# Patient Record
Sex: Female | Born: 1960
Health system: Southern US, Community
[De-identification: ages and names within clinical notes are randomized; demographics above are authoritative.]

## PROBLEM LIST (undated history)

## (undated) DIAGNOSIS — K219 Gastro-esophageal reflux disease without esophagitis: Secondary | ICD-10-CM

## (undated) DIAGNOSIS — M1990 Unspecified osteoarthritis, unspecified site: Secondary | ICD-10-CM

## (undated) DIAGNOSIS — G8929 Other chronic pain: Secondary | ICD-10-CM

## (undated) DIAGNOSIS — D689 Coagulation defect, unspecified: Secondary | ICD-10-CM

## (undated) DIAGNOSIS — M549 Dorsalgia, unspecified: Secondary | ICD-10-CM

## (undated) DIAGNOSIS — R531 Weakness: Secondary | ICD-10-CM

## (undated) DIAGNOSIS — F419 Anxiety disorder, unspecified: Secondary | ICD-10-CM

## (undated) DIAGNOSIS — I10 Essential (primary) hypertension: Secondary | ICD-10-CM

## (undated) DIAGNOSIS — G4700 Insomnia, unspecified: Secondary | ICD-10-CM

## (undated) DIAGNOSIS — D649 Anemia, unspecified: Secondary | ICD-10-CM

## (undated) DIAGNOSIS — E785 Hyperlipidemia, unspecified: Secondary | ICD-10-CM

## (undated) DIAGNOSIS — F32A Depression, unspecified: Secondary | ICD-10-CM

## (undated) DIAGNOSIS — K449 Diaphragmatic hernia without obstruction or gangrene: Secondary | ICD-10-CM

## (undated) DIAGNOSIS — Z8709 Personal history of other diseases of the respiratory system: Secondary | ICD-10-CM

## (undated) DIAGNOSIS — M2550 Pain in unspecified joint: Secondary | ICD-10-CM

## (undated) DIAGNOSIS — M797 Fibromyalgia: Secondary | ICD-10-CM

## (undated) DIAGNOSIS — R0609 Other forms of dyspnea: Secondary | ICD-10-CM

## (undated) DIAGNOSIS — Z923 Personal history of irradiation: Secondary | ICD-10-CM

## (undated) DIAGNOSIS — C50919 Malignant neoplasm of unspecified site of unspecified female breast: Secondary | ICD-10-CM

## (undated) DIAGNOSIS — Z86718 Personal history of other venous thrombosis and embolism: Secondary | ICD-10-CM

## (undated) DIAGNOSIS — M2540 Effusion, unspecified joint: Secondary | ICD-10-CM

## (undated) HISTORY — DX: Coagulation defect, unspecified: D68.9

## (undated) HISTORY — PX: SHOULDER ARTHROSCOPY: SHX128

## (undated) HISTORY — PX: BREAST BIOPSY: SHX20

## (undated) HISTORY — PX: HERNIA REPAIR: SHX51

## (undated) HISTORY — DX: Fibromyalgia: M79.7

## (undated) HISTORY — PX: BREAST LUMPECTOMY: SHX2

---

## 1997-09-18 ENCOUNTER — Ambulatory Visit (HOSPITAL_COMMUNITY): Admission: RE | Admit: 1997-09-18 | Discharge: 1997-09-18 | Payer: Self-pay | Admitting: Internal Medicine

## 1997-12-28 ENCOUNTER — Other Ambulatory Visit: Admission: RE | Admit: 1997-12-28 | Discharge: 1997-12-28 | Payer: Self-pay | Admitting: Obstetrics and Gynecology

## 1999-01-16 ENCOUNTER — Other Ambulatory Visit: Admission: RE | Admit: 1999-01-16 | Discharge: 1999-01-16 | Payer: Self-pay | Admitting: Obstetrics and Gynecology

## 1999-03-18 ENCOUNTER — Encounter: Payer: Self-pay | Admitting: Emergency Medicine

## 1999-03-18 ENCOUNTER — Emergency Department (HOSPITAL_COMMUNITY): Admission: EM | Admit: 1999-03-18 | Discharge: 1999-03-18 | Payer: Self-pay | Admitting: Emergency Medicine

## 2000-06-05 ENCOUNTER — Emergency Department (HOSPITAL_COMMUNITY): Admission: EM | Admit: 2000-06-05 | Discharge: 2000-06-05 | Payer: Self-pay | Admitting: *Deleted

## 2000-09-07 ENCOUNTER — Inpatient Hospital Stay (HOSPITAL_COMMUNITY): Admission: AD | Admit: 2000-09-07 | Discharge: 2000-09-07 | Payer: Self-pay | Admitting: Obstetrics & Gynecology

## 2002-01-13 ENCOUNTER — Ambulatory Visit (HOSPITAL_BASED_OUTPATIENT_CLINIC_OR_DEPARTMENT_OTHER): Admission: RE | Admit: 2002-01-13 | Discharge: 2002-01-13 | Payer: Self-pay | Admitting: Orthopedic Surgery

## 2002-01-13 HISTORY — PX: GANGLION CYST EXCISION: SHX1691

## 2002-02-14 ENCOUNTER — Other Ambulatory Visit: Admission: RE | Admit: 2002-02-14 | Discharge: 2002-02-14 | Payer: Self-pay | Admitting: Obstetrics and Gynecology

## 2003-03-15 ENCOUNTER — Encounter: Admission: RE | Admit: 2003-03-15 | Discharge: 2003-03-15 | Payer: Self-pay | Admitting: Orthopedic Surgery

## 2003-05-10 ENCOUNTER — Other Ambulatory Visit: Admission: RE | Admit: 2003-05-10 | Discharge: 2003-05-10 | Payer: Self-pay | Admitting: Obstetrics and Gynecology

## 2003-05-30 ENCOUNTER — Ambulatory Visit (HOSPITAL_COMMUNITY): Admission: RE | Admit: 2003-05-30 | Discharge: 2003-05-30 | Payer: Self-pay | Admitting: Orthopedic Surgery

## 2003-06-01 ENCOUNTER — Encounter: Admission: RE | Admit: 2003-06-01 | Discharge: 2003-08-08 | Payer: Self-pay | Admitting: Orthopedic Surgery

## 2004-04-08 ENCOUNTER — Inpatient Hospital Stay (HOSPITAL_COMMUNITY): Admission: RE | Admit: 2004-04-08 | Discharge: 2004-04-12 | Payer: Self-pay | Admitting: Orthopedic Surgery

## 2004-04-08 ENCOUNTER — Ambulatory Visit: Payer: Self-pay | Admitting: Physical Medicine & Rehabilitation

## 2004-04-08 HISTORY — PX: TOTAL KNEE ARTHROPLASTY: SHX125

## 2004-08-27 ENCOUNTER — Inpatient Hospital Stay (HOSPITAL_COMMUNITY): Admission: AD | Admit: 2004-08-27 | Discharge: 2004-08-27 | Payer: Self-pay | Admitting: *Deleted

## 2004-08-28 ENCOUNTER — Encounter: Admission: RE | Admit: 2004-08-28 | Discharge: 2004-08-28 | Payer: Self-pay | Admitting: *Deleted

## 2005-10-08 ENCOUNTER — Other Ambulatory Visit: Admission: RE | Admit: 2005-10-08 | Discharge: 2005-10-08 | Payer: Self-pay | Admitting: Obstetrics and Gynecology

## 2006-01-16 ENCOUNTER — Encounter: Admission: RE | Admit: 2006-01-16 | Discharge: 2006-01-16 | Payer: Self-pay | Admitting: Obstetrics and Gynecology

## 2006-04-23 ENCOUNTER — Encounter: Admission: RE | Admit: 2006-04-23 | Discharge: 2006-07-22 | Payer: Self-pay | Admitting: Sports Medicine

## 2006-06-17 ENCOUNTER — Encounter: Admission: RE | Admit: 2006-06-17 | Discharge: 2006-06-17 | Payer: Self-pay | Admitting: Sports Medicine

## 2006-07-01 ENCOUNTER — Encounter: Admission: RE | Admit: 2006-07-01 | Discharge: 2006-07-01 | Payer: Self-pay | Admitting: Sports Medicine

## 2006-07-31 ENCOUNTER — Encounter: Admission: RE | Admit: 2006-07-31 | Discharge: 2006-07-31 | Payer: Self-pay | Admitting: Obstetrics and Gynecology

## 2007-02-11 ENCOUNTER — Emergency Department (HOSPITAL_COMMUNITY): Admission: EM | Admit: 2007-02-11 | Discharge: 2007-02-11 | Payer: Self-pay | Admitting: Emergency Medicine

## 2007-02-17 ENCOUNTER — Emergency Department (HOSPITAL_COMMUNITY): Admission: EM | Admit: 2007-02-17 | Discharge: 2007-02-17 | Payer: Self-pay | Admitting: Emergency Medicine

## 2007-02-23 ENCOUNTER — Emergency Department (HOSPITAL_COMMUNITY): Admission: EM | Admit: 2007-02-23 | Discharge: 2007-02-23 | Payer: Self-pay | Admitting: Emergency Medicine

## 2007-12-29 ENCOUNTER — Ambulatory Visit (HOSPITAL_COMMUNITY): Admission: RE | Admit: 2007-12-29 | Discharge: 2007-12-29 | Payer: Self-pay | Admitting: Orthopaedic Surgery

## 2007-12-29 ENCOUNTER — Encounter (INDEPENDENT_AMBULATORY_CARE_PROVIDER_SITE_OTHER): Payer: Self-pay | Admitting: Orthopaedic Surgery

## 2007-12-29 ENCOUNTER — Ambulatory Visit: Payer: Self-pay | Admitting: Vascular Surgery

## 2007-12-30 ENCOUNTER — Encounter: Admission: RE | Admit: 2007-12-30 | Discharge: 2007-12-30 | Payer: Self-pay | Admitting: Family Medicine

## 2008-01-06 ENCOUNTER — Ambulatory Visit (HOSPITAL_BASED_OUTPATIENT_CLINIC_OR_DEPARTMENT_OTHER): Admission: RE | Admit: 2008-01-06 | Discharge: 2008-01-06 | Payer: Self-pay | Admitting: Orthopedic Surgery

## 2008-09-05 ENCOUNTER — Encounter: Admission: RE | Admit: 2008-09-05 | Discharge: 2008-09-05 | Payer: Self-pay | Admitting: Obstetrics and Gynecology

## 2009-01-28 ENCOUNTER — Emergency Department (HOSPITAL_COMMUNITY): Admission: EM | Admit: 2009-01-28 | Discharge: 2009-01-28 | Payer: Self-pay | Admitting: Family Medicine

## 2009-02-12 ENCOUNTER — Encounter: Admission: RE | Admit: 2009-02-12 | Discharge: 2009-02-12 | Payer: Self-pay | Admitting: Obstetrics and Gynecology

## 2010-02-20 ENCOUNTER — Encounter: Admission: RE | Admit: 2010-02-20 | Discharge: 2010-02-20 | Payer: Self-pay | Admitting: Obstetrics and Gynecology

## 2010-06-02 ENCOUNTER — Encounter: Payer: Self-pay | Admitting: Obstetrics and Gynecology

## 2010-06-02 ENCOUNTER — Encounter: Payer: Self-pay | Admitting: Sports Medicine

## 2010-06-03 ENCOUNTER — Encounter: Payer: Self-pay | Admitting: Family Medicine

## 2010-07-29 ENCOUNTER — Inpatient Hospital Stay (INDEPENDENT_AMBULATORY_CARE_PROVIDER_SITE_OTHER)
Admission: RE | Admit: 2010-07-29 | Discharge: 2010-07-29 | Disposition: A | Discharge status: Home / Self Care | Payer: Self-pay | Source: Ambulatory Visit | Attending: Family Medicine | Admitting: Family Medicine

## 2010-07-29 DIAGNOSIS — J069 Acute upper respiratory infection, unspecified: Secondary | ICD-10-CM

## 2010-08-16 LAB — DIFFERENTIAL
Eosinophils Absolute: 0.1 10*3/uL (ref 0.0–0.7)
Lymphs Abs: 3.1 10*3/uL (ref 0.7–4.0)
Monocytes Absolute: 0.9 10*3/uL (ref 0.1–1.0)
Monocytes Relative: 8 % (ref 3–12)
Neutrophils Relative %: 61 % (ref 43–77)

## 2010-08-16 LAB — POCT I-STAT, CHEM 8
BUN: 20 mg/dL (ref 6–23)
Chloride: 105 mEq/L (ref 96–112)
Creatinine, Ser: 1 mg/dL (ref 0.4–1.2)
Glucose, Bld: 77 mg/dL (ref 70–99)
Hemoglobin: 12.6 g/dL (ref 12.0–15.0)
Potassium: 4 mEq/L (ref 3.5–5.1)
Sodium: 137 mEq/L (ref 135–145)

## 2010-08-16 LAB — CBC
Hemoglobin: 11.8 g/dL — ABNORMAL LOW (ref 12.0–15.0)
MCHC: 33.1 g/dL (ref 30.0–36.0)
MCV: 93.2 fL (ref 78.0–100.0)
RBC: 3.82 MIL/uL — ABNORMAL LOW (ref 3.87–5.11)
WBC: 11.1 10*3/uL — ABNORMAL HIGH (ref 4.0–10.5)

## 2010-09-24 NOTE — Op Note (Signed)
NAMEMAICIE, VANDERLOOP NO.:  [id number]   MEDICAL RECORD NO.:  [id number]          PATIENT TYPE:  AMB   LOCATION:  DSC                          FACILITY:  [facility]   PHYSICIAN:  Loreta Ave, M.D. DATE OF BIRTH:  28-Dec-1960   DATE OF PROCEDURE:  01/06/2008  DATE OF DISCHARGE:                               OPERATIVE REPORT   PREOPERATIVE DIAGNOSIS:  Left carpal tunnel syndrome.   POSTOPERATIVE DIAGNOSIS:  Left carpal tunnel syndrome.   PROCEDURE:  Left carpal tunnel release.   SURGEON:  Loreta Ave, MD   ASSISTANT:  Genene Churn. Barry Dienes, [place]   ANESTHESIA:  General.   BLOOD LOSS:  Minimal.   SPECIMENS:  None.   CULTURES:  None.   COMPLICATIONS:  None.   DRESSING:  Soft compressive with splint.   PROCEDURE:  The patient was brought to the operating room and placed on  operating table in supine position.  After adequate anesthesia had been  obtained, tourniquet applied to the upper aspect of the arm.  Prepped  and draped in usual sterile fashion.  Exsanguinated with elevation and  Esmarch.  Tourniquet inflated to 250 mmHg.  A longitudinal incision over  the carpal tunnel heading slightly ulnarward at the wrist crease.  Skin  and subcutaneous tissue divided.  Retinaculum over the carpal tunnel  identified and incised under direct visualization from the forearm  fascia proximally to the palmar arch distally.  Nerve identified,  completely decompressed.  Digital branch and motor branches identified  and all decompressed.  No other abnormalities in the canal.  Relatively  tight, but the nerve itself had fairly good appearance.  Wound  irrigated.  Skin closed with nylon.  Sterile compressive dressing  applied with a splint.  Tourniquet deflated, removed.  Anesthesia  reversed.  Brought to the recovery room.  Tolerated surgery well.  No  complications.     Loreta Ave, M.D.  Electronically Signed    DFM/MEDQ  D:  01/06/2008  T:  01/07/2008  Job:   [id number]

## 2010-09-27 NOTE — Discharge Summary (Signed)
[name], AGOSTINELLI NO.:  [id number]   MEDICAL RECORD NO.:  [id number]          PATIENT TYPE:  INP   LOCATION:  5035                         FACILITY:  [facility]   PHYSICIAN:  Genene Churn. Barry Dienes, P.A.   DATE OF BIRTH:  1960-10-08   DATE OF ADMISSION:  04/08/2004  DATE OF DISCHARGE:  04/12/2004                           DISCHARGE SUMMARY - REFERRING   FINAL DIAGNOSES:  1.  Status post left total knee replacement for osteoarthritis and chronic      pain.  2.  Long-term use of anticoagulants.   SURGEON:  Loreta Ave, M.D.   HISTORY OF PRESENT ILLNESS:  A 50 year old white female with history of left  knee osteoarthritis and chronic pain presented to our office for  preoperative evaluation for left total knee replacement.  She had  progressively worsening pain with failed response to conservative treatment.  Significant decrease in her daily activities due to the ongoing complaint.  Preoperative x-ray showed a 50% decrease in joint space, height medial  compartment and patellofemoral joint.   PREADMISSION LABS:  A wbc was 10.1, rbc 4.14, hemoglobin 12.6, hematocrit  37.0, platelets 336.  PT 12.5, INR 0.9, PTT 27.  Sodium 136, potassium 4.5,  chloride 106, CO2 25, glucose 82, BUN 10, creatinine 0.9, calcium 9.2, total  protein 6.6, albumin 3.7, AST 16, ALT 15, alkaline phosphatase 72, total  bilirubin 0.5.   HOSPITAL COURSE:  April 09, 2004, the patient was taken to the [facility] Operating Room and a left total knee replacement  procedure was performed.  Surgeon, Loreta Ave, M.D., and assistant was  Genene Churn. Barry Dienes, P.A.C.  Anesthesia was general. Estimated blood loss was  minimal.  Tourniquet time was 80 minutes.  There were no surgical or  anesthesia complications and the patient was transferred to recovery in  stable condition.   On April 09, 2004, the patient started on pharmacy protocol Coumadin.  Complained of nausea and  knee pain.  Temperature 98.6, pulse 99, respiratory  rate 20, blood pressure 106/70.  T-max 101.  Hemoglobin 9.3.  INR 1.1, PT  13.9.  Lytes stable, glucose 125.  Dressing bloody.  She is neurovascularly  intact.  Encouraged incentive spirometer.  In the chair b.i.d. with  assistance.  Morphine and O2 discontinued.   April 10, 2004, patient complained of a nonproductive cough.  Question of  some post nasal drainage.  No chest pain or shortness of breath.  Temperature 99.2, pulse 112, respiratory rate 20, blood pressure 115/70.  Hemoglobin 9.3.  INR 1.5.  Lytes stable.  Wound looked good.  Staples  intact.  No signs of infection.  Calf nontender.  Neurovascularly intact.  Hemovac drain pulled.  Foley discontinued and IV heplocked.  Chest x-ray  ordered due to patient's cough.  Discontinued Percocet and Dilaudid.  Started on Mepergan forte one tablet p.o. q.4-6h. p.r.n.   April 11, 2004, the patient continued to complain of some knee pain.  Cough improved.  Temperature 100.2, pulse 102, respiratory rate 20, blood  pressure 113/69.  Potassium 3.1.  Hemoglobin 9.5.  INR 1.7.  Chest x-ray  showed no acute disease.  Wound looked good.  Staples intact.  No signs of  infection.  Calf nontender.  Neurovascularly intact.  KCl 20 mEq p.o. x1  day.  DCIV.  Patient slow moving with physical therapy and anticipated short  rehab stay.  Check UA, urine C&S.   April 12, 2004, the patient doing better.  Ambulated down the hall.  Temperature 99.2, pulse 96, respiratory rate 20, blood pressure 129/74.  UA  negative showing trace ketones.  INR 1.5.  Hemoglobin 9.1.  Calcium 3.3.  Wound looked good.  No signs of infection.  Patient stable and ready for  discharge home.   DISCHARGE MEDICATIONS:  1.  Mepergan forte one tablet p.o. q.4-6h. p.r.n. for pain.  2.  Skelaxin 800 mg one tablet p.o. q.12h. p.r.n. spasms.  3.  Coumadin pharmacy protocol.   CONDITION:  Good and stable.   DISPOSITION:   Discharge home.   DISCHARGE INSTRUCTIONS:  The patient have work with Home Health PT to  improve knee range of motion and strengthening.  Coumadin for DVT  prophylaxis postoperatively x3 or 4 weeks. Staples to be removed two weeks  postoperatively.  Dressing changes p.r.n.  She will follow up in my office  in two weeks from the date of her surgery for recheck.  Will return sooner  p.r.n.       JMO/MEDQ  D:  05/31/2004  T:  05/31/2004  Job:  [id number]

## 2010-09-27 NOTE — Op Note (Signed)
   [name], [name]                             ACCOUNT NO.:  [id number]   MEDICAL RECORD NO.:  [id number]                   PATIENT TYPE:  AMB   LOCATION:  DSC                                  FACILITY:  [facility]   PHYSICIAN:  Loreta Ave, M.D.              DATE OF BIRTH:  1961-04-01   DATE OF PROCEDURE:  01/13/2002  DATE OF DISCHARGE:                                 OPERATIVE REPORT   PREOPERATIVE DIAGNOSES:  Volar ganglion, symptomatic, left wrist.   POSTOPERATIVE DIAGNOSES:  Volar ganglion, symptomatic, left wrist.   OPERATIVE PROCEDURE:  Excision of volar ganglion, left wrist.   SURGEON:  Loreta Ave, M.D.   ASSISTANT:  Arlys John D. Petrarca, P.A.-C.   ANESTHESIA:  General.   ESTIMATED BLOOD LOSS:  Minimal.   TOURNIQUET TIME:  30 minutes.   SPECIMENS:  None.   CULTURES:  None.   COMPLICATIONS:  None.   DRESSINGS:  Soft compressive with bulky hand dressing and splint.   PROCEDURE:  The patient was brought to the operating room and placed on the  operating table in the supine position.  After adequate anesthesia had been  obtained, the left arm was prepped and draped in the usual sterile fashion  with a tourniquet in place.  Exsanguinated with elevation of an Esmarch.  The tourniquet was inflated to 250 mmHg.  The ganglion which was at the  volar radial aspect of the wrist was approached with a longitudinal  incision, curving around the ganglion itself.  The skin and subcutaneous  tissue were divided. Ganglion was evident and dissected down to the volar  wrist capsule, excised in its entirity, including a smalentirety in the  capsule.  The radial artery was protected throughout and maintained intact.  No other abnormalities were seen except for another small satellite ganglion  just adjacent to the original one.  This was excised as well.  The wound was  irrigated.  The skin was closed with nylon.  The margins were injected with  Marcaine.  A sterile compressive  dressing was applied with a bulky hand  dressing and splint.  The tourniquet was inflated and removed.  He tolerated  surgery well without complications.                                               Loreta Ave, M.D.   DFM/MEDQ  D:  01/13/2002  T:  01/14/2002  Job:  [id number]

## 2010-09-27 NOTE — Op Note (Signed)
NAMEJALYSA, SWOPES NO.:  [id number]   MEDICAL RECORD NO.:  [id number]          PATIENT TYPE:  INP   LOCATION:  5035                         FACILITY:  [facility]   PHYSICIAN:  Loreta Ave, M.D. DATE OF BIRTH:  1960/12/25   DATE OF PROCEDURE:  04/08/2004  DATE OF DISCHARGE:                                 OPERATIVE REPORT   PREOPERATIVE DIAGNOSIS:  End-stage degenerative arthritis, left knee.   POSTOPERATIVE DIAGNOSIS:  End-stage degenerative arthritis, left knee.   OPERATION PERFORMED:  Left total knee replacement, [employer] prosthesis,  cemented #7 posterior stabilizer femoral component, cemented #5 tibial  component with a 10-millimeter posterior stabilizer Flex insert, cemented  recessed 26-millimeter patellar component.   SURGEON:  Loreta Ave, M.D.   ASSISTANT:  Genene Churn. Owens, P. A.   ANESTHESIA:  General.   ESTIMATED BLOOD LOSS:  Blood loss is minimal.   TOURNIQUET TIME:  One hour 15 minutes.   SPECIMENS:  Excised bone and soft tissue.   CULTURES:  None.   COMPLICATIONS:  None.   DRESSINGS:  Soft compressive.   DRAINS:  Hemovac times two.   DESCRIPTION OF OPERATION:  The patient was brought to the operating room and  after adequate anesthesia was obtained the left knee was examined.  Slight  hyperextension, flexion to 125 degrees.  Stable ligaments.   The tourniquet was applied.  The patient was prepped and draped in the usual  sterile fashion.  The limb was exsanguinated with elevation and an Esmarch.  The tourniquet was inflated to 350 mmHg.  A straight incision was made above  the patella and carried down to the tibial tubercle.  A median parapatellar  arthrotomy performed.  Hemostasis ws with cautery.  The knee was exposed.  Grade 4 changes were noted medially.  There were some focal grade 3 changes  in the other two compartments.  Periarticular spurs, loose bodies, remnants  of menisci and cruciate ligament were removed.  The  distal femur was  exposed.  Intramedullary guide was placed.  A distal cut was made removing  10 mm.  Knee was set five degrees of valgus; sized for a #7 component.  Jig  was put in place.  Significant cuts were made.   Attention was turned to the tibia.  The tibial spur was removed with a saw.  The intramedullary guide was put into place.  A proximal cut was made, 5-  degree  posterior slope removing 6 mm off the deficit medial side.  It was  sized for a #5 component.  Patellar was sized, reamed and drilled for a 26  mm patellar component.  Trial was put in place; #7 on the femur, #5 on the  tibia and a 26 mm on the patella with a 10-mm polyethylene insert.  Full  extension, full flexion and after a medial capsular release I had a nicely  balanced knee with no lift off in flexion and good stability in flexion  extension.  The tibia was marked for rotation and then hand reamed.   All trials were removed.  Copious irrigation with a pulse irrigating device  was done.  Cement was prepared and placed on all components, which were  firmly seated.  Excess cement was removed.  Polyethylene was attached to the  tibia.  The knee was reduced.  Once the knee had been allowed to stay until  the cement hardened.  It was reexamined.  Full extension, flexion and no  lift off in flexion was noted.  Good patellofemoral tracking was noted.  Hemovacs were placed and brought out through a  separate stab wound.   Arthrotomy was closed with #1 Vicryl, skin and subcutaneous tissue with  Vicryl and staples.  The knee was injected with Marcaine.  Hemovacs were  clamped.  A sterile compressive dressing applied.   The tourniquet and inflator removed.  Knee immobilizer was applied.   Anesthesia was reversed and the patient was taken to the recovery room.  The  patient tolerated the surgery well with no complications.      Valentino Saxon   DFM/MEDQ  D:  04/08/2004  T:  04/09/2004  Job:  [id number]

## 2011-03-04 ENCOUNTER — Other Ambulatory Visit: Payer: Self-pay | Admitting: Obstetrics and Gynecology

## 2011-03-04 DIAGNOSIS — Z1231 Encounter for screening mammogram for malignant neoplasm of breast: Secondary | ICD-10-CM

## 2011-03-10 ENCOUNTER — Ambulatory Visit
Admission: RE | Admit: 2011-03-10 | Discharge: 2011-03-10 | Disposition: A | Discharge status: Home / Self Care | Payer: Self-pay | Source: Ambulatory Visit | Attending: Obstetrics and Gynecology | Admitting: Obstetrics and Gynecology

## 2011-03-10 DIAGNOSIS — Z1231 Encounter for screening mammogram for malignant neoplasm of breast: Secondary | ICD-10-CM

## 2012-10-27 ENCOUNTER — Emergency Department (INDEPENDENT_AMBULATORY_CARE_PROVIDER_SITE_OTHER): Payer: Self-pay

## 2012-10-27 ENCOUNTER — Encounter (HOSPITAL_COMMUNITY): Payer: Self-pay

## 2012-10-27 ENCOUNTER — Emergency Department (HOSPITAL_COMMUNITY)
Admission: EM | Admit: 2012-10-27 | Discharge: 2012-10-27 | Disposition: A | Discharge status: Home / Self Care | Payer: Self-pay | Source: Home / Self Care | Attending: Family Medicine | Admitting: Family Medicine

## 2012-10-27 DIAGNOSIS — J019 Acute sinusitis, unspecified: Secondary | ICD-10-CM

## 2012-10-27 MED ORDER — HYDROCOD POLST-CHLORPHEN POLST 10-8 MG/5ML PO LQCR: 5.0000 mL | Freq: Two times a day (BID) | ORAL | Status: DC | PRN

## 2012-10-27 MED ORDER — FLUTICASONE PROPIONATE 50 MCG/ACT NA SUSP: 1.0000 | Freq: Two times a day (BID) | NASAL | Status: DC

## 2012-10-27 MED ORDER — AMOXICILLIN-POT CLAVULANATE 875-125 MG PO TABS: 1.0000 | ORAL_TABLET | Freq: Two times a day (BID) | ORAL | Status: DC

## 2012-10-27 NOTE — ED Provider Notes (Signed)
History     CSN: [id number]  Arrival date & time 10/27/12  1611   First MD Initiated Contact with Patient 10/27/12 1656      Chief Complaint  Patient presents with  . Cough    (Consider location/radiation/quality/duration/timing/severity/associated sxs/prior treatment) Patient is a 52 y.o. female presenting with cough. The history is provided by the patient.  Cough Cough characteristics:  Non-productive Severity:  Moderate Duration:  4 days Timing:  Constant Progression:  Unchanged Chronicity:  New Smoker: no   Relieved by:  None tried Worsened by:  Nothing tried Ineffective treatments:  None tried Associated symptoms: chills and rhinorrhea   Associated symptoms: no fever, no shortness of breath and no wheezing     History reviewed. No pertinent past medical history.  History reviewed. No pertinent past surgical history.  History reviewed. No pertinent family history.  History  Substance Use Topics  . Smoking status: Not on file  . Smokeless tobacco: Not on file  . Alcohol Use: Not on file    OB History   Grav Para Term Preterm Abortions TAB SAB Ect Mult Living                  Review of Systems  Constitutional: Positive for chills. Negative for fever.  HENT: Positive for rhinorrhea.   Respiratory: Positive for cough. Negative for shortness of breath and wheezing.   Cardiovascular: Negative.   Gastrointestinal: Negative.   Genitourinary: Negative.     Allergies  Review of patient's allergies indicates not on file.  Home Medications   Current Outpatient Rx  Name  Route  Sig  Dispense  Refill  . amoxicillin-clavulanate (AUGMENTIN) 875-125 MG per tablet   Oral   Take 1 tablet by mouth 2 (two) times daily.   20 tablet   0   . chlorpheniramine-HYDROcodone (TUSSIONEX PENNKINETIC ER) 10-8 MG/5ML LQCR   Oral   Take 5 mLs by mouth every 12 (twelve) hours as needed. For cough   115 mL   0   . fluticasone (FLONASE) 50 MCG/ACT nasal spray   Nasal  Place 1 spray into the nose 2 (two) times daily.   1 g   2     BP 147/71  Pulse 93  Temp(Src) 98.4 F (36.9 C) (Oral)  SpO2 95%  Physical Exam  Nursing note and vitals reviewed. Constitutional: She is oriented to person, place, and time. She appears well-developed and well-nourished.  HENT:  Head: Normocephalic.  Right Ear: External ear normal.  Left Ear: External ear normal.  Nose: Mucosal edema and rhinorrhea present.  Mouth/Throat: Oropharynx is clear and moist.  Eyes: Conjunctivae are normal. Pupils are equal, round, and reactive to light.  Neck: Normal range of motion. Neck supple.  Cardiovascular: Normal rate, normal heart sounds and intact distal pulses.   Pulmonary/Chest: Effort normal and breath sounds normal.  Abdominal: Soft. Bowel sounds are normal. There is no tenderness.  Lymphadenopathy:    She has no cervical adenopathy.  Neurological: She is alert and oriented to person, place, and time.  Skin: Skin is warm and dry.    ED Course  Procedures (including critical care time)  Labs Reviewed - No data to display Dg Chest 2 View  10/27/2012   *RADIOLOGY REPORT*  Clinical Data: 52 year old female with cough.  Difficulty talking.  CHEST - 2 VIEW  Comparison: 04/10/2004.And earlier.  Findings: Lung volumes are stable within normal limits.  Cardiac size and mediastinal contours are within normal limits.  Visualized tracheal air  column is within normal limits.  No pneumothorax, pulmonary edema, pleural effusion or confluent pulmonary opacity. No acute osseous abnormality identified.  IMPRESSION: No acute cardiopulmonary abnormality.   Original Report Authenticated By: Erskine Speed, M.D.     1. Sinusitis, acute       MDM  X-rays reviewed and report per radiologist.         Linna Hoff, MD 10/27/12 [phone]

## 2012-10-27 NOTE — ED Notes (Signed)
States she has been having a cough since woke up early Saturday AM. C/o has no appetite, chest sore from coughing

## 2012-10-29 NOTE — ED Notes (Signed)
Accessed chart for pharmacy-[facility]- tussionex is too expensive, pharmacy requesting to substitute hydromet, dr Alfonse Ras agreed to pharmacy substitution

## 2013-02-09 ENCOUNTER — Other Ambulatory Visit: Payer: Self-pay | Admitting: Obstetrics and Gynecology

## 2013-02-09 DIAGNOSIS — Z1231 Encounter for screening mammogram for malignant neoplasm of breast: Secondary | ICD-10-CM

## 2013-03-01 ENCOUNTER — Encounter (HOSPITAL_COMMUNITY): Payer: Self-pay

## 2013-03-01 ENCOUNTER — Ambulatory Visit (HOSPITAL_COMMUNITY)
Admission: RE | Admit: 2013-03-01 | Discharge: 2013-03-01 | Disposition: A | Discharge status: Home / Self Care | Payer: No Typology Code available for payment source | Source: Ambulatory Visit | Attending: Obstetrics and Gynecology | Admitting: Obstetrics and Gynecology

## 2013-03-01 VITALS — BP 132/94 | Temp 98.2°F | Ht 64.0 in | Wt 209.4 lb

## 2013-03-01 DIAGNOSIS — Z1231 Encounter for screening mammogram for malignant neoplasm of breast: Secondary | ICD-10-CM

## 2013-03-01 DIAGNOSIS — Z01419 Encounter for gynecological examination (general) (routine) without abnormal findings: Secondary | ICD-10-CM

## 2013-03-01 NOTE — Patient Instructions (Signed)
Taught [name] how to perform BSE and gave educational materials to take home. Let her know [facility] will cover Pap smears every 3 years unless has a history of abnormal Pap smears. Let patient know will follow up with her within the next couple weeks with results by letter or phone. Terrence Dupont Hemrick verbalized understanding. Patient escorted to mammography for a screening mammogram.  Najma Bozarth, Kathaleen Maser, RN 3:52 PM

## 2013-03-01 NOTE — Progress Notes (Signed)
No complaints today.  Pap Smear:    Pap smear completed today. Patients last Pap smear was 6 years ago and normal per patient. Per patient has no history of an abnormal Pap smear. No Pap smear results in EPIC.  Physical exam: Breasts Breasts symmetrical. No skin abnormalities bilateral breasts. No nipple retraction bilateral breasts. No nipple discharge bilateral breasts. No lymphadenopathy. No lumps palpated bilateral breasts. No complaints of pain or tenderness on exam. Patient escorted to mammography for a screening mammogram.         Pelvic/Bimanual   Ext Genitalia No lesions, no swelling and no discharge observed on external genitalia.         Vagina Vagina pink and normal texture. No lesions or discharge observed in vagina.          Cervix Cervix is present. Cervix pink and of normal texture. No discharge observed.     Uterus Uterus is present and palpable. Uterus in normal position and normal size.        Adnexae Bilateral ovaries present and palpable. No tenderness on palpation.          Rectovaginal No rectal exam completed today since patient had no rectal complaints. No skin abnormalities observed on exam.

## 2013-03-07 ENCOUNTER — Other Ambulatory Visit: Payer: Self-pay | Admitting: Obstetrics and Gynecology

## 2013-03-07 DIAGNOSIS — R928 Other abnormal and inconclusive findings on diagnostic imaging of breast: Secondary | ICD-10-CM

## 2013-03-11 ENCOUNTER — Telehealth (HOSPITAL_COMMUNITY): Payer: Self-pay | Admitting: *Deleted

## 2013-03-11 NOTE — Telephone Encounter (Signed)
Telephoned patient at home # and discussed negative pap smear results. Next pap smear due in 3 years. Patient voiced understanding.  

## 2013-03-25 ENCOUNTER — Ambulatory Visit
Admission: RE | Admit: 2013-03-25 | Discharge: 2013-03-25 | Disposition: A | Discharge status: Home / Self Care | Payer: No Typology Code available for payment source | Source: Ambulatory Visit | Attending: Obstetrics and Gynecology | Admitting: Obstetrics and Gynecology

## 2013-03-25 ENCOUNTER — Other Ambulatory Visit: Payer: Self-pay | Admitting: Obstetrics and Gynecology

## 2013-03-25 DIAGNOSIS — R928 Other abnormal and inconclusive findings on diagnostic imaging of breast: Secondary | ICD-10-CM

## 2013-03-28 ENCOUNTER — Other Ambulatory Visit: Payer: Self-pay

## 2013-03-28 ENCOUNTER — Other Ambulatory Visit: Payer: Self-pay | Admitting: Obstetrics and Gynecology

## 2013-03-28 DIAGNOSIS — R928 Other abnormal and inconclusive findings on diagnostic imaging of breast: Secondary | ICD-10-CM

## 2013-03-29 ENCOUNTER — Ambulatory Visit
Admission: RE | Admit: 2013-03-29 | Discharge: 2013-03-29 | Disposition: A | Discharge status: Home / Self Care | Payer: No Typology Code available for payment source | Source: Ambulatory Visit | Attending: Obstetrics and Gynecology | Admitting: Obstetrics and Gynecology

## 2013-03-29 DIAGNOSIS — R928 Other abnormal and inconclusive findings on diagnostic imaging of breast: Secondary | ICD-10-CM

## 2013-04-13 ENCOUNTER — Telehealth: Payer: Self-pay | Admitting: General Practice

## 2013-04-13 ENCOUNTER — Encounter: Payer: Self-pay | Admitting: Internal Medicine

## 2013-04-13 ENCOUNTER — Ambulatory Visit: Payer: Self-pay | Attending: Internal Medicine | Admitting: Internal Medicine

## 2013-04-13 VITALS — BP 160/93 | HR 78 | Temp 98.5°F | Resp 16 | Ht 64.0 in | Wt 205.0 lb

## 2013-04-13 DIAGNOSIS — R0989 Other specified symptoms and signs involving the circulatory and respiratory systems: Secondary | ICD-10-CM | POA: Insufficient documentation

## 2013-04-13 DIAGNOSIS — R0600 Dyspnea, unspecified: Secondary | ICD-10-CM | POA: Insufficient documentation

## 2013-04-13 DIAGNOSIS — I10 Essential (primary) hypertension: Secondary | ICD-10-CM | POA: Insufficient documentation

## 2013-04-13 DIAGNOSIS — Z0000 Encounter for general adult medical examination without abnormal findings: Secondary | ICD-10-CM | POA: Insufficient documentation

## 2013-04-13 DIAGNOSIS — K219 Gastro-esophageal reflux disease without esophagitis: Secondary | ICD-10-CM | POA: Insufficient documentation

## 2013-04-13 DIAGNOSIS — R0609 Other forms of dyspnea: Secondary | ICD-10-CM | POA: Insufficient documentation

## 2013-04-13 LAB — CBC WITH DIFFERENTIAL/PLATELET
Basophils Absolute: 0 10*3/uL (ref 0.0–0.1)
Eosinophils Relative: 1 % (ref 0–5)
Lymphocytes Relative: 36 % (ref 12–46)
Lymphs Abs: 2.6 10*3/uL (ref 0.7–4.0)
MCV: 89.8 fL (ref 78.0–100.0)
Monocytes Absolute: 0.8 10*3/uL (ref 0.1–1.0)
Neutro Abs: 3.6 10*3/uL (ref 1.7–7.7)
Neutrophils Relative %: 51 % (ref 43–77)
Platelets: 323 10*3/uL (ref 150–400)
RBC: 4.33 MIL/uL (ref 3.87–5.11)
RDW: 14.6 % (ref 11.5–15.5)
WBC: 7 10*3/uL (ref 4.0–10.5)

## 2013-04-13 MED ORDER — ATENOLOL 25 MG PO TABS: 25.0000 mg | ORAL_TABLET | Freq: Every day | ORAL | Status: DC

## 2013-04-13 MED ORDER — OMEPRAZOLE 40 MG PO CPDR: 40.0000 mg | DELAYED_RELEASE_CAPSULE | Freq: Two times a day (BID) | ORAL | Status: DC

## 2013-04-13 NOTE — Progress Notes (Signed)
Pt is here today to establish care. Pt reports that she is having a hard time eating causing her pain in her chest and arms Pt states that she gets tiered easily.

## 2013-04-13 NOTE — Progress Notes (Signed)
Patient ID: [name], female   DOB: December 01, 1960, 52 y.o.   MRN: [id number] Patient Demographics  [name], is a 52 y.o. female  WUJ:[id number]  [id number]:[id number]  DOB - 1960-11-22  Chief Complaint  Patient presents with  . Establish Care        Subjective:   [name] today is here to establish primary care.  Patient is a 52 year old female with no prior medical history, has not been following any primary physician. Patient had a screening mammogram done, followed by a left breast ultrasound which showed fibroadenoma in the was told to seek medical care as her BP was high. Patient also reports that she's been taking at least 100 TUMS every month for GERD, sometimes she wakes up at night and has shortness of breath. She also feels fatigued easily. Patient has No headache, + intermittent chest pain radiating to left side, No abdominal pain - No Nausea, No new weakness tingling or numbness, No Cough, + intermittent SOB.   Objective:    Filed Vitals:   04/13/13 1200  BP: 160/93  Pulse: 78  Temp: 98.5 F (36.9 C)  TempSrc: Oral  Resp: 16  Height: 5\' 4"  (1.626 m)  Weight: 205 lb (92.987 kg)  SpO2: 99%     ALLERGIES:  No Known Allergies  PAST MEDICAL HISTORY: History reviewed. No pertinent past medical history.  PAST SURGICAL HISTORY: Past Surgical History  Procedure Laterality Date  . Total knee arthroplasty      FAMILY HISTORY: Family History  Problem Relation Age of Onset  . Cancer Father     lung  . Breast cancer Sister   . Cancer Maternal Grandfather     lung  . Cancer Paternal Grandfather     lung    MEDICATIONS AT HOME: Prior to Admission medications   Medication Sig Start Date End Date Taking? Authorizing Provider  amoxicillin-clavulanate (AUGMENTIN) 875-125 MG per tablet Take 1 tablet by mouth 2 (two) times daily. 10/27/12   Linna Hoff, MD  atenolol (TENORMIN) 25 MG tablet Take 1 tablet (25 mg total) by mouth daily. 04/13/13   Anneliese Leblond Jenna Luo, MD   chlorpheniramine-HYDROcodone (TUSSIONEX PENNKINETIC ER) 10-8 MG/5ML LQCR Take 5 mLs by mouth every 12 (twelve) hours as needed. For cough 10/27/12   Linna Hoff, MD  fluticasone (FLONASE) 50 MCG/ACT nasal spray Place 1 spray into the nose 2 (two) times daily. 10/27/12   Linna Hoff, MD  omeprazole (PRILOSEC) 40 MG capsule Take 1 capsule (40 mg total) by mouth 2 (two) times daily. 04/13/13   Treshaun Carrico Jenna Luo, MD    REVIEW OF SYSTEMS:  Constitutional:   No   Fevers, chills, fatigue.  HEENT:    No headaches, Sore throat,   Cardio-vascular: No chest pain,  Orthopnea, swelling in lower extremities, anasarca, palpitations  GI:  No abdominal pain, nausea, vomiting, diarrhea  Resp: No shortness of breath,  No coughing up of blood.No cough.No wheezing.  Skin:  no rash or lesions.  GU:  no dysuria, change in color of urine, no urgency or frequency.  No flank pain.  Musculoskeletal: No joint pain or swelling.  No decreased range of motion.  No back pain.  Psych: No change in mood or affect. No depression or anxiety.  No memory loss.   Exam  General appearance :Awake, alert, NAD, Speech Clear. HEENT: Atraumatic and Normocephalic, PERLA Neck: supple, no JVD. No cervical lymphadenopathy.  Chest: clear to auscultation bilaterally, no wheezing, rales or rhonchi CVS:  S1 S2 regular, no murmurs.  Abdomen: Obese, soft, NBS, NT, ND, no gaurding, rigidity or rebound. Extremities: No cyanosis, clubbing, B/L Lower Ext shows no edema,  Neurology: Awake alert, and oriented X 3, CN II-XII intact, Non focal Skin:No Rash or lesions Wounds: N/A    Data Review   Basic Metabolic Panel: No results found for this basename: NA, K, CL, CO2, GLUCOSE, BUN, CREATININE, CALCIUM, MG, PHOS,  in the last 168 hours Liver Function Tests: No results found for this basename: AST, ALT, ALKPHOS, BILITOT, PROT, ALBUMIN,  in the last 168 hours  CBC: No results found for this basename: WBC, NEUTROABS, HGB,  HCT, MCV, PLT,  in the last 168 hours ------------------------------------------------------------------------------------------------------------------ No results found for this basename: HGBA1C,  in the last 72 hours ------------------------------------------------------------------------------------------------------------------ No results found for this basename: CHOL, HDL, LDLCALC, TRIG, CHOLHDL, LDLDIRECT,  in the last 72 hours ------------------------------------------------------------------------------------------------------------------ No results found for this basename: TSH, T4TOTAL, FREET3, T3FREE, THYROIDAB,  in the last 72 hours ------------------------------------------------------------------------------------------------------------------ No results found for this basename: VITAMINB12, FOLATE, FERRITIN, TIBC, IRON, RETICCTPCT,  in the last 72 hours  Coagulation profile  No results found for this basename: INR, PROTIME,  in the last 168 hours    Assessment & Plan   Active Problems: Patient Active Problem List   Diagnosis Date Noted  . Periodic health assessment, general screening, adult - Patient declined flu shot  - Had a Pap smear in 02/2013 - Mammogram and left breast ultrasound showed fibroadenoma, will follow next year Patient's sister has stage III breast cancer.   04/13/2013  . GERD (gastroesophageal reflux disease) - Started patient on omeprazole 40 mg BID, if no significant improvement, will need a GI referral for endoscopy   04/13/2013  . HTN (hypertension) - Started patient on atenolol 25 mg daily. Will follow him BMET for renal function, may need to change antihypertensives at the next visit.   04/13/2013  . 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 Currently denies any recent long-distance car travels or flights. - Obtain ekg and 2-D echo for further workup - Lipid panel - For now I'm holding  aspirin due to severe GERD  04/13/2013   Recommendations: Follow labs  Follow-up in 2 weeks   Tafari Humiston M.D. 04/13/2013, 12:39 PM

## 2013-04-13 NOTE — Telephone Encounter (Signed)
Returning pt's call, No answer, unable to leave message.

## 2013-04-14 LAB — LIPID PANEL
Cholesterol: 252 mg/dL — ABNORMAL HIGH (ref 0–200)
LDL Cholesterol: 155 mg/dL — ABNORMAL HIGH (ref 0–99)
Total CHOL/HDL Ratio: 4.2 Ratio

## 2013-04-14 LAB — COMPREHENSIVE METABOLIC PANEL
ALT: 23 U/L (ref 0–35)
AST: 19 U/L (ref 0–37)
Albumin: 4.4 g/dL (ref 3.5–5.2)
Alkaline Phosphatase: 75 U/L (ref 39–117)
BUN: 15 mg/dL (ref 6–23)
CO2: 25 mEq/L (ref 19–32)
Calcium: 9.9 mg/dL (ref 8.4–10.5)
Chloride: 104 mEq/L (ref 96–112)
Potassium: 4.5 mEq/L (ref 3.5–5.3)
Sodium: 140 mEq/L (ref 135–145)
Total Protein: 7.1 g/dL (ref 6.0–8.3)

## 2013-04-14 LAB — D-DIMER, QUANTITATIVE: D-Dimer, Quant: 0.28 ug/mL-FEU (ref 0.00–0.48)

## 2013-04-14 LAB — TSH: TSH: 1.747 u[IU]/mL (ref 0.350–4.500)

## 2013-04-22 ENCOUNTER — Ambulatory Visit: Payer: Self-pay

## 2013-04-27 ENCOUNTER — Ambulatory Visit (HOSPITAL_COMMUNITY)
Admission: RE | Admit: 2013-04-27 | Discharge: 2013-04-27 | Disposition: A | Discharge status: Home / Self Care | Payer: No Typology Code available for payment source | Source: Ambulatory Visit | Attending: Internal Medicine | Admitting: Internal Medicine

## 2013-04-27 DIAGNOSIS — R0609 Other forms of dyspnea: Secondary | ICD-10-CM

## 2013-04-27 DIAGNOSIS — R0989 Other specified symptoms and signs involving the circulatory and respiratory systems: Secondary | ICD-10-CM | POA: Insufficient documentation

## 2013-04-27 DIAGNOSIS — K219 Gastro-esophageal reflux disease without esophagitis: Secondary | ICD-10-CM

## 2013-04-27 DIAGNOSIS — R0600 Dyspnea, unspecified: Secondary | ICD-10-CM

## 2013-04-27 DIAGNOSIS — Z0000 Encounter for general adult medical examination without abnormal findings: Secondary | ICD-10-CM

## 2013-04-27 DIAGNOSIS — I10 Essential (primary) hypertension: Secondary | ICD-10-CM

## 2013-04-27 NOTE — Progress Notes (Signed)
  Echocardiogram 2D Echocardiogram has been performed.  Arvil Chaco 04/27/2013, 2:58 PM

## 2013-04-29 ENCOUNTER — Encounter: Payer: Self-pay | Admitting: Internal Medicine

## 2013-04-29 ENCOUNTER — Ambulatory Visit: Payer: Self-pay | Attending: Internal Medicine | Admitting: Internal Medicine

## 2013-04-29 VITALS — BP 174/93 | HR 73 | Temp 97.7°F | Resp 18 | Ht 64.0 in | Wt 204.0 lb

## 2013-04-29 DIAGNOSIS — J3489 Other specified disorders of nose and nasal sinuses: Secondary | ICD-10-CM | POA: Insufficient documentation

## 2013-04-29 DIAGNOSIS — Z23 Encounter for immunization: Secondary | ICD-10-CM

## 2013-04-29 DIAGNOSIS — R05 Cough: Secondary | ICD-10-CM | POA: Insufficient documentation

## 2013-04-29 DIAGNOSIS — Z0000 Encounter for general adult medical examination without abnormal findings: Secondary | ICD-10-CM

## 2013-04-29 DIAGNOSIS — R059 Cough, unspecified: Secondary | ICD-10-CM | POA: Insufficient documentation

## 2013-04-29 MED ORDER — AZITHROMYCIN 250 MG PO TABS: ORAL_TABLET | ORAL | Status: DC

## 2013-04-29 MED ORDER — ATENOLOL 50 MG PO TABS: 50.0000 mg | ORAL_TABLET | Freq: Every day | ORAL | Status: DC

## 2013-04-29 MED ORDER — PRAVASTATIN SODIUM 10 MG PO TABS: 10.0000 mg | ORAL_TABLET | Freq: Every day | ORAL | Status: DC

## 2013-04-29 NOTE — Patient Instructions (Signed)

## 2013-04-29 NOTE — Progress Notes (Signed)
Pt is requesting to review her lab results and her echo cardiogram.

## 2013-04-29 NOTE — Progress Notes (Signed)
Patient ID: [name], female   DOB: 09/05/1960, 52 y.o.   MRN: [id number]   CC: Followup  HPI: Patient is 52 year old female who presents to clinic for followup and to discuss blood test results. She also reports persistent congestion, productive cough of yellow sputum area she has taken over-the-counter medicines with no relief. She also reports subjective fevers and chills, she is not aware of recent sick contacts or exposures. She denies chest pain or shortness of breath, no specific abdominal or urinary concerns  No Known Allergies History reviewed. No pertinent past medical history. Current Outpatient Prescriptions on File Prior to Visit  Medication Sig Dispense Refill  . amoxicillin-clavulanate (AUGMENTIN) 875-125 MG per tablet Take 1 tablet by mouth 2 (two) times daily.  20 tablet  0  . chlorpheniramine-HYDROcodone (TUSSIONEX PENNKINETIC ER) 10-8 MG/5ML LQCR Take 5 mLs by mouth every 12 (twelve) hours as needed. For cough  115 mL  0  . fluticasone (FLONASE) 50 MCG/ACT nasal spray Place 1 spray into the nose 2 (two) times daily.  1 g  2  . omeprazole (PRILOSEC) 40 MG capsule Take 1 capsule (40 mg total) by mouth 2 (two) times daily.  60 capsule  5   No current facility-administered medications on file prior to visit.   Family History  Problem Relation Age of Onset  . Cancer Father     lung  . Breast cancer Sister   . Cancer Maternal Grandfather     lung  . Cancer Paternal Grandfather     lung   History   Social History  . Marital Status: Divorced    Spouse Name: N/A    Number of Children: N/A  . Years of Education: N/A   Occupational History  . Not on file.   Social History Main Topics  . Smoking status: Never Smoker   . Smokeless tobacco: Never Used  . Alcohol Use: No  . Drug Use: No  . Sexual Activity: Not Currently   Other Topics Concern  . Not on file   Social History Narrative  . No narrative on file    Review of Systems  Constitutional: Per history  of present illness HENT: Negative for ear pain, nosebleeds, congestion, facial swelling, rhinorrhea, neck pain, neck stiffness and ear discharge.   Eyes: Negative for pain, discharge, redness, itching and visual disturbance.  Respiratory: Per history of present illness Cardiovascular: Negative for chest pain, palpitations and leg swelling.  Gastrointestinal: Negative for abdominal distention.  Genitourinary: Negative for dysuria, urgency, frequency, hematuria, flank pain, decreased urine volume, difficulty urinating and dyspareunia.  Musculoskeletal: Negative for back pain, joint swelling, arthralgias and gait problem.  Neurological: Negative for dizziness, tremors, seizures, syncope, facial asymmetry, speech difficulty, weakness, light-headedness, numbness and headaches.  Hematological: Negative for adenopathy. Does not bruise/bleed easily.  Psychiatric/Behavioral: Negative for hallucinations, behavioral problems, confusion, dysphoric mood, decreased concentration and agitation.    Objective:   Filed Vitals:   04/29/13 1227  BP: 174/93  Pulse: 73  Temp: 97.7 F (36.5 C)  Resp: 18    Physical Exam  Constitutional: Appears well-developed and well-nourished. No distress.  HENT: Normocephalic. External right and left ear normal. Oropharynx is clear and moist.  Eyes: Conjunctivae and EOM are normal. PERRLA, no scleral icterus.  Neck: Normal ROM. Neck supple. No JVD. No tracheal deviation. No thyromegaly.  CVS: RRR, S1/S2 +, no murmurs, no gallops, no carotid bruit.  Pulmonary: Effort and breath sounds normal, no stridor, rhonchi, wheezes, rales.  Abdominal: Soft. BS +,  no distension, tenderness, rebound or guarding.   Lab Results  Component Value Date   WBC 7.0 04/13/2013   HGB 13.0 04/13/2013   HCT 38.9 04/13/2013   MCV 89.8 04/13/2013   PLT 323 04/13/2013   Lab Results  Component Value Date   CREATININE 0.90 04/13/2013   BUN 15 04/13/2013   NA 140 04/13/2013   K 4.5 04/13/2013    CL 104 04/13/2013   CO2 25 04/13/2013    No results found for this basename: HGBA1C   Lipid Panel     Component Value Date/Time   CHOL 252* 04/13/2013 1242   TRIG 185* 04/13/2013 1242   HDL 60 04/13/2013 1242   CHOLHDL 4.2 04/13/2013 1242   VLDL 37 04/13/2013 1242   LDLCALC 155* 04/13/2013 1242       Assessment and plan:   Patient Active Problem List   Diagnosis Date Noted  . GERD (gastroesophageal reflux disease) - stable, continue PPI  04/13/2013  . HTN (hypertension) - blood pressure still elevated, increase dose of atenolol from 25-50 mg tablet daily. Patient advised to check blood pressure regularly and to call his back if the numbers are persistently higher than 140/90 so we can readjust the regimen if indicated  04/13/2013  .  hyperlipidemia  - discuss results of cholesterol panel. Will start low-dose pravastatin 10 mg by mouth daily. Dietary recommendations provided  04/13/2013       Upper respiratory symptoms with congestion and cough - will prescribe a Z-Pak

## 2013-05-16 ENCOUNTER — Ambulatory Visit: Payer: No Typology Code available for payment source | Attending: Internal Medicine

## 2013-05-17 ENCOUNTER — Telehealth: Payer: Self-pay

## 2013-05-19 ENCOUNTER — Ambulatory Visit: Payer: Self-pay | Attending: Internal Medicine | Admitting: Family Medicine

## 2013-05-19 ENCOUNTER — Encounter: Payer: Self-pay | Admitting: Family Medicine

## 2013-05-19 VITALS — BP 153/86 | HR 71 | Temp 98.0°F | Resp 16

## 2013-05-19 DIAGNOSIS — K219 Gastro-esophageal reflux disease without esophagitis: Secondary | ICD-10-CM | POA: Insufficient documentation

## 2013-05-19 DIAGNOSIS — I10 Essential (primary) hypertension: Secondary | ICD-10-CM | POA: Insufficient documentation

## 2013-05-19 DIAGNOSIS — E7800 Pure hypercholesterolemia, unspecified: Secondary | ICD-10-CM | POA: Insufficient documentation

## 2013-05-19 MED ORDER — LISINOPRIL-HYDROCHLOROTHIAZIDE 10-12.5 MG PO TABS: 1.0000 | ORAL_TABLET | Freq: Every day | ORAL | Status: DC

## 2013-05-19 NOTE — Progress Notes (Signed)
   Subjective:    Patient ID: [name], female    DOB: [birth year], 53 y.o.   MRN: [id number]  HPI [name] is here today with complaints of bilateral lower edema and headaches. These symptoms both started abruptly when she started pravastatin and increased her atenolol to weeks ago. She doesn't have blurry vision but says the headaches can sometimes be so bad that it hurts to try to focus on things. No audible neurological deficits. He denies shortness of breath, chest pain.   Review of Systems A 12 point review of systems is negative except as per hpi.       Objective:   Physical Exam Nursing note and vitals reviewed. Constitutional: She is oriented to person, place, and time. She appears well-developed and well-nourished.  HENT:  Mouth/Throat: Oropharynx is clear and moist. No oropharyngeal exudate.  Eyes: Conjunctivae are normal. Pupils are equal, round, and reactive to light.  Neck: Normal range of motion. Neck supple. No thyromegaly present.  Cardiovascular: Normal rate, regular rhythm and normal heart sounds.   Pulmonary/Chest: Effort normal and breath sounds normal.  Abdominal: Soft. Bowel sounds are normal. She exhibits no distension. There is no tenderness. There is no rebound.  Neurological: She is alert and oriented to person, place, and time. She has normal reflexes.  Skin: Skin is warm and dry.  Psychiatric: She has a normal mood and affect. Her behavior is normal.         Assessment & Plan:   [name] was seen today for medication reaction.  Diagnoses and associated orders for this visit:  Essential hypertension, benign - lisinopril-hydrochlorothiazide (PRINZIDE,ZESTORETIC) 10-12.5 MG per tablet; Take 1 tablet by mouth daily. Will discontinue the atenolol. We'll start Zestoretic. GERD (gastroesophageal reflux disease) Continue her medication.  Hypercholesteremia - Discontinue her pravastatin for now. I told her she will probably benefit from a cholesterol  medication but given her symptoms and we can't make too many changes at once.  We'll plan to see this patient back in 2 weeks, earlier if needed. I've asked her to let me know immediately she develops any shortness of breath or if the swelling in her legs hasn't improved.

## 2013-05-19 NOTE — Addendum Note (Signed)
Addended by: Dorothe Pea on: 05/19/2013 02:50 PM   Modules accepted: Orders

## 2013-05-19 NOTE — Progress Notes (Signed)
Patient states was recently put on new medications Has been having headaches Can not focus her eyes Has bilateral leg swelling

## 2013-05-19 NOTE — Patient Instructions (Signed)

## 2013-05-27 ENCOUNTER — Encounter: Payer: Self-pay | Admitting: Internal Medicine

## 2013-05-27 ENCOUNTER — Ambulatory Visit: Payer: Self-pay | Attending: Internal Medicine | Admitting: Internal Medicine

## 2013-05-27 ENCOUNTER — Telehealth: Payer: Self-pay | Admitting: Internal Medicine

## 2013-05-27 VITALS — BP 139/89 | HR 97 | Temp 98.7°F | Resp 14 | Ht 64.0 in | Wt 206.4 lb

## 2013-05-27 DIAGNOSIS — I10 Essential (primary) hypertension: Secondary | ICD-10-CM | POA: Insufficient documentation

## 2013-05-27 LAB — COMPLETE METABOLIC PANEL WITH GFR
ALT: 18 U/L (ref 0–35)
AST: 18 U/L (ref 0–37)
Albumin: 4.1 g/dL (ref 3.5–5.2)
Alkaline Phosphatase: 73 U/L (ref 39–117)
BUN: 16 mg/dL (ref 6–23)
CALCIUM: 9.5 mg/dL (ref 8.4–10.5)
CHLORIDE: 103 meq/L (ref 96–112)
CO2: 29 meq/L (ref 19–32)
Creat: 0.81 mg/dL (ref 0.50–1.10)
GFR, Est Non African American: 84 mL/min
Glucose, Bld: 144 mg/dL — ABNORMAL HIGH (ref 70–99)
Potassium: 4.4 mEq/L (ref 3.5–5.3)
Sodium: 138 mEq/L (ref 135–145)
Total Bilirubin: 0.3 mg/dL (ref 0.3–1.2)
Total Protein: 6.6 g/dL (ref 6.0–8.3)

## 2013-05-27 MED ORDER — FUROSEMIDE 40 MG PO TABS: 40.0000 mg | ORAL_TABLET | Freq: Every day | ORAL | Status: DC

## 2013-05-27 NOTE — Addendum Note (Signed)
Addended by: Allyson Sabal MD, Ascencion Dike on: 05/27/2013 02:44 PM   Modules accepted: Orders

## 2013-05-27 NOTE — Progress Notes (Signed)
Patient ID: [name], female   DOB: [birth year], 53 y.o.   MRN: [id number]   CC:  HPI:  53 year old female with a history of hypertension, who presents for a followup. The patient states that when she wakes up in the morning she wakes up with a headache, blood pressure is always in the 150s. She denies any chest pain shortness of breath. She has noticed left lower extremity swelling for the last 4 days. She denies any shortness of breath but always feels tired.  No Known Allergies No past medical history on file. Current Outpatient Prescriptions on File Prior to Visit  Medication Sig Dispense Refill  . amoxicillin-clavulanate (AUGMENTIN) 875-125 MG per tablet Take 1 tablet by mouth 2 (two) times daily.  20 tablet  0  . azithromycin (ZITHROMAX Z-PAK) 250 MG tablet Per pharmacy directions  6 each  0  . chlorpheniramine-HYDROcodone (TUSSIONEX PENNKINETIC ER) 10-8 MG/5ML LQCR Take 5 mLs by mouth every 12 (twelve) hours as needed. For cough  115 mL  0  . fluticasone (FLONASE) 50 MCG/ACT nasal spray Place 1 spray into the nose 2 (two) times daily.  1 g  2  . lisinopril-hydrochlorothiazide (PRINZIDE,ZESTORETIC) 10-12.5 MG per tablet Take 1 tablet by mouth daily.  30 tablet  1  . omeprazole (PRILOSEC) 40 MG capsule Take 1 capsule (40 mg total) by mouth 2 (two) times daily.  60 capsule  5   No current facility-administered medications on file prior to visit.   Family History  Problem Relation Age of Onset  . Cancer Father     lung  . Breast cancer Sister   . Cancer Maternal Grandfather     lung  . Cancer Paternal Grandfather     lung   History   Social History  . Marital Status: Divorced    Spouse Name: N/A    Number of Children: N/A  . Years of Education: N/A   Occupational History  . Not on file.   Social History Main Topics  . Smoking status: Never Smoker   . Smokeless tobacco: Never Used  . Alcohol Use: No  . Drug Use: No  . Sexual Activity: Not Currently   Other Topics  Concern  . Not on file   Social History Narrative  . No narrative on file    Review of Systems  Constitutional: As in history of present illness HENT: Negative for ear pain, nosebleeds, congestion, facial swelling, rhinorrhea, neck pain, neck stiffness and ear discharge.   Eyes: Negative for pain, discharge, redness, itching and visual disturbance.  Respiratory: Negative for cough, choking, chest tightness, shortness of breath, wheezing and stridor.   Cardiovascular: Negative for chest pain, palpitations and leg swelling.  Gastrointestinal: Negative for abdominal distention.  Genitourinary: Negative for dysuria, urgency, frequency, hematuria, flank pain, decreased urine volume, difficulty urinating and dyspareunia.  Musculoskeletal: Negative for back pain, joint swelling, arthralgias and gait problem.  Neurological: Negative for dizziness, tremors, seizures, syncope, facial asymmetry, speech difficulty, weakness, light-headedness, numbness and headaches.  Hematological: Negative for adenopathy. Does not bruise/bleed easily.  Psychiatric/Behavioral: Negative for hallucinations, behavioral problems, confusion, dysphoric mood, decreased concentration and agitation.    Objective:   Filed Vitals:   05/27/13 1417  BP: 139/89  Pulse: 97  Temp: 98.7 F (37.1 C)  Resp: 14    Physical Exam  Constitutional: Appears well-developed and well-nourished. No distress.  HENT: Normocephalic. External right and left ear normal. Oropharynx is clear and moist.  Eyes: Conjunctivae and EOM are normal. PERRLA,  no scleral icterus.  Neck: Normal ROM. Neck supple. No JVD. No tracheal deviation. No thyromegaly.  CVS: RRR, S1/S2 +, no murmurs, no gallops, no carotid bruit.  Pulmonary: Effort and breath sounds normal, no stridor, rhonchi, wheezes, rales.  Abdominal: Soft. BS +,  no distension, tenderness, rebound or guarding.  Musculoskeletal: Normal range of motion. No edema and no tenderness.   Lymphadenopathy: No lymphadenopathy noted, cervical, inguinal. Neuro: Alert. Normal reflexes, muscle tone coordination. No cranial nerve deficit. Skin: Skin is warm and dry. No rash noted. Not diaphoretic. No erythema. No pallor.  Psychiatric: Normal mood and affect. Behavior, judgment, thought content normal.   Lab Results  Component Value Date   WBC 7.0 04/13/2013   HGB 13.0 04/13/2013   HCT 38.9 04/13/2013   MCV 89.8 04/13/2013   PLT 323 04/13/2013   Lab Results  Component Value Date   CREATININE 0.90 04/13/2013   BUN 15 04/13/2013   NA 140 04/13/2013   K 4.5 04/13/2013   CL 104 04/13/2013   CO2 25 04/13/2013    No results found for this basename: HGBA1C   Lipid Panel     Component Value Date/Time   CHOL 252* 04/13/2013 1242   TRIG 185* 04/13/2013 1242   HDL 60 04/13/2013 1242   CHOLHDL 4.2 04/13/2013 1242   VLDL 37 04/13/2013 1242   LDLCALC 155* 04/13/2013 1242       Assessment and plan:   Patient Active Problem List   Diagnosis Date Noted  . Hypercholesteremia 05/19/2013  . Periodic health assessment, general screening, adult 04/13/2013  . GERD (gastroesophageal reflux disease) 04/13/2013  . HTN (hypertension) 04/13/2013  . Dyspnea 04/13/2013       Hypertension Continue Zestoretic Given swelling in her legs, we'll start Lasix Patient had a 2-D echo on 04/27/13 that showed an EF of 50-55%, PA pressure of 35   Left leg swelling D-dimer, if elevated will order a venous Doppler Follow up in one month  The patient was given clear instructions to go to ER or return to medical center if symptoms don't improve, worsen or new problems develop. The patient verbalized understanding. The patient was told to call to get any lab results if not heard anything in the next week.

## 2013-05-27 NOTE — Telephone Encounter (Signed)
Pt says swelling has not decreased and was told to call if that was the case. Pt asking to speak to [facility] if possible. Please f/u with pt.

## 2013-05-27 NOTE — Progress Notes (Signed)
Pt is here for a f/u. May have a medication reaction. Has been hypertensive over the past few days. Pt checks BP at home. Also complains of swelling in Lt leg x5 days. Pain while trying to sleep and rest. No pain today. Dizziness and headaches while trying to get up.

## 2013-05-28 LAB — D-DIMER, QUANTITATIVE: D-Dimer, Quant: 0.54 ug/mL-FEU — ABNORMAL HIGH (ref 0.00–0.48)

## 2013-05-28 LAB — TROPONIN I

## 2013-05-30 NOTE — Addendum Note (Signed)
Addended by: Allyson Sabal MD, Ascencion Dike on: 05/30/2013 10:48 AM   Modules accepted: Orders

## 2013-06-01 ENCOUNTER — Telehealth: Payer: Self-pay | Admitting: Emergency Medicine

## 2013-06-01 NOTE — Telephone Encounter (Signed)
Attempted  To reach pt for Dopplers scheduled appt. Left message for pt to call clinic

## 2013-06-02 ENCOUNTER — Ambulatory Visit (HOSPITAL_COMMUNITY)
Admission: RE | Admit: 2013-06-02 | Discharge: 2013-06-02 | Disposition: A | Discharge status: Home / Self Care | Payer: No Typology Code available for payment source | Source: Ambulatory Visit | Attending: Internal Medicine | Admitting: Internal Medicine

## 2013-06-02 DIAGNOSIS — M7989 Other specified soft tissue disorders: Secondary | ICD-10-CM

## 2013-06-02 DIAGNOSIS — I10 Essential (primary) hypertension: Secondary | ICD-10-CM

## 2013-06-02 DIAGNOSIS — K219 Gastro-esophageal reflux disease without esophagitis: Secondary | ICD-10-CM

## 2013-06-02 DIAGNOSIS — E7800 Pure hypercholesterolemia, unspecified: Secondary | ICD-10-CM

## 2013-06-02 NOTE — Progress Notes (Signed)
VASCULAR LAB PRELIMINARY  PRELIMINARY  PRELIMINARY  PRELIMINARY  Bilateral lower extremity venous duplex completed.    Preliminary report:   Bilateral:  No evidence of DVT, superficial thrombosis, or Baker's Cyst.   Vernadine Coombs, RVS 06/02/2013, 2:01 PM

## 2013-06-03 ENCOUNTER — Telehealth: Payer: Self-pay | Admitting: Internal Medicine

## 2013-06-03 NOTE — Telephone Encounter (Signed)
Pt. Is waiting on results from "Doppler" from her procedure on 06/02/13. Please call patient with results at [phone], she will be able at this number until 2pm today.

## 2013-06-03 NOTE — Telephone Encounter (Signed)
Patient is aware of doppler results States still having swelling to her legs Waking up at night with blood pressure still high

## 2013-06-07 ENCOUNTER — Ambulatory Visit: Payer: No Typology Code available for payment source | Attending: Internal Medicine

## 2013-06-07 VITALS — BP 168/97 | HR 90 | Temp 97.8°F | Resp 16

## 2013-06-07 DIAGNOSIS — Z299 Encounter for prophylactic measures, unspecified: Secondary | ICD-10-CM

## 2013-06-07 MED ORDER — FUROSEMIDE 20 MG PO TABS: 60.0000 mg | ORAL_TABLET | Freq: Every day | ORAL | Status: DC

## 2013-06-07 MED ORDER — LISINOPRIL 40 MG PO TABS: 40.0000 mg | ORAL_TABLET | Freq: Every day | ORAL | Status: DC

## 2013-06-07 NOTE — Progress Notes (Unsigned)
   Subjective:    Patient ID: [name], female    DOB: January 07, 1961, 53 y.o.   MRN: [id number]  HPI    Review of Systems     Objective:   Physical Exam        Assessment & Plan:  Pt came in today for blood pressure check due to increased h/a's,left ankle edema and elevated Diastolic  At home readings BP- 162/86 90 Pt states she was told by doctor to take Furosemide for BP and not Lisinopril. Informed pt she was to take both medications

## 2013-06-07 NOTE — Addendum Note (Signed)
Addended by: Allyson Sabal MD, Ascencion Dike on: 06/07/2013 02:34 PM   Modules accepted: Orders, Medications

## 2013-06-07 NOTE — Patient Instructions (Signed)
Lisinopril-HCTZ discontinued per provider Lisinopril 40 mg tab ordered with increase Lasix 20 mg TID Pt verbalized understanding and told to return next week for repeat bp

## 2013-06-14 ENCOUNTER — Ambulatory Visit: Payer: No Typology Code available for payment source | Attending: Internal Medicine

## 2013-06-14 DIAGNOSIS — Z299 Encounter for prophylactic measures, unspecified: Secondary | ICD-10-CM

## 2013-06-14 LAB — COMPLETE METABOLIC PANEL WITH GFR
ALK PHOS: 91 U/L (ref 39–117)
ALT: 20 U/L (ref 0–35)
AST: 20 U/L (ref 0–37)
Albumin: 4.5 g/dL (ref 3.5–5.2)
BILIRUBIN TOTAL: 0.5 mg/dL (ref 0.2–1.2)
BUN: 25 mg/dL — AB (ref 6–23)
CO2: 33 mEq/L — ABNORMAL HIGH (ref 19–32)
CREATININE: 1.1 mg/dL (ref 0.50–1.10)
Calcium: 9.8 mg/dL (ref 8.4–10.5)
Chloride: 92 mEq/L — ABNORMAL LOW (ref 96–112)
GFR, Est African American: 67 mL/min
GFR, Est Non African American: 58 mL/min — ABNORMAL LOW
Glucose, Bld: 92 mg/dL (ref 70–99)
Potassium: 4.5 mEq/L (ref 3.5–5.3)
Sodium: 137 mEq/L (ref 135–145)
Total Protein: 7.1 g/dL (ref 6.0–8.3)

## 2013-06-15 ENCOUNTER — Ambulatory Visit: Payer: Self-pay

## 2013-06-23 ENCOUNTER — Telehealth: Payer: Self-pay | Admitting: Internal Medicine

## 2013-06-23 ENCOUNTER — Telehealth: Payer: Self-pay | Admitting: Emergency Medicine

## 2013-06-23 NOTE — Telephone Encounter (Signed)
Pt. Returned [place] call. Please call pt. back

## 2013-06-23 NOTE — Telephone Encounter (Signed)
Spoke with pt regarding lab results. Pt given lab results. BUN 25 increased from 3 weeks ago. Pt is taking Lisinopril 40 mg tab. Will forward message to Dr. Annitta Needs for further instructions

## 2013-06-24 ENCOUNTER — Telehealth: Payer: Self-pay | Admitting: Emergency Medicine

## 2013-06-24 NOTE — Telephone Encounter (Signed)
Spoke with pt this am regarding elevated BUN. Pt informed to decrease Lasix to 4 0mg  instead of 60 mg. Pt also scheduled repeat BMP 06/30/13

## 2013-06-24 NOTE — Telephone Encounter (Signed)
Patient was seen by Dr. Allyson Sabal , as per the note she was already on lisinopril and was started on Lasix because of lower extremity swelling, recent BMP shows elevated BUN level most likely secondary to high dose of Lasix, advise patient to decrease Lasix to 40 mg daily, repeat BMP on the next visit.

## 2013-06-24 NOTE — Telephone Encounter (Signed)
Ok. Thanks  Pt aware

## 2013-06-30 ENCOUNTER — Ambulatory Visit: Payer: No Typology Code available for payment source | Attending: Internal Medicine

## 2013-06-30 DIAGNOSIS — R7989 Other specified abnormal findings of blood chemistry: Secondary | ICD-10-CM

## 2013-06-30 LAB — BASIC METABOLIC PANEL
BUN: 11 mg/dL (ref 6–23)
CHLORIDE: 103 meq/L (ref 96–112)
CO2: 30 meq/L (ref 19–32)
Calcium: 9.5 mg/dL (ref 8.4–10.5)
Creat: 0.81 mg/dL (ref 0.50–1.10)
GLUCOSE: 108 mg/dL — AB (ref 70–99)
POTASSIUM: 4.9 meq/L (ref 3.5–5.3)
SODIUM: 140 meq/L (ref 135–145)

## 2013-07-01 ENCOUNTER — Ambulatory Visit: Payer: Self-pay | Admitting: Internal Medicine

## 2013-07-05 ENCOUNTER — Telehealth: Payer: Self-pay | Admitting: Emergency Medicine

## 2013-07-05 NOTE — Telephone Encounter (Signed)
Pt called in requesting lab results. Normal results given

## 2013-07-12 ENCOUNTER — Ambulatory Visit: Payer: Self-pay | Attending: Internal Medicine | Admitting: Internal Medicine

## 2013-07-12 ENCOUNTER — Other Ambulatory Visit: Payer: Self-pay | Admitting: Internal Medicine

## 2013-07-12 ENCOUNTER — Encounter: Payer: Self-pay | Admitting: Internal Medicine

## 2013-07-12 VITALS — BP 145/92 | HR 81 | Temp 97.8°F | Resp 14 | Ht 64.0 in | Wt 209.2 lb

## 2013-07-12 DIAGNOSIS — E7800 Pure hypercholesterolemia, unspecified: Secondary | ICD-10-CM | POA: Insufficient documentation

## 2013-07-12 DIAGNOSIS — K219 Gastro-esophageal reflux disease without esophagitis: Secondary | ICD-10-CM | POA: Insufficient documentation

## 2013-07-12 DIAGNOSIS — R609 Edema, unspecified: Secondary | ICD-10-CM | POA: Insufficient documentation

## 2013-07-12 DIAGNOSIS — I10 Essential (primary) hypertension: Secondary | ICD-10-CM | POA: Insufficient documentation

## 2013-07-12 DIAGNOSIS — Z96659 Presence of unspecified artificial knee joint: Secondary | ICD-10-CM | POA: Insufficient documentation

## 2013-07-12 DIAGNOSIS — R51 Headache: Secondary | ICD-10-CM | POA: Insufficient documentation

## 2013-07-12 DIAGNOSIS — Z0000 Encounter for general adult medical examination without abnormal findings: Secondary | ICD-10-CM

## 2013-07-12 MED ORDER — FUROSEMIDE 20 MG PO TABS: 60.0000 mg | ORAL_TABLET | Freq: Every day | ORAL | Status: DC

## 2013-07-12 MED ORDER — TRAZODONE HCL 50 MG PO TABS
25.0000 mg | ORAL_TABLET | Freq: Every evening | ORAL | Status: DC | PRN
Start: 2013-07-12 — End: 2013-12-01

## 2013-07-12 MED ORDER — LISINOPRIL 40 MG PO TABS: 40.0000 mg | ORAL_TABLET | Freq: Every day | ORAL | Status: DC

## 2013-07-12 MED ORDER — OMEPRAZOLE 40 MG PO CPDR: 40.0000 mg | DELAYED_RELEASE_CAPSULE | Freq: Two times a day (BID) | ORAL | Status: DC

## 2013-07-12 MED ORDER — ATORVASTATIN CALCIUM 10 MG PO TABS: 10.0000 mg | ORAL_TABLET | Freq: Every day | ORAL | Status: DC

## 2013-07-12 NOTE — Progress Notes (Signed)
Patient ID: [name], female   DOB: 1960-06-29, 53 y.o.   MRN: [id number]   CC:  HPI:  Patient comes in with a history of hypertension, most recent 2-D echo showed no regional wall motion abnormalities no diastolic dysfunction. The patient had an EF of 50-55%. Lower extremity Doppler was negative. She has more swelling in her left leg and her right leg. she is status post left knee replacement in 2007. She also complains of waking up at 2 AM, with a throbbing headache, and blood pressure is usually elevated between 140-160. The patient's Lasix was recently decreased from 60-40 mg because of increase in her BUN. She denies any chest pain any shortness of breath    No Known Allergies No past medical history on file. Current Outpatient Prescriptions on File Prior to Visit  Medication Sig Dispense Refill  . amoxicillin-clavulanate (AUGMENTIN) 875-125 MG per tablet Take 1 tablet by mouth 2 (two) times daily.  20 tablet  0  . azithromycin (ZITHROMAX Z-PAK) 250 MG tablet Per pharmacy directions  6 each  0  . chlorpheniramine-HYDROcodone (TUSSIONEX PENNKINETIC ER) 10-8 MG/5ML LQCR Take 5 mLs by mouth every 12 (twelve) hours as needed. For cough  115 mL  0  . fluticasone (FLONASE) 50 MCG/ACT nasal spray Place 1 spray into the nose 2 (two) times daily.  1 g  2   No current facility-administered medications on file prior to visit.   Family History  Problem Relation Age of Onset  . Cancer Father     lung  . Breast cancer Sister   . Cancer Maternal Grandfather     lung  . Cancer Paternal Grandfather     lung   History   Social History  . Marital Status: Divorced    Spouse Name: N/A    Number of Children: N/A  . Years of Education: N/A   Occupational History  . Not on file.   Social History Main Topics  . Smoking status: Never Smoker   . Smokeless tobacco: Never Used  . Alcohol Use: No  . Drug Use: No  . Sexual Activity: Not Currently   Other Topics Concern  . Not on file    Social History Narrative  . No narrative on file    Review of Systems  Constitutional: Negative for fever, chills, diaphoresis, activity change, appetite change and fatigue.  HENT: Negative for ear pain, nosebleeds, congestion, facial swelling, rhinorrhea, neck pain, neck stiffness and ear discharge.   Eyes: Negative for pain, discharge, redness, itching and visual disturbance.  Respiratory: Negative for cough, choking, chest tightness, shortness of breath, wheezing and stridor.   Cardiovascular: Negative for chest pain, palpitations and increased leg swelling.  Gastrointestinal: Negative for abdominal distention.  Genitourinary: Negative for dysuria, urgency, frequency, hematuria, flank pain, decreased urine volume, difficulty urinating and dyspareunia.  Musculoskeletal: Negative for back pain, joint swelling, arthralgias and gait problem.  Neurological: Negative for dizziness, tremors, seizures, syncope, facial asymmetry, speech difficulty, weakness, light-headedness, numbness and headaches.  Hematological: Negative for adenopathy. Does not bruise/bleed easily.  Psychiatric/Behavioral: Negative for hallucinations, behavioral problems, confusion, dysphoric mood, decreased concentration and agitation.    Objective:   Filed Vitals:   07/12/13 1401  BP: 145/92  Pulse: 81  Temp: 97.8 F (36.6 C)  Resp: 14    Physical Exam  Constitutional: Appears well-developed and well-nourished. No distress.  HENT: Normocephalic. External right and left ear normal. Oropharynx is clear and moist.  Eyes: Conjunctivae and EOM are normal. PERRLA, no  scleral icterus.  Neck: Normal ROM. Neck supple. No JVD. No tracheal deviation. No thyromegaly.  CVS: RRR, S1/S2 +, no murmurs, no gallops, no carotid bruit.  Pulmonary: Effort and breath sounds normal, no stridor, rhonchi, wheezes, rales.  Abdominal: Soft. BS +,  no distension, tenderness, rebound or guarding.  Musculoskeletal: Normal range of motion.  2+ pitting edema and no tenderness.  Lymphadenopathy: No lymphadenopathy noted, cervical, inguinal. Neuro: Alert. Normal reflexes, muscle tone coordination. No cranial nerve deficit. Skin: Skin is warm and dry. No rash noted. Not diaphoretic. No erythema. No pallor.  Psychiatric: Normal mood and affect. Behavior, judgment, thought content normal.   Lab Results  Component Value Date   WBC 7.0 04/13/2013   HGB 13.0 04/13/2013   HCT 38.9 04/13/2013   MCV 89.8 04/13/2013   PLT 323 04/13/2013   Lab Results  Component Value Date   CREATININE 0.81 06/30/2013   BUN 11 06/30/2013   NA 140 06/30/2013   K 4.9 06/30/2013   CL 103 06/30/2013   CO2 30 06/30/2013    No results found for this basename: HGBA1C   Lipid Panel     Component Value Date/Time   CHOL 252* 04/13/2013 1242   TRIG 185* 04/13/2013 1242   HDL 60 04/13/2013 1242   CHOLHDL 4.2 04/13/2013 1242   VLDL 37 04/13/2013 1242   LDLCALC 155* 04/13/2013 1242       Assessment and plan:   Patient Active Problem List   Diagnosis Date Noted  . Hypercholesteremia 05/19/2013  . Periodic health assessment, general screening, adult 04/13/2013  . GERD (gastroesophageal reflux disease) 04/13/2013  . HTN (hypertension) 04/13/2013  . Dyspnea 04/13/2013       Bilateral dependent edema Patient could have a component of lymphedema We'll prescribe compression stockings Will increase Lasix to 60 mg a day Patient explained that this might increase her BUN and creatinine and she is okay with    Hypertension Patient to continue with lisinopril Increase Lasix to 60 mg a day We'll prescribe her trazodone to help her with her sleep hygeine    The patient was given clear instructions to go to ER or return to medical center if symptoms don't improve, worsen or new problems develop. The patient verbalized understanding. The patient was told to call to get any lab results if not heard anything in the next week.

## 2013-07-12 NOTE — Progress Notes (Signed)
Patient is here for a follow up. Complains of consistent accelerated BP. BP ranges from 140/90 to 160/99. Having some headaches due to hypertension. Continues to have some lower Lt leg swelling x2 months. Taking fluid medication to help swelling; progresses and worsens a few days later. Pain scale of 7. Also complains of back pain.  Patient states that bilateral legs will give out on her spontaneously.  Has had a few falls in the last couple of months.

## 2013-07-12 NOTE — Addendum Note (Signed)
Addended by: Allyson Sabal MD, Ascencion Dike on: 07/12/2013 03:19 PM   Modules accepted: Orders

## 2013-07-13 ENCOUNTER — Other Ambulatory Visit: Payer: Self-pay

## 2013-07-28 ENCOUNTER — Encounter: Payer: Self-pay | Admitting: Internal Medicine

## 2013-07-28 ENCOUNTER — Ambulatory Visit: Payer: Self-pay | Attending: Internal Medicine | Admitting: Internal Medicine

## 2013-07-28 VITALS — BP 136/82 | HR 79 | Temp 97.9°F | Resp 16 | Ht 64.0 in | Wt 206.0 lb

## 2013-07-28 DIAGNOSIS — R739 Hyperglycemia, unspecified: Secondary | ICD-10-CM

## 2013-07-28 DIAGNOSIS — Z79899 Other long term (current) drug therapy: Secondary | ICD-10-CM | POA: Insufficient documentation

## 2013-07-28 DIAGNOSIS — M7989 Other specified soft tissue disorders: Secondary | ICD-10-CM

## 2013-07-28 DIAGNOSIS — I10 Essential (primary) hypertension: Secondary | ICD-10-CM

## 2013-07-28 DIAGNOSIS — R7309 Other abnormal glucose: Secondary | ICD-10-CM

## 2013-07-28 DIAGNOSIS — Z96659 Presence of unspecified artificial knee joint: Secondary | ICD-10-CM | POA: Insufficient documentation

## 2013-07-28 LAB — COMPLETE METABOLIC PANEL WITH GFR
ALT: 17 U/L (ref 0–35)
AST: 17 U/L (ref 0–37)
Albumin: 4.1 g/dL (ref 3.5–5.2)
Alkaline Phosphatase: 90 U/L (ref 39–117)
BILIRUBIN TOTAL: 0.3 mg/dL (ref 0.2–1.2)
BUN: 16 mg/dL (ref 6–23)
CO2: 30 mEq/L (ref 19–32)
CREATININE: 0.86 mg/dL (ref 0.50–1.10)
Calcium: 9.5 mg/dL (ref 8.4–10.5)
Chloride: 101 mEq/L (ref 96–112)
GFR, Est African American: >89 mL/min
GFR, Est Non African American: 78 mL/min
Glucose, Bld: 94 mg/dL (ref 70–99)
Potassium: 4.6 mEq/L (ref 3.5–5.3)
Sodium: 137 mEq/L (ref 135–145)
Total Protein: 6.9 g/dL (ref 6.0–8.3)

## 2013-07-28 MED ORDER — NORMAL SALINE FLUSH 0.9 % IV SOLN
1000.0000 mL | Freq: Once | INTRAVENOUS | Status: AC
  Administered 2013-07-28: 1000 mL via INTRAVENOUS

## 2013-07-28 NOTE — Progress Notes (Signed)
MRN: [id number] Name: [name]  Sex: female Age: 53 y.o. DOB: [birth year]  Allergies: Review of patient's allergies indicates no known allergies.  Chief Complaint  Patient presents with  . Follow-up  . Leg Swelling    HPI: Patient is 53 y.o. female who has to of hypertension lower leg swelling, she was seen by Dr. Allyson Sabal 2 weeks ago and her Lasix was increased to 60 mg, patient reports improvement in the swelling, she denies any chest pain or shortness of breath had echocardiogram done as well as left lower leg ultrasound which was negative for DVT. In the past with her increased dose of Lasix her BUN/creatinine  went up, need to repeat blood chemistry.  History reviewed. No pertinent past medical history.  Past Surgical History  Procedure Laterality Date  . Total knee arthroplasty        Medication List       This list is accurate as of: 07/28/13 10:18 AM.  Always use your most recent med list.               amoxicillin-clavulanate 875-125 MG per tablet  Commonly known as:  AUGMENTIN  Take 1 tablet by mouth 2 (two) times daily.     atorvastatin 10 MG tablet  Commonly known as:  LIPITOR  Take 1 tablet (10 mg total) by mouth daily at 6 PM.     azithromycin 250 MG tablet  Commonly known as:  ZITHROMAX Z-PAK  Per pharmacy directions     chlorpheniramine-HYDROcodone 10-8 MG/5ML Lqcr  Commonly known as:  TUSSIONEX PENNKINETIC ER  Take 5 mLs by mouth every 12 (twelve) hours as needed. For cough     fluticasone 50 MCG/ACT nasal spray  Commonly known as:  FLONASE  Place 1 spray into the nose 2 (two) times daily.     furosemide 20 MG tablet  Commonly known as:  LASIX  Take 3 tablets (60 mg total) by mouth daily.     lisinopril 40 MG tablet  Commonly known as:  PRINIVIL,ZESTRIL  Take 1 tablet (40 mg total) by mouth daily.     omeprazole 40 MG capsule  Commonly known as:  PRILOSEC  Take 1 capsule (40 mg total) by mouth 2 (two) times daily.     traZODone 50 MG  tablet  Commonly known as:  DESYREL  Take 0.5-1 tablets (25-50 mg total) by mouth at bedtime as needed for sleep.        No orders of the defined types were placed in this encounter.    Immunization History  Administered Date(s) Administered  . Tdap 04/29/2013    Family History  Problem Relation Age of Onset  . Cancer Father     lung  . Breast cancer Sister   . Cancer Maternal Grandfather     lung  . Cancer Paternal Grandfather     lung    History  Substance Use Topics  . Smoking status: Never Smoker   . Smokeless tobacco: Never Used  . Alcohol Use: No    Review of Systems   As noted in HPI  Filed Vitals:   07/28/13 1008  BP: 136/82  Pulse: 79  Temp: 97.9 F (36.6 C)  Resp: 16    Physical Exam  Physical Exam  Constitutional: No distress.  Eyes: EOM are normal. Pupils are equal, round, and reactive to light.  Cardiovascular: Normal rate and regular rhythm.   Pulmonary/Chest: Breath sounds normal. No respiratory distress. She has no wheezes.  She has no rales.  Musculoskeletal:  1+ left leg edema, no erythema or tenderness     CBC    Component Value Date/Time   WBC 7.0 04/13/2013 1242   RBC 4.33 04/13/2013 1242   HGB 13.0 04/13/2013 1242   HCT 38.9 04/13/2013 1242   PLT 323 04/13/2013 1242   MCV 89.8 04/13/2013 1242   LYMPHSABS 2.6 04/13/2013 1242   MONOABS 0.8 04/13/2013 1242   EOSABS 0.1 04/13/2013 1242   BASOSABS 0.0 04/13/2013 1242    CMP     Component Value Date/Time   NA 140 06/30/2013 1146   K 4.9 06/30/2013 1146   CL 103 06/30/2013 1146   CO2 30 06/30/2013 1146   GLUCOSE 108* 06/30/2013 1146   BUN 11 06/30/2013 1146   CREATININE 0.81 06/30/2013 1146   CREATININE 1.0 01/28/2009 1632   CALCIUM 9.5 06/30/2013 1146   PROT 7.1 06/14/2013 1118   ALBUMIN 4.5 06/14/2013 1118   AST 20 06/14/2013 1118   ALT 20 06/14/2013 1118   ALKPHOS 91 06/14/2013 1118   BILITOT 0.5 06/14/2013 1118    Lab Results  Component Value Date/Time   CHOL 252* 04/13/2013 12:42 PM     No components found with this basename: hga1c    Lab Results  Component Value Date/Time   AST 20 06/14/2013 11:18 AM    Assessment and Plan  Essential hypertension, benign - Plan: Will repeat blood chemistry COMPLETE METABOLIC PANEL WITH GFR  Leg swelling - Plan: COMPLETE METABOLIC PANEL WITH GFR, also advise patient for compression stockings and leg elevation..   Return in about 3 months (around 10/28/2013) for hypertension.  Lorayne Marek, MD

## 2013-07-28 NOTE — Progress Notes (Signed)
Pt here to f/u bilat leg swelling Lasix increased to 60 mg daily  states swelling has improved Denies pain at this time VSS Weight today 206 from last visit 3/3 209 Pt needs education with daily weights

## 2013-07-29 ENCOUNTER — Telehealth: Payer: Self-pay

## 2013-07-29 NOTE — Telephone Encounter (Signed)
Message copied by Dorothe Pea on Fri Jul 29, 2013  2:29 PM ------      Message from: Lorayne Marek      Created: Fri Jul 29, 2013  1:42 PM       Call and let the patient know that her blood chemistry is normal ------

## 2013-07-29 NOTE — Telephone Encounter (Signed)
Patient is aware of her lab results 

## 2013-10-17 ENCOUNTER — Telehealth: Payer: Self-pay

## 2013-10-17 NOTE — Telephone Encounter (Signed)
Patient called stating her back has been bothering her for the past two weeks Denies any injury The pain is not relieved with tylenol Instructed patient she will need an appointment to be seen to be evaluated

## 2013-10-18 ENCOUNTER — Emergency Department (INDEPENDENT_AMBULATORY_CARE_PROVIDER_SITE_OTHER)
Admission: EM | Admit: 2013-10-18 | Discharge: 2013-10-18 | Disposition: A | Discharge status: Home / Self Care | Payer: Self-pay | Source: Home / Self Care | Attending: Emergency Medicine | Admitting: Emergency Medicine

## 2013-10-18 ENCOUNTER — Encounter (HOSPITAL_COMMUNITY): Payer: Self-pay | Admitting: Emergency Medicine

## 2013-10-18 DIAGNOSIS — M545 Low back pain, unspecified: Secondary | ICD-10-CM

## 2013-10-18 DIAGNOSIS — S161XXA Strain of muscle, fascia and tendon at neck level, initial encounter: Secondary | ICD-10-CM

## 2013-10-18 DIAGNOSIS — M7740 Metatarsalgia, unspecified foot: Secondary | ICD-10-CM

## 2013-10-18 LAB — POCT URINALYSIS DIP (DEVICE)
Bilirubin Urine: NEGATIVE
Glucose, UA: NEGATIVE mg/dL
Ketones, ur: NEGATIVE mg/dL
Leukocytes, UA: NEGATIVE
NITRITE: NEGATIVE
PH: 7 (ref 5.0–8.0)
PROTEIN: NEGATIVE mg/dL
Specific Gravity, Urine: 1.01 (ref 1.005–1.030)
Urobilinogen, UA: 0.2 mg/dL (ref 0.0–1.0)

## 2013-10-18 MED ORDER — METHYLPREDNISOLONE ACETATE 80 MG/ML IJ SUSP
80.0000 mg | Freq: Once | INTRAMUSCULAR | Status: AC
  Administered 2013-10-18: 80 mg via INTRAMUSCULAR

## 2013-10-18 MED ORDER — KETOROLAC TROMETHAMINE 60 MG/2ML IM SOLN
INTRAMUSCULAR | Status: AC
  Filled 2013-10-18: qty 2

## 2013-10-18 MED ORDER — MELOXICAM 15 MG PO TABS: 15.0000 mg | ORAL_TABLET | Freq: Every day | ORAL | Status: DC

## 2013-10-18 MED ORDER — METHYLPREDNISOLONE ACETATE 80 MG/ML IJ SUSP
INTRAMUSCULAR | Status: AC
  Filled 2013-10-18: qty 1

## 2013-10-18 MED ORDER — HYDROCODONE-ACETAMINOPHEN 5-325 MG PO TABS: ORAL_TABLET | ORAL | Status: DC

## 2013-10-18 MED ORDER — KETOROLAC TROMETHAMINE 60 MG/2ML IM SOLN
60.0000 mg | Freq: Once | INTRAMUSCULAR | Status: AC
  Administered 2013-10-18: 60 mg via INTRAMUSCULAR

## 2013-10-18 MED ORDER — CYCLOBENZAPRINE HCL 5 MG PO TABS: 5.0000 mg | ORAL_TABLET | Freq: Three times a day (TID) | ORAL | Status: DC | PRN

## 2013-10-18 NOTE — ED Provider Notes (Signed)
Chief Complaint   Chief Complaint  Patient presents with  . Back Pain    History of Present Illness   [name] is a 53 year old female who's had a one-month history of lower back pain. This does not radiate into her legs, and there's been no numbness, tingling, or weakness in the lower extremities. She also sometimes has pain in her neck and aching in her arms and the bottoms of her feet. This may or may not coincide with the lower back pain. The back pain is worse with bending, lifting, twisting, also getting up from a seated or lying position, and getting into and out of the car. She denies any lateral bowel dysfunction there is no saddle anesthesia. She's not had any fever, chills, or unintended weight loss. There's been no injury to the back or trauma.  Review of Systems   Other than as noted above, the patient denies any of the following symptoms: Systemic:  No fever, chills, or unexplained weight loss. GI:  No abdominal painor incontinence of bowel. GU:  No dysuria, frequency, urgency, or hematuria. No incontinence of urine or urinary retention.  M-S:  No neck pain or arthritis. Neuro:  No paresthesias, headache, saddle anesthesia, muscular weakness, or progressive neurological deficit.  [place]   Past medical history, family history, social history, meds, and allergies were reviewed. Specifically, there is no history of cancer, major trauma, osteoporosis, immunosuppression, or HIV infection. She has high blood pressure, elevated cholesterol, and gastroesophageal reflux. Current meds include Lipitor, furosemide, lisinopril, and omeprazole.  Physical Examination    Vital signs:  BP 145/98  Pulse 87  Temp(Src) 98.7 F (37.1 C) (Oral)  Resp 17  SpO2 99%  LMP 02/07/2013 General:  Alert, oriented, in no distress. Neck: There is pain to palpation over both trapezius ridges. The neck has a full range of motion with slight pain. Shoulders have a full range of motion with minimal  pain. Abdomen:  Soft, non-tender.  No organomegaly or mass.  No pulsatile midline abdominal mass or bruit. Back:  There is tenderness to palpation in the lower back just above the iliac crests. The back has 45 of flexion, 50 of extension, 15 of lateral bending towards the right, 20 of lateral bending towards the left, and 45 of rotation to either side with pain. Straight leg raising was negative. Neuro:  Normal muscle strength, sensations and DTRs. Extremities: Pedal pulses were full, there was no edema. She has pain to palpation over the metatarsal heads bilaterally. Skin:  Clear, warm and dry.  No rash.  Labs   Results for orders placed during the hospital encounter of 10/18/13  POCT URINALYSIS DIP (DEVICE)      Result Value Ref Range   Glucose, UA NEGATIVE  NEGATIVE mg/dL   Bilirubin Urine NEGATIVE  NEGATIVE   Ketones, ur NEGATIVE  NEGATIVE mg/dL   Specific Gravity, Urine 1.010  1.005 - 1.030   Hgb urine dipstick TRACE (*) NEGATIVE   pH 7.0  5.0 - 8.0   Protein, ur NEGATIVE  NEGATIVE mg/dL   Urobilinogen, UA 0.2  0.0 - 1.0 mg/dL   Nitrite NEGATIVE  NEGATIVE   Leukocytes, UA NEGATIVE  NEGATIVE    Course in Urgent [place]   She was given Depo-Medrol 80 mg IM and Toradol 60 mg IM.    Assessment   The primary encounter diagnosis was Lumbago. Diagnoses of Cervical strain and Metatarsalgia were also pertinent to this visit.  No evidence of cauda equina syndrome,  epidural abscess, or aneurism.    Plan     1.  Meds:  The following meds were prescribed:   Discharge Medication List as of 10/18/2013  3:43 PM    START taking these medications   Details  cyclobenzaprine (FLEXERIL) 5 MG tablet Take 1 tablet (5 mg total) by mouth 3 (three) times daily as needed for muscle spasms., Starting 10/18/2013, Until Discontinued, Normal    HYDROcodone-acetaminophen (NORCO/VICODIN) 5-325 MG per tablet 1 to 2 tabs every 4 to 6 hours as needed for pain., Print    meloxicam (MOBIC) 15 MG  tablet Take 1 tablet (15 mg total) by mouth daily., Starting 10/18/2013, Until Discontinued, Normal        2.  Patient Education/Counseling:  The patient was given appropriate handouts, self care instructions, and instructed in symptomatic relief. The patient was encouraged to try to be as active as possible and given some exercises to do followed by moist heat.  3.  Follow up:  The patient was told to follow up here if no better in 3 to 4 days, or sooner if becoming worse in any way, and given some red flag symptoms such as worsening pain or new neurological symptoms which would prompt immediate return.  Follow up with her primary care physician as soon as possible.     Harden Mo, MD 10/18/13 2159

## 2013-10-18 NOTE — ED Notes (Signed)
Pt c/o constant back pain onset 1 month Denies inj/trauma, urinary sx, gyn sx Reports she had lower abd pain on Friday but that has relieved Pain increases w/activity Taking Advil w/no relief Alert w/no signs of acute distress.

## 2013-10-18 NOTE — Discharge Instructions (Signed)
For foot pain, get metatarsal pads.  Do exercises twice daily followed by moist heat for 15 minutes.      Try to be as active as possible.  If no better in 2 weeks, follow up with orthopedist.   TREATMENT  Treatment initially involves the use of ice and medication to help reduce pain and inflammation. It is also important to perform strengthening and stretching exercises and modify activities that worsen symptoms so the injury does not get worse. These exercises may be performed at home or with a therapist. For patients who experience severe symptoms, a soft padded collar may be recommended to be worn around the neck.  Improving your posture may help reduce symptoms. Posture improvement includes pulling your chin and abdomen in while sitting or standing. If you are sitting, sit in a firm chair with your buttocks against the back of the chair. While sleeping, try replacing your pillow with a small towel rolled to 2 inches in diameter, or use a cervical pillow. Poor sleeping positions delay healing.   MEDICATION   If pain medication is necessary, nonsteroidal anti-inflammatory medications, such as aspirin and ibuprofen, or other minor pain relievers, such as acetaminophen, are often recommended.  Do not take pain medication for 7 days before surgery.  Prescription pain relievers may be given if deemed necessary by your caregiver. Use only as directed and only as much as you need.  HEAT AND COLD:   Cold treatment (icing) relieves pain and reduces inflammation. Cold treatment should be applied for 10 to 15 minutes every 2 to 3 hours for inflammation and pain and immediately after any activity that aggravates your symptoms. Use ice packs or an ice massage.  Heat treatment may be used prior to performing the stretching and strengthening activities prescribed by your caregiver, physical therapist, or athletic trainer. Use a heat pack or a warm soak.  SEEK MEDICAL CARE IF:   Symptoms get  worse or do not improve in 2 weeks despite treatment.  New, unexplained symptoms develop (drugs used in treatment may produce side effects).  EXERCISES RANGE OF MOTION (ROM) AND STRETCHING EXERCISES - Cervical Strain and Sprain These exercises may help you when beginning to rehabilitate your injury. In order to successfully resolve your symptoms, you must improve your posture. These exercises are designed to help reduce the forward-head and rounded-shoulder posture which contributes to this condition. Your symptoms may resolve with or without further involvement from your physician, physical therapist or athletic trainer. While completing these exercises, remember:   Restoring tissue flexibility helps normal motion to return to the joints. This allows healthier, less painful movement and activity.  An effective stretch should be held for at least 20 seconds, although you may need to begin with shorter hold times for comfort.  A stretch should never be painful. You should only feel a gentle lengthening or release in the stretched tissue.  STRETCH- Axial Extensors  Lie on your back on the floor. You may bend your knees for comfort. Place a rolled up hand towel or dish towel, about 2 inches in diameter, under the part of your head that makes contact with the floor.  Gently tuck your chin, as if trying to make a "double chin," until you feel a gentle stretch at the base of your head.  Hold _____10_____ seconds. Repeat _____10_____ times. Complete this exercise _____2_____ times per day.   STRETECH - Axial Extension   Stand or sit on a firm surface. Assume a good posture: chest  up, shoulders drawn back, abdominal muscles slightly tense, knees unlocked (if standing) and feet hip width apart.  Slowly retract your chin so your head slides back and your chin slightly lowers.Continue to look straight ahead.  You should feel a gentle stretch in the back of your head. Be certain not to feel an  aggressive stretch since this can cause headaches later.  Hold for ____10______ seconds. Repeat _____10_____ times. Complete this exercise ____2______ times per day.  STRETCH  Cervical Side Bend   Stand or sit on a firm surface. Assume a good posture: chest up, shoulders drawn back, abdominal muscles slightly tense, knees unlocked (if standing) and feet hip width apart.  Without letting your nose or shoulders move, slowly tip your right / left ear to your shoulder until your feel a gentle stretch in the muscles on the opposite side of your neck.  Hold _____10_____ seconds. Repeat _____10_____ times. Complete this exercise _____2_____ times per day.  STRETCH  Cervical Rotators   Stand or sit on a firm surface. Assume a good posture: chest up, shoulders drawn back, abdominal muscles slightly tense, knees unlocked (if standing) and feet hip width apart.  Keeping your eyes level with the ground, slowly turn your head until you feel a gentle stretch along the back and opposite side of your neck.  Hold _____10_____ seconds. Repeat ____10______ times. Complete this exercise ____2______ times per day.  RANGE OF MOTION - Neck Circles   Stand or sit on a firm surface. Assume a good posture: chest up, shoulders drawn back, abdominal muscles slightly tense, knees unlocked (if standing) and feet hip width apart.  Gently roll your head down and around from the back of one shoulder to the back of the other. The motion should never be forced or painful.  Repeat the motion 10-20 times, or until you feel the neck muscles relax and loosen. Repeat ____10______ times. Complete the exercise _____2_____ times per day.  STRENGTHENING EXERCISES - Cervical Strain and Sprain These exercises may help you when beginning to rehabilitate your injury. They may resolve your symptoms with or without further involvement from your physician, physical therapist or athletic trainer. While completing these exercises,  remember:   Muscles can gain both the endurance and the strength needed for everyday activities through controlled exercises.  Complete these exercises as instructed by your physician, physical therapist or athletic trainer. Progress the resistance and repetitions only as guided.  You may experience muscle soreness or fatigue, but the pain or discomfort you are trying to eliminate should never worsen during these exercises. If this pain does worsen, stop and make certain you are following the directions exactly. If the pain is still present after adjustments, discontinue the exercise until you can discuss the trouble with your clinician.  STRENGTH Cervical Flexors, Isometric  Face a wall, standing about 6 inches away. Place a small pillow, a ball about 6-8 inches in diameter, or a folded towel between your forehead and the wall.  Slightly tuck your chin and gently push your forehead into the soft object. Push only with mild to moderate intensity, building up tension gradually. Keep your jaw and forehead relaxed.  Hold 10 to 20 seconds. Keep your breathing relaxed.  Release the tension slowly. Relax your neck muscles completely before you start the next repetition. Repeat _____10_____ times. Complete this exercise _____2_____ times per day.  STRENGTH- Cervical Lateral Flexors, Isometric   Stand about 6 inches away from a wall. Place a small pillow, a ball about  6-8 inches in diameter, or a folded towel between the side of your head and the wall.  Slightly tuck your chin and gently tilt your head into the soft object. Push only with mild to moderate intensity, building up tension gradually. Keep your jaw and forehead relaxed.  Hold 10 to 20 seconds. Keep your breathing relaxed.  Release the tension slowly. Relax your neck muscles completely before you start the next repetition. Repeat _____10_____ times. Complete this exercise ____2______ times per day.  STRENGTH  Cervical Extensors,  Isometric   Stand about 6 inches away from a wall. Place a small pillow, a ball about 6-8 inches in diameter, or a folded towel between the back of your head and the wall.  Slightly tuck your chin and gently tilt your head back into the soft object. Push only with mild to moderate intensity, building up tension gradually. Keep your jaw and forehead relaxed.  Hold 10 to 20 seconds. Keep your breathing relaxed.  Release the tension slowly. Relax your neck muscles completely before you start the next repetition. Repeat _____10_____ times. Complete this exercise _____2_____ times per day.  POSTURE AND BODY MECHANICS CONSIDERATIONS - Cervical Strain and Sprain Keeping correct posture when sitting, standing or completing your activities will reduce the stress put on different body tissues, allowing injured tissues a chance to heal and limiting painful experiences. The following are general guidelines for improved posture. Your physician or physical therapist will provide you with any instructions specific to your needs. While reading these guidelines, remember:  The exercises prescribed by your provider will help you have the flexibility and strength to maintain correct postures.  The correct posture provides the optimal environment for your joints to work. All of your joints have less wear and tear when properly supported by a spine with good posture. This means you will experience a healthier, less painful body.  Correct posture must be practiced with all of your activities, especially prolonged sitting and standing. Correct posture is as important when doing repetitive low-stress activities (typing) as it is when doing a single heavy-load activity (lifting). PROLONGED STANDING WHILE SLIGHTLY LEANING FORWARD When completing a task that requires you to lean forward while standing in one place for a long time, place either foot up on a stationary 2-4 inch high object to help maintain the best posture.  When both feet are on the ground, the low back tends to lose its slight inward curve. If this curve flattens (or becomes too large), then the back and your other joints will experience too much stress, fatigue more quickly and can cause pain.  RESTING POSITIONS Consider which positions are most painful for you when choosing a resting position. If you have pain with flexion-based activities (sitting, bending, stooping, squatting), choose a position that allows you to rest in a less flexed posture. You would want to avoid curling into a fetal position on your side. If your pain worsens with extension-based activities (prolonged standing, working overhead), avoid resting in an extended position such as sleeping on your stomach. Most people will find more comfort when they rest with their spine in a more neutral position, neither too rounded nor too arched. Lying on a non-sagging bed on your side with a pillow between your knees, or on your back with a pillow under your knees will often provide some relief. Keep in mind, being in any one position for a prolonged period of time, no matter how correct your posture, can still lead to stiffness. WALKING  Walk with an upright posture. Your ears, shoulders and hips should all line-up. OFFICE WORK When working at a desk, create an environment that supports good, upright posture. Without extra support, muscles fatigue and lead to excessive strain on joints and other tissues. CHAIR:  A chair should be able to slide under your desk when your back makes contact with the back of the chair. This allows you to work closely.  The chair's height should allow your eyes to be level with the upper part of your monitor and your hands to be slightly lower than your elbows.  Body position:  Your feet should make contact with the floor. If this is not possible, use a foot rest.  Keep your ears over your shoulders. This will reduce stress on your neck and low back. Document  Released: 04/28/2005 Document Revised: 07/21/2011 Document Reviewed: 08/10/2008 [facility] Patient Information 2013 [place].   Plantar fasciitis is a tendonitis (inflammed tendon) of the the plantar fascia, the tendon on the bottom of the foot that supports the arch.  Often the pain is localized to the heel and can be worse first thing in the morning after getting up or after sitting for a long period of time and tends to improve as the day goes by, only to worsen later on in the afternoon or evening after you have been on your feet for a long time.  Following the program outlined below cures most cases.  If conservative measures like these don't work, corticosteroid injection, or referral to a podiatrist are other options.   Wear well fitting, lace up shoes with good arch support.  Tennis or running shoes are the best.  Do not wear heels, flip-flops, scuffs, or any kind of shoe without adequate support.    Do the exercises outlined below twice daily:

## 2013-10-26 ENCOUNTER — Ambulatory Visit: Payer: Self-pay | Attending: Internal Medicine | Admitting: Internal Medicine

## 2013-10-26 ENCOUNTER — Encounter: Payer: Self-pay | Admitting: Internal Medicine

## 2013-10-26 VITALS — BP 135/86 | HR 98 | Temp 98.4°F | Resp 16 | Ht 64.0 in | Wt 203.0 lb

## 2013-10-26 DIAGNOSIS — Z79899 Other long term (current) drug therapy: Secondary | ICD-10-CM | POA: Insufficient documentation

## 2013-10-26 DIAGNOSIS — M7989 Other specified soft tissue disorders: Secondary | ICD-10-CM | POA: Insufficient documentation

## 2013-10-26 DIAGNOSIS — M549 Dorsalgia, unspecified: Secondary | ICD-10-CM | POA: Insufficient documentation

## 2013-10-26 MED ORDER — IBUPROFEN 800 MG PO TABS: 800.0000 mg | ORAL_TABLET | Freq: Three times a day (TID) | ORAL | Status: DC | PRN

## 2013-10-26 NOTE — Progress Notes (Signed)
Patient ID: [name], female   DOB: 18-Sep-1960, 53 y.o.   MRN: [id number]  CC: follow up  HPI: Pt is 53 yo female who presents for follow up on leg swelling. She was started on Lasix and currently takes 20 mg TID and reports improvement in LE swelling. She also reports sudden onset of back pain, started several days prior to this visit. She is not sure what she did but has now constant pain in the lower back area, 10/10 in severity, radiating to upper back and neck area and bilateral feet, worse with walking and no specific alleviating factors. She was told she needs MRI but has no insurance and has to wait to get approved.   No Known Allergies History reviewed. No pertinent past medical history. Current Outpatient Prescriptions on File Prior to Visit  Medication Sig Dispense Refill  . atorvastatin (LIPITOR) 10 MG tablet Take 1 tablet (10 mg total) by mouth daily at 6 PM.  30 tablet  5  . cyclobenzaprine (FLEXERIL) 5 MG tablet Take 1 tablet (5 mg total) by mouth 3 (three) times daily as needed for muscle spasms.  30 tablet  0  . furosemide (LASIX) 20 MG tablet Take 3 tablets (60 mg total) by mouth daily.  240 tablet  3  . HYDROcodone-acetaminophen (NORCO/VICODIN) 5-325 MG per tablet 1 to 2 tabs every 4 to 6 hours as needed for pain.  20 tablet  0  . lisinopril (PRINIVIL,ZESTRIL) 40 MG tablet Take 1 tablet (40 mg total) by mouth daily.  90 tablet  3  . meloxicam (MOBIC) 15 MG tablet Take 1 tablet (15 mg total) by mouth daily.  30 tablet  0  . omeprazole (PRILOSEC) 40 MG capsule Take 1 capsule (40 mg total) by mouth 2 (two) times daily.  60 capsule  5  . traZODone (DESYREL) 50 MG tablet Take 0.5-1 tablets (25-50 mg total) by mouth at bedtime as needed for sleep.  30 tablet  3  . amoxicillin-clavulanate (AUGMENTIN) 875-125 MG per tablet Take 1 tablet by mouth 2 (two) times daily.  20 tablet  0  . azithromycin (ZITHROMAX Z-PAK) 250 MG tablet Per pharmacy directions  6 each  0  .  chlorpheniramine-HYDROcodone (TUSSIONEX PENNKINETIC ER) 10-8 MG/5ML LQCR Take 5 mLs by mouth every 12 (twelve) hours as needed. For cough  115 mL  0  . fluticasone (FLONASE) 50 MCG/ACT nasal spray Place 1 spray into the nose 2 (two) times daily.  1 g  2   No current facility-administered medications on file prior to visit.   Family History  Problem Relation Age of Onset  . Cancer Father     lung  . Breast cancer Sister   . Cancer Maternal Grandfather     lung  . Cancer Paternal Grandfather     lung   History   Social History  . Marital Status: Divorced    Spouse Name: N/A    Number of Children: N/A  . Years of Education: N/A   Occupational History  . Not on file.   Social History Main Topics  . Smoking status: Never Smoker   . Smokeless tobacco: Never Used  . Alcohol Use: No  . Drug Use: No  . Sexual Activity: Not Currently   Other Topics Concern  . Not on file   Social History Narrative  . No narrative on file    Review of Systems  Constitutional: Negative for fever, chills, diaphoresis, activity change, appetite change and fatigue.  HENT: Negative for ear pain, nosebleeds, congestion, facial swelling, rhinorrhea, neck pain, neck stiffness and ear discharge.   Eyes: Negative for pain, discharge, redness, itching and visual disturbance.  Respiratory: Negative for cough, choking, chest tightness, shortness of breath, wheezing and stridor.   Cardiovascular: Negative for chest pain, palpitations.  Gastrointestinal: Negative for abdominal distention.  Genitourinary: Negative for dysuria, urgency, frequency, hematuria, flank pain, decreased urine volume, difficulty urinating and dyspareunia.  Musculoskeletal: Negative for arthralgias and gait problem.  Neurological: Negative for dizziness, tremors, seizures, syncope, facial asymmetry, speech difficulty, weakness, light-headedness, numbness and headaches.  Hematological: Negative for adenopathy. Does not bruise/bleed  easily.  Psychiatric/Behavioral: Negative for hallucinations, behavioral problems, confusion, dysphoric mood, decreased concentration and agitation.    Objective:   Filed Vitals:   10/26/13 1419  BP: 135/86  Pulse: 98  Temp: 98.4 F (36.9 C)  Resp: 16    Physical Exam  Constitutional: Appears well-developed and well-nourished. No distress.  HENT: Normocephalic. External right and left ear normal. Oropharynx is clear and moist.  Eyes: Conjunctivae and EOM are normal. PERRLA, no scleral icterus.  Neck: Normal ROM. Neck supple. No JVD. No tracheal deviation. No thyromegaly.  CVS: RRR, S1/S2 +, no murmurs, no gallops, no carotid bruit.  Pulmonary: Effort and breath sounds normal, no stridor, rhonchi, wheezes, rales.  Abdominal: Soft. BS +,  no distension, tenderness, rebound or guarding.  Musculoskeletal: lower lumbar spine TTP extending to thoracic paraspinal area and neck area  Lymphadenopathy: No lymphadenopathy noted, cervical, inguinal. Neuro: Alert. Normal reflexes, muscle tone coordination. No cranial nerve deficit. Skin: Skin is warm and dry. No rash noted. Not diaphoretic. No erythema. No pallor.  Psychiatric: Normal mood and affect. Behavior, judgment, thought content normal.   Lab Results  Component Value Date   WBC 7.0 04/13/2013   HGB 13.0 04/13/2013   HCT 38.9 04/13/2013   MCV 89.8 04/13/2013   PLT 323 04/13/2013   Lab Results  Component Value Date   CREATININE 0.86 07/28/2013   BUN 16 07/28/2013   NA 137 07/28/2013   K 4.6 07/28/2013   CL 101 07/28/2013   CO2 30 07/28/2013    No results found for this basename: HGBA1C   Lipid Panel     Component Value Date/Time   CHOL 252* 04/13/2013 1242   TRIG 185* 04/13/2013 1242   HDL 60 04/13/2013 1242   CHOLHDL 4.2 04/13/2013 1242   VLDL 37 04/13/2013 1242   LDLCALC 155* 04/13/2013 1242       Assessment and plan:   Patient Active Problem List   Diagnosis Date Noted   LE swelling - better - continue same dose  Lasix Back pain - await for orange card and place order for MRI back at that time - neurosurgery referral

## 2013-10-26 NOTE — Progress Notes (Signed)
Pt is here following up on her HTN and her swelling of her left leg. Pt states that she went to the urgent care with extreme pain in her back, across her shoulders and neck.

## 2013-11-09 ENCOUNTER — Telehealth: Payer: Self-pay | Admitting: *Deleted

## 2013-11-09 NOTE — Telephone Encounter (Signed)
Patient is needing a referral to see someone for her back pain. Patient has the [place] discount plan. Please advise who and where patient will be referred to. Patient was referred to Neurosurgery on 10/26/2013 by Dr. Doyle Askew. Patient was informed that Neuro does not accept [employer] discount plan.

## 2013-11-18 ENCOUNTER — Telehealth: Payer: Self-pay | Admitting: *Deleted

## 2013-11-18 NOTE — Telephone Encounter (Signed)
Patient calling again regarding referral to see someone for her back pain. Patient has the [place] discount plan. Please advise who and where patient will be referred to. Patient was referred to Neurosurgery on 10/26/2013 by Dr. Doyle Askew. Patient was informed that Neuro does not accept [employer] discount plan. Please f/u with patient

## 2013-11-23 ENCOUNTER — Telehealth: Payer: Self-pay | Admitting: Emergency Medicine

## 2013-11-23 NOTE — Telephone Encounter (Signed)
Left message for pt to call when message received 

## 2013-11-23 NOTE — Telephone Encounter (Signed)
Pt calling for unresolved issue, says she has not heard back and has been waiting for answer for 2 weeks. Please f/u with pt.

## 2013-11-24 ENCOUNTER — Telehealth: Payer: Self-pay | Admitting: Emergency Medicine

## 2013-11-24 NOTE — Telephone Encounter (Signed)
Spoke with pt in regards to lower back pain. Pt wanted to know if there is a different neurosurgeon that will take orange card. Pt was seen by Dr. Doyle Askew- I will talk with her to seen if we can seek another specialist-

## 2013-11-24 NOTE — Telephone Encounter (Signed)
Pt returning missed call, says she prefers to be contacted at work number, [phone] until 1pm today. Please f/u with pt.

## 2013-11-25 NOTE — Telephone Encounter (Signed)
Pt returning nurse's call. Please f/u with pt.  °

## 2013-11-28 ENCOUNTER — Telehealth: Payer: Self-pay | Admitting: Emergency Medicine

## 2013-11-28 ENCOUNTER — Other Ambulatory Visit: Payer: Self-pay | Admitting: Emergency Medicine

## 2013-11-28 DIAGNOSIS — M546 Pain in thoracic spine: Secondary | ICD-10-CM

## 2013-11-28 NOTE — Telephone Encounter (Signed)
Left message to give pt scheduled MRI appt scheduled 12/15/13 @ 5 pm [facility]

## 2013-11-28 NOTE — Telephone Encounter (Signed)
Pt returning phone call. appt given for MRI

## 2013-11-28 NOTE — Telephone Encounter (Signed)
Returned pt phone call in regards to chronic back pain. Pt ordered MRI lumbar spine scheduled for 12/15/13 [facility] Thursday per Dr. Doreene Burke

## 2013-11-29 NOTE — Telephone Encounter (Signed)
Pt returning nurses call

## 2013-12-01 ENCOUNTER — Other Ambulatory Visit: Payer: Self-pay | Admitting: Internal Medicine

## 2013-12-14 ENCOUNTER — Telehealth: Payer: Self-pay | Admitting: Internal Medicine

## 2013-12-14 NOTE — Telephone Encounter (Signed)
Pt has a question for nurse, please f/u with pt.

## 2013-12-15 ENCOUNTER — Ambulatory Visit (HOSPITAL_COMMUNITY)
Admission: RE | Admit: 2013-12-15 | Discharge: 2013-12-15 | Disposition: A | Discharge status: Home / Self Care | Payer: No Typology Code available for payment source | Source: Ambulatory Visit | Attending: Internal Medicine | Admitting: Internal Medicine

## 2013-12-15 ENCOUNTER — Telehealth: Payer: Self-pay | Admitting: Emergency Medicine

## 2013-12-15 DIAGNOSIS — G8929 Other chronic pain: Secondary | ICD-10-CM | POA: Insufficient documentation

## 2013-12-15 DIAGNOSIS — M546 Pain in thoracic spine: Secondary | ICD-10-CM | POA: Insufficient documentation

## 2013-12-19 ENCOUNTER — Telehealth: Payer: Self-pay | Admitting: Emergency Medicine

## 2013-12-19 DIAGNOSIS — M47812 Spondylosis without myelopathy or radiculopathy, cervical region: Secondary | ICD-10-CM

## 2013-12-19 NOTE — Telephone Encounter (Signed)
Message copied by Ricci Barker on Mon Dec 19, 2013  4:54 PM ------      Message from: Tresa Garter      Created: Sun Dec 18, 2013  8:24 PM       Please inform patient that her MRI imaging of the lumbar spine showed advanced osteoarthritis but no nerve compression. She also has gallstone.      We will refer patient to orthopedic surgery for advanced osteoarthritis            Please place referral to orthopedic surgery ------

## 2013-12-19 NOTE — Telephone Encounter (Signed)
Pt given MRI results with instructions to f/u with orthopedic surgery referral. Order placed. Pt also informed MRI showed gallstones; no treatment needed at this time

## 2013-12-26 ENCOUNTER — Ambulatory Visit: Payer: Self-pay | Attending: Internal Medicine | Admitting: Internal Medicine

## 2013-12-26 ENCOUNTER — Encounter: Payer: Self-pay | Admitting: Internal Medicine

## 2013-12-26 VITALS — BP 141/86 | HR 71 | Temp 98.7°F | Resp 16 | Wt 201.0 lb

## 2013-12-26 DIAGNOSIS — M47895 Other spondylosis, thoracolumbar region: Secondary | ICD-10-CM | POA: Insufficient documentation

## 2013-12-26 DIAGNOSIS — E7800 Pure hypercholesterolemia, unspecified: Secondary | ICD-10-CM

## 2013-12-26 DIAGNOSIS — R296 Repeated falls: Secondary | ICD-10-CM

## 2013-12-26 DIAGNOSIS — Z79899 Other long term (current) drug therapy: Secondary | ICD-10-CM | POA: Insufficient documentation

## 2013-12-26 DIAGNOSIS — M810 Age-related osteoporosis without current pathological fracture: Secondary | ICD-10-CM | POA: Insufficient documentation

## 2013-12-26 DIAGNOSIS — I10 Essential (primary) hypertension: Secondary | ICD-10-CM

## 2013-12-26 DIAGNOSIS — Z1211 Encounter for screening for malignant neoplasm of colon: Secondary | ICD-10-CM

## 2013-12-26 DIAGNOSIS — Z9181 History of falling: Secondary | ICD-10-CM

## 2013-12-26 DIAGNOSIS — M47814 Spondylosis without myelopathy or radiculopathy, thoracic region: Secondary | ICD-10-CM

## 2013-12-26 NOTE — Progress Notes (Signed)
Patient here for follow up Had an MRI of her back and would like those results Complains she just falls for no reason

## 2013-12-26 NOTE — Progress Notes (Signed)
MRN: [id number] Name: AMARILYS LYLES  Sex: female Age: 53 y.o. DOB: Jul 06, 1960  Allergies: Review of patient's allergies indicates no known allergies.  Chief Complaint  Patient presents with  . Follow-up    HPI: Patient is 53 y.o. female who has history of hypertension hyperlipidemia, recently had MRI of lower back done reported to have osteoarthritis but no nerve compression, patient has already been referred to orthopedics and is already scheduled appointment, she reported to have frequent falls denies any history of seizures, denies any loss of consciousness, denies any urine incontinence or numbness.  History reviewed. No pertinent past medical history.  Past Surgical History  Procedure Laterality Date  . Total knee arthroplasty        Medication List       This list is accurate as of: 12/26/13  2:57 PM.  Always use your most recent med list.               amoxicillin-clavulanate 875-125 MG per tablet  Commonly known as:  AUGMENTIN  Take 1 tablet by mouth 2 (two) times daily.     atorvastatin 10 MG tablet  Commonly known as:  LIPITOR  Take 1 tablet (10 mg total) by mouth daily at 6 PM.     azithromycin 250 MG tablet  Commonly known as:  ZITHROMAX Z-PAK  Per pharmacy directions     chlorpheniramine-HYDROcodone 10-8 MG/5ML Lqcr  Commonly known as:  TUSSIONEX PENNKINETIC ER  Take 5 mLs by mouth every 12 (twelve) hours as needed. For cough     cyclobenzaprine 5 MG tablet  Commonly known as:  FLEXERIL  Take 1 tablet (5 mg total) by mouth 3 (three) times daily as needed for muscle spasms.     fluticasone 50 MCG/ACT nasal spray  Commonly known as:  FLONASE  Place 1 spray into the nose 2 (two) times daily.     furosemide 20 MG tablet  Commonly known as:  LASIX  Take 3 tablets (60 mg total) by mouth daily.     HYDROcodone-acetaminophen 5-325 MG per tablet  Commonly known as:  NORCO/VICODIN  1 to 2 tabs every 4 to 6 hours as needed for pain.     ibuprofen 800  MG tablet  Commonly known as:  ADVIL,MOTRIN  Take 1 tablet (800 mg total) by mouth every 8 (eight) hours as needed.     lisinopril 40 MG tablet  Commonly known as:  PRINIVIL,ZESTRIL  Take 1 tablet (40 mg total) by mouth daily.     meloxicam 15 MG tablet  Commonly known as:  MOBIC  Take 1 tablet (15 mg total) by mouth daily.     omeprazole 40 MG capsule  Commonly known as:  PRILOSEC  Take 1 capsule (40 mg total) by mouth 2 (two) times daily.     traZODone 50 MG tablet  Commonly known as:  DESYREL  TAKE 1/2 TO 1 TABLETS BY MOUTH AT BEDTIME AS NEEDED FOR SLEEP.        No orders of the defined types were placed in this encounter.    Immunization History  Administered Date(s) Administered  . Tdap 04/29/2013    Family History  Problem Relation Age of Onset  . Cancer Father     lung  . Breast cancer Sister   . Cancer Maternal Grandfather     lung  . Cancer Paternal Grandfather     lung    History  Substance Use Topics  . Smoking status: Never Smoker   .  Smokeless tobacco: Never Used  . Alcohol Use: No    Review of Systems   As noted in HPI  Filed Vitals:   12/26/13 1422  BP: 141/86  Pulse: 71  Temp: 98.7 F (37.1 C)  Resp: 16    Physical Exam  Physical Exam  Constitutional: No distress.  Eyes: EOM are normal. Pupils are equal, round, and reactive to light.  Cardiovascular: Normal rate and regular rhythm.   Pulmonary/Chest: Breath sounds normal. No respiratory distress. She has no wheezes. She has no rales.  Musculoskeletal:  Minimal, lower lumbar paraspinal tenderness, SLR test negative, equal strength both lower extremities     CBC    Component Value Date/Time   WBC 7.0 04/13/2013 1242   RBC 4.33 04/13/2013 1242   HGB 13.0 04/13/2013 1242   HCT 38.9 04/13/2013 1242   PLT 323 04/13/2013 1242   MCV 89.8 04/13/2013 1242   LYMPHSABS 2.6 04/13/2013 1242   MONOABS 0.8 04/13/2013 1242   EOSABS 0.1 04/13/2013 1242   BASOSABS 0.0 04/13/2013 1242     CMP     Component Value Date/Time   NA 137 07/28/2013 1028   K 4.6 07/28/2013 1028   CL 101 07/28/2013 1028   CO2 30 07/28/2013 1028   GLUCOSE 94 07/28/2013 1028   BUN 16 07/28/2013 1028   CREATININE 0.86 07/28/2013 1028   CREATININE 1.0 01/28/2009 1632   CALCIUM 9.5 07/28/2013 1028   PROT 6.9 07/28/2013 1028   ALBUMIN 4.1 07/28/2013 1028   AST 17 07/28/2013 1028   ALT 17 07/28/2013 1028   ALKPHOS 90 07/28/2013 1028   BILITOT 0.3 07/28/2013 1028   GFRNONAA 78 07/28/2013 1028   GFRAA >89 07/28/2013 1028    Lab Results  Component Value Date/Time   CHOL 252* 04/13/2013 12:42 PM    No components found with this basename: hga1c    Lab Results  Component Value Date/Time   AST 17 07/28/2013 10:28 AM    Assessment and Plan  Essential hypertension - Plan: Blood pressure is borderline elevated, advise for DASH diet continue with her lisinopril, Lasix, will repeat COMPLETE METABOLIC PANEL WITH GFR  Other osteoarthritis of spine, thoracolumbar region  patient is going to follow with orthopedics.  Frequent falls - Plan: Vit D  25 hydroxy (rtn osteoporosis monitoring), Vitamin B12  Hypercholesteremia Patient is on Lipitor 10 mg, will do fasting lipid panel on next visit  Special screening for malignant neoplasms, colon - Plan: Ambulatory referral to Gastroenterology   Health Maintenance -Colonoscopy: referred to GI  -Mammogram: uptodate    Return in about 3 months (around 03/28/2014) for hypertension, hyperipidemia.  Lorayne Marek, MD

## 2013-12-26 NOTE — Patient Instructions (Signed)
DASH Eating Plan °DASH stands for "Dietary Approaches to Stop Hypertension." The DASH eating plan is a healthy eating plan that has been shown to reduce high blood pressure (hypertension). Additional health benefits may include reducing the risk of type 2 diabetes mellitus, heart disease, and stroke. The DASH eating plan may also help with weight loss. °WHAT DO I NEED TO KNOW ABOUT THE DASH EATING PLAN? °For the DASH eating plan, you will follow these general guidelines: °· Choose foods with a percent daily value for sodium of less than 5% (as listed on the food label). °· Use salt-free seasonings or herbs instead of table salt or sea salt. °· Check with your health care provider or pharmacist before using salt substitutes. °· Eat lower-sodium products, often labeled as "lower sodium" or "no salt added." °· Eat fresh foods. °· Eat more vegetables, fruits, and low-fat dairy products. °· Choose whole grains. Look for the word "whole" as the first word in the ingredient list. °· Choose fish and skinless chicken or turkey more often than red meat. Limit fish, poultry, and meat to 6 oz (170 g) each day. °· Limit sweets, desserts, sugars, and sugary drinks. °· Choose heart-healthy fats. °· Limit cheese to 1 oz (28 g) per day. °· Eat more home-cooked food and less restaurant, buffet, and fast food. °· Limit fried foods. °· Cook foods using methods other than frying. °· Limit canned vegetables. If you do use them, rinse them well to decrease the sodium. °· When eating at a restaurant, ask that your food be prepared with less salt, or no salt if possible. °WHAT FOODS CAN I EAT? °Seek help from a dietitian for individual calorie needs. °Grains °Whole grain or whole wheat bread. Brown rice. Whole grain or whole wheat pasta. Quinoa, bulgur, and whole grain cereals. Low-sodium cereals. Corn or whole wheat flour tortillas. Whole grain cornbread. Whole grain crackers. Low-sodium crackers. °Vegetables °Fresh or frozen vegetables  (raw, steamed, roasted, or grilled). Low-sodium or reduced-sodium tomato and vegetable juices. Low-sodium or reduced-sodium tomato sauce and paste. Low-sodium or reduced-sodium canned vegetables.  °Fruits °All fresh, canned (in natural juice), or frozen fruits. °Meat and Other Protein Products °Ground beef (85% or leaner), grass-fed beef, or beef trimmed of fat. Skinless chicken or turkey. Ground chicken or turkey. Pork trimmed of fat. All fish and seafood. Eggs. Dried beans, peas, or lentils. Unsalted nuts and seeds. Unsalted canned beans. °Dairy °Low-fat dairy products, such as skim or 1% milk, 2% or reduced-fat cheeses, low-fat ricotta or cottage cheese, or plain low-fat yogurt. Low-sodium or reduced-sodium cheeses. °Fats and Oils °Tub margarines without trans fats. Light or reduced-fat mayonnaise and salad dressings (reduced sodium). Avocado. Safflower, olive, or canola oils. Natural peanut or almond butter. °Other °Unsalted popcorn and pretzels. °The items listed above may not be a complete list of recommended foods or beverages. Contact your dietitian for more options. °WHAT FOODS ARE NOT RECOMMENDED? °Grains °White bread. White pasta. White rice. Refined cornbread. Bagels and croissants. Crackers that contain trans fat. °Vegetables °Creamed or fried vegetables. Vegetables in a cheese sauce. Regular canned vegetables. Regular canned tomato sauce and paste. Regular tomato and vegetable juices. °Fruits °Dried fruits. Canned fruit in light or heavy syrup. Fruit juice. °Meat and Other Protein Products °Fatty cuts of meat. Ribs, chicken wings, bacon, sausage, bologna, salami, chitterlings, fatback, hot dogs, bratwurst, and packaged luncheon meats. Salted nuts and seeds. Canned beans with salt. °Dairy °Whole or 2% milk, cream, half-and-half, and cream cheese. Whole-fat or sweetened yogurt. Full-fat   cheeses or blue cheese. Nondairy creamers and whipped toppings. Processed cheese, cheese spreads, or cheese  curds. °Condiments °Onion and garlic salt, seasoned salt, table salt, and sea salt. Canned and packaged gravies. Worcestershire sauce. Tartar sauce. Barbecue sauce. Teriyaki sauce. Soy sauce, including reduced sodium. Steak sauce. Fish sauce. Oyster sauce. Cocktail sauce. Horseradish. Ketchup and mustard. Meat flavorings and tenderizers. Bouillon cubes. Hot sauce. Tabasco sauce. Marinades. Taco seasonings. Relishes. °Fats and Oils °Butter, stick margarine, lard, shortening, ghee, and bacon fat. Coconut, palm kernel, or palm oils. Regular salad dressings. °Other °Pickles and olives. Salted popcorn and pretzels. °The items listed above may not be a complete list of foods and beverages to avoid. Contact your dietitian for more information. °WHERE CAN I FIND MORE INFORMATION? °[employer], [employer], [employer]: www.nhlbi.nih.gov/health/health-topics/topics/dash/ °Document Released: 04/17/2011 Document Revised: 09/12/2013 Document Reviewed: 03/02/2013 °[employer]® Patient Information ©2015 [employer]. This information is not intended to replace advice given to you by your health care provider. Make sure you discuss any questions you have with your health care provider. ° °

## 2013-12-27 LAB — COMPLETE METABOLIC PANEL WITH GFR
ALT: 19 U/L (ref 0–35)
AST: 18 U/L (ref 0–37)
Albumin: 4.3 g/dL (ref 3.5–5.2)
Alkaline Phosphatase: 82 U/L (ref 39–117)
BILIRUBIN TOTAL: 0.3 mg/dL (ref 0.2–1.2)
BUN: 16 mg/dL (ref 6–23)
CO2: 29 mEq/L (ref 19–32)
CREATININE: 0.91 mg/dL (ref 0.50–1.10)
Calcium: 9.3 mg/dL (ref 8.4–10.5)
Chloride: 104 mEq/L (ref 96–112)
GFR, EST AFRICAN AMERICAN: 84 mL/min
GFR, Est Non African American: 73 mL/min
Glucose, Bld: 113 mg/dL — ABNORMAL HIGH (ref 70–99)
Potassium: 5 mEq/L (ref 3.5–5.3)
Sodium: 137 mEq/L (ref 135–145)
Total Protein: 6.7 g/dL (ref 6.0–8.3)

## 2013-12-27 LAB — VITAMIN D 25 HYDROXY (VIT D DEFICIENCY, FRACTURES): VIT D 25 HYDROXY: 62 ng/mL (ref 30–89)

## 2013-12-27 LAB — VITAMIN B12: Vitamin B-12: 286 pg/mL (ref 211–911)

## 2013-12-29 ENCOUNTER — Telehealth: Payer: Self-pay | Admitting: Internal Medicine

## 2013-12-29 NOTE — Telephone Encounter (Signed)
Pt. Called to about her lab results. Please f/u with pt.

## 2013-12-29 NOTE — Telephone Encounter (Signed)
Informed patient that lab results have not been reviewed/interpeted by PCP. Informed patient that as soon as they are interpeted/reviewed by PCP that [facility] will call her. Patient verbalized understanding. Vivia Birmingham, RN

## 2014-01-02 ENCOUNTER — Telehealth: Payer: Self-pay | Admitting: Emergency Medicine

## 2014-01-02 ENCOUNTER — Ambulatory Visit: Payer: Self-pay | Admitting: Sports Medicine

## 2014-01-02 NOTE — Telephone Encounter (Signed)
Pt given lab results 

## 2014-01-02 NOTE — Telephone Encounter (Signed)
Pt. Calling about her lab results. Please f/u with nurse.

## 2014-01-09 ENCOUNTER — Ambulatory Visit: Payer: Self-pay | Admitting: Sports Medicine

## 2014-01-11 ENCOUNTER — Encounter: Payer: Self-pay | Admitting: Sports Medicine

## 2014-01-11 ENCOUNTER — Other Ambulatory Visit: Payer: Self-pay | Admitting: Internal Medicine

## 2014-01-11 ENCOUNTER — Ambulatory Visit (INDEPENDENT_AMBULATORY_CARE_PROVIDER_SITE_OTHER): Payer: Self-pay | Admitting: Sports Medicine

## 2014-01-11 VITALS — BP 138/85 | HR 71 | Ht 64.0 in | Wt 201.0 lb

## 2014-01-11 DIAGNOSIS — M7741 Metatarsalgia, right foot: Secondary | ICD-10-CM

## 2014-01-11 DIAGNOSIS — R600 Localized edema: Secondary | ICD-10-CM

## 2014-01-11 DIAGNOSIS — M79609 Pain in unspecified limb: Secondary | ICD-10-CM

## 2014-01-11 DIAGNOSIS — M7750 Other enthesopathy of unspecified foot: Secondary | ICD-10-CM

## 2014-01-11 DIAGNOSIS — M7742 Metatarsalgia, left foot: Secondary | ICD-10-CM

## 2014-01-11 DIAGNOSIS — M79604 Pain in right leg: Secondary | ICD-10-CM | POA: Insufficient documentation

## 2014-01-11 DIAGNOSIS — R609 Edema, unspecified: Secondary | ICD-10-CM

## 2014-01-11 DIAGNOSIS — M79605 Pain in left leg: Secondary | ICD-10-CM

## 2014-01-11 DIAGNOSIS — M47814 Spondylosis without myelopathy or radiculopathy, thoracic region: Secondary | ICD-10-CM

## 2014-01-11 DIAGNOSIS — M47895 Other spondylosis, thoracolumbar region: Secondary | ICD-10-CM

## 2014-01-11 MED ORDER — GABAPENTIN 100 MG PO CAPS: ORAL_CAPSULE | ORAL | Status: DC

## 2014-01-11 NOTE — Patient Instructions (Signed)
We are referring you to physical therapy to start a therapeutic exercise program. I like for you to get an arterial Doppler to evaluate the blood flowing her legs. Am starting on a medication to take it night to help with nerve pain. Try using the cushioned support we are putting in your shoes today to see if that helps with her pain.

## 2014-01-11 NOTE — Assessment & Plan Note (Deleted)
Multiple degenerative changes on MRI of multilevel facets.

## 2014-01-11 NOTE — Progress Notes (Signed)
[name] - 53 y.o. female MRN [id number]  Date of birth: 04/12/1961  SUBJECTIVE:  Including CC & ROS.  The patient is referred for evaluation of: Back Pain and bilateral leg pain: Patient presents with long-standing back pain that is worsened over the past 2-3 months. She reports the constellation of associated symptoms including occasional falls due to 2 weakness with no prodromal symptoms, bilateral calf cramping at night, difficulty with ambulation due to weakness and posterior leg pain. She is to able to walk approximately 200-300 feet before her symptoms occur. Leaning forward over a shopping cart does not significantly alleviate the symptoms. She has been taking meloxicam on a regular basis and reporting only minimal improvement. She is having significant nighttime disturbance. Denies any changes in bowel or bladder. No fevers, chills, night sweats or recent weight loss.  She hasn't had any specific therapeutic exercises.  She does have chronic lower extremity swelling left, greater than right. Taking Lasix for this without significant improvement. Worse when standing on her feet for prolonged period especially at the end of the day  Additionally she reports long-standing history of bilateral midfoot pain. Worse with standing for prolonged period she has not tried any specific orthotics or therapeutic exercises. No trauma reported to  HISTORY: Past Medical, Surgical, Social, and Family History Reviewed & Updated per EMR. 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 Marrow inflammation and edema associated L5-S1. Mild L4-L5 slip could not fully assess for tomorrow and supine position. Mild L4-L5 lateral recess stenosis without compression. Incidental cholelithiasis. No x-rays of her lumbar spine  available.  OBJECTIVE FINDINGS:  VS:  HT:5\' 4"  (162.6 cm)   WT:201 lb (91.173 kg)  BMI:34.6          BP:138/85 mmHg  HR:71bpm  TEMP: ( )  RESP:   PHYSICAL EXAM:            GENERAL:  Adult Caucasian female. In no discomfort; no respiratory distress                PSYCH:  alert and appropriate, good insight  Back & LE Exam:   APPEAR/PALP:  Overall normal-appearing, she is tender over bilateral SI joints.                     ROM:  Limited flexion extension, hamstring flexibility to 70 bilaterally        STRENGTH:  Lower extremity myotomes 5+5, able to heel and toe walk without difficulty                   NV:  Sensation grossly intact in bilateral lower extremity  Dermatomes, pulses not appreciated on exam today. Capillary refill 3-4 seconds. Lower extremity DTRs 2+ out of 4 diffusely, no suprapatellar reflex              Tests:  Negative straight leg raise bilaterally. Bilateral Foot Exam:   APPEAR/PALP:  Bilateral Morton's foot with early claw toe deformity of second and third toes. Well-maintained longitudinal arch. Minimal tenderness palpation                    ROM:  Full intrinsic foot and ankle motion        STRENGTH:  Good toe off with appropriate bilateral posterior tibialis  recruitment.               Tests:  Negative forefoot squeeze test  ASSESSMENT: 1. Other osteoarthritis of spine, thoracolumbar region   2. Bilateral leg pain   3. Bilateral edema of lower extremity   4. Metatarsalgia of both feet    Unclear etiology concern for potential vascular component versus neurogenic claudication. MRI less convincing of neurogenic cause and lack of pulses today on exam towards further evaluation.  PLAN: See problem based charting & AVS for additional documentation. - Start low dose gabapentin.   - ABI for evaluating potential vascular claudication given difficult to palpate DP and PT pulses with slightly delayed cap refill of 3 seconds.  No findings on MRI to fully explain LE sx.   Also with LE edema likely some venous insufficiency and may benefit from compression socks but need to eval arterial flow prior to adding compression.   - Refer to PT for therapeutic exercises and HEP. - Metatarsal pads for transverse arch collapse; likely cause of some of foot pain but does not explain calf cramping or falls > If not improved at return visit consider standing lumbar films to eval for potential dynamic anterolisthesis not seen on supine MRI and consider referral for Facet vs intralaminar injections. > If worsening falls needs to see Neurology Meds ordered this encounter  Medications  . gabapentin (NEURONTIN) 100 MG capsule    Sig: 1 cap po qhsX3 days then up to 1 cap po tid prn.    Dispense:  30 capsule    Refill:  1    Orders Placed This Encounter  Procedures  . Ambulatory referral to Physical Therapy  . Lower Extremity Arterial Duplex Bilateral

## 2014-01-12 ENCOUNTER — Ambulatory Visit (HOSPITAL_COMMUNITY)
Admission: RE | Admit: 2014-01-12 | Discharge: 2014-01-12 | Disposition: A | Discharge status: Home / Self Care | Payer: No Typology Code available for payment source | Source: Ambulatory Visit | Attending: Sports Medicine | Admitting: Sports Medicine

## 2014-01-12 DIAGNOSIS — I10 Essential (primary) hypertension: Secondary | ICD-10-CM | POA: Insufficient documentation

## 2014-01-12 DIAGNOSIS — M79605 Pain in left leg: Secondary | ICD-10-CM

## 2014-01-12 DIAGNOSIS — M79609 Pain in unspecified limb: Secondary | ICD-10-CM | POA: Insufficient documentation

## 2014-01-12 DIAGNOSIS — M79604 Pain in right leg: Secondary | ICD-10-CM

## 2014-01-12 DIAGNOSIS — M7741 Metatarsalgia, right foot: Secondary | ICD-10-CM | POA: Insufficient documentation

## 2014-01-12 DIAGNOSIS — R600 Localized edema: Secondary | ICD-10-CM

## 2014-01-12 DIAGNOSIS — M7742 Metatarsalgia, left foot: Secondary | ICD-10-CM

## 2014-01-12 NOTE — Progress Notes (Signed)
VASCULAR LAB PRELIMINARY  ARTERIAL  ABI completed:    RIGHT    LEFT    PRESSURE WAVEFORM  PRESSURE WAVEFORM  BRACHIAL 155 triphasic BRACHIAL 152 triphasic  DP   DP    AT 181 triphasic AT 184 triphasic  PT 171 biphasic PT 177 triphasic  PER   PER    GREAT TOE  NA GREAT TOE  NA    RIGHT LEFT  ABI >1.0 >1.0     Giovanna Kemmerer, RVT 01/12/2014, 3:40 PM

## 2014-01-17 ENCOUNTER — Ambulatory Visit: Payer: Self-pay | Admitting: Sports Medicine

## 2014-01-25 ENCOUNTER — Ambulatory Visit
Admission: RE | Admit: 2014-01-25 | Discharge: 2014-01-25 | Disposition: A | Discharge status: Home / Self Care | Payer: No Typology Code available for payment source | Source: Ambulatory Visit | Attending: Sports Medicine | Admitting: Sports Medicine

## 2014-01-25 ENCOUNTER — Encounter: Payer: Self-pay | Admitting: Sports Medicine

## 2014-01-25 ENCOUNTER — Ambulatory Visit (INDEPENDENT_AMBULATORY_CARE_PROVIDER_SITE_OTHER): Payer: Self-pay | Admitting: Sports Medicine

## 2014-01-25 VITALS — BP 150/81 | Ht 64.0 in | Wt 190.0 lb

## 2014-01-25 DIAGNOSIS — R609 Edema, unspecified: Secondary | ICD-10-CM

## 2014-01-25 DIAGNOSIS — M47895 Other spondylosis, thoracolumbar region: Secondary | ICD-10-CM

## 2014-01-25 DIAGNOSIS — M79605 Pain in left leg: Secondary | ICD-10-CM

## 2014-01-25 DIAGNOSIS — R600 Localized edema: Secondary | ICD-10-CM

## 2014-01-25 DIAGNOSIS — M79609 Pain in unspecified limb: Secondary | ICD-10-CM

## 2014-01-25 DIAGNOSIS — M47814 Spondylosis without myelopathy or radiculopathy, thoracic region: Secondary | ICD-10-CM

## 2014-01-25 DIAGNOSIS — M79604 Pain in right leg: Secondary | ICD-10-CM

## 2014-01-25 MED ORDER — PREDNISONE 50 MG PO TABS: 50.0000 mg | ORAL_TABLET | Freq: Every day | ORAL | Status: DC

## 2014-01-25 NOTE — Progress Notes (Signed)
[name] - 53 y.o. female MRN [id number]  Date of birth: 09-29-1960  SUBJECTIVE:  Including CC & ROS.  The patient is following up for evaluation of: Back Pain and bilateral leg pain: long-standing back pain. Similar symptoms to last visit without significant worsening or improvement.  Scheduled for physical therapy next week. He was able to go to the full compressible this weekend and able to ambulate for over an hour but severe significant pain at night. Radicular symptoms with pain over the bilateral dorsum of the feet.   Persistent onstellation of associated symptoms including occasional falls due to  weakness with no prodromal symptoms - that have not occurred since last office visit. Continues to have bilateral calf cramping at night, difficulty with ambulation due to weakness and posterior leg pain. She is to able to walk approximately 200-300 feet before her symptoms occur. She does report that leaning forward with a shopping cart does help alleviate her symptoms denies significant nighttime disturbance since last visit. Denies any changes in bowel or bladder. No fevers, chills, night sweats or recent weight loss.    Chronic lower sugar swelling is essentially unchanged. Left greater than right. Worse when standing on her feet for prolonged period especially at the end of the day. Better by the morning. Has TED hose as she cannot afford compression socks  HISTORY: Past Medical, Surgical, Social, and Family History Reviewed & Updated per EMR. 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 Marrow inflammation and edema associated L5-S1. Mild L4-L5 slip could not fully assess for tomorrow and supine position. Mild L4-L5 lateral recess stenosis without compression. Incidental cholelithiasis. ABIs on  01/12/2014 essentially normal with values between 1.1 and 1.19 and triphasic the exception of the right posterior tibial which is biphasic. No x-rays of her lumbar spine available.  OBJECTIVE FINDINGS:  VS:  HT:5\' 4"  (162.6 cm)   WT:190 lb (86.183 kg)  BMI:32.7          BP:150/81 mmHg  HR: bpm  TEMP: ( )  RESP:   PHYSICAL EXAM:            GENERAL:  Adult Caucasian female. In no discomfort; no respiratory distress                PSYCH:  alert and appropriate, good insight  Back & LE Exam:   APPEAR/PALP:  Overall normal-appearing, she is tender over bilateral SI joints.        STRENGTH:  Lower extremity myotomes 5+5, able to heel and toe walk without difficulty                   NV:  Sensation grossly intact in bilateral lower extremity  Dermatomes, pulses not appreciated on exam today. Capillary refill 3-4 seconds. Lower extremity DTRs 2+ out of 4 diffusely, no suprapatellar reflex              Tests:  Negative straight leg raise bilaterally. Bilateral Foot Exam:   APPEAR/PALP:  Bilateral Morton's foot with early claw toe deformity of second and third toes. Well-maintained longitudinal arch. Minimal tenderness palpation                    ROM:  Full intrinsic foot and ankle motion        STRENGTH:  Good toe off with appropriate bilateral posterior tibialis recruitment.               Tests:  Negative forefoot squeeze test  ASSESSMENT: 1. Other osteoarthritis of spine, thoracolumbar region   2. Bilateral edema of lower extremity   3. Bilateral leg pain    Continues to have persistent symptoms not improved with gabapentin. No red flags today on exam or history  PLAN: See problem based charting & AVS for additional documentation. - Prednisone dose pack. - Restart compression socks discussed importance of wearing throughout the day. - Physical therapy scheduled for next week. - Plain film x-rays of the lumbar spine to evaluate for anterolisthesis > If worsening falls needs to see  Neurology > Return in about 6 weeks (around 03/08/2014).

## 2014-01-25 NOTE — Patient Instructions (Signed)
Wear the compression socks you have Trial of prednisone Go get an x-ray of your back Stop gabapentin Follow with PT

## 2014-01-30 ENCOUNTER — Ambulatory Visit: Payer: No Typology Code available for payment source | Attending: Sports Medicine

## 2014-01-30 DIAGNOSIS — I10 Essential (primary) hypertension: Secondary | ICD-10-CM | POA: Insufficient documentation

## 2014-01-30 DIAGNOSIS — M546 Pain in thoracic spine: Secondary | ICD-10-CM | POA: Insufficient documentation

## 2014-01-30 DIAGNOSIS — Z96659 Presence of unspecified artificial knee joint: Secondary | ICD-10-CM | POA: Insufficient documentation

## 2014-01-30 DIAGNOSIS — M2550 Pain in unspecified joint: Secondary | ICD-10-CM | POA: Insufficient documentation

## 2014-01-30 DIAGNOSIS — M545 Low back pain, unspecified: Secondary | ICD-10-CM | POA: Insufficient documentation

## 2014-01-30 DIAGNOSIS — IMO0001 Reserved for inherently not codable concepts without codable children: Secondary | ICD-10-CM | POA: Insufficient documentation

## 2014-02-01 ENCOUNTER — Telehealth: Payer: Self-pay | Admitting: Sports Medicine

## 2014-02-01 MED ORDER — MEDICAL COMPRESSION SOCKS MISC: Status: DC

## 2014-02-01 NOTE — Telephone Encounter (Signed)
Called and discussed results with patient regarding the x-rays and discussed potential for nerve conduction study. At this point we are limited with her resources and no surgical intervention options. We will plan to defer nerve conduction studies at this timewithout first trying physical therapy unless she has any clinical worsening.  Also discussed compression socks for lower extremity swelling and have provided a prescription for this. Prescription printed and left in front office for patient to pick up.  She'll followup as scheduled.

## 2014-02-01 NOTE — Telephone Encounter (Signed)
Message copied by Gerda Diss on Wed Feb 01, 2014 11:55 AM ------      Message from: Carolyne Littles      Created: Tue Jan 31, 2014  8:27 AM      Regarding: xray results      Contact: [phone]       Pt called for Back xray results.  She also wants to maybe discuss doing a nerve conduction study per her Physical therapist. ------

## 2014-02-06 ENCOUNTER — Ambulatory Visit: Payer: No Typology Code available for payment source | Admitting: Physical Therapy

## 2014-02-08 ENCOUNTER — Ambulatory Visit: Payer: No Typology Code available for payment source | Admitting: Physical Therapy

## 2014-02-14 ENCOUNTER — Ambulatory Visit: Payer: No Typology Code available for payment source | Attending: Sports Medicine

## 2014-02-14 DIAGNOSIS — I10 Essential (primary) hypertension: Secondary | ICD-10-CM | POA: Insufficient documentation

## 2014-02-14 DIAGNOSIS — M546 Pain in thoracic spine: Secondary | ICD-10-CM | POA: Insufficient documentation

## 2014-02-14 DIAGNOSIS — Z5189 Encounter for other specified aftercare: Secondary | ICD-10-CM | POA: Insufficient documentation

## 2014-02-14 DIAGNOSIS — M545 Low back pain: Secondary | ICD-10-CM | POA: Insufficient documentation

## 2014-02-14 DIAGNOSIS — M2550 Pain in unspecified joint: Secondary | ICD-10-CM | POA: Insufficient documentation

## 2014-02-14 DIAGNOSIS — Z96652 Presence of left artificial knee joint: Secondary | ICD-10-CM | POA: Insufficient documentation

## 2014-02-21 ENCOUNTER — Encounter: Payer: No Typology Code available for payment source | Admitting: Physical Therapy

## 2014-02-23 ENCOUNTER — Encounter: Payer: No Typology Code available for payment source | Admitting: Physical Therapy

## 2014-02-27 ENCOUNTER — Encounter (HOSPITAL_COMMUNITY): Payer: Self-pay | Admitting: Emergency Medicine

## 2014-02-27 ENCOUNTER — Emergency Department (INDEPENDENT_AMBULATORY_CARE_PROVIDER_SITE_OTHER)
Admission: EM | Admit: 2014-02-27 | Discharge: 2014-02-27 | Disposition: A | Discharge status: Home / Self Care | Payer: No Typology Code available for payment source | Source: Home / Self Care

## 2014-02-27 DIAGNOSIS — M940 Chondrocostal junction syndrome [Tietze]: Secondary | ICD-10-CM

## 2014-02-27 DIAGNOSIS — K2100 Gastro-esophageal reflux disease with esophagitis, without bleeding: Secondary | ICD-10-CM

## 2014-02-27 DIAGNOSIS — R1906 Epigastric swelling, mass or lump: Secondary | ICD-10-CM

## 2014-02-27 HISTORY — DX: Essential (primary) hypertension: I10

## 2014-02-27 MED ORDER — METOCLOPRAMIDE HCL 10 MG PO TABS: 10.0000 mg | ORAL_TABLET | Freq: Three times a day (TID) | ORAL | Status: DC

## 2014-02-27 NOTE — ED Provider Notes (Signed)
CSN: [id number]     Arrival date & time 02/27/14  1454 History   First MD Initiated Contact with Patient 02/27/14 1508     No chief complaint on file.  (Consider location/radiation/quality/duration/timing/severity/associated sxs/prior Treatment) HPI Comments: 53 year old female has a history of GERD. She is currently taking omeprazole 40 mg twice a day. Last week he had episodes of esophageal reflux with burning substernally and into the throat. It was worse after eating and while supine. Her appetite has been decreased due to expectations of symptoms upon eating. She is compliant with her medications but they do not seem to be as effective as they were in the past. She is also concerned about a "knot" in the epigastric area. Denies abdominal pain.  Second complaint is that of a discomfort around the lower costal margins. It is most painful when taking a deep breath and when she patent and describes it as a heaviness or breaks across her lower chest. Denies precordial or upper chest pain. Denies shortness of breath but does state taking a deep breath reproduces the tightness across the lower costal margins. She does not have a known cardiac history. She denies vomiting, diaphoresis or shortness of breath.   Past Medical History  Diagnosis Date  . Hypertension    Past Surgical History  Procedure Laterality Date  . Total knee arthroplasty  2007   Family History  Problem Relation Age of Onset  . Cancer Father     lung  . Breast cancer Sister   . Cancer Maternal Grandfather     lung  . Cancer Paternal Grandfather     lung   History  Substance Use Topics  . Smoking status: Never Smoker   . Smokeless tobacco: Never Used  . Alcohol Use: No   OB History   Grav Para Term Preterm Abortions TAB SAB Ect Mult Living   6 5 5  1  1   5      Review of Systems  Constitutional: Positive for appetite change. Negative for fever, activity change and fatigue.  HENT: Negative.   Respiratory:  Negative for cough, shortness of breath and wheezing.   Cardiovascular: Positive for chest pain. Negative for palpitations and leg swelling.  Gastrointestinal: Negative for vomiting, abdominal pain and abdominal distention.       As per history of present illness  Genitourinary: Negative.   Musculoskeletal: Negative.   Skin: Negative.   Neurological: Negative.     Allergies  Review of patient's allergies indicates no known allergies.  Home Medications   Prior to Admission medications   Medication Sig Start Date End Date Taking? Authorizing Provider  atorvastatin (LIPITOR) 10 MG tablet Take 1 tablet (10 mg total) by mouth daily at 6 PM. 07/12/13  Yes Reyne Dumas, MD  cyclobenzaprine (FLEXERIL) 5 MG tablet Take 1 tablet (5 mg total) by mouth 3 (three) times daily as needed for muscle spasms. 10/18/13  Yes Harden Mo, MD  ibuprofen (ADVIL,MOTRIN) 800 MG tablet Take 1 tablet (800 mg total) by mouth every 8 (eight) hours as needed. 10/26/13  Yes Theodis Blaze, MD  lisinopril (PRINIVIL,ZESTRIL) 40 MG tablet Take 1 tablet (40 mg total) by mouth daily. 07/12/13  Yes Reyne Dumas, MD  omeprazole (PRILOSEC) 40 MG capsule TAKE 1 CAPSULE BY MOUTH TWICE DAILY 01/26/14  Yes Reyne Dumas, MD  traZODone (DESYREL) 50 MG tablet TAKE 1/2 TO 1 TABLETS BY MOUTH AT BEDTIME AS NEEDED FOR SLEEP.   Yes Lorayne Marek, MD  metoCLOPramide (REGLAN)  10 MG tablet Take 1 tablet (10 mg total) by mouth 4 (four) times daily -  before meals and at bedtime. 02/27/14   Janne Napoleon, NP   BP 157/102  Pulse 90  Temp(Src) 98 F (36.7 C) (Oral)  Resp 16  SpO2 99%  LMP 02/07/2013 Physical Exam  Nursing note and vitals reviewed. Constitutional: She is oriented to person, place, and time. She appears well-developed and well-nourished. No distress.  Eyes: Conjunctivae and EOM are normal.  Neck: Normal range of motion. Neck supple.  Cardiovascular: Normal rate, normal heart sounds and intact distal pulses.   No murmur  heard. Pulmonary/Chest: Effort normal and breath sounds normal. No respiratory distress. She has no wheezes. She has no rales. She exhibits tenderness.  There is marked, reproducible chest wall tenderness along the left sternal border continuing inferiorly along the bilateral costal margins. Palpation produces pain that the patient states is identical to the pain and discomfort for which she presents.  Abdominal: Soft. Bowel sounds are normal. She exhibits mass. She exhibits no distension. There is tenderness. There is no rebound and no guarding.  Minor tenderness in the epigastrium. Palpation reveals a 1 cm nodule deep in the epigastrium. It is mildly tender. There is no bulging or asymmetry to the epigastrium or other aspects of the abdomen. Remainder of the abdomen is nontender, soft and asymptomatic.  Musculoskeletal: She exhibits no edema and no tenderness.  Lymphadenopathy:    She has no cervical adenopathy.  Neurological: She is alert and oriented to person, place, and time. She exhibits normal muscle tone.  Skin: Skin is warm and dry.  Psychiatric: She has a normal mood and affect.    ED Course  Procedures (including critical care time) Labs Review Labs Reviewed - No data to display  Imaging Review No results found.   MDM   1. Reflux esophagitis   2. Epigastric swelling, mass or lump   3. Costochondritis, acute    Suspect the epigastric nodule is due to LES hypertrophy or inflammation. As below she will need to see GI upset as possible likely needing to have upper endoscopy. Ice to ribs and tylenol prn Add Zantac to Omeprazole 40 bid Bland diet, small amts, no acidic, spicey foods Reglan 10 mg tid hs prn reflux F/U with PCP soon  will need refer to GI For any worsening symptoms or problems such as anterior chest pain, heaviness, tightness, fullness, pressure, vomiting, diaphoresis, shortness of breath or other worrisome symptoms go directly to the emergency department or  call EMS. He said other red flags were discussed with the patient in detail.   Janne Napoleon, NP 02/27/14 [place], NP 02/27/14 [phone]

## 2014-02-27 NOTE — ED Notes (Signed)
Painful lump in epigastric area onset Fri.  She had heartburn 1 1/2 weeks and took her Omeprazole.  C/o heaviness under her breast and radiates around to her back. Occasional SOB.  No nauesa or sweating.

## 2014-02-27 NOTE — Discharge Instructions (Signed)
Chest Wall Pain Chest wall pain is pain in or around the bones and muscles of your chest. It may take up to 6 weeks to get better. It may take longer if you must stay physically active in your work and activities.  CAUSES  Chest wall pain may happen on its own. However, it may be caused by:  A viral illness like the flu.  Injury.  Coughing.  Exercise.  Arthritis.  Fibromyalgia.  Shingles. HOME CARE INSTRUCTIONS   Avoid overtiring physical activity. Try not to strain or perform activities that cause pain. This includes any activities using your chest or your abdominal and side muscles, especially if heavy weights are used.  Put ice on the sore area.  Put ice in a plastic bag.  Place a towel between your skin and the bag.  Leave the ice on for 15-20 minutes per hour while awake for the first 2 days.  Only take over-the-counter or prescription medicines for pain, discomfort, or fever as directed by your caregiver. SEEK IMMEDIATE MEDICAL CARE IF:   Your pain increases, or you are very uncomfortable.  You have a fever.  Your chest pain becomes worse.  You have new, unexplained symptoms.  You have nausea or vomiting.  You feel sweaty or lightheaded.  You have a cough with phlegm (sputum), or you cough up blood. MAKE SURE YOU:   Understand these instructions.  Will watch your condition.  Will get help right away if you are not doing well or get worse. Document Released: 04/28/2005 Document Revised: 07/21/2011 Document Reviewed: 12/23/2010 [facility] Patient Information 2015 [place], [place]. This information is not intended to replace advice given to you by your health care provider. Make sure you discuss any questions you have with your health care provider.  Costochondritis Costochondritis is a condition in which the tissue (cartilage) that connects your ribs with your breastbone (sternum) becomes irritated. It causes pain in the chest and rib area. It usually goes  away on its own over time. HOME CARE  Avoid activities that wear you out.  Do not strain your ribs. Avoid activities that use your:  Chest.  Belly.  Side muscles.  Put ice on the area for the first 2 days after the pain starts.  Put ice in a plastic bag.  Place a towel between your skin and the bag.  Leave the ice on for 20 minutes, 2-3 times a day.  Only take medicine as told by your doctor. GET HELP IF:  You have redness or puffiness (swelling) in the rib area.  Your pain does not go away with rest or medicine. GET HELP RIGHT AWAY IF:   Your pain gets worse.  You are very uncomfortable.  You have trouble breathing.  You cough up blood.  You start sweating or throwing up (vomiting).  You have a fever or lasting symptoms for more than 2-3 days.  You have a fever and your symptoms suddenly get worse. MAKE SURE YOU:   Understand these instructions.  Will watch your condition.  Will get help right away if you are not doing well or get worse. Document Released: 10/15/2007 Document Revised: 12/29/2012 Document Reviewed: 11/30/2012 [facility] Patient Information 2015 [place], [place]. This information is not intended to replace advice given to you by your health care provider. Make sure you discuss any questions you have with your health care provider.  Esophageal Function Studies This is a test to determine how the esophagus is working. The esophagus is the tube which  carries food from your mouth to your stomach. In these studies, there is a measurement of the LES (lower esophageal sphincter) pressure. This is the pressure of the muscles at the bottom of the esophagus that keep food in your stomach. This same muscle group prevents food from returning up the esophagus. This also measures the contraction to determine whether the esophagus is working normally. In this test, other procedures including acid reflux with pH probe is done. The acid reflux with pH probe is a test  which studies acid reflux, the main cause of gastroesophageal reflux (stomach acids refluxing into the lower part of the esophagus). Persons with a dysfunctional LES will reflux acid into the esophagus. This will cause a drop in pH which can be tested by a pH probe. The pH is the level of acidity or alkalinity measured in the stomach contents. Another test often done with these studies is an acid clearing test which is done to determine how many swallows it takes a patient to completely clear hydrochloric acid from the esophagus. If it takes a patient more than 10 swallows, it typically indicates the possibility of esophagitis (inflammation of the esophagus). Another test that is often performed in this series is the [facility] test (acid perfusion). This is a test that will attempt to reproduce (cause) the symptoms of gastroesophageal reflux you have been having. If you develop pain when hydrochloric acid enters the esophagus, the test is positive and proves that the patient's symptoms are most likely caused by acid reflux. If there is no pain or discomfort, more testing may be done to determine the cause for the your symptoms.  PREPARATION FOR TEST Nothing to eat or drink for at least 8 hours prior to the test. NORMAL FINDINGS  Lower esophageal sphincter pressure: 10-20 mm Hg  Swallowing pattern: normal peristaltic waves  Acid reflux: negative  Acid clearing: less than 10 swallows  Bernstein test: negative Ranges for normal findings may vary among different laboratories and hospitals. You should always check with your doctor after having lab work or other tests done to discuss the meaning of your test results and whether your values are considered within normal limits. MEANING OF TEST  Your caregiver will go over the test results with you and discuss the importance and meaning of your results, as well as treatment options and the need for additional tests if necessary. OBTAINING THE TEST RESULTS    It is your responsibility to obtain your test results. Ask the lab or department performing the test when and how you will get your results. Document Released: 08/29/2004 Document Revised: 07/21/2011 Document Reviewed: 04/07/2008 [facility] Patient Information 2015 [place], [place]. This information is not intended to replace advice given to you by your health care provider. Make sure you discuss any questions you have with your health care provider.  Esophagitis Add  Zantac 150 mg twice a day Esophagitis is inflammation of the esophagus. It can involve swelling, soreness, and pain in the esophagus. This condition can make it difficult and painful to swallow. CAUSES  Most causes of esophagitis are not serious. Many different factors can cause esophagitis, including:  Gastroesophageal reflux disease (GERD). This is when acid from your stomach flows up into the esophagus.  Recurrent vomiting.  An allergic-type reaction.  Certain medicines, especially those that come in large pills.  Ingestion of harmful chemicals, such as household cleaning products.  Heavy alcohol use.  An infection of the esophagus.  Radiation treatment for cancer.  Certain diseases such  as sarcoidosis, Crohn's disease, and scleroderma. These diseases may cause recurrent esophagitis. SYMPTOMS   Trouble swallowing.  Painful swallowing.  Chest pain.  Difficulty breathing.  Nausea.  Vomiting.  Abdominal pain. DIAGNOSIS  Your caregiver will take your history and do a physical exam. Depending upon what your caregiver finds, certain tests may also be done, including:  Barium X-ray. You will drink a solution that coats the esophagus, and X-rays will be taken.  Endoscopy. A lighted tube is put down the esophagus so your caregiver can examine the area.  Allergy tests. These can sometimes be arranged through follow-up visits. TREATMENT  Treatment will depend on the cause of your esophagitis. In some cases,  steroids or other medicines may be given to help relieve your symptoms or to treat the underlying cause of your condition. Medicines that may be recommended include:  Viscous lidocaine, to soothe the esophagus.  Antacids.  Acid reducers.  Proton pump inhibitors.  Antiviral medicines for certain viral infections of the esophagus.  Antifungal medicines for certain fungal infections of the esophagus.  Antibiotic medicines, depending on the cause of the esophagitis. HOME CARE INSTRUCTIONS   Avoid foods and drinks that seem to make your symptoms worse.  Eat small, frequent meals instead of large meals.  Avoid eating for the 3 hours prior to your bedtime.  If you have trouble taking pills, use a pill splitter to decrease the size and likelihood of the pill getting stuck or injuring the esophagus on the way down. Drinking water after taking a pill also helps.  Stop smoking if you smoke.  Maintain a healthy weight.  Wear loose-fitting clothing. Do not wear anything tight around your waist that causes pressure on your stomach.  Raise the head of your bed 6 to 8 inches with wood blocks to help you sleep. Extra pillows will not help.  Only take over-the-counter or prescription medicines as directed by your caregiver. SEEK IMMEDIATE MEDICAL CARE IF:  You have severe chest pain that radiates into your arm, neck, or jaw.  You feel sweaty, dizzy, or lightheaded.  You have shortness of breath.  You vomit blood.  You have difficulty or pain with swallowing.  You have bloody or black, tarry stools.  You have a fever.  You have a burning sensation in the chest more than 3 times a week for more than 2 weeks.  You cannot swallow, drink, or eat.  You drool because you cannot swallow your saliva. MAKE SURE YOU:  Understand these instructions.  Will watch your condition.  Will get help right away if you are not doing well or get worse. Document Released: 06/05/2004 Document  Revised: 07/21/2011 Document Reviewed: 12/27/2010 [facility] Patient Information 2015 [place], [place]. This information is not intended to replace advice given to you by your health care provider. Make sure you discuss any questions you have with your health care provider.

## 2014-02-28 ENCOUNTER — Encounter: Payer: No Typology Code available for payment source | Admitting: Physical Therapy

## 2014-03-01 ENCOUNTER — Ambulatory Visit (INDEPENDENT_AMBULATORY_CARE_PROVIDER_SITE_OTHER): Payer: No Typology Code available for payment source | Admitting: Sports Medicine

## 2014-03-01 ENCOUNTER — Other Ambulatory Visit: Payer: Self-pay | Admitting: Sports Medicine

## 2014-03-01 ENCOUNTER — Ambulatory Visit
Admission: RE | Admit: 2014-03-01 | Discharge: 2014-03-01 | Disposition: A | Discharge status: Home / Self Care | Payer: No Typology Code available for payment source | Source: Ambulatory Visit | Attending: Sports Medicine | Admitting: Sports Medicine

## 2014-03-01 VITALS — BP 152/96 | Ht 64.0 in | Wt 200.0 lb

## 2014-03-01 DIAGNOSIS — M7742 Metatarsalgia, left foot: Principal | ICD-10-CM

## 2014-03-01 DIAGNOSIS — M7741 Metatarsalgia, right foot: Secondary | ICD-10-CM

## 2014-03-01 DIAGNOSIS — M47895 Other spondylosis, thoracolumbar region: Secondary | ICD-10-CM

## 2014-03-01 NOTE — Progress Notes (Signed)
  [name] - 53 y.o. female MRN [id number]  Date of birth: Jun 08, 1960  CC & HPI:  The patient is here to follow up 2 separate issues: Low back pain: Her bilateral low back pain is significantly better since her last visit but she still associates this with worsening leg and foot pain. Her back pain does not limit her daily function and has been somewhat better since starting physical therapy but she discontinued this do to no improvement in her foot and lower leg pain. Pt continues to deny any change in bowel or bladder habits, numbness or falls associated with back pain.  No fevers, chills, night sweats or weight loss.  Bilateral forefoot pain: Patient reports severe forefoot pain with weightbearing, walking or any type of regular daily activity. This is significantly limiting her daily function. She reportedly tried metatarsal pads but was placing them over the ball of the foot causing more pain. She associates of foot pain with her back pain but no true radicular symptoms.  ROS:  Per HPI.   OBJECTIVE FINDINGS:  VS:   HT:5\' 4"  (162.6 cm)   WT:200 lb (90.719 kg)  BMI:34.4          BP:152/96 mmHg  HR: bpm  TEMP: ( )  RESP:   PHYSICAL EXAM: GENERAL:  adult Caucasian female. In no discomfort; no respiratory distress   PSYCH: alert and appropriate, good insight   NEURO: Sensation is intact to light touch in bilateral lower extremities, no dysesthesias   VASCULAR:  bilateral DP and PT pulses 1/4.  No significant edema.    Back EXAM: Appearance:  overall normal-alignment   Palpation: TTP over: Bilateral paraspinal musculature  No TTP over: Midline   Strength & ROM:  able to heel and toe walk without difficulty but does have pain in the ball of the foot with this   Special Tests:  bilateral negative straight leg raise    BILATERAL FOOT EXAM: Appearance:  claw toe deformities bilaterally left second toe greater than right Longitudinal Arch: High Transverse Arch: Collapse bilaterally with  Morton's callus right greater than left Calcaneous position with weight bearing: Neutral   Skin: No overlying erythema/ecchymosis.  Palpation: TTP over: Metatarsal heads bilaterally  No TTP over: Intermetatarsal spaces Metatarsal Squeeze Test: Negative bilaterally   Special Tests: Repeat Heel Raise: Normal PT recruitment     ASSESSMENT: 1. Metatarsalgia of both feet   2. Other osteoarthritis of spine, thoracolumbar region    Overall her back pain is better and less inclined to think this is a radicular etiology and more so 2 separate issues with osteoarthritis of her lumbar spine and metatarsalgia. She was improperly using metatarsal pads previously. Overall her lower sugar swelling seems to be better and she's had reassuring vascular studies.  PLAN: See problem based charting & AVS for additional documentation. - Small metatarsal pads appropriately placed and her everyday shoes today. - Continue HEP provided by physical therapy for her lumbar spine. > Return in about 3 weeks (around 03/22/2014) for custom orthotics.

## 2014-03-01 NOTE — ED Provider Notes (Signed)
Medical screening examination/treatment/procedure(s) were performed by resident physician or non-physician practitioner and as supervising physician I was immediately available for consultation/collaboration.   Pauline Good MD.   Billy Fischer, MD 03/01/14 2031

## 2014-03-02 ENCOUNTER — Encounter: Payer: No Typology Code available for payment source | Admitting: Physical Therapy

## 2014-03-07 ENCOUNTER — Encounter: Payer: Self-pay | Admitting: Internal Medicine

## 2014-03-07 ENCOUNTER — Ambulatory Visit: Payer: No Typology Code available for payment source | Attending: Internal Medicine | Admitting: Internal Medicine

## 2014-03-07 VITALS — BP 140/90 | HR 80 | Temp 98.0°F | Resp 16 | Wt 205.0 lb

## 2014-03-07 DIAGNOSIS — Z1211 Encounter for screening for malignant neoplasm of colon: Secondary | ICD-10-CM

## 2014-03-07 DIAGNOSIS — K219 Gastro-esophageal reflux disease without esophagitis: Secondary | ICD-10-CM | POA: Insufficient documentation

## 2014-03-07 DIAGNOSIS — Z2821 Immunization not carried out because of patient refusal: Secondary | ICD-10-CM | POA: Insufficient documentation

## 2014-03-07 DIAGNOSIS — I10 Essential (primary) hypertension: Secondary | ICD-10-CM | POA: Insufficient documentation

## 2014-03-07 DIAGNOSIS — R1013 Epigastric pain: Secondary | ICD-10-CM | POA: Insufficient documentation

## 2014-03-07 MED ORDER — RANITIDINE HCL 150 MG PO TABS: 150.0000 mg | ORAL_TABLET | Freq: Every day | ORAL | Status: DC

## 2014-03-07 NOTE — Progress Notes (Signed)
Patient states she was seen over at the urgent care for a "knot" in  Her upper abdominal area Patient states it feels like a "brick" laying in there Urgent care referred her back to her primary doctor for evaluation

## 2014-03-07 NOTE — Progress Notes (Signed)
MRN: [id number] Name: [name]  Sex: female Age: 53 y.o. DOB: May 03, 1961  Allergies: Review of patient's allergies indicates no known allergies.  Chief Complaint  Patient presents with  . Abdominal Pain    HPI: Patient is 53 y.o. female who has history of hypertension, GERD, recently  Went to urgent care with worsening symptoms of reflux as well as epigastric pain and was feeling and knot in her abdomen, EMR reviewed patient was prescribed Reglan as per patient he didn't help her much denies any nausea vomiting denies any change in bowel habits, today her blood pressure is elevated her, repeat manual blood pressure is 140/90, she is on lisinopril 40 mg again advised patient for DASH diet. Patient never had a colonoscopy done.  Past Medical History  Diagnosis Date  . Hypertension     Past Surgical History  Procedure Laterality Date  . Total knee arthroplasty  2007      Medication List       This list is accurate as of: 03/07/14  3:18 PM.  Always use your most recent med list.               atorvastatin 10 MG tablet  Commonly known as:  LIPITOR  Take 1 tablet (10 mg total) by mouth daily at 6 PM.     cyclobenzaprine 5 MG tablet  Commonly known as:  FLEXERIL  Take 1 tablet (5 mg total) by mouth 3 (three) times daily as needed for muscle spasms.     ibuprofen 800 MG tablet  Commonly known as:  ADVIL,MOTRIN  Take 1 tablet (800 mg total) by mouth every 8 (eight) hours as needed.     lisinopril 40 MG tablet  Commonly known as:  PRINIVIL,ZESTRIL  Take 1 tablet (40 mg total) by mouth daily.     metoCLOPramide 10 MG tablet  Commonly known as:  REGLAN  Take 1 tablet (10 mg total) by mouth 4 (four) times daily -  before meals and at bedtime.     omeprazole 40 MG capsule  Commonly known as:  PRILOSEC  TAKE 1 CAPSULE BY MOUTH TWICE DAILY     ranitidine 150 MG tablet  Commonly known as:  ZANTAC  Take 1 tablet (150 mg total) by mouth at bedtime.     traZODone 50  MG tablet  Commonly known as:  DESYREL  TAKE 1/2 TO 1 TABLETS BY MOUTH AT BEDTIME AS NEEDED FOR SLEEP.        Meds ordered this encounter  Medications  . ranitidine (ZANTAC) 150 MG tablet    Sig: Take 1 tablet (150 mg total) by mouth at bedtime.    Dispense:  30 tablet    Refill:  3    Immunization History  Administered Date(s) Administered  . Tdap 04/29/2013    Family History  Problem Relation Age of Onset  . Cancer Father     lung  . Breast cancer Sister   . Cancer Maternal Grandfather     lung  . Cancer Paternal Grandfather     lung    History  Substance Use Topics  . Smoking status: Never Smoker   . Smokeless tobacco: Never Used  . Alcohol Use: No    Review of Systems   As noted in HPI  Filed Vitals:   03/07/14 1511  BP: 140/90  Pulse:   Temp:   Resp:     Physical Exam  Physical Exam  Constitutional: No distress.  Eyes: EOM  are normal. Pupils are equal, round, and reactive to light.  Cardiovascular: Normal rate and regular rhythm.   Pulmonary/Chest: Breath sounds normal. No respiratory distress. She has no wheezes. She has no rales.  Abdominal: There is no rebound and no guarding.  Epigastric tenderness with deep palpation   Musculoskeletal: She exhibits no edema.    CBC    Component Value Date/Time   WBC 7.0 04/13/2013 1242   RBC 4.33 04/13/2013 1242   HGB 13.0 04/13/2013 1242   HCT 38.9 04/13/2013 1242   PLT 323 04/13/2013 1242   MCV 89.8 04/13/2013 1242   LYMPHSABS 2.6 04/13/2013 1242   MONOABS 0.8 04/13/2013 1242   EOSABS 0.1 04/13/2013 1242   BASOSABS 0.0 04/13/2013 1242    CMP     Component Value Date/Time   NA 137 12/26/2013 1459   K 5.0 12/26/2013 1459   CL 104 12/26/2013 1459   CO2 29 12/26/2013 1459   GLUCOSE 113* 12/26/2013 1459   BUN 16 12/26/2013 1459   CREATININE 0.91 12/26/2013 1459   CREATININE 1.0 01/28/2009 1632   CALCIUM 9.3 12/26/2013 1459   PROT 6.7 12/26/2013 1459   ALBUMIN 4.3 12/26/2013 1459   AST 18 12/26/2013 1459    ALT 19 12/26/2013 1459   ALKPHOS 82 12/26/2013 1459   BILITOT 0.3 12/26/2013 1459   GFRNONAA 73 12/26/2013 1459   GFRAA 84 12/26/2013 1459    Lab Results  Component Value Date/Time   CHOL 252* 04/13/2013 12:42 PM    No components found with this basename: hga1c    Lab Results  Component Value Date/Time   AST 18 12/26/2013  2:59 PM    Assessment and Plan  Gastroesophageal reflux disease, esophagitis presence not specified - Plan: Patient will continue with Prilosec, I have added her ranitidine (ZANTAC) 150 MG tablet to take at night also advise for last modification, Ambulatory referral to Gastroenterology  Abdominal pain, epigastric - Plan: Ordered US Abdomen Complete  Special screening for malignant neoplasms, colon - Plan: Ambulatory referral to Gastroenterology  Essential hypertension Patient advised for DASH diet continue with lisinopril, followup her on the next visit if blood pressure is persistently elevated consider adding new medication.  Health Maintenance -Colonoscopy: referred to GI -Pap Smear: uptodate  -Mammogram: uptodate  -Vaccinations: Patient declines flu shot   Return in about 3 months (around 06/07/2014) for hypertension, gerd.  Lorayne Marek, MD

## 2014-03-07 NOTE — Patient Instructions (Signed)
DASH Eating Plan °DASH stands for "Dietary Approaches to Stop Hypertension." The DASH eating plan is a healthy eating plan that has been shown to reduce high blood pressure (hypertension). Additional health benefits may include reducing the risk of type 2 diabetes mellitus, heart disease, and stroke. The DASH eating plan may also help with weight loss. °WHAT DO I NEED TO KNOW ABOUT THE DASH EATING PLAN? °For the DASH eating plan, you will follow these general guidelines: °· Choose foods with a percent daily value for sodium of less than 5% (as listed on the food label). °· Use salt-free seasonings or herbs instead of table salt or sea salt. °· Check with your health care provider or pharmacist before using salt substitutes. °· Eat lower-sodium products, often labeled as "lower sodium" or "no salt added." °· Eat fresh foods. °· Eat more vegetables, fruits, and low-fat dairy products. °· Choose whole grains. Look for the word "whole" as the first word in the ingredient list. °· Choose fish and skinless chicken or turkey more often than red meat. Limit fish, poultry, and meat to 6 oz (170 g) each day. °· Limit sweets, desserts, sugars, and sugary drinks. °· Choose heart-healthy fats. °· Limit cheese to 1 oz (28 g) per day. °· Eat more home-cooked food and less restaurant, buffet, and fast food. °· Limit fried foods. °· Cook foods using methods other than frying. °· Limit canned vegetables. If you do use them, rinse them well to decrease the sodium. °· When eating at a restaurant, ask that your food be prepared with less salt, or no salt if possible. °WHAT FOODS CAN I EAT? °Seek help from a dietitian for individual calorie needs. °Grains °Whole grain or whole wheat bread. Brown rice. Whole grain or whole wheat pasta. Quinoa, bulgur, and whole grain cereals. Low-sodium cereals. Corn or whole wheat flour tortillas. Whole grain cornbread. Whole grain crackers. Low-sodium crackers. °Vegetables °Fresh or frozen vegetables  (raw, steamed, roasted, or grilled). Low-sodium or reduced-sodium tomato and vegetable juices. Low-sodium or reduced-sodium tomato sauce and paste. Low-sodium or reduced-sodium canned vegetables.  °Fruits °All fresh, canned (in natural juice), or frozen fruits. °Meat and Other Protein Products °Ground beef (85% or leaner), grass-fed beef, or beef trimmed of fat. Skinless chicken or turkey. Ground chicken or turkey. Pork trimmed of fat. All fish and seafood. Eggs. Dried beans, peas, or lentils. Unsalted nuts and seeds. Unsalted canned beans. °Dairy °Low-fat dairy products, such as skim or 1% milk, 2% or reduced-fat cheeses, low-fat ricotta or cottage cheese, or plain low-fat yogurt. Low-sodium or reduced-sodium cheeses. °Fats and Oils °Tub margarines without trans fats. Light or reduced-fat mayonnaise and salad dressings (reduced sodium). Avocado. Safflower, olive, or canola oils. Natural peanut or almond butter. °Other °Unsalted popcorn and pretzels. °The items listed above may not be a complete list of recommended foods or beverages. Contact your dietitian for more options. °WHAT FOODS ARE NOT RECOMMENDED? °Grains °White bread. White pasta. White rice. Refined cornbread. Bagels and croissants. Crackers that contain trans fat. °Vegetables °Creamed or fried vegetables. Vegetables in a cheese sauce. Regular canned vegetables. Regular canned tomato sauce and paste. Regular tomato and vegetable juices. °Fruits °Dried fruits. Canned fruit in light or heavy syrup. Fruit juice. °Meat and Other Protein Products °Fatty cuts of meat. Ribs, chicken wings, bacon, sausage, bologna, salami, chitterlings, fatback, hot dogs, bratwurst, and packaged luncheon meats. Salted nuts and seeds. Canned beans with salt. °Dairy °Whole or 2% milk, cream, half-and-half, and cream cheese. Whole-fat or sweetened yogurt. Full-fat   cheeses or blue cheese. Nondairy creamers and whipped toppings. Processed cheese, cheese spreads, or cheese  curds. °Condiments °Onion and garlic salt, seasoned salt, table salt, and sea salt. Canned and packaged gravies. Worcestershire sauce. Tartar sauce. Barbecue sauce. Teriyaki sauce. Soy sauce, including reduced sodium. Steak sauce. Fish sauce. Oyster sauce. Cocktail sauce. Horseradish. Ketchup and mustard. Meat flavorings and tenderizers. Bouillon cubes. Hot sauce. Tabasco sauce. Marinades. Taco seasonings. Relishes. °Fats and Oils °Butter, stick margarine, lard, shortening, ghee, and bacon fat. Coconut, palm kernel, or palm oils. Regular salad dressings. °Other °Pickles and olives. Salted popcorn and pretzels. °The items listed above may not be a complete list of foods and beverages to avoid. Contact your dietitian for more information. °WHERE CAN I FIND MORE INFORMATION? °[employer], [employer], [employer]: www.nhlbi.nih.gov/health/health-topics/topics/dash/ °Document Released: 04/17/2011 Document Revised: 09/12/2013 Document Reviewed: 03/02/2013 °[employer]® Patient Information ©2015 [employer]. This information is not intended to replace advice given to you by your health care provider. Make sure you discuss any questions you have with your health care provider. ° °

## 2014-03-08 ENCOUNTER — Ambulatory Visit: Payer: Self-pay | Admitting: Sports Medicine

## 2014-03-08 ENCOUNTER — Encounter: Payer: Self-pay | Admitting: Internal Medicine

## 2014-03-10 ENCOUNTER — Other Ambulatory Visit: Payer: Self-pay | Admitting: Obstetrics and Gynecology

## 2014-03-10 ENCOUNTER — Ambulatory Visit (HOSPITAL_COMMUNITY)
Admission: RE | Admit: 2014-03-10 | Discharge: 2014-03-10 | Disposition: A | Discharge status: Home / Self Care | Payer: No Typology Code available for payment source | Source: Ambulatory Visit | Attending: Diagnostic Radiology | Admitting: Diagnostic Radiology

## 2014-03-10 ENCOUNTER — Ambulatory Visit (HOSPITAL_COMMUNITY): Payer: No Typology Code available for payment source

## 2014-03-10 DIAGNOSIS — K8020 Calculus of gallbladder without cholecystitis without obstruction: Secondary | ICD-10-CM | POA: Insufficient documentation

## 2014-03-10 DIAGNOSIS — K760 Fatty (change of) liver, not elsewhere classified: Secondary | ICD-10-CM | POA: Insufficient documentation

## 2014-03-10 DIAGNOSIS — Z1231 Encounter for screening mammogram for malignant neoplasm of breast: Secondary | ICD-10-CM

## 2014-03-10 DIAGNOSIS — R1013 Epigastric pain: Secondary | ICD-10-CM

## 2014-03-13 ENCOUNTER — Encounter: Payer: Self-pay | Admitting: Internal Medicine

## 2014-03-17 ENCOUNTER — Telehealth: Payer: Self-pay | Admitting: Internal Medicine

## 2014-03-17 NOTE — Telephone Encounter (Signed)
Patient is calling in to ger the results for the latest lab appointment; please f/U with patient

## 2014-03-20 ENCOUNTER — Telehealth: Payer: Self-pay

## 2014-03-20 NOTE — Telephone Encounter (Signed)
Patient is aware of her ultra sound results And that a referral for general surgery was placed in epic Call transferred to Alinda Sierras to assist with questions she had about referral

## 2014-03-22 ENCOUNTER — Ambulatory Visit: Payer: Self-pay | Admitting: Sports Medicine

## 2014-03-24 ENCOUNTER — Telehealth: Payer: Self-pay | Admitting: Emergency Medicine

## 2014-03-24 DIAGNOSIS — K8020 Calculus of gallbladder without cholecystitis without obstruction: Secondary | ICD-10-CM

## 2014-03-24 NOTE — Telephone Encounter (Signed)
Pt informed Amb General surgery referral placed for multiple gallstones and possible Chole

## 2014-03-29 ENCOUNTER — Ambulatory Visit (INDEPENDENT_AMBULATORY_CARE_PROVIDER_SITE_OTHER): Payer: Self-pay | Admitting: Sports Medicine

## 2014-03-29 ENCOUNTER — Encounter: Payer: Self-pay | Admitting: Sports Medicine

## 2014-03-29 VITALS — BP 142/81 | HR 71 | Ht 64.0 in | Wt 205.0 lb

## 2014-03-29 DIAGNOSIS — M7741 Metatarsalgia, right foot: Secondary | ICD-10-CM

## 2014-03-29 DIAGNOSIS — M7742 Metatarsalgia, left foot: Secondary | ICD-10-CM

## 2014-03-29 NOTE — Progress Notes (Signed)
  [name] - 53 y.o. female MRN [id number]  Date of birth: [birth year]  CC & HPI:  The patient is here to follow up: Bilateral forefoot pain: persistent bilateral forefoot pain, left worse than right.  worsewith weightbearing, walking or any type of regular daily activity. This is significantly limiting her daily function. She has had some improvement with the metatarsals pads she was provided at last visit reports persistent debilitating dysfunction. The prior back pain and she was having seems to come and go but does not seem to be associated with the foot pain. She does report sharp stabbing pain between the second and third toes on the left.  ROS:  Per HPI.   OBJECTIVE FINDINGS:  VS:   HT:5\' 4"  (162.6 cm)   WT:205 lb (92.987 kg)  BMI:35.3          BP:(!) 142/81 mmHg  HR:71bpm  TEMP: ( )  RESP:   PHYSICAL EXAM: GENERAL:  adult Caucasian female. In no discomfort; no respiratory distress   PSYCH: alert and appropriate, good insight   NEURO: Sensation is intact to light touch in bilateral lower extremities, no dysesthesias   VASCULAR:  bilateral DP and PT pulses 1/4.  No significant edema.    BILATERAL FOOT EXAM: Appearance:  claw toe deformities bilaterally left second toe greater than right Longitudinal Arch: High Transverse Arch: Collapse bilaterally with Morton's callus right greater than left. Splay toe deformity between second and third toes on the left. Calcaneous position with weight bearing: Neutral   Skin: No overlying erythema/ecchymosis.  Palpation: TTP over: Metatarsal heads bilaterally  No TTP over: marked intermetatarsal tenderness between second and third toes on the right and the left. Bilateral pain with metatarsal squeeze test    PROCEDURE NOTE: CUSTOM ORTHOTICS The patient was fitted for a standard, cushioned, semi-rigid orthotic. The orthotic was heated & placed on the orthotic stand. The patient was positioned in subtalar neutral position and 10 of ankle  dorsiflexion and weight bearing stance some heated orthotic blank. After completion of the molding a stable paste was applied to the orthotic blank. The orthotic was ground to a stable position for weightbearing. The patient ambulated in these and reported they were comfortable without pressure spots.              BLANK:  Size 8 - Standard Cushioned                 BASE:  Blue EVA      POSTINGS:  Bilateral small metatarsal pad >50% of this 50 minute visit was spent in direct face to face evaluation, measurement and manufacture of custom molded orthotic.    ASSESSMENT: 1. Metatarsalgia of both feet    - marked splay toe deformity with severe metatarsal pain. There is likely some component of a Morton's neuroma the patient is not interested in injection today and would like to try custom orthotics first.  PLAN: See problem based charting & AVS for additional documentation. Custom orthotics as above. > Return in about 4 weeks (around 04/26/2014) for consideration of Morton's neuroma injection if not improved.

## 2014-04-04 ENCOUNTER — Telehealth: Payer: Self-pay | Admitting: Internal Medicine

## 2014-04-04 NOTE — Telephone Encounter (Signed)
Pt. Calling to speak to CMA, please f/u at [phone].

## 2014-04-05 NOTE — Telephone Encounter (Signed)
Pt calling to speak to a clinician regarding referral to gall stone specialist. Please f/u

## 2014-04-05 NOTE — Telephone Encounter (Signed)
Pt calling to speak to a clinician regarding referral to gall stone specialist. Please f/u with pt.

## 2014-04-12 ENCOUNTER — Ambulatory Visit: Payer: No Typology Code available for payment source | Admitting: Gastroenterology

## 2014-04-13 ENCOUNTER — Ambulatory Visit (HOSPITAL_COMMUNITY)
Admission: RE | Admit: 2014-04-13 | Discharge: 2014-04-13 | Disposition: A | Discharge status: Home / Self Care | Payer: Self-pay | Source: Ambulatory Visit | Attending: Obstetrics and Gynecology | Admitting: Obstetrics and Gynecology

## 2014-04-13 ENCOUNTER — Encounter (HOSPITAL_COMMUNITY): Payer: Self-pay

## 2014-04-13 VITALS — BP 116/84 | Ht 64.0 in | Wt 203.4 lb

## 2014-04-13 DIAGNOSIS — Z1239 Encounter for other screening for malignant neoplasm of breast: Secondary | ICD-10-CM

## 2014-04-13 DIAGNOSIS — Z1231 Encounter for screening mammogram for malignant neoplasm of breast: Secondary | ICD-10-CM

## 2014-04-13 HISTORY — DX: Hyperlipidemia, unspecified: E78.5

## 2014-04-13 HISTORY — DX: Gastro-esophageal reflux disease without esophagitis: K21.9

## 2014-04-13 NOTE — Patient Instructions (Signed)
Explained to [name] that she did not need a Pap smear today due to last Pap smear was 03/01/2013. Let her know [facility] will cover Pap smears every 3 years unless has a history of abnormal Pap smears. Let [facility] know [facility] will [facility] with her within the next couple weeks with results by letter or phone. Mirian Mo Dow verbalized understanding. Patient escorted to mammography for a screening mammogram.  [place]annock, Arvil Chaco, RN 2:19 PM

## 2014-04-13 NOTE — Progress Notes (Signed)
No complaints today.  Pap Smear:  Pap smear not completed today. Last Pap smear was 03/01/2013 at [facility] and normal. Per patient has no history of an abnormal Pap smear. Last Pap smear result is in EPIC.  Physical exam: Breasts Breasts symmetrical. No skin abnormalities bilateral breasts. No nipple retraction bilateral breasts. No nipple discharge bilateral breasts. No lymphadenopathy. No lumps palpated bilateral breasts. No complaints of pain or tenderness on exam. Patient escorted to mammography for a screening mammogram.   Pelvic/Bimanual No Pap smear completed today since last Pap smear was 03/01/2013. Pap smear not indicated per [facility] guidelines.

## 2014-04-17 ENCOUNTER — Other Ambulatory Visit: Payer: Self-pay | Admitting: Obstetrics and Gynecology

## 2014-04-17 DIAGNOSIS — R928 Other abnormal and inconclusive findings on diagnostic imaging of breast: Secondary | ICD-10-CM

## 2014-04-19 ENCOUNTER — Ambulatory Visit: Payer: No Typology Code available for payment source | Attending: Internal Medicine

## 2014-04-21 ENCOUNTER — Other Ambulatory Visit: Payer: Self-pay | Admitting: Internal Medicine

## 2014-04-26 ENCOUNTER — Encounter: Payer: Self-pay | Admitting: Sports Medicine

## 2014-04-26 ENCOUNTER — Ambulatory Visit (INDEPENDENT_AMBULATORY_CARE_PROVIDER_SITE_OTHER): Payer: Self-pay | Admitting: Sports Medicine

## 2014-04-26 VITALS — BP 148/103 | Ht 64.0 in | Wt 200.0 lb

## 2014-04-26 DIAGNOSIS — M7742 Metatarsalgia, left foot: Secondary | ICD-10-CM

## 2014-04-26 DIAGNOSIS — M47895 Other spondylosis, thoracolumbar region: Secondary | ICD-10-CM

## 2014-04-26 DIAGNOSIS — M7741 Metatarsalgia, right foot: Secondary | ICD-10-CM

## 2014-04-26 MED ORDER — AMITRIPTYLINE HCL 50 MG PO TABS: 50.0000 mg | ORAL_TABLET | Freq: Every day | ORAL | Status: DC

## 2014-04-26 NOTE — Progress Notes (Signed)
[name] - 53 y.o. female MRN [id number]  Date of birth: 1961-02-09  CC & HPI:  [name] is here to follow-up: BILATERAL FOOT PAIN: overall patient reports that her bilateral first webspace and metatarsal pain is significantly worse than her last visit. She reports it intermittently has been flaring up and after walking a prolonged period yesterday she is having significantly worsening pain. She denies any significant symptoms of radiating from her back into her buttock or into her feet but mainly the pain is over the bilateral balls of her feet and first webspace. She reports it as a sharp shooting stabbing pain. She denies any numbness or tingling. She has been wearing the orthotics that were fabricated last visit but feels like these are cramming her shoe that may be contributing.  ROS:  Per HPI.   HISTORY: Past Medical, Surgical, Social, and Family History Reviewed & Updated per EMR.   OBJECTIVE:  VS:   HT:5\' 4"  (162.6 cm)   WT:200 lb (90.719 kg)  BMI:34.4          BP:(!) 148/103 mmHg  HR: bpm  TEMP: ( )  RESP:   PHYSICAL EXAM: GENERAL: Adult caucasian  female. In no discomfort; no respiratory distress   PSYCH: alert and appropriate, good insight   NEURO: sensation is intact to light touch in bilateral LE  VASCULAR: B DP and PT pulses 2+/4.  No significant edema.   bilateral foot exam Morton's callus is improved. She does have claw toe deformity of the second toe bilaterally. She has transverse arch collapse. Minimal tenderness palpation over the plantar fascia, first MTP and third through fifth MTP. She does have significant pain over the second MTP and significant pain with the palpation between the first and second interspace. She has pain with metatarsal squeeze test.  ASSESSMENT: 1. Metatarsalgia of both feet   2. Other osteoarthritis of spine, thoracolumbar region    Given the distribution over the first webspace concern for potential L5 radicular symptoms but given  significant reproduction of pain at the site seems less consistent with a central process.  PLAN: See problem based charting & AVS for additional documentation.  - removed the additional metatarsal pads that were added to her custom orthotics and we have placed first ray post bilaterally to see if this is helpful.   She does have associated sleep disturbance and given the symptoms that are consistent with neuropathic pain will start her on amitriptyline 50 mg daily at bedtime. Instructed to titrate this from 25 mg over the next week.   I am concerned that this may be more presentation of lumbar radiculitis from L5 distribution given the focality of the interspace pain.   We did offer first webspace injection once again today but she would like to defer this.   We'll see if she does with changes orthotics today, improvement and shoe fit and instructed her to obtain a pair of shoes with a wide toe box such as new balance that are properly fit with one full thumb length between the into the shoe and the toe.  > Consider MRI at that time of the foot versus diagnostic and therapeutic injection of the foot. Although I do not favor this given she is having bilateral symptoms.   Additionally today we also briefly discussed her workup at [facility] and I briefly reviewed these records with her.  She is being worked up for an incidental adrenal mass and is due to Screening Colonoscopy and  EGD as well as further advanced imaging.  I don't suspect that these entities are related but encouraged her to keep her follow up appointments with them.  > Return in about 6 weeks (around 06/07/2014).

## 2014-05-01 DIAGNOSIS — D3500 Benign neoplasm of unspecified adrenal gland: Secondary | ICD-10-CM | POA: Insufficient documentation

## 2014-05-01 DIAGNOSIS — K449 Diaphragmatic hernia without obstruction or gangrene: Secondary | ICD-10-CM | POA: Insufficient documentation

## 2014-05-01 DIAGNOSIS — N63 Unspecified lump in unspecified breast: Secondary | ICD-10-CM | POA: Insufficient documentation

## 2014-05-03 ENCOUNTER — Ambulatory Visit
Admission: RE | Admit: 2014-05-03 | Discharge: 2014-05-03 | Disposition: A | Discharge status: Home / Self Care | Payer: No Typology Code available for payment source | Source: Ambulatory Visit | Attending: Obstetrics and Gynecology | Admitting: Obstetrics and Gynecology

## 2014-05-03 DIAGNOSIS — R928 Other abnormal and inconclusive findings on diagnostic imaging of breast: Secondary | ICD-10-CM

## 2014-05-09 ENCOUNTER — Encounter (HOSPITAL_COMMUNITY): Payer: Self-pay | Admitting: *Deleted

## 2014-05-09 ENCOUNTER — Emergency Department (INDEPENDENT_AMBULATORY_CARE_PROVIDER_SITE_OTHER)
Admission: EM | Admit: 2014-05-09 | Discharge: 2014-05-09 | Disposition: A | Discharge status: Home / Self Care | Payer: Self-pay | Source: Home / Self Care | Attending: Emergency Medicine | Admitting: Emergency Medicine

## 2014-05-09 DIAGNOSIS — M7121 Synovial cyst of popliteal space [Baker], right knee: Secondary | ICD-10-CM

## 2014-05-09 LAB — D-DIMER, QUANTITATIVE (NOT AT ARMC): D DIMER QUANT: 0.45 ug{FEU}/mL (ref 0.00–0.48)

## 2014-05-09 MED ORDER — MELOXICAM 15 MG PO TABS: 15.0000 mg | ORAL_TABLET | Freq: Every day | ORAL | Status: DC

## 2014-05-09 MED ORDER — TRAMADOL HCL 50 MG PO TABS: 100.0000 mg | ORAL_TABLET | Freq: Three times a day (TID) | ORAL | Status: DC | PRN

## 2014-05-09 NOTE — ED Provider Notes (Signed)
Chief Complaint   Leg Pain   History of Present Illness   [name] is a 53 year old female who has had a three-day history of pain in the right calf. This began in the popliteal fossa and radiated down to the calf. It became worse yesterday. She has pain with ambulation and weightbearing. She denies any injury or swelling. 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 She denies any chest pain or shortness of breath. There's been no fever or chills. She's had no long car trips, plane trips, or train trips. She denies any use of estrogen or history of cancer. No history of pulmonary embolism.  Review of Systems   Other than as noted above, the patient denies any of the following symptoms: Systemic:  No fever, chills, weight gain or loss. Respiratory:  No coughing, wheezing, or shortness of breath. Cardiac:  No chest pain, tightness, pressure or syncope. GI:  No abdominal pain, swelling, distension, nausea, or vomiting. GU:  No dysuria, frequency, or hematuria. Ext:  No joint pain or muscle pain.  [name]   Past medical history, family history, social history, meds, and allergies were reviewed.  She has a history of high blood pressure and GERD. She takes lisinopril and Prilosec.  Physical Examination     Vital signs:  BP 162/118 mmHg  Pulse 102  Temp(Src) 98.2 F (36.8 C) (Oral)  Resp 16  SpO2 100%  LMP 02/07/2013 Gen:  Alert, oriented, in no distress. Neck:  No tenderness, adenopathy, or JVD. Lungs:  Breath sounds clear and equal bilaterally.  No rales, rhonchi or wheezes. Heart:  Regular rhythm, no gallops or murmers. Abdomen:  Soft, nontender, no organomegaly or mass. Ext:  There is no swelling of the calf. Circumference is 43.5 cm on the right and 44 cm on the left. There are no distended varicose veins or pitting edema. She has mild calf tenderness to palpation. Homans sign is positive. There is no pain to palpation all over the thigh.  Pedal pulses are full. Neuro:  Alert and oriented times 3.  No muscle weakness.  Sensation intact to light touch. Skin:  Warm and dry.  No rash or skin lesions.  Labs   Results for orders placed or performed during the hospital encounter of 05/09/14  D-dimer, quantitative  Result Value Ref Range   D-Dimer, Quant 0.45 0.00 - 0.48 ug/mL-FEU    Assessment   The encounter diagnosis was Baker's cyst, right.  With normal d-dimer, and a Wells score of 1, DVT is highly unlikely. Most likely diagnosis is a Baker's cyst which is leaking. Will need follow-up with her orthopedist, Dr. Micheline Chapman next week. In the meantime she is to stay off her legs elevated her foot and apply ice.  Plan   1.  Meds:  The following meds were prescribed:   Discharge Medication List as of 05/09/2014  6:24 PM    START taking these medications   Details       meloxicam (MOBIC) 15 MG tablet Take 1 tablet (15 mg total) by mouth daily., Starting 05/09/2014, Until Discontinued, Normal    traMADol (ULTRAM) 50 MG tablet Take 2 tablets (100 mg total) by mouth every 8 (eight) hours as needed., Starting 05/09/2014, Until Discontinued, Print        2.  Patient Education/Counseling:  The patient was given appropriate handouts, self care instructions, and instructed in symptomatic relief.  Elevate legs, stay off feet, apply ice.  3.  Follow up:  The patient was told to follow up here if no better in 3 to 4 days, or sooner if becoming worse in any way, and given some red flag symptoms such as worsening swelling, chest pain, shortness of breath or fever which would prompt immediate return.       Harden Mo, MD 05/09/14 [phone]

## 2014-05-09 NOTE — ED Notes (Signed)
Pt  Reports  Pain  r  Calf       X  2  Days     denys  A  specefic  Injury     No  Recent  Air  Travel       No  Oral  Contraceptives         History  Of  Blood  Clots   In leg  Many  Years  Ago   -  denys  Any  Chest pain or  Any  Shortness  Of  Breath      Sitting upright on  Exam table  Speaking in  Complete  sentances  And  Is  In no  Acute  Distress

## 2014-05-09 NOTE — Discharge Instructions (Signed)
Baker Cyst °A Baker cyst is a sac-like structure that forms in the back of the knee. It is filled with the same fluid that is located in your knee. This fluid lubricates the bones and cartilage of the knee and allows them to move over each other more easily. °CAUSES  °When the knee becomes injured or inflamed, increased fluid forms in the knee. When this happens, the joint lining is pushed out behind the knee and forms the Baker cyst. This cyst may also be caused by inflammation from arthritic conditions and infections. °SIGNS AND SYMPTOMS  °A Baker cyst usually has no symptoms. When the cyst is substantially enlarged: °· You may feel pressure behind the knee, stiffness in the knee, or a mass in the area behind the knee. °· You may develop pain, redness, and swelling in the calf.  This can suggest a blood clot and requires evaluation by your health care provider. °DIAGNOSIS  °A Baker cyst is most often found during an ultrasound exam. This exam may have been performed for other reasons, and the cyst was found incidentally. Sometimes an MRI is used. This picks up other problems within a joint that an ultrasound exam may not. If the Baker cyst developed immediately after an injury, X-ray exams may be used to diagnose the cyst. °TREATMENT  °The treatment depends on the cause of the cyst. Anti-inflammatory medicines and rest often will be prescribed. If the cyst is caused by a bacterial infection, antibiotic medicines may be prescribed.  °HOME CARE INSTRUCTIONS  °· If the cyst was caused by an injury, for the first 24 hours, keep the injured leg elevated on 2 pillows while lying down. °· For the first 24 hours while you are awake, apply ice to the injured area: °¨ Put ice in a plastic bag. °¨ Place a towel between your skin and the bag. °¨ Leave the ice on for 20 minutes, 2-3 times a day. °· Only take over-the-counter or prescription medicines for pain, discomfort, or fever as directed by your health care  provider. °· Only take antibiotic medicine as directed. Make sure to finish it even if you start to feel better. °MAKE SURE YOU:  °· Understand these instructions. °· Will watch your condition. °· Will get help right away if you are not doing well or get worse. °Document Released: 04/28/2005 Document Revised: 02/16/2013 Document Reviewed: 12/08/2012 °[employer]® Patient Information ©2015 [employer]. This information is not intended to replace advice given to you by your health care provider. Make sure you discuss any questions you have with your health care provider. ° °

## 2014-05-18 ENCOUNTER — Encounter: Payer: Self-pay | Admitting: Sports Medicine

## 2014-05-18 ENCOUNTER — Ambulatory Visit (INDEPENDENT_AMBULATORY_CARE_PROVIDER_SITE_OTHER): Payer: Self-pay | Admitting: Sports Medicine

## 2014-05-18 VITALS — BP 153/85 | Ht 64.0 in | Wt 200.0 lb

## 2014-05-18 DIAGNOSIS — M25461 Effusion, right knee: Secondary | ICD-10-CM

## 2014-05-18 DIAGNOSIS — M1711 Unilateral primary osteoarthritis, right knee: Secondary | ICD-10-CM

## 2014-05-18 DIAGNOSIS — M25561 Pain in right knee: Secondary | ICD-10-CM

## 2014-05-18 MED ORDER — METHYLPREDNISOLONE ACETATE 40 MG/ML IJ SUSP
40.0000 mg | Freq: Once | INTRAMUSCULAR | Status: AC
  Administered 2014-05-18: 40 mg via INTRA_ARTICULAR

## 2014-05-18 NOTE — Progress Notes (Signed)
[name] - 54 y.o. female MRN [id number]  Date of birth: [birth year] New evaluation for: CC: Right Knee Pain and swelling Patient reports acute onset of pain and swelling approximately 2 weeks ago. She was seen in the urgent care and diagnosed with a Baker's cyst after d-dimer was negative. She denies any significant injury no prodromal symptoms. She awoke with the pain and swelling after a normal day. She has had progressive difficulty with walking and bending and straightening her knee. She does have medial sided pain with occasional mechanical symptoms.  ROS:  Per HPI.   HISTORY: Past Medical, Surgical, Social, and Family History Reviewed & Updated per EMR.  Pertinent Historical Findings include: Bilateral leg pain, prior left total knee arthroplasty, lumbar spondylosis Hypertension, GERD, currently followed at [facility], Medications reviewed pertinent for meloxicam, occasional Ultram  Historical Data Reviewed: No x-rays available   OBJECTIVE:  VS:   HT:5\' 4"  (162.6 cm)   WT:200 lb (90.719 kg)  BMI:34.4          BP:(!) 153/85 mmHg  HR: bpm  TEMP: ( )  RESP:   PHYSICAL EXAM: GENERAL:  adult Caucasian female. In no discomfort; no respiratory distress   PSYCH: alert and appropriate, good insight   NEURO: sensation is intact to light touch in bilateral lower extremities   VASCULAR:  DP pulses 2+/4.  No significant edema.    Right knee Exam: Appearance:  osteoarthritic bossing and generalized swelling in the knee and lower leg.  Moderate effusion on palpation, palpable Baker's cyst   Skin: No overlying erythema/ecchymosis.  Palpation: TTP over: Medial joint line No TTP over: Lateral joint line, patellar tendon   Strength, ROM & OtherTests:  loss of terminal extension by 2-3 Extensor mechanism intact Right knee is stable to varus and valgus strain, she does have mild pain with valgus strain. Stable to anterior posterior drawer Positive McMurray's    Left  Knee with well-healed midline incision, no significant effusion, 2-3 mm of opening with varus strain    Limited MSK Ultrasound of right knee: Findings:  significant effusion. Significant spurring along the medial joint line. Mushroom sign of medial meniscus   Impression: The above findings are consistent with large joint effusion due to medial compartment OA and degenerative medial meniscal tear     ASSESSMENT: 1. Right knee pain    Arthritic and degenerative meniscal findings on ultrasound contribute to effusion with secondary Baker's cyst. She is status post knee arthroscopy on the affected side and status post left total knee arthroplasty.  PROCEDURE NOTE : Ultrasound Guided Right Knee Aspiration and Injection The risks, benefits and expected outcomes of the injection were reviewed and she wishes to undergo the above named procedure.  Written consent was obtained. After an appropriate time out was taken, the ultrasound was used to identify the target structure and adjacent vascular structures. The Right knee was then prepped in a sterile fashion using alcohol and a skin wheal was obtained using 3cc of 1% lidocaine on a 27 needle.  The area was cleaned again with alcohol and the US probe was prepped in sterile fashion with sterile ultrasound jelly.  Under direct visualization with real time Ultrasound guidance the target structure was injected as below:             Needle:  18g 1.5 inc          Aspirate:  20cc straw-colored clear fluid  Meds:  1 mL of 40 mg Depo-Medrol, 3 mL of 1% lidocaine             Images: Obtained and saved A bandaid was applied to the area. This procedure was well tolerated and there were no complications.    PLAN: See problem based charting & AVS for additional documentation.  Injection as above  Recommended compression, icing  If no significant improvement may need evaluation at [facility] through the [facility] program. > Return for As  scheduled for bilateral lower extremity pain.

## 2014-05-24 ENCOUNTER — Other Ambulatory Visit: Payer: Self-pay

## 2014-05-24 ENCOUNTER — Encounter: Payer: Self-pay | Admitting: Gastroenterology

## 2014-05-24 ENCOUNTER — Ambulatory Visit (INDEPENDENT_AMBULATORY_CARE_PROVIDER_SITE_OTHER): Payer: No Typology Code available for payment source | Admitting: Gastroenterology

## 2014-05-24 VITALS — BP 148/93 | HR 93 | Temp 97.4°F | Ht 64.0 in | Wt 210.6 lb

## 2014-05-24 DIAGNOSIS — K219 Gastro-esophageal reflux disease without esophagitis: Secondary | ICD-10-CM

## 2014-05-24 DIAGNOSIS — R1314 Dysphagia, pharyngoesophageal phase: Secondary | ICD-10-CM

## 2014-05-24 DIAGNOSIS — Z1211 Encounter for screening for malignant neoplasm of colon: Secondary | ICD-10-CM

## 2014-05-24 MED ORDER — DEXLANSOPRAZOLE 60 MG PO CPDR: 60.0000 mg | DELAYED_RELEASE_CAPSULE | Freq: Every day | ORAL | Status: DC

## 2014-05-24 MED ORDER — PEG-KCL-NACL-NASULF-NA ASC-C 100 G PO SOLR: 1.0000 | ORAL | Status: DC

## 2014-05-24 NOTE — Progress Notes (Signed)
Referring Provider: Lorayne Marek, MD Primary Care Physician:  Lorayne Marek, MD Primary Gastroenterologist:  Dr. Gala Romney   Chief Complaint  Patient presents with  . Gastrophageal Reflux  . Colonoscopy    HPI:   [name] is a 54 y.o. female presenting today at the request of Advani, Deepak, MD secondary to need for screening colonoscopy and possible EGD.    Last November 2014 placed on Prilosec 40 mg BID with improvement. Zantac in the evening. Did well for a year and then felt a "knot". Reflux worsening, chokes. Will drink something and starts choking. Will sometimes feel like food gets lodged in mid chest, takes her breath away. No odynophagia. Carafate QID. No nausea. Only vomiting with severe choking. No weight loss or lack of appetite. Takes Ibuprofen for severe leg pain. Mobic 15 mg daily.   NO lower GI symptoms. No rectal bleeding or melena.   Past Medical History  Diagnosis Date  . Hypertension   . Hyperlipidemia   . Gallstones   . Acid reflux   . Anxiety and depression     Past Surgical History  Procedure Laterality Date  . Total knee arthroplasty  2007    left  . Ganglion cyst excision      Current Outpatient Prescriptions  Medication Sig Dispense Refill  . amitriptyline (ELAVIL) 50 MG tablet Take 1 tablet (50 mg total) by mouth at bedtime. 30 tablet 2  . atorvastatin (LIPITOR) 10 MG tablet TAKE 1 TABLET BY MOUTH DAILY AT 6 PM. 30 tablet 5  . ibuprofen (ADVIL,MOTRIN) 800 MG tablet Take 800 mg by mouth.    Marland Kitchen lisinopril (PRINIVIL,ZESTRIL) 40 MG tablet Take 40 mg by mouth.    . meloxicam (MOBIC) 15 MG tablet Take 1 tablet (15 mg total) by mouth daily. 15 tablet 0  . omeprazole (PRILOSEC) 40 MG capsule TAKE 1 CAPSULE BY MOUTH TWICE DAILY 60 capsule 5  . ranitidine (ZANTAC) 150 MG tablet Take 1 tablet (150 mg total) by mouth at bedtime. 30 tablet 3  . sucralfate (CARAFATE) 1 G tablet Take 1 g by mouth.    . traZODone (DESYREL) 50 MG tablet TAKE 1/2 TO 1  TABLETS BY MOUTH AT BEDTIME AS NEEDED FOR SLEEP. 30 tablet 3  . dexlansoprazole (DEXILANT) 60 MG capsule Take 1 capsule (60 mg total) by mouth daily. 90 capsule 3  . peg 3350 powder (MOVIPREP) 100 G SOLR Take 1 kit (200 g total) by mouth as directed. 1 kit 0  . [DISCONTINUED] fluticasone (FLONASE) 50 MCG/ACT nasal spray Place 1 spray into the nose 2 (two) times daily. 1 g 2  . [DISCONTINUED] furosemide (LASIX) 20 MG tablet Take 3 tablets (60 mg total) by mouth daily. 240 tablet 3  . [DISCONTINUED] gabapentin (NEURONTIN) 100 MG capsule 1 cap po qhsX3 days then up to 1 cap po tid prn. 30 capsule 1   No current facility-administered medications for this visit.    Allergies as of 05/24/2014  . (No Known Allergies)    Family History  Problem Relation Age of Onset  . Cancer Father     lung  . Breast cancer Sister   . Cancer Maternal Grandfather     lung  . Cancer Paternal Grandfather     lung  . Colon cancer Neg Hx     History   Social History  . Marital Status: Divorced    Spouse Name: N/A    Number of Children: N/A  . Years of  Education: N/A   Occupational History  . Not on file.   Social History Main Topics  . Smoking status: Never Smoker   . Smokeless tobacco: Never Used  . Alcohol Use: No  . Drug Use: No  . Sexual Activity: Not Currently    Birth Control/ Protection: None   Other Topics Concern  . Not on file   Social History Narrative    Review of Systems: As mentioned in HPI.   Physical Exam: BP 148/93 mmHg  Pulse 93  Temp(Src) 97.4 F (36.3 C) (Oral)  Ht _0  (1.626 m)  Wt 210 lb 9.6 oz (95.528 kg)  BMI 36.13 kg/m2  LMP 02/07/2013 General:   Alert and oriented. Well-developed, well-nourished, pleasant and cooperative. Head:  Normocephalic and atraumatic. Eyes:  Conjunctiva pink, sclera clear, no icterus.   Conjunctiva pink. Ears:  Normal auditory acuity. Nose:  No deformity, discharge,  or lesions. Mouth:  No deformity or lesions, mucosa pink and  moist.  Lungs:  Clear to auscultation bilaterally, without wheezing, rales, or rhonchi.  Heart:  S1, S2 present without murmurs noted.  Abdomen:  +BS, soft, non-tender and non-distended. Without mass or HSM. No rebound or guarding. No hernias noted. Rectal:  Deferred  Msk:  Symmetrical without gross deformities. Normal posture. Extremities:  Without clubbing or edema. Neurologic:  Alert and  oriented x4;  grossly normal neurologically. Skin:  Intact, warm and dry without significant lesions or rashes Psych:  Alert and cooperative. Normal mood and affect.

## 2014-05-24 NOTE — Patient Instructions (Signed)
I have given you samples of Dexilant to start taking once each morning. I have provided the prescription in case you would like to have this filled. IF it is too expensive, let us know.   We have scheduled you for a colonoscopy, upper endoscopy, and dilation in the near future with Dr. Gala Romney.

## 2014-05-26 DIAGNOSIS — Z1211 Encounter for screening for malignant neoplasm of colon: Secondary | ICD-10-CM | POA: Insufficient documentation

## 2014-05-26 DIAGNOSIS — R1314 Dysphagia, pharyngoesophageal phase: Secondary | ICD-10-CM | POA: Insufficient documentation

## 2014-05-26 DIAGNOSIS — R1310 Dysphagia, unspecified: Secondary | ICD-10-CM | POA: Insufficient documentation

## 2014-05-26 NOTE — Assessment & Plan Note (Signed)
Worsening GERD symptoms despite Prilosec BID and Zantac each evening, now with dysphagia, choking. Ibuprofen occasionally along with Mobic 15 mg daily. Needs EGD to assess for esophagitis, web, ring, or stricture. Change to Dexilant samples for now, stop Prilosec.   Proceed with upper endoscopy and dilation  in the near future with Dr. Gala Romney. The risks, benefits, and alternatives have been discussed in detail with patient. They have stated understanding and desire to proceed.

## 2014-05-26 NOTE — Assessment & Plan Note (Signed)
54 year old female with need for initial screening colonoscopy; she has no lower GI symptoms of concern or family history of colon cancer.   Proceed with TCS with Dr. Gala Romney in near future: the risks, benefits, and alternatives have been discussed with the patient in detail. The patient states understanding and desires to proceed.

## 2014-05-26 NOTE — Assessment & Plan Note (Signed)
Query esophagitis, web, ring, stricture. Dilation as appropriate.

## 2014-05-29 ENCOUNTER — Encounter (HOSPITAL_COMMUNITY): Payer: Self-pay | Admitting: *Deleted

## 2014-05-29 ENCOUNTER — Encounter (HOSPITAL_COMMUNITY)
Admission: RE | Disposition: A | Discharge status: Home Health | Payer: Self-pay | Source: Ambulatory Visit | Attending: Internal Medicine

## 2014-05-29 ENCOUNTER — Ambulatory Visit (HOSPITAL_COMMUNITY)
Admission: RE | Admit: 2014-05-29 | Discharge: 2014-05-29 | Disposition: A | Discharge status: Home Health | Payer: Self-pay | Source: Ambulatory Visit | Attending: Internal Medicine | Admitting: Internal Medicine

## 2014-05-29 DIAGNOSIS — Z1211 Encounter for screening for malignant neoplasm of colon: Secondary | ICD-10-CM | POA: Insufficient documentation

## 2014-05-29 DIAGNOSIS — K222 Esophageal obstruction: Secondary | ICD-10-CM | POA: Insufficient documentation

## 2014-05-29 DIAGNOSIS — K210 Gastro-esophageal reflux disease with esophagitis: Secondary | ICD-10-CM | POA: Insufficient documentation

## 2014-05-29 DIAGNOSIS — Q394 Esophageal web: Secondary | ICD-10-CM

## 2014-05-29 DIAGNOSIS — E785 Hyperlipidemia, unspecified: Secondary | ICD-10-CM | POA: Insufficient documentation

## 2014-05-29 DIAGNOSIS — K3189 Other diseases of stomach and duodenum: Secondary | ICD-10-CM | POA: Insufficient documentation

## 2014-05-29 DIAGNOSIS — R1314 Dysphagia, pharyngoesophageal phase: Secondary | ICD-10-CM

## 2014-05-29 DIAGNOSIS — K449 Diaphragmatic hernia without obstruction or gangrene: Secondary | ICD-10-CM | POA: Insufficient documentation

## 2014-05-29 DIAGNOSIS — K219 Gastro-esophageal reflux disease without esophagitis: Secondary | ICD-10-CM

## 2014-05-29 DIAGNOSIS — R1310 Dysphagia, unspecified: Secondary | ICD-10-CM | POA: Insufficient documentation

## 2014-05-29 DIAGNOSIS — F418 Other specified anxiety disorders: Secondary | ICD-10-CM | POA: Insufficient documentation

## 2014-05-29 DIAGNOSIS — I10 Essential (primary) hypertension: Secondary | ICD-10-CM | POA: Insufficient documentation

## 2014-05-29 HISTORY — PX: COLONOSCOPY: SHX5424

## 2014-05-29 HISTORY — PX: MALONEY DILATION: SHX5535

## 2014-05-29 HISTORY — PX: ESOPHAGOGASTRODUODENOSCOPY: SHX5428

## 2014-05-29 SURGERY — COLONOSCOPY
Anesthesia: Moderate Sedation

## 2014-05-29 MED ORDER — LIDOCAINE VISCOUS 2 % MT SOLN
OROMUCOSAL | Status: AC
  Filled 2014-05-29: qty 15

## 2014-05-29 MED ORDER — LIDOCAINE VISCOUS 2 % MT SOLN
OROMUCOSAL | Status: DC | PRN
  Administered 2014-05-29: 3 mL via OROMUCOSAL

## 2014-05-29 MED ORDER — ONDANSETRON HCL 4 MG/2ML IJ SOLN
INTRAMUSCULAR | Status: DC | PRN
  Administered 2014-05-29: 4 mg via INTRAVENOUS

## 2014-05-29 MED ORDER — MIDAZOLAM HCL 5 MG/5ML IJ SOLN
INTRAMUSCULAR | Status: DC | PRN
  Administered 2014-05-29: 2 mg via INTRAVENOUS
  Administered 2014-05-29 (×4): 1 mg via INTRAVENOUS
  Administered 2014-05-29 (×2): 2 mg via INTRAVENOUS
  Administered 2014-05-29: 1 mg via INTRAVENOUS

## 2014-05-29 MED ORDER — MEPERIDINE HCL 100 MG/ML IJ SOLN
INTRAMUSCULAR | Status: AC
  Filled 2014-05-29: qty 2

## 2014-05-29 MED ORDER — MEPERIDINE HCL 100 MG/ML IJ SOLN
INTRAMUSCULAR | Status: DC | PRN
  Administered 2014-05-29 (×2): 25 mg via INTRAVENOUS
  Administered 2014-05-29: 50 mg via INTRAVENOUS
  Administered 2014-05-29 (×2): 25 mg via INTRAVENOUS
  Administered 2014-05-29: 50 mg via INTRAVENOUS

## 2014-05-29 MED ORDER — ONDANSETRON HCL 4 MG/2ML IJ SOLN
INTRAMUSCULAR | Status: AC
  Filled 2014-05-29: qty 2

## 2014-05-29 MED ORDER — SODIUM CHLORIDE 0.9 % IV SOLN
INTRAVENOUS | Status: DC
Start: 2014-05-29 — End: 2014-05-29
  Administered 2014-05-29: 13:00:00 via INTRAVENOUS

## 2014-05-29 MED ORDER — MIDAZOLAM HCL 5 MG/5ML IJ SOLN
INTRAMUSCULAR | Status: AC
  Filled 2014-05-29: qty 5

## 2014-05-29 MED ORDER — MIDAZOLAM HCL 5 MG/5ML IJ SOLN
INTRAMUSCULAR | Status: AC
  Filled 2014-05-29: qty 10

## 2014-05-29 MED ORDER — SIMETHICONE 40 MG/0.6ML PO SUSP
ORAL | Status: DC | PRN
  Administered 2014-05-29: 13:00:00

## 2014-05-29 NOTE — Op Note (Signed)
[facility], [place] [address] [place], [address]   ENDOSCOPY PROCEDURE REPORT  PATIENT: [name], [name]  MR#: [id number] BIRTHDATE: Jun 21, 1960 , 61  yrs. old GENDER: female ENDOSCOPIST: R.  Garfield Cornea, MD FACP [facility] REFERRED BY:  Lorayne Marek, MD PROCEDURE DATE:  2014/06/08 PROCEDURE:  EGD, diagnostic and Maloney dilation of esophagus INDICATIONS:  Esophageal dysphagia; GERD. MEDICATIONS: Versed 8 mg IV and Demerol 150 mg IV in divided doses. Xylocaine gel orally.  Zofran 4 mg IV. ASA CLASS:      Class II  CONSENT: The risks, benefits, limitations, alternatives and imponderables have been discussed.  The potential for biopsy, esophogeal dilation, etc. have also been reviewed.  Questions have been answered.  All parties agreeable.  Please see the history and physical in the medical record for more information.  DESCRIPTION OF PROCEDURE: After the risks benefits and alternatives of the procedure were thoroughly explained, informed consent was obtained.  The EG-[id number] ([id number]) endoscope was introduced through the mouth and advanced to the second portion of the duodenum , limited by Without limitations. The instrument was slowly withdrawn as the mucosa was fully examined.    Schatzki's ring with the overlying erosions at the level of the squamocolumnar junction.  No Barrett's esophagus.  Tubular esophagus easily traversed with the diagnostic gastroscope. Stomach empty.  5 cm hiatal hernia present.  Couple of tiny linear antral erosions otherwise no ulcer or infiltrating process.  Patent pylorus.  Normal first and second portion of the duodenum.  Scope was removed.  A 54 French Maloney dilators passed for insertion with mild resistance.  A look back revealed the ring remained intact.  Subsequently, I passed a 33 French Maloney dilator to full insertion with mild resistance.  I again looked back and the ring again remained intact.  I did not feel her hypopharynx  would accommodate a larger bore dilator.  Subsequently, I obtaining the jumbo biopsy forceps and performed four-quadrant "bites of the ring".  This was done effectively and without apparent complication.  The ring was disrupted fairly well with this maneuver.  Retroflexed views revealed a hiatal hernia.     The scope was then withdrawn from the patient and the procedure completed.  COMPLICATIONS: There were no immediate complications.  ENDOSCOPIC IMPRESSION: Schatzki's ring?"status post dilation and disruption as described above. Erosive reflux esophagitis. 5 cm hiatal hernia. Trivial antral erosions.  RECOMMENDATIONS: Start Dexilant 60 mg daily as previously recommended (has not yet gone on this medication). See colonoscopy report.  REPEAT EXAM:  eSigned:  R. Garfield Cornea, MD Rosalita Chessman [facility] 2014-06-08 1:57 PM    CC:  CPT CODES: ICD CODES:  The ICD and CPT codes recommended by this software are interpretations from the data that the clinical staff has captured with the software.  The verification of the translation of this report to the ICD and CPT codes and modifiers is the sole responsibility of the health care institution and practicing physician where this report was generated.  [employer] [facility] will not be held responsible for the validity of the ICD and CPT codes included on this report.  AMA assumes no liability for data contained or not contained herein. CPT is a Designer, television/film set of the [employer].  PATIENT NAME:  [name], [name] MR#: [id number]

## 2014-05-29 NOTE — Interval H&P Note (Signed)
History and Physical Interval Note:  05/29/2014 1:11 PM  [name]  has presented today for surgery, with the diagnosis of GERD, Dysphagia, screening colonoscopy  The various methods of treatment have been discussed with the patient and family. After consideration of risks, benefits and other options for treatment, the patient has consented to  Procedure(s) with comments: COLONOSCOPY (N/A) - 215pm- Pt is working until 12:00 so she can't come any earlier ESOPHAGOGASTRODUODENOSCOPY (EGD) (N/A) SAVORY DILATION (N/A) MALONEY DILATION (N/A) as a surgical intervention .  The patient's history has been reviewed, patient examined, no change in status, stable for surgery.  I have reviewed the patient's chart and labs.  Questions were answered to the patient's satisfaction.     Mahari Strahm  No change. EGD with possible Salter dilation and colonoscopy per plan. The risks, benefits, limitations, imponderables and alternatives regarding both EGD and colonoscopy have been reviewed with the patient. Questions have been answered. All parties agreeable.

## 2014-05-29 NOTE — H&P (View-Only) (Signed)
Referring Provider: Lorayne Marek, MD Primary Care Physician:  Lorayne Marek, MD Primary Gastroenterologist:  Dr. Gala Romney   Chief Complaint  Patient presents with  . Gastrophageal Reflux  . Colonoscopy    HPI:   [name] is a 54 y.o. female presenting today at the request of Advani, Deepak, MD secondary to need for screening colonoscopy and possible EGD.    Last November 2014 placed on Prilosec 40 mg BID with improvement. Zantac in the evening. Did well for a year and then felt a "knot". Reflux worsening, chokes. Will drink something and starts choking. Will sometimes feel like food gets lodged in mid chest, takes her breath away. No odynophagia. Carafate QID. No nausea. Only vomiting with severe choking. No weight loss or lack of appetite. Takes Ibuprofen for severe leg pain. Mobic 15 mg daily.   NO lower GI symptoms. No rectal bleeding or melena.   Past Medical History  Diagnosis Date  . Hypertension   . Hyperlipidemia   . Gallstones   . Acid reflux   . Anxiety and depression     Past Surgical History  Procedure Laterality Date  . Total knee arthroplasty  2007    left  . Ganglion cyst excision      Current Outpatient Prescriptions  Medication Sig Dispense Refill  . amitriptyline (ELAVIL) 50 MG tablet Take 1 tablet (50 mg total) by mouth at bedtime. 30 tablet 2  . atorvastatin (LIPITOR) 10 MG tablet TAKE 1 TABLET BY MOUTH DAILY AT 6 PM. 30 tablet 5  . ibuprofen (ADVIL,MOTRIN) 800 MG tablet Take 800 mg by mouth.    Marland Kitchen lisinopril (PRINIVIL,ZESTRIL) 40 MG tablet Take 40 mg by mouth.    . meloxicam (MOBIC) 15 MG tablet Take 1 tablet (15 mg total) by mouth daily. 15 tablet 0  . omeprazole (PRILOSEC) 40 MG capsule TAKE 1 CAPSULE BY MOUTH TWICE DAILY 60 capsule 5  . ranitidine (ZANTAC) 150 MG tablet Take 1 tablet (150 mg total) by mouth at bedtime. 30 tablet 3  . sucralfate (CARAFATE) 1 G tablet Take 1 g by mouth.    . traZODone (DESYREL) 50 MG tablet TAKE 1/2 TO 1  TABLETS BY MOUTH AT BEDTIME AS NEEDED FOR SLEEP. 30 tablet 3  . dexlansoprazole (DEXILANT) 60 MG capsule Take 1 capsule (60 mg total) by mouth daily. 90 capsule 3  . peg 3350 powder (MOVIPREP) 100 G SOLR Take 1 kit (200 g total) by mouth as directed. 1 kit 0  . [DISCONTINUED] fluticasone (FLONASE) 50 MCG/ACT nasal spray Place 1 spray into the nose 2 (two) times daily. 1 g 2  . [DISCONTINUED] furosemide (LASIX) 20 MG tablet Take 3 tablets (60 mg total) by mouth daily. 240 tablet 3  . [DISCONTINUED] gabapentin (NEURONTIN) 100 MG capsule 1 cap po qhsX3 days then up to 1 cap po tid prn. 30 capsule 1   No current facility-administered medications for this visit.    Allergies as of 05/24/2014  . (No Known Allergies)    Family History  Problem Relation Age of Onset  . Cancer Father     lung  . Breast cancer Sister   . Cancer Maternal Grandfather     lung  . Cancer Paternal Grandfather     lung  . Colon cancer Neg Hx     History   Social History  . Marital Status: Divorced    Spouse Name: N/A    Number of Children: N/A  . Years of  Education: N/A   Occupational History  . Not on file.   Social History Main Topics  . Smoking status: Never Smoker   . Smokeless tobacco: Never Used  . Alcohol Use: No  . Drug Use: No  . Sexual Activity: Not Currently    Birth Control/ Protection: None   Other Topics Concern  . Not on file   Social History Narrative    Review of Systems: As mentioned in HPI.   Physical Exam: BP 148/93 mmHg  Pulse 93  Temp(Src) 97.4 F (36.3 C) (Oral)  Ht _0  (1.626 m)  Wt 210 lb 9.6 oz (95.528 kg)  BMI 36.13 kg/m2  LMP 02/07/2013 General:   Alert and oriented. Well-developed, well-nourished, pleasant and cooperative. Head:  Normocephalic and atraumatic. Eyes:  Conjunctiva pink, sclera clear, no icterus.   Conjunctiva pink. Ears:  Normal auditory acuity. Nose:  No deformity, discharge,  or lesions. Mouth:  No deformity or lesions, mucosa pink and  moist.  Lungs:  Clear to auscultation bilaterally, without wheezing, rales, or rhonchi.  Heart:  S1, S2 present without murmurs noted.  Abdomen:  +BS, soft, non-tender and non-distended. Without mass or HSM. No rebound or guarding. No hernias noted. Rectal:  Deferred  Msk:  Symmetrical without gross deformities. Normal posture. Extremities:  Without clubbing or edema. Neurologic:  Alert and  oriented x4;  grossly normal neurologically. Skin:  Intact, warm and dry without significant lesions or rashes Psych:  Alert and cooperative. Normal mood and affect.

## 2014-05-29 NOTE — Interval H&P Note (Signed)
History and Physical Interval Note:  05/29/2014 1:12 PM  [name]  has presented today for surgery, with the diagnosis of GERD, Dysphagia, screening colonoscopy  The various methods of treatment have been discussed with the patient and family. After consideration of risks, benefits and other options for treatment, the patient has consented to  Procedure(s) with comments: COLONOSCOPY (N/A) - 215pm- Pt is working until 12:00 so she can't come any earlier ESOPHAGOGASTRODUODENOSCOPY (EGD) (N/A) SAVORY DILATION (N/A) MALONEY DILATION (N/A) as a surgical intervention .  The patient's history has been reviewed, patient examined, no change in status, stable for surgery.  I have reviewed the patient's chart and labs.  Questions were answered to the patient's satisfaction.     [name]  No change. EGD with possible esophageal dilation and colonoscopy per plan.The risks, benefits, limitations, imponderables and alternatives regarding both EGD and colonoscopy have been reviewed with the patient. Questions have been answered. All parties agreeable.

## 2014-05-29 NOTE — Discharge Instructions (Addendum)
Colonoscopy Discharge Instructions  Read the instructions outlined below and refer to this sheet in the next few weeks. These discharge instructions provide you with general information on caring for yourself after you leave the hospital. Your doctor may also give you specific instructions. While your treatment has been planned according to the most current medical practices available, unavoidable complications occasionally occur. If you have any problems or questions after discharge, call Dr. Gala Romney at [phone]. ACTIVITY  You may resume your regular activity, but move at a slower pace for the next 24 hours.   Take frequent rest periods for the next 24 hours.   Walking will help get rid of the air and reduce the bloated feeling in your belly (abdomen).   No driving for 24 hours (because of the medicine (anesthesia) used during the test).    Do not sign any important legal documents or operate any machinery for 24 hours (because of the anesthesia used during the test).  NUTRITION  Drink plenty of fluids.   You may resume your normal diet as instructed by your doctor.   Begin with a light meal and progress to your normal diet. Heavy or fried foods are harder to digest and may make you feel sick to your stomach (nauseated).   Avoid alcoholic beverages for 24 hours or as instructed.  MEDICATIONS  You may resume your normal medications unless your doctor tells you otherwise.  WHAT YOU CAN EXPECT TODAY  Some feelings of bloating in the abdomen.   Passage of more gas than usual.   Spotting of blood in your stool or on the toilet paper.  IF YOU HAD POLYPS REMOVED DURING THE COLONOSCOPY:  No aspirin products for 7 days or as instructed.   No alcohol for 7 days or as instructed.   Eat a soft diet for the next 24 hours.  FINDING OUT THE RESULTS OF YOUR TEST Not all test results are available during your visit. If your test results are not back during the visit, make an appointment  with your caregiver to find out the results. Do not assume everything is normal if you have not heard from your caregiver or the medical facility. It is important for you to follow up on all of your test results.  SEEK IMMEDIATE MEDICAL ATTENTION IF:  You have more than a spotting of blood in your stool.   Your belly is swollen (abdominal distention).   You are nauseated or vomiting.   You have a temperature over 101.  You have abdominal pain or discomfort that is severe or gets worse throughout the day. EGD Discharge instructions Please read the instructions outlined below and refer to this sheet in the next few weeks. These discharge instructions provide you with general information on caring for yourself after you leave the hospital. Your doctor may also give you specific instructions. While your treatment has been planned according to the most current medical practices available, unavoidable complications occasionally occur. If you have any problems or questions after discharge, please call your doctor. ACTIVITY You may resume your regular activity but move at a slower pace for the next 24 hours.  Take frequent rest periods for the next 24 hours.  Walking will help expel (get rid of) the air and reduce the bloated feeling in your abdomen.  No driving for 24 hours (because of the anesthesia (medicine) used during the test).  You may shower.  Do not sign any important legal documents or operate any machinery for 24  hours (because of the anesthesia used during the test).  NUTRITION Drink plenty of fluids.  You may resume your normal diet.  Begin with a light meal and progress to your normal diet.  Avoid alcoholic beverages for 24 hours or as instructed by your caregiver.  MEDICATIONS You may resume your normal medications unless your caregiver tells you otherwise.  WHAT YOU CAN EXPECT TODAY You may experience abdominal discomfort such as a feeling of fullness or gas pains.   FOLLOW-UP Your doctor will discuss the results of your test with you.  SEEK IMMEDIATE MEDICAL ATTENTION IF ANY OF THE FOLLOWING OCCUR: Excessive nausea (feeling sick to your stomach) and/or vomiting.  Severe abdominal pain and distention (swelling).  Trouble swallowing.  Temperature over 101 F (37.8 C).  Rectal bleeding or vomiting of blood.     Begin Dexilant 60 mg daily as previously recommended  GERD information provided  Your colonoscopy prep was poor. Consequently, I recommend you return in one year for screening colonoscopy.  You had a Schatzki's ring (a narrowing in your esophagus) as well  As a hiatal hernia. I dilated your esophagus so you will be able to swallow better.  Office visit with [place] in 3 months.  April 18 at 1:30 with Elmer Picker  Gastroesophageal Reflux Disease, Adult Gastroesophageal reflux disease (GERD) happens when acid from your stomach flows up into the esophagus. When acid comes in contact with the esophagus, the acid causes soreness (inflammation) in the esophagus. Over time, GERD may create small holes (ulcers) in the lining of the esophagus. CAUSES   Increased body weight. This puts pressure on the stomach, making acid rise from the stomach into the esophagus.  Smoking. This increases acid production in the stomach.  Drinking alcohol. This causes decreased pressure in the lower esophageal sphincter (valve or ring of muscle between the esophagus and stomach), allowing acid from the stomach into the esophagus.  Late evening meals and a full stomach. This increases pressure and acid production in the stomach.  A malformed lower esophageal sphincter. Sometimes, no cause is found. SYMPTOMS   Burning pain in the lower part of the mid-chest behind the breastbone and in the mid-stomach area. This may occur twice a week or more often.  Trouble swallowing.  Sore throat.  Dry cough.  Asthma-like symptoms including chest tightness, shortness of  breath, or wheezing. DIAGNOSIS  Your caregiver may be able to diagnose GERD based on your symptoms. In some cases, X-rays and other tests may be done to check for complications or to check the condition of your stomach and esophagus. TREATMENT  Your caregiver may recommend over-the-counter or prescription medicines to help decrease acid production. Ask your caregiver before starting or adding any new medicines.  HOME CARE INSTRUCTIONS   Change the factors that you can control. Ask your caregiver for guidance concerning weight loss, quitting smoking, and alcohol consumption.  Avoid foods and drinks that make your symptoms worse, such as:  Caffeine or alcoholic drinks.  Chocolate.  Peppermint or mint flavorings.  Garlic and onions.  Spicy foods.  Citrus fruits, such as oranges, lemons, or limes.  Tomato-based foods such as sauce, chili, salsa, and pizza.  Fried and fatty foods.  Avoid lying down for the 3 hours prior to your bedtime or prior to taking a nap.  Eat small, frequent meals instead of large meals.  Wear loose-fitting clothing. Do not wear anything tight around your waist that causes pressure on your stomach.  Raise the head of  your bed 6 to 8 inches with wood blocks to help you sleep. Extra pillows will not help.  Only take over-the-counter or prescription medicines for pain, discomfort, or fever as directed by your caregiver.  Do not take aspirin, ibuprofen, or other nonsteroidal anti-inflammatory drugs (NSAIDs). SEEK IMMEDIATE MEDICAL CARE IF:   You have pain in your arms, neck, jaw, teeth, or back.  Your pain increases or changes in intensity or duration.  You develop nausea, vomiting, or sweating (diaphoresis).  You develop shortness of breath, or you faint.  Your vomit is green, yellow, black, or looks like coffee grounds or blood.  Your stool is red, bloody, or black. These symptoms could be signs of other problems, such as heart disease, gastric  bleeding, or esophageal bleeding. MAKE SURE YOU:   Understand these instructions.  Will watch your condition.  Will get help right away if you are not doing well or get worse. Document Released: 02/05/2005 Document Revised: 07/21/2011 Document Reviewed: 11/15/2010 [facility] Patient Information 2015 [place], [place]. This information is not intended to replace advice given to you by your health care provider. Make sure you discuss any questions you have with your health care provider.

## 2014-05-29 NOTE — Op Note (Signed)
[facility] [address] [place], [address]   COLONOSCOPY PROCEDURE REPORT  PATIENT: [name], [name]  MR#: [id number] BIRTHDATE: 24-May-1960 , 67  yrs. old GENDER: female ENDOSCOPIST: R.  Garfield Cornea, MD [place] OT:LXBWIO Advani, MD PROCEDURE DATE:  06-12-2014 PROCEDURE:   Colonoscopy, screening INDICATIONS:First-ever average risk colorectal cancer screening examination. MEDICATIONS: Versed 11 mg IV and Demerol 200 mg IV in divided doses. Zofran 4 mg IV. ASA CLASS:       Class II  CONSENT: The risks, benefits, alternatives and imponderables including but not limited to bleeding, perforation as well as the possibility of a missed lesion have been reviewed.  The potential for biopsy, lesion removal, etc. have also been discussed. Questions have been answered.  All parties agreeable.  Please see the history and physical in the medical record for more information.  DESCRIPTION OF PROCEDURE:   After the risks benefits and alternatives of the procedure were thoroughly explained, informed consent was obtained.  The digital rectal exam      The EC-[id number] ([id number])  endoscope was introduced through the anus and advanced to the cecum, which was identified by both the appendix and ileocecal valve. No adverse events experienced.   The quality of the prep was poor, using MoviPrep  The instrument was then slowly withdrawn as the colon was fully examined.      COLON FINDINGS: Prep was inadequate.  Grossly normal rectum. Grossly normal colonic mucosa but all mucosal surfaces were not seen due to viscous colonic effluent containing quite a bit of vegetable matter which could not be done away with during the examination.     .  Withdrawal time=7 minutes 0 seconds.  The scope was withdrawn and the procedure completed. COMPLICATIONS: There were no immediate complications.  ENDOSCOPIC IMPRESSION: Grossly normal colonoscopy however inadequate preparation  precluded complete examination of the colon  RECOMMENDATIONS: I advised the patient to return in one year for screening colonoscopy in the setting of a better preparation. See EGD report.  eSigned:  R. Garfield Cornea, MD Rosalita Chessman [facility] 06-12-2014 2:20 PM   cc:  CPT CODES: ICD CODES:  The ICD and CPT codes recommended by this software are interpretations from the data that the clinical staff has captured with the software.  The verification of the translation of this report to the ICD and CPT codes and modifiers is the sole responsibility of the health care institution and practicing physician where this report was generated.  [employer] will not be held responsible for the validity of the ICD and CPT codes included on this report.  AMA assumes no liability for data contained or not contained herein. CPT is a Designer, television/film set of the [employer].

## 2014-05-30 ENCOUNTER — Encounter (HOSPITAL_COMMUNITY): Payer: Self-pay | Admitting: Internal Medicine

## 2014-05-31 NOTE — Progress Notes (Signed)
cc'ed to pcp °

## 2014-06-07 ENCOUNTER — Ambulatory Visit (INDEPENDENT_AMBULATORY_CARE_PROVIDER_SITE_OTHER): Payer: Self-pay | Admitting: Sports Medicine

## 2014-06-07 ENCOUNTER — Encounter: Payer: Self-pay | Admitting: Sports Medicine

## 2014-06-07 ENCOUNTER — Ambulatory Visit
Admission: RE | Admit: 2014-06-07 | Discharge: 2014-06-07 | Disposition: A | Discharge status: Home / Self Care | Payer: No Typology Code available for payment source | Source: Ambulatory Visit | Attending: Sports Medicine | Admitting: Sports Medicine

## 2014-06-07 ENCOUNTER — Other Ambulatory Visit: Payer: Self-pay | Admitting: Sports Medicine

## 2014-06-07 VITALS — BP 135/79 | HR 92 | Ht 64.0 in | Wt 210.0 lb

## 2014-06-07 DIAGNOSIS — M47895 Other spondylosis, thoracolumbar region: Secondary | ICD-10-CM

## 2014-06-07 DIAGNOSIS — M1711 Unilateral primary osteoarthritis, right knee: Secondary | ICD-10-CM

## 2014-06-07 DIAGNOSIS — M25561 Pain in right knee: Secondary | ICD-10-CM

## 2014-06-07 DIAGNOSIS — M25461 Effusion, right knee: Secondary | ICD-10-CM

## 2014-06-07 DIAGNOSIS — Z96652 Presence of left artificial knee joint: Secondary | ICD-10-CM

## 2014-06-07 MED ORDER — TRAMADOL HCL 50 MG PO TABS: 50.0000 mg | ORAL_TABLET | Freq: Four times a day (QID) | ORAL | Status: DC | PRN

## 2014-06-07 NOTE — Patient Instructions (Addendum)
Will call with results from MRI

## 2014-06-07 NOTE — Progress Notes (Signed)
[name] - 54 y.o. female MRN [id number]  Date of birth: January 16, 1961  SUBJECTIVE: CC:  Right knee pain follow-up  HPI: Status post knee aspiration and injection last visit. She reports overall her knee pain was significantly improved for 2 weeks but it has progressively worsened since that time. She reports persistent swelling and knee stiffness. She's had difficulty with walking over the past 1 week. Denies any recurrent trauma. Denies any new numbness or tingling she does have new onset of posterior leg and calf cramping bilaterally worse on the right than the left.  ROS: otherwise per HPI.   HISTORY:  Past Medical, Surgical, Social, and Family History reviewed & updated per EMR.  Pertinent Historical Findings include:  reports that she has never smoked. She has never used smokeless tobacco. Significant low back etiology with no significant evidence of radiculopathy.  OBJECTIVE:  VS:   HT:5\' 4"  (162.6 cm)   WT:210 lb (95.255 kg)  BMI:36.1          BP:135/79 mmHg  HR:92bpm  TEMP: ( )  RESP:   PHYSICAL EXAM:  Adult Caucasian female in no acute distress. She is alert and appropriately interactive. Bilateral lower extremities have no pretibial edema. Dorsalis pedis pulses trace.  Right Knee Exam: Appearance:  overall normal alignment, loss of contours  Skin: No overlying erythema/ecchymosis.  Palpation: moderate effusion with small baker's cyst Patellar Grind: Normal Medial Joint Line: TTP  Lateral Joint Line: TTP  Strength, ROM & OtherTests: Varus/Valgus Strain: stable Anterior/Posterior Drawer: stable Meniscal Testing: Abnormal- painful but no mechanical sx with McMurray's Strength: 5/5 quad extension ROM: 5-95    Ortho Exam  DATA OBTAINED DURING VISIT: Plain film x-rays obtained revealed moderate to mild osteoarthritis of the right knee. Soft tissue swelling. No acute fracture dislocation. MRI ordered  ASSESSMENT: 1. Right knee pain   2. Primary osteoarthritis of  right knee   3. Knee effusion, right   4. Status post total knee replacement using cement, left   5. Other osteoarthritis of spine, thoracolumbar region    Problem  Status Post Total Knee Replacement Using Cement  Primary Osteoarthritis of Right Knee   Mild arthritis of the right knee with associated Baker's cyst.  s/p aspiration and injection 05/18/2014 with recurrence approximately 10 days later   Adrenal Adenoma  Breast Lump  Other Osteoarthritis of Spine, Thoracolumbar Region   Significant L4-L5 and L5-S1 facet hypertrophy may be contributing to some of her lower extremity symptoms however  12/15/2013: MRI lumbar spine: Advanced L4-5 and L5-S1 facet osteoarthritis with bone edema and mild degenerative L4-L5 anterior listhesis. Mild lateral recess stenosis at L4-L5 without nerve compression  01/24/2014: ABIs of bilateral lower extremities   Right ant tibial 181 mm Hg1.17     Triphasic +-----------------+---------+--------------+---------+ Right post tibial171 mm Hg1.1      Biphasic  +-----------------+---------+--------------+---------+ Left ant tibial 184 mm Hg1.19     Triphasic +-----------------+---------+--------------+---------+ Left post tibial 177 mm Hg1.14     Triphasic      PLAN: See problem based charting & AVS for additional documentation.  MRI of right knee to evaluate for meniscal etiology and for evaluation of extent of osteoarthritis. She is status post left total knee arthroplasty suspect her underlying arthritis may be more than the x-ray reveals.  Rx Today: Tramadol when necessary to help with muscle spasms and sleep  HEP: Continue quad sets and straight leg raises. > Return for Will call with results and if indicated discuss referral to [facility]. I  will call with results to discuss further options but she may ultimately require surgical evaluation at [facility] for potential arthroscopic versus right  total knee depending on symptoms.

## 2014-06-07 NOTE — Progress Notes (Deleted)
error 

## 2014-06-08 ENCOUNTER — Ambulatory Visit
Admission: RE | Admit: 2014-06-08 | Discharge: 2014-06-08 | Disposition: A | Discharge status: Home / Self Care | Payer: No Typology Code available for payment source | Source: Ambulatory Visit | Attending: Sports Medicine | Admitting: Sports Medicine

## 2014-06-08 DIAGNOSIS — M1711 Unilateral primary osteoarthritis, right knee: Secondary | ICD-10-CM | POA: Insufficient documentation

## 2014-06-08 DIAGNOSIS — Z96659 Presence of unspecified artificial knee joint: Secondary | ICD-10-CM | POA: Insufficient documentation

## 2014-06-08 DIAGNOSIS — M25461 Effusion, right knee: Secondary | ICD-10-CM

## 2014-06-08 DIAGNOSIS — M25561 Pain in right knee: Secondary | ICD-10-CM

## 2014-06-13 ENCOUNTER — Other Ambulatory Visit: Payer: Self-pay

## 2014-06-14 ENCOUNTER — Telehealth: Payer: Self-pay | Admitting: Sports Medicine

## 2014-06-14 DIAGNOSIS — M1711 Unilateral primary osteoarthritis, right knee: Secondary | ICD-10-CM

## 2014-06-14 DIAGNOSIS — S838X9A Sprain of other specified parts of unspecified knee, initial encounter: Secondary | ICD-10-CM | POA: Insufficient documentation

## 2014-06-14 DIAGNOSIS — S838X1A Sprain of other specified parts of right knee, initial encounter: Secondary | ICD-10-CM

## 2014-06-14 NOTE — Telephone Encounter (Signed)
Called and spoke to pt regarding MRI results.   Will refer to Dr. Erlinda Hong with [facility] for further evaluation and management. Pt agreeable.

## 2014-06-15 ENCOUNTER — Encounter: Payer: Self-pay | Admitting: *Deleted

## 2014-06-15 NOTE — Patient Instructions (Signed)
Dr. Erlinda Hong [facility] Tuesday 06/20/14 @ [place], [place], [place] [address] [phone]

## 2014-06-19 ENCOUNTER — Encounter: Payer: Self-pay | Admitting: Physician Assistant

## 2014-06-19 ENCOUNTER — Ambulatory Visit: Payer: No Typology Code available for payment source | Attending: Internal Medicine | Admitting: Physician Assistant

## 2014-06-19 VITALS — BP 165/102 | HR 74 | Temp 97.9°F | Resp 18 | Ht 64.0 in | Wt 205.8 lb

## 2014-06-19 DIAGNOSIS — I10 Essential (primary) hypertension: Secondary | ICD-10-CM

## 2014-06-19 DIAGNOSIS — H5711 Ocular pain, right eye: Secondary | ICD-10-CM

## 2014-06-19 DIAGNOSIS — Z79899 Other long term (current) drug therapy: Secondary | ICD-10-CM | POA: Insufficient documentation

## 2014-06-19 DIAGNOSIS — R220 Localized swelling, mass and lump, head: Secondary | ICD-10-CM | POA: Insufficient documentation

## 2014-06-19 DIAGNOSIS — Z791 Long term (current) use of non-steroidal anti-inflammatories (NSAID): Secondary | ICD-10-CM | POA: Insufficient documentation

## 2014-06-19 MED ORDER — LISINOPRIL 40 MG PO TABS: 40.0000 mg | ORAL_TABLET | Freq: Every day | ORAL | Status: DC

## 2014-06-19 MED ORDER — HYDROCHLOROTHIAZIDE 25 MG PO TABS: 25.0000 mg | ORAL_TABLET | Freq: Every day | ORAL | Status: DC

## 2014-06-19 NOTE — Progress Notes (Signed)
Chief Complaint: High blood pressure and right eye pain  Subjective: This is a 54 year old female who takes lisinopril for hypertension. She states that she didn't feel like his work and her uterus wearing off at different parts of the day. She feels headaches frequently she also was daily has sensation of feeling flushed and has some blurred vision occasionally as well. No syncope. No lightheadedness or dizziness. She checks her blood pressure intermittently at home and systolically over the weekend it was 190 mmHg. She is compliant with her medications. She does not smoke.  She also has some right eye pain and she was rubbing her eye over the weekend and noted some growth there. No itching. No redness. No blurred vision on that side.   ROS:  GEN: denies fever or chills, denies change in weight Skin: denies lesions or rashes HEENT: + headache, earache, epistaxis, sore throat, or neck pain; +pain in right eye LUNGS: denies SHOB, dyspnea, PND, orthopnea CV: denies CP or palpitations ABD: denies abd pain, N or V EXT: denies muscle spasms or swelling; no pain in lower ext, no weakness NEURO: denies numbness or tingling, denies sz, stroke or TIA   Objective:  Filed Vitals:   06/19/14 1216  BP: 165/102  Pulse: 74  Temp: 97.9 F (36.6 C)  TempSrc: Oral  Resp: 18  Height: 5\' 4"  (1.626 m)  Weight: 205 lb 12.8 oz (93.35 kg)  SpO2: 99%    Physical Exam:  General: in no acute distress. HEENT: no pallor, no icterus, small nodule inside right eyelid ?sty; moist oral mucosa, no JVD, no lymphadenopathy Heart: Normal  s1 &s2  Regular rate and rhythm, without murmurs, rubs, gallops. Lungs: Clear to auscultation bilaterally. Abdomen: Soft, nontender, nondistended, positive bowel sounds. Extremities: No clubbing cyanosis or edema with positive pedal pulses. Neuro: Alert, awake, oriented x3, nonfocal.  Pertinent Lab Results:none   Medications: Prior to Admission medications   Medication  Sig Start Date End Date Taking? Authorizing Provider  amitriptyline (ELAVIL) 50 MG tablet Take 1 tablet (50 mg total) by mouth at bedtime. 04/26/14   Gerda Diss, DO  atorvastatin (LIPITOR) 10 MG tablet TAKE 1 TABLET BY MOUTH DAILY AT 6 PM. 05/09/14   Lorayne Marek, MD  dexlansoprazole (DEXILANT) 60 MG capsule Take 1 capsule (60 mg total) by mouth daily. 05/24/14   Orvil Feil, NP  hydrochlorothiazide (HYDRODIURIL) 25 MG tablet Take 1 tablet (25 mg total) by mouth daily. 06/19/14   Beverly Suriano Daneil Dan, PA-C  ibuprofen (ADVIL,MOTRIN) 800 MG tablet Take 800 mg by mouth.    Historical Provider, MD  lisinopril (PRINIVIL,ZESTRIL) 40 MG tablet Take 1 tablet (40 mg total) by mouth daily. 06/19/14   Shondale Quinley Daneil Dan, PA-C  ranitidine (ZANTAC) 150 MG tablet Take 1 tablet (150 mg total) by mouth at bedtime. 03/07/14   Lorayne Marek, MD  traMADol (ULTRAM) 50 MG tablet Take 1 tablet (50 mg total) by mouth every 6 (six) hours as needed. 06/07/14   Gerda Diss, DO    Assessment: 1. Hypertension-uncontrolled 2. Right eye pain/nodule  Plan: Continue lisinopril, add HCTZ BMP in 2 weeks Reassured the right eye nodule is likely benign and self limiting  Follow up:2 weeks  The patient was given clear instructions to go to ER or return to medical center if symptoms don't improve, worsen or new problems develop. The patient verbalized understanding. The patient was told to call to get lab results if they haven't heard anything in the next week.  This note has been created with Surveyor, quantity. Any transcriptional errors are unintentional.   Zettie Pho, PA-C 06/19/2014, 12:43 PM

## 2014-06-19 NOTE — Progress Notes (Signed)
Pt presents to clinic with c/o HTN, states bp has been rising with higher 198/119 when she felt dizzy, headache, blurred vision and inability to focus.

## 2014-06-20 ENCOUNTER — Other Ambulatory Visit (HOSPITAL_BASED_OUTPATIENT_CLINIC_OR_DEPARTMENT_OTHER): Payer: Self-pay | Admitting: Orthopaedic Surgery

## 2014-06-21 ENCOUNTER — Encounter (HOSPITAL_BASED_OUTPATIENT_CLINIC_OR_DEPARTMENT_OTHER): Payer: Self-pay | Admitting: *Deleted

## 2014-06-21 NOTE — Pre-Procedure Instructions (Signed)
To come for BMET and EKG 

## 2014-06-23 ENCOUNTER — Encounter (HOSPITAL_BASED_OUTPATIENT_CLINIC_OR_DEPARTMENT_OTHER)
Admission: RE | Admit: 2014-06-23 | Discharge: 2014-06-23 | Disposition: A | Discharge status: Home / Self Care | Payer: No Typology Code available for payment source | Source: Ambulatory Visit | Attending: Orthopaedic Surgery | Admitting: Orthopaedic Surgery

## 2014-06-23 DIAGNOSIS — Z01818 Encounter for other preprocedural examination: Secondary | ICD-10-CM | POA: Insufficient documentation

## 2014-06-23 DIAGNOSIS — M94261 Chondromalacia, right knee: Secondary | ICD-10-CM | POA: Insufficient documentation

## 2014-06-23 LAB — BASIC METABOLIC PANEL
ANION GAP: 5 (ref 5–15)
BUN: 14 mg/dL (ref 6–23)
CALCIUM: 9.3 mg/dL (ref 8.4–10.5)
CO2: 30 mmol/L (ref 19–32)
Chloride: 104 mmol/L (ref 96–112)
Creatinine, Ser: 0.9 mg/dL (ref 0.50–1.10)
GFR calc Af Amer: 84 mL/min — ABNORMAL LOW (ref >90)
GFR, EST NON AFRICAN AMERICAN: 72 mL/min — AB (ref >90)
Glucose, Bld: 106 mg/dL — ABNORMAL HIGH (ref 70–99)
Potassium: 3.5 mmol/L (ref 3.5–5.1)
SODIUM: 139 mmol/L (ref 135–145)

## 2014-06-28 ENCOUNTER — Ambulatory Visit (HOSPITAL_BASED_OUTPATIENT_CLINIC_OR_DEPARTMENT_OTHER)
Admission: RE | Admit: 2014-06-28 | Discharge: 2014-06-28 | Disposition: A | Discharge status: Home / Self Care | Payer: Self-pay | Source: Ambulatory Visit | Attending: Orthopaedic Surgery | Admitting: Orthopaedic Surgery

## 2014-06-28 ENCOUNTER — Encounter (HOSPITAL_BASED_OUTPATIENT_CLINIC_OR_DEPARTMENT_OTHER)
Admission: RE | Disposition: A | Discharge status: Home / Self Care | Payer: Self-pay | Source: Ambulatory Visit | Attending: Orthopaedic Surgery

## 2014-06-28 ENCOUNTER — Ambulatory Visit (HOSPITAL_BASED_OUTPATIENT_CLINIC_OR_DEPARTMENT_OTHER): Payer: No Typology Code available for payment source | Admitting: Certified Registered"

## 2014-06-28 ENCOUNTER — Ambulatory Visit (HOSPITAL_BASED_OUTPATIENT_CLINIC_OR_DEPARTMENT_OTHER): Payer: Self-pay | Admitting: Certified Registered"

## 2014-06-28 ENCOUNTER — Encounter (HOSPITAL_BASED_OUTPATIENT_CLINIC_OR_DEPARTMENT_OTHER): Payer: Self-pay | Admitting: *Deleted

## 2014-06-28 DIAGNOSIS — Y939 Activity, unspecified: Secondary | ICD-10-CM | POA: Insufficient documentation

## 2014-06-28 DIAGNOSIS — M2241 Chondromalacia patellae, right knee: Secondary | ICD-10-CM | POA: Insufficient documentation

## 2014-06-28 DIAGNOSIS — M13861 Other specified arthritis, right knee: Secondary | ICD-10-CM | POA: Insufficient documentation

## 2014-06-28 DIAGNOSIS — E785 Hyperlipidemia, unspecified: Secondary | ICD-10-CM | POA: Insufficient documentation

## 2014-06-28 DIAGNOSIS — Z96652 Presence of left artificial knee joint: Secondary | ICD-10-CM | POA: Insufficient documentation

## 2014-06-28 DIAGNOSIS — Y929 Unspecified place or not applicable: Secondary | ICD-10-CM | POA: Insufficient documentation

## 2014-06-28 DIAGNOSIS — Y999 Unspecified external cause status: Secondary | ICD-10-CM | POA: Insufficient documentation

## 2014-06-28 DIAGNOSIS — S83241A Other tear of medial meniscus, current injury, right knee, initial encounter: Secondary | ICD-10-CM | POA: Insufficient documentation

## 2014-06-28 DIAGNOSIS — Z79899 Other long term (current) drug therapy: Secondary | ICD-10-CM | POA: Insufficient documentation

## 2014-06-28 DIAGNOSIS — X58XXXA Exposure to other specified factors, initial encounter: Secondary | ICD-10-CM | POA: Insufficient documentation

## 2014-06-28 DIAGNOSIS — K219 Gastro-esophageal reflux disease without esophagitis: Secondary | ICD-10-CM | POA: Insufficient documentation

## 2014-06-28 DIAGNOSIS — I10 Essential (primary) hypertension: Secondary | ICD-10-CM | POA: Insufficient documentation

## 2014-06-28 DIAGNOSIS — Z79891 Long term (current) use of opiate analgesic: Secondary | ICD-10-CM | POA: Insufficient documentation

## 2014-06-28 HISTORY — PX: CHONDROPLASTY: SHX5177

## 2014-06-28 HISTORY — DX: Unspecified osteoarthritis, unspecified site: M19.90

## 2014-06-28 HISTORY — DX: Diaphragmatic hernia without obstruction or gangrene: K44.9

## 2014-06-28 HISTORY — PX: KNEE ARTHROSCOPY WITH MEDIAL MENISECTOMY: SHX5651

## 2014-06-28 LAB — POCT HEMOGLOBIN-HEMACUE: HEMOGLOBIN: 12.9 g/dL (ref 12.0–15.0)

## 2014-06-28 SURGERY — ARTHROSCOPY, KNEE, WITH MEDIAL MENISCECTOMY
Anesthesia: General | Site: Knee | Laterality: Right

## 2014-06-28 MED ORDER — PROMETHAZINE HCL 25 MG/ML IJ SOLN
INTRAMUSCULAR | Status: AC
  Filled 2014-06-28: qty 1

## 2014-06-28 MED ORDER — PROMETHAZINE HCL 25 MG/ML IJ SOLN
6.2500 mg | INTRAMUSCULAR | Status: DC | PRN
  Administered 2014-06-28: 6.25 mg via INTRAVENOUS

## 2014-06-28 MED ORDER — OXYCODONE HCL 5 MG PO TABS
ORAL_TABLET | ORAL | Status: AC
  Filled 2014-06-28: qty 1

## 2014-06-28 MED ORDER — LIDOCAINE HCL (CARDIAC) 20 MG/ML IV SOLN
INTRAVENOUS | Status: DC | PRN
  Administered 2014-06-28: 60 mg via INTRAVENOUS

## 2014-06-28 MED ORDER — FENTANYL CITRATE 0.05 MG/ML IJ SOLN
INTRAMUSCULAR | Status: DC | PRN
  Administered 2014-06-28 (×2): 25 ug via INTRAVENOUS
  Administered 2014-06-28: 100 ug via INTRAVENOUS
  Administered 2014-06-28: 25 ug via INTRAVENOUS
  Administered 2014-06-28: 50 ug via INTRAVENOUS

## 2014-06-28 MED ORDER — ONDANSETRON HCL 4 MG/2ML IJ SOLN
INTRAMUSCULAR | Status: DC | PRN
  Administered 2014-06-28: 4 mg via INTRAVENOUS

## 2014-06-28 MED ORDER — PROPOFOL 10 MG/ML IV EMUL
INTRAVENOUS | Status: AC
  Filled 2014-06-28: qty 50

## 2014-06-28 MED ORDER — ASPIRIN EC 325 MG PO TBEC: 325.0000 mg | DELAYED_RELEASE_TABLET | Freq: Two times a day (BID) | ORAL | Status: DC

## 2014-06-28 MED ORDER — BUPIVACAINE HCL (PF) 0.25 % IJ SOLN
INTRAMUSCULAR | Status: DC | PRN
  Administered 2014-06-28: 20 mL via INTRA_ARTICULAR

## 2014-06-28 MED ORDER — OXYCODONE HCL 5 MG PO TABS: 5.0000 mg | ORAL_TABLET | ORAL | Status: DC | PRN

## 2014-06-28 MED ORDER — MIDAZOLAM HCL 2 MG/ML PO SYRP: 12.0000 mg | ORAL_SOLUTION | Freq: Once | ORAL | Status: DC | PRN

## 2014-06-28 MED ORDER — CEFAZOLIN SODIUM-DEXTROSE 2-3 GM-% IV SOLR
2.0000 g | INTRAVENOUS | Status: AC
  Administered 2014-06-28: 2 g via INTRAVENOUS

## 2014-06-28 MED ORDER — PROPOFOL 10 MG/ML IV BOLUS
INTRAVENOUS | Status: DC | PRN
  Administered 2014-06-28: 200 mg via INTRAVENOUS

## 2014-06-28 MED ORDER — FENTANYL CITRATE 0.05 MG/ML IJ SOLN
INTRAMUSCULAR | Status: AC
  Filled 2014-06-28: qty 6

## 2014-06-28 MED ORDER — LACTATED RINGERS IV SOLN
INTRAVENOUS | Status: DC
  Administered 2014-06-28 (×3): via INTRAVENOUS

## 2014-06-28 MED ORDER — SUCCINYLCHOLINE CHLORIDE 20 MG/ML IJ SOLN
INTRAMUSCULAR | Status: DC | PRN
  Administered 2014-06-28: 100 mg via INTRAVENOUS

## 2014-06-28 MED ORDER — MIDAZOLAM HCL 5 MG/5ML IJ SOLN
INTRAMUSCULAR | Status: DC | PRN
  Administered 2014-06-28: 2 mg via INTRAVENOUS

## 2014-06-28 MED ORDER — CEFAZOLIN SODIUM-DEXTROSE 2-3 GM-% IV SOLR
INTRAVENOUS | Status: AC
  Filled 2014-06-28: qty 50

## 2014-06-28 MED ORDER — SODIUM CHLORIDE 0.9 % IR SOLN
Status: DC | PRN
  Administered 2014-06-28: 4500 mL

## 2014-06-28 MED ORDER — HYDROMORPHONE HCL 1 MG/ML IJ SOLN
INTRAMUSCULAR | Status: AC
  Filled 2014-06-28: qty 1

## 2014-06-28 MED ORDER — HYDROMORPHONE HCL 1 MG/ML IJ SOLN
0.2500 mg | INTRAMUSCULAR | Status: DC | PRN
  Administered 2014-06-28: 0.25 mg via INTRAVENOUS
  Administered 2014-06-28: 0.5 mg via INTRAVENOUS
  Administered 2014-06-28: 0.25 mg via INTRAVENOUS

## 2014-06-28 MED ORDER — MIDAZOLAM HCL 2 MG/2ML IJ SOLN
INTRAMUSCULAR | Status: AC
  Filled 2014-06-28: qty 2

## 2014-06-28 MED ORDER — DEXAMETHASONE SODIUM PHOSPHATE 10 MG/ML IJ SOLN
INTRAMUSCULAR | Status: DC | PRN
  Administered 2014-06-28: 10 mg via INTRAVENOUS

## 2014-06-28 MED ORDER — FENTANYL CITRATE 0.05 MG/ML IJ SOLN: 50.0000 ug | INTRAMUSCULAR | Status: DC | PRN

## 2014-06-28 MED ORDER — OXYCODONE HCL 5 MG PO TABS
5.0000 mg | ORAL_TABLET | Freq: Once | ORAL | Status: AC | PRN
  Administered 2014-06-28: 5 mg via ORAL

## 2014-06-28 MED ORDER — SODIUM CHLORIDE 0.9 % IJ SOLN
INTRAMUSCULAR | Status: AC
  Filled 2014-06-28: qty 10

## 2014-06-28 MED ORDER — MIDAZOLAM HCL 2 MG/2ML IJ SOLN: 1.0000 mg | INTRAMUSCULAR | Status: DC | PRN

## 2014-06-28 SURGICAL SUPPLY — 43 items
BANDAGE ELASTIC 6 VELCRO ST LF (GAUZE/BANDAGES/DRESSINGS) ×3 IMPLANT
BANDAGE ESMARK 6X9 LF (GAUZE/BANDAGES/DRESSINGS) ×1 IMPLANT
BLADE 4.2CUDA (BLADE) ×3 IMPLANT
BLADE CUDA GRT WHITE 3.5 (BLADE) IMPLANT
BLADE CUDA SHAVER 3.5 (BLADE) IMPLANT
BLADE CUTTER GATOR 3.5 (BLADE) IMPLANT
BNDG CMPR 9X6 STRL LF SNTH (GAUZE/BANDAGES/DRESSINGS) ×1
BNDG ESMARK 6X9 LF (GAUZE/BANDAGES/DRESSINGS) ×3
CUFF TOURNIQUET SINGLE 34IN LL (TOURNIQUET CUFF) ×2 IMPLANT
DRAPE ARTHROSCOPY W/POUCH 90 (DRAPES) ×3 IMPLANT
DRAPE SURG 17X23 STRL (DRAPES) ×4 IMPLANT
DRAPE U-SHAPE 47X51 STRL (DRAPES) ×1 IMPLANT
DURAPREP 26ML APPLICATOR (WOUND CARE) ×3 IMPLANT
ELECT MENISCUS 165MM 90D (ELECTRODE) IMPLANT
ELECT REM PT RETURN 9FT ADLT (ELECTROSURGICAL)
ELECTRODE REM PT RTRN 9FT ADLT (ELECTROSURGICAL) IMPLANT
GAUZE SPONGE 4X4 12PLY STRL (GAUZE/BANDAGES/DRESSINGS) ×3 IMPLANT
GAUZE XEROFORM 1X8 LF (GAUZE/BANDAGES/DRESSINGS) ×3 IMPLANT
GLOVE BIO SURGEON STRL SZ8.5 (GLOVE) ×2 IMPLANT
GLOVE BIOGEL PI IND STRL 7.0 (GLOVE) IMPLANT
GLOVE BIOGEL PI INDICATOR 7.0 (GLOVE) ×2
GLOVE ECLIPSE 6.5 STRL STRAW (GLOVE) ×2 IMPLANT
GLOVE EXAM NITRILE LRG STRL (GLOVE) ×2 IMPLANT
GLOVE NEODERM STRL 7.5 LF PF (GLOVE) ×1 IMPLANT
GLOVE SURG NEODERM 7.5  LF PF (GLOVE) ×2
GLOVE SURG SYN 7.5  E (GLOVE) ×2
GLOVE SURG SYN 7.5 E (GLOVE) ×1 IMPLANT
GLOVE SURG SYN 7.5 PF PI (GLOVE) ×1 IMPLANT
GOWN STRL REIN XL XLG (GOWN DISPOSABLE) ×3 IMPLANT
GOWN STRL REUS W/ TWL LRG LVL3 (GOWN DISPOSABLE) ×1 IMPLANT
GOWN STRL REUS W/TWL LRG LVL3 (GOWN DISPOSABLE) ×3
IV NS IRRIG 3000ML ARTHROMATIC (IV SOLUTION) ×4 IMPLANT
KNEE WRAP E Z 3 GEL PACK (MISCELLANEOUS) ×3 IMPLANT
MANIFOLD NEPTUNE II (INSTRUMENTS) ×3 IMPLANT
PACK ARTHROSCOPY DSU (CUSTOM PROCEDURE TRAY) ×3 IMPLANT
PACK BASIN DAY SURGERY FS (CUSTOM PROCEDURE TRAY) ×3 IMPLANT
SET ARTHROSCOPY TUBING (MISCELLANEOUS) ×3
SET ARTHROSCOPY TUBING LN (MISCELLANEOUS) ×1 IMPLANT
SLEEVE SCD COMPRESS KNEE MED (MISCELLANEOUS) ×3 IMPLANT
SUT ETHILON 2 0 FS 18 (SUTURE) ×3 IMPLANT
TOWEL OR 17X24 6PK STRL BLUE (TOWEL DISPOSABLE) ×3 IMPLANT
TOWEL OR NON WOVEN STRL DISP B (DISPOSABLE) ×2 IMPLANT
WATER STERILE IRR 1000ML POUR (IV SOLUTION) ×3 IMPLANT

## 2014-06-28 NOTE — Progress Notes (Signed)
Patient complain of severe need to void,  Unable to void on bedpan,  Pt alert and very uncomfortable, moved down to phase 2 and ambulated to bathroom without problems.  Patient able to void and felt much better but experienced nausea with movement, Place in room 12 and report to [employer]

## 2014-06-28 NOTE — Anesthesia Procedure Notes (Signed)
Procedure Name: Intubation Date/Time: 06/28/2014 8:14 AM Performed by: Abdul Beirne Pre-anesthesia Checklist: Patient identified, Emergency Drugs available, Suction available and Patient being monitored Patient Re-evaluated:Patient Re-evaluated prior to inductionOxygen Delivery Method: Circle System Utilized Preoxygenation: Pre-oxygenation with 100% oxygen Intubation Type: IV induction and Cricoid Pressure applied Ventilation: Mask ventilation without difficulty Laryngoscope Size: Mac and 3 Grade View: Grade I Tube type: Oral Number of attempts: 1 Airway Equipment and Method: Stylet and Oral airway Placement Confirmation: ETT inserted through vocal cords under direct vision,  positive ETCO2 and breath sounds checked- equal and bilateral Tube secured with: Tape Dental Injury: Teeth and Oropharynx as per pre-operative assessment

## 2014-06-28 NOTE — Anesthesia Postprocedure Evaluation (Signed)
  Anesthesia Post-op Note  Patient: [name]  Procedure(s) Performed: Procedure(s): RIGHT KNEE ARTHROSCOPY WITH PARTIAL MEDIAL MENISCECTOMY AND CHONDROPLASTY (Right) CHONDROPLASTY (Right)  Patient Location: PACU  Anesthesia Type:General  Level of Consciousness: awake and alert   Airway and Oxygen Therapy: Patient Spontanous Breathing  Post-op Pain: mild  Post-op Assessment: Post-op Vital signs reviewed  Post-op Vital Signs: stable  Last Vitals:  Filed Vitals:   06/28/14 0952  BP:   Pulse: 94  Temp:   Resp: 18    Complications: No apparent anesthesia complications

## 2014-06-28 NOTE — Anesthesia Preprocedure Evaluation (Addendum)
Anesthesia Evaluation  Patient identified by MRN, date of birth, ID band Patient awake    Reviewed: Allergy & Precautions, NPO status   Airway Mallampati: I       Dental  (+) Teeth Intact   Pulmonary shortness of breath,  breath sounds clear to auscultation        Cardiovascular hypertension, Pt. on medications Rhythm:Regular Rate:Normal     Neuro/Psych  Neuromuscular disease    GI/Hepatic hiatal hernia, GERD-  Medicated,  Endo/Other    Renal/GU      Musculoskeletal  (+) Arthritis -,   Abdominal   Peds  Hematology   Anesthesia Other Findings   Reproductive/Obstetrics                            Anesthesia Physical Anesthesia Plan  ASA: II  Anesthesia Plan: General   Post-op Pain Management:    Induction: Intravenous  Airway Management Planned: Oral ETT  Additional Equipment:   Intra-op Plan:   Post-operative Plan: Extubation in OR  Informed Consent: I have reviewed the patients History and Physical, chart, labs and discussed the procedure including the risks, benefits and alternatives for the proposed anesthesia with the patient or authorized representative who has indicated his/her understanding and acceptance.   Dental advisory given  Plan Discussed with: CRNA and Surgeon  Anesthesia Plan Comments:         Anesthesia Quick Evaluation

## 2014-06-28 NOTE — Discharge Instructions (Signed)
1. Remove surgical dressings on Friday.  Place band aids on incisions. 2. May shower on Friday.   3. Increase activity as tolerated 4. Ice knee around the clock 15 minutes at a time.  Discharge Instructions After Orthopedic Procedures:  *You may feel tired and weak following your procedure. It is recommended that you limit physical activity for the next 24 hours and rest at home for the remainder of today and tomorrow. *No strenuous activity should be started without your doctor's permission.  Elevate the extremity that you had surgery on to a level above your heart. This should continue for 48 hours or as instructed by your doctor.  If you had hand, arm or shoulder surgery you should move your fingers frequently unless otherwise instructed by your doctor.  If you had foot, knee or leg surgery you should wiggle your toes frequently unless otherwise instructed by your doctor.  Follow your doctor's exact instructions for activity at home. Use your home equipment as instructed. (Crutches, hard shoes, slings etc.)  Limit your activity as instructed by your doctor.  Report to your doctor should any of the following occur: 1. Extreme swelling of your fingers or toes. 2. Inability to wiggle your fingers or toes. 3. Coldness, pale or bluish color in your fingers or toes. 4. Loss of sensation, numbness or tingling of your fingers or toes. 5. Unusual smell or odor from under your dressing or cast. 6. Excessive bleeding or drainage from the surgical site. 7. Pain not relieved by medication your doctor has prescribed for you. 8. Cast or dressing too tight (do not get your dressing or cast wet or put anything under your dressing or cast.) 9. Fever of 101 or greater  *Do not change your dressing unless instructed by your doctor or discharge nurse. Then follow exact instructions.  *Follow labeled instructions for any medications that your doctor may have prescribed for you. *Should any questions  or complications develop following your procedure, PLEASE CONTACT YOUR DOCTOR.   Post Anesthesia Home Care Instructions  Activity: Get plenty of rest for the remainder of the day. A responsible adult should stay with you for 24 hours following the procedure.  For the next 24 hours, DO NOT: -Drive a car -Paediatric nurse -Drink alcoholic beverages -Take any medication unless instructed by your physician -Make any legal decisions or sign important papers.  Meals: Start with liquid foods such as gelatin or soup. Progress to regular foods as tolerated. Avoid greasy, spicy, heavy foods. If nausea and/or vomiting occur, drink only clear liquids until the nausea and/or vomiting subsides. Call your physician if vomiting continues.  Special Instructions/Symptoms: Your throat may feel dry or sore from the anesthesia or the breathing tube placed in your throat during surgery. If this causes discomfort, gargle with warm salt water. The discomfort should disappear within 24 hours.

## 2014-06-28 NOTE — H&P (Signed)
PREOPERATIVE H&P  Chief Complaint: Right knee medial meniscal tear  HPI: [name] is a 54 y.o. female who presents for surgical treatment of Right knee medial meniscal tear.  She denies any changes in medical history.  Past Medical History  Diagnosis Date  . Hyperlipidemia   . Gallstones   . Acid reflux   . Arthritis     right knee  . Hypertension     has added a second antihypertensive medication 06/2014  . Adrenal adenoma     is being monitored at [facility]  . Medial meniscus tear 06/2014    right knee  . Paraesophageal hernia    Past Surgical History  Procedure Laterality Date  . Total knee arthroplasty Left 04/08/2004  . Ganglion cyst excision Left 01/13/2002  . Colonoscopy N/A 05/29/2014    Procedure: COLONOSCOPY;  Surgeon: Daneil Dolin, MD;  [facility];  Service: Endoscopy;  Laterality: N/A;  215pm- Pt is working until 12:00 so she can't come any earlier  . Esophagogastroduodenoscopy N/A 05/29/2014    Procedure: ESOPHAGOGASTRODUODENOSCOPY (EGD);  Surgeon: Daneil Dolin, MD;  [facility];  Service: Endoscopy;  Laterality: N/A;  Venia Minks dilation N/A 05/29/2014    Procedure: Venia Minks DILATION;  Surgeon: Daneil Dolin, MD;  [facility];  Service: Endoscopy;  Laterality: N/A;  . Carpal tunnel release Left 01/06/2008   History   Social History  . Marital Status: Divorced    Spouse Name: N/A  . Number of Children: N/A  . Years of Education: N/A   Social History Main Topics  . Smoking status: Never Smoker   . Smokeless tobacco: Never Used  . Alcohol Use: No  . Drug Use: No  . Sexual Activity: Not Currently    Birth Control/ Protection: None   Other Topics Concern  . None   Social History Narrative   Family History  Problem Relation Age of Onset  . Cancer Father     lung  . Breast cancer Sister   . Cancer Maternal Grandfather     lung  . Cancer Paternal Grandfather     lung  . Colon cancer Neg Hx    No Known  Allergies Prior to Admission medications   Medication Sig Start Date End Date Taking? Authorizing Provider  atorvastatin (LIPITOR) 10 MG tablet TAKE 1 TABLET BY MOUTH DAILY AT 6 PM. 05/09/14  Yes Deepak Advani, MD  hydrochlorothiazide (HYDRODIURIL) 25 MG tablet Take 1 tablet (25 mg total) by mouth daily. 06/19/14  Yes Tiffany Daneil Dan, PA-C  lisinopril (PRINIVIL,ZESTRIL) 40 MG tablet Take 1 tablet (40 mg total) by mouth daily. 06/19/14  Yes Tiffany Daneil Dan, PA-C  omeprazole (PRILOSEC) 40 MG capsule Take 40 mg by mouth 2 (two) times daily.   Yes Historical Provider, MD  ranitidine (ZANTAC) 150 MG tablet Take 150 mg by mouth 2 (two) times daily.   Yes Historical Provider, MD  traMADol (ULTRAM) 50 MG tablet Take 1 tablet (50 mg total) by mouth every 6 (six) hours as needed. 06/07/14  Yes Gerda Diss, DO  traZODone (DESYREL) 50 MG tablet Take 50 mg by mouth at bedtime.   Yes Historical Provider, MD     Positive ROS: All other systems have been reviewed and were otherwise negative with the exception of those mentioned in the HPI and as above.  Physical Exam: General: Alert, no acute distress Cardiovascular: No pedal edema Respiratory: No cyanosis, no use of accessory musculature GI: abdomen soft  Skin: No lesions in the area of chief complaint Neurologic: Sensation intact distally Psychiatric: Patient is competent for consent with normal mood and affect Lymphatic: no lymphedema  MUSCULOSKELETAL: exam stable  Assessment: Right knee medial meniscal tear  Plan: Plan for Procedure(s): RIGHT KNEE ARTHROSCOPY WITH PARTIAL MEDIAL MENISCECTOMY  The risks benefits and alternatives were discussed with the patient including but not limited to the risks of nonoperative treatment, versus surgical intervention including infection, bleeding, nerve injury,  blood clots, cardiopulmonary complications, morbidity, mortality, among others, and they were willing to proceed.   Marianna Payment,  MD   06/28/2014 7:37 AM

## 2014-06-28 NOTE — Op Note (Signed)
Date of surgery: 06/28/2014  Preoperative diagnosis: 1. Medial compartment chondromalacia 2. Medial meniscal tear posterior horn 3. Chondromalacia patella X  Postoperative diagnosis: Same  Procedure: 1. Right knee arthroscopic partial medial meniscectomy of the posterior horn 2. Right knee arthroscopic chondroplasty of the medial femoral condyle 3. Right knee arthroscopic chondroplasty of the patella  Findings: 1. 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 Grade 2 chondromalacia patella 3. Radial tear of posterior horn medial meniscus  Surgeon: Eduard Roux, M.D.  Anesthesia: General  Estimated blood loss: Minimal  Complications: None  Indications for procedure: [name] is a 54 year old female who presents today for surgical treatment of the above-mentioned conditions after failing extensive conservative treatment. The risks, benefits, and alternatives to surgery were discussed with patient and she elected to proceed. Risks including infection nerve damage failure to achieve the desired results. Next  Description of procedure: The patient was identified in the preoperative holding area. The operative site was marked by the surgeon confirmed with the patient. She was brought back to the operating room. She was placed supine on the table. General anesthesia was induced. The right lower extremity was prepped and draped in standard sterile fashion. Timeout was performed. Preoperative antibiotic for given. The standard anterolateral anteromedial portals to the knee were established. We first performed a diagnostic arthroscopy of the knee. We visualized the medial compartment which showed grade 3-4 chondromalacia of the medial femoral condyle and medial tibial plateau. We then used a probe to assess the integrity of the medial meniscus which did show a radial tear of the posterior horn. We then visualized the femoral notch which showed intact anterior cruciate ligament. We then  visualized the lateral compartment which showed no signs of disease. We then moved into the patellofemoral joint which did show grade 2 chondral malacia of the patella on the lateral facet. The patella did track normally. We then returned back to the medial compartment to perform the partial meniscectomy using a series of biters and oscillating shavers I was able to address the meniscal tear. This was debrided back to a stable surface. This was then probed to assess for stability. We then performed chondroplasty of the medial femoral condyle and the medial tibial plateau. We then moved back into the patellofemoral joint and performed chondroplasty of the lateral facet of the patella with the oscillating shaver. Final arthroscopic pictures were taken. Local anesthesia was infiltrated  Incision.  The patient tolerated the procedure well and was explained transferred to the PACU in stable condition.   Postoperative plan: Patient will be weightbearing as tolerated to the right lower extremity. She will be discharged home. She will be placed on aspirin for DVT prophylaxis given her history of DVT.    Brandi Cecil, MD [place] 9:12 AM

## 2014-06-28 NOTE — Transfer of Care (Signed)
Immediate Anesthesia Transfer of Care Note  Patient: [name]  Procedure(s) Performed: Procedure(s): RIGHT KNEE ARTHROSCOPY WITH PARTIAL MEDIAL MENISCECTOMY AND CHONDROPLASTY (Right) CHONDROPLASTY (Right)  Patient Location: PACU  Anesthesia Type:General  Level of Consciousness: awake, alert , oriented and patient cooperative  Airway & Oxygen Therapy: Patient Spontanous Breathing and Patient connected to face mask oxygen  Post-op Assessment: Report given to RN and Post -op Vital signs reviewed and stable  Post vital signs: Reviewed and stable  Last Vitals:  Filed Vitals:   06/28/14 0707  BP: 158/95  Pulse: 81  Temp: 36.7 C  Resp: 18    Complications: No apparent anesthesia complications

## 2014-06-29 ENCOUNTER — Encounter (HOSPITAL_BASED_OUTPATIENT_CLINIC_OR_DEPARTMENT_OTHER): Payer: Self-pay | Admitting: Orthopaedic Surgery

## 2014-07-05 ENCOUNTER — Ambulatory Visit: Payer: No Typology Code available for payment source | Attending: Internal Medicine | Admitting: Internal Medicine

## 2014-07-05 ENCOUNTER — Encounter: Payer: Self-pay | Admitting: Internal Medicine

## 2014-07-05 VITALS — BP 136/86 | HR 60 | Temp 98.0°F | Resp 16 | Wt 205.6 lb

## 2014-07-05 DIAGNOSIS — N951 Menopausal and female climacteric states: Secondary | ICD-10-CM

## 2014-07-05 DIAGNOSIS — Z96652 Presence of left artificial knee joint: Secondary | ICD-10-CM | POA: Insufficient documentation

## 2014-07-05 DIAGNOSIS — Z7982 Long term (current) use of aspirin: Secondary | ICD-10-CM | POA: Insufficient documentation

## 2014-07-05 DIAGNOSIS — I10 Essential (primary) hypertension: Secondary | ICD-10-CM

## 2014-07-05 DIAGNOSIS — E785 Hyperlipidemia, unspecified: Secondary | ICD-10-CM | POA: Insufficient documentation

## 2014-07-05 DIAGNOSIS — Z79899 Other long term (current) drug therapy: Secondary | ICD-10-CM | POA: Insufficient documentation

## 2014-07-05 DIAGNOSIS — K219 Gastro-esophageal reflux disease without esophagitis: Secondary | ICD-10-CM | POA: Insufficient documentation

## 2014-07-05 DIAGNOSIS — R232 Flushing: Secondary | ICD-10-CM | POA: Insufficient documentation

## 2014-07-05 LAB — COMPLETE METABOLIC PANEL WITH GFR
ALK PHOS: 99 U/L (ref 39–117)
ALT: 18 U/L (ref 0–35)
AST: 16 U/L (ref 0–37)
Albumin: 4.2 g/dL (ref 3.5–5.2)
BILIRUBIN TOTAL: 0.4 mg/dL (ref 0.2–1.2)
BUN: 14 mg/dL (ref 6–23)
CHLORIDE: 100 meq/L (ref 96–112)
CO2: 30 mEq/L (ref 19–32)
CREATININE: 0.96 mg/dL (ref 0.50–1.10)
Calcium: 9.5 mg/dL (ref 8.4–10.5)
GFR, EST AFRICAN AMERICAN: 79 mL/min
GFR, EST NON AFRICAN AMERICAN: 68 mL/min
Glucose, Bld: 86 mg/dL (ref 70–99)
Potassium: 4.7 mEq/L (ref 3.5–5.3)
Sodium: 138 mEq/L (ref 135–145)
Total Protein: 7 g/dL (ref 6.0–8.3)

## 2014-07-05 NOTE — Patient Instructions (Signed)
DASH Eating Plan °DASH stands for "Dietary Approaches to Stop Hypertension." The DASH eating plan is a healthy eating plan that has been shown to reduce high blood pressure (hypertension). Additional health benefits may include reducing the risk of type 2 diabetes mellitus, heart disease, and stroke. The DASH eating plan may also help with weight loss. °WHAT DO I NEED TO KNOW ABOUT THE DASH EATING PLAN? °For the DASH eating plan, you will follow these general guidelines: °· Choose foods with a percent daily value for sodium of less than 5% (as listed on the food label). °· Use salt-free seasonings or herbs instead of table salt or sea salt. °· Check with your health care provider or pharmacist before using salt substitutes. °· Eat lower-sodium products, often labeled as "lower sodium" or "no salt added." °· Eat fresh foods. °· Eat more vegetables, fruits, and low-fat dairy products. °· Choose whole grains. Look for the word "whole" as the first word in the ingredient list. °· Choose fish and skinless chicken or turkey more often than red meat. Limit fish, poultry, and meat to 6 oz (170 g) each day. °· Limit sweets, desserts, sugars, and sugary drinks. °· Choose heart-healthy fats. °· Limit cheese to 1 oz (28 g) per day. °· Eat more home-cooked food and less restaurant, buffet, and fast food. °· Limit fried foods. °· Cook foods using methods other than frying. °· Limit canned vegetables. If you do use them, rinse them well to decrease the sodium. °· When eating at a restaurant, ask that your food be prepared with less salt, or no salt if possible. °WHAT FOODS CAN I EAT? °Seek help from a dietitian for individual calorie needs. °Grains °Whole grain or whole wheat bread. Brown rice. Whole grain or whole wheat pasta. Quinoa, bulgur, and whole grain cereals. Low-sodium cereals. Corn or whole wheat flour tortillas. Whole grain cornbread. Whole grain crackers. Low-sodium crackers. °Vegetables °Fresh or frozen vegetables  (raw, steamed, roasted, or grilled). Low-sodium or reduced-sodium tomato and vegetable juices. Low-sodium or reduced-sodium tomato sauce and paste. Low-sodium or reduced-sodium canned vegetables.  °Fruits °All fresh, canned (in natural juice), or frozen fruits. °Meat and Other Protein Products °Ground beef (85% or leaner), grass-fed beef, or beef trimmed of fat. Skinless chicken or turkey. Ground chicken or turkey. Pork trimmed of fat. All fish and seafood. Eggs. Dried beans, peas, or lentils. Unsalted nuts and seeds. Unsalted canned beans. °Dairy °Low-fat dairy products, such as skim or 1% milk, 2% or reduced-fat cheeses, low-fat ricotta or cottage cheese, or plain low-fat yogurt. Low-sodium or reduced-sodium cheeses. °Fats and Oils °Tub margarines without trans fats. Light or reduced-fat mayonnaise and salad dressings (reduced sodium). Avocado. Safflower, olive, or canola oils. Natural peanut or almond butter. °Other °Unsalted popcorn and pretzels. °The items listed above may not be a complete list of recommended foods or beverages. Contact your dietitian for more options. °WHAT FOODS ARE NOT RECOMMENDED? °Grains °White bread. White pasta. White rice. Refined cornbread. Bagels and croissants. Crackers that contain trans fat. °Vegetables °Creamed or fried vegetables. Vegetables in a cheese sauce. Regular canned vegetables. Regular canned tomato sauce and paste. Regular tomato and vegetable juices. °Fruits °Dried fruits. Canned fruit in light or heavy syrup. Fruit juice. °Meat and Other Protein Products °Fatty cuts of meat. Ribs, chicken wings, bacon, sausage, bologna, salami, chitterlings, fatback, hot dogs, bratwurst, and packaged luncheon meats. Salted nuts and seeds. Canned beans with salt. °Dairy °Whole or 2% milk, cream, half-and-half, and cream cheese. Whole-fat or sweetened yogurt. Full-fat   cheeses or blue cheese. Nondairy creamers and whipped toppings. Processed cheese, cheese spreads, or cheese  curds. °Condiments °Onion and garlic salt, seasoned salt, table salt, and sea salt. Canned and packaged gravies. Worcestershire sauce. Tartar sauce. Barbecue sauce. Teriyaki sauce. Soy sauce, including reduced sodium. Steak sauce. Fish sauce. Oyster sauce. Cocktail sauce. Horseradish. Ketchup and mustard. Meat flavorings and tenderizers. Bouillon cubes. Hot sauce. Tabasco sauce. Marinades. Taco seasonings. Relishes. °Fats and Oils °Butter, stick margarine, lard, shortening, ghee, and bacon fat. Coconut, palm kernel, or palm oils. Regular salad dressings. °Other °Pickles and olives. Salted popcorn and pretzels. °The items listed above may not be a complete list of foods and beverages to avoid. Contact your dietitian for more information. °WHERE CAN I FIND MORE INFORMATION? °[employer], [employer], [employer]: www.nhlbi.nih.gov/health/health-topics/topics/dash/ °Document Released: 04/17/2011 Document Revised: 09/12/2013 Document Reviewed: 03/02/2013 °[employer]® Patient Information ©2015 [employer]. This information is not intended to replace advice given to you by your health care provider. Make sure you discuss any questions you have with your health care provider. ° °

## 2014-07-05 NOTE — Progress Notes (Signed)
Patient here for follow up on her HTN per Dr Ena Dawley

## 2014-07-05 NOTE — Progress Notes (Signed)
MRN: [id number] Name: [name]  Sex: female Age: 54 y.o. DOB: Feb 18, 1961  Allergies: Review of patient's allergies indicates no known allergies.  Chief Complaint  Patient presents with  . Follow-up    HPI: Patient is 54 y.o. female who has history of hypertension, was seen in our office 2-3 weeks ago her blood pressure at that time was elevated,she was continued with lisinopril and hydrochlorothiazide was added, today noted her blood pressure is improved, she needs to blood chemistry, she also complains of menopausal symptoms which are usual state night and affects her sleep .  Past Medical History  Diagnosis Date  . Hyperlipidemia   . Gallstones   . Acid reflux   . Arthritis     right knee  . Hypertension     has added a second antihypertensive medication 06/2014  . Adrenal adenoma     is being monitored at [facility]  . Medial meniscus tear 06/2014    right knee  . Paraesophageal hernia     Past Surgical History  Procedure Laterality Date  . Total knee arthroplasty Left 04/08/2004  . Ganglion cyst excision Left 01/13/2002  . Colonoscopy N/A 05/29/2014    Procedure: COLONOSCOPY;  Surgeon: Daneil Dolin, MD;  [facility];  Service: Endoscopy;  Laterality: N/A;  215pm- Pt is working until 12:00 so she can't come any earlier  . Esophagogastroduodenoscopy N/A 05/29/2014    Procedure: ESOPHAGOGASTRODUODENOSCOPY (EGD);  Surgeon: Daneil Dolin, MD;  [facility];  Service: Endoscopy;  Laterality: N/A;  Venia Minks dilation N/A 05/29/2014    Procedure: Venia Minks DILATION;  Surgeon: Daneil Dolin, MD;  [facility];  Service: Endoscopy;  Laterality: N/A;  . Carpal tunnel release Left 01/06/2008  . Knee arthroscopy with medial menisectomy Right 06/28/2014    Procedure: RIGHT KNEE ARTHROSCOPY WITH PARTIAL MEDIAL MENISCECTOMY AND CHONDROPLASTY;  Surgeon: Marianna Payment, MD;  Location: [place];  Service: Orthopedics;  Laterality: Right;   . Chondroplasty Right 06/28/2014    Procedure: CHONDROPLASTY;  Surgeon: Marianna Payment, MD;  Location: [place];  Service: Orthopedics;  Laterality: Right;      Medication List       This list is accurate as of: 07/05/14 11:41 AM.  Always use your most recent med list.               aspirin EC 325 MG tablet  Take 1 tablet (325 mg total) by mouth 2 (two) times daily.     atorvastatin 10 MG tablet  Commonly known as:  LIPITOR  TAKE 1 TABLET BY MOUTH DAILY AT 6 PM.     hydrochlorothiazide 25 MG tablet  Commonly known as:  HYDRODIURIL  Take 1 tablet (25 mg total) by mouth daily.     lisinopril 40 MG tablet  Commonly known as:  PRINIVIL,ZESTRIL  Take 1 tablet (40 mg total) by mouth daily.     omeprazole 40 MG capsule  Commonly known as:  PRILOSEC  Take 40 mg by mouth 2 (two) times daily.     oxyCODONE 5 MG immediate release tablet  Commonly known as:  Oxy IR/ROXICODONE  Take 1-3 tablets (5-15 mg total) by mouth every 4 (four) hours as needed.     ranitidine 150 MG tablet  Commonly known as:  ZANTAC  Take 150 mg by mouth 2 (two) times daily.     traMADol 50 MG tablet  Commonly known as:  Veatrice Bourbon  Take 1 tablet (50 mg total) by mouth every 6 (six) hours as needed.     traZODone 50 MG tablet  Commonly known as:  DESYREL  Take 50 mg by mouth at bedtime.        No orders of the defined types were placed in this encounter.    Immunization History  Administered Date(s) Administered  . Tdap 04/29/2013    Family History  Problem Relation Age of Onset  . Cancer Father     lung  . Breast cancer Sister   . Cancer Maternal Grandfather     lung  . Cancer Paternal Grandfather     lung  . Colon cancer Neg Hx     History  Substance Use Topics  . Smoking status: Never Smoker   . Smokeless tobacco: Never Used  . Alcohol Use: No    Review of Systems   As noted in HPI  Filed Vitals:   07/05/14 1109  BP: 136/86  Pulse: 60  Temp: 98 F  (36.7 C)  Resp: 16    Physical Exam  Physical Exam  Constitutional: No distress.  Eyes: EOM are normal. Pupils are equal, round, and reactive to light.  Cardiovascular: Normal rate and regular rhythm.   Pulmonary/Chest: Breath sounds normal. No respiratory distress. She has no wheezes. She has no rales.  Musculoskeletal: She exhibits no edema.    CBC    Component Value Date/Time   WBC 7.0 04/13/2013 1242   RBC 4.33 04/13/2013 1242   HGB 12.9 06/28/2014 0744   HCT 38.9 04/13/2013 1242   PLT 323 04/13/2013 1242   MCV 89.8 04/13/2013 1242   LYMPHSABS 2.6 04/13/2013 1242   MONOABS 0.8 04/13/2013 1242   EOSABS 0.1 04/13/2013 1242   BASOSABS 0.0 04/13/2013 1242    CMP     Component Value Date/Time   NA 139 06/23/2014 1600   K 3.5 06/23/2014 1600   CL 104 06/23/2014 1600   CO2 30 06/23/2014 1600   GLUCOSE 106* 06/23/2014 1600   BUN 14 06/23/2014 1600   CREATININE 0.90 06/23/2014 1600   CREATININE 0.91 12/26/2013 1459   CALCIUM 9.3 06/23/2014 1600   PROT 6.7 12/26/2013 1459   ALBUMIN 4.3 12/26/2013 1459   AST 18 12/26/2013 1459   ALT 19 12/26/2013 1459   ALKPHOS 82 12/26/2013 1459   BILITOT 0.3 12/26/2013 1459   GFRNONAA 72* 06/23/2014 1600   GFRNONAA 73 12/26/2013 1459   GFRAA 84* 06/23/2014 1600   GFRAA 84 12/26/2013 1459    Lab Results  Component Value Date/Time   CHOL 252* 04/13/2013 12:42 PM    No components found for: HGA1C  Lab Results  Component Value Date/Time   AST 18 12/26/2013 02:59 PM    Assessment and Plan  Essential hypertension - Plan: blood pressure is improved, continue with lisinopril, hydrochlorothiazide, repeat blood chemistryCOMPLETE METABOLIC PANEL WITH GFR  Hot flashes Patient is going to try over-the-counter natural remedies, will follow up on the next visit, if not symptomatically improved, consider trial of SSRI.  Return in about 3 months (around 10/03/2014) for hypertension.   This note has been created with Biomedical engineer. Any transcriptional errors are unintentional.    Lorayne Marek, MD

## 2014-07-06 ENCOUNTER — Telehealth: Payer: Self-pay

## 2014-07-06 NOTE — Telephone Encounter (Signed)
-----   Message from Lorayne Marek, MD sent at 07/06/2014  9:15 AM EST ----- Call and let the Patient know that blood work is normal.

## 2014-07-06 NOTE — Telephone Encounter (Signed)
Patient is aware of her lab results 

## 2014-07-24 ENCOUNTER — Ambulatory Visit: Payer: No Typology Code available for payment source | Attending: Orthopaedic Surgery

## 2014-07-24 DIAGNOSIS — M25561 Pain in right knee: Secondary | ICD-10-CM | POA: Insufficient documentation

## 2014-07-24 DIAGNOSIS — Z96652 Presence of left artificial knee joint: Secondary | ICD-10-CM | POA: Insufficient documentation

## 2014-07-24 DIAGNOSIS — R29898 Other symptoms and signs involving the musculoskeletal system: Secondary | ICD-10-CM

## 2014-07-24 DIAGNOSIS — Z4789 Encounter for other orthopedic aftercare: Secondary | ICD-10-CM | POA: Insufficient documentation

## 2014-07-24 DIAGNOSIS — R531 Weakness: Secondary | ICD-10-CM | POA: Insufficient documentation

## 2014-07-24 NOTE — Patient Instructions (Signed)
Straight Leg Raise  Pull your toes back toward you and tighten the top of the thigh. Tighten stomach and slowly raise locked right leg _12___ inches from floor. Repeat __10__ times per set. Do _2___ sets per session. Do _2-3___ sessions per day.  http://orth.exer.us/1103   Copyright  VHI. All rights reserved.  KNEE: Extension, Long Arc Quads - Sitting   Raise leg until knee is straight.  Hold 5 seconds. _10-20__ reps per set, 2-3___ sets per day.  Copyright  VHI. All rights reserved.  HIP: Hamstrings - Short Sitting   Rest leg on raised surface. Keep knee straight. Lift chest. Hold _20__ seconds. _3__ reps per set, _2-3_ sets per day.  Copyright  VHI. All rights reserved.  Calf Stretch   Place one leg forward, bent, other leg behind and straight. Lean forward keeping back heel flat. Hold __20__ seconds while counting out loud. Repeat with other leg forward. Repeat __3__ times. Do 2-3____ sessions per day.  http://gt2.exer.us/478   Copyright  VHI. All rights reserved.

## 2014-07-24 NOTE — Therapy (Signed)
[facility] [address], [place], [place], [address] Phone: [phone]   Fax:  [phone]  Physical Therapy Evaluation  Patient Details  Name: [name] MRN: [id number] Date of Birth: [birth year] Referring Provider:  Leandrew Koyanagi, MD  Encounter Date: 07/24/2014      PT End of Session - 07/24/14 1603    Visit Number 1   Date for PT Re-Evaluation 09/18/14   PT Start Time 1324   PT Stop Time 1610   PT Time Calculation (min) 39 min   Activity Tolerance Patient tolerated treatment well   Behavior During Therapy [facility] for tasks assessed/performed      Past Medical History  Diagnosis Date  . Hyperlipidemia   . Gallstones   . Acid reflux   . Arthritis     right knee  . Hypertension     has added a second antihypertensive medication 06/2014  . Adrenal adenoma     is being monitored at [facility]  . Medial meniscus tear 06/2014    right knee  . Paraesophageal hernia     Past Surgical History  Procedure Laterality Date  . Total knee arthroplasty Left 04/08/2004  . Ganglion cyst excision Left 01/13/2002  . Colonoscopy N/A 05/29/2014    Procedure: COLONOSCOPY;  Surgeon: Daneil Dolin, MD;  [facility];  Service: Endoscopy;  Laterality: N/A;  215pm- Pt is working until 12:00 so she can't come any earlier  . Esophagogastroduodenoscopy N/A 05/29/2014    Procedure: ESOPHAGOGASTRODUODENOSCOPY (EGD);  Surgeon: Daneil Dolin, MD;  [facility];  Service: Endoscopy;  Laterality: N/A;  Venia Minks dilation N/A 05/29/2014    Procedure: Venia Minks DILATION;  Surgeon: Daneil Dolin, MD;  [facility];  Service: Endoscopy;  Laterality: N/A;  . Carpal tunnel release Left 01/06/2008  . Knee arthroscopy with medial menisectomy Right 06/28/2014    Procedure: RIGHT KNEE ARTHROSCOPY WITH PARTIAL MEDIAL MENISCECTOMY AND CHONDROPLASTY;  Surgeon: Marianna Payment, MD;  Location: [place];   Service: Orthopedics;  Laterality: Right;  . Chondroplasty Right 06/28/2014    Procedure: CHONDROPLASTY;  Surgeon: Marianna Payment, MD;  Location: [place];  Service: Orthopedics;  Laterality: Right;    There were no vitals filed for this visit.  Visit Diagnosis:  Knee pain, acute, right - Plan: PT plan of care cert/re-cert  Weakness of right lower extremity - Plan: PT plan of care cert/re-cert      Subjective Assessment - 07/24/14 1535    Symptoms Pt presents ~1 month s/p Rt knee arthroscopy due to pain and meniscus tear.  Pt reports that she will need to have a partial knee replacement on the Rt in the future.  Pt had TKA on the Lt in 2008.   Limitations Walking   How long can you walk comfortably? use of grocery cart x20-30 minutes   Diagnostic tests not after surgery   Patient Stated Goals reduce pain, standing and walking with increased comfort   Currently in Pain? Yes   Pain Score 5   4-9/10 over the past week   Pain Location Knee   Pain Orientation Right   Pain Descriptors / Indicators Other (Comment);Aching;Tightness  Hurting   Pain Type Surgical pain   Pain Onset 1 to 4 weeks ago   Pain Frequency Constant   Aggravating Factors  getting up and down at work, as the day progresses   Pain Relieving Factors ice, medication if  needed   Effect of Pain on Daily Activities limited in standing and walking   Multiple Pain Sites No            [facility] PT Assessment - 07/24/14 0001    Assessment   Medical Diagnosis s/p Rt knee arthroscopy   Onset Date 06/28/14   Next MD Visit 08/2014   Precautions   Precautions None   Restrictions   Weight Bearing Restrictions No   Balance Screen   Has the patient fallen in the past 6 months No   Has the patient had a decrease in activity level because of a fear of falling?  No   Is the patient reluctant to leave their home because of a fear of falling?  No   Home Environment   Living Enviornment Private residence    [place] to enter   Entrance Stairs-Number of Steps 6   Entrance Stairs-Rails Can reach both   [place] One level   Prior Function   Level of Independence Independent with basic ADLs   Vocation Full time employment   Vocation Requirements Pt works at [employer] and has to lift (1x/week), and can alternate between sitting and standing   Cognition   Overall Cognitive Status Within Functional Limits for tasks assessed   Observation/Other Assessments   Focus on Therapeutic Outcomes (FOTO)  47% limitation   ROM / Strength   AROM / PROM / Strength AROM;PROM;Strength   AROM   Overall AROM  Within functional limits for tasks performed   AROM Assessment Site Knee   Right/Left Knee Right   Right Knee Extension 0   Right Knee Flexion 126   PROM   Overall PROM  Within functional limits for tasks performed   PROM Assessment Site Hip;Knee   Strength   Overall Strength Deficits   Strength Assessment Site Knee;Hip   Right/Left Hip Right;Left   Right Hip Flexion 4+/5   Left Hip Flexion 4+/5   Right/Left Knee Right;Left   Right Knee Flexion 4+/5   Right Knee Extension 4/5   Left Knee Flexion 5/5   Left Knee Extension 4+/5   Palpation   Palpation No palpable edema or warmth today around the Rt knee.  Good patellar mobility in all directions.  Tender over Rt medial hamstring insertion   Ambulation/Gait   Ambulation/Gait Yes   Ambulation/Gait Assistance 7: Independent   Ambulation Distance (Feet) 50 Feet   Gait Pattern Decreased step length - right;Decreased step length - left;Decreased stance time - right   Ambulation Surface Level   Stairs Yes   Stairs Assistance --  step-to gait with ascending and descending   Stair Management Technique One rail Right   Number of Stairs 4   Height of Stairs 6                   [facility] Adult PT Treatment/Exercise - 07/24/14 0001    Exercises   Exercises Knee/Hip;Ankle   Knee/Hip Exercises: Stretches    Active Hamstring Stretch 3 reps;20 seconds  seated   Knee/Hip Exercises: Seated   Long Arc Quad Both;2 sets;10 reps;Strengthening   Knee/Hip Exercises: Supine   Straight Leg Raises Strengthening;Both;2 sets;10 reps                PT Education - 07/24/14 1557    Education provided Yes   Education Details HEP: hamstring, calf stretch, long arc quads, straight leg raise   Person(s) Educated Patient   Methods  Explanation;Demonstration;Tactile cues;Handout   Comprehension Verbalized understanding;Returned demonstration          PT Short Term Goals - 07/24/14 1605    PT SHORT TERM GOAL #1   Title be independent in initial HEP   Time 4   Period Weeks   Status New   PT SHORT TERM GOAL #2   Title stand at work with 30% less Rt knee pain   Time 4   Period Weeks   Status New           PT Long Term Goals - 07/24/14 1529    PT LONG TERM GOAL #1   Title be independent in a final HEP   Time 8   Status New   PT LONG TERM GOAL #2   Title reduce FOTO to < or = to 44% limitation   Time 8   Period Weeks   Status New   PT LONG TERM GOAL #3   Title stand at work with 60% less Rt knee pain   Time 8   Period Weeks   Status New   PT LONG TERM GOAL #4   Title demonstrate 5/5 Rt knee strength to improve endurance   Time 8   Period Weeks   Status New   PT LONG TERM GOAL #5   Title report a 50% reduction in Rt knee pain at the end of the day   Time [place] - 07/24/14 1603    Clinical Impression Statement Pt presents to PT s/p Rt knee scope.  Pt with pain, weakness and flexibility deficits s/p surgery.  Pt with gait abnormality that has been a chronic problem for her prior to surgery.   Pt will benefit from skilled therapeutic intervention in order to improve on the following deficits Abnormal gait;Difficulty walking;Decreased endurance;Decreased strength;Decreased activity tolerance   Rehab Potential Good   PT Frequency  2x / week   PT Duration 8 weeks   PT Treatment/Interventions ADLs/Self Care Home Management;Moist Heat;Patient/family education;Therapeutic activities;Therapeutic exercise;Manual techniques;Ultrasound;Cryotherapy;Neuromuscular re-education;Electrical Stimulation   PT Next Visit Plan Rt knee strength, gait, endurance, manual and modalities PRN   Consulted and Agree with Plan of Care Patient         Problem List Patient Active Problem List   Diagnosis Date Noted  . Acute medial meniscal injury of knee 06/14/2014  . Status post total knee replacement using cement 06/08/2014  . Primary osteoarthritis of right knee 06/08/2014  . Screening for colon cancer   . Schatzki's ring   . Hiatal hernia   . Dysphagia, pharyngoesophageal phase 05/26/2014  . Encounter for screening colonoscopy 05/26/2014  . Adrenal adenoma 05/01/2014  . Breast lump 05/01/2014  . Metatarsalgia of both feet 01/12/2014  . Bilateral leg pain 01/11/2014  . Bilateral edema of lower extremity 01/11/2014  . Other osteoarthritis of spine, thoracolumbar region 12/26/2013  . Hypercholesteremia 05/19/2013  . Periodic health assessment, general screening, adult 04/13/2013  . GERD (gastroesophageal reflux disease) 04/13/2013  . HTN (hypertension) 04/13/2013  . Dyspnea 04/13/2013    TAKACS,KELLY, PT 07/24/2014, 4:10 PM  [facility] [address], [place], [place], [address] Phone: [phone]   Fax:  [phone]

## 2014-07-26 ENCOUNTER — Encounter: Payer: Self-pay | Admitting: Physical Therapy

## 2014-07-26 ENCOUNTER — Ambulatory Visit: Payer: No Typology Code available for payment source | Admitting: Physical Therapy

## 2014-07-26 DIAGNOSIS — R29898 Other symptoms and signs involving the musculoskeletal system: Secondary | ICD-10-CM

## 2014-07-26 DIAGNOSIS — M25561 Pain in right knee: Secondary | ICD-10-CM

## 2014-07-26 NOTE — Therapy (Signed)
[facility] [address], [address] [place], [place], [address] Phone: [phone]   Fax:  [phone]  Physical Therapy Treatment  Patient Details  Name: [name] MRN: [id number] Date of Birth: [birth year] Referring Provider:  Tarry Kos, MD  Encounter Date: 07/26/2014      PT End of Session - 07/26/14 1611    Visit Number 2   Date for PT Re-Evaluation 09/18/14   PT Start Time 1530   PT Stop Time 1610   PT Time Calculation (min) 40 min   Activity Tolerance Patient tolerated treatment well   Behavior During Therapy [facility] for tasks assessed/performed      Past Medical History  Diagnosis Date  . Hyperlipidemia   . Gallstones   . Acid reflux   . Arthritis     right knee  . Hypertension     has added a second antihypertensive medication 06/2014  . Adrenal adenoma     is being monitored at [facility]  . Medial meniscus tear 06/2014    right knee  . Paraesophageal hernia     Past Surgical History  Procedure Laterality Date  . Total knee arthroplasty Left 04/08/2004  . Ganglion cyst excision Left 01/13/2002  . Colonoscopy N/A 05/29/2014    Procedure: COLONOSCOPY;  Surgeon: Corbin Ade, MD;  [facility];  Service: Endoscopy;  Laterality: N/A;  215pm- Pt is working until 12:00 so she can't come any earlier  . Esophagogastroduodenoscopy N/A 05/29/2014    Procedure: ESOPHAGOGASTRODUODENOSCOPY (EGD);  Surgeon: Corbin Ade, MD;  [facility];  Service: Endoscopy;  Laterality: N/A;  [name] dilation N/A 05/29/2014    Procedure: [name] DILATION;  Surgeon: Corbin Ade, MD;  [facility];  Service: Endoscopy;  Laterality: N/A;  . Carpal tunnel release Left 01/06/2008  . Knee arthroscopy with medial menisectomy Right 06/28/2014    Procedure: RIGHT KNEE ARTHROSCOPY WITH PARTIAL MEDIAL MENISCECTOMY AND CHONDROPLASTY;  Surgeon: Cheral Almas, MD;  Location: [facility];   Service: Orthopedics;  Laterality: Right;  . Chondroplasty Right 06/28/2014    Procedure: CHONDROPLASTY;  Surgeon: Cheral Almas, MD;  Location: [facility];  Service: Orthopedics;  Laterality: Right;    There were no vitals filed for this visit.  Visit Diagnosis:  Knee pain, acute, right  Weakness of right lower extremity      Subjective Assessment - 07/26/14 1540    Symptoms My right knee is feeling sore.  The weather is dropping and my knee hurts.    Limitations Walking   How long can you walk comfortably? use of grocery cart x20-30 minutes   Diagnostic tests not after surgery   Patient Stated Goals reduce pain, standing and walking with increased comfort   Currently in Pain? Yes   Pain Score 8    Pain Location Knee   Pain Orientation Right  behind knee   Pain Descriptors / Indicators Aching;Tightness   Pain Type Surgical pain   Pain Onset 1 to 4 weeks ago   Pain Frequency Constant   Aggravating Factors  getting up and down at work, towards the end of the day   Pain Relieving Factors ice, medication if needed   Effect of Pain on Daily Activities limited in standing and walking   Multiple Pain Sites No                       [facility] Adult  PT Treatment/Exercise - 07/26/14 0001    Knee/Hip Exercises: Standing   Terminal Knee Extension Strengthening;Right;20 reps;Theraband   Theraband Level (Terminal Knee Extension) --  red with tactile cues to contract gluteals   Knee/Hip Exercises: Seated   Other Seated Knee Exercises knee flexion theraband green 2x15   Knee/Hip Exercises: Supine   Heel Slides Right;20 reps  3 pounds   Heel Slides Limitations monitoring for pain   Knee Extension AROM;Right  3x10 1 pound   Manual Therapy   Manual Therapy Massage   Massage soft tissue work to medial right knee and post. med.    Ankle Exercises: Aerobic   Stationary Bike level 2 x 6 min.   Ankle Exercises: Standing   Rocker Board Other (comment)  PF  hold 10 sec to stretch gastroc then DF 10x   Rebounder mini trampoline 1 min. each way, no holding on                PT Education - 07/26/14 1611    Education provided No          PT Short Term Goals - 07/24/14 1605    PT SHORT TERM GOAL #1   Title be independent in initial HEP   Time 4   Period Weeks   Status New   PT SHORT TERM GOAL #2   Title stand at work with 30% less Rt knee pain   Time 4   Period Weeks   Status New           PT Long Term Goals - 07/24/14 1529    PT LONG TERM GOAL #1   Title be independent in a final HEP   Time 8   Status New   PT LONG TERM GOAL #2   Title reduce FOTO to < or = to 44% limitation   Time 8   Period Weeks   Status New   PT LONG TERM GOAL #3   Title stand at work with 60% less Rt knee pain   Time 8   Period Weeks   Status New   PT LONG TERM GOAL #4   Title demonstrate 5/5 Rt knee strength to improve endurance   Time 8   Period Weeks   Status New   PT LONG TERM GOAL #5   Title report a 50% reduction in Rt knee pain at the end of the day   Time 8   Period Weeks   Status New               Plan - 07/26/14 1612    Clinical Impression Statement Patient had increased soreness with standing exercises. Patient had increased thickness on medial right knee.    Pt will benefit from skilled therapeutic intervention in order to improve on the following deficits Abnormal gait;Difficulty walking;Decreased endurance;Decreased strength;Decreased activity tolerance;Increased muscle spasms;Pain   Rehab Potential Good   PT Frequency 2x / week   PT Duration 2 weeks   PT Treatment/Interventions ADLs/Self Care Home Management;Moist Heat;Patient/family education;Therapeutic activities;Therapeutic exercise;Manual techniques;Ultrasound;Cryotherapy;Neuromuscular re-education;Electrical Stimulation   PT Next Visit Plan ultrasound to right knee medially.   PT Home Exercise Plan knee extension ex and flexion with theraband    Consulted and Agree with Plan of Care Patient        Problem List Patient Active Problem List   Diagnosis Date Noted  . Acute medial meniscal injury of knee 06/14/2014  . Status post total knee replacement using cement 06/08/2014  . Primary osteoarthritis of  right knee 06/08/2014  . Screening for colon cancer   . Schatzki's ring   . Hiatal hernia   . Dysphagia, pharyngoesophageal phase 05/26/2014  . Encounter for screening colonoscopy 05/26/2014  . Adrenal adenoma 05/01/2014  . Breast lump 05/01/2014  . Metatarsalgia of both feet 01/12/2014  . Bilateral leg pain 01/11/2014  . Bilateral edema of lower extremity 01/11/2014  . Other osteoarthritis of spine, thoracolumbar region 12/26/2013  . Hypercholesteremia 05/19/2013  . Periodic health assessment, general screening, adult 04/13/2013  . GERD (gastroesophageal reflux disease) 04/13/2013  . HTN (hypertension) 04/13/2013  . Dyspnea 04/13/2013    Azariel Banik,PT 07/26/2014, 4:14 PM  [facility] [address], [address] [place], [place], [address] Phone: [phone]   Fax:  [phone]

## 2014-07-31 ENCOUNTER — Ambulatory Visit: Payer: No Typology Code available for payment source

## 2014-07-31 DIAGNOSIS — R29898 Other symptoms and signs involving the musculoskeletal system: Secondary | ICD-10-CM

## 2014-07-31 DIAGNOSIS — M25561 Pain in right knee: Secondary | ICD-10-CM

## 2014-07-31 NOTE — Therapy (Signed)
[facility] [address], [place], [place], [address] Phone: [phone]   Fax:  [phone]  Physical Therapy Treatment  Patient Details  Name: [name] MRN: [id number] Date of Birth: 1960/07/21 Referring Provider:  Leandrew Koyanagi, MD  Encounter Date: 07/31/2014      PT End of Session - 07/31/14 1646    Visit Number 3   Date for PT Re-Evaluation 09/18/14   PT Start Time 6834   PT Stop Time 1700   PT Time Calculation (min) 45 min   Activity Tolerance Patient tolerated treatment well   Behavior During Therapy [facility] for tasks assessed/performed      Past Medical History  Diagnosis Date  . Hyperlipidemia   . Gallstones   . Acid reflux   . Arthritis     right knee  . Hypertension     has added a second antihypertensive medication 06/2014  . Adrenal adenoma     is being monitored at [facility]  . Medial meniscus tear 06/2014    right knee  . Paraesophageal hernia     Past Surgical History  Procedure Laterality Date  . Total knee arthroplasty Left 04/08/2004  . Ganglion cyst excision Left 01/13/2002  . Colonoscopy N/A 05/29/2014    Procedure: COLONOSCOPY;  Surgeon: Daneil Dolin, MD;  [facility];  Service: Endoscopy;  Laterality: N/A;  215pm- Pt is working until 12:00 so she can't come any earlier  . Esophagogastroduodenoscopy N/A 05/29/2014    Procedure: ESOPHAGOGASTRODUODENOSCOPY (EGD);  Surgeon: Daneil Dolin, MD;  [facility];  Service: Endoscopy;  Laterality: N/A;  Venia Minks dilation N/A 05/29/2014    Procedure: Venia Minks DILATION;  Surgeon: Daneil Dolin, MD;  [facility];  Service: Endoscopy;  Laterality: N/A;  . Carpal tunnel release Left 01/06/2008  . Knee arthroscopy with medial menisectomy Right 06/28/2014    Procedure: RIGHT KNEE ARTHROSCOPY WITH PARTIAL MEDIAL MENISCECTOMY AND CHONDROPLASTY;  Surgeon: Marianna Payment, MD;  Location: [place];   Service: Orthopedics;  Laterality: Right;  . Chondroplasty Right 06/28/2014    Procedure: CHONDROPLASTY;  Surgeon: Marianna Payment, MD;  Location: [place];  Service: Orthopedics;  Laterality: Right;    There were no vitals filed for this visit.  Visit Diagnosis:  Knee pain, acute, right  Weakness of right lower extremity      Subjective Assessment - 07/31/14 1619    Symptoms Rt knee was very painful after last session.     Currently in Pain? Yes   Pain Score 7    Pain Location Knee   Pain Orientation Right   Pain Descriptors / Indicators Tightness;Aching   Pain Type Surgical pain   Pain Onset 1 to 4 weeks ago   Pain Frequency Constant   Aggravating Factors  walking, end of the day   Pain Relieving Factors ice, pain medication, rest   Multiple Pain Sites No                       [facility] Adult PT Treatment/Exercise - 07/31/14 0001    Modalities   Modalities Cryotherapy;Ultrasound;Electrical Stimulation   Cryotherapy   Number Minutes Cryotherapy 15 Minutes   Cryotherapy Location Knee  Right   Type of Cryotherapy Ice pack   Electrical Stimulation   Electrical Stimulation Location Rt knee   Electrical Stimulation Action IFC   Electrical Stimulation Parameters 15 minutes   Electrical Stimulation Goals  [facility] [address], [place], [place], [address] Phone: [phone]   Fax:  [phone]  Physical Therapy Treatment  Patient Details  Name: [name] MRN: [id number] Date of Birth: 1960/07/21 Referring Provider:  Leandrew Koyanagi, MD  Encounter Date: 07/31/2014      PT End of Session - 07/31/14 1646    Visit Number 3   Date for PT Re-Evaluation 09/18/14   PT Start Time 6834   PT Stop Time 1700   PT Time Calculation (min) 45 min   Activity Tolerance Patient tolerated treatment well   Behavior During Therapy [facility] for tasks assessed/performed      Past Medical History  Diagnosis Date  . Hyperlipidemia   . Gallstones   . Acid reflux   . Arthritis     right knee  . Hypertension     has added a second antihypertensive medication 06/2014  . Adrenal adenoma     is being monitored at [facility]  . Medial meniscus tear 06/2014    right knee  . Paraesophageal hernia     Past Surgical History  Procedure Laterality Date  . Total knee arthroplasty Left 04/08/2004  . Ganglion cyst excision Left 01/13/2002  . Colonoscopy N/A 05/29/2014    Procedure: COLONOSCOPY;  Surgeon: Daneil Dolin, MD;  [facility];  Service: Endoscopy;  Laterality: N/A;  215pm- Pt is working until 12:00 so she can't come any earlier  . Esophagogastroduodenoscopy N/A 05/29/2014    Procedure: ESOPHAGOGASTRODUODENOSCOPY (EGD);  Surgeon: Daneil Dolin, MD;  [facility];  Service: Endoscopy;  Laterality: N/A;  Venia Minks dilation N/A 05/29/2014    Procedure: Venia Minks DILATION;  Surgeon: Daneil Dolin, MD;  [facility];  Service: Endoscopy;  Laterality: N/A;  . Carpal tunnel release Left 01/06/2008  . Knee arthroscopy with medial menisectomy Right 06/28/2014    Procedure: RIGHT KNEE ARTHROSCOPY WITH PARTIAL MEDIAL MENISCECTOMY AND CHONDROPLASTY;  Surgeon: Marianna Payment, MD;  Location: [place];   Service: Orthopedics;  Laterality: Right;  . Chondroplasty Right 06/28/2014    Procedure: CHONDROPLASTY;  Surgeon: Marianna Payment, MD;  Location: [place];  Service: Orthopedics;  Laterality: Right;    There were no vitals filed for this visit.  Visit Diagnosis:  Knee pain, acute, right  Weakness of right lower extremity      Subjective Assessment - 07/31/14 1619    Symptoms Rt knee was very painful after last session.     Currently in Pain? Yes   Pain Score 7    Pain Location Knee   Pain Orientation Right   Pain Descriptors / Indicators Tightness;Aching   Pain Type Surgical pain   Pain Onset 1 to 4 weeks ago   Pain Frequency Constant   Aggravating Factors  walking, end of the day   Pain Relieving Factors ice, pain medication, rest   Multiple Pain Sites No                       [facility] Adult PT Treatment/Exercise - 07/31/14 0001    Modalities   Modalities Cryotherapy;Ultrasound;Electrical Stimulation   Cryotherapy   Number Minutes Cryotherapy 15 Minutes   Cryotherapy Location Knee  Right   Type of Cryotherapy Ice pack   Electrical Stimulation   Electrical Stimulation Location Rt knee   Electrical Stimulation Action IFC   Electrical Stimulation Parameters 15 minutes   Electrical Stimulation Goals  [facility] [address], [place], [place], [address] Phone: [phone]   Fax:  [phone]  Physical Therapy Treatment  Patient Details  Name: [name] MRN: [id number] Date of Birth: 1960/07/21 Referring Provider:  Leandrew Koyanagi, MD  Encounter Date: 07/31/2014      PT End of Session - 07/31/14 1646    Visit Number 3   Date for PT Re-Evaluation 09/18/14   PT Start Time 6834   PT Stop Time 1700   PT Time Calculation (min) 45 min   Activity Tolerance Patient tolerated treatment well   Behavior During Therapy [facility] for tasks assessed/performed      Past Medical History  Diagnosis Date  . Hyperlipidemia   . Gallstones   . Acid reflux   . Arthritis     right knee  . Hypertension     has added a second antihypertensive medication 06/2014  . Adrenal adenoma     is being monitored at [facility]  . Medial meniscus tear 06/2014    right knee  . Paraesophageal hernia     Past Surgical History  Procedure Laterality Date  . Total knee arthroplasty Left 04/08/2004  . Ganglion cyst excision Left 01/13/2002  . Colonoscopy N/A 05/29/2014    Procedure: COLONOSCOPY;  Surgeon: Daneil Dolin, MD;  [facility];  Service: Endoscopy;  Laterality: N/A;  215pm- Pt is working until 12:00 so she can't come any earlier  . Esophagogastroduodenoscopy N/A 05/29/2014    Procedure: ESOPHAGOGASTRODUODENOSCOPY (EGD);  Surgeon: Daneil Dolin, MD;  [facility];  Service: Endoscopy;  Laterality: N/A;  Venia Minks dilation N/A 05/29/2014    Procedure: Venia Minks DILATION;  Surgeon: Daneil Dolin, MD;  [facility];  Service: Endoscopy;  Laterality: N/A;  . Carpal tunnel release Left 01/06/2008  . Knee arthroscopy with medial menisectomy Right 06/28/2014    Procedure: RIGHT KNEE ARTHROSCOPY WITH PARTIAL MEDIAL MENISCECTOMY AND CHONDROPLASTY;  Surgeon: Marianna Payment, MD;  Location: [place];   Service: Orthopedics;  Laterality: Right;  . Chondroplasty Right 06/28/2014    Procedure: CHONDROPLASTY;  Surgeon: Marianna Payment, MD;  Location: [place];  Service: Orthopedics;  Laterality: Right;    There were no vitals filed for this visit.  Visit Diagnosis:  Knee pain, acute, right  Weakness of right lower extremity      Subjective Assessment - 07/31/14 1619    Symptoms Rt knee was very painful after last session.     Currently in Pain? Yes   Pain Score 7    Pain Location Knee   Pain Orientation Right   Pain Descriptors / Indicators Tightness;Aching   Pain Type Surgical pain   Pain Onset 1 to 4 weeks ago   Pain Frequency Constant   Aggravating Factors  walking, end of the day   Pain Relieving Factors ice, pain medication, rest   Multiple Pain Sites No                       [facility] Adult PT Treatment/Exercise - 07/31/14 0001    Modalities   Modalities Cryotherapy;Ultrasound;Electrical Stimulation   Cryotherapy   Number Minutes Cryotherapy 15 Minutes   Cryotherapy Location Knee  Right   Type of Cryotherapy Ice pack   Electrical Stimulation   Electrical Stimulation Location Rt knee   Electrical Stimulation Action IFC   Electrical Stimulation Parameters 15 minutes   Electrical Stimulation Goals

## 2014-08-02 ENCOUNTER — Ambulatory Visit: Payer: No Typology Code available for payment source | Admitting: Physical Therapy

## 2014-08-07 ENCOUNTER — Encounter: Payer: Self-pay | Admitting: Physical Therapy

## 2014-08-07 ENCOUNTER — Telehealth: Payer: Self-pay | Admitting: Internal Medicine

## 2014-08-07 ENCOUNTER — Ambulatory Visit: Payer: No Typology Code available for payment source | Admitting: Physical Therapy

## 2014-08-07 DIAGNOSIS — R29898 Other symptoms and signs involving the musculoskeletal system: Secondary | ICD-10-CM

## 2014-08-07 DIAGNOSIS — M25561 Pain in right knee: Secondary | ICD-10-CM

## 2014-08-07 DIAGNOSIS — R609 Edema, unspecified: Secondary | ICD-10-CM

## 2014-08-07 NOTE — Telephone Encounter (Signed)
Is at work until 2 today Pt wants to make another complaint about [facility] not returning her phone calls

## 2014-08-07 NOTE — Therapy (Addendum)
[facility] [address], [place], [place], [address] Phone: [phone]   Fax:  [phone]  Physical Therapy Treatment  Patient Details  Name: [name] MRN: [id number] Date of Birth: Nov 21, 1960 Referring Provider:  Lorayne Marek, MD  Encounter Date: 08/07/2014      PT End of Session - 08/07/14 1606    Visit Number 4   Date for PT Re-Evaluation 09/18/14   PT Start Time 0737   PT Stop Time 1625   PT Time Calculation (min) 55 min   Activity Tolerance Patient tolerated treatment well   Behavior During Therapy [facility] for tasks assessed/performed      Past Medical History  Diagnosis Date  . Hyperlipidemia   . Gallstones   . Acid reflux   . Arthritis     right knee  . Hypertension     has added a second antihypertensive medication 06/2014  . Adrenal adenoma     is being monitored at [facility]  . Medial meniscus tear 06/2014    right knee  . Paraesophageal hernia     Past Surgical History  Procedure Laterality Date  . Total knee arthroplasty Left 04/08/2004  . Ganglion cyst excision Left 01/13/2002  . Colonoscopy N/A 05/29/2014    Procedure: COLONOSCOPY;  Surgeon: Daneil Dolin, MD;  [facility];  Service: Endoscopy;  Laterality: N/A;  215pm- Pt is working until 12:00 so she can't come any earlier  . Esophagogastroduodenoscopy N/A 05/29/2014    Procedure: ESOPHAGOGASTRODUODENOSCOPY (EGD);  Surgeon: Daneil Dolin, MD;  [facility];  Service: Endoscopy;  Laterality: N/A;  Venia Minks dilation N/A 05/29/2014    Procedure: Venia Minks DILATION;  Surgeon: Daneil Dolin, MD;  [facility];  Service: Endoscopy;  Laterality: N/A;  . Carpal tunnel release Left 01/06/2008  . Knee arthroscopy with medial menisectomy Right 06/28/2014    Procedure: RIGHT KNEE ARTHROSCOPY WITH PARTIAL MEDIAL MENISCECTOMY AND CHONDROPLASTY;  Surgeon: Marianna Payment, MD;  Location: [place];   Service: Orthopedics;  Laterality: Right;  . Chondroplasty Right 06/28/2014    Procedure: CHONDROPLASTY;  Surgeon: Marianna Payment, MD;  Location: [place];  Service: Orthopedics;  Laterality: Right;    There were no vitals filed for this visit.  Visit Diagnosis:  Knee pain, acute, right  Weakness of right lower extremity  Edema      Subjective Assessment - 08/07/14 1530    Symptoms Better than last week. End of the day rough.   Currently in Pain? Yes   Pain Score 4    Pain Location Knee   Pain Orientation Right   Pain Descriptors / Indicators Aching;Constant   Pain Type Surgical pain   Aggravating Factors  walking, end of day   Pain Relieving Factors ice, meds, rest   Multiple Pain Sites No            [facility] PT Assessment - 08/07/14 0001    Strength   Right Knee Extension 4+/5   Left Knee Flexion 5/5                   [facility] Adult PT Treatment/Exercise - 08/07/14 0001    Knee/Hip Exercises: Aerobic   Stationary Bike L2 x 10 with status review   Knee/Hip Exercises: Standing   Rebounder weight shifting 3 ways 1 min each   Cryotherapy   Number Minutes Cryotherapy 15 Minutes   Cryotherapy Location Knee  Right  [facility] [address], [place], [place], [address] Phone: [phone]   Fax:  [phone]  Physical Therapy Treatment  Patient Details  Name: [name] MRN: [id number] Date of Birth: Nov 21, 1960 Referring Provider:  Lorayne Marek, MD  Encounter Date: 08/07/2014      PT End of Session - 08/07/14 1606    Visit Number 4   Date for PT Re-Evaluation 09/18/14   PT Start Time 0737   PT Stop Time 1625   PT Time Calculation (min) 55 min   Activity Tolerance Patient tolerated treatment well   Behavior During Therapy [facility] for tasks assessed/performed      Past Medical History  Diagnosis Date  . Hyperlipidemia   . Gallstones   . Acid reflux   . Arthritis     right knee  . Hypertension     has added a second antihypertensive medication 06/2014  . Adrenal adenoma     is being monitored at [facility]  . Medial meniscus tear 06/2014    right knee  . Paraesophageal hernia     Past Surgical History  Procedure Laterality Date  . Total knee arthroplasty Left 04/08/2004  . Ganglion cyst excision Left 01/13/2002  . Colonoscopy N/A 05/29/2014    Procedure: COLONOSCOPY;  Surgeon: Daneil Dolin, MD;  [facility];  Service: Endoscopy;  Laterality: N/A;  215pm- Pt is working until 12:00 so she can't come any earlier  . Esophagogastroduodenoscopy N/A 05/29/2014    Procedure: ESOPHAGOGASTRODUODENOSCOPY (EGD);  Surgeon: Daneil Dolin, MD;  [facility];  Service: Endoscopy;  Laterality: N/A;  Venia Minks dilation N/A 05/29/2014    Procedure: Venia Minks DILATION;  Surgeon: Daneil Dolin, MD;  [facility];  Service: Endoscopy;  Laterality: N/A;  . Carpal tunnel release Left 01/06/2008  . Knee arthroscopy with medial menisectomy Right 06/28/2014    Procedure: RIGHT KNEE ARTHROSCOPY WITH PARTIAL MEDIAL MENISCECTOMY AND CHONDROPLASTY;  Surgeon: Marianna Payment, MD;  Location: [place];   Service: Orthopedics;  Laterality: Right;  . Chondroplasty Right 06/28/2014    Procedure: CHONDROPLASTY;  Surgeon: Marianna Payment, MD;  Location: [place];  Service: Orthopedics;  Laterality: Right;    There were no vitals filed for this visit.  Visit Diagnosis:  Knee pain, acute, right  Weakness of right lower extremity  Edema      Subjective Assessment - 08/07/14 1530    Symptoms Better than last week. End of the day rough.   Currently in Pain? Yes   Pain Score 4    Pain Location Knee   Pain Orientation Right   Pain Descriptors / Indicators Aching;Constant   Pain Type Surgical pain   Aggravating Factors  walking, end of day   Pain Relieving Factors ice, meds, rest   Multiple Pain Sites No            [facility] PT Assessment - 08/07/14 0001    Strength   Right Knee Extension 4+/5   Left Knee Flexion 5/5                   [facility] Adult PT Treatment/Exercise - 08/07/14 0001    Knee/Hip Exercises: Aerobic   Stationary Bike L2 x 10 with status review   Knee/Hip Exercises: Standing   Rebounder weight shifting 3 ways 1 min each   Cryotherapy   Number Minutes Cryotherapy 15 Minutes   Cryotherapy Location Knee  Right  to discharge.  Patient goals were partially met. Patient is being discharged due to the patient's request.  ?????   Lorrene Reid, PT 08/14/2014 11:37 AM  [facility] [address], [address] [place], [place], [address] Phone: [phone]   Fax:  [phone]

## 2014-08-09 ENCOUNTER — Encounter: Payer: No Typology Code available for payment source | Admitting: Physical Therapy

## 2014-08-09 NOTE — Telephone Encounter (Signed)
[name] can you call the patient and find out what is her concern.

## 2014-08-09 NOTE — Telephone Encounter (Signed)
Pt is calling again and about not getting a return call back from [facility]. Pt would like to speak to [employer]. jw

## 2014-08-10 NOTE — Telephone Encounter (Signed)
Returned patient's call.  She was seen at [facility] for a GI referral and it was determined that she needed surgery for a Adrenal Adenoma.  Due to patient's insurance they stated that they could not see her and suggested she contact [facility].  They ([facility]) are willing to perform the surgery but require $225 up front.  Patient is willing to pay this and now needs a referral from Dr. Annitta Needs to [facility] for this procedure.  Patient was angry that she has made 6 phone calls to the clinic over the past few weeks and cannot get anyone to call her back.  I apologized for her frustration and assured her that I would take care of this and that she would receive confirmation that it has been taken care of.

## 2014-08-11 NOTE — Telephone Encounter (Signed)
I spoke to [name] she knows that I refer [facility] to [facility] and now they said that she need a surgery  [facility] I called and I spoke to Nibbe she schedule her an appointment for Monday 08-28-14 @3 :30 pm with Dr Rosendo Gros she is agree to pay the $226 for consultation but no her surgery I told [name] that [facility] is not [facility] of [facility] and she will need to pay 50% before the surgery and 50% after ans she said that she can't and she will call [name] .and patient wants me to canceled her appt with [facility].

## 2014-08-14 ENCOUNTER — Encounter: Payer: Self-pay | Admitting: Internal Medicine

## 2014-08-14 ENCOUNTER — Ambulatory Visit: Payer: No Typology Code available for payment source | Attending: Internal Medicine | Admitting: Internal Medicine

## 2014-08-14 ENCOUNTER — Encounter: Payer: No Typology Code available for payment source | Admitting: Physical Therapy

## 2014-08-14 VITALS — BP 120/90 | HR 96 | Temp 98.0°F | Resp 16 | Wt 205.0 lb

## 2014-08-14 DIAGNOSIS — K219 Gastro-esophageal reflux disease without esophagitis: Secondary | ICD-10-CM | POA: Insufficient documentation

## 2014-08-14 DIAGNOSIS — D3500 Benign neoplasm of unspecified adrenal gland: Secondary | ICD-10-CM | POA: Insufficient documentation

## 2014-08-14 DIAGNOSIS — Z7982 Long term (current) use of aspirin: Secondary | ICD-10-CM | POA: Insufficient documentation

## 2014-08-14 DIAGNOSIS — R232 Flushing: Secondary | ICD-10-CM

## 2014-08-14 DIAGNOSIS — E785 Hyperlipidemia, unspecified: Secondary | ICD-10-CM | POA: Insufficient documentation

## 2014-08-14 DIAGNOSIS — N951 Menopausal and female climacteric states: Secondary | ICD-10-CM

## 2014-08-14 DIAGNOSIS — Z733 Stress, not elsewhere classified: Secondary | ICD-10-CM | POA: Insufficient documentation

## 2014-08-14 DIAGNOSIS — Z96652 Presence of left artificial knee joint: Secondary | ICD-10-CM | POA: Insufficient documentation

## 2014-08-14 DIAGNOSIS — I10 Essential (primary) hypertension: Secondary | ICD-10-CM

## 2014-08-14 DIAGNOSIS — M25511 Pain in right shoulder: Secondary | ICD-10-CM

## 2014-08-14 DIAGNOSIS — Z79899 Other long term (current) drug therapy: Secondary | ICD-10-CM | POA: Insufficient documentation

## 2014-08-14 DIAGNOSIS — M1711 Unilateral primary osteoarthritis, right knee: Secondary | ICD-10-CM | POA: Insufficient documentation

## 2014-08-14 MED ORDER — CITALOPRAM HYDROBROMIDE 10 MG PO TABS: 10.0000 mg | ORAL_TABLET | Freq: Every day | ORAL | Status: DC

## 2014-08-14 NOTE — Progress Notes (Signed)
MRN: [id number] Name: [name]  Sex: female Age: 54 y.o. DOB: Mar 06, 1961  Allergies: Review of patient's allergies indicates no known allergies.  Chief Complaint  Patient presents with  . Follow-up    HPI: Patient is 54 y.o. female who has history of hypertension, currently she is taking lisinopril 40 mgas well as hydrochlorothiazide, as per patient over the weekend blood pressure was elevated to systolic around 956O and diastolic around 13Y, she also reported to have history of hot flushes, does report being under a lot of stress,patient denies any SI or HI, patient never tried SSRIs, currently denies any headache dizziness chest and shortness of breath, she reported to have some right arm pain when she woke up and has taken ibuprofen which is slightly improved, denies any recent fall or trauma.  Past Medical History  Diagnosis Date  . Hyperlipidemia   . Gallstones   . Acid reflux   . Arthritis     right knee  . Hypertension     has added a second antihypertensive medication 06/2014  . Adrenal adenoma     is being monitored at [facility]  . Medial meniscus tear 06/2014    right knee  . Paraesophageal hernia     Past Surgical History  Procedure Laterality Date  . Total knee arthroplasty Left 04/08/2004  . Ganglion cyst excision Left 01/13/2002  . Colonoscopy N/A 05/29/2014    Procedure: COLONOSCOPY;  Surgeon: Daneil Dolin, MD;  [facility];  Service: Endoscopy;  Laterality: N/A;  215pm- Pt is working until 12:00 so she can't come any earlier  . Esophagogastroduodenoscopy N/A 05/29/2014    Procedure: ESOPHAGOGASTRODUODENOSCOPY (EGD);  Surgeon: Daneil Dolin, MD;  [facility];  Service: Endoscopy;  Laterality: N/A;  Venia Minks dilation N/A 05/29/2014    Procedure: Venia Minks DILATION;  Surgeon: Daneil Dolin, MD;  [facility];  Service: Endoscopy;  Laterality: N/A;  . Carpal tunnel release Left 01/06/2008  . Knee arthroscopy with medial  menisectomy Right 06/28/2014    Procedure: RIGHT KNEE ARTHROSCOPY WITH PARTIAL MEDIAL MENISCECTOMY AND CHONDROPLASTY;  Surgeon: Marianna Payment, MD;  Location: [place];  Service: Orthopedics;  Laterality: Right;  . Chondroplasty Right 06/28/2014    Procedure: CHONDROPLASTY;  Surgeon: Marianna Payment, MD;  Location: [place];  Service: Orthopedics;  Laterality: Right;      Medication List       This list is accurate as of: 08/14/14  4:37 PM.  Always use your most recent med list.               aspirin EC 325 MG tablet  Take 1 tablet (325 mg total) by mouth 2 (two) times daily.     atorvastatin 10 MG tablet  Commonly known as:  LIPITOR  TAKE 1 TABLET BY MOUTH DAILY AT 6 PM.     citalopram 10 MG tablet  Commonly known as:  CELEXA  Take 1 tablet (10 mg total) by mouth daily.     hydrochlorothiazide 25 MG tablet  Commonly known as:  HYDRODIURIL  Take 1 tablet (25 mg total) by mouth daily.     lisinopril 40 MG tablet  Commonly known as:  PRINIVIL,ZESTRIL  Take 1 tablet (40 mg total) by mouth daily.     omeprazole 40 MG capsule  Commonly known as:  PRILOSEC  Take 40 mg by mouth 2 (two) times daily.     oxyCODONE 5 MG immediate  release tablet  Commonly known as:  Oxy IR/ROXICODONE  Take 1-3 tablets (5-15 mg total) by mouth every 4 (four) hours as needed.     ranitidine 150 MG tablet  Commonly known as:  ZANTAC  Take 150 mg by mouth 2 (two) times daily.     traMADol 50 MG tablet  Commonly known as:  ULTRAM  Take 1 tablet (50 mg total) by mouth every 6 (six) hours as needed.        Meds ordered this encounter  Medications  . citalopram (CELEXA) 10 MG tablet    Sig: Take 1 tablet (10 mg total) by mouth daily.    Dispense:  30 tablet    Refill:  3    Immunization History  Administered Date(s) Administered  . Tdap 04/29/2013    Family History  Problem Relation Age of Onset  . Cancer Father     lung  . Breast cancer Sister    . Cancer Maternal Grandfather     lung  . Cancer Paternal Grandfather     lung  . Colon cancer Neg Hx     History  Substance Use Topics  . Smoking status: Never Smoker   . Smokeless tobacco: Never Used  . Alcohol Use: No    Review of Systems   As noted in HPI  Filed Vitals:   08/14/14 1546  BP: 120/90  Pulse:   Temp:   Resp:     Physical Exam  Physical Exam  Constitutional: No distress.  Eyes: EOM are normal. Pupils are equal, round, and reactive to light.  Cardiovascular: Normal rate and regular rhythm.   Pulmonary/Chest: Breath sounds normal. No respiratory distress. She has no wheezes. She has no rales.  Musculoskeletal:  Right shoulder no tenderness full range of motion    CBC    Component Value Date/Time   WBC 7.0 04/13/2013 1242   RBC 4.33 04/13/2013 1242   HGB 12.9 06/28/2014 0744   HCT 38.9 04/13/2013 1242   PLT 323 04/13/2013 1242   MCV 89.8 04/13/2013 1242   LYMPHSABS 2.6 04/13/2013 1242   MONOABS 0.8 04/13/2013 1242   EOSABS 0.1 04/13/2013 1242   BASOSABS 0.0 04/13/2013 1242    CMP     Component Value Date/Time   NA 138 07/05/2014 1138   K 4.7 07/05/2014 1138   CL 100 07/05/2014 1138   CO2 30 07/05/2014 1138   GLUCOSE 86 07/05/2014 1138   BUN 14 07/05/2014 1138   CREATININE 0.96 07/05/2014 1138   CREATININE 0.90 06/23/2014 1600   CALCIUM 9.5 07/05/2014 1138   PROT 7.0 07/05/2014 1138   ALBUMIN 4.2 07/05/2014 1138   AST 16 07/05/2014 1138   ALT 18 07/05/2014 1138   ALKPHOS 99 07/05/2014 1138   BILITOT 0.4 07/05/2014 1138   GFRNONAA 68 07/05/2014 1138   GFRNONAA 72* 06/23/2014 1600   GFRAA 79 07/05/2014 1138   GFRAA 84* 06/23/2014 1600    Lab Results  Component Value Date/Time   CHOL 252* 04/13/2013 12:42 PM    No components found for: HGA1C  Lab Results  Component Value Date/Time   AST 16 07/05/2014 11:38 AM    Assessment and Plan  Essential hypertension Manual blood pressure is  756/43, diastolic pressure is  borderline elevated, have advised patient for DASH diet, continue with lisinopril/hydrochlorthiazide. Will check blood chemistry on the next visit.  Hot flashes/stress  - Plan:trial of SSRI , citalopram (CELEXA) 10 MG tablet  Right shoulder pain Tylenol/ibuprofen when necessary  Return in about 3 months (around 11/13/2014), or if symptoms worsen or fail to improve.   This note has been created with Surveyor, quantity. Any transcriptional errors are unintentional.    Lorayne Marek, MD

## 2014-08-14 NOTE — Patient Instructions (Signed)
DASH Eating Plan °DASH stands for "Dietary Approaches to Stop Hypertension." The DASH eating plan is a healthy eating plan that has been shown to reduce high blood pressure (hypertension). Additional health benefits may include reducing the risk of type 2 diabetes mellitus, heart disease, and stroke. The DASH eating plan may also help with weight loss. °WHAT DO I NEED TO KNOW ABOUT THE DASH EATING PLAN? °For the DASH eating plan, you will follow these general guidelines: °· Choose foods with a percent daily value for sodium of less than 5% (as listed on the food label). °· Use salt-free seasonings or herbs instead of table salt or sea salt. °· Check with your health care provider or pharmacist before using salt substitutes. °· Eat lower-sodium products, often labeled as "lower sodium" or "no salt added." °· Eat fresh foods. °· Eat more vegetables, fruits, and low-fat dairy products. °· Choose whole grains. Look for the word "whole" as the first word in the ingredient list. °· Choose fish and skinless chicken or turkey more often than red meat. Limit fish, poultry, and meat to 6 oz (170 g) each day. °· Limit sweets, desserts, sugars, and sugary drinks. °· Choose heart-healthy fats. °· Limit cheese to 1 oz (28 g) per day. °· Eat more home-cooked food and less restaurant, buffet, and fast food. °· Limit fried foods. °· Cook foods using methods other than frying. °· Limit canned vegetables. If you do use them, rinse them well to decrease the sodium. °· When eating at a restaurant, ask that your food be prepared with less salt, or no salt if possible. °WHAT FOODS CAN I EAT? °Seek help from a dietitian for individual calorie needs. °Grains °Whole grain or whole wheat bread. Brown rice. Whole grain or whole wheat pasta. Quinoa, bulgur, and whole grain cereals. Low-sodium cereals. Corn or whole wheat flour tortillas. Whole grain cornbread. Whole grain crackers. Low-sodium crackers. °Vegetables °Fresh or frozen vegetables  (raw, steamed, roasted, or grilled). Low-sodium or reduced-sodium tomato and vegetable juices. Low-sodium or reduced-sodium tomato sauce and paste. Low-sodium or reduced-sodium canned vegetables.  °Fruits °All fresh, canned (in natural juice), or frozen fruits. °Meat and Other Protein Products °Ground beef (85% or leaner), grass-fed beef, or beef trimmed of fat. Skinless chicken or turkey. Ground chicken or turkey. Pork trimmed of fat. All fish and seafood. Eggs. Dried beans, peas, or lentils. Unsalted nuts and seeds. Unsalted canned beans. °Dairy °Low-fat dairy products, such as skim or 1% milk, 2% or reduced-fat cheeses, low-fat ricotta or cottage cheese, or plain low-fat yogurt. Low-sodium or reduced-sodium cheeses. °Fats and Oils °Tub margarines without trans fats. Light or reduced-fat mayonnaise and salad dressings (reduced sodium). Avocado. Safflower, olive, or canola oils. Natural peanut or almond butter. °Other °Unsalted popcorn and pretzels. °The items listed above may not be a complete list of recommended foods or beverages. Contact your dietitian for more options. °WHAT FOODS ARE NOT RECOMMENDED? °Grains °White bread. White pasta. White rice. Refined cornbread. Bagels and croissants. Crackers that contain trans fat. °Vegetables °Creamed or fried vegetables. Vegetables in a cheese sauce. Regular canned vegetables. Regular canned tomato sauce and paste. Regular tomato and vegetable juices. °Fruits °Dried fruits. Canned fruit in light or heavy syrup. Fruit juice. °Meat and Other Protein Products °Fatty cuts of meat. Ribs, chicken wings, bacon, sausage, bologna, salami, chitterlings, fatback, hot dogs, bratwurst, and packaged luncheon meats. Salted nuts and seeds. Canned beans with salt. °Dairy °Whole or 2% milk, cream, half-and-half, and cream cheese. Whole-fat or sweetened yogurt. Full-fat   cheeses or blue cheese. Nondairy creamers and whipped toppings. Processed cheese, cheese spreads, or cheese  curds. °Condiments °Onion and garlic salt, seasoned salt, table salt, and sea salt. Canned and packaged gravies. Worcestershire sauce. Tartar sauce. Barbecue sauce. Teriyaki sauce. Soy sauce, including reduced sodium. Steak sauce. Fish sauce. Oyster sauce. Cocktail sauce. Horseradish. Ketchup and mustard. Meat flavorings and tenderizers. Bouillon cubes. Hot sauce. Tabasco sauce. Marinades. Taco seasonings. Relishes. °Fats and Oils °Butter, stick margarine, lard, shortening, ghee, and bacon fat. Coconut, palm kernel, or palm oils. Regular salad dressings. °Other °Pickles and olives. Salted popcorn and pretzels. °The items listed above may not be a complete list of foods and beverages to avoid. Contact your dietitian for more information. °WHERE CAN I FIND MORE INFORMATION? °[employer], [employer], [employer]: www.nhlbi.nih.gov/health/health-topics/topics/dash/ °Document Released: 04/17/2011 Document Revised: 09/12/2013 Document Reviewed: 03/02/2013 °[employer]® Patient Information ©2015 [employer]. This information is not intended to replace advice given to you by your health care provider. Make sure you discuss any questions you have with your health care provider. ° °

## 2014-08-14 NOTE — Progress Notes (Signed)
Patient here for follow up on her blood pressure Patient states her blood pressure was up and down all weekend When her pressure was elevated she felt flushed  Patient states by Sunday she could hardly see Patient has her own cuff at home Patient also states she woke up this am with pain to her right arm

## 2014-08-16 ENCOUNTER — Ambulatory Visit: Payer: No Typology Code available for payment source | Admitting: Physical Therapy

## 2014-08-21 ENCOUNTER — Ambulatory Visit: Payer: No Typology Code available for payment source | Admitting: Physical Therapy

## 2014-08-23 ENCOUNTER — Encounter: Payer: No Typology Code available for payment source | Admitting: Physical Therapy

## 2014-08-28 ENCOUNTER — Encounter: Payer: No Typology Code available for payment source | Admitting: Physical Therapy

## 2014-08-28 ENCOUNTER — Ambulatory Visit: Payer: Self-pay | Admitting: Gastroenterology

## 2014-08-30 ENCOUNTER — Encounter: Payer: No Typology Code available for payment source | Admitting: Physical Therapy

## 2014-09-04 ENCOUNTER — Encounter: Payer: No Typology Code available for payment source | Admitting: Physical Therapy

## 2014-09-06 ENCOUNTER — Encounter: Payer: No Typology Code available for payment source | Admitting: Physical Therapy

## 2014-09-07 ENCOUNTER — Encounter (HOSPITAL_COMMUNITY)
Admission: RE | Admit: 2014-09-07 | Discharge: 2014-09-07 | Disposition: A | Discharge status: Home / Self Care | Payer: Self-pay | Source: Ambulatory Visit | Attending: Orthopaedic Surgery | Admitting: Orthopaedic Surgery

## 2014-09-07 ENCOUNTER — Other Ambulatory Visit (HOSPITAL_COMMUNITY): Payer: Self-pay | Admitting: Orthopaedic Surgery

## 2014-09-07 ENCOUNTER — Encounter (HOSPITAL_COMMUNITY): Payer: Self-pay

## 2014-09-07 HISTORY — DX: Personal history of other diseases of the respiratory system: Z87.09

## 2014-09-07 HISTORY — DX: Other chronic pain: G89.29

## 2014-09-07 HISTORY — DX: Dorsalgia, unspecified: M54.9

## 2014-09-07 HISTORY — DX: Insomnia, unspecified: G47.00

## 2014-09-07 HISTORY — DX: Effusion, unspecified joint: M25.40

## 2014-09-07 HISTORY — DX: Personal history of other venous thrombosis and embolism: Z86.718

## 2014-09-07 HISTORY — DX: Weakness: R53.1

## 2014-09-07 HISTORY — DX: Anxiety disorder, unspecified: F41.9

## 2014-09-07 HISTORY — DX: Pain in unspecified joint: M25.50

## 2014-09-07 LAB — CBC WITH DIFFERENTIAL/PLATELET
Basophils Absolute: 0 10*3/uL (ref 0.0–0.1)
Basophils Relative: 1 % (ref 0–1)
EOS ABS: 0 10*3/uL (ref 0.0–0.7)
EOS PCT: 1 % (ref 0–5)
HCT: 34.1 % — ABNORMAL LOW (ref 36.0–46.0)
HEMOGLOBIN: 11.2 g/dL — AB (ref 12.0–15.0)
LYMPHS ABS: 1.2 10*3/uL (ref 0.7–4.0)
Lymphocytes Relative: 22 % (ref 12–46)
MCH: 29.9 pg (ref 26.0–34.0)
MCHC: 32.8 g/dL (ref 30.0–36.0)
MCV: 91.2 fL (ref 78.0–100.0)
MONO ABS: 0.5 10*3/uL (ref 0.1–1.0)
MONOS PCT: 10 % (ref 3–12)
Neutro Abs: 3.6 10*3/uL (ref 1.7–7.7)
Neutrophils Relative %: 66 % (ref 43–77)
Platelets: 235 10*3/uL (ref 150–400)
RBC: 3.74 MIL/uL — AB (ref 3.87–5.11)
RDW: 13.8 % (ref 11.5–15.5)
WBC: 5.4 10*3/uL (ref 4.0–10.5)

## 2014-09-07 LAB — APTT: aPTT: 26 seconds (ref 24–37)

## 2014-09-07 LAB — URINALYSIS, ROUTINE W REFLEX MICROSCOPIC
Bilirubin Urine: NEGATIVE
GLUCOSE, UA: NEGATIVE mg/dL
HGB URINE DIPSTICK: NEGATIVE
Ketones, ur: NEGATIVE mg/dL
LEUKOCYTES UA: NEGATIVE
Nitrite: NEGATIVE
PH: 6 (ref 5.0–8.0)
PROTEIN: NEGATIVE mg/dL
SPECIFIC GRAVITY, URINE: 1.005 (ref 1.005–1.030)
Urobilinogen, UA: 0.2 mg/dL (ref 0.0–1.0)

## 2014-09-07 LAB — COMPREHENSIVE METABOLIC PANEL
ALT: 25 U/L (ref 0–35)
ANION GAP: 6 (ref 5–15)
AST: 25 U/L (ref 0–37)
Albumin: 3.6 g/dL (ref 3.5–5.2)
Alkaline Phosphatase: 102 U/L (ref 39–117)
BUN: 8 mg/dL (ref 6–23)
CALCIUM: 9.1 mg/dL (ref 8.4–10.5)
CO2: 25 mmol/L (ref 19–32)
CREATININE: 0.9 mg/dL (ref 0.50–1.10)
Chloride: 107 mmol/L (ref 96–112)
GFR calc Af Amer: 84 mL/min — ABNORMAL LOW (ref >90)
GFR, EST NON AFRICAN AMERICAN: 72 mL/min — AB (ref >90)
Glucose, Bld: 91 mg/dL (ref 70–99)
Potassium: 3.9 mmol/L (ref 3.5–5.1)
Sodium: 138 mmol/L (ref 135–145)
Total Bilirubin: 0.5 mg/dL (ref 0.3–1.2)
Total Protein: 6.7 g/dL (ref 6.0–8.3)

## 2014-09-07 LAB — PROTIME-INR
INR: 0.95 (ref 0.00–1.49)
Prothrombin Time: 12.7 seconds (ref 11.6–15.2)

## 2014-09-07 LAB — TYPE AND SCREEN
ABO/RH(D): A POS
ANTIBODY SCREEN: NEGATIVE

## 2014-09-07 LAB — SEDIMENTATION RATE: Sed Rate: 23 mm/hr — ABNORMAL HIGH (ref 0–22)

## 2014-09-07 LAB — SURGICAL PCR SCREEN
MRSA, PCR: NEGATIVE
Staphylococcus aureus: NEGATIVE

## 2014-09-07 LAB — ABO/RH: ABO/RH(D): A POS

## 2014-09-07 MED ORDER — CHLORHEXIDINE GLUCONATE 4 % EX LIQD: 60.0000 mL | Freq: Once | CUTANEOUS | Status: DC

## 2014-09-07 NOTE — Pre-Procedure Instructions (Signed)
[name]  09/07/2014   Your procedure is scheduled on:  Fri, May 6 @ 7:30 AM  Report to [facility] and go to Admitting at 5:30 AM.  Call this number if you have problems the morning of surgery: [phone]   Remember:   Do not eat food or drink liquids after midnight.   Take these medicines the morning of surgery with A SIP OF WATER: Celexa(Citalopram),Omeprazole(Prilosec),Pain Pill(if needed),Zantac(Ranitidine),and Tramadol(Ultram-if needed)              Stop taking your Aspirin as you have been instructed. No Goody's,BC's,Aleve,Ibuprofen,Fish Oil,or any Herbal Medications.    Do not wear jewelry, make-up or nail polish.  Do not wear lotions, powders, or perfumes. You may wear deodorant.  Do not shave 48 hours prior to surgery.   Do not bring valuables to the hospital.  [facility] is not responsible                  for any belongings or valuables.               Contacts, dentures or bridgework may not be worn into surgery.  Leave suitcase in the car. After surgery it may be brought to your room.  For patients admitted to the hospital, discharge time is determined by your                treatment team.                 Special Instructions:  [place] - Preparing for Surgery  Before surgery, you can play an important role.  Because skin is not sterile, your skin needs to be as free of germs as possible.  You can reduce the number of germs on you skin by washing with CHG (chlorahexidine gluconate) soap before surgery.  CHG is an antiseptic cleaner which kills germs and bonds with the skin to continue killing germs even after washing.  Please DO NOT use if you have an allergy to CHG or antibacterial soaps.  If your skin becomes reddened/irritated stop using the CHG and inform your nurse when you arrive at Short Stay.  Do not shave (including legs and underarms) for at least 48 hours prior to the first CHG shower.  You may shave your face.  Please follow these  instructions carefully:   1.  Shower with CHG Soap the night before surgery and the                                morning of Surgery.  2.  If you choose to wash your hair, wash your hair first as usual with your       normal shampoo.  3.  After you shampoo, rinse your hair and body thoroughly to remove the                      Shampoo.  4.  Use CHG as you would any other liquid soap.  You can apply chg directly       to the skin and wash gently with scrungie or a clean washcloth.  5.  Apply the CHG Soap to your body ONLY FROM THE NECK DOWN.        Do not use on open wounds or open sores.  Avoid contact with your eyes,       ears, mouth and genitals (private parts).  Wash genitals (private parts)       with your normal soap.  6.  Wash thoroughly, paying special attention to the area where your surgery        will be performed.  7.  Thoroughly rinse your body with warm water from the neck down.  8.  DO NOT shower/wash with your normal soap after using and rinsing off       the CHG Soap.  9.  Pat yourself dry with a clean towel.            10.  Wear clean pajamas.            11.  Place clean sheets on your bed the night of your first shower and do not        sleep with pets.  Day of Surgery  Do not apply any lotions/deoderants the morning of surgery.  Please wear clean clothes to the hospital/surgery center.     Please read over the following fact sheets that you were given: Pain Booklet, Coughing and Deep Breathing, Blood Transfusion Information, MRSA Information and Surgical Site Infection Prevention

## 2014-09-07 NOTE — Progress Notes (Addendum)
Pt doesn't have a Clinical research associate Md is  Dr. Lorayne Marek with [facility]  EKG in epic from 06-23-14  Echo report in epic from 2014  Denies ever having a Stress test/heart cath  Denies CXR in past yr

## 2014-09-07 NOTE — Progress Notes (Signed)
Requested orders from [name] at [facility] office.

## 2014-09-08 LAB — C-REACTIVE PROTEIN: CRP: 0.9 mg/dL — ABNORMAL HIGH (ref <0.60)

## 2014-09-11 ENCOUNTER — Other Ambulatory Visit (HOSPITAL_COMMUNITY): Payer: No Typology Code available for payment source

## 2014-09-14 MED ORDER — BUPIVACAINE LIPOSOME 1.3 % IJ SUSP
20.0000 mL | INTRAMUSCULAR | Status: AC
  Administered 2014-09-15: 20 mL
  Filled 2014-09-14: qty 20

## 2014-09-14 MED ORDER — CEFAZOLIN SODIUM-DEXTROSE 2-3 GM-% IV SOLR
2.0000 g | INTRAVENOUS | Status: AC
  Administered 2014-09-15: 2 g via INTRAVENOUS

## 2014-09-15 ENCOUNTER — Ambulatory Visit (HOSPITAL_COMMUNITY): Payer: Self-pay | Admitting: Anesthesiology

## 2014-09-15 ENCOUNTER — Encounter (HOSPITAL_COMMUNITY)
Admission: RE | Disposition: A | Discharge status: Home / Self Care | Payer: Self-pay | Source: Ambulatory Visit | Attending: Orthopaedic Surgery

## 2014-09-15 ENCOUNTER — Encounter (HOSPITAL_COMMUNITY): Payer: Self-pay | Admitting: *Deleted

## 2014-09-15 ENCOUNTER — Observation Stay (HOSPITAL_COMMUNITY)
Admission: RE | Admit: 2014-09-15 | Discharge: 2014-09-17 | Disposition: A | Discharge status: Home / Self Care | Payer: Self-pay | Source: Ambulatory Visit | Attending: Orthopaedic Surgery | Admitting: Orthopaedic Surgery

## 2014-09-15 ENCOUNTER — Observation Stay (HOSPITAL_COMMUNITY): Payer: No Typology Code available for payment source

## 2014-09-15 DIAGNOSIS — G4700 Insomnia, unspecified: Secondary | ICD-10-CM | POA: Insufficient documentation

## 2014-09-15 DIAGNOSIS — Z7982 Long term (current) use of aspirin: Secondary | ICD-10-CM | POA: Insufficient documentation

## 2014-09-15 DIAGNOSIS — I10 Essential (primary) hypertension: Secondary | ICD-10-CM | POA: Insufficient documentation

## 2014-09-15 DIAGNOSIS — M1711 Unilateral primary osteoarthritis, right knee: Principal | ICD-10-CM | POA: Diagnosis present

## 2014-09-15 DIAGNOSIS — Z96651 Presence of right artificial knee joint: Secondary | ICD-10-CM

## 2014-09-15 DIAGNOSIS — K449 Diaphragmatic hernia without obstruction or gangrene: Secondary | ICD-10-CM | POA: Insufficient documentation

## 2014-09-15 DIAGNOSIS — G43909 Migraine, unspecified, not intractable, without status migrainosus: Secondary | ICD-10-CM | POA: Insufficient documentation

## 2014-09-15 DIAGNOSIS — F419 Anxiety disorder, unspecified: Secondary | ICD-10-CM | POA: Insufficient documentation

## 2014-09-15 DIAGNOSIS — E785 Hyperlipidemia, unspecified: Secondary | ICD-10-CM | POA: Insufficient documentation

## 2014-09-15 HISTORY — PX: PARTIAL KNEE ARTHROPLASTY: SHX2174

## 2014-09-15 LAB — CBC
HCT: 35.6 % — ABNORMAL LOW (ref 36.0–46.0)
Hemoglobin: 11.7 g/dL — ABNORMAL LOW (ref 12.0–15.0)
MCH: 29.6 pg (ref 26.0–34.0)
MCHC: 32.9 g/dL (ref 30.0–36.0)
MCV: 90.1 fL (ref 78.0–100.0)
PLATELETS: 328 10*3/uL (ref 150–400)
RBC: 3.95 MIL/uL (ref 3.87–5.11)
RDW: 13.7 % (ref 11.5–15.5)
WBC: 12.7 10*3/uL — ABNORMAL HIGH (ref 4.0–10.5)

## 2014-09-15 LAB — CREATININE, SERUM
Creatinine, Ser: 0.84 mg/dL (ref 0.44–1.00)
GFR calc non Af Amer: >60 mL/min (ref >60)

## 2014-09-15 SURGERY — ARTHROPLASTY, KNEE, UNICOMPARTMENTAL
Anesthesia: General | Site: Knee | Laterality: Right

## 2014-09-15 MED ORDER — PROPOFOL 10 MG/ML IV BOLUS
INTRAVENOUS | Status: AC
  Filled 2014-09-15: qty 20

## 2014-09-15 MED ORDER — LACTATED RINGERS IV SOLN
INTRAVENOUS | Status: DC | PRN
  Administered 2014-09-15 (×2): via INTRAVENOUS

## 2014-09-15 MED ORDER — HYDROCHLOROTHIAZIDE 25 MG PO TABS
25.0000 mg | ORAL_TABLET | Freq: Every day | ORAL | Status: DC
Start: 2014-09-16 — End: 2014-09-17
  Administered 2014-09-16 – 2014-09-17 (×2): 25 mg via ORAL
  Filled 2014-09-15 (×2): qty 1

## 2014-09-15 MED ORDER — SODIUM CHLORIDE 0.9 % IJ SOLN
INTRAMUSCULAR | Status: AC
  Filled 2014-09-15: qty 10

## 2014-09-15 MED ORDER — LIDOCAINE HCL (CARDIAC) 20 MG/ML IV SOLN
INTRAVENOUS | Status: DC | PRN
  Administered 2014-09-15: 40 mg via INTRAVENOUS

## 2014-09-15 MED ORDER — MORPHINE SULFATE 2 MG/ML IJ SOLN: 1.0000 mg | INTRAMUSCULAR | Status: DC | PRN

## 2014-09-15 MED ORDER — 0.9 % SODIUM CHLORIDE (POUR BTL) OPTIME
TOPICAL | Status: DC | PRN
  Administered 2014-09-15: 1000 mL

## 2014-09-15 MED ORDER — ONDANSETRON HCL 4 MG/2ML IJ SOLN: 4.0000 mg | Freq: Once | INTRAMUSCULAR | Status: DC | PRN

## 2014-09-15 MED ORDER — ENOXAPARIN SODIUM 30 MG/0.3ML ~~LOC~~ SOLN: 30.0000 mg | Freq: Two times a day (BID) | SUBCUTANEOUS | Status: DC

## 2014-09-15 MED ORDER — FENTANYL CITRATE (PF) 250 MCG/5ML IJ SOLN
INTRAMUSCULAR | Status: AC
  Filled 2014-09-15: qty 5

## 2014-09-15 MED ORDER — DOCUSATE SODIUM 100 MG PO CAPS
100.0000 mg | ORAL_CAPSULE | Freq: Two times a day (BID) | ORAL | Status: DC
  Administered 2014-09-15 – 2014-09-17 (×4): 100 mg via ORAL
  Filled 2014-09-15 (×5): qty 1

## 2014-09-15 MED ORDER — ARTIFICIAL TEARS OP OINT
TOPICAL_OINTMENT | OPHTHALMIC | Status: DC | PRN
  Administered 2014-09-15: 1 via OPHTHALMIC

## 2014-09-15 MED ORDER — METHOCARBAMOL 1000 MG/10ML IJ SOLN
500.0000 mg | Freq: Four times a day (QID) | INTRAVENOUS | Status: DC | PRN
  Administered 2014-09-15: 500 mg via INTRAVENOUS
  Filled 2014-09-15 (×2): qty 5

## 2014-09-15 MED ORDER — ROCURONIUM BROMIDE 50 MG/5ML IV SOLN
INTRAVENOUS | Status: AC
  Filled 2014-09-15: qty 1

## 2014-09-15 MED ORDER — KETOROLAC TROMETHAMINE 30 MG/ML IJ SOLN
30.0000 mg | Freq: Four times a day (QID) | INTRAMUSCULAR | Status: AC | PRN
  Administered 2014-09-15 – 2014-09-16 (×2): 30 mg via INTRAVENOUS
  Filled 2014-09-15 (×3): qty 1

## 2014-09-15 MED ORDER — ALUM & MAG HYDROXIDE-SIMETH 200-200-20 MG/5ML PO SUSP: 30.0000 mL | ORAL | Status: DC | PRN

## 2014-09-15 MED ORDER — MAGNESIUM CITRATE PO SOLN: 1.0000 | Freq: Once | ORAL | Status: AC | PRN

## 2014-09-15 MED ORDER — CELECOXIB 200 MG PO CAPS
200.0000 mg | ORAL_CAPSULE | Freq: Two times a day (BID) | ORAL | Status: DC
  Administered 2014-09-15 – 2014-09-17 (×4): 200 mg via ORAL
  Filled 2014-09-15 (×4): qty 1

## 2014-09-15 MED ORDER — ARTIFICIAL TEARS OP OINT
TOPICAL_OINTMENT | OPHTHALMIC | Status: AC
  Filled 2014-09-15: qty 3.5

## 2014-09-15 MED ORDER — MIDAZOLAM HCL 2 MG/2ML IJ SOLN
INTRAMUSCULAR | Status: AC
  Filled 2014-09-15: qty 2

## 2014-09-15 MED ORDER — CITALOPRAM HYDROBROMIDE 10 MG PO TABS
10.0000 mg | ORAL_TABLET | Freq: Every day | ORAL | Status: DC
  Administered 2014-09-16 – 2014-09-17 (×2): 10 mg via ORAL
  Filled 2014-09-15 (×2): qty 1

## 2014-09-15 MED ORDER — METOCLOPRAMIDE HCL 5 MG PO TABS: 5.0000 mg | ORAL_TABLET | Freq: Three times a day (TID) | ORAL | Status: DC | PRN

## 2014-09-15 MED ORDER — HYDROMORPHONE HCL 1 MG/ML IJ SOLN
INTRAMUSCULAR | Status: AC
  Filled 2014-09-15: qty 1

## 2014-09-15 MED ORDER — PHENOL 1.4 % MT LIQD: 1.0000 | OROMUCOSAL | Status: DC | PRN

## 2014-09-15 MED ORDER — METHOCARBAMOL 750 MG PO TABS: 750.0000 mg | ORAL_TABLET | Freq: Two times a day (BID) | ORAL | Status: DC | PRN

## 2014-09-15 MED ORDER — METHOCARBAMOL 500 MG PO TABS
500.0000 mg | ORAL_TABLET | Freq: Four times a day (QID) | ORAL | Status: DC | PRN
  Administered 2014-09-16 – 2014-09-17 (×4): 500 mg via ORAL
  Filled 2014-09-15 (×5): qty 1

## 2014-09-15 MED ORDER — ENOXAPARIN SODIUM 30 MG/0.3ML ~~LOC~~ SOLN
30.0000 mg | Freq: Two times a day (BID) | SUBCUTANEOUS | Status: DC
  Administered 2014-09-16 – 2014-09-17 (×3): 30 mg via SUBCUTANEOUS
  Filled 2014-09-15 (×3): qty 0.3

## 2014-09-15 MED ORDER — LIDOCAINE HCL (CARDIAC) 20 MG/ML IV SOLN
INTRAVENOUS | Status: AC
  Filled 2014-09-15: qty 5

## 2014-09-15 MED ORDER — SODIUM CHLORIDE 0.9 % IJ SOLN
INTRAMUSCULAR | Status: DC | PRN
  Administered 2014-09-15: 40 mL

## 2014-09-15 MED ORDER — SENNOSIDES-DOCUSATE SODIUM 8.6-50 MG PO TABS: 1.0000 | ORAL_TABLET | Freq: Every evening | ORAL | Status: DC | PRN

## 2014-09-15 MED ORDER — FENTANYL CITRATE (PF) 100 MCG/2ML IJ SOLN
INTRAMUSCULAR | Status: DC | PRN
  Administered 2014-09-15 (×5): 50 ug via INTRAVENOUS

## 2014-09-15 MED ORDER — ONDANSETRON HCL 4 MG PO TABS: 4.0000 mg | ORAL_TABLET | Freq: Four times a day (QID) | ORAL | Status: DC | PRN

## 2014-09-15 MED ORDER — OXYCODONE HCL 5 MG/5ML PO SOLN: 5.0000 mg | Freq: Once | ORAL | Status: AC | PRN

## 2014-09-15 MED ORDER — PANTOPRAZOLE SODIUM 40 MG PO TBEC
80.0000 mg | DELAYED_RELEASE_TABLET | Freq: Every day | ORAL | Status: DC
  Administered 2014-09-16 – 2014-09-17 (×2): 80 mg via ORAL
  Filled 2014-09-15 (×2): qty 2

## 2014-09-15 MED ORDER — ONDANSETRON HCL 4 MG/2ML IJ SOLN
INTRAMUSCULAR | Status: AC
  Filled 2014-09-15: qty 2

## 2014-09-15 MED ORDER — MIDAZOLAM HCL 5 MG/5ML IJ SOLN
INTRAMUSCULAR | Status: DC | PRN
  Administered 2014-09-15: 2 mg via INTRAVENOUS

## 2014-09-15 MED ORDER — OXYCODONE HCL 5 MG PO TABS
5.0000 mg | ORAL_TABLET | Freq: Once | ORAL | Status: AC | PRN
  Administered 2014-09-15: 5 mg via ORAL

## 2014-09-15 MED ORDER — OXYCODONE HCL 5 MG PO TABS
ORAL_TABLET | ORAL | Status: AC
  Filled 2014-09-15: qty 1

## 2014-09-15 MED ORDER — FAMOTIDINE 20 MG PO TABS
20.0000 mg | ORAL_TABLET | Freq: Two times a day (BID) | ORAL | Status: DC
  Administered 2014-09-15 – 2014-09-17 (×4): 20 mg via ORAL
  Filled 2014-09-15 (×5): qty 1

## 2014-09-15 MED ORDER — OXYCODONE-ACETAMINOPHEN 5-325 MG PO TABS: 1.0000 | ORAL_TABLET | ORAL | Status: DC | PRN

## 2014-09-15 MED ORDER — ACETAMINOPHEN 325 MG PO TABS
650.0000 mg | ORAL_TABLET | Freq: Four times a day (QID) | ORAL | Status: DC | PRN
  Administered 2014-09-17: 650 mg via ORAL
  Filled 2014-09-15 (×2): qty 2

## 2014-09-15 MED ORDER — DIPHENHYDRAMINE HCL 12.5 MG/5ML PO ELIX: 25.0000 mg | ORAL_SOLUTION | ORAL | Status: DC | PRN

## 2014-09-15 MED ORDER — SORBITOL 70 % SOLN: 30.0000 mL | Freq: Every day | Status: DC | PRN

## 2014-09-15 MED ORDER — METOCLOPRAMIDE HCL 5 MG/ML IJ SOLN: 5.0000 mg | Freq: Three times a day (TID) | INTRAMUSCULAR | Status: DC | PRN

## 2014-09-15 MED ORDER — ONDANSETRON HCL 4 MG/2ML IJ SOLN
INTRAMUSCULAR | Status: DC | PRN
  Administered 2014-09-15: 4 mg via INTRAVENOUS

## 2014-09-15 MED ORDER — HYDROMORPHONE HCL 1 MG/ML IJ SOLN
0.2500 mg | INTRAMUSCULAR | Status: DC | PRN
  Administered 2014-09-15 (×3): 0.5 mg via INTRAVENOUS

## 2014-09-15 MED ORDER — PROPOFOL INFUSION 10 MG/ML OPTIME
INTRAVENOUS | Status: DC | PRN
  Administered 2014-09-15: 50 ug/kg/min via INTRAVENOUS

## 2014-09-15 MED ORDER — OXYCODONE HCL 5 MG PO TABS
5.0000 mg | ORAL_TABLET | ORAL | Status: DC | PRN
  Administered 2014-09-15 – 2014-09-17 (×8): 10 mg via ORAL
  Filled 2014-09-15 (×8): qty 2

## 2014-09-15 MED ORDER — ACETAMINOPHEN 500 MG PO TABS
1000.0000 mg | ORAL_TABLET | Freq: Four times a day (QID) | ORAL | Status: AC
  Administered 2014-09-15 – 2014-09-16 (×4): 1000 mg via ORAL
  Filled 2014-09-15 (×4): qty 2

## 2014-09-15 MED ORDER — ONDANSETRON HCL 4 MG/2ML IJ SOLN: 4.0000 mg | Freq: Four times a day (QID) | INTRAMUSCULAR | Status: DC | PRN

## 2014-09-15 MED ORDER — POLYETHYLENE GLYCOL 3350 17 G PO PACK: 17.0000 g | PACK | Freq: Every day | ORAL | Status: DC | PRN

## 2014-09-15 MED ORDER — PROPOFOL 10 MG/ML IV BOLUS
INTRAVENOUS | Status: DC | PRN
  Administered 2014-09-15: 180 mg via INTRAVENOUS
  Administered 2014-09-15 (×2): 40 mg via INTRAVENOUS
  Administered 2014-09-15: 20 mg via INTRAVENOUS
  Administered 2014-09-15: 50 mg via INTRAVENOUS
  Administered 2014-09-15: 30 mg via INTRAVENOUS

## 2014-09-15 MED ORDER — OXYCODONE HCL ER 10 MG PO T12A
10.0000 mg | EXTENDED_RELEASE_TABLET | Freq: Two times a day (BID) | ORAL | Status: DC
  Administered 2014-09-15 – 2014-09-17 (×4): 10 mg via ORAL
  Filled 2014-09-15 (×4): qty 1

## 2014-09-15 MED ORDER — EPHEDRINE SULFATE 50 MG/ML IJ SOLN
INTRAMUSCULAR | Status: AC
  Filled 2014-09-15: qty 1

## 2014-09-15 MED ORDER — LISINOPRIL 40 MG PO TABS
40.0000 mg | ORAL_TABLET | Freq: Every day | ORAL | Status: DC
  Administered 2014-09-16 – 2014-09-17 (×2): 40 mg via ORAL
  Filled 2014-09-15 (×2): qty 1

## 2014-09-15 MED ORDER — SODIUM CHLORIDE 0.9 % IV SOLN
INTRAVENOUS | Status: DC
  Administered 2014-09-15: 18:00:00 via INTRAVENOUS

## 2014-09-15 MED ORDER — ATORVASTATIN CALCIUM 10 MG PO TABS
10.0000 mg | ORAL_TABLET | Freq: Every day | ORAL | Status: DC
  Administered 2014-09-15 – 2014-09-16 (×2): 10 mg via ORAL
  Filled 2014-09-15 (×2): qty 1

## 2014-09-15 MED ORDER — MENTHOL 3 MG MT LOZG: 1.0000 | LOZENGE | OROMUCOSAL | Status: DC | PRN

## 2014-09-15 MED ORDER — CEFAZOLIN SODIUM-DEXTROSE 2-3 GM-% IV SOLR
2.0000 g | Freq: Four times a day (QID) | INTRAVENOUS | Status: AC
  Administered 2014-09-15 (×2): 2 g via INTRAVENOUS
  Filled 2014-09-15 (×2): qty 50

## 2014-09-15 MED ORDER — ACETAMINOPHEN 650 MG RE SUPP: 650.0000 mg | Freq: Four times a day (QID) | RECTAL | Status: DC | PRN

## 2014-09-15 MED ORDER — SUCCINYLCHOLINE CHLORIDE 20 MG/ML IJ SOLN
INTRAMUSCULAR | Status: AC
  Filled 2014-09-15: qty 1

## 2014-09-15 SURGICAL SUPPLY — 72 items
ADH SKN CLS APL DERMABOND .7 (GAUZE/BANDAGES/DRESSINGS) ×1
BANDAGE ELASTIC 6 VELCRO ST LF (GAUZE/BANDAGES/DRESSINGS) ×2 IMPLANT
BLADE SAG 18X100X1.27 (BLADE) ×2 IMPLANT
BLADE SAW RECIP 87.9 MT (BLADE) ×1 IMPLANT
BLADE SAW SGTL 13.0X1.19X90.0M (BLADE) ×2 IMPLANT
BNDG CMPR MED 10X6 ELC LF (GAUZE/BANDAGES/DRESSINGS) ×1
BNDG ELASTIC 6X10 VLCR STRL LF (GAUZE/BANDAGES/DRESSINGS) ×1 IMPLANT
BONE CEMENT PALACOSE (Orthopedic Implant) ×2 IMPLANT
BOWL SMART MIX CTS (DISPOSABLE) ×2 IMPLANT
CAPT KNEE PARTIAL 2 ×1 IMPLANT
CEMENT BONE PALACOSE (Orthopedic Implant) ×2 IMPLANT
COVER SURGICAL LIGHT HANDLE (MISCELLANEOUS) ×2 IMPLANT
CUFF TOURNIQUET SINGLE 34IN LL (TOURNIQUET CUFF) ×2 IMPLANT
CUFF TOURNIQUET SINGLE 44IN (TOURNIQUET CUFF) IMPLANT
DERMABOND ADVANCED (GAUZE/BANDAGES/DRESSINGS) ×1
DERMABOND ADVANCED .7 DNX12 (GAUZE/BANDAGES/DRESSINGS) ×1 IMPLANT
DRAPE EXTREMITY T 121X128X90 (DRAPE) ×2 IMPLANT
DRAPE IMP U-DRAPE 54X76 (DRAPES) ×1 IMPLANT
DRAPE INCISE IOBAN 66X45 STRL (DRAPES) ×2 IMPLANT
DRAPE ORTHO SPLIT 77X108 STRL (DRAPES) ×4
DRAPE PROXIMA HALF (DRAPES) ×1 IMPLANT
DRAPE SURG 17X11 SM STRL (DRAPES) ×4 IMPLANT
DRAPE SURG ORHT 6 SPLT 77X108 (DRAPES) ×2 IMPLANT
DRAPE U-SHAPE 47X51 STRL (DRAPES) ×2 IMPLANT
DRSG AQUACEL AG ADV 3.5X10 (GAUZE/BANDAGES/DRESSINGS) ×1 IMPLANT
DRSG AQUACEL AG ADV 3.5X14 (GAUZE/BANDAGES/DRESSINGS) ×1 IMPLANT
DRSG PAD ABDOMINAL 8X10 ST (GAUZE/BANDAGES/DRESSINGS) ×1 IMPLANT
DURAPREP 26ML APPLICATOR (WOUND CARE) ×5 IMPLANT
ELECT CAUTERY BLADE 6.4 (BLADE) ×1 IMPLANT
ELECT REM PT RETURN 9FT ADLT (ELECTROSURGICAL) ×2
ELECTRODE REM PT RTRN 9FT ADLT (ELECTROSURGICAL) ×1 IMPLANT
EVACUATOR 1/8 PVC DRAIN (DRAIN) IMPLANT
FACESHIELD WRAPAROUND (MASK) IMPLANT
FACESHIELD WRAPAROUND OR TEAM (MASK) ×3 IMPLANT
GAUZE SPONGE 4X4 12PLY STRL (GAUZE/BANDAGES/DRESSINGS) ×1 IMPLANT
GAUZE XEROFORM 5X9 LF (GAUZE/BANDAGES/DRESSINGS) ×1 IMPLANT
GLOVE SURG SYN 7.5  E (GLOVE) ×2
GLOVE SURG SYN 7.5 E (GLOVE) ×2 IMPLANT
GLOVE SURG SYN 7.5 PF PI (GLOVE) ×2 IMPLANT
GOWN STRL REIN XL XLG (GOWN DISPOSABLE) ×4 IMPLANT
HANDPIECE INTERPULSE COAX TIP (DISPOSABLE) ×2
HOOD SURGICAL BLUE (PROTECTIVE WEAR) ×2 IMPLANT
KIT BASIN OR (CUSTOM PROCEDURE TRAY) ×2 IMPLANT
KIT ROOM TURNOVER OR (KITS) ×2 IMPLANT
MANIFOLD NEPTUNE II (INSTRUMENTS) ×2 IMPLANT
MARKER SKIN DUAL TIP RULER LAB (MISCELLANEOUS) ×1 IMPLANT
NDL SPNL 18GX3.5 QUINCKE PK (NEEDLE) ×1 IMPLANT
NEEDLE SPNL 18GX3.5 QUINCKE PK (NEEDLE) ×2 IMPLANT
NS IRRIG 1000ML POUR BTL (IV SOLUTION) ×2 IMPLANT
PACK BLADE SAW RECIP 70 3 PT (BLADE) ×1 IMPLANT
PACK TOTAL JOINT (CUSTOM PROCEDURE TRAY) ×2 IMPLANT
PACK UNIVERSAL I (CUSTOM PROCEDURE TRAY) ×2 IMPLANT
PAD ARMBOARD 7.5X6 YLW CONV (MISCELLANEOUS) ×3 IMPLANT
PADDING CAST COTTON 6X4 STRL (CAST SUPPLIES) ×2 IMPLANT
PEN SKIN MARKING BROAD (MISCELLANEOUS) ×2 IMPLANT
SET HNDPC FAN SPRY TIP SCT (DISPOSABLE) ×1 IMPLANT
SET PAD KNEE POSITIONER (MISCELLANEOUS) ×1 IMPLANT
STAPLER VISISTAT 35W (STAPLE) IMPLANT
SUCTION FRAZIER TIP 10 FR DISP (SUCTIONS) ×1 IMPLANT
SUT ETHILON 2 0 FS 18 (SUTURE) ×5 IMPLANT
SUT VIC AB 0 CT1 27 (SUTURE) ×2
SUT VIC AB 0 CT1 27XBRD ANBCTR (SUTURE) ×2 IMPLANT
SUT VIC AB 1 CT1 27 (SUTURE) ×6
SUT VIC AB 1 CT1 27XBRD ANBCTR (SUTURE) ×2 IMPLANT
SUT VIC AB 2-0 CT1 27 (SUTURE) ×6
SUT VIC AB 2-0 CT1 TAPERPNT 27 (SUTURE) ×3 IMPLANT
SYR 50ML LL SCALE MARK (SYRINGE) ×2 IMPLANT
SYR BULB IRRIGATION 50ML (SYRINGE) ×1 IMPLANT
TOWEL OR 17X24 6PK STRL BLUE (TOWEL DISPOSABLE) ×2 IMPLANT
TOWEL OR 17X26 10 PK STRL BLUE (TOWEL DISPOSABLE) ×2 IMPLANT
WATER STERILE IRR 1000ML POUR (IV SOLUTION) ×2 IMPLANT
WRAP KNEE MAXI GEL POST OP (GAUZE/BANDAGES/DRESSINGS) ×2 IMPLANT

## 2014-09-15 NOTE — Anesthesia Postprocedure Evaluation (Signed)
  Anesthesia Post-op Note  Patient: [name]  Procedure(s) Performed: Procedure(s): RIGHT UNICOMPARTMENTAL KNEE ARTHROPLASTY (Right)  Patient Location: PACU  Anesthesia Type:General and GA combined with regional for post-op pain  Level of Consciousness: awake, alert  and oriented  Airway and Oxygen Therapy: Patient Spontanous Breathing and Patient connected to nasal cannula oxygen  Post-op Pain: mild  Post-op Assessment: Post-op Vital signs reviewed, Patient's Cardiovascular Status Stable, Respiratory Function Stable, Patent Airway and Pain level controlled  Post-op Vital Signs: stable  Last Vitals:  Filed Vitals:   09/15/14 1253  BP: 137/79  Pulse: 87  Temp: 36.8 C  Resp: 14    Complications: No apparent anesthesia complications

## 2014-09-15 NOTE — H&P (Signed)
PREOPERATIVE H&P  Chief Complaint: Right knee medial osteoarthritis  HPI: [name] is a 54 y.o. female who presents for surgical treatment of Right knee medial osteoarthritis.  She denies any changes in medical history.  Past Medical History  Diagnosis Date  . Hyperlipidemia     takes Atorvastatin daily  . Arthritis     right knee  . Paraesophageal hernia   . Acid reflux     takes Zantac and Omeprazole daily  . History of blood clots     64yrs ago and in left leg  . History of bronchitis 3+yrs ago  . History of migraine     has been a while since last one  . Weakness     numbness and tingling in both feet  . Joint pain   . Joint swelling   . Chronic back pain     DDD  . Hypertension     takes Lisinopril and HCTZ daily  . Insomnia     takes Elavil nightly as needed  . Anxiety     takes Citaopram daily   Past Surgical History  Procedure Laterality Date  . Total knee arthroplasty Left 04/08/2004  . Ganglion cyst excision Left 01/13/2002  . Colonoscopy N/A 05/29/2014    Procedure: COLONOSCOPY;  Surgeon: Daneil Dolin, MD;  [facility];  Service: Endoscopy;  Laterality: N/A;  215pm- Pt is working until 12:00 so she can't come any earlier  . Esophagogastroduodenoscopy N/A 05/29/2014    Procedure: ESOPHAGOGASTRODUODENOSCOPY (EGD);  Surgeon: Daneil Dolin, MD;  [facility];  Service: Endoscopy;  Laterality: N/A;  Venia Minks dilation N/A 05/29/2014    Procedure: Venia Minks DILATION;  Surgeon: Daneil Dolin, MD;  [facility];  Service: Endoscopy;  Laterality: N/A;  . Knee arthroscopy with medial menisectomy Right 06/28/2014    Procedure: RIGHT KNEE ARTHROSCOPY WITH PARTIAL MEDIAL MENISCECTOMY AND CHONDROPLASTY;  Surgeon: Marianna Payment, MD;  Location: [place];  Service: Orthopedics;  Laterality: Right;  . Chondroplasty Right 06/28/2014    Procedure: CHONDROPLASTY;  Surgeon: Marianna Payment, MD;  Location: [place];  Service: Orthopedics;  Laterality: Right;   History   Social History  . Marital Status: Divorced    Spouse Name: N/A  . Number of Children: N/A  . Years of Education: N/A   Social History Main Topics  . Smoking status: Never Smoker   . Smokeless tobacco: Never Used  . Alcohol Use: No  . Drug Use: No  . Sexual Activity: Not Currently    Birth Control/ Protection: None   Other Topics Concern  . None   Social History Narrative   Family History  Problem Relation Age of Onset  . Cancer Father     lung  . Breast cancer Sister   . Cancer Maternal Grandfather     lung  . Cancer Paternal Grandfather     lung  . Colon cancer Neg Hx    No Known Allergies Prior to Admission medications   Medication Sig Start Date End Date Taking? Authorizing Provider  atorvastatin (LIPITOR) 10 MG tablet TAKE 1 TABLET BY MOUTH DAILY AT 6 PM. 05/09/14  Yes Deepak Advani, MD  citalopram (CELEXA) 10 MG tablet Take 1 tablet (10 mg total) by mouth daily. Patient taking differently: Take 10 mg by mouth daily. Pt. Takes 1/2 tablet 08/14/14  Yes Deepak Advani, MD  hydrochlorothiazide (HYDRODIURIL) 25 MG tablet Take 1 tablet (25 mg total)  by mouth daily. 06/19/14  Yes Tiffany Daneil Dan, PA-C  lisinopril (PRINIVIL,ZESTRIL) 40 MG tablet Take 1 tablet (40 mg total) by mouth daily. 06/19/14  Yes Tiffany Daneil Dan, PA-C  omeprazole (PRILOSEC) 40 MG capsule Take 40 mg by mouth 2 (two) times daily.   Yes Historical Provider, MD  ranitidine (ZANTAC) 150 MG tablet Take 150 mg by mouth 2 (two) times daily.   Yes Historical Provider, MD  traMADol (ULTRAM) 50 MG tablet Take 1 tablet (50 mg total) by mouth every 6 (six) hours as needed. 06/07/14  Yes Gerda Diss, DO  aspirin EC 325 MG tablet Take 1 tablet (325 mg total) by mouth 2 (two) times daily. Patient not taking: Reported on 07/24/2014 06/28/14   Leandrew Koyanagi, MD  oxyCODONE (OXY IR/ROXICODONE) 5 MG immediate release tablet Take 1-3 tablets (5-15 mg total) by mouth  every 4 (four) hours as needed. Patient not taking: Reported on 07/24/2014 06/28/14   Leandrew Koyanagi, MD     Positive ROS: All other systems have been reviewed and were otherwise negative with the exception of those mentioned in the HPI and as above.  Physical Exam: General: Alert, no acute distress Cardiovascular: No pedal edema Respiratory: No cyanosis, no use of accessory musculature GI: abdomen soft Skin: No lesions in the area of chief complaint Neurologic: Sensation intact distally Psychiatric: Patient is competent for consent with normal mood and affect Lymphatic: no lymphedema  MUSCULOSKELETAL: no lesions  Assessment: Right knee medial osteoarthritis  Plan: Plan for Procedure(s): RIGHT UNICOMPARTMENTAL KNEE ARTHROPLASTY  The risks benefits and alternatives were discussed with the patient including but not limited to the risks of nonoperative treatment, versus surgical intervention including infection, bleeding, nerve injury,  blood clots, cardiopulmonary complications, morbidity, mortality, among others, and they were willing to proceed.   Marianna Payment, MD   09/15/2014 7:09 AM

## 2014-09-15 NOTE — Progress Notes (Signed)
Report given to maria rn as caregiver 

## 2014-09-15 NOTE — Anesthesia Procedure Notes (Addendum)
Anesthesia Regional Block:  Adductor canal block  Pre-Anesthetic Checklist: ,, timeout performed, Correct Patient, Correct Site, Correct Laterality, Correct Procedure, Correct Position, site marked, Risks and benefits discussed,  Surgical consent,  Pre-op evaluation,  At surgeon's request and post-op pain management  Laterality: Right  Prep: chloraprep       Needles:  Injection technique: Single-shot  Needle Type: Echogenic Stimulator Needle     Needle Length: 9cm 9 cm Needle Gauge: 22 and 22 G    Additional Needles:  Procedures: ultrasound guided (picture in chart) Adductor canal block Narrative:  Start time: 09/15/2014 7:20 AM End time: 09/15/2014 7:25 AM Injection made incrementally with aspirations every 5 mL.  Events: blood aspirated  Performed by: Personally   Additional Notes: 30 cc 0.5% Marcaine 1:200 Epi injected easily   Procedure Name: LMA Insertion Date/Time: 09/15/2014 7:40 AM Performed by: Scheryl Darter Pre-anesthesia Checklist: Patient identified, Emergency Drugs available, Suction available, Patient being monitored and Timeout performed Patient Re-evaluated:Patient Re-evaluated prior to inductionOxygen Delivery Method: Circle system utilized Preoxygenation: Pre-oxygenation with 100% oxygen Intubation Type: IV induction Ventilation: Mask ventilation without difficulty LMA Size: 4.0 Number of attempts: 1 Placement Confirmation: positive ETCO2 and breath sounds checked- equal and bilateral Tube secured with: Tape Dental Injury: Teeth and Oropharynx as per pre-operative assessment

## 2014-09-15 NOTE — Transfer of Care (Signed)
Immediate Anesthesia Transfer of Care Note  Patient: [name]  Procedure(s) Performed: Procedure(s): RIGHT UNICOMPARTMENTAL KNEE ARTHROPLASTY (Right)  Patient Location: PACU  Anesthesia Type:General  Level of Consciousness: awake, alert , oriented and sedated  Airway & Oxygen Therapy: Patient Spontanous Breathing and Patient connected to nasal cannula oxygen  Post-op Assessment: Report given to RN, Post -op Vital signs reviewed and stable and Patient moving all extremities  Post vital signs: Reviewed and stable  Last Vitals:  Filed Vitals:   09/15/14 1100  BP: 126/62  Pulse:   Temp:   Resp:     Complications: No apparent anesthesia complications

## 2014-09-15 NOTE — Op Note (Signed)
Date of Surgery: 09/15/2014  INDICATIONS: Ms. Bills is a 54 y.o.-year-old female who presents with right knee medial osteoarthritis;  The patient did consent to the procedure after discussion of the risks and benefits.  PREOPERATIVE DIAGNOSIS: Right knee medial osteoarthritis  POSTOPERATIVE DIAGNOSIS: Same.  PROCEDURE: Right unicompartmental knee arthroplasty  SURGEON: N. Eduard Roux, M.D.  ASSIST: Ky Barban, RNFA.  ANESTHESIA:  general, regional  IV FLUIDS AND URINE: See anesthesia.  ESTIMATED BLOOD LOSS: Minimal mL.  IMPLANTS: [employer] medial knee arthroplasty size small and 3 mm poly  DRAINS: None  COMPLICATIONS: None.  DESCRIPTION OF PROCEDURE: The patient was brought to the operating room and placed supine on the operating table.  The patient had been signed prior to the procedure and this was documented. The patient had the anesthesia placed by the anesthesiologist.  A time-out was performed to confirm that this was the correct patient, site, side and location. The patient had an SCD on the opposite lower extremity. A nonsterile tourniquet was placed on the upper right thigh and the leg was placed in the legholder. The patient did receive antibiotics prior to the incision and was re-dosed during the procedure as needed at indicated intervals.  The patient had the operative extremity prepped and draped in the standard surgical fashion.    The extremity was elevated for exsanguination and the tourniquet was inflated to 350 mmHg. We use a longitudinal incision from the medial border of the patella down to the tibial tubercle. Blunt dissection was taken down to the level of the peritenon. A limited medial parapatellar arthrotomy was created. The infrapatellar fat pad was removed. No soft tissue releases were performed. We removed what we could see of the medial meniscus. We then sized a small spoon to the patient. We placed the tibial cutting guide to the spoon and clamped it in  place. The guide was placed parallel to the anterior cortex of the tibia and in line with the tibia. We took off the osteophytes from the medial femoral condyle. We then created our sagittal cut just medial to the downslope of the medial tibial eminence. We then protected the MCL with the retractor and completed our transverse tibial cut. The cut tibia was taken out and sized to a size B tibia. The tibia did exhibit classic anterior medial wear pattern. Once this was done we then placed an intramedullary rod down the femur and linked up the femoral cutting guide. Right to doing this we did draw a line down the center of the medial femoral condyle. With the cutting guide in place we were able to visualize the lines through the holes. After the appropriate holes were drilled we then prepared the femur with the [facility]. Once this was done we then made our posterior condyle cut. Once this was done we then placed trial components in to assess our gaps. We determined that we needed to take an extra 2 mm from the distal femur in order to balance our gaps. With the gaps balanced we then prepared the rest of the femur and the tibia. We then began with cement mixing and while this was done we prepared the bony surfaces and we thoroughly irrigated the bony surfaces.  We injected a mixture of 20 mL of external and 40 mL of saline in the posterior capsule and the medial retinaculum.  We then cemented our final implants.  Care was taken to remove all excess cement. We then allowed the cement to dry. After this  was done we then placed the final polyethylene liner in. There was excellent tracking of the knee.  We then closed the arthrotomy with interrupted #1 Vicryl sutures, the subcutaneous layer with interrupted 2-0 Vicryl, the skin with interrupted 2-0 nylon. Sterile dressings were applied. The patient was extubated and transferred to the PACU in stable condition. All sponge counts were correct.  POSTOPERATIVE PLAN: The patient  will be admitted overnight for observation and pain control. The patient will be weightbearing as tolerated. Anticipate being able to discharge her in the morning.  Azucena Cecil, MD [place] 9:16 AM

## 2014-09-15 NOTE — Evaluation (Signed)
Physical Therapy Evaluation Patient Details Name: [name] MRN: [id number] DOB: 01-14-1961 Today's Date: 09/15/2014   History of Present Illness  Patient is a 54 y/o female s/p Rt unicomparmental knee arthoplasty. PMH of HLD, HTN, blood clots, migraine, anxiety, back pain  Clinical Impression  Patient presents with pain and post surgical deficits RLE s/p above surgery. Education provided on precautions and HEP. Tolerated short distance ambulation with min guard assist however declined sitting in chair due to + nausea and sick on stomach. Pt will have assist from son at home. Would benefit from skilled PT to improve transfers, gait, balance and mobility so pt can maximize independence and return to PLOF.    Follow Up Recommendations Home health PT;Supervision/Assistance - 24 hour    Equipment Recommendations  Rolling walker with 5" wheels    Recommendations for Other Services OT consult     Precautions / Restrictions Precautions Precautions: Knee;Fall Precaution Booklet Issued: No Precaution Comments: Reviewed precautions and HEP. Restrictions Weight Bearing Restrictions: No      Mobility  Bed Mobility Overal bed mobility: Needs Assistance Bed Mobility: Supine to Sit     Supine to sit: Min guard;HOB elevated     General bed mobility comments: Increased time due to + nausea/pain. Use of rail.  Transfers Overall transfer level: Needs assistance Equipment used: Rolling walker (2 wheeled) Transfers: Sit to/from Stand Sit to Stand: Min assist         General transfer comment: Min A to rise from EOB with multiple attempts. Cues for hand placement. Stood from toilet x1.   Ambulation/Gait Ambulation/Gait assistance: Min guard Ambulation Distance (Feet): 15 Feet (x2 bouts) Assistive device: Rolling walker (2 wheeled) Gait Pattern/deviations: Step-to pattern;Decreased stance time - right;Decreased step length - left;Trunk flexed   Gait velocity interpretation: Below  normal speed for age/gender General Gait Details: Pt with slow, unsteady gait. + upset stomach, dizziness.  Stairs            Wheelchair Mobility    Modified Rankin (Stroke Patients Only)       Balance Overall balance assessment: Needs assistance Sitting-balance support: Feet supported;No upper extremity supported Sitting balance-Leahy Scale: Good     Standing balance support: During functional activity Standing balance-Leahy Scale: Fair                               Pertinent Vitals/Pain Pain Assessment: Faces Faces Pain Scale: Hurts even more Pain Location: right knee Pain Descriptors / Indicators: Sore;Aching;Sharp Pain Intervention(s): Limited activity within patient's tolerance;Monitored during session;Repositioned;Premedicated before session    Home Living Family/patient expects to be discharged to:: Private residence Living Arrangements: Children (Son)   Type of Home: Mobile home Home Access: Stairs to enter Entrance Stairs-Rails: Right Entrance Stairs-Number of Steps: 5 Home Layout: One level Home Equipment: None      Prior Function Level of Independence: Independent               Hand Dominance        Extremity/Trunk Assessment   Upper Extremity Assessment: Defer to OT evaluation           Lower Extremity Assessment: RLE deficits/detail RLE Deficits / Details: Limited AROM hip flexion, knee flexion/ext due to pain. Ankle AROM WFl.       Communication   Communication: No difficulties  Cognition Arousal/Alertness: Awake/alert Behavior During Therapy: WFL for tasks assessed/performed Overall Cognitive Status: Within Functional Limits for tasks assessed  General Comments      Exercises Total Joint Exercises Ankle Circles/Pumps: Both;20 reps;Seated Quad Sets: Right;10 reps;Seated Gluteal Sets: Both;5 reps;Seated      Assessment/Plan    PT Assessment Patient needs continued PT  services  PT Diagnosis Acute pain;Difficulty walking   PT Problem List Decreased strength;Pain;Decreased range of motion;Decreased activity tolerance;Decreased balance;Decreased mobility;Decreased knowledge of use of DME  PT Treatment Interventions Gait training;Balance training;DME instruction;Stair training;Functional mobility training;Patient/family education;Therapeutic activities;Therapeutic exercise   PT Goals (Current goals can be found in the Care Plan section) Acute Rehab PT Goals Patient Stated Goal: none stated PT Goal Formulation: With patient Time For Goal Achievement: 09/29/14 Potential to Achieve Goals: Good    Frequency BID   Barriers to discharge        Co-evaluation               End of Session Equipment Utilized During Treatment: Gait belt Activity Tolerance: Patient tolerated treatment well;Other (comment) (limited by nausea, dizziness.) Patient left: in bed;with call bell/phone within reach;with nursing/sitter in room Nurse Communication: Mobility status         Time: 1450-1520 PT Time Calculation (min) (ACUTE ONLY): 30 min   Charges:   PT Evaluation $Initial PT Evaluation Tier I: 1 Procedure PT Treatments $Therapeutic Activity: 8-22 mins   PT G CodesCandy Sledge A Sep 29, 2014, 3:24 PM Wray Kearns PT, DPT [phone]

## 2014-09-15 NOTE — Progress Notes (Signed)
PT eval addendum- added G-codes    10-08-2014 1525  PT G-Codes **NOT FOR INPATIENT CLASS**  Functional Assessment Tool Used Clinical judgment  Functional Limitation Mobility: Walking and moving around  Mobility: Walking and Moving Around Current Status (Q3374) CJ  Mobility: Walking and Moving Around Goal Status (U5146) CI    Wray Kearns, PT, DPT [phone]

## 2014-09-15 NOTE — Anesthesia Preprocedure Evaluation (Signed)
Anesthesia Evaluation  Patient identified by MRN, date of birth, ID band Patient awake    Reviewed: Allergy & Precautions, NPO status , Patient's Chart, lab work & pertinent test results  Airway Mallampati: II  TM Distance: >3 FB Neck ROM: Full    Dental  (+) Teeth Intact, Dental Advisory Given   Pulmonary  breath sounds clear to auscultation        Cardiovascular hypertension, Rhythm:Regular Rate:Normal     Neuro/Psych    GI/Hepatic   Endo/Other    Renal/GU      Musculoskeletal   Abdominal   Peds  Hematology   Anesthesia Other Findings   Reproductive/Obstetrics                             Anesthesia Physical Anesthesia Plan  ASA: II  Anesthesia Plan: General   Post-op Pain Management: MAC Combined w/ Regional for Post-op pain   Induction: Intravenous  Airway Management Planned: LMA  Additional Equipment:   Intra-op Plan:   Post-operative Plan: Extubation in OR  Informed Consent: I have reviewed the patients History and Physical, chart, labs and discussed the procedure including the risks, benefits and alternatives for the proposed anesthesia with the patient or authorized representative who has indicated his/her understanding and acceptance.   Dental advisory given  Plan Discussed with: CRNA and Anesthesiologist  Anesthesia Plan Comments:         Anesthesia Quick Evaluation

## 2014-09-16 LAB — CBC
HCT: 32.2 % — ABNORMAL LOW (ref 36.0–46.0)
Hemoglobin: 10.4 g/dL — ABNORMAL LOW (ref 12.0–15.0)
MCH: 29.5 pg (ref 26.0–34.0)
MCHC: 32.3 g/dL (ref 30.0–36.0)
MCV: 91.5 fL (ref 78.0–100.0)
PLATELETS: 283 10*3/uL (ref 150–400)
RBC: 3.52 MIL/uL — ABNORMAL LOW (ref 3.87–5.11)
RDW: 14 % (ref 11.5–15.5)
WBC: 8.4 10*3/uL (ref 4.0–10.5)

## 2014-09-16 NOTE — Progress Notes (Signed)
Physical Therapy Treatment Patient Details Name: [name] MRN: [id number] DOB: 1961-01-01 Today's Date: 09/16/2014    History of Present Illness Patient is a 54 y/o female s/p Rt unicomparmental knee arthoplasty. PMH of HLD, HTN, blood clots, migraine, anxiety, back pain    PT Comments    Patient is making good progress with PT.  From a mobility standpoint anticipate patient will be ready for DC home tomorrow.  Pt demonstrated ability to ambulate 200 ft and ascend/descend 4 steps this session.  Pt c/o nausea again this session but it improved once ambulating.     Follow Up Recommendations  Home health PT;Supervision/Assistance - 24 hour     Equipment Recommendations  Rolling walker with 5" wheels    Recommendations for Other Services OT consult     Precautions / Restrictions Precautions Precautions: Knee;Fall Precaution Comments: Reviewed no pillow under knee Restrictions Weight Bearing Restrictions: Yes RLE Weight Bearing: Weight bearing as tolerated    Mobility  Bed Mobility Overal bed mobility: Modified Independent Bed Mobility: Supine to Sit     Supine to sit: Modified independent (Device/Increase time);HOB elevated     General bed mobility comments: leg hook technique, increased time due to nausea and pain, mod use of bed rails  Transfers Overall transfer level: Needs assistance Equipment used: Rolling walker (2 wheeled) Transfers: Sit to/from Stand Sit to Stand: Min guard         General transfer comment: Cues to push through BLEs to stand upright, good carryover from previous session for technique  Ambulation/Gait Ambulation/Gait assistance: Min guard Ambulation Distance (Feet): 200 Feet Assistive device: Rolling walker (2 wheeled) Gait Pattern/deviations: Step-to pattern;Step-through pattern;Antalgic;Trunk flexed;Decreased stride length;Decreased stance time - right   Gait velocity interpretation: Below normal speed for age/gender General Gait  Details: Trunk flexed, mod WB through BUEs to offload RLE,   Stairs Stairs: Yes Stairs assistance: Min guard Stair Management: One rail Right;Step to pattern;Sideways Number of Stairs: 2 (x2) General stair comments: Cues for proper technique.  Pt consistently does not leave enough space on step for two feet and instead brings following foot up into tandem stance during ascent/descent despite verbal cues.    Wheelchair Mobility    Modified Rankin (Stroke Patients Only)       Balance Overall balance assessment: Needs assistance Sitting-balance support: Bilateral upper extremity supported;Feet supported Sitting balance-Leahy Scale: Good     Standing balance support: Bilateral upper extremity supported;During functional activity Standing balance-Leahy Scale: Fair                      Cognition Arousal/Alertness: Awake/alert Behavior During Therapy: WFL for tasks assessed/performed Overall Cognitive Status: Within Functional Limits for tasks assessed                      Exercises Total Joint Exercises Long Arc Quad: AROM;Right;5 reps;Seated Knee Flexion: AROM;AAROM;Right;5 reps;Seated Goniometric ROM: 0-124    General Comments General comments (skin integrity, edema, etc.): Pt becomes hot and nauseous at beginning of session during exercises sitting EOB which improves and dissipates w/ ambulation and stair training.      Pertinent Vitals/Pain Pain Assessment: 0-10 Pain Score: 10-Worst pain ever Pain Location: R knee Pain Descriptors / Indicators: Aching;Moaning;Guarding;Heaviness;Grimacing Pain Intervention(s): Limited activity within patient's tolerance;Monitored during session;Repositioned;Patient requesting pain meds-RN notified;RN gave pain meds during session    Home Living  Prior Function            PT Goals (current goals can now be found in the care plan section) Acute Rehab PT Goals Patient Stated Goal: none  stated Progress towards PT goals: Progressing toward goals    Frequency  7X/week    PT Plan Current plan remains appropriate    Co-evaluation             End of Session Equipment Utilized During Treatment: Gait belt Activity Tolerance: Patient limited by pain (limited by nausea) Patient left: with call bell/phone within reach (on toilet, RN notified of pt's location)     Time: 1406-1430 PT Time Calculation (min) (ACUTE ONLY): 24 min  Charges:  $Gait Training: 8-22 mins $Therapeutic Exercise: 8-22 mins                    G Codes:      Joslyn Hy PT, [place] [phone] Pager: [phone] 09/16/2014, 3:20 PM

## 2014-09-16 NOTE — Progress Notes (Signed)
   Subjective:  Patient reports pain as severe o/n.    Objective:   VITALS:   Filed Vitals:   09/15/14 1750 09/15/14 2013 09/16/14 0057 09/16/14 0500  BP: 124/64 118/59 106/50 133/67  Pulse: 89 77 73 96  Temp: 98.7 F (37.1 C) 98.9 F (37.2 C) 98.5 F (36.9 C) 98.1 F (36.7 C)  TempSrc: Oral Oral Oral Oral  Resp: 14 15 14 15   SpO2: 98% 99% 100% 98%    Neurologically intact Neurovascular intact Sensation intact distally Intact pulses distally Dorsiflexion/Plantar flexion intact Incision: dressing C/D/I and no drainage No cellulitis present Compartment soft   Lab Results  Component Value Date   WBC 8.4 09/16/2014   HGB 10.4* 09/16/2014   HCT 32.2* 09/16/2014   MCV 91.5 09/16/2014   PLT 283 09/16/2014     Assessment/Plan:  1 Day Post-Op   - Expected postop acute blood loss anemia - will monitor for symptoms - Up with PT/OT - DVT ppx - SCDs, ambulation, lovenox - WBAT right lower extremity - Pain control - toradol, oxycontin - Discharge planning - possibly sat, more likely sun  Marianna Payment 09/16/2014, 8:51 AM [phone]

## 2014-09-16 NOTE — Progress Notes (Signed)
UR completed 

## 2014-09-16 NOTE — Care Management Note (Signed)
Case Management Note  Patient Details  Name: [name] MRN: [id number] Date of Birth: December 26, 1960  Subjective/Objective:   54 yr old female admitted with osteoarthritis of her right knee. Patient underwent a right  unicompartmental knee arthroplasty.                 Action/Plan:     Case manager spoke with patient concerning Home health and DME needs. Referral called to [place], [facility] Liaison.   Expected Discharge Date:  09/17/2014                Expected Discharge Plan:  [place]  In-House Referral:  NA  Discharge planning Services  CM Consult  Post Acute Care Choice:  Durable Medical Equipment, Home Health Choice offered to:  Patient  DME Arranged:  3-N-1, Walker rolling DME Agency:  [place]:  PT [place]:  [place]  Status of Service:  Completed, signed off  Medicare Important Message Given:    Date Medicare IM Given:    Medicare IM give by:    Date Additional Medicare IM Given:    Additional Medicare Important Message give by:     If discussed at [place] of Stay Meetings, dates discussed:    Additional Comments:  Ninfa Meeker, RN 09/16/2014, 9:41 AM

## 2014-09-17 LAB — CBC
HCT: 32.3 % — ABNORMAL LOW (ref 36.0–46.0)
Hemoglobin: 10.4 g/dL — ABNORMAL LOW (ref 12.0–15.0)
MCH: 29.3 pg (ref 26.0–34.0)
MCHC: 32.2 g/dL (ref 30.0–36.0)
MCV: 91 fL (ref 78.0–100.0)
Platelets: 292 10*3/uL (ref 150–400)
RBC: 3.55 MIL/uL — ABNORMAL LOW (ref 3.87–5.11)
RDW: 13.8 % (ref 11.5–15.5)
WBC: 9.7 10*3/uL (ref 4.0–10.5)

## 2014-09-17 NOTE — Progress Notes (Signed)
Referral received for SNF. Chart reviewed and PT Evaluation ndicates that patient is for DC to home with Home Health and DME.  CSW to sign off. Please re-consult if CSW needs arise. Thanks!   Lorie Phenix. [place], [place] (weekend coverage)

## 2014-09-17 NOTE — Progress Notes (Signed)
Physical Therapy Treatment Patient Details Name: [name] MRN: [id number] DOB: 05/13/1960 Today's Date: 09/17/2014    History of Present Illness Patient is a 54 y/o female s/p Rt unicomparmental knee arthoplasty. PMH of HLD, HTN, blood clots, migraine, anxiety, back pain    PT Comments    Patient progressing towards PT goals. Increased swelling present in right knee compared to yesterday limiting knee AROM. Required Min A for stair negotiation due to weakness/pain. Discussed having son assist with stair negotiation to safely enter home. Reviewed HEP. Will continue to follow and progress to maximize independence.  Follow Up Recommendations  Home health PT;Supervision/Assistance - 24 hour     Equipment Recommendations  Rolling walker with 5" wheels    Recommendations for Other Services       Precautions / Restrictions Precautions Precautions: Knee;Fall Precaution Booklet Issued: No Precaution Comments: Reviewed no pillow under knee and HEP Restrictions Weight Bearing Restrictions: Yes RLE Weight Bearing: Weight bearing as tolerated    Mobility  Bed Mobility Overal bed mobility: Modified Independent Bed Mobility: Supine to Sit     Supine to sit: HOB elevated;Modified independent (Device/Increase time)     General bed mobility comments: leg hook technique, increased time due to nausea and pain. No use of rails to simulate home.  Transfers Overall transfer level: Needs assistance Equipment used: Rolling walker (2 wheeled) Transfers: Sit to/from Stand Sit to Stand: Min guard         General transfer comment: Min guard for safety. Increased time due to stiffness/pain.  Ambulation/Gait Ambulation/Gait assistance: Min guard Ambulation Distance (Feet): 200 Feet Assistive device: Rolling walker (2 wheeled) Gait Pattern/deviations: Step-through pattern;Antalgic;Decreased stride length;Decreased stance time - right;Decreased stance time - left   Gait velocity  interpretation: Below normal speed for age/gender General Gait Details: Trunk flexed; cues for step through gait and increased knee flexion RLE during swing phase.    Stairs Stairs: Yes Stairs assistance: Min assist Stair Management: One rail Right;Step to pattern;Sideways Number of Stairs: 5 General stair comments: Cues for proper technique.  Pt consistently does not leave enough space on step for two feet despite cues. Min A for balance. INcreased time.  Wheelchair Mobility    Modified Rankin (Stroke Patients Only)       Balance Overall balance assessment: Needs assistance Sitting-balance support: Feet supported;No upper extremity supported Sitting balance-Leahy Scale: Good     Standing balance support: During functional activity Standing balance-Leahy Scale: Fair                      Cognition Arousal/Alertness: Awake/alert Behavior During Therapy: WFL for tasks assessed/performed Overall Cognitive Status: Within Functional Limits for tasks assessed                      Exercises Total Joint Exercises Ankle Circles/Pumps: Both;20 reps;Seated Quad Sets: Seated;10 reps;Right Goniometric ROM: 0-78    General Comments General comments (skin integrity, edema, etc.): Pt with + nausea during exertion however resolves.      Pertinent Vitals/Pain Pain Assessment: 0-10 Pain Score: 8  Pain Location: right knee Pain Descriptors / Indicators: Sore;Aching;Grimacing;Guarding Pain Intervention(s): Monitored during session;Repositioned;Premedicated before session    Home Living                      Prior Function            PT Goals (current goals can now be found in the care plan section) Progress towards PT  goals: Progressing toward goals    Frequency  7X/week    PT Plan Current plan remains appropriate    Co-evaluation             End of Session Equipment Utilized During Treatment: Gait belt Activity Tolerance: Patient limited  by pain Patient left: in chair;with call bell/phone within reach     Time: 0907-0943 PT Time Calculation (min) (ACUTE ONLY): 36 min  Charges:  $Gait Training: 8-22 mins $Therapeutic Exercise: 8-22 mins                    G CodesCandy Sledge A October 13, 2014, 10:27 AM Wray Kearns, PT, DPT [phone]

## 2014-09-17 NOTE — Discharge Instructions (Signed)
INSTRUCTIONS AFTER JOINT REPLACEMENT   o Remove items at home which could result in a fall. This includes throw rugs or furniture in walking pathways o ICE to the affected joint every three hours while awake for 30 minutes at a time, for at least the first 3-5 days, and then as needed for pain and swelling.  Continue to use ice for pain and swelling. You may notice swelling that will progress down to the foot and ankle.  This is normal after surgery.  Elevate your leg when you are not up walking on it.   o Continue to use the breathing machine you got in the hospital (incentive spirometer) which will help keep your temperature down.  It is common for your temperature to cycle up and down following surgery, especially at night when you are not up moving around and exerting yourself.  The breathing machine keeps your lungs expanded and your temperature down.   DIET:  As you were doing prior to hospitalization, we recommend a well-balanced diet.  DRESSING / WOUND CARE / SHOWERING  Change the surgical dressing 1 week after surgery.  May shower with the surgical dressing.  After 1 week, take off surgical dressing and place sterile gauze to incision.    ACTIVITY  o Increase activity slowly as tolerated, but follow the weight bearing instructions below.   o No driving for 6 weeks or until further direction given by your physician.  You cannot drive while taking narcotics.  o No lifting or carrying greater than 10 lbs. until further directed by your surgeon. o Avoid periods of inactivity such as sitting longer than an hour when not asleep. This helps prevent blood clots.  o You may return to work once you are authorized by your doctor.     WEIGHT BEARING   Weight bearing as tolerated with assist device (walker, cane, etc) as directed, use it as long as suggested by your surgeon or therapist, typically at least 4-6 weeks.   EXERCISES  Results after joint replacement surgery are often greatly  improved when you follow the exercise, range of motion and muscle strengthening exercises prescribed by your doctor. Safety measures are also important to protect the joint from further injury. Any time any of these exercises cause you to have increased pain or swelling, decrease what you are doing until you are comfortable again and then slowly increase them. If you have problems or questions, call your caregiver or physical therapist for advice.   Rehabilitation is important following a joint replacement. After just a few days of immobilization, the muscles of the leg can become weakened and shrink (atrophy).  These exercises are designed to build up the tone and strength of the thigh and leg muscles and to improve motion. Often times heat used for twenty to thirty minutes before working out will loosen up your tissues and help with improving the range of motion but do not use heat for the first two weeks following surgery (sometimes heat can increase post-operative swelling).   These exercises can be done on a training (exercise) mat, on the floor, on a table or on a bed. Use whatever works the best and is most comfortable for you.    Use music or television while you are exercising so that the exercises are a pleasant break in your day. This will make your life better with the exercises acting as a break in your routine that you can look forward to.   Perform all exercises about  fifteen times, three times per day or as directed.  You should exercise both the operative leg and the other leg as well.  Exercises include:    Quad Sets - Tighten up the muscle on the front of the thigh (Quad) and hold for 5-10 seconds.    Straight Leg Raises - With your knee straight (if you were given a brace, keep it on), lift the leg to 60 degrees, hold for 3 seconds, and slowly lower the leg.  Perform this exercise against resistance later as your leg gets stronger.   Leg Slides: Lying on your back, slowly slide your  foot toward your buttocks, bending your knee up off the floor (only go as far as is comfortable). Then slowly slide your foot back down until your leg is flat on the floor again.   Angel Wings: Lying on your back spread your legs to the side as far apart as you can without causing discomfort.   Hamstring Strength:  Lying on your back, push your heel against the floor with your leg straight by tightening up the muscles of your buttocks.  Repeat, but this time bend your knee to a comfortable angle, and push your heel against the floor.  You may put a pillow under the heel to make it more comfortable if necessary.   A rehabilitation program following joint replacement surgery can speed recovery and prevent re-injury in the future due to weakened muscles. Contact your doctor or a physical therapist for more information on knee rehabilitation.    CONSTIPATION  Constipation is defined medically as fewer than three stools per week and severe constipation as less than one stool per week.  Even if you have a regular bowel pattern at home, your normal regimen is likely to be disrupted due to multiple reasons following surgery.  Combination of anesthesia, postoperative narcotics, change in appetite and fluid intake all can affect your bowels.   YOU MUST use at least one of the following options; they are listed in order of increasing strength to get the job done.  They are all available over the counter, and you may need to use some, POSSIBLY even all of these options:    Drink plenty of fluids (prune juice may be helpful) and high fiber foods Colace 100 mg by mouth twice a day  Senokot for constipation as directed and as needed Dulcolax (bisacodyl), take with full glass of water  Miralax (polyethylene glycol) once or twice a day as needed.  If you have tried all these things and are unable to have a bowel movement in the first 3-4 days after surgery call either your surgeon or your primary doctor.    If  you experience loose stools or diarrhea, hold the medications until you stool forms back up.  If your symptoms do not get better within 1 week or if they get worse, check with your doctor.  If you experience "the worst abdominal pain ever" or develop nausea or vomiting, please contact the office immediately for further recommendations for treatment.   ITCHING:  If you experience itching with your medications, try taking only a single pain pill, or even half a pain pill at a time.  You can also use Benadryl over the counter for itching or also to help with sleep.   TED HOSE STOCKINGS:  Use stockings on both legs until for at least 2 weeks or as directed by physician office. They may be removed at night for sleeping.  MEDICATIONS:  See your medication summary on the After Visit Summary that nursing will review with you.  You may have some home medications which will be placed on hold until you complete the course of blood thinner medication.  It is important for you to complete the blood thinner medication as prescribed.  PRECAUTIONS:  If you experience chest pain or shortness of breath - call 911 immediately for transfer to the hospital emergency department.   If you develop a fever greater that 101 F, purulent drainage from wound, increased redness or drainage from wound, foul odor from the wound/dressing, or calf pain - CONTACT YOUR SURGEON.                                                   FOLLOW-UP APPOINTMENTS:  If you do not already have a post-op appointment, please call the office for an appointment to be seen by your surgeon.  Guidelines for how soon to be seen are listed in your After Visit Summary, but are typically between 1-4 weeks after surgery.  OTHER INSTRUCTIONS:   Knee Replacement:  Do not place pillow under knee, focus on keeping the knee straight while resting. CPM instructions: 0-90 degrees, 2 hours in the morning, 2 hours in the afternoon, and 2 hours in the evening. Place  foam block, curve side up under heel at all times except when in CPM or when walking.  DO NOT modify, tear, cut, or change the foam block in any way.  MAKE SURE YOU:   Understand these instructions.   Get help right away if you are not doing well or get worse.    Thank you for letting us be a part of your medical care team.  It is a privilege we respect greatly.  We hope these instructions will help you stay on track for a fast and full recovery!

## 2014-09-17 NOTE — Discharge Summary (Signed)
Physician Discharge Summary      Patient ID: [name] MRN: [id number] DOB/AGE: 1961/05/12 54 y.o.  Admit date: 09/15/2014 Discharge date: 09/17/2014  Admission Diagnoses:  <principal problem not specified>  Discharge Diagnoses:  Active Problems:   Primary osteoarthritis of right knee   Status post right partial knee replacement   Past Medical History  Diagnosis Date  . Hyperlipidemia     takes Atorvastatin daily  . Arthritis     right knee  . Paraesophageal hernia   . Acid reflux     takes Zantac and Omeprazole daily  . History of blood clots     66yrs ago and in left leg  . History of bronchitis 3+yrs ago  . History of migraine     has been a while since last one  . Weakness     numbness and tingling in both feet  . Joint pain   . Joint swelling   . Chronic back pain     DDD  . Hypertension     takes Lisinopril and HCTZ daily  . Insomnia     takes Elavil nightly as needed  . Anxiety     takes Citaopram daily    Surgeries: Procedure(s): RIGHT UNICOMPARTMENTAL KNEE ARTHROPLASTY on 09/15/2014   Consultants (if any):    Discharged Condition: Improved  Hospital Course: [name] is an 54 y.o. female who was admitted 09/15/2014 with a diagnosis of <principal problem not specified> and went to the operating room on 09/15/2014 and underwent the above named procedures.    She was given perioperative antibiotics:  Anti-infectives    Start     Dose/Rate Route Frequency Ordered Stop   09/15/14 1400  ceFAZolin (ANCEF) IVPB 2 g/50 mL premix     2 g 100 mL/hr over 30 Minutes Intravenous Every 6 hours 09/15/14 1308 09/15/14 2138   09/15/14 0700  ceFAZolin (ANCEF) IVPB 2 g/50 mL premix     2 g 100 mL/hr over 30 Minutes Intravenous To Surgery 09/14/14 1345 09/15/14 0743    .  She was given sequential compression devices, early ambulation, and lovenox for DVT prophylaxis.  She benefited maximally from the hospital stay and there were no complications.    Recent vital  signs:  Filed Vitals:   09/17/14 0618  BP: 146/72  Pulse: 90  Temp: 98.1 F (36.7 C)  Resp: 16    Recent laboratory studies:  Lab Results  Component Value Date   HGB 10.4* 09/17/2014   HGB 10.4* 09/16/2014   HGB 11.7* 09/15/2014   Lab Results  Component Value Date   WBC 9.7 09/17/2014   PLT 292 09/17/2014   Lab Results  Component Value Date   INR 0.95 09/07/2014   Lab Results  Component Value Date   NA 138 09/07/2014   K 3.9 09/07/2014   CL 107 09/07/2014   CO2 25 09/07/2014   BUN 8 09/07/2014   CREATININE 0.84 09/15/2014   GLUCOSE 91 09/07/2014    Discharge Medications:     Medication List    TAKE these medications        enoxaparin 30 MG/0.3ML injection  Commonly known as:  LOVENOX  Inject 0.3 mLs (30 mg total) into the skin every 12 (twelve) hours.     methocarbamol 750 MG tablet  Commonly known as:  ROBAXIN  Take 1 tablet (750 mg total) by mouth 2 (two) times daily as needed for muscle spasms.     oxyCODONE-acetaminophen 5-325 MG per tablet  Commonly known as:  PERCOCET  Take 1-2 tablets by mouth every 4 (four) hours as needed for severe pain.     senna-docusate 8.6-50 MG per tablet  Commonly known as:  SENOKOT S  Take 1 tablet by mouth at bedtime as needed.      ASK your doctor about these medications        aspirin EC 325 MG tablet  Take 1 tablet (325 mg total) by mouth 2 (two) times daily.     atorvastatin 10 MG tablet  Commonly known as:  LIPITOR  TAKE 1 TABLET BY MOUTH DAILY AT 6 PM.     citalopram 10 MG tablet  Commonly known as:  CELEXA  Take 1 tablet (10 mg total) by mouth daily.     hydrochlorothiazide 25 MG tablet  Commonly known as:  HYDRODIURIL  Take 1 tablet (25 mg total) by mouth daily.     lisinopril 40 MG tablet  Commonly known as:  PRINIVIL,ZESTRIL  Take 1 tablet (40 mg total) by mouth daily.     omeprazole 40 MG capsule  Commonly known as:  PRILOSEC  Take 40 mg by mouth 2 (two) times daily.     oxyCODONE 5 MG  immediate release tablet  Commonly known as:  Oxy IR/ROXICODONE  Take 1-3 tablets (5-15 mg total) by mouth every 4 (four) hours as needed.     ranitidine 150 MG tablet  Commonly known as:  ZANTAC  Take 150 mg by mouth 2 (two) times daily.     traMADol 50 MG tablet  Commonly known as:  ULTRAM  Take 1 tablet (50 mg total) by mouth every 6 (six) hours as needed.        Diagnostic Studies: Dg Knee Right Port  09/15/2014   CLINICAL DATA:  Post RIGHT partial knee replacement  EXAM: PORTABLE RIGHT KNEE - 1-2 VIEW  COMPARISON:  06/07/2014; correlation MRI RIGHT knee 06/08/2014  FINDINGS: Unicompartmental prosthetic components at medial compartment RIGHT knee.  Bones appear demineralized.  Lateral joint space preserved.  No acute fracture or dislocation.  IMPRESSION: Prosthetic components at medial compartment RIGHT knee without acute osseous findings.   Electronically Signed   By: Lavonia Dana M.D.   On: 09/15/2014 12:28    Disposition: 01-Home or Self Care        Follow-up Information    Follow up with Marianna Payment, MD In 2 weeks.   Specialty:  Orthopedic Surgery   Why:  For suture removal, For wound re-check   Contact information:   [place] [phone] [phone]       Follow up with [facility].   Why:  Someone from [facility] will contact you concerning start date and time for therapy.   Contact information:   [place] [address] [phone]        Signed: Marianna Payment 09/17/2014, 9:02 AM

## 2014-09-17 NOTE — Progress Notes (Signed)
   Subjective:  Patient reports pain as improved.   Objective:   VITALS:   Filed Vitals:   09/16/14 1437 09/16/14 1838 09/16/14 2040 09/17/14 0618  BP: 135/65 132/62 131/73 146/72  Pulse: 67 65 87 90  Temp: 98.4 F (36.9 C) 98.7 F (37.1 C) 98.5 F (36.9 C) 98.1 F (36.7 C)  TempSrc:   Oral Oral  Resp: 18 18 16 16   SpO2: 98% 99% 95% 94%    Dressing c/d/i NVI   Lab Results  Component Value Date   WBC 9.7 09/17/2014   HGB 10.4* 09/17/2014   HCT 32.3* 09/17/2014   MCV 91.0 09/17/2014   PLT 292 09/17/2014     Assessment/Plan:  2 Days Post-Op   - Hgb stable - up with PT today then dc home afterwards - Rx in chart  Marianna Payment 09/17/2014, 9:01 AM [phone]

## 2014-09-18 ENCOUNTER — Encounter (HOSPITAL_COMMUNITY): Payer: Self-pay | Admitting: Orthopaedic Surgery

## 2014-09-29 ENCOUNTER — Ambulatory Visit (HOSPITAL_COMMUNITY)
Admission: RE | Admit: 2014-09-29 | Discharge: 2014-09-29 | Disposition: A | Discharge status: Home / Self Care | Payer: No Typology Code available for payment source | Source: Ambulatory Visit | Attending: Orthopaedic Surgery | Admitting: Orthopaedic Surgery

## 2014-09-29 ENCOUNTER — Other Ambulatory Visit (HOSPITAL_COMMUNITY): Payer: Self-pay | Admitting: Orthopaedic Surgery

## 2014-09-29 DIAGNOSIS — R609 Edema, unspecified: Secondary | ICD-10-CM

## 2014-09-29 DIAGNOSIS — M7989 Other specified soft tissue disorders: Secondary | ICD-10-CM | POA: Insufficient documentation

## 2014-09-29 DIAGNOSIS — M79661 Pain in right lower leg: Secondary | ICD-10-CM | POA: Insufficient documentation

## 2014-09-29 NOTE — Progress Notes (Signed)
Right Lower Ext. Venous Duplex Completed. Negative for deep vein and superficial vein thrombosis.  Oda Cogan, BS, RDMS, RVT

## 2014-10-24 ENCOUNTER — Ambulatory Visit: Payer: MEDICAID

## 2014-10-26 ENCOUNTER — Ambulatory Visit: Payer: No Typology Code available for payment source | Admitting: Physical Therapy

## 2014-10-26 ENCOUNTER — Ambulatory Visit: Payer: No Typology Code available for payment source | Attending: Orthopaedic Surgery | Admitting: Physical Therapy

## 2014-10-26 ENCOUNTER — Encounter: Payer: Self-pay | Admitting: Physical Therapy

## 2014-10-26 DIAGNOSIS — R29898 Other symptoms and signs involving the musculoskeletal system: Secondary | ICD-10-CM | POA: Insufficient documentation

## 2014-10-26 DIAGNOSIS — M25661 Stiffness of right knee, not elsewhere classified: Secondary | ICD-10-CM | POA: Insufficient documentation

## 2014-10-26 DIAGNOSIS — M25561 Pain in right knee: Secondary | ICD-10-CM | POA: Insufficient documentation

## 2014-10-26 DIAGNOSIS — M25461 Effusion, right knee: Secondary | ICD-10-CM

## 2014-10-26 DIAGNOSIS — M7989 Other specified soft tissue disorders: Secondary | ICD-10-CM | POA: Insufficient documentation

## 2014-10-26 NOTE — Therapy (Signed)
[facility] [address] [place], [place], [address] Phone: [phone]   Fax:  [phone]  Physical Therapy Evaluation  Patient Details  Name: [name] MRN: [id number] Date of Birth: 12-10-1960 Referring Provider:  Leandrew Koyanagi, MD  Encounter Date: 10/26/2014      PT End of Session - 10/26/14 1613    Visit Number 1   Date for PT Re-Evaluation 12/07/14   PT Start Time 1601   PT Stop Time 1620   PT Time Calculation (min) 50 min   Activity Tolerance Patient tolerated treatment well   Behavior During Therapy [facility] for tasks assessed/performed      Past Medical History  Diagnosis Date  . Hyperlipidemia     takes Atorvastatin daily  . Arthritis     right knee  . Paraesophageal hernia   . Acid reflux     takes Zantac and Omeprazole daily  . History of blood clots     33yrs ago and in left leg  . History of bronchitis 3+yrs ago  . History of migraine     has been a while since last one  . Weakness     numbness and tingling in both feet  . Joint pain   . Joint swelling   . Chronic back pain     DDD  . Hypertension     takes Lisinopril and HCTZ daily  . Insomnia     takes Elavil nightly as needed  . Anxiety     takes Citaopram daily    Past Surgical History  Procedure Laterality Date  . Total knee arthroplasty Left 04/08/2004  . Ganglion cyst excision Left 01/13/2002  . Colonoscopy N/A 05/29/2014    Procedure: COLONOSCOPY;  Surgeon: Daneil Dolin, MD;  [facility];  Service: Endoscopy;  Laterality: N/A;  215pm- Pt is working until 12:00 so she can't come any earlier  . Esophagogastroduodenoscopy N/A 05/29/2014    Procedure: ESOPHAGOGASTRODUODENOSCOPY (EGD);  Surgeon: Daneil Dolin, MD;  [facility];  Service: Endoscopy;  Laterality: N/A;  Venia Minks dilation N/A 05/29/2014    Procedure: Venia Minks DILATION;  Surgeon: Daneil Dolin, MD;  [facility];  Service:  Endoscopy;  Laterality: N/A;  . Knee arthroscopy with medial menisectomy Right 06/28/2014    Procedure: RIGHT KNEE ARTHROSCOPY WITH PARTIAL MEDIAL MENISCECTOMY AND CHONDROPLASTY;  Surgeon: Marianna Payment, MD;  Location: [facility];  Service: Orthopedics;  Laterality: Right;  . Chondroplasty Right 06/28/2014    Procedure: CHONDROPLASTY;  Surgeon: Marianna Payment, MD;  Location: [facility];  Service: Orthopedics;  Laterality: Right;  . Partial knee arthroplasty Right 09/15/2014    Procedure: RIGHT UNICOMPARTMENTAL KNEE ARTHROPLASTY;  Surgeon: Leandrew Koyanagi, MD;  Location: [facility];  Service: Orthopedics;  Laterality: Right;    There were no vitals filed for this visit.  Visit Diagnosis:  Right knee pain - Plan: PT plan of care cert/re-cert  Knee stiffness, right - Plan: PT plan of care cert/re-cert  Weakness of right lower extremity - Plan: PT plan of care cert/re-cert  Swelling of right knee joint - Plan: PT plan of care cert/re-cert      Subjective Assessment - 10/26/14 1539    Subjective I had knee surgery but feeling well.    Limitations Walking   Patient Stated Goals become more functional   Currently in Pain? Yes   Pain Score 6    Pain Location  Knee   Pain Orientation Right   Pain Descriptors / Indicators Sharp   Pain Type Surgical pain   Pain Onset 1 to 4 weeks ago   Aggravating Factors  getting comfortable before bedtime; stairs   Pain Relieving Factors ice, meds, rest   Effect of Pain on Daily Activities limited in standing and walking   Multiple Pain Sites No            [facility] PT Assessment - 10/26/14 0001    Assessment   Medical Diagnosis s/p Rt knee arthroplasty   Onset Date/Surgical Date 09/15/14   Next MD Visit 11/02/2014   Prior Therapy home health 2 weeks   Precautions   Precautions None   Balance Screen   Has the patient fallen in the past 6 months No   Has the patient had a decrease in activity level because of a fear of  falling?  No   Is the patient reluctant to leave their home because of a fear of falling?  No   Home Ecologist residence   Home Access Stairs to enter   Entrance Stairs-Number of Steps 6   Entrance Stairs-Rails Can reach both   [place] One level   Prior Function   Level of Independence Independent   Vocation Full time employment   Vocation Requirements Pt works at [employer] and has to lift (1x/week), and can alternate between sitting and standing   Observation/Other Assessments   Focus on Therapeutic Outcomes (FOTO)  54% limitation   Observation/Other Assessments-Edema    Edema Circumferential   Circumferential Edema   Circumferential - Right 47cm   Circumferential - Left  45.2cm   AROM   Right Knee Extension -10  sitting   Right Knee Flexion 84   PROM   Right/Left Knee Right  flexion 105 extension -3   Strength   Right Knee Flexion 4-/5   Right Knee Extension 3+/5   Palpation   Patella mobility decreased medially   Palpation comment decreased scar mobility                   [facility] Adult PT Treatment/Exercise - 10/26/14 0001    Cryotherapy   Number Minutes Cryotherapy 15 Minutes   Cryotherapy Location Knee  elevated   Type of Cryotherapy Other (comment)  vasopnuematic device 3 snowflakes med. pressure                PT Education - 10/26/14 1614    Education provided Yes   Education Details knee red theraband exercise for knee flexion and extension and TKE   Person(s) Educated Patient   Methods Explanation;Demonstration;Tactile cues;Verbal cues;Handout   Comprehension Returned demonstration;Verbalized understanding          PT Short Term Goals - 10/26/14 1706    PT SHORT TERM GOAL #1   Title be independent with initial HEP   Time 3   Period Weeks   Status New   PT SHORT TERM GOAL #2   Title end of day right knee pain decreased >/= 25%   Time 3   Period Weeks   Status New   PT SHORT TERM GOAL #3   Title  sleep with >/= 25% decreased in right knee pain   Time 3   Period Weeks   Status New   PT SHORT TERM GOAL #4   Title right knee flexion AROM >/= 100 degrees   Time 3   Period Weeks   Status New  PT SHORT TERM GOAL #5   Title pain when going to sleep decreased >/= 25%   Time 3   Period Weeks   Status New           PT Long Term Goals - 10/26/14 1708    PT LONG TERM GOAL #1   Title be independent with HEP and understand how to progress herself   Time 6   Period Weeks   Status New   PT LONG TERM GOAL #2   Title pain when going to sleep decreased >/= 60%   Time 6   Period Weeks   Status New   PT LONG TERM GOAL #3   Title end of day pain decreased >/= 75%   Time 6   Period Weeks   Status New   PT LONG TERM GOAL #4   Title right knee strength >/= 4/5   Time 6   Period Weeks   Status New   PT LONG TERM GOAL #5   Title right knee flexion >/= AROM 115 degrees   Time 6   Period Weeks   Status New               Plan - 10/26/14 1659    Clinical Impression Statement Patient is a 54 year old female with diagnosis of right knee arthroplasty on 09/15/2014.  Patient has 2 weeks of home health physical therapy. Patient has decreased mobility of right patella and paspable tenderness located on bilateral sides of right knee. Patient reports her right knee pain is 6/10 intermiittently.  Right knee AROM is flexion 84 degrees and extension -10 degrees.  Right knee PROM is 3 degrees for extension and flexion is 105 degree.  Right knee strength is 3+ for extension and flexion 4-/5. Patient has pain with stairs, walking and toward the end of the day.  Patient has difficulty getting to sleep due to right knee pain.  FOTO score is 43% limitation.  Patient would benefit from physical therapy to improve right knee ROM and strength, decrease swelling and reduce pain.    Pt will benefit from skilled therapeutic intervention in order to improve on the following deficits Pain;Impaired  flexibility;Increased edema;Difficulty walking;Decreased activity tolerance;Decreased endurance;Decreased range of motion;Decreased strength;Increased muscle spasms;Decreased scar mobility;Decreased mobility   Rehab Potential Excellent   PT Frequency 2x / week   PT Duration 6 weeks   PT Treatment/Interventions ADLs/Self Care Home Management;Cryotherapy;Electrical Stimulation;Ultrasound;Moist Heat;Iontophoresis 4mg /ml Dexamethasone;Stair training;Functional mobility training;Therapeutic activities;Therapeutic exercise;Balance training;Neuromuscular re-education;Patient/family education;Scar mobilization;Passive range of motion;Vasopneumatic Device;Taping   PT Next Visit Plan ultrasound to bil. sides of right knee, bicycle, gastroc stretch, leg press, step ups, and soft tissue work   PT Home Exercise Plan gastroc and quad stretch   Consulted and Agree with Plan of Care Patient         Problem List Patient Active Problem List   Diagnosis Date Noted  . Status post right partial knee replacement 09/15/2014  . Acute medial meniscal injury of knee 06/14/2014  . Status post total knee replacement using cement 06/08/2014  . Primary osteoarthritis of right knee 06/08/2014  . Screening for colon cancer   . Schatzki's ring   . Hiatal hernia   . Dysphagia, pharyngoesophageal phase 05/26/2014  . Encounter for screening colonoscopy 05/26/2014  . Adrenal adenoma 05/01/2014  . Breast lump 05/01/2014  . Metatarsalgia of both feet 01/12/2014  . Bilateral leg pain 01/11/2014  . Bilateral edema of lower extremity 01/11/2014  . Other osteoarthritis of spine, thoracolumbar  region 12/26/2013  . Hypercholesteremia 05/19/2013  . Periodic health assessment, general screening, adult 04/13/2013  . GERD (gastroesophageal reflux disease) 04/13/2013  . HTN (hypertension) 04/13/2013  . Dyspnea 04/13/2013    GRAY,CHERYL,PT 10/26/2014, 5:13 PM  [facility] [address], [place], [place], [address] Phone: [phone]   Fax:  [phone]

## 2014-10-26 NOTE — Patient Instructions (Signed)
FLEXION: Sitting - Resistance Band (Active)   Sit with right leg extended. Against redKnee resistance band, bend knee and draw foot backward. Complete _3__ sets of _10__ repetitions. Perform __1_ sessions per day.  http://gtsc.exer.us/231   Copyright  VHI. All rights reserved.    Extension: Terminal - Standing (Single Leg)   Face anchor in shoulder width stance, band around knee. Allow tension of band to slightly bend knee. Pull leg back, straightening knee. Repeat _30_ times per set. Repeat with other leg. Do 1__ sets per session. Do _1_ sessions per week. Anchor Height: Knee  http://tub.exer.us/36   Copyright  VHI. All rights reserved.    EXTENSION: Sitting - Resistance Band (Active)   Sit with feet flat. Against red resistance band, straighten right knee. Complete _3__ sets of _10__ repetitions. Perform _1__ sessions per day.  Copyright  VHI. All rights reserved.  [facility] [address] [place], [place] [address] Phone # [phone] Fax [phone]

## 2014-10-31 ENCOUNTER — Encounter: Payer: No Typology Code available for payment source | Admitting: Physical Therapy

## 2014-11-02 ENCOUNTER — Encounter: Payer: No Typology Code available for payment source | Admitting: Physical Therapy

## 2014-11-07 ENCOUNTER — Ambulatory Visit: Payer: No Typology Code available for payment source | Admitting: Physical Therapy

## 2014-11-07 ENCOUNTER — Encounter: Payer: Self-pay | Admitting: Physical Therapy

## 2014-11-07 DIAGNOSIS — R29898 Other symptoms and signs involving the musculoskeletal system: Secondary | ICD-10-CM

## 2014-11-07 DIAGNOSIS — R609 Edema, unspecified: Secondary | ICD-10-CM

## 2014-11-07 DIAGNOSIS — M25661 Stiffness of right knee, not elsewhere classified: Secondary | ICD-10-CM

## 2014-11-07 DIAGNOSIS — M25461 Effusion, right knee: Secondary | ICD-10-CM

## 2014-11-07 DIAGNOSIS — M25561 Pain in right knee: Secondary | ICD-10-CM

## 2014-11-07 NOTE — Therapy (Addendum)
[facility] [address], [address] [place], [place], [address] Phone: [phone]   Fax:  [phone]  Physical Therapy Treatment  Patient Details  Name: [name] MRN: [id number] Date of Birth: 1960/06/12 Referring Provider:  Doris Cheadle, MD  Encounter Date: 11/07/2014      PT End of Session - 11/07/14 1615    Visit Number 2   Date for PT Re-Evaluation 12/07/14   PT Start Time 1532   PT Stop Time 1633   PT Time Calculation (min) 61 min   Activity Tolerance Patient tolerated treatment well   Behavior During Therapy [facility] for tasks assessed/performed      Past Medical History  Diagnosis Date  . Hyperlipidemia     takes Atorvastatin daily  . Arthritis     right knee  . Paraesophageal hernia   . Acid reflux     takes Zantac and Omeprazole daily  . History of blood clots     34yrs ago and in left leg  . History of bronchitis 3+yrs ago  . History of migraine     has been a while since last one  . Weakness     numbness and tingling in both feet  . Joint pain   . Joint swelling   . Chronic back pain     DDD  . Hypertension     takes Lisinopril and HCTZ daily  . Insomnia     takes Elavil nightly as needed  . Anxiety     takes Citaopram daily    Past Surgical History  Procedure Laterality Date  . Total knee arthroplasty Left 04/08/2004  . Ganglion cyst excision Left 01/13/2002  . Colonoscopy N/A 05/29/2014    Procedure: COLONOSCOPY;  Surgeon: Corbin Ade, MD;  [facility];  Service: Endoscopy;  Laterality: N/A;  215pm- Pt is working until 12:00 so she can't come any earlier  . Esophagogastroduodenoscopy N/A 05/29/2014    Procedure: ESOPHAGOGASTRODUODENOSCOPY (EGD);  Surgeon: Corbin Ade, MD;  [facility];  Service: Endoscopy;  Laterality: N/A;  [name] dilation N/A 05/29/2014    Procedure: [name] DILATION;  Surgeon: Corbin Ade, MD;  [facility];  Service:  Endoscopy;  Laterality: N/A;  . Knee arthroscopy with medial menisectomy Right 06/28/2014    Procedure: RIGHT KNEE ARTHROSCOPY WITH PARTIAL MEDIAL MENISCECTOMY AND CHONDROPLASTY;  Surgeon: Cheral Almas, MD;  Location: [facility];  Service: Orthopedics;  Laterality: Right;  . Chondroplasty Right 06/28/2014    Procedure: CHONDROPLASTY;  Surgeon: Cheral Almas, MD;  Location: [facility];  Service: Orthopedics;  Laterality: Right;  . Partial knee arthroplasty Right 09/15/2014    Procedure: RIGHT UNICOMPARTMENTAL KNEE ARTHROPLASTY;  Surgeon: Tarry Kos, MD;  Location[facility];  Service: Orthopedics;  Laterality: Right;    There were no vitals filed for this visit.  Visit Diagnosis:  Right knee pain  Knee stiffness, right  Weakness of right lower extremity  Swelling of right knee joint  Knee pain, acute, right  Edema      Subjective Assessment - 11/07/14 1546    Subjective The knee feels good, after lunchtime it feels usually sore, and very difficult to negotiate stairs    Limitations Walking   Currently in Pain? Yes   Pain Score 6    Pain Location Knee   Pain Orientation Right   Pain Descriptors / Indicators Sharp   Pain Type Surgical pain  Pain Onset 1 to 4 weeks ago   Pain Frequency Constant   Multiple Pain Sites No                         [facility] Adult PT Treatment/Exercise - 11/07/14 0001    Knee/Hip Exercises: Aerobic   Stationary Bike L1 x 9 initially rocking, than backwards and last 2 min forward   Knee/Hip Exercises: Standing   Lateral Step Up 20 reps;Both;Hand Hold: 2   Forward Step Up 20 reps;Hand Hold: 2;Both   Rebounder weight shifting 3 ways 1 min each   Modalities   Modalities Vasopneumatic   Vasopneumatic   Number Minutes Vasopneumatic  15 minutes   Vasopnuematic Location  Knee  Rt   Vasopneumatic Pressure Medium   Vasopneumatic Temperature  3 snowflakes, med compression   Manual Therapy   Manual  Therapy Soft tissue mobilization   Manual therapy comments --  to Rt knee with focus on med. hamstrings                  PT Short Term Goals - 11/07/14 1618    PT SHORT TERM GOAL #1   Title be independent with initial HEP   Time 3   Period Weeks   Status On-going   PT SHORT TERM GOAL #2   Title end of day right knee pain decreased >/= 25%   Time 3   Period Weeks   Status On-going   PT SHORT TERM GOAL #3   Title sleep with >/= 25% decreased in right knee pain   Time 3   Period Weeks   Status On-going   PT SHORT TERM GOAL #4   Title right knee flexion AROM >/= 100 degrees   Time 3   Period Weeks   Status On-going   PT SHORT TERM GOAL #5   Title pain when going to sleep decreased >/= 25%   Time 3   Period Weeks   Status On-going           PT Long Term Goals - 10/26/14 1708    PT LONG TERM GOAL #1   Title be independent with HEP and understand how to progress herself   Time 6   Period Weeks   Status New   PT LONG TERM GOAL #2   Title pain when going to sleep decreased >/= 60%   Time 6   Period Weeks   Status New   PT LONG TERM GOAL #3   Title end of day pain decreased >/= 75%   Time 6   Period Weeks   Status New   PT LONG TERM GOAL #4   Title right knee strength >/= 4/5   Time 6   Period Weeks   Status New   PT LONG TERM GOAL #5   Title right knee flexion >/= AROM 115 degrees   Time 6   Period Weeks   Status New               Plan - 11/07/14 1615    Clinical Impression Statement Pt is 54 year old female with diagnosis of Rt knee arthroplasty on 09/15/2014. Pt with edema, limited ROM, decreased mobility of Rt patella and palpable tenderness bil sides of Rt knee and weakness.   Pt will benefit from skilled therapeutic intervention in order to improve on the following deficits Pain;Impaired flexibility;Increased edema;Difficulty walking;Decreased activity tolerance;Decreased endurance;Decreased range of motion;Decreased strength;Increased  muscle spasms;Decreased scar mobility;Decreased  mobility   Rehab Potential Excellent   PT Frequency 2x / week   PT Duration 6 weeks   PT Treatment/Interventions ADLs/Self Care Home Management;Cryotherapy;Electrical Stimulation;Ultrasound;Moist Heat;Iontophoresis 4mg /ml Dexamethasone;Stair training;Functional mobility training;Therapeutic activities;Therapeutic exercise;Balance training;Neuromuscular re-education;Patient/family education;Scar mobilization;Passive range of motion;Vasopneumatic Device;Taping   PT Next Visit Plan May try [place], continue STW, leg press   Consulted and Agree with Plan of Care Patient        Problem List Patient Active Problem List   Diagnosis Date Noted  . Status post right partial knee replacement 09/15/2014  . Acute medial meniscal injury of knee 06/14/2014  . Status post total knee replacement using cement 06/08/2014  . Primary osteoarthritis of right knee 06/08/2014  . Screening for colon cancer   . Schatzki's ring   . Hiatal hernia   . Dysphagia, pharyngoesophageal phase 05/26/2014  . Encounter for screening colonoscopy 05/26/2014  . Adrenal adenoma 05/01/2014  . Breast lump 05/01/2014  . Metatarsalgia of both feet 01/12/2014  . Bilateral leg pain 01/11/2014  . Bilateral edema of lower extremity 01/11/2014  . Other osteoarthritis of spine, thoracolumbar region 12/26/2013  . Hypercholesteremia 05/19/2013  . Periodic health assessment, general screening, adult 04/13/2013  . GERD (gastroesophageal reflux disease) 04/13/2013  . HTN (hypertension) 04/13/2013  . Dyspnea 04/13/2013    NAUMANN-HOUEGNIFIO,Nasia Cannan PTA 11/07/2014, 4:22 PM  [facility] [address] [place], [place], [address] Phone: [phone]   Fax:  [phone]     PHYSICAL THERAPY DISCHARGE SUMMARY  Visits from Start of Care: 2  Current functional level related to goals / functional outcomes: See above.  Patient did  not return therefore unable to reassess.   Remaining deficits: See above   Education / Equipment: HEP  Plan: Patient agrees to discharge.  Patient goals were not met. Patient is being discharged due to not returning since the last visit. Thank you for the referral. Eulis Foster, PT 03/28/2015 3:35 PM   ?????

## 2014-11-09 ENCOUNTER — Ambulatory Visit: Payer: No Typology Code available for payment source | Attending: Internal Medicine

## 2014-11-14 ENCOUNTER — Encounter: Payer: No Typology Code available for payment source | Admitting: Physical Therapy

## 2014-11-15 ENCOUNTER — Ambulatory Visit: Payer: No Typology Code available for payment source | Attending: Internal Medicine | Admitting: Internal Medicine

## 2014-11-15 ENCOUNTER — Encounter: Payer: Self-pay | Admitting: Internal Medicine

## 2014-11-15 VITALS — BP 132/78 | HR 98 | Temp 98.1°F | Resp 18 | Ht 64.0 in | Wt 206.2 lb

## 2014-11-15 DIAGNOSIS — R232 Flushing: Secondary | ICD-10-CM

## 2014-11-15 DIAGNOSIS — D649 Anemia, unspecified: Secondary | ICD-10-CM

## 2014-11-15 DIAGNOSIS — I10 Essential (primary) hypertension: Secondary | ICD-10-CM

## 2014-11-15 DIAGNOSIS — M1990 Unspecified osteoarthritis, unspecified site: Secondary | ICD-10-CM

## 2014-11-15 DIAGNOSIS — M2550 Pain in unspecified joint: Secondary | ICD-10-CM

## 2014-11-15 DIAGNOSIS — N951 Menopausal and female climacteric states: Secondary | ICD-10-CM

## 2014-11-15 LAB — URIC ACID: Uric Acid, Serum: 4.6 mg/dL (ref 2.4–7.0)

## 2014-11-15 MED ORDER — CITALOPRAM HYDROBROMIDE 10 MG PO TABS: 10.0000 mg | ORAL_TABLET | Freq: Every day | ORAL | Status: DC

## 2014-11-15 MED ORDER — GABAPENTIN 100 MG PO CAPS: 100.0000 mg | ORAL_CAPSULE | Freq: Three times a day (TID) | ORAL | Status: DC

## 2014-11-15 NOTE — Progress Notes (Signed)
MRN: [id number] Name: [name]  Sex: female Age: 54 y.o. DOB: June 24, 1960  Allergies: Review of patient's allergies indicates no known allergies.  Chief Complaint  Patient presents with  . Follow-up    HPI: Patient is 54 y.o. female who history of hypertension, arthritis of the knees, 2 months ago patient underwent right knee surgery and currently following up with physical therapy and orthopedics, she's also complaining of multiple joint pain, denies any fever chills rash, not sure about family history of lupus or rheumatoid arthritis, she also has been taking citalopram which helps her with her hot flushes but she is taking half a pill and thinks it's not helping, denies any SI or HI. Previous blood work reviewed noticed anemia likely secondary to surgery, patient denies any bleeding, has already been referred to GI for screening colonoscopy.  Past Medical History  Diagnosis Date  . Hyperlipidemia     takes Atorvastatin daily  . Arthritis     right knee  . Paraesophageal hernia   . Acid reflux     takes Zantac and Omeprazole daily  . History of blood clots     54yrs ago and in left leg  . History of bronchitis 3+yrs ago  . History of migraine     has been a while since last one  . Weakness     numbness and tingling in both feet  . Joint pain   . Joint swelling   . Chronic back pain     DDD  . Hypertension     takes Lisinopril and HCTZ daily  . Insomnia     takes Elavil nightly as needed  . Anxiety     takes Citaopram daily    Past Surgical History  Procedure Laterality Date  . Total knee arthroplasty Left 04/08/2004  . Ganglion cyst excision Left 01/13/2002  . Colonoscopy N/A 05/29/2014    Procedure: COLONOSCOPY;  Surgeon: Daneil Dolin, MD;  [facility];  Service: Endoscopy;  Laterality: N/A;  215pm- Pt is working until 12:00 so she can't come any earlier  . Esophagogastroduodenoscopy N/A 05/29/2014    Procedure: ESOPHAGOGASTRODUODENOSCOPY (EGD);   Surgeon: Daneil Dolin, MD;  [facility];  Service: Endoscopy;  Laterality: N/A;  Venia Minks dilation N/A 05/29/2014    Procedure: Venia Minks DILATION;  Surgeon: Daneil Dolin, MD;  [facility];  Service: Endoscopy;  Laterality: N/A;  . Knee arthroscopy with medial menisectomy Right 06/28/2014    Procedure: RIGHT KNEE ARTHROSCOPY WITH PARTIAL MEDIAL MENISCECTOMY AND CHONDROPLASTY;  Surgeon: Marianna Payment, MD;  Location: [facility];  Service: Orthopedics;  Laterality: Right;  . Chondroplasty Right 06/28/2014    Procedure: CHONDROPLASTY;  Surgeon: Marianna Payment, MD;  Location: [facility];  Service: Orthopedics;  Laterality: Right;  . Partial knee arthroplasty Right 09/15/2014    Procedure: RIGHT UNICOMPARTMENTAL KNEE ARTHROPLASTY;  Surgeon: Leandrew Koyanagi, MD;  Location: [facility];  Service: Orthopedics;  Laterality: Right;      Medication List       This list is accurate as of: 11/15/14  4:10 PM.  Always use your most recent med list.               aspirin EC 325 MG tablet  Take 1 tablet (325 mg total) by mouth 2 (two) times daily.     atorvastatin 10 MG tablet  Commonly known as:  LIPITOR  TAKE 1 TABLET BY MOUTH DAILY  AT 6 PM.     citalopram 10 MG tablet  Commonly known as:  CELEXA  Take 1 tablet (10 mg total) by mouth daily.     enoxaparin 30 MG/0.3ML injection  Commonly known as:  LOVENOX  Inject 0.3 mLs (30 mg total) into the skin every 12 (twelve) hours.     gabapentin 100 MG capsule  Commonly known as:  NEURONTIN  Take 1 capsule (100 mg total) by mouth 3 (three) times daily.     hydrochlorothiazide 25 MG tablet  Commonly known as:  HYDRODIURIL  Take 1 tablet (25 mg total) by mouth daily.     lisinopril 40 MG tablet  Commonly known as:  PRINIVIL,ZESTRIL  Take 1 tablet (40 mg total) by mouth daily.     methocarbamol 750 MG tablet  Commonly known as:  ROBAXIN  Take 1 tablet (750 mg total) by mouth 2 (two) times daily  as needed for muscle spasms.     oxyCODONE 5 MG immediate release tablet  Commonly known as:  Oxy IR/ROXICODONE  Take 1-3 tablets (5-15 mg total) by mouth every 4 (four) hours as needed.     oxyCODONE-acetaminophen 5-325 MG per tablet  Commonly known as:  PERCOCET  Take 1-2 tablets by mouth every 4 (four) hours as needed for severe pain.     ranitidine 150 MG tablet  Commonly known as:  ZANTAC  Take 150 mg by mouth 2 (two) times daily.     senna-docusate 8.6-50 MG per tablet  Commonly known as:  SENOKOT S  Take 1 tablet by mouth at bedtime as needed.        Meds ordered this encounter  Medications  . citalopram (CELEXA) 10 MG tablet    Sig: Take 1 tablet (10 mg total) by mouth daily.    Dispense:  30 tablet    Refill:  3  . gabapentin (NEURONTIN) 100 MG capsule    Sig: Take 1 capsule (100 mg total) by mouth 3 (three) times daily.    Dispense:  90 capsule    Refill:  3    Immunization History  Administered Date(s) Administered  . Tdap 04/29/2013    Family History  Problem Relation Age of Onset  . Cancer Father     lung  . Breast cancer Sister   . Cancer Maternal Grandfather     lung  . Cancer Paternal Grandfather     lung  . Colon cancer Neg Hx     History  Substance Use Topics  . Smoking status: Never Smoker   . Smokeless tobacco: Never Used  . Alcohol Use: No    Review of Systems   As noted in HPI  Filed Vitals:   11/15/14 1542  BP: 132/78  Pulse: 98  Temp: 98.1 F (36.7 C)  Resp: 18    Physical Exam  Physical Exam  Constitutional: No distress.  Eyes: EOM are normal. Pupils are equal, round, and reactive to light.  Cardiovascular: Normal rate and regular rhythm.   Pulmonary/Chest: Breath sounds normal. No respiratory distress. She has no wheezes. She has no rales.  Musculoskeletal: She exhibits no edema.    CBC    Component Value Date/Time   WBC 9.7 09/17/2014 0455   RBC 3.55* 09/17/2014 0455   HGB 10.4* 09/17/2014 0455   HCT  32.3* 09/17/2014 0455   PLT 292 09/17/2014 0455   MCV 91.0 09/17/2014 0455   LYMPHSABS 1.2 09/07/2014 1454   MONOABS 0.5 09/07/2014 1454   EOSABS  0.0 09/07/2014 1454   BASOSABS 0.0 09/07/2014 1454    CMP     Component Value Date/Time   NA 138 09/07/2014 1454   K 3.9 09/07/2014 1454   CL 107 09/07/2014 1454   CO2 25 09/07/2014 1454   GLUCOSE 91 09/07/2014 1454   BUN 8 09/07/2014 1454   CREATININE 0.84 09/15/2014 1415   CREATININE 0.96 07/05/2014 1138   CALCIUM 9.1 09/07/2014 1454   PROT 6.7 09/07/2014 1454   ALBUMIN 3.6 09/07/2014 1454   AST 25 09/07/2014 1454   ALT 25 09/07/2014 1454   ALKPHOS 102 09/07/2014 1454   BILITOT 0.5 09/07/2014 1454   GFRNONAA >60 09/15/2014 1415   GFRNONAA 68 07/05/2014 1138   GFRAA >60 09/15/2014 1415   GFRAA 79 07/05/2014 1138    Lab Results  Component Value Date/Time   CHOL 252* 04/13/2013 12:42 PM    No results found for: HGBA1C  Lab Results  Component Value Date/Time   AST 25 09/07/2014 02:54 PM    Assessment and Plan  Essential hypertension Blood pressure is well-controlled continued current meds  Arthritis Of the knee status post surgery currently following up with orthopedics  Multiple joint pain - Plan: Cyclic citrul peptide antibody, IgG, ANA, Uric acid, gabapentin (NEURONTIN) 100 MG capsule  Anemia, unspecified anemia type - Plan: will check Anemia panel  Hot flashes - Plan:increased the dose of Celexa to 10 mg , citalopram (CELEXA) 10 MG tablet   Health Maintenance -Colonoscopy: patient has already been referred to GI   Return in about 3 months (around 02/15/2015), or if symptoms worsen or fail to improve.   This note has been created with Surveyor, quantity. Any transcriptional errors are unintentional.    Lorayne Marek, MD

## 2014-11-15 NOTE — Progress Notes (Signed)
Patient here for follow up.  Patient complaining of pain "all over" but worse "from the waist down."  Patient states it is arthritic-pain, 10/10, constant with no relief.  Reviewed medications with patient.  Patient states she is only taking 4 medications at this time: Celexa, Hydrochlorothiazide, Lisinopril, and Omeprazole.

## 2014-11-16 ENCOUNTER — Encounter: Payer: No Typology Code available for payment source | Admitting: Physical Therapy

## 2014-11-16 LAB — ANEMIA PANEL
%SAT: 9 % — ABNORMAL LOW (ref 20–55)
ABS RETIC: 39.6 10*3/uL (ref 19.0–186.0)
Ferritin: 27 ng/mL (ref 10–291)
Folate: 6.7 ng/mL
IRON: 34 ug/dL — AB (ref 42–145)
RBC.: 3.96 MIL/uL (ref 3.87–5.11)
RETIC CT PCT: 1 % (ref 0.4–2.3)
TIBC: 377 ug/dL (ref 250–470)
UIBC: 343 ug/dL (ref 125–400)
Vitamin B-12: 266 pg/mL (ref 211–911)

## 2014-11-16 LAB — ANTI-NUCLEAR AB-TITER (ANA TITER): ANA Titer 1: NEGATIVE

## 2014-11-16 LAB — ANA: ANA: POSITIVE — AB

## 2014-11-16 LAB — CYCLIC CITRUL PEPTIDE ANTIBODY, IGG: Cyclic Citrullin Peptide Ab: <2 U/mL (ref 0.0–5.0)

## 2014-11-17 ENCOUNTER — Telehealth: Payer: Self-pay

## 2014-11-17 NOTE — Telephone Encounter (Signed)
-----   Message from Lorayne Marek, MD sent at 11/17/2014  1:23 PM EDT ----- Call and let patient know that her blood work shows borderline low  vitamin B 12 level, advise patient to start taking over-the-counter cyanocobalamin 1000 mcg daily, blood work is negative for rheumatoid arthritis or gout,   noted her ANA test positive with a negative ANA titer ( ? False positive) will consider repeating the test in 3-4 months.

## 2014-11-17 NOTE — Telephone Encounter (Signed)
Patient not available Unable to leave message on house phone No answering machine hooked up

## 2014-11-21 ENCOUNTER — Ambulatory Visit: Payer: No Typology Code available for payment source | Admitting: Physical Therapy

## 2014-11-21 ENCOUNTER — Telehealth: Payer: Self-pay | Admitting: Internal Medicine

## 2014-11-21 NOTE — Telephone Encounter (Signed)
Patient called to request blood work results, please f/u with pt.

## 2014-11-24 NOTE — Telephone Encounter (Signed)
Patient called to request blood work results, please f/u with pt.

## 2014-11-24 NOTE — Telephone Encounter (Signed)
Pt has been calling for results

## 2014-11-24 NOTE — Telephone Encounter (Signed)
Patient called to request blood work results. Please f/u with pt.

## 2014-11-27 ENCOUNTER — Telehealth: Payer: Self-pay | Admitting: Internal Medicine

## 2014-11-27 NOTE — Telephone Encounter (Signed)
Pt called because she would like to speak to Elray Mcgregor about her service at [facility] She is at work today until 2 pm [phone] and after 2 pm call her at [phone]. jw

## 2014-11-28 ENCOUNTER — Telehealth: Payer: Self-pay

## 2014-11-28 ENCOUNTER — Encounter: Payer: No Typology Code available for payment source | Admitting: Physical Therapy

## 2014-11-28 NOTE — Telephone Encounter (Signed)
Pt called again. She has not reviecied any phone calls about her blood work results on July 6. She is planning to file a complaint about the lack of service

## 2014-11-28 NOTE — Telephone Encounter (Signed)
Patient is calling to request blood work results, please f/u with pt.

## 2014-11-28 NOTE — Telephone Encounter (Signed)
Returned patient phone call to number [phone] Patient not available Left message on voice mail to return our call

## 2014-11-28 NOTE — Telephone Encounter (Signed)
Patient is aware of her lab results Patient did state she was "hot" about not getting return call and went above [facility] i did inform patient that we called on 11/17/14 and we were unable to leave a message due to no Answering machine hooked to line

## 2014-11-29 ENCOUNTER — Telehealth: Payer: Self-pay | Admitting: *Deleted

## 2014-11-29 NOTE — Telephone Encounter (Signed)
Called patient to advise not showing that she has been seen by a [place] in the hospital nor outpatient. Pt was seen by Dr. Frankey Shown in the hospital, maybe confused with Dr. Rosalin Hawking here at [facility]. No answer will try later.

## 2014-11-30 ENCOUNTER — Encounter: Payer: No Typology Code available for payment source | Admitting: Physical Therapy

## 2014-11-30 NOTE — Telephone Encounter (Signed)
Spoke to patient. Explained confusion. Patient verbalized understanding. Provided Dr. Marianna Payment phone number.

## 2014-12-11 ENCOUNTER — Ambulatory Visit: Payer: No Typology Code available for payment source | Attending: Internal Medicine | Admitting: Internal Medicine

## 2014-12-11 ENCOUNTER — Encounter: Payer: Self-pay | Admitting: Internal Medicine

## 2014-12-11 VITALS — BP 136/88 | HR 84 | Temp 98.0°F | Resp 16 | Ht 64.0 in | Wt 202.0 lb

## 2014-12-11 DIAGNOSIS — I10 Essential (primary) hypertension: Secondary | ICD-10-CM | POA: Insufficient documentation

## 2014-12-11 DIAGNOSIS — F419 Anxiety disorder, unspecified: Secondary | ICD-10-CM | POA: Insufficient documentation

## 2014-12-11 DIAGNOSIS — M7918 Myalgia, other site: Secondary | ICD-10-CM

## 2014-12-11 DIAGNOSIS — E785 Hyperlipidemia, unspecified: Secondary | ICD-10-CM | POA: Insufficient documentation

## 2014-12-11 DIAGNOSIS — M791 Myalgia: Secondary | ICD-10-CM | POA: Insufficient documentation

## 2014-12-11 DIAGNOSIS — Z7982 Long term (current) use of aspirin: Secondary | ICD-10-CM | POA: Insufficient documentation

## 2014-12-11 DIAGNOSIS — Z79899 Other long term (current) drug therapy: Secondary | ICD-10-CM | POA: Insufficient documentation

## 2014-12-11 DIAGNOSIS — K219 Gastro-esophageal reflux disease without esophagitis: Secondary | ICD-10-CM | POA: Insufficient documentation

## 2014-12-11 MED ORDER — DULOXETINE HCL 20 MG PO CPEP: 20.0000 mg | ORAL_CAPSULE | Freq: Every day | ORAL | Status: DC

## 2014-12-11 NOTE — Progress Notes (Signed)
F/U Medication not working -Gabapentin  Still with pain worsen with walkin Pain scale #8

## 2014-12-11 NOTE — Progress Notes (Signed)
MRN: [id number] Name: RUTHE ROEMER  Sex: female Age: 54 y.o. DOB: October 26, 1960  Allergies: Review of patient's allergies indicates no known allergies.  Chief Complaint  Patient presents with  . Follow-up    medication     HPI: Patient is 53 y.o. female who history of arthritis, musculoskeletal pain, she recently had a blood work done which was reviewed with the patient her he was negative for rheumatoid arthritis, gout, ANA was positive but that was negative ? False positive results, she was started on Neurontin, as per patient it is not helping that much, also noted her vitamin B12 is in low normal range also patient has history of anemia with low iron saturation.  Past Medical History  Diagnosis Date  . Hyperlipidemia     takes Atorvastatin daily  . Arthritis     right knee  . Paraesophageal hernia   . Acid reflux     takes Zantac and Omeprazole daily  . History of blood clots     15yrs ago and in left leg  . History of bronchitis 3+yrs ago  . History of migraine     has been a while since last one  . Weakness     numbness and tingling in both feet  . Joint pain   . Joint swelling   . Chronic back pain     DDD  . Hypertension     takes Lisinopril and HCTZ daily  . Insomnia     takes Elavil nightly as needed  . Anxiety     takes Citaopram daily    Past Surgical History  Procedure Laterality Date  . Total knee arthroplasty Left 04/08/2004  . Ganglion cyst excision Left 01/13/2002  . Colonoscopy N/A 05/29/2014    Procedure: COLONOSCOPY;  Surgeon: Daneil Dolin, MD;  [facility];  Service: Endoscopy;  Laterality: N/A;  215pm- Pt is working until 12:00 so she can't come any earlier  . Esophagogastroduodenoscopy N/A 05/29/2014    Procedure: ESOPHAGOGASTRODUODENOSCOPY (EGD);  Surgeon: Daneil Dolin, MD;  [facility];  Service: Endoscopy;  Laterality: N/A;  Venia Minks dilation N/A 05/29/2014    Procedure: Venia Minks DILATION;  Surgeon: Daneil Dolin,  MD;  [facility];  Service: Endoscopy;  Laterality: N/A;  . Knee arthroscopy with medial menisectomy Right 06/28/2014    Procedure: RIGHT KNEE ARTHROSCOPY WITH PARTIAL MEDIAL MENISCECTOMY AND CHONDROPLASTY;  Surgeon: Marianna Payment, MD;  Location: [facility];  Service: Orthopedics;  Laterality: Right;  . Chondroplasty Right 06/28/2014    Procedure: CHONDROPLASTY;  Surgeon: Marianna Payment, MD;  Location: [facility];  Service: Orthopedics;  Laterality: Right;  . Partial knee arthroplasty Right 09/15/2014    Procedure: RIGHT UNICOMPARTMENTAL KNEE ARTHROPLASTY;  Surgeon: Leandrew Koyanagi, MD;  Location: [facility];  Service: Orthopedics;  Laterality: Right;      Medication List       This list is accurate as of: 12/11/14  3:13 PM.  Always use your most recent med list.               aspirin EC 325 MG tablet  Take 1 tablet (325 mg total) by mouth 2 (two) times daily.     atorvastatin 10 MG tablet  Commonly known as:  LIPITOR  TAKE 1 TABLET BY MOUTH DAILY AT 6 PM.     citalopram 10 MG tablet  Commonly known as:  CELEXA  Take 1 tablet (10 mg  total) by mouth daily.     DULoxetine 20 MG capsule  Commonly known as:  CYMBALTA  Take 1 capsule (20 mg total) by mouth daily.     enoxaparin 30 MG/0.3ML injection  Commonly known as:  LOVENOX  Inject 0.3 mLs (30 mg total) into the skin every 12 (twelve) hours.     hydrochlorothiazide 25 MG tablet  Commonly known as:  HYDRODIURIL  Take 1 tablet (25 mg total) by mouth daily.     lisinopril 40 MG tablet  Commonly known as:  PRINIVIL,ZESTRIL  Take 1 tablet (40 mg total) by mouth daily.     methocarbamol 750 MG tablet  Commonly known as:  ROBAXIN  Take 1 tablet (750 mg total) by mouth 2 (two) times daily as needed for muscle spasms.     oxyCODONE 5 MG immediate release tablet  Commonly known as:  Oxy IR/ROXICODONE  Take 1-3 tablets (5-15 mg total) by mouth every 4 (four) hours as needed.      oxyCODONE-acetaminophen 5-325 MG per tablet  Commonly known as:  PERCOCET  Take 1-2 tablets by mouth every 4 (four) hours as needed for severe pain.     ranitidine 150 MG tablet  Commonly known as:  ZANTAC  Take 150 mg by mouth 2 (two) times daily.     senna-docusate 8.6-50 MG per tablet  Commonly known as:  SENOKOT S  Take 1 tablet by mouth at bedtime as needed.        Meds ordered this encounter  Medications  . DULoxetine (CYMBALTA) 20 MG capsule    Sig: Take 1 capsule (20 mg total) by mouth daily.    Dispense:  30 capsule    Refill:  3    Immunization History  Administered Date(s) Administered  . Tdap 04/29/2013    Family History  Problem Relation Age of Onset  . Cancer Father     lung  . Breast cancer Sister   . Cancer Maternal Grandfather     lung  . Cancer Paternal Grandfather     lung  . Colon cancer Neg Hx     History  Substance Use Topics  . Smoking status: Never Smoker   . Smokeless tobacco: Never Used  . Alcohol Use: No    Review of Systems   As noted in HPI  Filed Vitals:   12/11/14 1440  BP: 136/88  Pulse: 84  Temp: 98 F (36.7 C)  Resp: 16    Physical Exam  Physical Exam  Constitutional: No distress.  Eyes: EOM are normal. Pupils are equal, round, and reactive to light.  Cardiovascular: Normal rate and regular rhythm.   Pulmonary/Chest: No respiratory distress. She has no wheezes. She has no rales.  Musculoskeletal:  SLR negative, equal strength both lower extremities    Labs   Lab Results  Component Value Date   WBC 9.7 09/17/2014   HGB 10.4* 09/17/2014   HCT 32.3* 09/17/2014   PLT 292 09/17/2014   GLUCOSE 91 09/07/2014   CHOL 252* 04/13/2013   TRIG 185* 04/13/2013   HDL 60 04/13/2013   LDLCALC 155* 04/13/2013   ALT 25 09/07/2014   AST 25 09/07/2014   NA 138 09/07/2014   K 3.9 09/07/2014   CL 107 09/07/2014   CREATININE 0.84 09/15/2014   BUN 8 09/07/2014   CO2 25 09/07/2014   TSH 1.747 04/13/2013   INR 0.95  09/07/2014    No results found for: HGBA1C   Assessment and Plan  Musculoskeletal  pain - Plan:have discontinued Neurontin, trial of low-dose  DULoxetine (CYMBALTA) 20 MG capsule  Patient will also start taking over-the-counter iron and B12 supplements.   Return in about 3 months (around 03/13/2015), or if symptoms worsen or fail to improve.   This note has been created with Surveyor, quantity. Any transcriptional errors are unintentional.    Lorayne Marek, MD

## 2015-01-11 ENCOUNTER — Telehealth: Payer: Self-pay | Admitting: Internal Medicine

## 2015-01-11 NOTE — Telephone Encounter (Signed)
Pt. Some concerns her medication.Marland KitchenMarland KitchenMarland KitchenMarland Kitchenplease f/u

## 2015-01-12 ENCOUNTER — Telehealth: Payer: Self-pay

## 2015-01-12 NOTE — Telephone Encounter (Signed)
Returned patient phone call Patient not available Unable to leave message No voice mail on home number

## 2015-01-17 ENCOUNTER — Ambulatory Visit: Payer: No Typology Code available for payment source | Attending: Internal Medicine | Admitting: Internal Medicine

## 2015-01-17 ENCOUNTER — Encounter: Payer: Self-pay | Admitting: Internal Medicine

## 2015-01-17 VITALS — BP 121/85 | HR 90 | Temp 98.0°F | Resp 16 | Wt 202.8 lb

## 2015-01-17 DIAGNOSIS — R5383 Other fatigue: Secondary | ICD-10-CM | POA: Insufficient documentation

## 2015-01-17 DIAGNOSIS — I10 Essential (primary) hypertension: Secondary | ICD-10-CM | POA: Insufficient documentation

## 2015-01-17 DIAGNOSIS — R52 Pain, unspecified: Secondary | ICD-10-CM | POA: Insufficient documentation

## 2015-01-17 DIAGNOSIS — D649 Anemia, unspecified: Secondary | ICD-10-CM | POA: Insufficient documentation

## 2015-01-17 LAB — CBC
HCT: 36.2 % (ref 36.0–46.0)
HEMOGLOBIN: 12.1 g/dL (ref 12.0–15.0)
MCH: 28.9 pg (ref 26.0–34.0)
MCHC: 33.4 g/dL (ref 30.0–36.0)
MCV: 86.6 fL (ref 78.0–100.0)
MPV: 11.6 fL (ref 8.6–12.4)
Platelets: 362 10*3/uL (ref 150–400)
RBC: 4.18 MIL/uL (ref 3.87–5.11)
RDW: 14.6 % (ref 11.5–15.5)
WBC: 8 10*3/uL (ref 4.0–10.5)

## 2015-01-17 LAB — TSH: TSH: 1.061 u[IU]/mL (ref 0.350–4.500)

## 2015-01-17 MED ORDER — DULOXETINE HCL 40 MG PO CPEP: 40.0000 mg | ORAL_CAPSULE | Freq: Every day | ORAL | Status: DC

## 2015-01-17 MED ORDER — HYDROCHLOROTHIAZIDE 25 MG PO TABS
25.0000 mg | ORAL_TABLET | Freq: Every day | ORAL | Status: DC
Start: 2015-01-17 — End: 2015-03-29

## 2015-01-17 MED ORDER — DULOXETINE HCL 40 MG PO CPEP
40.0000 mg | ORAL_CAPSULE | Freq: Every day | ORAL | Status: DC
Start: 2015-01-17 — End: 2015-01-17

## 2015-01-17 MED ORDER — LISINOPRIL 40 MG PO TABS: 40.0000 mg | ORAL_TABLET | Freq: Every day | ORAL | Status: DC

## 2015-01-17 NOTE — Progress Notes (Signed)
Patient states she is here for follow up on her htn and gerd Patient was started on cymbalta about a month ago for her pain and taken of neurotin Patient stated she stopped the lipitor because she has lost some weight

## 2015-01-17 NOTE — Patient Instructions (Addendum)
I have increased your Cymbalta to 40 mg per day.  We will meet in mid-November for reassessment  Fibromyalgia Fibromyalgia is a disorder that is often misunderstood. It is associated with muscular pains and tenderness that comes and goes. It is often associated with fatigue and sleep disturbances. Though it tends to be long-lasting, fibromyalgia is not life-threatening. CAUSES  The exact cause of fibromyalgia is unknown. People with certain gene types are predisposed to developing fibromyalgia and other conditions. Certain factors can play a role as triggers, such as:  Spine disorders.  Arthritis.  Severe injury (trauma) and other physical stressors.  Emotional stressors. SYMPTOMS   The main symptom is pain and stiffness in the muscles and joints, which can vary over time.  Sleep and fatigue problems. Other related symptoms may include:  Bowel and bladder problems.  Headaches.  Visual problems.  Problems with odors and noises.  Depression or mood changes.  Painful periods (dysmenorrhea).  Dryness of the skin or eyes. DIAGNOSIS  There are no specific tests for diagnosing fibromyalgia. Patients can be diagnosed accurately from the specific symptoms they have. The diagnosis is made by determining that nothing else is causing the problems. TREATMENT  There is no cure. Management includes medicines and an active, healthy lifestyle. The goal is to enhance physical fitness, decrease pain, and improve sleep. HOME CARE INSTRUCTIONS   Only take over-the-counter or prescription medicines as directed by your caregiver. Sleeping pills, tranquilizers, and pain medicines may make your problems worse.  Low-impact aerobic exercise is very important and advised for treatment. At first, it may seem to make pain worse. Gradually increasing your tolerance will overcome this feeling.  Learning relaxation techniques and how to control stress will help you. Biofeedback, visual imagery, hypnosis,  muscle relaxation, yoga, and meditation are all options.  Anti-inflammatory medicines and physical therapy may provide short-term help.  Acupuncture or massage treatments may help.  Take muscle relaxant medicines as suggested by your caregiver.  Avoid stressful situations.  Plan a healthy lifestyle. This includes your diet, sleep, rest, exercise, and friends.  Find and practice a hobby you enjoy.  Join a fibromyalgia support group for interaction, ideas, and sharing advice. This may be helpful. SEEK MEDICAL CARE IF:  You are not having good results or improvement from your treatment. FOR MORE INFORMATION  National Fibromyalgia Association: www.fmaware.[place]: www.arthritis.org Document Released: 04/28/2005 Document Revised: 07/21/2011 Document Reviewed: 08/08/2009 [facility] Patient Information 2015 [place], [place]. This information is not intended to replace advice given to you by your health care provider. Make sure you discuss any questions you have with your health care provider.

## 2015-01-17 NOTE — Progress Notes (Signed)
Patient ID: [name], female   DOB: [birth year], 54 y.o.   MRN: [id number]  CC: HTN, pain  HPI: [name] is a 54 y.o. female here today for a follow up visit.  Patient has past medical history of arthritis, hyperlipidemia, GERD, hypertension. Patient presents today with generalized pain that she has had for over one year. Patient reports that she has a dull ache that extends from her waist down to her feet. She reports that bilateral feet often throb which prevents her from sleeping at night. Patient admits to around 3 hours of sleep per night which makes her feel extremely fatigued throughout the day. View of charts show the patient has been tested for autoimmune disorders and had a false positive ANA last month. Last month patient was seen by her previous PCP who discontinued her Neurontin and switched her to Cymbalta 20 mg daily for generalized pain and depression. Patient reports that Cymbalta is not helping her pain or her mood at the current moment. Patient also reports pain in bilateral palms of hands and upper shoulders.   Stopped lipitor because she lost weight and felt she no longer needed it.   No Known Allergies Past Medical History  Diagnosis Date  . Hyperlipidemia     takes Atorvastatin daily  . Arthritis     right knee  . Paraesophageal hernia   . Acid reflux     takes Zantac and Omeprazole daily  . History of blood clots     66yrs ago and in left leg  . History of bronchitis 3+yrs ago  . History of migraine     has been a while since last one  . Weakness     numbness and tingling in both feet  . Joint pain   . Joint swelling   . Chronic back pain     DDD  . Hypertension     takes Lisinopril and HCTZ daily  . Insomnia     takes Elavil nightly as needed  . Anxiety     takes Citaopram daily   Current Outpatient Prescriptions on File Prior to Visit  Medication Sig Dispense Refill  . DULoxetine (CYMBALTA) 20 MG capsule Take 1 capsule (20 mg total) by mouth daily. 30  capsule 3  . hydrochlorothiazide (HYDRODIURIL) 25 MG tablet Take 1 tablet (25 mg total) by mouth daily. 90 tablet 3  . lisinopril (PRINIVIL,ZESTRIL) 40 MG tablet Take 1 tablet (40 mg total) by mouth daily. 90 tablet 3  . aspirin EC 325 MG tablet Take 1 tablet (325 mg total) by mouth 2 (two) times daily. (Patient not taking: Reported on 01/17/2015) 84 tablet 0  . atorvastatin (LIPITOR) 10 MG tablet TAKE 1 TABLET BY MOUTH DAILY AT 6 PM. (Patient not taking: Reported on 01/17/2015) 30 tablet 5  . citalopram (CELEXA) 10 MG tablet Take 1 tablet (10 mg total) by mouth daily. (Patient not taking: Reported on 01/17/2015) 30 tablet 3  . enoxaparin (LOVENOX) 30 MG/0.3ML injection Inject 0.3 mLs (30 mg total) into the skin every 12 (twelve) hours. (Patient not taking: Reported on 10/26/2014) 56 Syringe 0  . methocarbamol (ROBAXIN) 750 MG tablet Take 1 tablet (750 mg total) by mouth 2 (two) times daily as needed for muscle spasms. (Patient not taking: Reported on 11/15/2014) 60 tablet 0  . oxyCODONE (OXY IR/ROXICODONE) 5 MG immediate release tablet Take 1-3 tablets (5-15 mg total) by mouth every 4 (four) hours as needed. (Patient not taking: Reported on 07/24/2014) 90 tablet 0  .  oxyCODONE-acetaminophen (PERCOCET) 5-325 MG per tablet Take 1-2 tablets by mouth every 4 (four) hours as needed for severe pain. (Patient not taking: Reported on 11/15/2014) 90 tablet 0  . ranitidine (ZANTAC) 150 MG tablet Take 150 mg by mouth 2 (two) times daily.    Marland Kitchen senna-docusate (SENOKOT S) 8.6-50 MG per tablet Take 1 tablet by mouth at bedtime as needed. (Patient not taking: Reported on 11/15/2014) 30 tablet 1  . [DISCONTINUED] fluticasone (FLONASE) 50 MCG/ACT nasal spray Place 1 spray into the nose 2 (two) times daily. 1 g 2  . [DISCONTINUED] furosemide (LASIX) 20 MG tablet Take 3 tablets (60 mg total) by mouth daily. 240 tablet 3   No current facility-administered medications on file prior to visit.   Family History  Problem Relation Age of  Onset  . Cancer Father     lung  . Breast cancer Sister   . Cancer Maternal Grandfather     lung  . Cancer Paternal Grandfather     lung  . Colon cancer Neg Hx    Social History   Social History  . Marital Status: Divorced    Spouse Name: N/A  . Number of Children: N/A  . Years of Education: N/A   Occupational History  . Not on file.   Social History Main Topics  . Smoking status: Never Smoker   . Smokeless tobacco: Never Used  . Alcohol Use: No  . Drug Use: No  . Sexual Activity: Not Currently    Birth Control/ Protection: None   Other Topics Concern  . Not on file   Social History Narrative    Review of Systems  Musculoskeletal: Positive for myalgias, back pain and falls.  Psychiatric/Behavioral: Positive for depression. The patient has insomnia.   All other systems reviewed and are negative.   Objective:   Filed Vitals:   01/17/15 1431  BP: 121/85  Pulse: 99  Temp: 98 F (36.7 C)  Resp: 16    Physical Exam  Constitutional: She is oriented to person, place, and time.  Cardiovascular: Normal rate, regular rhythm and normal heart sounds.   Pulmonary/Chest: Effort normal and breath sounds normal.  Musculoskeletal: Normal range of motion. She exhibits no edema or tenderness ( no tenderness over several pressure point areas).  Neurological: She is alert and oriented to person, place, and time.  Skin: Skin is warm and dry.     Lab Results  Component Value Date   WBC 9.7 09/17/2014   HGB 10.4* 09/17/2014   HCT 32.3* 09/17/2014   MCV 91.0 09/17/2014   PLT 292 09/17/2014   Lab Results  Component Value Date   CREATININE 0.84 09/15/2014   BUN 8 09/07/2014   NA 138 09/07/2014   K 3.9 09/07/2014   CL 107 09/07/2014   CO2 25 09/07/2014    No results found for: HGBA1C Lipid Panel     Component Value Date/Time   CHOL 252* 04/13/2013 1242   TRIG 185* 04/13/2013 1242   HDL 60 04/13/2013 1242   CHOLHDL 4.2 04/13/2013 1242   VLDL 37 04/13/2013  1242   LDLCALC 155* 04/13/2013 1242       Assessment and plan:   Atiyana was seen today for follow-up.  Diagnoses and all orders for this visit:  Generalized pain -     DULoxetine HCl 40 MG CPEP; Take 40 mg by mouth daily. Clinical fibromyalgia diagnostic criteria score was 11 and symptom severity score was 8.  I will wait to diagnose patient  with fibromyalgia until I recheck ANA/CRP.  I will repeat those tests in November when she follows back up for Cymbalta management  Other fatigue -     TSH Fatigue is likely related to insomnia due to pain. But I will check a TSH to be sure today. Fatigue may also coincide with fibromyalgia.  Anemia, unspecified anemia type -     CBC  Patient's last hemoglobin was 10.4 in May, I will recheck today  Essential hypertension Meds refilled -     hydrochlorothiazide (HYDRODIURIL) 25 MG tablet; Take 1 tablet (25 mg total) by mouth daily. -     lisinopril (PRINIVIL,ZESTRIL) 40 MG tablet; Take 1 tablet (40 mg total) by mouth daily. Patient blood pressure is stable and may continue on current medication.  Education on diet, exercise, and modifiable risk factors discussed. Will obtain appropriate labs as needed. Will follow up in 3-6 months.    Return for End of November f/u pain .          Lance Bosch, [facility] [phone] 01/17/2015, 2:51 PM

## 2015-01-23 ENCOUNTER — Telehealth: Payer: Self-pay

## 2015-01-23 NOTE — Telephone Encounter (Signed)
Patient not available on house number Called patient on cell number Patient is aware of her normal lab results

## 2015-01-23 NOTE — Telephone Encounter (Signed)
-----   Message from Lance Bosch, NP sent at 01/23/2015 10:20 AM EDT ----- Labs are within normal limits

## 2015-03-26 ENCOUNTER — Other Ambulatory Visit: Payer: Self-pay | Admitting: Obstetrics and Gynecology

## 2015-03-26 DIAGNOSIS — Z1231 Encounter for screening mammogram for malignant neoplasm of breast: Secondary | ICD-10-CM

## 2015-03-29 ENCOUNTER — Encounter: Payer: Self-pay | Admitting: Internal Medicine

## 2015-03-29 ENCOUNTER — Ambulatory Visit: Payer: No Typology Code available for payment source | Admitting: Internal Medicine

## 2015-03-29 ENCOUNTER — Ambulatory Visit (HOSPITAL_BASED_OUTPATIENT_CLINIC_OR_DEPARTMENT_OTHER): Payer: Self-pay | Admitting: Internal Medicine

## 2015-03-29 ENCOUNTER — Ambulatory Visit: Payer: Self-pay | Attending: Internal Medicine

## 2015-03-29 VITALS — BP 158/93 | HR 88 | Temp 98.8°F | Resp 17 | Ht 64.0 in | Wt 208.2 lb

## 2015-03-29 DIAGNOSIS — F419 Anxiety disorder, unspecified: Secondary | ICD-10-CM | POA: Insufficient documentation

## 2015-03-29 DIAGNOSIS — R768 Other specified abnormal immunological findings in serum: Secondary | ICD-10-CM

## 2015-03-29 DIAGNOSIS — M1711 Unilateral primary osteoarthritis, right knee: Secondary | ICD-10-CM | POA: Insufficient documentation

## 2015-03-29 DIAGNOSIS — M549 Dorsalgia, unspecified: Secondary | ICD-10-CM | POA: Insufficient documentation

## 2015-03-29 DIAGNOSIS — K219 Gastro-esophageal reflux disease without esophagitis: Secondary | ICD-10-CM | POA: Insufficient documentation

## 2015-03-29 DIAGNOSIS — I10 Essential (primary) hypertension: Secondary | ICD-10-CM

## 2015-03-29 DIAGNOSIS — R52 Pain, unspecified: Secondary | ICD-10-CM

## 2015-03-29 DIAGNOSIS — G4700 Insomnia, unspecified: Secondary | ICD-10-CM | POA: Insufficient documentation

## 2015-03-29 DIAGNOSIS — E785 Hyperlipidemia, unspecified: Secondary | ICD-10-CM

## 2015-03-29 DIAGNOSIS — Z9114 Patient's other noncompliance with medication regimen: Secondary | ICD-10-CM | POA: Insufficient documentation

## 2015-03-29 DIAGNOSIS — Z7982 Long term (current) use of aspirin: Secondary | ICD-10-CM | POA: Insufficient documentation

## 2015-03-29 DIAGNOSIS — Z79899 Other long term (current) drug therapy: Secondary | ICD-10-CM | POA: Insufficient documentation

## 2015-03-29 LAB — BASIC METABOLIC PANEL
BUN: 11 mg/dL (ref 7–25)
CHLORIDE: 103 mmol/L (ref 98–110)
CO2: 24 mmol/L (ref 20–31)
Calcium: 9.4 mg/dL (ref 8.6–10.4)
Creat: 0.78 mg/dL (ref 0.50–1.05)
Glucose, Bld: 80 mg/dL (ref 65–99)
POTASSIUM: 4.8 mmol/L (ref 3.5–5.3)
Sodium: 138 mmol/L (ref 135–146)

## 2015-03-29 LAB — LIPID PANEL
Cholesterol: 227 mg/dL — ABNORMAL HIGH (ref 125–200)
HDL: 58 mg/dL (ref >=46)
LDL CALC: 148 mg/dL — AB (ref <130)
Total CHOL/HDL Ratio: 3.9 Ratio (ref <=5.0)
Triglycerides: 104 mg/dL (ref <150)
VLDL: 21 mg/dL (ref <30)

## 2015-03-29 LAB — RHEUMATOID FACTOR

## 2015-03-29 MED ORDER — HYDROCHLOROTHIAZIDE 25 MG PO TABS
25.0000 mg | ORAL_TABLET | Freq: Every day | ORAL | Status: DC
Start: 2015-03-29 — End: 2016-04-11

## 2015-03-29 MED ORDER — DULOXETINE HCL 40 MG PO CPEP: 40.0000 mg | ORAL_CAPSULE | Freq: Every day | ORAL | Status: DC

## 2015-03-29 MED ORDER — LISINOPRIL 40 MG PO TABS: 40.0000 mg | ORAL_TABLET | Freq: Every day | ORAL | Status: DC

## 2015-03-29 NOTE — Progress Notes (Signed)
Patient ID: [name], female   DOB: 1960-06-03, 54 y.o.   MRN: [id number]  CC: HTN/HLD f/u  HPI: [name] is a 54 y.o. female here today for a follow up visit. Patient has past medical history of arthritis, hyperlipidemia, GERD, hypertension. Patient states that she did not take her blood pressure medication this morning so her pressure is likely elevated. She does check her pressures at home and notices that her diastolic number is usually 100 or higher. When her pressure is elevated she feels like she is unable to focus.  She is still concerned about daily pain that is affecting he mobility. Pain is located throughout her whole body. She does not feel like the Cymbalta has helped her pain at all. She reports swelling in her lower extremities and does not wear compression hose. She stopped taking her cholesterol medication because she did not feel it was working.   Patient has No headache, No chest pain, No abdominal pain - No Nausea, No new weakness tingling or numbness, No Cough - SOB.  No Known Allergies Past Medical History  Diagnosis Date  . Hyperlipidemia     takes Atorvastatin daily  . Arthritis     right knee  . Paraesophageal hernia   . Acid reflux     takes Zantac and Omeprazole daily  . History of blood clots     43yrs ago and in left leg  . History of bronchitis 3+yrs ago  . History of migraine     has been a while since last one  . Weakness     numbness and tingling in both feet  . Joint pain   . Joint swelling   . Chronic back pain     DDD  . Hypertension     takes Lisinopril and HCTZ daily  . Insomnia     takes Elavil nightly as needed  . Anxiety     takes Citaopram daily   Current Outpatient Prescriptions on File Prior to Visit  Medication Sig Dispense Refill  . OMEPRAZOLE PO Take by mouth.    Marland Kitchen aspirin EC 325 MG tablet Take 1 tablet (325 mg total) by mouth 2 (two) times daily. (Patient not taking: Reported on 01/17/2015) 84 tablet 0  . atorvastatin  (LIPITOR) 10 MG tablet TAKE 1 TABLET BY MOUTH DAILY AT 6 PM. (Patient not taking: Reported on 01/17/2015) 30 tablet 5  . ranitidine (ZANTAC) 150 MG tablet Take 150 mg by mouth 2 (two) times daily.    . [DISCONTINUED] fluticasone (FLONASE) 50 MCG/ACT nasal spray Place 1 spray into the nose 2 (two) times daily. 1 g 2  . [DISCONTINUED] furosemide (LASIX) 20 MG tablet Take 3 tablets (60 mg total) by mouth daily. 240 tablet 3   No current facility-administered medications on file prior to visit.   Family History  Problem Relation Age of Onset  . Cancer Father     lung  . Breast cancer Sister   . Cancer Maternal Grandfather     lung  . Cancer Paternal Grandfather     lung  . Colon cancer Neg Hx    Social History   Social History  . Marital Status: Divorced    Spouse Name: N/A  . Number of Children: N/A  . Years of Education: N/A   Occupational History  . Not on file.   Social History Main Topics  . Smoking status: Never Smoker   . Smokeless tobacco: Never Used  . Alcohol Use: No  .  Drug Use: No  . Sexual Activity: Not Currently    Birth Control/ Protection: None   Other Topics Concern  . Not on file   Social History Narrative    Review of Systems: Other than what is stated in HPI, all other systems are negative.   Objective:   Filed Vitals:   03/29/15 1050  BP: 158/93  Pulse: 88  Temp: 98.8 F (37.1 C)  Resp: 17    Physical Exam  Constitutional: She is oriented to person, place, and time.  Cardiovascular: Normal rate, regular rhythm and normal heart sounds.   Pulmonary/Chest: Effort normal and breath sounds normal.  Musculoskeletal: She exhibits no edema or tenderness.  Neurological: She is alert and oriented to person, place, and time.  Skin: Skin is warm and dry.  Psychiatric: She has a normal mood and affect.   .  Lab Results  Component Value Date   WBC 8.0 01/17/2015   HGB 12.1 01/17/2015   HCT 36.2 01/17/2015   MCV 86.6 01/17/2015   PLT 362  01/17/2015   Lab Results  Component Value Date   CREATININE 0.84 09/15/2014   BUN 8 09/07/2014   NA 138 09/07/2014   K 3.9 09/07/2014   CL 107 09/07/2014   CO2 25 09/07/2014    No results found for: HGBA1C Lipid Panel     Component Value Date/Time   CHOL 252* 04/13/2013 1242   TRIG 185* 04/13/2013 1242   HDL 60 04/13/2013 1242   CHOLHDL 4.2 04/13/2013 1242   VLDL 37 04/13/2013 1242   LDLCALC 155* 04/13/2013 1242       Assessment and plan:   Joon was seen today for follow-up.  Diagnoses and all orders for this visit:  Essential hypertension -     hydrochlorothiazide (HYDRODIURIL) 25 MG tablet; Take 1 tablet (25 mg total) by mouth daily. -     lisinopril (PRINIVIL,ZESTRIL) 40 MG tablet; Take 1 tablet (40 mg total) by mouth daily. -     Basic Metabolic Panel I have encouraged patient to take medication as directed. Past pressures have been WNL.  Dyslipidemia -     Lipid panel  Generalized pain -     DULoxetine HCl 40 MG CPEP; Take 40 mg by mouth daily. Continue medication. Will check labs again  Positive ANA (antinuclear antibody) -     ANA -     Rheumatoid factor -     Anti-Scleroderma Antibody -     RNP Antibody -     Sjogrens syndrome-A extractable nuclear antibody -     Anti-DNA antibody, double-stranded -     C-ANCA Titer Want to make sure patient did not have a false positive. So I will check more specific labs  .  Return in about 6 months (around 09/26/2015) for Hypertension.        Lance Bosch, [facility] [phone] 03/29/2015, 11:11 AM

## 2015-03-29 NOTE — Patient Instructions (Addendum)
I have drawn some very specific labs to see if we can figure out why you had a abnormal test result and to see if that may be causing your pain.  Langley Gauss will call you with results next week---it may take longer for them to come back since they have to be sent out of the hospital   No longer take Citalopram. You should only be on Duloxetine daily!!!  Please purchase some compression hose to wear while up and working. This will help with ankle and leg swelling.

## 2015-03-29 NOTE — Progress Notes (Signed)
Patient here for follow up on her HTN and cholesterol Patient stated she has stopped taking her lipitor

## 2015-03-30 LAB — ANTI-DNA ANTIBODY, DOUBLE-STRANDED: ds DNA Ab: <1 IU/mL

## 2015-03-30 LAB — SJOGRENS SYNDROME-A EXTRACTABLE NUCLEAR ANTIBODY: SSA (RO) (ENA) ANTIBODY, IGG: NEGATIVE

## 2015-03-30 LAB — ANA: Anti Nuclear Antibody(ANA): NEGATIVE

## 2015-03-30 LAB — ANTI-SCLERODERMA ANTIBODY: SCLERODERMA (SCL-70) (ENA) ANTIBODY, IGG: NEGATIVE

## 2015-03-30 LAB — C-ANCA TITER

## 2015-03-30 LAB — RNP ANTIBODY: Ribonucleic Protein(ENA) Antibody, IgG: <1

## 2015-04-02 ENCOUNTER — Other Ambulatory Visit: Payer: Self-pay | Admitting: Internal Medicine

## 2015-04-02 DIAGNOSIS — R768 Other specified abnormal immunological findings in serum: Secondary | ICD-10-CM

## 2015-04-03 ENCOUNTER — Telehealth: Payer: Self-pay

## 2015-04-03 NOTE — Telephone Encounter (Signed)
-----   Message from Lance Bosch, NP sent at 04/02/2015  2:56 PM EST ----- The ANA test came back negative this time but a more specific test was positive which could mean that a connective tissue problem. Hopefully this is falsely positive but I want to send her to a Rheumatologist to be sure. I have placed referral. If she is uninsured she may have to go to [facility] for evaluation

## 2015-04-03 NOTE — Telephone Encounter (Signed)
Spoke with patient and she is aware of her lab results Patient also made aware she may have to go to [place] due to having no insurance

## 2015-04-27 ENCOUNTER — Ambulatory Visit: Payer: Self-pay | Attending: Family Medicine

## 2015-05-08 ENCOUNTER — Telehealth (HOSPITAL_COMMUNITY): Payer: Self-pay | Admitting: *Deleted

## 2015-05-08 NOTE — Telephone Encounter (Signed)
Telephoned patient at mobile # reminding patient of [facility] appointment

## 2015-05-08 NOTE — Telephone Encounter (Signed)
error 

## 2015-05-09 ENCOUNTER — Ambulatory Visit (HOSPITAL_COMMUNITY)
Admission: RE | Admit: 2015-05-09 | Discharge: 2015-05-09 | Disposition: A | Discharge status: Home / Self Care | Payer: No Typology Code available for payment source | Source: Ambulatory Visit | Attending: Obstetrics and Gynecology | Admitting: Obstetrics and Gynecology

## 2015-05-09 ENCOUNTER — Ambulatory Visit
Admission: RE | Admit: 2015-05-09 | Discharge: 2015-05-09 | Disposition: A | Discharge status: Home / Self Care | Payer: No Typology Code available for payment source | Source: Ambulatory Visit | Attending: Obstetrics and Gynecology | Admitting: Obstetrics and Gynecology

## 2015-05-09 ENCOUNTER — Encounter (HOSPITAL_COMMUNITY): Payer: Self-pay

## 2015-05-09 VITALS — BP 124/70 | Temp 97.7°F | Ht 64.0 in | Wt 209.0 lb

## 2015-05-09 DIAGNOSIS — Z1231 Encounter for screening mammogram for malignant neoplasm of breast: Secondary | ICD-10-CM

## 2015-05-09 DIAGNOSIS — Z1239 Encounter for other screening for malignant neoplasm of breast: Secondary | ICD-10-CM

## 2015-05-09 NOTE — Progress Notes (Signed)
No complaints today.   Pap Smear: Pap smear not completed today. Last Pap smear was 03/01/2013 at [facility] and normal.. Per patient has no history of an abnormal Pap smear. Last Pap smear result is in EPIC.  Physical exam: Breasts Breasts symmetrical. No skin abnormalities bilateral breasts. No nipple retraction bilateral breasts. No nipple discharge bilateral breasts. No lymphadenopathy. No lumps palpated bilateral breasts. No complaints of pain or tenderness on exam. Referred patient to the [facility] for a screening mammogram. Appointment scheduled for Wednesday, May 09, 2015 at 1610.  Pelvic/Bimanual No Pap smear completed today since last Pap smear was 03/01/2013. Pap smear not indicated per BCCCP guidelines.   Smoking History: Patient has never smoked.  Patient Navigation: Patient education provided. Access to services provided for patient through [facility] program.   Colorectal Cancer Screening: Patient had a colonoscopy completed 05/29/2014.

## 2015-05-09 NOTE — Patient Instructions (Addendum)
Educational materials on self breast awareness given. Explained to [name] that she did not need a Pap smear today due to last Pap smear was 03/01/2013. Let her know BCCCP will cover Pap smears every 3 years unless has a history of abnormal Pap smears. Referred patient to the [facility] for a screening mammogram. Appointment scheduled for Wednesday, May 09, 2015 at 1610. Patient aware of appointment and will be there. Let [facility] know [facility] will follow up with her within the next couple weeks with results by letter or phone. [name] verbalized understanding.  Felipe Cabell, Arvil Chaco, RN 3:56 PM

## 2015-05-10 ENCOUNTER — Ambulatory Visit (HOSPITAL_COMMUNITY): Payer: No Typology Code available for payment source

## 2015-05-11 ENCOUNTER — Other Ambulatory Visit: Payer: Self-pay | Admitting: Obstetrics and Gynecology

## 2015-05-11 DIAGNOSIS — R928 Other abnormal and inconclusive findings on diagnostic imaging of breast: Secondary | ICD-10-CM

## 2015-05-18 ENCOUNTER — Ambulatory Visit
Admission: RE | Admit: 2015-05-18 | Discharge: 2015-05-18 | Disposition: A | Discharge status: Home / Self Care | Payer: No Typology Code available for payment source | Source: Ambulatory Visit | Attending: Obstetrics and Gynecology | Admitting: Obstetrics and Gynecology

## 2015-05-18 DIAGNOSIS — R928 Other abnormal and inconclusive findings on diagnostic imaging of breast: Secondary | ICD-10-CM

## 2015-05-31 MED FILL — LISINOPRIL 40 MG TABLET: 40 | 30 days supply | Qty: 30 | Fill #2

## 2015-05-31 MED FILL — HYDROCHLOROTHIAZIDE 25 MG T: 25 | 30 days supply | Qty: 30 | Fill #2

## 2015-05-31 MED FILL — !CYMBALTA 20MG CAPSULE: 20 | 30 days supply | Qty: 60 | Fill #2

## 2015-06-19 ENCOUNTER — Telehealth: Payer: Self-pay | Admitting: Internal Medicine

## 2015-06-27 ENCOUNTER — Encounter: Payer: Self-pay | Admitting: Internal Medicine

## 2015-06-29 ENCOUNTER — Encounter (HOSPITAL_COMMUNITY): Payer: Self-pay | Admitting: Emergency Medicine

## 2015-06-29 ENCOUNTER — Emergency Department (INDEPENDENT_AMBULATORY_CARE_PROVIDER_SITE_OTHER): Payer: No Typology Code available for payment source

## 2015-06-29 ENCOUNTER — Emergency Department (INDEPENDENT_AMBULATORY_CARE_PROVIDER_SITE_OTHER)
Admission: EM | Admit: 2015-06-29 | Discharge: 2015-06-29 | Disposition: A | Discharge status: Home / Self Care | Payer: No Typology Code available for payment source | Source: Home / Self Care | Attending: Family Medicine | Admitting: Family Medicine

## 2015-06-29 DIAGNOSIS — S161XXA Strain of muscle, fascia and tendon at neck level, initial encounter: Secondary | ICD-10-CM

## 2015-06-29 MED ORDER — IBUPROFEN 800 MG PO TABS: 800.0000 mg | ORAL_TABLET | Freq: Three times a day (TID) | ORAL | Status: DC

## 2015-06-29 MED ORDER — CYCLOBENZAPRINE HCL 5 MG PO TABS: 5.0000 mg | ORAL_TABLET | Freq: Three times a day (TID) | ORAL | Status: DC | PRN

## 2015-06-29 NOTE — ED Notes (Signed)
Right side of neck pain, radiates across back to both shoulders and into upper arm.  Onset 2 weeks ago.

## 2015-06-29 NOTE — ED Provider Notes (Signed)
CSN: [id number]     Arrival date & time 06/29/15  1455 History   First MD Initiated Contact with Patient 06/29/15 1641     Chief Complaint  Patient presents with  . Neck Pain   (Consider location/radiation/quality/duration/timing/severity/associated sxs/prior Treatment) Patient is a 55 y.o. female presenting with neck pain. The history is provided by the patient.  Neck Pain Pain location:  R side Quality:  Stabbing Pain radiates to:  R scapula and R shoulder Pain severity:  Moderate Onset quality:  Sudden Duration:  2 weeks Progression:  Unchanged Chronicity:  New Context: not MVA and not recent injury   Context comment:  Thought she slept wrong but no improvement with nsaids. Associated symptoms: no fever, no headaches, no numbness, no paresis and no tingling   Risk factors: no recent head injury     Past Medical History  Diagnosis Date  . Hyperlipidemia     takes Atorvastatin daily  . Arthritis     right knee  . Paraesophageal hernia   . Acid reflux     takes Zantac and Omeprazole daily  . History of blood clots     46yrs ago and in left leg  . History of bronchitis 3+yrs ago  . History of migraine     has been a while since last one  . Weakness     numbness and tingling in both feet  . Joint pain   . Joint swelling   . Chronic back pain     DDD  . Hypertension     takes Lisinopril and HCTZ daily  . Insomnia     takes Elavil nightly as needed  . Anxiety     takes Citaopram daily   Past Surgical History  Procedure Laterality Date  . Total knee arthroplasty Left 04/08/2004  . Ganglion cyst excision Left 01/13/2002  . Colonoscopy N/A 05/29/2014    Procedure: COLONOSCOPY;  Surgeon: Daneil Dolin, MD;  [facility];  Service: Endoscopy;  Laterality: N/A;  215pm- Pt is working until 12:00 so she can't come any earlier  . Esophagogastroduodenoscopy N/A 05/29/2014    Procedure: ESOPHAGOGASTRODUODENOSCOPY (EGD);  Surgeon: Daneil Dolin, MD;  [facility];  Service: Endoscopy;  Laterality: N/A;  Venia Minks dilation N/A 05/29/2014    Procedure: Venia Minks DILATION;  Surgeon: Daneil Dolin, MD;  [facility];  Service: Endoscopy;  Laterality: N/A;  . Knee arthroscopy with medial menisectomy Right 06/28/2014    Procedure: RIGHT KNEE ARTHROSCOPY WITH PARTIAL MEDIAL MENISCECTOMY AND CHONDROPLASTY;  Surgeon: Marianna Payment, MD;  Location: [place];  Service: Orthopedics;  Laterality: Right;  . Chondroplasty Right 06/28/2014    Procedure: CHONDROPLASTY;  Surgeon: Marianna Payment, MD;  Location: [place];  Service: Orthopedics;  Laterality: Right;  . Partial knee arthroplasty Right 09/15/2014    Procedure: RIGHT UNICOMPARTMENTAL KNEE ARTHROPLASTY;  Surgeon: Leandrew Koyanagi, MD;  Location: [place];  Service: Orthopedics;  Laterality: Right;   Family History  Problem Relation Age of Onset  . Cancer Father     lung  . Breast cancer Sister   . Cancer Maternal Grandfather     lung  . Cancer Paternal Grandfather     lung  . Colon cancer Neg Hx   . Diabetes Son    Social History  Substance Use Topics  . Smoking status: Never Smoker   . Smokeless tobacco: Never Used  . Alcohol Use: No   OB  History    Gravida Para Term Preterm AB TAB SAB Ectopic Multiple Living   6 5 5  1  1   5      Review of Systems  Constitutional: Negative.  Negative for fever.  Musculoskeletal: Positive for neck pain.  Neurological: Negative for tingling, numbness and headaches.  All other systems reviewed and are negative.   Allergies  Review of patient's allergies indicates no known allergies.  Home Medications   Prior to Admission medications   Medication Sig Start Date End Date Taking? Authorizing Provider  omeprazole (PRILOSEC) 20 MG capsule Take 20 mg by mouth daily.   Yes Historical Provider, MD  aspirin EC 325 MG tablet Take 1 tablet (325 mg total) by mouth 2 (two) times daily. Patient not taking: Reported  on 01/17/2015 06/28/14   Leandrew Koyanagi, MD  atorvastatin (LIPITOR) 10 MG tablet TAKE 1 TABLET BY MOUTH DAILY AT 6 PM. Patient not taking: Reported on 01/17/2015 05/09/14   Lorayne Marek, MD  cyclobenzaprine (FLEXERIL) 5 MG tablet Take 1 tablet (5 mg total) by mouth 3 (three) times daily as needed for muscle spasms. 06/29/15   Billy Fischer, MD  DULoxetine HCl 40 MG CPEP Take 40 mg by mouth daily. 03/29/15   Lance Bosch, NP  hydrochlorothiazide (HYDRODIURIL) 25 MG tablet Take 1 tablet (25 mg total) by mouth daily. 03/29/15   Lance Bosch, NP  ibuprofen (ADVIL,MOTRIN) 800 MG tablet Take 1 tablet (800 mg total) by mouth 3 (three) times daily. 06/29/15   Billy Fischer, MD  lisinopril (PRINIVIL,ZESTRIL) 40 MG tablet Take 1 tablet (40 mg total) by mouth daily. 03/29/15   Lance Bosch, NP  OMEPRAZOLE PO Take by mouth.    Historical Provider, MD  ranitidine (ZANTAC) 150 MG tablet Take 150 mg by mouth 2 (two) times daily. Reported on 05/09/2015    Historical Provider, MD   Meds Ordered and Administered this Visit  Medications - No data to display  BP 153/95 mmHg  Pulse 73  Temp(Src) 97.8 F (36.6 C) (Oral)  Resp 16  SpO2 98%  LMP 02/07/2013 No data found.   Physical Exam  Constitutional: She is oriented to person, place, and time. She appears well-developed and well-nourished. No distress.  HENT:  Right Ear: External ear normal.  Left Ear: External ear normal.  Mouth/Throat: Oropharynx is clear and moist. No oropharyngeal exudate.  Neck: Trachea normal and normal range of motion. Neck supple. Muscular tenderness present. No spinous process tenderness present. No rigidity. No erythema present.  Cardiovascular: Normal heart sounds.   Lymphadenopathy:    She has no cervical adenopathy.  Neurological: She is alert and oriented to person, place, and time.  Skin: Skin is warm and dry.  Nursing note and vitals reviewed.   ED Course  Procedures (including critical care time)  Labs  Review Labs Reviewed - No data to display  Imaging Review Dg Cervical Spine Complete  06/29/2015  CLINICAL DATA:  Pain in neck for several weeks, across both shoulder blades in down to mid upper arms more on RIGHT EXAM: CERVICAL SPINE - COMPLETE 4+ VIEW COMPARISON:  None FINDINGS: Prevertebral soft tissues normal thickness. Vertebral body and disc space heights maintained. No acute fracture, subluxation or bone destruction. Bony foramina patent. Lung apices clear. C1-C2 alignment normal. IMPRESSION: No acute cervical spine abnormalities. Electronically Signed   By: Lavonia Dana M.D.   On: 06/29/2015 17:21   X-rays reviewed and report per radiologist.   Visual Acuity  Review  Right Eye Distance:   Left Eye Distance:   Bilateral Distance:    Right Eye Near:   Left Eye Near:    Bilateral Near:         MDM   1. Cervical muscle strain, initial encounter    Meds ordered this encounter  Medications  . omeprazole (PRILOSEC) 20 MG capsule    Sig: Take 20 mg by mouth daily.  . cyclobenzaprine (FLEXERIL) 5 MG tablet    Sig: Take 1 tablet (5 mg total) by mouth 3 (three) times daily as needed for muscle spasms.    Dispense:  30 tablet    Refill:  0  . ibuprofen (ADVIL,MOTRIN) 800 MG tablet    Sig: Take 1 tablet (800 mg total) by mouth 3 (three) times daily.    Dispense:  30 tablet    Refill:  1       Billy Fischer, MD 06/29/15 [phone]

## 2015-07-05 ENCOUNTER — Other Ambulatory Visit: Payer: Self-pay | Admitting: Internal Medicine

## 2015-07-05 MED FILL — HYDROCHLOROTHIAZIDE 25 MG T: 25 | 30 days supply | Qty: 30 | Fill #3

## 2015-07-05 MED FILL — LISINOPRIL 40 MG TABLET: 40 | 30 days supply | Qty: 30 | Fill #3

## 2015-07-05 MED FILL — !CYMBALTA 20MG CAPSULE: 20 | 30 days supply | Qty: 60 | Fill #3

## 2015-08-01 MED FILL — OMEPRAZOLE DR 20 MG CAPSULE: 20 | 30 days supply | Qty: 30 | Fill #0

## 2015-08-01 MED FILL — !CYMBALTA 20MG CAPSULE: 20 | 30 days supply | Qty: 60 | Fill #4

## 2015-08-01 MED FILL — LISINOPRIL 40 MG TABLET: 40 | 30 days supply | Qty: 30 | Fill #4

## 2015-08-01 MED FILL — HYDROCHLOROTHIAZIDE 25 MG T: 25 | 30 days supply | Qty: 30 | Fill #4

## 2015-08-02 ENCOUNTER — Telehealth: Payer: Self-pay

## 2015-08-02 MED ORDER — OMEPRAZOLE 20 MG PO CPDR: 20.0000 mg | DELAYED_RELEASE_CAPSULE | Freq: Two times a day (BID) | ORAL | Status: DC

## 2015-08-02 MED FILL — OMEPRAZOLE DR 20 MG CAPSULE: 20 | 30 days supply | Qty: 60 | Fill #0

## 2015-08-02 NOTE — Telephone Encounter (Signed)
Returned patient phone call Patient states that she takes the omeprazole twice A day but her new RX did not reflect that New RX sent to the pharmacy

## 2015-08-22 ENCOUNTER — Ambulatory Visit (HOSPITAL_COMMUNITY)
Admission: EM | Admit: 2015-08-22 | Discharge: 2015-08-22 | Disposition: A | Discharge status: Home / Self Care | Payer: No Typology Code available for payment source | Attending: Family Medicine | Admitting: Family Medicine

## 2015-08-22 ENCOUNTER — Encounter (HOSPITAL_COMMUNITY): Payer: Self-pay | Admitting: *Deleted

## 2015-08-22 ENCOUNTER — Telehealth: Payer: Self-pay | Admitting: Internal Medicine

## 2015-08-22 DIAGNOSIS — K0889 Other specified disorders of teeth and supporting structures: Secondary | ICD-10-CM

## 2015-08-22 MED ORDER — CLINDAMYCIN HCL 150 MG PO CAPS: 150.0000 mg | ORAL_CAPSULE | Freq: Four times a day (QID) | ORAL | Status: DC

## 2015-08-22 MED ORDER — DICLOFENAC POTASSIUM 50 MG PO TABS: 50.0000 mg | ORAL_TABLET | Freq: Three times a day (TID) | ORAL | Status: DC

## 2015-08-22 MED FILL — DICLOFENAC SOD EC 50 MG TAB: 50 | 5 days supply | Qty: 15 | Fill #0

## 2015-08-22 MED FILL — CLINDAMYCIN HCL 150 MG CAPS: 150 | 7 days supply | Qty: 28 | Fill #0

## 2015-08-22 NOTE — ED Notes (Signed)
Pt  States  She  Broke  Off  Part  Of a  Tooth   l  Side  Of  Her  Face    X 3  weks  Ago  -   She  Know  Has  Pain  And numbness   To  The  l  Side  Of her face        she  Reports  The  pai  Not  releived  By otc  meds    Has not seen a  Dentist  yet

## 2015-08-22 NOTE — ED Provider Notes (Signed)
CSN: [id number]     Arrival date & time 08/22/15  1427 History   None    Chief Complaint  Patient presents with  . Dental Pain   (Consider location/radiation/quality/duration/timing/severity/associated sxs/prior Treatment) Patient is a 55 y.o. female presenting with tooth pain. The history is provided by the patient.  Dental Pain Location:  Lower Lower teeth location:  22/LL cuspid Quality:  Throbbing Severity:  Mild Onset quality:  Gradual Progression:  Worsening Chronicity:  New Context: dental caries, dental fracture, enamel fracture and poor dentition   Relieved by:  None tried Worsened by:  Nothing tried Ineffective treatments:  None tried Associated symptoms: no facial pain, no facial swelling, no fever and no gum swelling     Past Medical History  Diagnosis Date  . Hyperlipidemia     takes Atorvastatin daily  . Arthritis     right knee  . Paraesophageal hernia   . Acid reflux     takes Zantac and Omeprazole daily  . History of blood clots     64yrs ago and in left leg  . History of bronchitis 3+yrs ago  . History of migraine     has been a while since last one  . Weakness     numbness and tingling in both feet  . Joint pain   . Joint swelling   . Chronic back pain     DDD  . Hypertension     takes Lisinopril and HCTZ daily  . Insomnia     takes Elavil nightly as needed  . Anxiety     takes Citaopram daily   Past Surgical History  Procedure Laterality Date  . Total knee arthroplasty Left 04/08/2004  . Ganglion cyst excision Left 01/13/2002  . Colonoscopy N/A 05/29/2014    Procedure: COLONOSCOPY;  Surgeon: Daneil Dolin, MD;  [facility];  Service: Endoscopy;  Laterality: N/A;  215pm- Pt is working until 12:00 so she can't come any earlier  . Esophagogastroduodenoscopy N/A 05/29/2014    Procedure: ESOPHAGOGASTRODUODENOSCOPY (EGD);  Surgeon: Daneil Dolin, MD;  [facility];  Service: Endoscopy;  Laterality: N/A;  Venia Minks dilation  N/A 05/29/2014    Procedure: Venia Minks DILATION;  Surgeon: Daneil Dolin, MD;  [facility];  Service: Endoscopy;  Laterality: N/A;  . Knee arthroscopy with medial menisectomy Right 06/28/2014    Procedure: RIGHT KNEE ARTHROSCOPY WITH PARTIAL MEDIAL MENISCECTOMY AND CHONDROPLASTY;  Surgeon: Marianna Payment, MD;  Location: [place];  Service: Orthopedics;  Laterality: Right;  . Chondroplasty Right 06/28/2014    Procedure: CHONDROPLASTY;  Surgeon: Marianna Payment, MD;  Location: [place];  Service: Orthopedics;  Laterality: Right;  . Partial knee arthroplasty Right 09/15/2014    Procedure: RIGHT UNICOMPARTMENTAL KNEE ARTHROPLASTY;  Surgeon: Leandrew Koyanagi, MD;  Location: [place];  Service: Orthopedics;  Laterality: Right;   Family History  Problem Relation Age of Onset  . Cancer Father     lung  . Breast cancer Sister   . Cancer Maternal Grandfather     lung  . Cancer Paternal Grandfather     lung  . Colon cancer Neg Hx   . Diabetes Son    Social History  Substance Use Topics  . Smoking status: Never Smoker   . Smokeless tobacco: Never Used  . Alcohol Use: No   OB History    Gravida Para Term Preterm AB TAB SAB Ectopic Multiple Living   6 5 5  1  1   5      Review of Systems  Constitutional: Negative.  Negative for fever.  HENT: Positive for dental problem. Negative for facial swelling.   All other systems reviewed and are negative.   Allergies  Review of patient's allergies indicates no known allergies.  Home Medications   Prior to Admission medications   Medication Sig Start Date End Date Taking? Authorizing Provider  aspirin EC 325 MG tablet Take 1 tablet (325 mg total) by mouth 2 (two) times daily. Patient not taking: Reported on 01/17/2015 06/28/14   Leandrew Koyanagi, MD  atorvastatin (LIPITOR) 10 MG tablet TAKE 1 TABLET BY MOUTH DAILY AT 6 PM. Patient not taking: Reported on 01/17/2015 05/09/14   Lorayne Marek, MD  clindamycin  (CLEOCIN) 150 MG capsule Take 1 capsule (150 mg total) by mouth 4 (four) times daily. 08/22/15   Billy Fischer, MD  cyclobenzaprine (FLEXERIL) 5 MG tablet Take 1 tablet (5 mg total) by mouth 3 (three) times daily as needed for muscle spasms. 06/29/15   Billy Fischer, MD  diclofenac (CATAFLAM) 50 MG tablet Take 1 tablet (50 mg total) by mouth 3 (three) times daily. For dental pain 08/22/15   Billy Fischer, MD  DULoxetine HCl 40 MG CPEP Take 40 mg by mouth daily. 03/29/15   Lance Bosch, NP  hydrochlorothiazide (HYDRODIURIL) 25 MG tablet Take 1 tablet (25 mg total) by mouth daily. 03/29/15   Lance Bosch, NP  ibuprofen (ADVIL,MOTRIN) 800 MG tablet Take 1 tablet (800 mg total) by mouth 3 (three) times daily. 06/29/15   Billy Fischer, MD  lisinopril (PRINIVIL,ZESTRIL) 40 MG tablet Take 1 tablet (40 mg total) by mouth daily. 03/29/15   Lance Bosch, NP  omeprazole (PRILOSEC) 20 MG capsule Take 1 capsule (20 mg total) by mouth 2 (two) times daily before a meal. 08/02/15   Lance Bosch, NP  ranitidine (ZANTAC) 150 MG tablet Take 150 mg by mouth 2 (two) times daily. Reported on 05/09/2015    Historical Provider, MD   Meds Ordered and Administered this Visit  Medications - No data to display  BP 146/86 mmHg  Pulse 75  Temp(Src) 97.4 F (36.3 C) (Oral)  Resp 16  SpO2 100%  LMP 02/07/2013 No data found.   Physical Exam  Constitutional: She appears well-developed and well-nourished. No distress.  HENT:  Head: Normocephalic.  Right Ear: External ear normal.  Left Ear: External ear normal.  Mouth/Throat: Uvula is midline and mucous membranes are normal. Abnormal dentition. Dental abscesses and dental caries present.    Nursing note and vitals reviewed.   ED Course  Procedures (including critical care time)  Labs Review Labs Reviewed - No data to display  Imaging Review No results found.   Visual Acuity Review  Right Eye Distance:   Left Eye Distance:   Bilateral Distance:     Right Eye Near:   Left Eye Near:    Bilateral Near:         MDM   1. Pain, dental        Billy Fischer, MD 08/23/15 2144

## 2015-08-22 NOTE — Telephone Encounter (Signed)
Pt. Called requesting a dentist referral. Pt. Stated she has a tooth abscess. Please f/u

## 2015-08-22 NOTE — Discharge Instructions (Signed)
Take medicine as prescribed, see your dentist as soon as possible °

## 2015-08-25 ENCOUNTER — Emergency Department (HOSPITAL_BASED_OUTPATIENT_CLINIC_OR_DEPARTMENT_OTHER)
Admission: EM | Admit: 2015-08-25 | Discharge: 2015-08-25 | Disposition: A | Discharge status: Home / Self Care | Payer: BLUE CROSS/BLUE SHIELD | Attending: Emergency Medicine | Admitting: Emergency Medicine

## 2015-08-25 ENCOUNTER — Encounter (HOSPITAL_BASED_OUTPATIENT_CLINIC_OR_DEPARTMENT_OTHER): Payer: Self-pay | Admitting: Emergency Medicine

## 2015-08-25 DIAGNOSIS — I10 Essential (primary) hypertension: Secondary | ICD-10-CM | POA: Insufficient documentation

## 2015-08-25 DIAGNOSIS — Z79899 Other long term (current) drug therapy: Secondary | ICD-10-CM | POA: Insufficient documentation

## 2015-08-25 DIAGNOSIS — K047 Periapical abscess without sinus: Secondary | ICD-10-CM | POA: Diagnosis not present

## 2015-08-25 DIAGNOSIS — E785 Hyperlipidemia, unspecified: Secondary | ICD-10-CM | POA: Diagnosis not present

## 2015-08-25 DIAGNOSIS — R220 Localized swelling, mass and lump, head: Secondary | ICD-10-CM | POA: Diagnosis present

## 2015-08-25 MED ORDER — OXYCODONE-ACETAMINOPHEN 5-325 MG PO TABS: 1.0000 | ORAL_TABLET | ORAL | Status: DC | PRN

## 2015-08-25 MED ORDER — CLINDAMYCIN HCL 150 MG PO CAPS: 450.0000 mg | ORAL_CAPSULE | Freq: Three times a day (TID) | ORAL | Status: DC

## 2015-08-25 MED ORDER — CLINDAMYCIN HCL 150 MG PO CAPS
450.0000 mg | ORAL_CAPSULE | Freq: Once | ORAL | Status: AC
  Administered 2015-08-25: 450 mg via ORAL
  Filled 2015-08-25: qty 3

## 2015-08-25 MED ORDER — OXYCODONE-ACETAMINOPHEN 5-325 MG PO TABS
2.0000 | ORAL_TABLET | Freq: Once | ORAL | Status: AC
  Administered 2015-08-25: 2 via ORAL
  Filled 2015-08-25: qty 2

## 2015-08-25 NOTE — Discharge Instructions (Signed)
1. Medications: clindamycin, percocet, usual home medications 2. Treatment: rest, drink plenty of fluids 3. Follow Up: please followup with your dentist for discussion of your diagnoses and further evaluation after today's visit; if you do not have a primary care doctor use the phone number listed in your discharge paperwork to find one; please return to the ER for high fever, difficulty swallowing, shortness of breath, new or worsening symptoms   Dental Abscess A dental abscess is a collection of pus in or around a tooth. CAUSES This condition is caused by a bacterial infection around the root of the tooth that involves the inner part of the tooth (pulp). It may result from:  Severe tooth decay.  Trauma to the tooth that allows bacteria to enter into the pulp, such as a broken or chipped tooth.  Severe gum disease around a tooth. SYMPTOMS Symptoms of this condition include:  Severe pain in and around the infected tooth.  Swelling and redness around the infected tooth, in the mouth, or in the face.  Tenderness.  Pus drainage.  Bad breath.  Bitter taste in the mouth.  Difficulty swallowing.  Difficulty opening the mouth.  Nausea.  Vomiting.  Chills.  Swollen neck glands.  Fever. DIAGNOSIS This condition is diagnosed with examination of the infected tooth. During the exam, your dentist may tap on the infected tooth. Your dentist will also ask about your medical and dental history and may order X-rays. TREATMENT This condition is treated by eliminating the infection. This may be done with:  Antibiotic medicine.  A root canal. This may be performed to save the tooth.  Pulling (extracting) the tooth. This may also involve draining the abscess. This is done if the tooth cannot be saved. HOME CARE INSTRUCTIONS  Take medicines only as directed by your dentist.  If you were prescribed antibiotic medicine, finish all of it even if you start to feel better.  Rinse your  mouth (gargle) often with salt water to relieve pain or swelling.  Do not drive or operate heavy machinery while taking pain medicine.  Do not apply heat to the outside of your mouth.  Keep all follow-up visits as directed by your dentist. This is important. SEEK MEDICAL CARE IF:  Your pain is worse and is not helped by medicine. SEEK IMMEDIATE MEDICAL CARE IF:  You have a fever or chills.  Your symptoms suddenly get worse.  You have a very bad headache.  You have problems breathing or swallowing.  You have trouble opening your mouth.  You have swelling in your neck or around your eye.   This information is not intended to replace advice given to you by your health care provider. Make sure you discuss any questions you have with your health care provider.   Document Released: 04/28/2005 Document Revised: 09/12/2014 Document Reviewed: 04/25/2014 Elsevier Interactive Patient Education [employer].

## 2015-08-25 NOTE — ED Provider Notes (Signed)
CSN: [id number]     Arrival date & time 08/25/15  2017 History   First MD Initiated Contact with Patient 08/25/15 2130     Chief Complaint  Patient presents with  . Oral Swelling    HPI   [name] is a 54 y.o. female with a PMH of HLD, HTN who presents to the ED with left lower dental abscess. She notes her symptoms started 3 days ago, at which time she was evaluated at urgent care and given clindamycin. She reports increased pain and swelling today. She denies exacerbating factors, though states she has had difficulty sleeping due to pain. She has tried diclofenac with no significant symptom relief. She denies fever, chills, difficulty swallowing, trouble handling her secretions, abdominal pain, nausea, vomiting, diarrhea.   Past Medical History  Diagnosis Date  . Hyperlipidemia     takes Atorvastatin daily  . Arthritis     right knee  . Paraesophageal hernia   . Acid reflux     takes Zantac and Omeprazole daily  . History of blood clots     15yrs ago and in left leg  . History of bronchitis 3+yrs ago  . History of migraine     has been a while since last one  . Weakness     numbness and tingling in both feet  . Joint pain   . Joint swelling   . Chronic back pain     DDD  . Hypertension     takes Lisinopril and HCTZ daily  . Insomnia     takes Elavil nightly as needed  . Anxiety     takes Citaopram daily   Past Surgical History  Procedure Laterality Date  . Total knee arthroplasty Left 04/08/2004  . Ganglion cyst excision Left 01/13/2002  . Colonoscopy N/A 05/29/2014    Procedure: COLONOSCOPY;  Surgeon: Daneil Dolin, MD;  [facility];  Service: Endoscopy;  Laterality: N/A;  215pm- Pt is working until 12:00 so she can't come any earlier  . Esophagogastroduodenoscopy N/A 05/29/2014    Procedure: ESOPHAGOGASTRODUODENOSCOPY (EGD);  Surgeon: Daneil Dolin, MD;  [facility];  Service: Endoscopy;  Laterality: N/A;  Venia Minks dilation N/A 05/29/2014    Procedure: Venia Minks DILATION;  Surgeon: Daneil Dolin, MD;  [facility];  Service: Endoscopy;  Laterality: N/A;  . Knee arthroscopy with medial menisectomy Right 06/28/2014    Procedure: RIGHT KNEE ARTHROSCOPY WITH PARTIAL MEDIAL MENISCECTOMY AND CHONDROPLASTY;  Surgeon: Marianna Payment, MD;  Location: [place];  Service: Orthopedics;  Laterality: Right;  . Chondroplasty Right 06/28/2014    Procedure: CHONDROPLASTY;  Surgeon: Marianna Payment, MD;  Location: [place];  Service: Orthopedics;  Laterality: Right;  . Partial knee arthroplasty Right 09/15/2014    Procedure: RIGHT UNICOMPARTMENTAL KNEE ARTHROPLASTY;  Surgeon: Leandrew Koyanagi, MD;  Location: [place];  Service: Orthopedics;  Laterality: Right;   Family History  Problem Relation Age of Onset  . Cancer Father     lung  . Breast cancer Sister   . Cancer Maternal Grandfather     lung  . Cancer Paternal Grandfather     lung  . Colon cancer Neg Hx   . Diabetes Son    Social History  Substance Use Topics  . Smoking status: Never Smoker   . Smokeless tobacco: Never Used  . Alcohol Use: No   OB History    Gravida Para Term Preterm AB TAB SAB  Ectopic Multiple Living   6 5 5  1  1   5       Review of Systems  Constitutional: Negative for fever and chills.  HENT: Positive for dental problem. Negative for trouble swallowing.   Respiratory: Negative for shortness of breath.   Gastrointestinal: Negative for nausea, vomiting, abdominal pain and diarrhea.  All other systems reviewed and are negative.     Allergies  Review of patient's allergies indicates no known allergies.  Home Medications   Prior to Admission medications   Medication Sig Start Date End Date Taking? Authorizing Provider  aspirin EC 325 MG tablet Take 1 tablet (325 mg total) by mouth 2 (two) times daily. Patient not taking: Reported on 01/17/2015 06/28/14   Leandrew Koyanagi, MD  atorvastatin (LIPITOR) 10 MG tablet TAKE 1  TABLET BY MOUTH DAILY AT 6 PM. Patient not taking: Reported on 01/17/2015 05/09/14   Lorayne Marek, MD  clindamycin (CLEOCIN) 150 MG capsule Take 3 capsules (450 mg total) by mouth 3 (three) times daily. 08/25/15   Marella Chimes, PA-C  cyclobenzaprine (FLEXERIL) 5 MG tablet Take 1 tablet (5 mg total) by mouth 3 (three) times daily as needed for muscle spasms. 06/29/15   Billy Fischer, MD  diclofenac (CATAFLAM) 50 MG tablet Take 1 tablet (50 mg total) by mouth 3 (three) times daily. For dental pain 08/22/15   Billy Fischer, MD  DULoxetine HCl 40 MG CPEP Take 40 mg by mouth daily. 03/29/15   Lance Bosch, NP  hydrochlorothiazide (HYDRODIURIL) 25 MG tablet Take 1 tablet (25 mg total) by mouth daily. 03/29/15   Lance Bosch, NP  ibuprofen (ADVIL,MOTRIN) 800 MG tablet Take 1 tablet (800 mg total) by mouth 3 (three) times daily. 06/29/15   Billy Fischer, MD  lisinopril (PRINIVIL,ZESTRIL) 40 MG tablet Take 1 tablet (40 mg total) by mouth daily. 03/29/15   Lance Bosch, NP  omeprazole (PRILOSEC) 20 MG capsule Take 1 capsule (20 mg total) by mouth 2 (two) times daily before a meal. 08/02/15   Lance Bosch, NP  oxyCODONE-acetaminophen (PERCOCET/ROXICET) 5-325 MG tablet Take 1-2 tablets by mouth every 4 (four) hours as needed for severe pain. 08/25/15   Marella Chimes, PA-C  ranitidine (ZANTAC) 150 MG tablet Take 150 mg by mouth 2 (two) times daily. Reported on 05/09/2015    Historical Provider, MD    BP 157/108 mmHg  Pulse 99  Temp(Src) 98.5 F (36.9 C) (Oral)  Resp 18  Ht 5\' 4"  (1.626 m)  Wt 90.719 kg  BMI 34.31 kg/m2  SpO2 97%  LMP 02/07/2013 Physical Exam  Constitutional: She is oriented to person, place, and time. She appears well-developed and well-nourished. No distress.  HENT:  Head: Normocephalic and atraumatic.  Right Ear: External ear normal.  Left Ear: External ear normal.  Nose: Nose normal.  Mouth/Throat: Uvula is midline, oropharynx is clear and moist and mucous  membranes are normal. No oropharyngeal exudate, posterior oropharyngeal edema, posterior oropharyngeal erythema or tonsillar abscesses.    Edema, erythema, and TTP to gumline of left lower bicuspid. No swelling to floor of mouth.  Eyes: Conjunctivae and EOM are normal. Right eye exhibits no discharge. Left eye exhibits no discharge. No scleral icterus.  Neck: Normal range of motion. Neck supple.  Cardiovascular: Normal rate and regular rhythm.   Pulmonary/Chest: Effort normal and breath sounds normal. No respiratory distress.  Musculoskeletal: Normal range of motion. She exhibits no edema or tenderness.  Neurological: She  is alert and oriented to person, place, and time.  Skin: Skin is warm and dry. She is not diaphoretic.  Psychiatric: She has a normal mood and affect. Her behavior is normal.  Nursing note and vitals reviewed.   ED Course  Procedures (including critical care time)  Labs Review Labs Reviewed - No data to display  Imaging Review No results found.    EKG Interpretation None      MDM   Final diagnoses:  Dental abscess    55 year old female presents with left-sided dental abscess. States she was evaluated for this on Wednesday, though reports her symptoms worsened. She was discharged with clindamycin 150 QID and diclofenac. Denies fever, chills, difficulty swallowing, trouble handling her secretions, shortness of breath, abdominal pain, nausea, vomiting, diarrhea. Patient is afebrile. Vital signs stable. On exam, she has edema, erythema, and tenderness to palpation to the gumline of her left lower bicuspid. No swelling to floor of mouth to suggest ludwig's angina. Patient handling her secretions well. Patient is nontoxic and well-appearing, feel she is stable for discharge at this time. Will increase clindamycin to 450 TID and give short course of pain medication. Patient to follow-up with dentist. Strict return precautions discussed. Patient verbalizes her  understanding and is in agreement with plan.  BP 157/108 mmHg  Pulse 99  Temp(Src) 98.5 F (36.9 C) (Oral)  Resp 18  Ht 5\' 4"  (1.626 m)  Wt 90.719 kg  BMI 34.31 kg/m2  SpO2 97%  LMP 02/07/2013    Marella Chimes, PA-C 08/25/15 2349  Harvel Quale, MD 08/26/15 1547

## 2015-08-25 NOTE — ED Notes (Signed)
Pt in with L lower dental abscess with acute swelling today. States was seen x 3 days ago and given abx, but pain and swelling persist. Airway intact.

## 2015-08-29 ENCOUNTER — Other Ambulatory Visit: Payer: Self-pay

## 2015-08-29 NOTE — Telephone Encounter (Signed)
Patient didn't answer. Left message for patient to return my call.   Note to patient:  Please tell patient that a request for referral to a dentist for Dr. Doreene Burke was sent and I'm waiting for an approval or denial.

## 2015-08-30 ENCOUNTER — Telehealth: Payer: Self-pay | Admitting: Internal Medicine

## 2015-08-30 NOTE — Telephone Encounter (Signed)
Pt. Returned call and was giving the information for the dentist. Pt. Also stated she went to the ED. Please f/u

## 2015-08-30 NOTE — Telephone Encounter (Signed)
Pt was refer to see a Rheumatologist by NP Chari Manning and The referral was sent to Dr Hurley Cisco and they are requesting to run Titers so they can determine if patient will be schedule thanks .

## 2015-09-03 MED FILL — ?HYDROCHLOROTHIAZIDE 25 MG: 25 MG | 30 days supply | Qty: 30 | Fill #5

## 2015-09-03 MED FILL — LISINOPRIL 40 MG TABLET: 40 | 30 days supply | Qty: 30 | Fill #5

## 2015-09-03 MED FILL — ?DULOXETINE HCL DR 20 MG CA: 20 | 30 days supply | Qty: 60 | Fill #1

## 2015-09-05 ENCOUNTER — Other Ambulatory Visit: Payer: Self-pay

## 2015-09-05 DIAGNOSIS — K047 Periapical abscess without sinus: Secondary | ICD-10-CM

## 2015-09-05 NOTE — Progress Notes (Unsigned)
Contact patient at home number after 3pm and cell number before 3pm.

## 2015-09-05 NOTE — Telephone Encounter (Signed)
Called patient, patient didn't answer. A message was left for the patient to return my call.  Note patient:  Please make an appt for lab visit only for Rheumatologist.

## 2015-09-12 ENCOUNTER — Other Ambulatory Visit: Payer: Self-pay | Admitting: Family Medicine

## 2015-09-12 ENCOUNTER — Ambulatory Visit: Payer: 59 | Attending: Internal Medicine

## 2015-09-12 DIAGNOSIS — R768 Other specified abnormal immunological findings in serum: Secondary | ICD-10-CM | POA: Insufficient documentation

## 2015-09-12 NOTE — Progress Notes (Signed)
Patient walks into the clinic stating "she needs to have labs drawn". Review of chart indicates she was referred to rheumatology by her PCP and as per referral coordinator "titers"are needed to determine if referral is appropriate.

## 2015-09-12 NOTE — Progress Notes (Signed)
Pt is here for lab work only. Pt was advsd to make ov with PCP asap. Pt stated she understood.

## 2015-09-12 NOTE — Addendum Note (Signed)
Addended by: Tommas Olp B on: 09/12/2015 04:14 PM   Modules accepted: Orders

## 2015-09-13 LAB — ANA: ANA: NEGATIVE

## 2015-09-13 LAB — RHEUMATOID FACTOR

## 2015-09-13 LAB — SEDIMENTATION RATE: SED RATE: 18 mm/h (ref 0–30)

## 2015-09-14 ENCOUNTER — Other Ambulatory Visit: Payer: Self-pay | Admitting: Physician Assistant

## 2015-09-14 ENCOUNTER — Telehealth: Payer: Self-pay | Admitting: Family Medicine

## 2015-09-14 LAB — ANCA SCREEN W REFLEX TITER: ANCA SCREEN: POSITIVE — AB

## 2015-09-14 LAB — C-ANCA TITER

## 2015-09-14 MED ORDER — CLINDAMYCIN HCL 150 MG PO CAPS: 450.0000 mg | ORAL_CAPSULE | Freq: Three times a day (TID) | ORAL | Status: DC

## 2015-09-14 MED FILL — CLINDAMYCIN HCL 150 MG CAPS: 150 | 10 days supply | Qty: 90 | Fill #0

## 2015-09-14 NOTE — Telephone Encounter (Signed)
Pt still waiting  For a Dentist to call her and her face is swollen. Patient is requesting an antibiotic until the Dentist call her please, call her at [phone] thank you

## 2015-09-17 ENCOUNTER — Other Ambulatory Visit: Payer: Self-pay | Admitting: Internal Medicine

## 2015-09-17 DIAGNOSIS — S025XXS Fracture of tooth (traumatic), sequela: Secondary | ICD-10-CM

## 2015-09-17 NOTE — Telephone Encounter (Signed)
Please forward lab most recent lab result to the Rheumatologist, it contains all the titers requested. Thank you

## 2015-09-17 NOTE — Progress Notes (Signed)
Patient has been referred to the Dentist

## 2015-09-18 NOTE — Telephone Encounter (Signed)
Placed call to patient, patient did not answer. Couldn't leave a message for patient, patient phone rang and picked up and then disconnected.   Note to patient:  Referral Notes     Type Date User   General 09/18/2015 11:58 AM Maren Reamer E        Note   Sent Urgent Referral to [facility] ph. # [place], [place] [address] They will contact the patient to schedule an appointment I don't know how long it will take.  Please let patient know that a request is being sent to her provider for approval for antibiotic for the pain and swelling.

## 2015-09-18 NOTE — Telephone Encounter (Signed)
Done  Fax the labs to Dr Hurley Cisco  Thank you

## 2015-09-19 NOTE — Telephone Encounter (Signed)
Placed call to patient, patient did not answer.Couldn't leave a message. Phone disconnected.

## 2015-09-19 NOTE — Telephone Encounter (Signed)
I have never seen this patient before. She needs an office visit

## 2015-09-25 ENCOUNTER — Other Ambulatory Visit: Payer: Self-pay | Admitting: *Deleted

## 2015-09-25 NOTE — Telephone Encounter (Signed)
Patient verified DOB Patient confirmed going to the dentist on last Friday and returning this week. No further questions at this time.

## 2015-10-03 ENCOUNTER — Ambulatory Visit: Payer: 59 | Admitting: Family Medicine

## 2015-10-04 MED FILL — LISINOPRIL 40 MG TABLET: 40 | 30 days supply | Qty: 30 | Fill #6

## 2015-10-04 MED FILL — ?DULOXETINE HCL DR 20 MG CA: 20 | 30 days supply | Qty: 60 | Fill #2

## 2015-10-04 MED FILL — ?HYDROCHLOROTHIAZIDE 25 MG: 25 MG | 30 days supply | Qty: 30 | Fill #6

## 2015-11-07 MED FILL — ?OMEPRAZOLE DR 20 MG CAPSUL: 20 | 30 days supply | Qty: 60 | Fill #1

## 2015-11-07 MED FILL — HYDROCHLOROTHIAZIDE 25 MG T: 25 | 30 days supply | Qty: 30 | Fill #7

## 2015-11-07 MED FILL — ?CYCLOBENZAPRINE 10 MG TABL: 10 | 30 days supply | Qty: 60 | Fill #0

## 2015-11-07 MED FILL — ?DULOXETINE HCL DR 20 MG CA: 20 | 30 days supply | Qty: 60 | Fill #3

## 2015-11-07 MED FILL — LISINOPRIL 40 MG TABLET: 40 | 30 days supply | Qty: 30 | Fill #7

## 2015-11-12 ENCOUNTER — Ambulatory Visit (HOSPITAL_COMMUNITY)
Admission: EM | Admit: 2015-11-12 | Discharge: 2015-11-12 | Disposition: A | Discharge status: Home / Self Care | Payer: BLUE CROSS/BLUE SHIELD | Attending: Family Medicine | Admitting: Family Medicine

## 2015-11-12 ENCOUNTER — Encounter (HOSPITAL_COMMUNITY): Payer: Self-pay | Admitting: Emergency Medicine

## 2015-11-12 ENCOUNTER — Ambulatory Visit (HOSPITAL_COMMUNITY): Payer: BLUE CROSS/BLUE SHIELD

## 2015-11-12 DIAGNOSIS — I10 Essential (primary) hypertension: Secondary | ICD-10-CM | POA: Insufficient documentation

## 2015-11-12 DIAGNOSIS — K219 Gastro-esophageal reflux disease without esophagitis: Secondary | ICD-10-CM | POA: Diagnosis not present

## 2015-11-12 DIAGNOSIS — G4700 Insomnia, unspecified: Secondary | ICD-10-CM | POA: Insufficient documentation

## 2015-11-12 DIAGNOSIS — Z833 Family history of diabetes mellitus: Secondary | ICD-10-CM | POA: Insufficient documentation

## 2015-11-12 DIAGNOSIS — F419 Anxiety disorder, unspecified: Secondary | ICD-10-CM | POA: Insufficient documentation

## 2015-11-12 DIAGNOSIS — S60222A Contusion of left hand, initial encounter: Secondary | ICD-10-CM | POA: Diagnosis not present

## 2015-11-12 DIAGNOSIS — Z7982 Long term (current) use of aspirin: Secondary | ICD-10-CM | POA: Insufficient documentation

## 2015-11-12 DIAGNOSIS — M79642 Pain in left hand: Secondary | ICD-10-CM | POA: Diagnosis present

## 2015-11-12 DIAGNOSIS — S63502A Unspecified sprain of left wrist, initial encounter: Secondary | ICD-10-CM | POA: Diagnosis not present

## 2015-11-12 DIAGNOSIS — N6320 Unspecified lump in the left breast, unspecified quadrant: Secondary | ICD-10-CM

## 2015-11-12 DIAGNOSIS — M1711 Unilateral primary osteoarthritis, right knee: Secondary | ICD-10-CM | POA: Diagnosis not present

## 2015-11-12 DIAGNOSIS — E785 Hyperlipidemia, unspecified: Secondary | ICD-10-CM | POA: Diagnosis not present

## 2015-11-12 DIAGNOSIS — Z79899 Other long term (current) drug therapy: Secondary | ICD-10-CM | POA: Diagnosis not present

## 2015-11-12 DIAGNOSIS — W109XXA Fall (on) (from) unspecified stairs and steps, initial encounter: Secondary | ICD-10-CM | POA: Insufficient documentation

## 2015-11-12 NOTE — ED Provider Notes (Signed)
CSN: [id number]     Arrival date & time 11/12/15  1634 History   First MD Initiated Contact with Patient 11/12/15 1734     Chief Complaint  Patient presents with  . Hand Pain  . Hand Injury   (Consider location/radiation/quality/duration/timing/severity/associated sxs/prior Treatment) Patient is a 55 y.o. female presenting with hand pain. The history is provided by the patient.  Hand Pain This is a new problem. The current episode started more than 2 days ago (fell going up stairs at home last week, gradually getting worse. to left hand and wrist). The problem has been gradually worsening. The symptoms are aggravated by twisting and bending.    Past Medical History  Diagnosis Date  . Hyperlipidemia     takes Atorvastatin daily  . Arthritis     right knee  . Paraesophageal hernia   . Acid reflux     takes Zantac and Omeprazole daily  . History of blood clots     26yrs ago and in left leg  . History of bronchitis 3+yrs ago  . History of migraine     has been a while since last one  . Weakness     numbness and tingling in both feet  . Joint pain   . Joint swelling   . Chronic back pain     DDD  . Hypertension     takes Lisinopril and HCTZ daily  . Insomnia     takes Elavil nightly as needed  . Anxiety     takes Citaopram daily   Past Surgical History  Procedure Laterality Date  . Total knee arthroplasty Left 04/08/2004  . Ganglion cyst excision Left 01/13/2002  . Colonoscopy N/A 05/29/2014    Procedure: COLONOSCOPY;  Surgeon: Daneil Dolin, MD;  [facility];  Service: Endoscopy;  Laterality: N/A;  215pm- Pt is working until 12:00 so she can't come any earlier  . Esophagogastroduodenoscopy N/A 05/29/2014    Procedure: ESOPHAGOGASTRODUODENOSCOPY (EGD);  Surgeon: Daneil Dolin, MD;  [facility];  Service: Endoscopy;  Laterality: N/A;  Venia Minks dilation N/A 05/29/2014    Procedure: Venia Minks DILATION;  Surgeon: Daneil Dolin, MD;  [facility];  Service: Endoscopy;  Laterality: N/A;  . Knee arthroscopy with medial menisectomy Right 06/28/2014    Procedure: RIGHT KNEE ARTHROSCOPY WITH PARTIAL MEDIAL MENISCECTOMY AND CHONDROPLASTY;  Surgeon: Marianna Payment, MD;  Location: [place];  Service: Orthopedics;  Laterality: Right;  . Chondroplasty Right 06/28/2014    Procedure: CHONDROPLASTY;  Surgeon: Marianna Payment, MD;  Location: [place];  Service: Orthopedics;  Laterality: Right;  . Partial knee arthroplasty Right 09/15/2014    Procedure: RIGHT UNICOMPARTMENTAL KNEE ARTHROPLASTY;  Surgeon: Leandrew Koyanagi, MD;  Location: [place];  Service: Orthopedics;  Laterality: Right;   Family History  Problem Relation Age of Onset  . Cancer Father     lung  . Breast cancer Sister   . Cancer Maternal Grandfather     lung  . Cancer Paternal Grandfather     lung  . Colon cancer Neg Hx   . Diabetes Son    Social History  Substance Use Topics  . Smoking status: Never Smoker   . Smokeless tobacco: Never Used  . Alcohol Use: No   OB History    Gravida Para Term Preterm AB TAB SAB Ectopic Multiple Living   6 5 5  1  1    5  Review of Systems  Constitutional: Negative.   Musculoskeletal: Positive for joint swelling.  Skin: Positive for wound.  All other systems reviewed and are negative.   Allergies  Review of patient's allergies indicates no known allergies.  Home Medications   Prior to Admission medications   Medication Sig Start Date End Date Taking? Authorizing Provider  aspirin EC 325 MG tablet Take 1 tablet (325 mg total) by mouth 2 (two) times daily. Patient not taking: Reported on 01/17/2015 06/28/14   Leandrew Koyanagi, MD  atorvastatin (LIPITOR) 10 MG tablet TAKE 1 TABLET BY MOUTH DAILY AT 6 PM. Patient not taking: Reported on 01/17/2015 05/09/14   Lorayne Marek, MD  clindamycin (CLEOCIN) 150 MG capsule Take 3 capsules (450 mg total) by mouth 3 (three) times daily. 09/14/15   Tiffany Daneil Dan,  PA-C  cyclobenzaprine (FLEXERIL) 5 MG tablet Take 1 tablet (5 mg total) by mouth 3 (three) times daily as needed for muscle spasms. 06/29/15   Billy Fischer, MD  diclofenac (CATAFLAM) 50 MG tablet Take 1 tablet (50 mg total) by mouth 3 (three) times daily. For dental pain 08/22/15   Billy Fischer, MD  DULoxetine HCl 40 MG CPEP Take 40 mg by mouth daily. 03/29/15   Lance Bosch, NP  hydrochlorothiazide (HYDRODIURIL) 25 MG tablet Take 1 tablet (25 mg total) by mouth daily. 03/29/15   Lance Bosch, NP  ibuprofen (ADVIL,MOTRIN) 800 MG tablet Take 1 tablet (800 mg total) by mouth 3 (three) times daily. 06/29/15   Billy Fischer, MD  lisinopril (PRINIVIL,ZESTRIL) 40 MG tablet Take 1 tablet (40 mg total) by mouth daily. 03/29/15   Lance Bosch, NP  omeprazole (PRILOSEC) 20 MG capsule Take 1 capsule (20 mg total) by mouth 2 (two) times daily before a meal. 08/02/15   Lance Bosch, NP  oxyCODONE-acetaminophen (PERCOCET/ROXICET) 5-325 MG tablet Take 1-2 tablets by mouth every 4 (four) hours as needed for severe pain. 08/25/15   Marella Chimes, PA-C  ranitidine (ZANTAC) 150 MG tablet Take 150 mg by mouth 2 (two) times daily. Reported on 05/09/2015    Historical Provider, MD   Meds Ordered and Administered this Visit  Medications - No data to display  BP 102/70 mmHg  Temp(Src) 98.7 F (37.1 C) (Oral)  Resp 16  Ht 5\' 4"  (1.626 m)  Wt 200 lb (90.719 kg)  BMI 34.31 kg/m2  SpO2 100%  LMP 02/07/2013 No data found.   Physical Exam  Constitutional: She is oriented to person, place, and time. She appears well-developed and well-nourished. No distress.  Musculoskeletal: She exhibits tenderness.       Hands: Neurological: She is alert and oriented to person, place, and time.  Skin:  Left palmar ecchymosis and sts to hand.  Nursing note and vitals reviewed.   ED Course  Procedures (including critical care time)  Labs Review Labs Reviewed - No data to display  Imaging Review Dg Wrist  Complete Left  11/12/2015  CLINICAL DATA:  Fall walking up stairs 5 days ago. Left wrist pain and bruising. EXAM: LEFT WRIST - COMPLETE 3+ VIEW COMPARISON:  Hand series performed today. FINDINGS: Advanced degenerative changes at the first carpometacarpal joint. No definite acute fracture. Bone fragmentation likely related to the advanced degenerative changes. Soft tissues are intact. IMPRESSION: Advanced degenerative changes at the first carpometacarpal joint. No acute bony abnormality. Electronically Signed   By: Rolm Baptise M.D.   On: 11/12/2015 18:21   Dg Hand Complete Left  11/12/2015  CLINICAL DATA:  [place] walking up stairs 5 days ago. Continued pain. Bruising on left wrist area and base of thumb. EXAM: LEFT HAND - COMPLETE 3+ VIEW COMPARISON:  None. FINDINGS: Advanced osteoarthritic changes at the first carpometacarpal joint with joint space loss, osteophyte formation, and fragmentation. No definite acute bony abnormality. No definite acute fracture, subluxation or dislocation. IMPRESSION: Advanced degenerative changes at the first carpometacarpal joint. No acute bony abnormality. Electronically Signed   By: Rolm Baptise M.D.   On: 11/12/2015 18:20     Visual Acuity Review  Right Eye Distance:   Left Eye Distance:   Bilateral Distance:    Right Eye Near:   Left Eye Near:    Bilateral Near:         MDM   1. Sprain of wrist joint, left, initial encounter   2. Contusion, hand, left, initial encounter        Billy Fischer, MD 11/12/15 [phone]

## 2015-11-12 NOTE — Discharge Instructions (Signed)
Soak in warm water and wear splint for comfort, see your doctor if further problems.

## 2015-11-12 NOTE — ED Notes (Signed)
Pt. Stated, I fell last week on my left hand, its still swollen and feels bad

## 2015-11-12 NOTE — ED Notes (Signed)
Patient transported to X-ray 

## 2015-12-05 ENCOUNTER — Other Ambulatory Visit: Payer: Self-pay | Admitting: Internal Medicine

## 2015-12-05 MED FILL — ?OMEPRAZOLE DR 20 MG CAPSUL: 20 | 30 days supply | Qty: 60 | Fill #2

## 2015-12-05 MED FILL — LISINOPRIL 40 MG TABLET: 40 | 30 days supply | Qty: 30 | Fill #8

## 2015-12-05 MED FILL — HYDROCHLOROTHIAZIDE 25 MG T: 25 | 30 days supply | Qty: 30 | Fill #8

## 2015-12-05 MED FILL — ?CYCLOBENZAPRINE 10 MG TABL: 10 | 30 days supply | Qty: 60 | Fill #1

## 2015-12-05 NOTE — Telephone Encounter (Signed)
Rx Requests 

## 2016-01-09 MED FILL — LISINOPRIL 40 MG TABLET: 40 | 30 days supply | Qty: 30 | Fill #9

## 2016-01-09 MED FILL — OMEPRAZOLE DR 20 MG CAPSULE: 20 | 30 days supply | Qty: 30 | Fill #1

## 2016-01-09 MED FILL — HYDROCHLOROTHIAZIDE 25 MG T: 25 | 30 days supply | Qty: 30 | Fill #9

## 2016-02-05 ENCOUNTER — Other Ambulatory Visit: Payer: Self-pay | Admitting: Internal Medicine

## 2016-02-05 MED FILL — LISINOPRIL 40 MG TABLET: 40 | 30 days supply | Qty: 30 | Fill #10

## 2016-02-05 MED FILL — HYDROCHLOROTHIAZIDE 25 MG T: 25 | 30 days supply | Qty: 30 | Fill #10

## 2016-02-05 MED FILL — OMEPRAZOLE DR 20 MG CAPSULE: 20 | 30 days supply | Qty: 30 | Fill #2

## 2016-03-06 MED FILL — HYDROCHLOROTHIAZIDE 25 MG T: 25 | 30 days supply | Qty: 30 | Fill #11

## 2016-03-06 MED FILL — LISINOPRIL 40 MG TABLET: 40 | 30 days supply | Qty: 30 | Fill #11

## 2016-03-20 ENCOUNTER — Ambulatory Visit: Payer: BLUE CROSS/BLUE SHIELD | Attending: Internal Medicine

## 2016-03-31 ENCOUNTER — Ambulatory Visit (INDEPENDENT_AMBULATORY_CARE_PROVIDER_SITE_OTHER): Payer: Self-pay

## 2016-03-31 ENCOUNTER — Encounter (INDEPENDENT_AMBULATORY_CARE_PROVIDER_SITE_OTHER): Payer: Self-pay | Admitting: Orthopaedic Surgery

## 2016-03-31 ENCOUNTER — Ambulatory Visit (INDEPENDENT_AMBULATORY_CARE_PROVIDER_SITE_OTHER): Payer: BLUE CROSS/BLUE SHIELD | Admitting: Orthopaedic Surgery

## 2016-03-31 DIAGNOSIS — M7541 Impingement syndrome of right shoulder: Secondary | ICD-10-CM | POA: Insufficient documentation

## 2016-03-31 DIAGNOSIS — G8929 Other chronic pain: Secondary | ICD-10-CM

## 2016-03-31 DIAGNOSIS — M25511 Pain in right shoulder: Secondary | ICD-10-CM

## 2016-03-31 DIAGNOSIS — Z96651 Presence of right artificial knee joint: Secondary | ICD-10-CM

## 2016-03-31 MED ORDER — METHOCARBAMOL 500 MG PO TABS: 500.0000 mg | ORAL_TABLET | Freq: Four times a day (QID) | ORAL | 2 refills | Status: DC | PRN

## 2016-03-31 MED ORDER — DICLOFENAC SODIUM 75 MG PO TBEC: 75.0000 mg | DELAYED_RELEASE_TABLET | Freq: Two times a day (BID) | ORAL | 2 refills | Status: DC

## 2016-03-31 NOTE — Progress Notes (Signed)
Office Visit Note   Patient: [name]           Date of Birth: [birth year]           MRN: [id number] Visit Date: 03/31/2016              Requested by: Lance Bosch, NP No address on file PCP: Lance Bosch, NP   Assessment & Plan: Visit Diagnoses:  1. Impingement syndrome of right shoulder   2. Chronic right shoulder pain   3. Status post right partial knee replacement     Plan: For the right knee she is doing very well. I'm happy with how she has recovered. For the right shoulder now that she has failed conservative treatment would recommend MRI to rule out a rotator cuff tear. Follow-up after MRI.  Follow-Up Instructions: Return in about 2 weeks (around 04/14/2016) for review MRI right shoulder.   Orders:  Orders Placed This Encounter  Procedures  . XR Shoulder Right  . XR Knee 1-2 Views Right  . MR Shoulder Right w/o contrast   Meds ordered this encounter  Medications  . diclofenac (VOLTAREN) 75 MG EC tablet    Sig: Take 1 tablet (75 mg total) by mouth 2 (two) times daily.    Dispense:  30 tablet    Refill:  2  . methocarbamol (ROBAXIN) 500 MG tablet    Sig: Take 1 tablet (500 mg total) by mouth every 6 (six) hours as needed for muscle spasms.    Dispense:  30 tablet    Refill:  2      Procedures: No procedures performed   Clinical Data: No additional findings.   Subjective: Chief Complaint  Patient presents with  . Right Shoulder - Pain  . Right Knee - Pain    The patient is following up for right partial knee replacement and new problem of right shoulder pain. In terms of the right shoulder she is having constant pain that radiates into the neck and down into the deltoid region for several weeks now. She has had a subacromial injection which did not give her any relief. She has difficulty sleeping. She's been self treating with heat and ice with partial relief. Denies any radicular symptoms. In terms of the right knee she is doing very well she is  very happy with her progress she is ambulating without a limp. Her left knee actually gives her more problems.    Review of Systems  Constitutional: Negative.   HENT: Negative.   Eyes: Negative.   Respiratory: Negative.   Cardiovascular: Negative.   Endocrine: Negative.   Musculoskeletal: Negative.   Neurological: Negative.   Hematological: Negative.   Psychiatric/Behavioral: Negative.   All other systems reviewed and are negative.    Objective: Vital Signs: LMP 02/07/2013   Physical Exam  Constitutional: She is oriented to person, place, and time. She appears well-developed and well-nourished.  HENT:  Head: Atraumatic.  Eyes: EOM are normal.  Neck: Neck supple.  Cardiovascular: Intact distal pulses.   Pulmonary/Chest: Effort normal.  Abdominal: Soft.  Neurological: She is alert and oriented to person, place, and time.  Skin: Skin is warm. Capillary refill takes less than 2 seconds.  Psychiatric: She has a normal mood and affect. Her behavior is normal. Judgment and thought content normal.  Nursing note and vitals reviewed.   Right Knee Exam   Comments:  The range of motion. Well-healed scar.Swelling.   Right Shoulder Exam   Comments:  Right shoulder exam shows no focal motor or sensory deficits. Positive Hawkins and positive Neer impingement signs. Rotator cuff testing is intact with mild pain. Positive cross adduction sign.      Specialty Comments:  No specialty comments available.  Imaging: Xr Knee 1-2 Views Right  Result Date: 03/31/2016 Stable right partial knee replacement  Xr Shoulder Right  Result Date: 03/31/2016 No acute bony abnormalities. No degenerative joint disease.    PMFS History: Patient Active Problem List   Diagnosis Date Noted  . Impingement syndrome of right shoulder 03/31/2016  . Status post right partial knee replacement 09/15/2014  . Acute medial meniscal injury of knee 06/14/2014  . Status post total knee replacement  using cement 06/08/2014  . Primary osteoarthritis of right knee 06/08/2014  . Screening for colon cancer   . Schatzki's ring   . Hiatal hernia   . Dysphagia, pharyngoesophageal phase 05/26/2014  . Encounter for screening colonoscopy 05/26/2014  . Adrenal adenoma 05/01/2014  . Breast lump 05/01/2014  . Metatarsalgia of both feet 01/12/2014  . Bilateral leg pain 01/11/2014  . Bilateral edema of lower extremity 01/11/2014  . Other osteoarthritis of spine, thoracolumbar region 12/26/2013  . Hypercholesteremia 05/19/2013  . Periodic health assessment, general screening, adult 04/13/2013  . GERD (gastroesophageal reflux disease) 04/13/2013  . HTN (hypertension) 04/13/2013  . Dyspnea 04/13/2013   Past Medical History:  Diagnosis Date  . Acid reflux    takes Zantac and Omeprazole daily  . Anxiety    takes Citaopram daily  . Arthritis    right knee  . Chronic back pain    DDD  . History of blood clots    19yrs ago and in left leg  . History of bronchitis 3+yrs ago  . History of migraine    has been a while since last one  . Hyperlipidemia    takes Atorvastatin daily  . Hypertension    takes Lisinopril and HCTZ daily  . Insomnia    takes Elavil nightly as needed  . Joint pain   . Joint swelling   . Paraesophageal hernia   . Weakness    numbness and tingling in both feet    Family History  Problem Relation Age of Onset  . Cancer Father     lung  . Breast cancer Sister   . Cancer Maternal Grandfather     lung  . Cancer Paternal Grandfather     lung  . Colon cancer Neg Hx   . Diabetes Son     Past Surgical History:  Procedure Laterality Date  . CHONDROPLASTY Right 06/28/2014   Procedure: CHONDROPLASTY;  Surgeon: Marianna Payment, MD;  Location: [facility];  Service: Orthopedics;  Laterality: Right;  . COLONOSCOPY N/A 05/29/2014   Procedure: COLONOSCOPY;  Surgeon: Daneil Dolin, MD;  [facility];  Service: Endoscopy;  Laterality: N/A;   215pm- Pt is working until 12:00 so she can't come any earlier  . ESOPHAGOGASTRODUODENOSCOPY N/A 05/29/2014   Procedure: ESOPHAGOGASTRODUODENOSCOPY (EGD);  Surgeon: Daneil Dolin, MD;  [facility];  Service: Endoscopy;  Laterality: N/A;  . GANGLION CYST EXCISION Left 01/13/2002  . KNEE ARTHROSCOPY WITH MEDIAL MENISECTOMY Right 06/28/2014   Procedure: RIGHT KNEE ARTHROSCOPY WITH PARTIAL MEDIAL MENISCECTOMY AND CHONDROPLASTY;  Surgeon: Marianna Payment, MD;  Location: [facility];  Service: Orthopedics;  Laterality: Right;  . MALONEY DILATION N/A 05/29/2014   Procedure: Venia Minks DILATION;  Surgeon: Daneil Dolin, MD;  [facility];  Service: Endoscopy;  Laterality: N/A;  . PARTIAL KNEE ARTHROPLASTY Right 09/15/2014   Procedure: RIGHT UNICOMPARTMENTAL KNEE ARTHROPLASTY;  Surgeon: Leandrew Koyanagi, MD;  Location: [facility];  Service: Orthopedics;  Laterality: Right;  . TOTAL KNEE ARTHROPLASTY Left 04/08/2004   Social History   Occupational History  . Not on file.   Social History Main Topics  . Smoking status: Never Smoker  . Smokeless tobacco: Never Used  . Alcohol use No  . Drug use: No  . Sexual activity: Not Currently    Birth control/ protection: None

## 2016-04-07 ENCOUNTER — Other Ambulatory Visit: Payer: Self-pay | Admitting: Internal Medicine

## 2016-04-07 DIAGNOSIS — I10 Essential (primary) hypertension: Secondary | ICD-10-CM

## 2016-04-07 MED FILL — ?OMEPRAZOLE DR 20 MG CAPSUL: 20 | 30 days supply | Qty: 60 | Fill #0

## 2016-04-11 ENCOUNTER — Other Ambulatory Visit: Payer: Self-pay | Admitting: Internal Medicine

## 2016-04-11 ENCOUNTER — Ambulatory Visit: Payer: BLUE CROSS/BLUE SHIELD | Admitting: Family Medicine

## 2016-04-11 DIAGNOSIS — I10 Essential (primary) hypertension: Secondary | ICD-10-CM

## 2016-04-11 MED FILL — HYDROCHLOROTHIAZIDE 25 MG T: 25 | 30 days supply | Qty: 30 | Fill #0

## 2016-04-11 MED FILL — LISINOPRIL 40 MG TABLET: 40 | 30 days supply | Qty: 30 | Fill #0

## 2016-04-11 NOTE — Telephone Encounter (Signed)
Patient is needing refill for hydrochlorothiazide and lisinopril. Patient would like to use [facility] on [address].

## 2016-04-12 ENCOUNTER — Inpatient Hospital Stay: Admission: RE | Admit: 2016-04-12 | Payer: BLUE CROSS/BLUE SHIELD | Source: Ambulatory Visit

## 2016-04-14 ENCOUNTER — Telehealth: Payer: Self-pay | Admitting: Family Medicine

## 2016-04-14 NOTE — Telephone Encounter (Signed)
Pt is wanting to talk to [facility] foster.she is upset [place] because her prescriptions havent been refilled.  Please call her at [phone]

## 2016-04-16 ENCOUNTER — Encounter: Payer: Self-pay | Admitting: Family Medicine

## 2016-04-16 ENCOUNTER — Ambulatory Visit: Payer: BLUE CROSS/BLUE SHIELD | Attending: Family Medicine | Admitting: Family Medicine

## 2016-04-16 VITALS — BP 152/84 | HR 78 | Temp 97.7°F | Resp 16 | Wt 219.0 lb

## 2016-04-16 DIAGNOSIS — Z79899 Other long term (current) drug therapy: Secondary | ICD-10-CM | POA: Diagnosis not present

## 2016-04-16 DIAGNOSIS — N390 Urinary tract infection, site not specified: Secondary | ICD-10-CM | POA: Insufficient documentation

## 2016-04-16 DIAGNOSIS — K449 Diaphragmatic hernia without obstruction or gangrene: Secondary | ICD-10-CM | POA: Diagnosis not present

## 2016-04-16 DIAGNOSIS — N3000 Acute cystitis without hematuria: Secondary | ICD-10-CM

## 2016-04-16 DIAGNOSIS — I10 Essential (primary) hypertension: Secondary | ICD-10-CM | POA: Diagnosis present

## 2016-04-16 LAB — POCT URINALYSIS DIPSTICK
Bilirubin, UA: NEGATIVE
GLUCOSE UA: NEGATIVE
Ketones, UA: NEGATIVE
Nitrite, UA: NEGATIVE
PH UA: 5
PROTEIN UA: NEGATIVE
RBC UA: NEGATIVE
SPEC GRAV UA: 1.01
UROBILINOGEN UA: 0.2

## 2016-04-16 LAB — BASIC METABOLIC PANEL
BUN: 13 mg/dL (ref 7–25)
CALCIUM: 9.1 mg/dL (ref 8.6–10.4)
CO2: 23 mmol/L (ref 20–31)
Chloride: 107 mmol/L (ref 98–110)
Creat: 0.91 mg/dL (ref 0.50–1.05)
GLUCOSE: 90 mg/dL (ref 65–99)
Potassium: 4.3 mmol/L (ref 3.5–5.3)
SODIUM: 141 mmol/L (ref 135–146)

## 2016-04-16 MED ORDER — CIPROFLOXACIN HCL 250 MG PO TABS: 250.0000 mg | ORAL_TABLET | Freq: Two times a day (BID) | ORAL | 0 refills | Status: DC

## 2016-04-16 MED ORDER — HYDROCHLOROTHIAZIDE 25 MG PO TABS: 25.0000 mg | ORAL_TABLET | Freq: Every day | ORAL | 2 refills | Status: DC

## 2016-04-16 MED ORDER — LISINOPRIL 40 MG PO TABS: 40.0000 mg | ORAL_TABLET | Freq: Every day | ORAL | 2 refills | Status: DC

## 2016-04-16 NOTE — Patient Instructions (Addendum)
Hypertension Hypertension is another name for high blood pressure. High blood pressure forces your heart to work harder to pump blood. A blood pressure reading has two numbers, which includes a higher number over a lower number (example: 110/72). Follow these instructions at home:  Have your blood pressure rechecked by your doctor.  Only take medicine as told by your doctor. Follow the directions carefully. The medicine does not work as well if you skip doses. Skipping doses also puts you at risk for problems.  Do not smoke.  Monitor your blood pressure at home as told by your doctor. Contact a doctor if:  You think you are having a reaction to the medicine you are taking.  You have repeat headaches or feel dizzy.  You have puffiness (swelling) in your ankles.  You have trouble with your vision. Get help right away if:  You get a very bad headache and are confused.  You feel weak, numb, or faint.  You get chest or belly (abdominal) pain.  You throw up (vomit).  You cannot breathe very well. This information is not intended to replace advice given to you by your health care provider. Make sure you discuss any questions you have with your health care provider. Document Released: 10/15/2007 Document Revised: 10/04/2015 Document Reviewed: 02/18/2013 Elsevier Interactive Patient Education  2017 [employer] Urinary Tract Infection, Adult Introduction A urinary tract infection (UTI) is an infection of any part of the urinary tract. The urinary tract includes the:  Kidneys.  Ureters.  Bladder.  Urethra. These organs make, store, and get rid of pee (urine) in the body. Follow these instructions at home:  Take over-the-counter and prescription medicines only as told by your doctor.  If you were prescribed an antibiotic medicine, take it as told by your doctor. Do not stop taking the antibiotic even if you start to feel better.  Avoid the following  drinks:  Alcohol.  Caffeine.  Tea.  Carbonated drinks.  Drink enough fluid to keep your pee clear or pale yellow.  Keep all follow-up visits as told by your doctor. This is important.  Make sure to:  Empty your bladder often and completely. Do not to hold pee for long periods of time.  Empty your bladder before and after sex.  Wipe from front to back after a bowel movement if you are female. Use each tissue one time when you wipe. Contact a doctor if:  You have back pain.  You have a fever.  You feel sick to your stomach (nauseous).  You throw up (vomit).  Your symptoms do not get better after 3 days.  Your symptoms go away and then come back. Get help right away if:  You have very bad back pain.  You have very bad lower belly (abdominal) pain.  You are throwing up and cannot keep down any medicines or water. This information is not intended to replace advice given to you by your health care provider. Make sure you discuss any questions you have with your health care provider. Document Released: 10/15/2007 Document Revised: 10/04/2015 Document Reviewed: 03/19/2015  2017 Elsevier  Follow up in 3 months for hypertension.

## 2016-04-17 LAB — MICROALBUMIN / CREATININE URINE RATIO
Creatinine, Urine: 63 mg/dL (ref 20–320)
MICROALB/CREAT RATIO: 3 ug/mg{creat} (ref <30)
Microalb, Ur: 0.2 mg/dL

## 2016-04-19 NOTE — Progress Notes (Signed)
Subjective:  Patient ID: [name], female    DOB: 05-Nov-1960  Age: 55 y.o. MRN: [id number]  CC: Establish Care and Hypertension   HPI [name] presents for complaints of dysuria. She reports urinary frequency and urgency and lower back pain. She reports symptoms have lasted for 1 week. She denies any hematuria. She reports being unaware if her urine is cloudy or malodorous. She denies any vaginal discharge. She also comes for HTN follow up. She denies any CP, SOB, or swelling of the extremities. She denies any dizziness or lightheadedness. She reports getting her medication refills today. She reports having a history of hiatal hernia which is being managed by a specialist clinic. She reports being dissatisfied and is requesting a referral to another specialist clinic.  Outpatient Medications Prior to Visit  Medication Sig Dispense Refill  . aspirin EC 325 MG tablet Take 1 tablet (325 mg total) by mouth 2 (two) times daily. (Patient not taking: Reported on 03/31/2016) 84 tablet 0  . atorvastatin (LIPITOR) 10 MG tablet TAKE 1 TABLET BY MOUTH DAILY AT 6 PM. (Patient not taking: Reported on 03/31/2016) 30 tablet 5  . clindamycin (CLEOCIN) 150 MG capsule Take 3 capsules (450 mg total) by mouth 3 (three) times daily. (Patient not taking: Reported on 03/31/2016) 90 capsule 0  . cyclobenzaprine (FLEXERIL) 5 MG tablet Take 1 tablet (5 mg total) by mouth 3 (three) times daily as needed for muscle spasms. (Patient not taking: Reported on 03/31/2016) 30 tablet 0  . diclofenac (CATAFLAM) 50 MG tablet Take 1 tablet (50 mg total) by mouth 3 (three) times daily. For dental pain (Patient not taking: Reported on 03/31/2016) 15 tablet 0  . diclofenac (VOLTAREN) 75 MG EC tablet Take 1 tablet (75 mg total) by mouth 2 (two) times daily. 30 tablet 2  . DULoxetine (CYMBALTA) 20 MG capsule TAKE 2 CAPSULES BY MOUTH DAILY (Patient not taking: Reported on 03/31/2016) 60 capsule 2  . ibuprofen (ADVIL,MOTRIN) 800 MG  tablet Take 1 tablet (800 mg total) by mouth 3 (three) times daily. (Patient not taking: Reported on 03/31/2016) 30 tablet 1  . methocarbamol (ROBAXIN) 500 MG tablet Take 1 tablet (500 mg total) by mouth every 6 (six) hours as needed for muscle spasms. 30 tablet 2  . omeprazole (PRILOSEC) 20 MG capsule TAKE ONE CAPSULE BY MOUTH TWICE A DAY BEFORE A MEAL 60 capsule 0  . oxyCODONE-acetaminophen (PERCOCET/ROXICET) 5-325 MG tablet Take 1-2 tablets by mouth every 4 (four) hours as needed for severe pain. (Patient not taking: Reported on 03/31/2016) 8 tablet 0  . ranitidine (ZANTAC) 150 MG tablet Take 150 mg by mouth 2 (two) times daily. Reported on 05/09/2015    . hydrochlorothiazide (HYDRODIURIL) 25 MG tablet TAKE 1 TABLET BY MOUTH DAILY. 30 tablet 0  . lisinopril (PRINIVIL,ZESTRIL) 40 MG tablet TAKE 1 TABLET BY MOUTH DAILY. 30 tablet 0   No facility-administered medications prior to visit.     ROS Review of Systems  Constitutional: Negative.   Respiratory: Negative.   Cardiovascular: Negative.   Gastrointestinal: Negative.        Pt.reports history of hiatal hernia.   Genitourinary: Positive for dysuria, frequency and urgency. Negative for hematuria and vaginal discharge.    Objective:  BP (!) 152/84 (BP Location: Left Arm, Cuff Size: Large)   Pulse 78   Temp 97.7 F (36.5 C) (Oral)   Resp 16   Wt 219 lb (99.3 kg)   LMP 02/07/2013   SpO2 99%  BMI 37.59 kg/m   BP/Weight 04/16/2016 11/12/2015 [id number]  Systolic BP 0000000 [id number]  Diastolic BP 84 70 [id number]  Wt. (Lbs) 219 200 200  BMI 37.59 34.31 34.31    Physical Exam  Constitutional: She is oriented to person, place, and time. She appears well-developed and well-nourished.  Cardiovascular: Normal rate, regular rhythm and normal heart sounds.   Pulmonary/Chest: Effort normal and breath sounds normal.  Abdominal: Soft. Bowel sounds are normal. She exhibits no mass. There is tenderness (suprapubic tenderness.). There is no guarding.    Negative for CVA tenderness.  Musculoskeletal: Normal range of motion.  Neurological: She is alert and oriented to person, place, and time.   Assessment & Plan:   1. Essential hypertension  - Microalbumin / creatinine urine ratio - Basic Metabolic Panel - lisinopril (PRINIVIL,ZESTRIL) 40 MG tablet; Take 1 tablet (40 mg total) by mouth daily.  Dispense: 30 tablet; Refill: 2 - hydrochlorothiazide (HYDRODIURIL) 25 MG tablet; Take 1 tablet (25 mg total) by mouth daily.  Dispense: 30 tablet; Refill: 2   2. Urinary tract infection -POCT urinalysis dipstick -ciprofloxacin (CIPRO) 250 MG tablet; Take 1 tablet (250 mg total) by mouth 2 (two) times daily. Dispense:  6 tablet; Refill:  0  3. Hiatal hernia - Ambulatory referral to Gastroenterology  Meds ordered this encounter  Medications  . ciprofloxacin (CIPRO) 250 MG tablet    Sig: Take 1 tablet (250 mg total) by mouth 2 (two) times daily.    Dispense:  6 tablet    Refill:  0    Order Specific Question:   Supervising Provider    Answer:   Tresa Garter [id number]  . lisinopril (PRINIVIL,ZESTRIL) 40 MG tablet    Sig: Take 1 tablet (40 mg total) by mouth daily.    Dispense:  30 tablet    Refill:  2    Must have office visit for refills    Order Specific Question:   Supervising Provider    Answer:   Tresa Garter [id number]  . hydrochlorothiazide (HYDRODIURIL) 25 MG tablet    Sig: Take 1 tablet (25 mg total) by mouth daily.    Dispense:  30 tablet    Refill:  2    Must have office visit for refills    Order Specific Question:   Supervising Provider    Answer:   Tresa Garter [id number]    Follow-up:  Follow up in 3 months for hypertension.  Alfonse Spruce FNP

## 2016-04-21 ENCOUNTER — Other Ambulatory Visit: Payer: Self-pay | Admitting: Family Medicine

## 2016-04-21 ENCOUNTER — Ambulatory Visit (INDEPENDENT_AMBULATORY_CARE_PROVIDER_SITE_OTHER): Payer: BLUE CROSS/BLUE SHIELD | Admitting: Orthopaedic Surgery

## 2016-04-21 ENCOUNTER — Telehealth: Payer: Self-pay

## 2016-04-21 NOTE — Telephone Encounter (Signed)
Contacted pt to go over lab results pt is aware of results and doesn't have any questions or concerns  Pt states there is burning going toward her butthole the symptom started yesterday. Pt states she is still going constantly.

## 2016-04-21 NOTE — Telephone Encounter (Signed)
Have patient to make a clinic appointment to evaluate.

## 2016-04-22 ENCOUNTER — Other Ambulatory Visit: Payer: Self-pay | Admitting: Family Medicine

## 2016-04-22 DIAGNOSIS — I10 Essential (primary) hypertension: Secondary | ICD-10-CM

## 2016-04-22 MED ORDER — HYDROCHLOROTHIAZIDE 25 MG PO TABS: 25.0000 mg | ORAL_TABLET | Freq: Every day | ORAL | 2 refills | Status: DC

## 2016-04-22 MED ORDER — LISINOPRIL 40 MG PO TABS: 40.0000 mg | ORAL_TABLET | Freq: Every day | ORAL | 2 refills | Status: DC

## 2016-04-22 NOTE — Telephone Encounter (Signed)
Could you make patient an appointment please

## 2016-04-23 ENCOUNTER — Encounter: Payer: Self-pay | Admitting: Family Medicine

## 2016-04-23 ENCOUNTER — Ambulatory Visit: Payer: BLUE CROSS/BLUE SHIELD | Admitting: Family Medicine

## 2016-04-23 ENCOUNTER — Ambulatory Visit: Payer: BLUE CROSS/BLUE SHIELD | Attending: Family Medicine | Admitting: Family Medicine

## 2016-04-23 VITALS — BP 145/97 | HR 70 | Temp 98.2°F | Resp 20 | Ht 64.0 in | Wt 214.0 lb

## 2016-04-23 DIAGNOSIS — Z7982 Long term (current) use of aspirin: Secondary | ICD-10-CM | POA: Insufficient documentation

## 2016-04-23 DIAGNOSIS — Z79899 Other long term (current) drug therapy: Secondary | ICD-10-CM | POA: Insufficient documentation

## 2016-04-23 DIAGNOSIS — R300 Dysuria: Secondary | ICD-10-CM

## 2016-04-23 DIAGNOSIS — I10 Essential (primary) hypertension: Secondary | ICD-10-CM | POA: Insufficient documentation

## 2016-04-23 DIAGNOSIS — K219 Gastro-esophageal reflux disease without esophagitis: Secondary | ICD-10-CM | POA: Diagnosis not present

## 2016-04-23 LAB — POCT URINALYSIS DIPSTICK
Bilirubin, UA: NEGATIVE
GLUCOSE UA: NEGATIVE
Ketones, UA: NEGATIVE
Leukocytes, UA: NEGATIVE
NITRITE UA: NEGATIVE
PROTEIN UA: NEGATIVE
SPEC GRAV UA: 1.01
UROBILINOGEN UA: 0.2
pH, UA: 5.5

## 2016-04-23 MED ORDER — OMEPRAZOLE 20 MG PO CPDR: 20.0000 mg | DELAYED_RELEASE_CAPSULE | Freq: Two times a day (BID) | ORAL | 1 refills | Status: DC

## 2016-04-23 MED ORDER — HYDROCHLOROTHIAZIDE 50 MG PO TABS: 50.0000 mg | ORAL_TABLET | Freq: Every day | ORAL | 2 refills | Status: DC

## 2016-04-23 MED ORDER — NITROFURANTOIN MONOHYD MACRO 100 MG PO CAPS: 100.0000 mg | ORAL_CAPSULE | Freq: Two times a day (BID) | ORAL | 0 refills | Status: AC

## 2016-04-23 NOTE — Patient Instructions (Signed)
Urinary Tract Infection, Adult Introduction A urinary tract infection (UTI) is an infection of any part of the urinary tract. The urinary tract includes the:  Kidneys.  Ureters.  Bladder.  Urethra. These organs make, store, and get rid of pee (urine) in the body. Follow these instructions at home:  Take over-the-counter and prescription medicines only as told by your doctor.  If you were prescribed an antibiotic medicine, take it as told by your doctor. Do not stop taking the antibiotic even if you start to feel better.  Avoid the following drinks:  Alcohol.  Caffeine.  Tea.  Carbonated drinks.  Drink enough fluid to keep your pee clear or pale yellow.  Keep all follow-up visits as told by your doctor. This is important.  Make sure to:  Empty your bladder often and completely. Do not to hold pee for long periods of time.  Empty your bladder before and after sex.  Wipe from front to back after a bowel movement if you are female. Use each tissue one time when you wipe. Contact a doctor if:  You have back pain.  You have a fever.  You feel sick to your stomach (nauseous).  You throw up (vomit).  Your symptoms do not get better after 3 days.  Your symptoms go away and then come back. Get help right away if:  You have very bad back pain.  You have very bad lower belly (abdominal) pain.  You are throwing up and cannot keep down any medicines or water. This information is not intended to replace advice given to you by your health care provider. Make sure you discuss any questions you have with your health care provider. Document Released: 10/15/2007 Document Revised: 10/04/2015 Document Reviewed: 03/19/2015  2017 Elsevier  

## 2016-04-23 NOTE — Progress Notes (Signed)
Subjective:  Patient ID: [name], female    DOB: [birth year]  Age: 55 y.o. MRN: [id number]  CC: Dysuria (x 5 days)   HPI [name] comes in for follow up for dysuria . She reports completing her course of antibiotics on Saturday but reports still having symptoms of dysuria on Sunday. She reports she did not notice any cloudy or foul smelling urine. She denies any hematuria. She does report frequent urination and burning. She denies any vaginal discharge or lesions. She reports being abstinent. She denies any fevers. She denies any nausea or vomiting. She is also requesting Prilosec refill. She does report symptoms of GERD. She also reports elevated bp's measurements at home despite medication adherence.   Outpatient Medications Prior to Visit  Medication Sig Dispense Refill  . lisinopril (PRINIVIL,ZESTRIL) 40 MG tablet Take 1 tablet (40 mg total) by mouth daily. 30 tablet 2  . omeprazole (PRILOSEC) 20 MG capsule TAKE ONE CAPSULE BY MOUTH TWICE A DAY BEFORE A MEAL 60 capsule 0  . aspirin EC 325 MG tablet Take 1 tablet (325 mg total) by mouth 2 (two) times daily. (Patient not taking: Reported on 03/31/2016) 84 tablet 0  . atorvastatin (LIPITOR) 10 MG tablet TAKE 1 TABLET BY MOUTH DAILY AT 6 PM. (Patient not taking: Reported on 03/31/2016) 30 tablet 5  . ciprofloxacin (CIPRO) 250 MG tablet Take 1 tablet (250 mg total) by mouth 2 (two) times daily. (Patient not taking: Reported on 04/23/2016) 6 tablet 0  . clindamycin (CLEOCIN) 150 MG capsule Take 3 capsules (450 mg total) by mouth 3 (three) times daily. (Patient not taking: Reported on 03/31/2016) 90 capsule 0  . cyclobenzaprine (FLEXERIL) 5 MG tablet Take 1 tablet (5 mg total) by mouth 3 (three) times daily as needed for muscle spasms. (Patient not taking: Reported on 03/31/2016) 30 tablet 0  . diclofenac (CATAFLAM) 50 MG tablet Take 1 tablet (50 mg total) by mouth 3 (three) times daily. For dental pain (Patient not taking: Reported on  03/31/2016) 15 tablet 0  . diclofenac (VOLTAREN) 75 MG EC tablet Take 1 tablet (75 mg total) by mouth 2 (two) times daily. (Patient not taking: Reported on 04/23/2016) 30 tablet 2  . DULoxetine (CYMBALTA) 20 MG capsule TAKE 2 CAPSULES BY MOUTH DAILY (Patient not taking: Reported on 03/31/2016) 60 capsule 2  . ibuprofen (ADVIL,MOTRIN) 800 MG tablet Take 1 tablet (800 mg total) by mouth 3 (three) times daily. (Patient not taking: Reported on 03/31/2016) 30 tablet 1  . methocarbamol (ROBAXIN) 500 MG tablet Take 1 tablet (500 mg total) by mouth every 6 (six) hours as needed for muscle spasms. (Patient not taking: Reported on 04/23/2016) 30 tablet 2  . oxyCODONE-acetaminophen (PERCOCET/ROXICET) 5-325 MG tablet Take 1-2 tablets by mouth every 4 (four) hours as needed for severe pain. (Patient not taking: Reported on 03/31/2016) 8 tablet 0  . ranitidine (ZANTAC) 150 MG tablet Take 150 mg by mouth 2 (two) times daily. Reported on 05/09/2015    . hydrochlorothiazide (HYDRODIURIL) 25 MG tablet Take 1 tablet (25 mg total) by mouth daily. 30 tablet 2   No facility-administered medications prior to visit.     ROS Review of Systems  Constitutional: Negative.   Respiratory: Negative.   Cardiovascular: Negative.   Genitourinary: Positive for dysuria, frequency, pelvic pain and urgency. Negative for hematuria and vaginal discharge.  Skin: Negative.     Objective:  BP (!) 145/97   Pulse 70   Temp 98.2 F (36.8 C) (  Oral)   Resp 20   Ht 5\' 4"  (1.626 m)   Wt 214 lb (97.1 kg)   LMP 02/07/2013   SpO2 100%   BMI 36.73 kg/m   BP/Weight 04/23/2016 [id number] 0000000  Systolic BP [id number] 0000000 [id number]  Diastolic BP 97 84 70  Wt. (Lbs) 214 219 200  BMI 36.73 37.59 34.31    Physical Exam  Constitutional: She is oriented to person, place, and time. She appears well-developed and well-nourished.  Cardiovascular: Normal rate, regular rhythm and normal heart sounds.   Pulmonary/Chest: Effort normal and breath  sounds normal.  Abdominal: Soft. She exhibits no distension and no mass. There is tenderness (suprapubic). There is CVA tenderness (right sided).  Neurological: She is alert and oriented to person, place, and time.  Skin: Skin is warm and dry.  Psychiatric: She has a normal mood and affect. Her behavior is normal. Thought content normal.    Assessment & Plan:   Problem List Items Addressed This Visit      Cardiovascular and Mediastinum   HTN (hypertension)   Relevant Medications   hydrochlorothiazide (HYDRODIURIL) 50 MG tablet     Digestive   GERD (gastroesophageal reflux disease)   Relevant Medications   omeprazole (PRILOSEC) 20 MG capsule    Other Visit Diagnoses    Dysuria    -  Primary   Relevant Medications   nitrofurantoin, macrocrystal-monohydrate, (MACROBID) 100 MG capsule   Other Relevant Orders   POCT urinalysis dipstick (Completed)   Urine culture        -When asked, pt.reports her father had a history of nephrolithiasis. KUB suggested If symptoms worsen or          fail to improve.    Meds ordered this encounter  Medications  . omeprazole (PRILOSEC) 20 MG capsule    Sig: Take 1 capsule (20 mg total) by mouth 2 (two) times daily before a meal.    Dispense:  60 capsule    Refill:  1    Must have office visit for refills    Order Specific Question:   Supervising Provider    Answer:   Tresa Garter [id number]  . hydrochlorothiazide (HYDRODIURIL) 50 MG tablet    Sig: Take 1 tablet (50 mg total) by mouth daily.    Dispense:  30 tablet    Refill:  2    Must have office visit for refills    Order Specific Question:   Supervising Provider    Answer:   Tresa Garter [id number]  . nitrofurantoin, macrocrystal-monohydrate, (MACROBID) 100 MG capsule    Sig: Take 1 capsule (100 mg total) by mouth 2 (two) times daily.    Dispense:  10 capsule    Refill:  0    Order Specific Question:   Supervising Provider    Answer:   Tresa Garter [id number]     Follow-up: Return if symptoms worsen or fail to improve.   Alfonse Spruce FNP

## 2016-04-23 NOTE — Progress Notes (Signed)
Patient her for f/u. Refill omeprozole

## 2016-04-24 LAB — URINE CULTURE: ORGANISM ID, BACTERIA: NO GROWTH

## 2016-04-25 ENCOUNTER — Ambulatory Visit
Admission: RE | Admit: 2016-04-25 | Discharge: 2016-04-25 | Disposition: A | Discharge status: Home / Self Care | Payer: BLUE CROSS/BLUE SHIELD | Source: Ambulatory Visit | Attending: Orthopaedic Surgery | Admitting: Orthopaedic Surgery

## 2016-04-25 DIAGNOSIS — M7541 Impingement syndrome of right shoulder: Secondary | ICD-10-CM

## 2016-04-28 ENCOUNTER — Other Ambulatory Visit: Payer: Self-pay | Admitting: Obstetrics and Gynecology

## 2016-04-28 ENCOUNTER — Encounter (INDEPENDENT_AMBULATORY_CARE_PROVIDER_SITE_OTHER): Payer: Self-pay | Admitting: Orthopaedic Surgery

## 2016-04-28 ENCOUNTER — Ambulatory Visit (INDEPENDENT_AMBULATORY_CARE_PROVIDER_SITE_OTHER): Payer: BLUE CROSS/BLUE SHIELD | Admitting: Orthopaedic Surgery

## 2016-04-28 DIAGNOSIS — M7541 Impingement syndrome of right shoulder: Secondary | ICD-10-CM

## 2016-04-28 DIAGNOSIS — Z1231 Encounter for screening mammogram for malignant neoplasm of breast: Secondary | ICD-10-CM

## 2016-04-28 DIAGNOSIS — M75111 Incomplete rotator cuff tear or rupture of right shoulder, not specified as traumatic: Secondary | ICD-10-CM

## 2016-04-28 NOTE — Progress Notes (Signed)
Office Visit Note   Patient: [name]           Date of Birth: 1961-02-28           MRN: [id number] Visit Date: 04/28/2016              Requested by: Lance Bosch, NP No address on file PCP: Fredia Beets, FNP   Assessment & Plan: Visit Diagnoses:  1. Impingement syndrome of right shoulder   2. Nontraumatic incomplete tear of right rotator cuff     Plan: MRI of the right shoulder reviewed shows significant supraspinatus tendinosis and likely areas of full-thickness tear with acromial clavicular joint arthropathy type 2 acromion.  Discussed surgical versus nonsurgical treatments. We discussed surgery in detail including the risks benefits alternatives and she wishes to proceed. We will schedule this in the near future at her convenience. Plan on evaluating the rotator cuff repair as indicated in addition to subacromial decompression and distal clavicle excision.  Follow-Up Instructions: Return for 2 week postop visit.   Orders:  No orders of the defined types were placed in this encounter.  No orders of the defined types were placed in this encounter.     Procedures: No procedures performed   Clinical Data: No additional findings.   Subjective: Chief Complaint  Patient presents with  . Right Shoulder - Pain, Follow-up    Patient comes back today to review her right shoulder MRI. She still has pain that radiates shoulder. Burning sensation and is worse with activity. Pain is worse with lifting. Denies any radicular symptoms.    Review of Systems  Constitutional: Negative.   HENT: Negative.   Eyes: Negative.   Respiratory: Negative.   Cardiovascular: Negative.   Endocrine: Negative.   Musculoskeletal: Negative.   Neurological: Negative.   Hematological: Negative.   Psychiatric/Behavioral: Negative.   All other systems reviewed and are negative.    Objective: Vital Signs: LMP 02/07/2013   Physical Exam  Constitutional: She is oriented to  person, place, and time. She appears well-developed and well-nourished.  Pulmonary/Chest: Effort normal.  Neurological: She is alert and oriented to person, place, and time.  Skin: Skin is warm. Capillary refill takes less than 2 seconds.  Psychiatric: She has a normal mood and affect. Her behavior is normal. Judgment and thought content normal.  Nursing note and vitals reviewed.   Ortho Exam Exam of the right shoulder shows positive impingement positive cross adduction positive empty can. Infraspinatus is intact. Specialty Comments:  No specialty comments available.  Imaging: No results found.   PMFS History: Patient Active Problem List   Diagnosis Date Noted  . Nontraumatic incomplete tear of right rotator cuff 04/28/2016  . Impingement syndrome of right shoulder 03/31/2016  . Status post right partial knee replacement 09/15/2014  . Acute medial meniscal injury of knee 06/14/2014  . Status post total knee replacement using cement 06/08/2014  . Primary osteoarthritis of right knee 06/08/2014  . Screening for colon cancer   . Schatzki's ring   . Hiatal hernia   . Dysphagia, pharyngoesophageal phase 05/26/2014  . Encounter for screening colonoscopy 05/26/2014  . Adrenal adenoma 05/01/2014  . Breast lump 05/01/2014  . Metatarsalgia of both feet 01/12/2014  . Bilateral leg pain 01/11/2014  . Bilateral edema of lower extremity 01/11/2014  . Other osteoarthritis of spine, thoracolumbar region 12/26/2013  . Hypercholesteremia 05/19/2013  . Periodic health assessment, general screening, adult 04/13/2013  . GERD (gastroesophageal reflux disease) 04/13/2013  . HTN (hypertension)  04/13/2013  . Dyspnea 04/13/2013   Past Medical History:  Diagnosis Date  . Acid reflux    takes Zantac and Omeprazole daily  . Anxiety    takes Citaopram daily  . Arthritis    right knee  . Chronic back pain    DDD  . History of blood clots    3yrs ago and in left leg  . History of bronchitis  3+yrs ago  . History of migraine    has been a while since last one  . Hyperlipidemia    takes Atorvastatin daily  . Hypertension    takes Lisinopril and HCTZ daily  . Insomnia    takes Elavil nightly as needed  . Joint pain   . Joint swelling   . Paraesophageal hernia   . Weakness    numbness and tingling in both feet    Family History  Problem Relation Age of Onset  . Cancer Father     lung  . Breast cancer Sister   . Cancer Maternal Grandfather     lung  . Cancer Paternal Grandfather     lung  . Colon cancer Neg Hx   . Diabetes Son     Past Surgical History:  Procedure Laterality Date  . CHONDROPLASTY Right 06/28/2014   Procedure: CHONDROPLASTY;  Surgeon: Marianna Payment, MD;  Location: [facility];  Service: Orthopedics;  Laterality: Right;  . COLONOSCOPY N/A 05/29/2014   Procedure: COLONOSCOPY;  Surgeon: Daneil Dolin, MD;  [facility];  Service: Endoscopy;  Laterality: N/A;  215pm- Pt is working until 12:00 so she can't come any earlier  . ESOPHAGOGASTRODUODENOSCOPY N/A 05/29/2014   Procedure: ESOPHAGOGASTRODUODENOSCOPY (EGD);  Surgeon: Daneil Dolin, MD;  [facility];  Service: Endoscopy;  Laterality: N/A;  . GANGLION CYST EXCISION Left 01/13/2002  . KNEE ARTHROSCOPY WITH MEDIAL MENISECTOMY Right 06/28/2014   Procedure: RIGHT KNEE ARTHROSCOPY WITH PARTIAL MEDIAL MENISCECTOMY AND CHONDROPLASTY;  Surgeon: Marianna Payment, MD;  Location: [facility];  Service: Orthopedics;  Laterality: Right;  . MALONEY DILATION N/A 05/29/2014   Procedure: Venia Minks DILATION;  Surgeon: Daneil Dolin, MD;  [facility];  Service: Endoscopy;  Laterality: N/A;  . PARTIAL KNEE ARTHROPLASTY Right 09/15/2014   Procedure: RIGHT UNICOMPARTMENTAL KNEE ARTHROPLASTY;  Surgeon: Leandrew Koyanagi, MD;  Location: [facility];  Service: Orthopedics;  Laterality: Right;  . TOTAL KNEE ARTHROPLASTY Left 04/08/2004   Social History   Occupational  History  . Not on file.   Social History Main Topics  . Smoking status: Never Smoker  . Smokeless tobacco: Never Used  . Alcohol use No  . Drug use: No  . Sexual activity: Not Currently    Birth control/ protection: None

## 2016-04-28 NOTE — Telephone Encounter (Signed)
Appointment was made last week but patient cancelled it.

## 2016-04-29 ENCOUNTER — Telehealth: Payer: Self-pay

## 2016-04-29 NOTE — Telephone Encounter (Addendum)
Left message requested return call to [facility]:  Attempting to advise per FNP Mandesia Hairston:  -Urine culture obtained showed no bacterial growth.  -Follow up if symptoms do not improve.

## 2016-04-29 NOTE — Telephone Encounter (Signed)
Patient preferred to speak with nurse about lab results. Patient also has questions about medication. Please follow up.

## 2016-05-01 ENCOUNTER — Other Ambulatory Visit: Payer: Self-pay | Admitting: Family Medicine

## 2016-05-01 DIAGNOSIS — K219 Gastro-esophageal reflux disease without esophagitis: Secondary | ICD-10-CM

## 2016-05-01 MED ORDER — OMEPRAZOLE 20 MG PO CPDR: 20.0000 mg | DELAYED_RELEASE_CAPSULE | Freq: Two times a day (BID) | ORAL | 1 refills | Status: DC

## 2016-05-01 NOTE — Telephone Encounter (Signed)
Patient fully hipaa verified.  RN advised patient per  Brandi Spruce, FNP on 04/29/2016  -Urine culture obtained showed no bacterial growth.  -Follow up if symptoms do not improve  Patient verbalized understanding. -- Patient requesting Omeprazole 60 tablets be sent to [facility] on [address].  States was not at pharmacy as requested. Will send to [facility]

## 2016-05-01 NOTE — Telephone Encounter (Signed)
In epic it shows that the prescription for omeprazole 20 mg by mouth twice daily before meals (60 count) with 1 refill was sent on 04/23/16 to the [facility] on [address] [place] [address]. I don't why it wasn't received. I will reorder the medication for [name] right away.

## 2016-05-02 DIAGNOSIS — M75111 Incomplete rotator cuff tear or rupture of right shoulder, not specified as traumatic: Secondary | ICD-10-CM | POA: Diagnosis not present

## 2016-05-02 DIAGNOSIS — M7541 Impingement syndrome of right shoulder: Secondary | ICD-10-CM | POA: Diagnosis not present

## 2016-05-19 ENCOUNTER — Encounter (INDEPENDENT_AMBULATORY_CARE_PROVIDER_SITE_OTHER): Payer: Self-pay | Admitting: Orthopaedic Surgery

## 2016-05-19 ENCOUNTER — Ambulatory Visit (INDEPENDENT_AMBULATORY_CARE_PROVIDER_SITE_OTHER): Payer: BLUE CROSS/BLUE SHIELD | Admitting: Orthopaedic Surgery

## 2016-05-19 ENCOUNTER — Telehealth (INDEPENDENT_AMBULATORY_CARE_PROVIDER_SITE_OTHER): Payer: Self-pay | Admitting: Orthopaedic Surgery

## 2016-05-19 DIAGNOSIS — M75111 Incomplete rotator cuff tear or rupture of right shoulder, not specified as traumatic: Secondary | ICD-10-CM

## 2016-05-19 DIAGNOSIS — M7541 Impingement syndrome of right shoulder: Secondary | ICD-10-CM

## 2016-05-19 DIAGNOSIS — Z96651 Presence of right artificial knee joint: Secondary | ICD-10-CM

## 2016-05-19 NOTE — Telephone Encounter (Signed)
Please send pt RX to [facility] on [address], she had appt and she came back and stated she forgot to get it but I could not reach you on the line.  Pt number is [phone]

## 2016-05-19 NOTE — Progress Notes (Signed)
Patient is 2 weeks status post right shoulder scope and repair of small rotator cuff tear. She is overall doing better. Mainly pain in the morning. Endorses some tingling and numbness and burning but overall doing well. Physical exam shows the incisions are well healed. She does have some good active range of motion of the shoulder for 2 weeks. At this point begin physical therapy home exercises may wean out of the sling at home. I'll see her back in 4 weeks for reevaluation.

## 2016-05-20 NOTE — Telephone Encounter (Signed)
Which medicine were you going to fax in ?

## 2016-05-20 NOTE — Telephone Encounter (Signed)
I don't remember, can you ask her what it was for

## 2016-05-21 ENCOUNTER — Telehealth (INDEPENDENT_AMBULATORY_CARE_PROVIDER_SITE_OTHER): Payer: Self-pay | Admitting: Orthopaedic Surgery

## 2016-05-21 NOTE — Telephone Encounter (Signed)
Norco 5. 1 tab po q4h prn pain. #30

## 2016-05-21 NOTE — Telephone Encounter (Signed)
See message.

## 2016-05-21 NOTE — Telephone Encounter (Signed)
Called patient no answer. LMOM. We just  need to know which medication was suppose to be sent to her pharm.

## 2016-05-21 NOTE — Telephone Encounter (Signed)
Patient called advised Dr Erlinda Hong was going to change her pain medicine. The number to contact her is [phone]

## 2016-05-22 ENCOUNTER — Ambulatory Visit (HOSPITAL_COMMUNITY): Payer: BLUE CROSS/BLUE SHIELD

## 2016-05-22 MED ORDER — HYDROCODONE-ACETAMINOPHEN 5-325 MG PO TABS: ORAL_TABLET | ORAL | 0 refills | Status: DC

## 2016-05-22 NOTE — Telephone Encounter (Signed)
PENDING SIGNATURE

## 2016-05-22 NOTE — Telephone Encounter (Signed)
WILL BE READY FOR PICK UP Friday PM

## 2016-05-22 NOTE — Addendum Note (Signed)
Addended by: Precious Bard on: 05/22/2016 01:30 PM   Modules accepted: Orders

## 2016-06-17 ENCOUNTER — Ambulatory Visit (INDEPENDENT_AMBULATORY_CARE_PROVIDER_SITE_OTHER): Payer: BLUE CROSS/BLUE SHIELD | Admitting: Orthopaedic Surgery

## 2016-06-17 ENCOUNTER — Ambulatory Visit (INDEPENDENT_AMBULATORY_CARE_PROVIDER_SITE_OTHER): Payer: Self-pay

## 2016-06-17 DIAGNOSIS — M75111 Incomplete rotator cuff tear or rupture of right shoulder, not specified as traumatic: Secondary | ICD-10-CM

## 2016-06-17 MED ORDER — METHOCARBAMOL 500 MG PO TABS: 500.0000 mg | ORAL_TABLET | Freq: Four times a day (QID) | ORAL | 2 refills | Status: DC | PRN

## 2016-06-17 MED ORDER — HYDROCODONE-ACETAMINOPHEN 5-325 MG PO TABS: 1.0000 | ORAL_TABLET | Freq: Two times a day (BID) | ORAL | 0 refills | Status: DC | PRN

## 2016-06-17 NOTE — Progress Notes (Signed)
Patient is 6 weeks s/p right rotator cuff repair with recent fall 1 week ago.  Right shoulder throbs and radiates down to elbow.  No focal deficits.  SS and IS testing is as expected for 6 weeks.  No acute changes or worrisome signs.  Robaxin and norco refilled.  Heat compresses.  F/u 2 weeks for recheck.  Continue to progress with PT.

## 2016-06-23 ENCOUNTER — Encounter: Payer: Self-pay | Admitting: Adult Health

## 2016-06-23 ENCOUNTER — Ambulatory Visit (INDEPENDENT_AMBULATORY_CARE_PROVIDER_SITE_OTHER): Payer: BLUE CROSS/BLUE SHIELD | Admitting: Adult Health

## 2016-06-23 VITALS — BP 118/80 | HR 84 | Ht 64.0 in | Wt 215.9 lb

## 2016-06-23 DIAGNOSIS — I10 Essential (primary) hypertension: Secondary | ICD-10-CM

## 2016-06-23 DIAGNOSIS — R5383 Other fatigue: Secondary | ICD-10-CM

## 2016-06-23 DIAGNOSIS — E7800 Pure hypercholesterolemia, unspecified: Secondary | ICD-10-CM

## 2016-06-23 DIAGNOSIS — M069 Rheumatoid arthritis, unspecified: Secondary | ICD-10-CM

## 2016-06-23 DIAGNOSIS — K219 Gastro-esophageal reflux disease without esophagitis: Secondary | ICD-10-CM

## 2016-06-23 DIAGNOSIS — Z833 Family history of diabetes mellitus: Secondary | ICD-10-CM | POA: Insufficient documentation

## 2016-06-23 DIAGNOSIS — G4700 Insomnia, unspecified: Secondary | ICD-10-CM

## 2016-06-23 DIAGNOSIS — K449 Diaphragmatic hernia without obstruction or gangrene: Secondary | ICD-10-CM

## 2016-06-23 NOTE — Assessment & Plan Note (Signed)
Reduce saturated fat/sugar/CHO intake. Increase regular exercise.

## 2016-06-23 NOTE — Assessment & Plan Note (Signed)
Continue to reduce saturated fats in diet. Please schedule fasting lab appt with nurse as earliest convenience. Previously on Atorvastatin 10mg -not currently taking.

## 2016-06-23 NOTE — Assessment & Plan Note (Signed)
If insomnia continues even after lifestyle modifications (reduction of caffeine, good sleep hygiene) and trial on new medication from Rheumatologist-then please call clinic in 2 weeks.

## 2016-06-23 NOTE — Patient Instructions (Addendum)
Hypertension Hypertension is another name for high blood pressure. High blood pressure forces your heart to work harder to pump blood. A blood pressure reading has two numbers, which includes a higher number over a lower number (example: 110/72). Follow these instructions at home:  Have your blood pressure rechecked by your doctor.  Only take medicine as told by your doctor. Follow the directions carefully. The medicine does not work as well if you skip doses. Skipping doses also puts you at risk for problems.  Do not smoke.  Monitor your blood pressure at home as told by your doctor. Contact a doctor if:  You think you are having a reaction to the medicine you are taking.  You have repeat headaches or feel dizzy.  You have puffiness (swelling) in your ankles.  You have trouble with your vision. Get help right away if:  You get a very bad headache and are confused.  You feel weak, numb, or faint.  You get chest or belly (abdominal) pain.  You throw up (vomit).  You cannot breathe very well. This information is not intended to replace advice given to you by your health care provider. Make sure you discuss any questions you have with your health care provider. Document Released: 10/15/2007 Document Revised: 10/04/2015 Document Reviewed: 02/18/2013 Elsevier Interactive Patient Education  2017 Elsevier Inc.   Hiatal Hernia A hiatal hernia occurs when part of your stomach slides above the muscle that separates your abdomen from your chest (diaphragm). You can be born with a hiatal hernia (congenital), or it may develop over time. In almost all cases of hiatal hernia, only the top part of the stomach pushes through.  Many people have a hiatal hernia with no symptoms. The larger the hernia, the more likely that you will have symptoms. In some cases, a hiatal hernia allows stomach acid to flow back into the tube that carries food from your mouth to your stomach (esophagus). This may  cause heartburn symptoms. Severe heartburn symptoms may mean you have developed a condition called gastroesophageal reflux disease (GERD).  CAUSES  Hiatal hernias are caused by a weakness in the opening (hiatus) where your esophagus passes through your diaphragm to attach to the upper part of your stomach. You may be born with a weakness in your hiatus, or a weakness can develop. RISK FACTORS Older age is a major risk factor for a hiatal hernia. Anything that increases pressure on your diaphragm can also increase your risk of a hiatal hernia. This includes:  Pregnancy.  Excess weight.  Frequent constipation. SIGNS AND SYMPTOMS  People with a hiatal hernia often have no symptoms. If symptoms develop, they are almost always caused by GERD. They may include:  Heartburn.  Belching.  Indigestion.  Trouble swallowing.  Coughing or wheezing.  Sore throat.  Hoarseness.  Chest pain. DIAGNOSIS  A hiatal hernia is sometimes found during an exam for another problem. Your health care provider may suspect a hiatal hernia if you have symptoms of GERD. Tests may be done to diagnose GERD. These may include:  X-rays of your stomach or chest.  An upper gastrointestinal (GI) series. This is an X-ray exam of your GI tract involving the use of a chalky liquid that you swallow. The liquid shows up clearly on the X-ray.  Endoscopy. This is a procedure to look into your stomach using a thin, flexible tube that has a tiny camera and light on the end of it. TREATMENT  If you have no symptoms, you  may not need treatment. If you have symptoms, treatment may include:  Dietary and lifestyle changes to help reduce GERD symptoms.  Medicines. These may include:  Over-the-counter antacids.  Medicines that make your stomach empty more quickly.  Medicines that block the production of stomach acid (H2 blockers).  Stronger medicines to reduce stomach acid (proton pump inhibitors).  You may need surgery  to repair the hernia if other treatments are not helping. HOME CARE INSTRUCTIONS   Take all medicines as directed by your health care provider.  Quit smoking, if you smoke.  Try to achieve and maintain a healthy body weight.  Eat frequent small meals instead of three large meals a day. This keeps your stomach from getting too full.  Eat slowly.  Do not lie down right after eating.  Do noteat 1-2 hours before bed.   Do not drink beverages with caffeine. These include cola, coffee, cocoa, and tea.  Do not drink alcohol.  Avoid foods that can make symptoms of GERD worse. These may include:  Fatty foods.  Citrus fruits.  Other foods and drinks that contain acid.  Avoid putting pressure on your belly. Anything that puts pressure on your belly increases the amount of acid that may be pushed up into your esophagus.   Avoid bending over, especially after eating.  Raise the head of your bed by putting blocks under the legs. This keeps your head and esophagus higher than your stomach.  Do not wear tight clothing around your chest or stomach.  Try not to strain when having a bowel movement, when urinating, or when lifting heavy objects. SEEK MEDICAL CARE IF:  Your symptoms are not controlled with medicines or lifestyle changes.  You are having trouble swallowing.  You have coughing or wheezing that will not go away. SEEK IMMEDIATE MEDICAL CARE IF:  Your pain is getting worse.  Your pain spreads to your arms, neck, jaw, teeth, or back.  You have shortness of breath.  You sweat for no reason.  You feel sick to your stomach (nauseous) or vomit.  You vomit blood.  You have bright red blood in your stools.  You have black, tarry stools.  This information is not intended to replace advice given to you by your health care provider. Make sure you discuss any questions you have with your health care provider. Document Released: 07/19/2003 Document Revised: 08/20/2015  Document Reviewed: 04/15/2013 Elsevier Interactive Patient Education  2017 [place].  Insomnia Insomnia is a sleep disorder that makes it difficult to fall asleep or to stay asleep. Insomnia can cause tiredness (fatigue), low energy, difficulty concentrating, mood swings, and poor performance at work or school. There are three different ways to classify insomnia:  Difficulty falling asleep.  Difficulty staying asleep.  Waking up too early in the morning. Any type of insomnia can be long-term (chronic) or short-term (acute). Both are common. Short-term insomnia usually lasts for three months or less. Chronic insomnia occurs at least three times a week for longer than three months. What are the causes? Insomnia may be caused by another condition, situation, or substance, such as:  Anxiety.  Certain medicines.  Gastroesophageal reflux disease (GERD) or other gastrointestinal conditions.  Asthma or other breathing conditions.  Restless legs syndrome, sleep apnea, or other sleep disorders.  Chronic pain.  Menopause. This may include hot flashes.  Stroke.  Abuse of alcohol, tobacco, or illegal drugs.  Depression.  Caffeine.  Neurological disorders, such as Alzheimer disease.  An overactive thyroid (hyperthyroidism).  The cause of insomnia may not be known. What increases the risk? Risk factors for insomnia include:  Gender. Women are more commonly affected than men.  Age. Insomnia is more common as you get older.  Stress. This may involve your professional or personal life.  Income. Insomnia is more common in people with lower income.  Lack of exercise.  Irregular work schedule or night shifts.  Traveling between different time zones. What are the signs or symptoms? If you have insomnia, trouble falling asleep or trouble staying asleep is the main symptom. This may lead to other symptoms, such as:  Feeling fatigued.  Feeling nervous about going to  sleep.  Not feeling rested in the morning.  Having trouble concentrating.  Feeling irritable, anxious, or depressed. How is this treated? Treatment for insomnia depends on the cause. If your insomnia is caused by an underlying condition, treatment will focus on addressing the condition. Treatment may also include:  Medicines to help you sleep.  Counseling or therapy.  Lifestyle adjustments. Follow these instructions at home:  Take medicines only as directed by your health care provider.  Keep regular sleeping and waking hours. Avoid naps.  Keep a sleep diary to help you and your health care provider figure out what could be causing your insomnia. Include:  When you sleep.  When you wake up during the night.  How well you sleep.  How rested you feel the next day.  Any side effects of medicines you are taking.  What you eat and drink.  Make your bedroom a comfortable place where it is easy to fall asleep:  Put up shades or special blackout curtains to block light from outside.  Use a white noise machine to block noise.  Keep the temperature cool.  Exercise regularly as directed by your health care provider. Avoid exercising right before bedtime.  Use relaxation techniques to manage stress. Ask your health care provider to suggest some techniques that may work well for you. These may include:  Breathing exercises.  Routines to release muscle tension.  Visualizing peaceful scenes.  Cut back on alcohol, caffeinated beverages, and cigarettes, especially close to bedtime. These can disrupt your sleep.  Do not overeat or eat spicy foods right before bedtime. This can lead to digestive discomfort that can make it hard for you to sleep.  Limit screen use before bedtime. This includes:  Watching TV.  Using your smartphone, tablet, and computer.  Stick to a routine. This can help you fall asleep faster. Try to do a quiet activity, brush your teeth, and go to bed at  the same time each night.  Get out of bed if you are still awake after 15 minutes of trying to sleep. Keep the lights down, but try reading or doing a quiet activity. When you feel sleepy, go back to bed.  Make sure that you drive carefully. Avoid driving if you feel very sleepy.  Keep all follow-up appointments as directed by your health care provider. This is important. Contact a health care provider if:  You are tired throughout the day or have trouble in your daily routine due to sleepiness.  You continue to have sleep problems or your sleep problems get worse. Get help right away if:  You have serious thoughts about hurting yourself or someone else. This information is not intended to replace advice given to you by your health care provider. Make sure you discuss any questions you have with your health care provider. Document Released: 04/25/2000  Document Revised: 09/28/2015 Document Reviewed: 01/27/2014 Elsevier Interactive Patient Education  2017 [place]     Arthritis Introduction Arthritis means joint pain. It can also mean joint disease. A joint is a place where bones come together. People who have arthritis may have:  Red joints.  Swollen joints.  Stiff joints.  Warm joints.  A fever.  A feeling of being sick. Follow these instructions at home: Pay attention to any changes in your symptoms. Take these actions to help with your pain and swelling. Medicines  Take over-the-counter and prescription medicines only as told by your doctor.  Do not take aspirin for pain if your doctor says that you may have gout. Activity  Rest your joint if your doctor tells you to.  Avoid activities that make the pain worse.  Exercise your joint regularly as told by your doctor. Try doing exercises like:  Swimming.  Water aerobics.  Biking.  Walking. Joint Care   If your joint is swollen, keep it raised (elevated) if told by your doctor.  If your joint feels  stiff in the morning, try taking a warm shower.  If you have diabetes, do not apply heat without asking your doctor.  If told, apply heat to the joint:  Put a towel between the joint and the hot pack or heating pad.  Leave the heat on the area for 20-30 minutes.  If told, apply ice to the joint:  Put ice in a plastic bag.  Place a towel between your skin and the bag.  Leave the ice on for 20 minutes, 2-3 times per day.  Keep all follow-up visits as told by your doctor. Contact a doctor if:  The pain gets worse.  You have a fever. Get help right away if:  You have very bad pain in your joint.  You have swelling in your joint.  Your joint is red.  Many joints become painful and swollen.  You have very bad back pain.  Your leg is very weak.  You cannot control your pee (urine) or poop (stool). This information is not intended to replace advice given to you by your health care provider. Make sure you discuss any questions you have with your health care provider. Document Released: 07/23/2009 Document Revised: 10/04/2015 Document Reviewed: 07/24/2014  2017 Elsevier    Will call when labs result.  Need to schedule a fasting lab nurse appt. If insomnia continues even after lifestyle modifications (reduction of caffeine, good sleep hygiene) and trial on new medication from Rheumatologist-then please call clinic in 2 weeks. Recommend home ROM exercises, YouTube stretching videos to help alleviate arthritis pain. Follow-up annually, or sooner if needed.

## 2016-06-23 NOTE — Assessment & Plan Note (Signed)
Transferring care to [facility].   Continue to avoid acidic/spicy foods.  Continue daily Omeprazole.

## 2016-06-23 NOTE — Assessment & Plan Note (Signed)
Increase water intake.  Eat healthy, well-balanced meals. Increase regular movement.

## 2016-06-23 NOTE — Assessment & Plan Note (Signed)
Continue with Rheumatologist as directed. Recommend home ROM and stretching.

## 2016-06-23 NOTE — Progress Notes (Signed)
Subjective:    Patient ID: [name], female    DOB: [birth year], 56 y.o.   MRN: [id number]  HPI:L  [name] presents to establish as a new pt.  She has hx of RA, hiatal hernia/GERD, hyperlipidemia, chronic musculoskeletal pain, and HTN.  She recently established with a Rheumatologist and will establish [facility].   She is compliant on medications and denies SE.     Patient Care Team    Relationship Specialty Notifications Start End  Odella Aquas, NP PCP - General Family Medicine  06/23/16   Daneil Dolin, MD Consulting Physician Gastroenterology  03/08/14   Leandrew Koyanagi, MD Attending Physician Orthopedic Surgery  06/23/16   Hurley Cisco, MD Consulting Physician Rheumatology  06/23/16     Patient Active Problem List   Diagnosis Date Noted  . Family history of diabetes mellitus 06/23/2016  . Other fatigue 06/23/2016  . Insomnia 06/23/2016  . Rheumatoid arthritis ([place]) 06/23/2016  . Nontraumatic incomplete tear of right rotator cuff 04/28/2016  . Impingement syndrome of right shoulder 03/31/2016  . Status post right partial knee replacement 09/15/2014  . Acute medial meniscal injury of knee 06/14/2014  . Status post total knee replacement using cement 06/08/2014  . Primary osteoarthritis of right knee 06/08/2014  . Screening for colon cancer   . Schatzki's ring   . Hiatal hernia   . Dysphagia, pharyngoesophageal phase 05/26/2014  . Encounter for screening colonoscopy 05/26/2014  . Adrenal adenoma 05/01/2014  . Breast lump 05/01/2014  . Metatarsalgia of both feet 01/12/2014  . Bilateral leg pain 01/11/2014  . Bilateral edema of lower extremity 01/11/2014  . Other osteoarthritis of spine, thoracolumbar region 12/26/2013  . Hypercholesteremia 05/19/2013  . Periodic health assessment, general screening, adult 04/13/2013  . GERD (gastroesophageal reflux disease) 04/13/2013  . HTN (hypertension) 04/13/2013  . Dyspnea 04/13/2013     Past Medical History:   Diagnosis Date  . Acid reflux    takes Zantac and Omeprazole daily  . Anxiety    takes Citaopram daily  . Arthritis    right knee  . Chronic back pain    DDD  . Clotting disorder ([place])   . History of blood clots    60yrs ago and in left leg  . History of bronchitis 3+yrs ago  . History of migraine    has been a while since last one  . Hyperlipidemia    takes Atorvastatin daily  . Hypertension    takes Lisinopril and HCTZ daily  . Insomnia    takes Elavil nightly as needed  . Joint pain   . Joint swelling   . Paraesophageal hernia   . Weakness    numbness and tingling in both feet     Past Surgical History:  Procedure Laterality Date  . CHONDROPLASTY Right 06/28/2014   Procedure: CHONDROPLASTY;  Surgeon: Marianna Payment, MD;  Location: [place];  Service: Orthopedics;  Laterality: Right;  . COLONOSCOPY N/A 05/29/2014   Procedure: COLONOSCOPY;  Surgeon: Daneil Dolin, MD;  [facility];  Service: Endoscopy;  Laterality: N/A;  215pm- Pt is working until 12:00 so she can't come any earlier  . ESOPHAGOGASTRODUODENOSCOPY N/A 05/29/2014   Procedure: ESOPHAGOGASTRODUODENOSCOPY (EGD);  Surgeon: Daneil Dolin, MD;  [facility];  Service: Endoscopy;  Laterality: N/A;  . GANGLION CYST EXCISION Left 01/13/2002  . KNEE ARTHROSCOPY WITH MEDIAL MENISECTOMY Right 06/28/2014   Procedure: RIGHT KNEE ARTHROSCOPY WITH PARTIAL MEDIAL MENISCECTOMY  AND CHONDROPLASTY;  Surgeon: Marianna Payment, MD;  Location: [place];  Service: Orthopedics;  Laterality: Right;  . MALONEY DILATION N/A 05/29/2014   Procedure: Venia Minks DILATION;  Surgeon: Daneil Dolin, MD;  [facility];  Service: Endoscopy;  Laterality: N/A;  . PARTIAL KNEE ARTHROPLASTY Right 09/15/2014   Procedure: RIGHT UNICOMPARTMENTAL KNEE ARTHROPLASTY;  Surgeon: Leandrew Koyanagi, MD;  Location: [place];  Service: Orthopedics;  Laterality: Right;  . TOTAL KNEE ARTHROPLASTY Left  04/08/2004     Family History  Problem Relation Age of Onset  . Cancer Father     lung  . Breast cancer Sister   . Cancer Sister     breast  . Cancer Maternal Grandfather     lung  . Cancer Paternal Grandfather     lung  . Diabetes Son   . Colon cancer Neg Hx      History  Drug Use No     History  Alcohol Use No     History  Smoking Status  . Never Smoker  Smokeless Tobacco  . Never Used     Outpatient Encounter Prescriptions as of 06/23/2016  Medication Sig  . hydrochlorothiazide (HYDRODIURIL) 50 MG tablet Take 1 tablet (50 mg total) by mouth daily.  Marland Kitchen lisinopril (PRINIVIL,ZESTRIL) 40 MG tablet Take 1 tablet (40 mg total) by mouth daily.  . methocarbamol (ROBAXIN) 500 MG tablet Take 1 tablet (500 mg total) by mouth every 6 (six) hours as needed for muscle spasms.  Marland Kitchen omeprazole (PRILOSEC) 20 MG capsule Take 1 capsule (20 mg total) by mouth 2 (two) times daily before a meal.  . [DISCONTINUED] aspirin EC 325 MG tablet Take 1 tablet (325 mg total) by mouth 2 (two) times daily. (Patient not taking: Reported on 05/19/2016)  . [DISCONTINUED] atorvastatin (LIPITOR) 10 MG tablet TAKE 1 TABLET BY MOUTH DAILY AT 6 PM.  . [DISCONTINUED] ciprofloxacin (CIPRO) 250 MG tablet Take 1 tablet (250 mg total) by mouth 2 (two) times daily. (Patient not taking: Reported on 05/19/2016)  . [DISCONTINUED] clindamycin (CLEOCIN) 150 MG capsule Take 3 capsules (450 mg total) by mouth 3 (three) times daily. (Patient not taking: Reported on 05/19/2016)  . [DISCONTINUED] cyclobenzaprine (FLEXERIL) 5 MG tablet Take 1 tablet (5 mg total) by mouth 3 (three) times daily as needed for muscle spasms. (Patient not taking: Reported on 05/19/2016)  . [DISCONTINUED] diclofenac (CATAFLAM) 50 MG tablet Take 1 tablet (50 mg total) by mouth 3 (three) times daily. For dental pain (Patient not taking: Reported on 05/19/2016)  . [DISCONTINUED] diclofenac (VOLTAREN) 75 MG EC tablet Take 1 tablet (75 mg total) by mouth 2 (two)  times daily. (Patient not taking: Reported on 05/19/2016)  . [DISCONTINUED] DULoxetine (CYMBALTA) 20 MG capsule TAKE 2 CAPSULES BY MOUTH DAILY (Patient not taking: Reported on 05/19/2016)  . [DISCONTINUED] HYDROcodone-acetaminophen (NORCO) 5-325 MG tablet Take 1 tablet by mouth 2 (two) times daily as needed.  . [DISCONTINUED] HYDROcodone-acetaminophen (NORCO/VICODIN) 5-325 MG tablet 1 tab po q4h prn pain  . [DISCONTINUED] ibuprofen (ADVIL,MOTRIN) 800 MG tablet Take 1 tablet (800 mg total) by mouth 3 (three) times daily. (Patient not taking: Reported on 05/19/2016)  . [DISCONTINUED] methocarbamol (ROBAXIN) 500 MG tablet Take 1 tablet (500 mg total) by mouth every 6 (six) hours as needed for muscle spasms.  . [DISCONTINUED] oxyCODONE-acetaminophen (PERCOCET/ROXICET) 5-325 MG tablet Take 1-2 tablets by mouth every 4 (four) hours as needed for severe pain. (Patient not taking: Reported on 05/19/2016)  . [DISCONTINUED]  ranitidine (ZANTAC) 150 MG tablet Take 150 mg by mouth 2 (two) times daily. Reported on 05/09/2015   No facility-administered encounter medications on file as of 06/23/2016.     Allergies: Patient has no known allergies.  Body mass index is 37.06 kg/m.  Blood pressure 118/80, pulse 84, height 5\' 4"  (1.626 m), weight 215 lb 14.4 oz (97.9 kg), last menstrual period 02/07/2013.    Review of Systems  Constitutional: Negative for activity change, appetite change, chills, diaphoresis, fatigue, fever and unexpected weight change.  HENT: Negative for congestion.   Eyes: Negative for visual disturbance.  Respiratory: Negative for cough and shortness of breath.   Cardiovascular: Negative for chest pain, palpitations and leg swelling.  Gastrointestinal: Negative for abdominal distention.  Endocrine: Negative for cold intolerance, heat intolerance, polydipsia, polyphagia and polyuria.  Genitourinary: Negative for difficulty urinating and flank pain.  Musculoskeletal: Positive for arthralgias,  back pain, gait problem, joint swelling and myalgias. Negative for neck pain and neck stiffness.  Skin: Negative for color change, pallor, rash and wound.  Neurological: Negative for dizziness, tremors and weakness.  Psychiatric/Behavioral: Positive for sleep disturbance. Negative for agitation, behavioral problems, confusion, self-injury and suicidal ideas. The patient is not nervous/anxious.        Objective:   Physical Exam  Constitutional: She is oriented to person, place, and time. She appears well-developed and well-nourished. No distress.  HENT:  Head: Normocephalic and atraumatic.  Right Ear: External ear normal.  Left Ear: External ear normal.  Eyes: Conjunctivae and EOM are normal. Pupils are equal, round, and reactive to light.  Neck: Normal range of motion. Neck supple. No thyromegaly present.  Cardiovascular: Normal rate, regular rhythm and intact distal pulses.   No murmur heard. Pulmonary/Chest: Effort normal and breath sounds normal. No respiratory distress. She has no wheezes. She exhibits no tenderness.  Abdominal: Soft. Bowel sounds are normal.  Musculoskeletal: She exhibits tenderness.       Right shoulder: She exhibits tenderness, pain and decreased strength. She exhibits no crepitus, no deformity and normal pulse.       Right wrist: She exhibits tenderness and bony tenderness.       Left wrist: She exhibits tenderness and bony tenderness.       Right knee: She exhibits decreased range of motion and bony tenderness. She exhibits no LCL laxity.       Left knee: She exhibits decreased range of motion and bony tenderness.  Lymphadenopathy:    She has no cervical adenopathy.  Neurological: She is alert and oriented to person, place, and time. She has normal reflexes.  Skin: Skin is warm and dry. No rash noted. She is not diaphoretic. No erythema. No pallor.  Psychiatric: She has a normal mood and affect. Her behavior is normal. Judgment and thought content normal.   Nursing note and vitals reviewed.         Assessment & Plan:   1. Essential hypertension   2. Hypercholesteremia   3. Family history of diabetes mellitus   4. Other fatigue   5. Hiatal hernia   6. Gastroesophageal reflux disease, esophagitis presence not specified   7. Insomnia, unspecified type   8. Rheumatoid arthritis involving multiple sites, unspecified rheumatoid factor presence (HCC)     HTN (hypertension) Continue Lisinopril, HCTZ daily. Reduce sodium/caffeine intake (drinks > 4 cans of coke daily). Increase water intake, suggesting to flavor water to help improve the test.  Hiatal hernia Transferring care to [facility].   Continue to avoid  acidic/spicy foods.  Continue daily Omeprazole.  GERD (gastroesophageal reflux disease) Transferring care to [facility].   Continue to avoid acidic/spicy foods.  Continue daily Omeprazole.  Rheumatoid arthritis ([place]) Continue with Rheumatologist as directed. Recommend home ROM and stretching.   Hypercholesteremia Continue to reduce saturated fats in diet. Please schedule fasting lab appt with nurse as earliest convenience. Previously on Atorvastatin 10mg -not currently taking.  Family history of diabetes mellitus Reduce saturated fat/sugar/CHO intake. Increase regular exercise.   Other fatigue Increase water intake.  Eat healthy, well-balanced meals. Increase regular movement.  Insomnia If insomnia continues even after lifestyle modifications (reduction of caffeine, good sleep hygiene) and trial on new medication from Rheumatologist-then please call clinic in 2 weeks.    FOLLOW-UP:  Return in about 1 year (around 06/23/2017) for Regular Follow Up.

## 2016-06-23 NOTE — Assessment & Plan Note (Signed)
Continue Lisinopril, HCTZ daily. Reduce sodium/caffeine intake (drinks > 4 cans of coke daily). Increase water intake, suggesting to flavor water to help improve the test.

## 2016-06-23 NOTE — Assessment & Plan Note (Addendum)
Transferring care to [facility].   Continue to avoid acidic/spicy foods.  Continue daily Omeprazole.

## 2016-06-24 ENCOUNTER — Other Ambulatory Visit: Payer: Self-pay | Admitting: Adult Health

## 2016-06-24 DIAGNOSIS — R5383 Other fatigue: Secondary | ICD-10-CM

## 2016-06-24 LAB — COMPREHENSIVE METABOLIC PANEL
ALBUMIN: 4.3 g/dL (ref 3.5–5.5)
ALK PHOS: 94 IU/L (ref 39–117)
ALT: 25 IU/L (ref 0–32)
AST: 19 IU/L (ref 0–40)
Albumin/Globulin Ratio: 1.5 (ref 1.2–2.2)
BUN / CREAT RATIO: 21 (ref 9–23)
BUN: 20 mg/dL (ref 6–24)
Bilirubin Total: 0.3 mg/dL (ref 0.0–1.2)
CO2: 26 mmol/L (ref 18–29)
CREATININE: 0.96 mg/dL (ref 0.57–1.00)
Calcium: 10.2 mg/dL (ref 8.7–10.2)
Chloride: 99 mmol/L (ref 96–106)
GFR calc non Af Amer: 67 mL/min/{1.73_m2} (ref >59)
GFR, EST AFRICAN AMERICAN: 78 mL/min/{1.73_m2} (ref >59)
GLUCOSE: 117 mg/dL — AB (ref 65–99)
Globulin, Total: 2.8 g/dL (ref 1.5–4.5)
Potassium: 5 mmol/L (ref 3.5–5.2)
Sodium: 142 mmol/L (ref 134–144)
TOTAL PROTEIN: 7.1 g/dL (ref 6.0–8.5)

## 2016-06-24 LAB — CBC WITH DIFFERENTIAL/PLATELET
Basophils Absolute: 0 10*3/uL (ref 0.0–0.2)
Basos: 0 %
EOS (ABSOLUTE): 0.1 10*3/uL (ref 0.0–0.4)
Eos: 1 %
Hematocrit: 38.1 % (ref 34.0–46.6)
Hemoglobin: 12.4 g/dL (ref 11.1–15.9)
IMMATURE GRANS (ABS): 0 10*3/uL (ref 0.0–0.1)
Immature Granulocytes: 0 %
LYMPHS: 31 %
Lymphocytes Absolute: 3.8 10*3/uL — ABNORMAL HIGH (ref 0.7–3.1)
MCH: 28.9 pg (ref 26.6–33.0)
MCHC: 32.5 g/dL (ref 31.5–35.7)
MCV: 89 fL (ref 79–97)
MONOCYTES: 8 %
Monocytes Absolute: 1 10*3/uL — ABNORMAL HIGH (ref 0.1–0.9)
Neutrophils Absolute: 7.4 10*3/uL — ABNORMAL HIGH (ref 1.4–7.0)
Neutrophils: 60 %
Platelets: 379 10*3/uL (ref 150–379)
RBC: 4.29 x10E6/uL (ref 3.77–5.28)
RDW: 15.1 % (ref 12.3–15.4)
WBC: 12.4 10*3/uL — AB (ref 3.4–10.8)

## 2016-06-24 LAB — TSH: TSH: 1.8 u[IU]/mL (ref 0.450–4.500)

## 2016-06-24 LAB — HEMOGLOBIN A1C
Est. average glucose Bld gHb Est-mCnc: 123 mg/dL
HEMOGLOBIN A1C: 5.9 % — AB (ref 4.8–5.6)

## 2016-06-24 LAB — VITAMIN D 25 HYDROXY (VIT D DEFICIENCY, FRACTURES): VIT D 25 HYDROXY: 28.3 ng/mL — AB (ref 30.0–100.0)

## 2016-06-24 LAB — LIPID PANEL
CHOLESTEROL TOTAL: 241 mg/dL — AB (ref 100–199)
Chol/HDL Ratio: 4.2 ratio units (ref 0.0–4.4)
HDL: 58 mg/dL (ref >39)
LDL Calculated: 148 mg/dL — ABNORMAL HIGH (ref 0–99)
Triglycerides: 173 mg/dL — ABNORMAL HIGH (ref 0–149)
VLDL CHOLESTEROL CAL: 35 mg/dL (ref 5–40)

## 2016-06-24 MED ORDER — VITAMIN D (ERGOCALCIFEROL) 1.25 MG (50000 UNIT) PO CAPS: 50000.0000 [IU] | ORAL_CAPSULE | ORAL | 0 refills | Status: DC

## 2016-06-24 NOTE — Progress Notes (Unsigned)
[name]

## 2016-07-01 ENCOUNTER — Telehealth: Payer: Self-pay | Admitting: Adult Health

## 2016-07-01 ENCOUNTER — Ambulatory Visit (INDEPENDENT_AMBULATORY_CARE_PROVIDER_SITE_OTHER): Payer: BLUE CROSS/BLUE SHIELD | Admitting: Orthopaedic Surgery

## 2016-07-01 NOTE — Telephone Encounter (Signed)
Called but was unable to reach patient, no voicemail.

## 2016-07-01 NOTE — Telephone Encounter (Signed)
Pt called states she is unable to get in to My Chart to view lab results--request nurse give her at call at [phone] w/ results-- Thanks,  glh

## 2016-07-02 ENCOUNTER — Encounter: Payer: Self-pay | Admitting: Adult Health

## 2016-07-02 ENCOUNTER — Ambulatory Visit (INDEPENDENT_AMBULATORY_CARE_PROVIDER_SITE_OTHER): Payer: BLUE CROSS/BLUE SHIELD | Admitting: Adult Health

## 2016-07-02 VITALS — BP 105/71 | HR 82 | Resp 82 | Wt 214.0 lb

## 2016-07-02 DIAGNOSIS — G4701 Insomnia due to medical condition: Secondary | ICD-10-CM

## 2016-07-02 DIAGNOSIS — R5383 Other fatigue: Secondary | ICD-10-CM | POA: Diagnosis not present

## 2016-07-02 DIAGNOSIS — M069 Rheumatoid arthritis, unspecified: Secondary | ICD-10-CM | POA: Diagnosis not present

## 2016-07-02 MED ORDER — DULOXETINE HCL 60 MG PO CPEP: 60.0000 mg | ORAL_CAPSULE | Freq: Every day | ORAL | 3 refills | Status: DC

## 2016-07-02 NOTE — Progress Notes (Signed)
Subjective:    [name], female    DOB: [birth year], 56 y.o.   MRN: [id number]  HPI:  [name] presents with uncontrolled RA pain in hands/hips/knees.  She recently visited Rheumatologist and was started on Tramadol and Cyclobenzaprine.  She reports that the Tramadol "made me itch all night and didn't help the pain much neither".  She reports pain is constant and fluctuates 8/10-10/10.  The pain and itching has bee "keeping me awake most of the night". She remains quite active walking her 5 dogs and working FT at a [employer].  She denies hx of depression/suicidal ideation.     Brandi Care Team    Relationship Specialty Notifications Start End  Odella Aquas, NP PCP - General Family Medicine  06/23/16   Daneil Dolin, MD Consulting Physician Gastroenterology  03/08/14   Leandrew Koyanagi, MD Attending Physician Orthopedic Surgery  06/23/16   Hurley Cisco, MD Consulting Physician Rheumatology  06/23/16     Brandi Active Problem List   Diagnosis Date Noted  . Family history of diabetes mellitus 06/23/2016  . Other fatigue 06/23/2016  . Insomnia 06/23/2016  . Rheumatoid arthritis ([place]) 06/23/2016  . Nontraumatic incomplete tear of right rotator cuff 04/28/2016  . Impingement syndrome of right shoulder 03/31/2016  . Status post right partial knee replacement 09/15/2014  . Acute medial meniscal injury of knee 06/14/2014  . Status post total knee replacement using cement 06/08/2014  . Primary osteoarthritis of right knee 06/08/2014  . Screening for colon cancer   . Schatzki's ring   . Hiatal hernia   . Dysphagia, pharyngoesophageal phase 05/26/2014  . Encounter for screening colonoscopy 05/26/2014  . Adrenal adenoma 05/01/2014  . Breast lump 05/01/2014  . Metatarsalgia of both feet 01/12/2014  . Bilateral leg pain 01/11/2014  . Bilateral edema of lower extremity 01/11/2014  . Other osteoarthritis of spine, thoracolumbar region 12/26/2013  . Hypercholesteremia  05/19/2013  . Periodic health assessment, general screening, adult 04/13/2013  . GERD (gastroesophageal reflux disease) 04/13/2013  . HTN (hypertension) 04/13/2013  . Dyspnea 04/13/2013     Past Medical History:  Diagnosis Date  . Acid reflux    takes Zantac and Omeprazole daily  . Anxiety    takes Citaopram daily  . Arthritis    right knee  . Chronic back pain    DDD  . Clotting disorder ([place])   . History of blood clots    36yrs ago and in left leg  . History of bronchitis 3+yrs ago  . History of migraine    has been a while since last one  . Hyperlipidemia    takes Atorvastatin daily  . Hypertension    takes Lisinopril and HCTZ daily  . Insomnia    takes Elavil nightly as needed  . Joint pain   . Joint swelling   . Paraesophageal hernia   . Weakness    numbness and tingling in both feet     Past Surgical History:  Procedure Laterality Date  . CHONDROPLASTY Right 06/28/2014   Procedure: CHONDROPLASTY;  Surgeon: Marianna Payment, MD;  Location: [facility];  Service: Orthopedics;  Laterality: Right;  . COLONOSCOPY N/A 05/29/2014   Procedure: COLONOSCOPY;  Surgeon: Daneil Dolin, MD;  [facility];  Service: Endoscopy;  Laterality: N/A;  215pm- Pt is working until 12:00 so she can't come any earlier  . ESOPHAGOGASTRODUODENOSCOPY N/A 05/29/2014   Procedure: ESOPHAGOGASTRODUODENOSCOPY (EGD);  Surgeon: Cristopher Estimable  Rourk, MD;  [facility];  Service: Endoscopy;  Laterality: N/A;  . GANGLION CYST EXCISION Left 01/13/2002  . KNEE ARTHROSCOPY WITH MEDIAL MENISECTOMY Right 06/28/2014   Procedure: RIGHT KNEE ARTHROSCOPY WITH PARTIAL MEDIAL MENISCECTOMY AND CHONDROPLASTY;  Surgeon: Marianna Payment, MD;  Location: [place];  Service: Orthopedics;  Laterality: Right;  . MALONEY DILATION N/A 05/29/2014   Procedure: Venia Minks DILATION;  Surgeon: Daneil Dolin, MD;  [facility];  Service: Endoscopy;  Laterality: N/A;  .  PARTIAL KNEE ARTHROPLASTY Right 09/15/2014   Procedure: RIGHT UNICOMPARTMENTAL KNEE ARTHROPLASTY;  Surgeon: Leandrew Koyanagi, MD;  Location: [place];  Service: Orthopedics;  Laterality: Right;  . TOTAL KNEE ARTHROPLASTY Left 04/08/2004     Family History  Problem Relation Age of Onset  . Cancer Father     lung  . Breast cancer Sister   . Cancer Sister     breast  . Cancer Maternal Grandfather     lung  . Cancer Paternal Grandfather     lung  . Diabetes Son   . Colon cancer Neg Hx      History  Drug Use No     History  Alcohol Use No     History  Smoking Status  . Never Smoker  Smokeless Tobacco  . Never Used     Outpatient Encounter Prescriptions as of 07/02/2016  Medication Sig  . hydrochlorothiazide (HYDRODIURIL) 50 MG tablet Take 1 tablet (50 mg total) by mouth daily.  Marland Kitchen lisinopril (PRINIVIL,ZESTRIL) 40 MG tablet Take 1 tablet (40 mg total) by mouth daily.  . methocarbamol (ROBAXIN) 500 MG tablet Take 1 tablet (500 mg total) by mouth every 6 (six) hours as needed for muscle spasms.  Marland Kitchen omeprazole (PRILOSEC) 20 MG capsule Take 1 capsule (20 mg total) by mouth 2 (two) times daily before a meal.  . Vitamin D, Ergocalciferol, (DRISDOL) 50000 units CAPS capsule Take 1 capsule (50,000 Units total) by mouth every 7 (seven) days.  . DULoxetine (CYMBALTA) 60 MG capsule Take 1 capsule (60 mg total) by mouth daily. Half tablet by mouth daily for first week, then full tablet by mouth daily.   No facility-administered encounter medications on file as of 07/02/2016.     Allergies: Tramadol  Body mass index is 36.73 kg/m.  Blood pressure 105/71, pulse 82, resp. rate (!) 82, weight 214 lb (97.1 kg), last menstrual period 02/07/2013, SpO2 99 %.     Review of Systems  Constitutional: Positive for activity change and fatigue. Negative for appetite change, chills, diaphoresis, fever and unexpected weight change.  Respiratory: Negative for cough and shortness of breath.    Cardiovascular: Negative for chest pain, palpitations and leg swelling.  Musculoskeletal: Positive for arthralgias, back pain, gait problem, joint swelling and myalgias.  Skin: Negative for color change, pallor, rash and wound.  Psychiatric/Behavioral: Positive for sleep disturbance. Negative for agitation, behavioral problems, hallucinations, self-injury and suicidal ideas. The Brandi is not nervous/anxious and is not hyperactive.        Objective:   Physical Exam  Constitutional: She is oriented to person, place, and time. She appears well-developed and well-nourished. No distress.  Musculoskeletal: She exhibits edema and tenderness.       Right knee: She exhibits decreased range of motion and swelling.       Left knee: She exhibits decreased range of motion and swelling.       Thoracic back: She exhibits tenderness.       Lumbar  back: She exhibits decreased range of motion and tenderness.  Uses furniture to stand from seated position.  Slow, steady gait, however she appears uncomfortable with position changes and when first few steps of  walking.  Neurological: She is alert and oriented to person, place, and time. She has normal reflexes.  Skin: Skin is warm and dry. No rash noted. She is not diaphoretic. No erythema. No pallor.  Psychiatric: She has a normal mood and affect. Her behavior is normal. Judgment and thought content normal.  Nursing note and vitals reviewed.         Assessment & Plan:   1. Insomnia due to medical condition   2. Other fatigue   3. Rheumatoid arthritis involving multiple sites, unspecified rheumatoid factor presence (HCC)     Rheumatoid arthritis (HCC) Stop Tramadol. Start 1/2 tablet of Duloxetine 60mg  daily for first week, then full tablet. Daily stretching. Warm Epson Salt baths to help with aches/soreness.  Insomnia Stop Tramadol.    FOLLOW-UP:  Return if symptoms worsen or fail to improve.

## 2016-07-02 NOTE — Assessment & Plan Note (Signed)
Stop Tramadol. Start 1/2 tablet of Duloxetine 60mg  daily for first week, then full tablet. Daily stretching. Warm Epson Salt baths to help with aches/soreness.

## 2016-07-02 NOTE — Telephone Encounter (Signed)
Morning Angela, Please have her schedule an acute appt. To address this matter. Thanks! Valetta Fuller

## 2016-07-02 NOTE — Patient Instructions (Addendum)
Rheumatoid Arthritis Introduction Rheumatoid arthritis (RA) is a long-term (chronic) disease. RA causes inflammation in your joints. Your joints may feel painful, stiff, swollen, warm, or tender. RA may start slowly. Usually, it affects the small joints of the hands and feet. It can also affect other parts of the body, even the heart, eyes, or lungs. Symptoms of RA often come and go. Sometimes, symptoms get worse for a while. These are called flares. There is no cure for RA, but your doctor will work with you to find the best treatment option for you. This will depend on how the disease is changing in your body. Follow these instructions at home:  Take over-the-counter and prescription medicines only as told by your doctor. Your doctor may change (adjust) your medicines every 3 months.  Start an exercise program as told by your doctor.  Rest when you have a flare.  Return to your normal activities as told by your doctor. Ask your doctor what activities are safe for you.  Keep all follow-up visits as told by your doctor. This is important. Contact a doctor if:  You have a flare.  You have a fever.  You have problems (side effects) because of your medicines. Get help right away if:  You have chest pain.  You have trouble breathing.  You have a hot, painful joint all of a sudden, and it is worse than your usual joint aches. This information is not intended to replace advice given to you by your health care provider. Make sure you discuss any questions you have with your health care provider. Document Released: 07/21/2011 Document Revised: 10/04/2015 Document Reviewed: 02/08/2015  2017 Elsevier  Stop taking Tramadol. Start Cymbalta-1/2 tablet by mouth for the first week, then full tablet by mouth. Stretch daily.  Recommend taking in the morning. Please call clinic with any questions/concerns.

## 2016-07-02 NOTE — Telephone Encounter (Signed)
[name] did see Dr Charlestine Night, Rheumatologist, for her arthritis pain. She states the pain is so bad she is unable to sleep. She has tried flexeril without relief and now tried Tramadol which caused a feeling of bugs crawling all over her. She states she was advised to call back if she didn't get relief. Please advise.     Lindaann Slough MD Rheumatologist in [place], [place] Address: [address], [place], [place] [address] Phone: [phone]

## 2016-07-02 NOTE — Telephone Encounter (Signed)
Patient scheduled.

## 2016-07-02 NOTE — Telephone Encounter (Signed)
Left message for a return call. Left on mobile number. Number in message doesn't allow messages.

## 2016-07-02 NOTE — Assessment & Plan Note (Signed)
Stop Tramadol.

## 2016-07-03 ENCOUNTER — Telehealth: Payer: Self-pay

## 2016-07-03 ENCOUNTER — Ambulatory Visit (HOSPITAL_COMMUNITY): Payer: BLUE CROSS/BLUE SHIELD

## 2016-07-03 NOTE — Telephone Encounter (Signed)
Pharmacy called stating that duloxetine capsules cannot be cut in half as RX stated.  Per Lillard Anes, NP-C, RX called in for duloxetine 30mg  #7 take one capsule daily x 7 days.  Charyl Bigger, CMA

## 2016-07-07 ENCOUNTER — Ambulatory Visit (INDEPENDENT_AMBULATORY_CARE_PROVIDER_SITE_OTHER): Payer: BLUE CROSS/BLUE SHIELD | Admitting: Orthopaedic Surgery

## 2016-07-08 ENCOUNTER — Other Ambulatory Visit (INDEPENDENT_AMBULATORY_CARE_PROVIDER_SITE_OTHER): Payer: BLUE CROSS/BLUE SHIELD

## 2016-07-08 DIAGNOSIS — E7800 Pure hypercholesterolemia, unspecified: Secondary | ICD-10-CM

## 2016-07-09 ENCOUNTER — Other Ambulatory Visit: Payer: Self-pay | Admitting: Adult Health

## 2016-07-09 DIAGNOSIS — E7800 Pure hypercholesterolemia, unspecified: Secondary | ICD-10-CM

## 2016-07-09 LAB — LIPID PANEL
Chol/HDL Ratio: 4.7 ratio units — ABNORMAL HIGH (ref 0.0–4.4)
Cholesterol, Total: 247 mg/dL — ABNORMAL HIGH (ref 100–199)
HDL: 53 mg/dL (ref >39)
LDL Calculated: 171 mg/dL — ABNORMAL HIGH (ref 0–99)
TRIGLYCERIDES: 115 mg/dL (ref 0–149)
VLDL Cholesterol Cal: 23 mg/dL (ref 5–40)

## 2016-07-09 MED ORDER — ATORVASTATIN CALCIUM 10 MG PO TABS: 10.0000 mg | ORAL_TABLET | Freq: Every day | ORAL | 3 refills | Status: DC

## 2016-07-09 NOTE — Progress Notes (Unsigned)
atotr

## 2016-07-14 ENCOUNTER — Ambulatory Visit (INDEPENDENT_AMBULATORY_CARE_PROVIDER_SITE_OTHER): Payer: BLUE CROSS/BLUE SHIELD | Admitting: Orthopaedic Surgery

## 2016-07-14 ENCOUNTER — Telehealth (INDEPENDENT_AMBULATORY_CARE_PROVIDER_SITE_OTHER): Payer: Self-pay | Admitting: Orthopaedic Surgery

## 2016-07-14 ENCOUNTER — Other Ambulatory Visit: Payer: Self-pay

## 2016-07-14 ENCOUNTER — Other Ambulatory Visit: Payer: Self-pay | Admitting: Family Medicine

## 2016-07-14 DIAGNOSIS — K219 Gastro-esophageal reflux disease without esophagitis: Secondary | ICD-10-CM

## 2016-07-14 MED ORDER — OMEPRAZOLE 20 MG PO CPDR: 20.0000 mg | DELAYED_RELEASE_CAPSULE | Freq: Two times a day (BID) | ORAL | 0 refills | Status: DC

## 2016-07-14 NOTE — Telephone Encounter (Signed)
ok 

## 2016-07-14 NOTE — Telephone Encounter (Signed)
FYI

## 2016-07-14 NOTE — Telephone Encounter (Signed)
Patient called to canc appt today because of transportation issues.  She states that she will call later to reschedule, but for now she is better and the muscle relaxers are working.

## 2016-08-07 ENCOUNTER — Telehealth: Payer: Self-pay

## 2016-08-11 ENCOUNTER — Telehealth: Payer: Self-pay | Admitting: Gastroenterology

## 2016-08-11 NOTE — Telephone Encounter (Signed)
Referral was sent to our office for patient to be seen about her hiatal hernia. Patient was last seen at [facility] 09/2015. Patient states that [facility] only wants to do procedures on her and will not "listen" to her. Patient states that it has been over a year since seeing [facility]. Reports faxed to our office and she is not requesting a specific doctor. DOD for records received 08/07/16 is Dr.Armbruster. Placed on desk fro review.

## 2016-08-12 ENCOUNTER — Encounter: Payer: Self-pay | Admitting: Gastroenterology

## 2016-08-12 NOTE — Telephone Encounter (Signed)
Dr.Armbruster reviewed records and accepted patient okay to schedule an office visit. Patient has been scheduled for soonest available office visit 09/17/16.

## 2016-08-12 NOTE — Telephone Encounter (Signed)
Called and left message for patient to schedule. Letting her know Dr. Havery Moros accepted her as a patient.

## 2016-08-13 ENCOUNTER — Other Ambulatory Visit: Payer: Self-pay | Admitting: Family Medicine

## 2016-08-13 DIAGNOSIS — I10 Essential (primary) hypertension: Secondary | ICD-10-CM

## 2016-08-22 ENCOUNTER — Telehealth: Payer: Self-pay | Admitting: Adult Health

## 2016-08-22 NOTE — Telephone Encounter (Signed)
Patient is requesting a refill of the hydrochlorothiazide 50 mg and also takes ibuprofen 800mg  as needed for headaches and is requesting a new Rx for that.

## 2016-08-25 ENCOUNTER — Other Ambulatory Visit: Payer: Self-pay | Admitting: Adult Health

## 2016-08-25 DIAGNOSIS — I10 Essential (primary) hypertension: Secondary | ICD-10-CM

## 2016-08-25 DIAGNOSIS — M79605 Pain in left leg: Principal | ICD-10-CM

## 2016-08-25 DIAGNOSIS — M79604 Pain in right leg: Secondary | ICD-10-CM

## 2016-08-25 MED ORDER — IBUPROFEN 600 MG PO TABS: 600.0000 mg | ORAL_TABLET | Freq: Three times a day (TID) | ORAL | 1 refills | Status: DC | PRN

## 2016-08-25 MED ORDER — HYDROCHLOROTHIAZIDE 50 MG PO TABS: 50.0000 mg | ORAL_TABLET | Freq: Every day | ORAL | 2 refills | Status: DC

## 2016-08-25 NOTE — Telephone Encounter (Signed)
We have not prescribed these medications for the patient previously.  Please review and refill if appropriate.  T. Ginette Bradway, CMA  

## 2016-08-25 NOTE — Telephone Encounter (Signed)
Done. Thanks! [name]

## 2016-08-29 ENCOUNTER — Telehealth: Payer: Self-pay | Admitting: Gastroenterology

## 2016-08-29 NOTE — Telephone Encounter (Signed)
Incoming Records from [facility] 37 Pages

## 2016-09-08 ENCOUNTER — Ambulatory Visit (INDEPENDENT_AMBULATORY_CARE_PROVIDER_SITE_OTHER): Payer: BLUE CROSS/BLUE SHIELD | Admitting: Podiatry

## 2016-09-08 ENCOUNTER — Ambulatory Visit (INDEPENDENT_AMBULATORY_CARE_PROVIDER_SITE_OTHER): Payer: BLUE CROSS/BLUE SHIELD

## 2016-09-08 ENCOUNTER — Encounter: Payer: Self-pay | Admitting: Podiatry

## 2016-09-08 DIAGNOSIS — M2042 Other hammer toe(s) (acquired), left foot: Secondary | ICD-10-CM

## 2016-09-08 DIAGNOSIS — M21611 Bunion of right foot: Secondary | ICD-10-CM

## 2016-09-08 DIAGNOSIS — M79671 Pain in right foot: Secondary | ICD-10-CM | POA: Diagnosis not present

## 2016-09-08 DIAGNOSIS — M2011 Hallux valgus (acquired), right foot: Secondary | ICD-10-CM

## 2016-09-08 DIAGNOSIS — M79672 Pain in left foot: Secondary | ICD-10-CM

## 2016-09-08 DIAGNOSIS — M2041 Other hammer toe(s) (acquired), right foot: Secondary | ICD-10-CM | POA: Diagnosis not present

## 2016-09-08 NOTE — Progress Notes (Signed)
   Subjective:  56 year old female presents to the office today as a new patient for evaluation of pain to the right foot as well as the left, however the right foot is worse than left. Pain is been ongoing for approximately 3 years now. Patient states that the pain is aggravated by walking for long periods of time. Patient states that she was also recently diagnosed with rheumatoid arthritis and is being seen by rheumatologist.   Objective: Physical Exam General: The patient is alert and oriented x3 in no acute distress.  Dermatology: Skin is cool, dry and supple bilateral lower extremities. Negative for open lesions or macerations.  Vascular: Palpable pedal pulses bilaterally. No edema or erythema noted. Capillary refill within normal limits.  Neurological: Epicritic and protective threshold grossly intact bilaterally.   Musculoskeletal Exam: Clinical evidence of bunion deformity noted to the respective foot. There is a moderate pain on palpation range of motion of the first MPJ. Lateral deviation of the hallux noted consistent with hallux abductovalgus. Hammertoe contracture also noted on clinical exam to digits 2 of the bilateral foot. Symptomatic pain on palpation and range of motion also noted to the metatarsal phalangeal joints of the respective hammertoe digits.    Radiographic Exam: Increased intermetatarsal angle greater than 15 with a hallux abductus angle greater than 30 noted on AP view. Moderate degenerative changes noted within the first MPJ. Contracture deformity also noted to the interphalangeal joints and MPJs of the digits of the respective hammertoes.  Assessment: 1. 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 Hammertoe deformity second digit bilateral lower extremity  Plan of Care:  1. Patient was evaluated. X-rays were reviewed today. 2. Today we discussed conservative versus surgical management of the patient's presenting symptoms. Patient opts for surgical  management. All possible complications and details of the procedure were explained. No guarantees were expressed or implied. All patient questions were answered. 3. Authorization for surgery initiated today. Surgery will consist of bunionectomy with metatarsal osteotomy right foot. PIPJ arthroplasty second digit bilateral. Second MPJ capsulotomy bilateral. 4. Return to clinic 1 week postop   Edrick Kins, DPM [facility]  Dr. Edrick Kins, [address]                                        [place], [place] [address]                Office [phone]  Fax [phone]

## 2016-09-15 ENCOUNTER — Telehealth: Payer: Self-pay | Admitting: *Deleted

## 2016-09-15 NOTE — Telephone Encounter (Signed)
"  I want to see if I can get a call back about a surgery I have due on Thursday."

## 2016-09-16 NOTE — Telephone Encounter (Signed)
"  I need to reschedule my surgery to [facility].  The people at that surgical center on [facility] are not willing to work with me.  I don't have $5000 dollars to give them up front.  Is there any way we can do it at [facility] or do we need to just cancel it overall?"  He can do it at [facility] But you will need to get a physical and have a history and physical form filled out by your primary care doctor.  It has to be done within 30 days of the scheduled surgery.  I suggest you call and get an appointment with your primary care doctor then call me and we can schedule a date for the surgery.  "Can you mail me the history and physical form that I will have to have filled out?"  Yes, I'll put it in the mail.  I called Caren Griffins at [facility] and canceled surgery for 09/18/2016.    I put history and physical form in the mail for patient to take to her primary care physician for completion.

## 2016-09-17 ENCOUNTER — Ambulatory Visit: Payer: BLUE CROSS/BLUE SHIELD | Admitting: Gastroenterology

## 2016-09-24 ENCOUNTER — Encounter: Payer: BLUE CROSS/BLUE SHIELD | Admitting: Podiatry

## 2016-10-01 ENCOUNTER — Encounter: Payer: BLUE CROSS/BLUE SHIELD | Admitting: Podiatry

## 2016-10-20 MED FILL — ?DULOXETINE HCL DR 60 MG CA: 60 MG | 30 days supply | Qty: 30 | Fill #0

## 2016-10-22 ENCOUNTER — Ambulatory Visit: Payer: Self-pay | Attending: Internal Medicine

## 2016-11-11 ENCOUNTER — Telehealth: Payer: Self-pay | Admitting: Adult Health

## 2016-11-11 ENCOUNTER — Other Ambulatory Visit: Payer: Self-pay | Admitting: Adult Health

## 2016-11-11 DIAGNOSIS — I10 Essential (primary) hypertension: Secondary | ICD-10-CM

## 2016-11-11 NOTE — Telephone Encounter (Signed)
Patient is requesting a refill of her lisinopril 40mg . Sent to [facility]. Phone number is [phone].

## 2016-11-11 NOTE — Telephone Encounter (Signed)
Please refill Lisinopril 40mg , count 90 with 2 refills. Thanks! [name]

## 2016-11-13 MED ORDER — LISINOPRIL 40 MG PO TABS: 40.0000 mg | ORAL_TABLET | Freq: Every day | ORAL | 2 refills | Status: DC

## 2016-11-13 MED FILL — LISINOPRIL 40 MG TABLET: 40 | 30 days supply | Qty: 30 | Fill #0

## 2016-11-13 NOTE — Telephone Encounter (Signed)
Mediation refilled to [facility]. Please call and advise patient.

## 2016-11-13 NOTE — Addendum Note (Signed)
Addended by: Narda Rutherford on: 11/13/2016 11:38 AM   Modules accepted: Orders

## 2016-11-20 ENCOUNTER — Telehealth: Payer: Self-pay | Admitting: Adult Health

## 2016-11-20 ENCOUNTER — Other Ambulatory Visit: Payer: Self-pay

## 2016-11-20 DIAGNOSIS — K219 Gastro-esophageal reflux disease without esophagitis: Secondary | ICD-10-CM

## 2016-11-20 DIAGNOSIS — I10 Essential (primary) hypertension: Secondary | ICD-10-CM

## 2016-11-20 MED ORDER — OMEPRAZOLE 20 MG PO CPDR: 20.0000 mg | DELAYED_RELEASE_CAPSULE | Freq: Two times a day (BID) | ORAL | 0 refills | Status: DC

## 2016-11-20 MED ORDER — DULOXETINE HCL 60 MG PO CPEP: 60.0000 mg | ORAL_CAPSULE | Freq: Every day | ORAL | 0 refills | Status: DC

## 2016-11-20 MED ORDER — HYDROCHLOROTHIAZIDE 50 MG PO TABS: 50.0000 mg | ORAL_TABLET | Freq: Every day | ORAL | 0 refills | Status: DC

## 2016-11-20 NOTE — Telephone Encounter (Signed)
Patient is requesting refills of her omeprazole, hydrochlorothiazide, and duloxetine sent to [facility].

## 2016-11-20 NOTE — Telephone Encounter (Signed)
Refills sent to pharmacy.  Patient needs appointment for further refills, left message informing patient.

## 2016-12-04 MED FILL — DULoxetine HCL 60 MG CPEP: 60 | 30 days supply | Qty: 30 | Fill #0

## 2016-12-04 MED FILL — ?OMEPRAZOLE DR 20 MG CAPSUL: 20 | 30 days supply | Qty: 60 | Fill #0

## 2016-12-04 MED FILL — HYDROCHLOROTHIAZIDE 25 MG T: 25 | 30 days supply | Qty: 60 | Fill #0

## 2016-12-15 MED FILL — LISINOPRIL 40 MG TABLET: 40 | 30 days supply | Qty: 30 | Fill #1

## 2017-01-02 NOTE — Telephone Encounter (Signed)
This encounter was created in error - please disregard.

## 2017-01-08 ENCOUNTER — Ambulatory Visit (INDEPENDENT_AMBULATORY_CARE_PROVIDER_SITE_OTHER): Payer: Self-pay | Admitting: Adult Health

## 2017-01-08 ENCOUNTER — Encounter: Payer: Self-pay | Admitting: Adult Health

## 2017-01-08 DIAGNOSIS — R5383 Other fatigue: Secondary | ICD-10-CM

## 2017-01-08 DIAGNOSIS — G4701 Insomnia due to medical condition: Secondary | ICD-10-CM

## 2017-01-08 DIAGNOSIS — E7800 Pure hypercholesterolemia, unspecified: Secondary | ICD-10-CM

## 2017-01-08 DIAGNOSIS — K219 Gastro-esophageal reflux disease without esophagitis: Secondary | ICD-10-CM

## 2017-01-08 DIAGNOSIS — I10 Essential (primary) hypertension: Secondary | ICD-10-CM

## 2017-01-08 MED ORDER — OMEPRAZOLE 20 MG PO CPDR: 20.0000 mg | DELAYED_RELEASE_CAPSULE | Freq: Two times a day (BID) | ORAL | 0 refills | Status: DC

## 2017-01-08 MED ORDER — DULOXETINE HCL 60 MG PO CPEP: 60.0000 mg | ORAL_CAPSULE | Freq: Every day | ORAL | 0 refills | Status: DC

## 2017-01-08 MED ORDER — HYDROCHLOROTHIAZIDE 50 MG PO TABS: 50.0000 mg | ORAL_TABLET | Freq: Every day | ORAL | 0 refills | Status: DC

## 2017-01-08 MED FILL — ?OMEPRAZOLE DR 20 MG CAPSUL: 20 | 30 days supply | Qty: 60 | Fill #0

## 2017-01-08 MED FILL — ?DULOXETINE HCL DR 60 MG CA: 60 MG | 30 days supply | Qty: 30 | Fill #0

## 2017-01-08 NOTE — Assessment & Plan Note (Signed)
Continue to follow heart healthy diet

## 2017-01-08 NOTE — Assessment & Plan Note (Signed)
She has increased water, improved diet, and lost weight. She reports reduction in fatigue.

## 2017-01-08 NOTE — Patient Instructions (Signed)

## 2017-01-08 NOTE — Assessment & Plan Note (Signed)
Reports improved quality/quantity of sleep.

## 2017-01-08 NOTE — Progress Notes (Signed)
Subjective:    Patient ID: [name], female    DOB: December 20, 1960, 56 y.o.   MRN: [id number]  HPI:  [name] is here for f/u: HTN, depression, GERD,insomnia, HL, and fatigue. She has been compliant on all medications and denies SE.  She has been steadily increasing water and been consuming less caffeine.  She feels that her sleep has improved and has noticed a slight decline in overall fatigue. She was seen/evaluated by podiatry and a surgical plan was agreed upon. She feels that her mood is stable and denies thoughts of harming herself/others and has really enjoyed her new job at [employer].  She continues to avoid EOTH/tobacco.  Patient Care Team    Relationship Specialty Notifications Start End  [name] D, NP PCP - General Family Medicine  06/23/16   [name] Consulting Physician Gastroenterology  03/08/14   [name] Attending Physician Orthopedic Surgery  06/23/16   [name] Consulting Physician Rheumatology  06/23/16     Patient Active Problem List   Diagnosis Date Noted  . Family history of diabetes mellitus 06/23/2016  . Other fatigue 06/23/2016  . Insomnia 06/23/2016  . Rheumatoid arthritis ([place]) 06/23/2016  . Nontraumatic incomplete tear of right rotator cuff 04/28/2016  . Impingement syndrome of right shoulder 03/31/2016  . Status post right partial knee replacement 09/15/2014  . Acute medial meniscal injury of knee 06/14/2014  . Status post total knee replacement using cement 06/08/2014  . Primary osteoarthritis of right knee 06/08/2014  . Screening for colon cancer   . Schatzki's ring   . Hiatal hernia   . Dysphagia, pharyngoesophageal phase 05/26/2014  . Encounter for screening colonoscopy 05/26/2014  . Adrenal adenoma 05/01/2014  . Breast lump 05/01/2014  . Metatarsalgia of both feet 01/12/2014  . Bilateral leg pain 01/11/2014  . Bilateral edema of lower extremity 01/11/2014  . Other osteoarthritis of spine,  thoracolumbar region 12/26/2013  . Hypercholesteremia 05/19/2013  . Periodic health assessment, general screening, adult 04/13/2013  . GERD (gastroesophageal reflux disease) 04/13/2013  . HTN (hypertension) 04/13/2013  . Dyspnea 04/13/2013     Past Medical History:  Diagnosis Date  . Acid reflux    takes Zantac and Omeprazole daily  . Anxiety    takes Citaopram daily  . Arthritis    right knee  . Chronic back pain    DDD  . Clotting disorder ([place])   . History of blood clots    66yrs ago and in left leg  . History of bronchitis 3+yrs ago  . History of migraine    has been a while since last one  . Hyperlipidemia    takes Atorvastatin daily  . Hypertension    takes Lisinopril and HCTZ daily  . Insomnia    takes Elavil nightly as needed  . Joint pain   . Joint swelling   . Paraesophageal hernia   . Weakness    numbness and tingling in both feet     Past Surgical History:  Procedure Laterality Date  . CHONDROPLASTY Right 06/28/2014   Procedure: CHONDROPLASTY;  Surgeon: Marianna Payment, Baker;  Location: [facility];  Service: Orthopedics;  Laterality: Right;  . COLONOSCOPY N/A 05/29/2014   Procedure: COLONOSCOPY;  Surgeon: [name];  [facility];  Service: Endoscopy;  Laterality: N/A;  215pm- Pt is working until 12:00 so she can't come any earlier  . ESOPHAGOGASTRODUODENOSCOPY N/A 05/29/2014   Procedure: ESOPHAGOGASTRODUODENOSCOPY (EGD);  Surgeon: [name];  [facility];  Service: Endoscopy;  Laterality: N/A;  . GANGLION CYST EXCISION Left 01/13/2002  . KNEE ARTHROSCOPY WITH MEDIAL MENISECTOMY Right 06/28/2014   Procedure: RIGHT KNEE ARTHROSCOPY WITH PARTIAL MEDIAL MENISCECTOMY AND CHONDROPLASTY;  Surgeon: Marianna Payment, Baker;  Location: [facility];  Service: Orthopedics;  Laterality: Right;  . MALONEY DILATION N/A 05/29/2014   Procedure: Venia Minks DILATION;  Surgeon: [name];  [facility];  Service: Endoscopy;  Laterality: N/A;  . PARTIAL KNEE ARTHROPLASTY Right 09/15/2014   Procedure: RIGHT UNICOMPARTMENTAL KNEE ARTHROPLASTY;  Surgeon: [name];  Location: [facility];  Service: Orthopedics;  Laterality: Right;  . TOTAL KNEE ARTHROPLASTY Left 04/08/2004     Family History  Problem Relation Age of Onset  . Cancer Father        lung  . Breast cancer Sister   . Cancer Sister        breast  . Cancer Maternal Grandfather        lung  . Cancer Paternal Grandfather        lung  . Diabetes Son   . Colon cancer Neg Hx      History  Drug Use No     History  Alcohol Use No     History  Smoking Status  . Never Smoker  Smokeless Tobacco  . Never Used     Outpatient Encounter Prescriptions as of 01/08/2017  Medication Sig  . atorvastatin (LIPITOR) 10 MG tablet Take 1 tablet (10 mg total) by mouth daily.  . DULoxetine (CYMBALTA) 60 MG capsule Take 1 capsule (60 mg total) by mouth daily.  . hydrochlorothiazide (HYDRODIURIL) 50 MG tablet Take 1 tablet (50 mg total) by mouth daily.  Marland Kitchen ibuprofen (ADVIL,MOTRIN) 600 MG tablet Take 1 tablet (600 mg total) by mouth every 8 (eight) hours as needed.  Marland Kitchen lisinopril (PRINIVIL,ZESTRIL) 40 MG tablet Take 1 tablet (40 mg total) by mouth daily.  . methocarbamol (ROBAXIN) 500 MG tablet Take 1 tablet (500 mg total) by mouth every 6 (six) hours as needed for muscle spasms.  Marland Kitchen omeprazole (PRILOSEC) 20 MG capsule Take 1 capsule (20 mg total) by mouth 2 (two) times daily before a meal.  . [DISCONTINUED] DULoxetine (CYMBALTA) 60 MG capsule Take 1 capsule (60 mg total) by mouth daily.  . [DISCONTINUED] hydrochlorothiazide (HYDRODIURIL) 50 MG tablet Take 1 tablet (50 mg total) by mouth daily.  . [DISCONTINUED] omeprazole (PRILOSEC) 20 MG capsule Take 1 capsule (20 mg total) by mouth 2 (two) times daily before a meal.  . [DISCONTINUED] Vitamin Baker, Ergocalciferol, (DRISDOL) 50000 units CAPS capsule Take 1 capsule (50,000 Units  total) by mouth every 7 (seven) days.   No facility-administered encounter medications on file as of 01/08/2017.     Allergies: Tramadol  Body mass index is 35.7 kg/m.  Blood pressure 111/77, pulse 80, height 5\' 4"  (1.626 m), weight 208 lb (94.3 kg), last menstrual period 02/07/2013.     Review of Systems  Constitutional: Negative for activity change, appetite change, chills, diaphoresis, fatigue, fever and unexpected weight change.  HENT: Negative for congestion.   Eyes: Negative for visual disturbance.  Respiratory: Negative for cough, chest tightness, shortness of breath, wheezing and stridor.   Cardiovascular: Negative for chest pain, palpitations and leg swelling.  Gastrointestinal: Negative for abdominal distention, abdominal pain, blood in stool, constipation, diarrhea, nausea and vomiting.  Endocrine: Negative for cold intolerance, heat intolerance, polydipsia, polyphagia and  polyuria.  Genitourinary: Negative for difficulty urinating and flank pain.  Musculoskeletal: Negative for arthralgias, back pain, gait problem, joint swelling, myalgias, neck pain and neck stiffness.  Skin: Negative for color change, pallor, rash and wound.  Neurological: Negative for dizziness and headaches.  Hematological: Does not bruise/bleed easily.  Psychiatric/Behavioral: Positive for sleep disturbance.       Objective:   Physical Exam  Constitutional: She is oriented to person, place, and time. She appears well-developed and well-nourished. No distress.  HENT:  Head: Normocephalic and atraumatic.  Right Ear: External ear normal.  Left Ear: External ear normal.  Eyes: Pupils are equal, round, and reactive to light. Conjunctivae are normal.  Neck: Normal range of motion. Neck supple.  Cardiovascular: Normal rate, regular rhythm, normal heart sounds and intact distal pulses.   No murmur heard. Pulmonary/Chest: Effort normal and breath sounds normal. No respiratory distress. She has no  wheezes. She has no rales. She exhibits no tenderness.  Lymphadenopathy:    She has no cervical adenopathy.  Neurological: She is alert and oriented to person, place, and time.  Skin: Skin is warm and dry. No rash noted. She is not diaphoretic. No erythema. No pallor.  Psychiatric: She has a normal mood and affect. Her behavior is normal. Judgment and thought content normal.  Nursing note and vitals reviewed.         Assessment & Plan:   1. Essential hypertension   2. Gastroesophageal reflux disease without esophagitis   3. Hypercholesteremia   4. Insomnia due to medical condition   5. Other fatigue     HTN (hypertension) BP at goal 111/77, HR 80 Continue Lisinopril 40mg  and HCTZ 50mg  She denies acute cardiac sx's.  Hypercholesteremia Continue to follow heart healthy diet  Insomnia Reports improved quality/quantity of sleep.  Other fatigue She has increased water, improved diet, and lost weight. She reports reduction in fatigue.    FOLLOW-UP:  Return in about 6 months (around 07/09/2017) for CPE.

## 2017-01-08 NOTE — Assessment & Plan Note (Signed)
BP at goal 111/77, HR 80 Continue Lisinopril 40mg  and HCTZ 50mg  She denies acute cardiac sx's.

## 2017-01-14 MED FILL — LISINOPRIL 40 MG TABLET: 40 | 30 days supply | Qty: 30 | Fill #2

## 2017-02-10 ENCOUNTER — Other Ambulatory Visit: Payer: Self-pay | Admitting: Adult Health

## 2017-02-10 DIAGNOSIS — I10 Essential (primary) hypertension: Secondary | ICD-10-CM

## 2017-02-10 MED ORDER — DULOXETINE HCL 60 MG PO CPEP: 60.0000 mg | ORAL_CAPSULE | Freq: Every day | ORAL | 0 refills | Status: DC

## 2017-02-10 MED ORDER — HYDROCHLOROTHIAZIDE 50 MG PO TABS: 50.0000 mg | ORAL_TABLET | Freq: Every day | ORAL | 0 refills | Status: DC

## 2017-02-10 NOTE — Telephone Encounter (Signed)
Pt came by office fr Rx refill request Duloxetine/Cymbalta HCL Dr 60 MG goes to  [facility]  -- Hydrochlorothiazide 50  MG / Hydrodiuril 50 MG goes to  [facility] on  [address] Dion Body

## 2017-02-23 ENCOUNTER — Telehealth: Payer: Self-pay | Admitting: Adult Health

## 2017-02-23 DIAGNOSIS — I10 Essential (primary) hypertension: Secondary | ICD-10-CM

## 2017-02-23 NOTE — Telephone Encounter (Signed)
[name], this is [name]'s patient.  She still has a refill on medication.  I am not sure if we can do a small amount for her please advise. Thanks MPulliam, CMA/RT(R)

## 2017-02-23 NOTE — Telephone Encounter (Signed)
Patient is requesting a small supply of her lisinopril sent to [facility], she has been out for a week and cant make it to her [place] because of car troubles. She has been out for a week now and is starting to feel the effects of not taking it. She said she should be able to get her normal refill next week but wants short supply sent to [facility].

## 2017-02-24 MED ORDER — LISINOPRIL 40 MG PO TABS: 40.0000 mg | ORAL_TABLET | Freq: Every day | ORAL | 0 refills | Status: DC

## 2017-02-24 MED FILL — ?DULOXETINE HCL DR 60 MG CA: 60 MG | 30 days supply | Qty: 30 | Fill #0

## 2017-02-24 NOTE — Addendum Note (Signed)
Addended by: Fonnie Mu on: 02/24/2017 10:25 AM   Modules accepted: Orders

## 2017-02-25 MED FILL — LISINOPRIL 40 MG TABLET: 40 | 30 days supply | Qty: 30 | Fill #3

## 2017-03-13 MED FILL — ?ATORVASTATIN 10 MG TABLET: 10 | 30 days supply | Qty: 30 | Fill #0

## 2017-03-26 MED FILL — ?DULOXETINE HCL DR 60 MG CA: 60 MG | 30 days supply | Qty: 30 | Fill #1

## 2017-03-26 MED FILL — LISINOPRIL 40 MG TAB: 40 | 30 days supply | Qty: 30 | Fill #4

## 2017-04-10 ENCOUNTER — Other Ambulatory Visit: Payer: Self-pay | Admitting: Adult Health

## 2017-04-15 MED FILL — ?ATORVASTATIN 10 MG TABLET: 10 | 30 days supply | Qty: 30 | Fill #1

## 2017-04-22 MED FILL — ?DULOXETINE HCL DR 60 MG CA: 60 MG | 30 days supply | Qty: 30 | Fill #2

## 2017-04-22 MED FILL — LISINOPRIL 40 MG TAB: 40 | 30 days supply | Qty: 30 | Fill #5

## 2017-04-24 ENCOUNTER — Other Ambulatory Visit: Payer: Self-pay | Admitting: Obstetrics and Gynecology

## 2017-04-24 DIAGNOSIS — Z1231 Encounter for screening mammogram for malignant neoplasm of breast: Secondary | ICD-10-CM

## 2017-05-14 ENCOUNTER — Telehealth: Payer: Self-pay | Admitting: Adult Health

## 2017-05-14 DIAGNOSIS — I10 Essential (primary) hypertension: Secondary | ICD-10-CM

## 2017-05-14 MED ORDER — HYDROCHLOROTHIAZIDE 50 MG PO TABS: 50.0000 mg | ORAL_TABLET | Freq: Every day | ORAL | 0 refills | Status: DC

## 2017-05-14 NOTE — Addendum Note (Signed)
Addended by: Fonnie Mu on: 05/14/2017 11:16 AM   Modules accepted: Orders

## 2017-05-14 NOTE — Telephone Encounter (Signed)
Pt informed RX sent to pharmacy and to be sure and keep CPE appt 06/2017.  Charyl Bigger, CMA

## 2017-05-14 NOTE — Telephone Encounter (Signed)
Patient is requesting a refill of her hydrochlorothiazide, if approved please send to [facility] on [address]. She is requesting a 90 day supply per insurance

## 2017-05-20 MED FILL — LISINOPRIL 40 MG TAB: 40 | 30 days supply | Qty: 30 | Fill #6

## 2017-05-20 MED FILL — ?DULOXETINE HCL DR 60 MG CA: 60 MG | 30 days supply | Qty: 30 | Fill #1

## 2017-05-20 MED FILL — ?ATORVASTATIN 10 MG TABLET: 10 | 30 days supply | Qty: 30 | Fill #2

## 2017-05-21 ENCOUNTER — Ambulatory Visit (HOSPITAL_COMMUNITY): Payer: Self-pay

## 2017-06-19 ENCOUNTER — Telehealth: Payer: Self-pay | Admitting: Adult Health

## 2017-06-19 MED ORDER — ATORVASTATIN CALCIUM 10 MG PO TABS: 10.0000 mg | ORAL_TABLET | Freq: Every day | ORAL | 0 refills | Status: DC

## 2017-06-19 NOTE — Telephone Encounter (Signed)
Pt called for Rx refill :  atorvastatin (LIPITOR) 10 MG tablet [[id number]]  Order Details  Dose: 10 mg Route: Oral Frequency: Daily  Dispense Quantity: 90 tablet Refills: 3 Fills remaining: --        Sig: Take 1 tablet (10 mg total) by mouth daily.          Pt request Rx be sent to: Pharmacy:  [facility] ([facility]),  - [place]  ( not community clinic).  --glh

## 2017-07-06 ENCOUNTER — Telehealth: Payer: Self-pay | Admitting: Adult Health

## 2017-07-06 NOTE — Progress Notes (Deleted)
Subjective:    Patient ID: [name], female    DOB: March 21, 1961, 57 y.o.   MRN: [id number]  HPI: 01/08/18 OV:  [name] is here for f/u: HTN, depression, GERD,insomnia, HL, and fatigue. She has been compliant on all medications and denies SE.  She has been steadily increasing water and been consuming less caffeine.  She feels that her sleep has improved and has noticed a slight decline in overall fatigue. She was seen/evaluated by podiatry and a surgical plan was agreed upon. She feels that her mood is stable and denies thoughts of harming herself/others and has really enjoyed her new job at [employer].  She continues to avoid EOTH/tobacco.  07/07/17 OV: [name] presents for CPE  .The 10-year ASCVD risk score Mikey Bussing DC Brooke Bonito., et al., 2013) is: 2.6%   Values used to calculate the score:     Age: 55 years     Sex: Female     Is Non-Hispanic African American: No     Diabetic: No     Tobacco smoker: No     Systolic Blood Pressure: 063 mmHg     Is BP treated: Yes     HDL Cholesterol: 53 mg/dL     Total Cholesterol: 247 mg/dL   Healthcare maintenance: PAP- Mammogram- Colonoscopy-  Patient Care Team    Relationship Specialty Notifications Start End  Mina Marble D, NP PCP - General Family Medicine  06/23/16   Daneil Dolin, MD Consulting Physician Gastroenterology  03/08/14   Leandrew Koyanagi, MD Attending Physician Orthopedic Surgery  06/23/16   Hurley Cisco, MD Consulting Physician Rheumatology  06/23/16     Patient Active Problem List   Diagnosis Date Noted  . Family history of diabetes mellitus 06/23/2016  . Other fatigue 06/23/2016  . Insomnia 06/23/2016  . Rheumatoid arthritis ([place]) 06/23/2016  . Nontraumatic incomplete tear of right rotator cuff 04/28/2016  . Impingement syndrome of right shoulder 03/31/2016  . Status post right partial knee replacement 09/15/2014  . Acute medial meniscal injury of knee 06/14/2014  . Status post total knee replacement using  cement 06/08/2014  . Primary osteoarthritis of right knee 06/08/2014  . Screening for colon cancer   . Schatzki's ring   . Hiatal hernia   . Dysphagia, pharyngoesophageal phase 05/26/2014  . Encounter for screening colonoscopy 05/26/2014  . Adrenal adenoma 05/01/2014  . Breast lump 05/01/2014  . Metatarsalgia of both feet 01/12/2014  . Bilateral leg pain 01/11/2014  . Bilateral edema of lower extremity 01/11/2014  . Other osteoarthritis of spine, thoracolumbar region 12/26/2013  . Hypercholesteremia 05/19/2013  . Periodic health assessment, general screening, adult 04/13/2013  . GERD (gastroesophageal reflux disease) 04/13/2013  . HTN (hypertension) 04/13/2013  . Dyspnea 04/13/2013     Past Medical History:  Diagnosis Date  . Acid reflux    takes Zantac and Omeprazole daily  . Anxiety    takes Citaopram daily  . Arthritis    right knee  . Chronic back pain    DDD  . Clotting disorder ([place])   . History of blood clots    67yrs ago and in left leg  . History of bronchitis 3+yrs ago  . History of migraine    has been a while since last one  . Hyperlipidemia    takes Atorvastatin daily  . Hypertension    takes Lisinopril and HCTZ daily  . Insomnia    takes Elavil nightly as needed  . Joint pain   .  Joint swelling   . Paraesophageal hernia   . Weakness    numbness and tingling in both feet     Past Surgical History:  Procedure Laterality Date  . CHONDROPLASTY Right 06/28/2014   Procedure: CHONDROPLASTY;  Surgeon: Marianna Payment, MD;  Location: [facility];  Service: Orthopedics;  Laterality: Right;  . COLONOSCOPY N/A 05/29/2014   Procedure: COLONOSCOPY;  Surgeon: Daneil Dolin, MD;  [facility];  Service: Endoscopy;  Laterality: N/A;  215pm- Pt is working until 12:00 so she can't come any earlier  . ESOPHAGOGASTRODUODENOSCOPY N/A 05/29/2014   Procedure: ESOPHAGOGASTRODUODENOSCOPY (EGD);  Surgeon: Daneil Dolin, MD;  [facility];  Service: Endoscopy;  Laterality: N/A;  . GANGLION CYST EXCISION Left 01/13/2002  . KNEE ARTHROSCOPY WITH MEDIAL MENISECTOMY Right 06/28/2014   Procedure: RIGHT KNEE ARTHROSCOPY WITH PARTIAL MEDIAL MENISCECTOMY AND CHONDROPLASTY;  Surgeon: Marianna Payment, MD;  Location: [facility];  Service: Orthopedics;  Laterality: Right;  . MALONEY DILATION N/A 05/29/2014   Procedure: Venia Minks DILATION;  Surgeon: Daneil Dolin, MD;  [facility];  Service: Endoscopy;  Laterality: N/A;  . PARTIAL KNEE ARTHROPLASTY Right 09/15/2014   Procedure: RIGHT UNICOMPARTMENTAL KNEE ARTHROPLASTY;  Surgeon: Leandrew Koyanagi, MD;  Location: [facility];  Service: Orthopedics;  Laterality: Right;  . TOTAL KNEE ARTHROPLASTY Left 04/08/2004     Family History  Problem Relation Age of Onset  . Cancer Father        lung  . Breast cancer Sister   . Cancer Sister        breast  . Cancer Maternal Grandfather        lung  . Cancer Paternal Grandfather        lung  . Diabetes Son   . Colon cancer Neg Hx      Social History   Substance and Sexual Activity  Drug Use No     Social History   Substance and Sexual Activity  Alcohol Use No  . Alcohol/week: 0.0 oz     Social History   Tobacco Use  Smoking Status Never Smoker  Smokeless Tobacco Never Used     Outpatient Encounter Medications as of 07/07/2017  Medication Sig  . atorvastatin (LIPITOR) 10 MG tablet Take 1 tablet (10 mg total) by mouth daily.  . DULoxetine (CYMBALTA) 60 MG capsule Take 1 capsule (60 mg total) by mouth daily.  . hydrochlorothiazide (HYDRODIURIL) 50 MG tablet Take 1 tablet (50 mg total) by mouth daily.  Marland Kitchen ibuprofen (ADVIL,MOTRIN) 600 MG tablet Take 1 tablet (600 mg total) by mouth every 8 (eight) hours as needed.  Marland Kitchen lisinopril (PRINIVIL,ZESTRIL) 40 MG tablet Take 1 tablet (40 mg total) by mouth daily.  . methocarbamol (ROBAXIN) 500 MG tablet Take 1 tablet (500 mg total) by mouth every 6 (six) hours as needed  for muscle spasms.  Marland Kitchen omeprazole (PRILOSEC) 20 MG capsule Take 1 capsule (20 mg total) by mouth 2 (two) times daily before a meal.  . [DISCONTINUED] fluticasone (FLONASE) 50 MCG/ACT nasal spray Place 1 spray into the nose 2 (two) times daily.  . [DISCONTINUED] furosemide (LASIX) 20 MG tablet Take 3 tablets (60 mg total) by mouth daily.   No facility-administered encounter medications on file as of 07/07/2017.     Allergies: Tramadol  There is no height or weight on file to calculate BMI.  Last menstrual period 02/07/2013.     Review of Systems  Constitutional: Negative for activity  change, appetite change, chills, diaphoresis, fatigue, fever and unexpected weight change.  HENT: Negative for congestion.   Eyes: Negative for visual disturbance.  Respiratory: Negative for cough, chest tightness, shortness of breath, wheezing and stridor.   Cardiovascular: Negative for chest pain, palpitations and leg swelling.  Gastrointestinal: Negative for abdominal distention, abdominal pain, blood in stool, constipation, diarrhea, nausea and vomiting.  Endocrine: Negative for cold intolerance, heat intolerance, polydipsia, polyphagia and polyuria.  Genitourinary: Negative for difficulty urinating and flank pain.  Musculoskeletal: Negative for arthralgias, back pain, gait problem, joint swelling, myalgias, neck pain and neck stiffness.  Skin: Negative for color change, pallor, rash and wound.  Neurological: Negative for dizziness and headaches.  Hematological: Does not bruise/bleed easily.  Psychiatric/Behavioral: Positive for sleep disturbance.       Objective:   Physical Exam  Constitutional: She is oriented to person, place, and time. She appears well-developed and well-nourished. No distress.  HENT:  Head: Normocephalic and atraumatic.  Right Ear: External ear normal.  Left Ear: External ear normal.  Eyes: Conjunctivae are normal. Pupils are equal, round, and reactive to light.  Neck:  Normal range of motion. Neck supple.  Cardiovascular: Normal rate, regular rhythm, normal heart sounds and intact distal pulses.  No murmur heard. Pulmonary/Chest: Effort normal and breath sounds normal. No respiratory distress. She has no wheezes. She has no rales. She exhibits no tenderness.  Lymphadenopathy:    She has no cervical adenopathy.  Neurological: She is alert and oriented to person, place, and time.  Skin: Skin is warm and dry. No rash noted. She is not diaphoretic. No erythema. No pallor.  Psychiatric: She has a normal mood and affect. Her behavior is normal. Judgment and thought content normal.  Nursing note and vitals reviewed.         Assessment & Plan:   No diagnosis found.  No problem-specific Assessment & Plan notes found for this encounter.    FOLLOW-UP:  No Follow-up on file.

## 2017-07-06 NOTE — Telephone Encounter (Signed)
Patient is requesting a refill of her duloxetine, if approved please send to [facility] on [address]

## 2017-07-06 NOTE — Telephone Encounter (Signed)
LVM informing pt that refill request will be addressed at her appt on 07/07/17.  Charyl Bigger, CMA

## 2017-07-07 ENCOUNTER — Encounter: Payer: Self-pay | Admitting: Adult Health

## 2017-07-09 ENCOUNTER — Telehealth: Payer: Self-pay | Admitting: Adult Health

## 2017-07-09 MED ORDER — DULOXETINE HCL 60 MG PO CPEP: 60.0000 mg | ORAL_CAPSULE | Freq: Every day | ORAL | 0 refills | Status: DC

## 2017-07-09 NOTE — Addendum Note (Signed)
Addended by: Fonnie Mu on: 07/09/2017 01:16 PM   Modules accepted: Orders

## 2017-07-09 NOTE — Telephone Encounter (Signed)
Patient is requesting a refill of her Duloxetine, it was to be addressed on her appt that she had sch on 2/26 but we had to c/x that appt. She is currently out of this med and we did not have and available appt that worked with her sch until 08/11/17. Please advise and if approved please send to [facility] on [address]

## 2017-07-16 ENCOUNTER — Ambulatory Visit (HOSPITAL_COMMUNITY)
Admission: RE | Admit: 2017-07-16 | Discharge: 2017-07-16 | Disposition: A | Discharge status: Home / Self Care | Payer: Self-pay | Source: Ambulatory Visit | Attending: Obstetrics and Gynecology | Admitting: Obstetrics and Gynecology

## 2017-07-16 ENCOUNTER — Ambulatory Visit
Admission: RE | Admit: 2017-07-16 | Discharge: 2017-07-16 | Disposition: A | Discharge status: Home / Self Care | Payer: No Typology Code available for payment source | Source: Ambulatory Visit | Attending: Obstetrics and Gynecology | Admitting: Obstetrics and Gynecology

## 2017-07-16 ENCOUNTER — Encounter (HOSPITAL_COMMUNITY): Payer: Self-pay

## 2017-07-16 VITALS — BP 149/86 | Ht 64.0 in

## 2017-07-16 DIAGNOSIS — Z01419 Encounter for gynecological examination (general) (routine) without abnormal findings: Secondary | ICD-10-CM

## 2017-07-16 DIAGNOSIS — Z1231 Encounter for screening mammogram for malignant neoplasm of breast: Secondary | ICD-10-CM

## 2017-07-16 NOTE — Addendum Note (Signed)
Encounter addended by: Armond Hang, LPN on: 09/10/800 23:36 PM  Actions taken: Order list changed

## 2017-07-16 NOTE — Patient Instructions (Signed)
Explained breast self awareness with [name]. Let patient know [facility] will cover Pap smears and HPV typing every 5 years unless has a history of abnormal Pap smears. Referred patient to the [facility] for a screening mammogram. Appointment scheduled for Thursday, July 16, 2017 at [place]. Let patient know will follow up with her within the next couple weeks with results with results of Pap smear by letter or phone. Informed patient that the [facility] will follow-up with her within the next couple of weeks with results of mammogram by letter or phone. [name] verbalized understanding.  [place]ann[place], Arvil Chaco, RN 11:57 AM

## 2017-07-16 NOTE — Progress Notes (Signed)
No complaints today.   Pap Smear: Pap smear completed today. Last Pap smear was 03/01/2013 at [facility] and normal. Per patient has no history of an abnormal Pap smear. Last Pap smear result is in Epic.  Physical exam: Breasts Breasts symmetrical. No skin abnormalities bilateral breasts. No nipple retraction bilateral breasts. No nipple discharge bilateral breasts. No lymphadenopathy. No lumps palpated bilateral breasts. No complaints of pain or tenderness on exam. Referred patient to the [facility] for a screening mammogram. Appointment scheduled for Thursday, July 16, 2017 at [place].        Pelvic/Bimanual   Ext Genitalia No lesions, no swelling and no discharge observed on external genitalia.         Vagina Vagina pink and normal texture. No lesions or discharge observed in vagina.          Cervix Cervix is present. Cervix pink and of normal texture. No discharge observed.     Uterus Uterus is present and palpable. Uterus in normal position and normal size.        Adnexae Bilateral ovaries present and palpable. No tenderness on palpation.         Rectovaginal No rectal exam completed today since patient had no rectal complaints. No skin abnormalities observed on exam.    Smoking History: Patient has never smoked.  Patient Navigation: Patient education provided. Access to services provided for patient through [place] program.   Colorectal Cancer Screening: Per patient had a colonoscopy completed 2-3 years ago. No complaints today. FIT Test given to patient to complete and return to [facility].  Breast and Cervical Cancer Risk Assessment: Patient has a family history of her sister and paternal aunt being diagnosed with breast cancer. Patient has no known genetic mutations or history of radiation treatment to the chest before age 14. Patient has no history of cervical dysplasia, immunocompromised, or DES exposure in-utero.

## 2017-07-17 ENCOUNTER — Encounter (HOSPITAL_COMMUNITY): Payer: Self-pay | Admitting: *Deleted

## 2017-07-17 LAB — CYTOLOGY - PAP
DIAGNOSIS: NEGATIVE
HPV (WINDOPATH): NOT DETECTED

## 2017-07-30 ENCOUNTER — Telehealth: Payer: Self-pay | Admitting: Adult Health

## 2017-07-30 DIAGNOSIS — K219 Gastro-esophageal reflux disease without esophagitis: Secondary | ICD-10-CM

## 2017-07-30 MED ORDER — OMEPRAZOLE 20 MG PO CPDR: 20.0000 mg | DELAYED_RELEASE_CAPSULE | Freq: Two times a day (BID) | ORAL | 3 refills | Status: DC

## 2017-07-30 NOTE — Telephone Encounter (Signed)
Patient is requesting a refill of her omeprazole, if approved please send to [facility] on [address]

## 2017-07-30 NOTE — Addendum Note (Signed)
Addended by: Fonnie Mu on: 07/30/2017 03:47 PM   Modules accepted: Orders

## 2017-08-04 NOTE — Progress Notes (Signed)
Subjective:    Patient ID: [name], female    DOB: 23-Jul-1960, 57 y.o.   MRN: [id number]  HPI: 01/08/17 OV: [name] is here for f/u: HTN, depression, GERD,insomnia, HLD, and fatigue. She has been compliant on all medications and denies SE.  She has been steadily increasing water and been consuming less caffeine.  She feels that her sleep has improved and has noticed a slight decline in overall fatigue. She was seen/evaluated by podiatry and a surgical plan was agreed upon. She feels that her mood is stable and denies thoughts of harming herself/others and has really enjoyed her new job at [employer].  She continues to avoid EOTH/tobacco.  08/11/17 OV: [name] is here for CPE. She cancelled podiatry surgery due to financial hardships. She reports increased fatigue, that has been worsening > 6 months. She has not seen her Rheumatologist for her RA and also stopped seeing GI specialist due to lack of medical insurance. If she does not take Omeprazole 20mg  BID her GERD "it out of control bad". She feels that her fibromyalgia is not improving.  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  She does not exercise and eats a diet high in CHO. She hydrates with soda pop and drinks very little plain water. She denies tobacco/ETOH use. Healthcare Maintenance: PAP- completed 3/19  Mammogram- completed 3/19 Colonoscopy- due 2026  Patient Care Team    Relationship Specialty Notifications Start End  Mina Marble D, NP PCP - General Family Medicine  06/23/16   Daneil Dolin, MD Consulting Physician Gastroenterology  03/08/14   Leandrew Koyanagi, MD Attending Physician Orthopedic Surgery  06/23/16   Hurley Cisco, MD Consulting Physician Rheumatology  06/23/16     Patient Active Problem List   Diagnosis Date Noted  . Fibromyalgia 08/11/2017  . Family history of diabetes mellitus 06/23/2016  . Other fatigue 06/23/2016  . Insomnia 06/23/2016  .  Rheumatoid arthritis ([place]) 06/23/2016  . Nontraumatic incomplete tear of right rotator cuff 04/28/2016  . Impingement syndrome of right shoulder 03/31/2016  . Status post right partial knee replacement 09/15/2014  . Acute medial meniscal injury of knee 06/14/2014  . Status post total knee replacement using cement 06/08/2014  . Primary osteoarthritis of right knee 06/08/2014  . Screening for colon cancer   . Schatzki's ring   . Hiatal hernia   . Dysphagia, pharyngoesophageal phase 05/26/2014  . Encounter for screening colonoscopy 05/26/2014  . Adrenal adenoma 05/01/2014  . Breast lump 05/01/2014  . Metatarsalgia of both feet 01/12/2014  . Bilateral leg pain 01/11/2014  . Bilateral edema of lower extremity 01/11/2014  . Other osteoarthritis of spine, thoracolumbar region 12/26/2013  . Hypercholesteremia 05/19/2013  . Periodic health assessment, general screening, adult 04/13/2013  . GERD (gastroesophageal reflux disease) 04/13/2013  . HTN (hypertension) 04/13/2013  . Dyspnea 04/13/2013     Past Medical History:  Diagnosis Date  . Acid reflux    takes Zantac and Omeprazole daily  . Anxiety    takes Citaopram daily  . Arthritis    right knee  . Chronic back pain    DDD  . Clotting disorder ([place])   . History of blood clots    6yrs ago and in left leg  . History of bronchitis 3+yrs ago  . History of migraine    has been a while since last one  . Hyperlipidemia    takes Atorvastatin daily  . Hypertension  takes Lisinopril and HCTZ daily  . Insomnia    takes Elavil nightly as needed  . Joint pain   . Joint swelling   . Paraesophageal hernia   . Weakness    numbness and tingling in both feet     Past Surgical History:  Procedure Laterality Date  . BREAST BIOPSY Left   . CHONDROPLASTY Right 06/28/2014   Procedure: CHONDROPLASTY;  Surgeon: Marianna Payment, MD;  Location: [place];  Service: Orthopedics;  Laterality: Right;  . COLONOSCOPY N/A  05/29/2014   Procedure: COLONOSCOPY;  Surgeon: Daneil Dolin, MD;  [facility];  Service: Endoscopy;  Laterality: N/A;  215pm- Pt is working until 12:00 so she can't come any earlier  . ESOPHAGOGASTRODUODENOSCOPY N/A 05/29/2014   Procedure: ESOPHAGOGASTRODUODENOSCOPY (EGD);  Surgeon: Daneil Dolin, MD;  [facility];  Service: Endoscopy;  Laterality: N/A;  . GANGLION CYST EXCISION Left 01/13/2002  . KNEE ARTHROSCOPY WITH MEDIAL MENISECTOMY Right 06/28/2014   Procedure: RIGHT KNEE ARTHROSCOPY WITH PARTIAL MEDIAL MENISCECTOMY AND CHONDROPLASTY;  Surgeon: Marianna Payment, MD;  Location: [facility];  Service: Orthopedics;  Laterality: Right;  . MALONEY DILATION N/A 05/29/2014   Procedure: Venia Minks DILATION;  Surgeon: Daneil Dolin, MD;  [facility];  Service: Endoscopy;  Laterality: N/A;  . PARTIAL KNEE ARTHROPLASTY Right 09/15/2014   Procedure: RIGHT UNICOMPARTMENTAL KNEE ARTHROPLASTY;  Surgeon: Leandrew Koyanagi, MD;  Location: [place];  Service: Orthopedics;  Laterality: Right;  . TOTAL KNEE ARTHROPLASTY Left 04/08/2004     Family History  Problem Relation Age of Onset  . Cancer Father        lung  . Cancer Sister        breast  . Breast cancer Sister 29  . Cancer Maternal Grandfather        lung  . Cancer Paternal Grandfather        lung  . Diabetes Son   . Colon cancer Neg Hx      Social History   Substance and Sexual Activity  Drug Use No     Social History   Substance and Sexual Activity  Alcohol Use No  . Alcohol/week: 0.0 oz     Social History   Tobacco Use  Smoking Status Never Smoker  Smokeless Tobacco Never Used     Outpatient Encounter Medications as of 08/11/2017  Medication Sig  . atorvastatin (LIPITOR) 10 MG tablet Take 1 tablet (10 mg total) by mouth daily.  . DULoxetine (CYMBALTA) 60 MG capsule Take 1 capsule (60 mg total) by mouth daily.  . hydrochlorothiazide (HYDRODIURIL) 50 MG tablet Take 1 tablet (50 mg  total) by mouth daily.  Marland Kitchen ibuprofen (ADVIL,MOTRIN) 600 MG tablet Take 1 tablet (600 mg total) by mouth every 8 (eight) hours as needed.  Marland Kitchen lisinopril (PRINIVIL,ZESTRIL) 40 MG tablet Take 1 tablet (40 mg total) by mouth daily. NO FURTHER REFILLS UNTIL IS SEEN IN THE OFFICE.  . methocarbamol (ROBAXIN) 500 MG tablet Take 1 tablet (500 mg total) by mouth every 6 (six) hours as needed for muscle spasms.  Marland Kitchen omeprazole (PRILOSEC) 20 MG capsule Take 1 capsule (20 mg total) by mouth 2 (two) times daily before a meal.  . [DISCONTINUED] lisinopril (PRINIVIL,ZESTRIL) 40 MG tablet Take 1 tablet (40 mg total) by mouth daily.   No facility-administered encounter medications on file as of 08/11/2017.     Allergies: Tramadol  Body mass index is 35.77 kg/m.  Blood pressure  111/75, pulse 98, height 5\' 4"  (1.626 m), weight 208 lb 6.4 oz (94.5 kg), last menstrual period 02/07/2013, SpO2 99 %.  Review of Systems  Constitutional: Negative for activity change, appetite change, chills, diaphoresis, fatigue, fever and unexpected weight change.  HENT: Negative for congestion.   Eyes: Negative for visual disturbance.  Respiratory: Negative for cough, chest tightness, shortness of breath, wheezing and stridor.   Cardiovascular: Negative for chest pain, palpitations and leg swelling.  Gastrointestinal: Negative for abdominal distention, abdominal pain, blood in stool, constipation, diarrhea, nausea and vomiting.  Endocrine: Negative for cold intolerance, heat intolerance, polydipsia, polyphagia and polyuria.  Genitourinary: Negative for difficulty urinating and flank pain.  Musculoskeletal: Negative for arthralgias, back pain, gait problem, joint swelling, myalgias, neck pain and neck stiffness.  Skin: Negative for color change, pallor, rash and wound.  Neurological: Negative for dizziness and headaches.  Hematological: Does not bruise/bleed easily.  Psychiatric/Behavioral: Positive for sleep disturbance.        Objective:   Physical Exam  Constitutional: She is oriented to person, place, and time. She appears well-developed and well-nourished. No distress.  HENT:  Head: Normocephalic and atraumatic.  Right Ear: Tympanic membrane, external ear and ear canal normal. Tympanic membrane is not erythematous and not bulging. No decreased hearing is noted.  Left Ear: Hearing, tympanic membrane, external ear and ear canal normal. Tympanic membrane is not erythematous and not bulging. No decreased hearing is noted.  Nose: Nose normal. No mucosal edema or rhinorrhea. Right sinus exhibits no frontal sinus tenderness. Left sinus exhibits no maxillary sinus tenderness and no frontal sinus tenderness.  Mouth/Throat: Oropharynx is clear and moist and mucous membranes are normal. Abnormal dentition. No oropharyngeal exudate, posterior oropharyngeal edema, posterior oropharyngeal erythema or tonsillar abscesses.  Eyes: Pupils are equal, round, and reactive to light. Conjunctivae are normal.  Neck: Normal range of motion. Neck supple.  Cardiovascular: Normal rate, regular rhythm, normal heart sounds and intact distal pulses.  No murmur heard. Pulmonary/Chest: Effort normal and breath sounds normal. No respiratory distress. She has no wheezes. She has no rales. She exhibits no tenderness.  Abdominal: Soft. Bowel sounds are normal. She exhibits no distension and no mass. There is no tenderness. There is no rebound and no guarding.  Musculoskeletal: She exhibits tenderness.  Lymphadenopathy:    She has no cervical adenopathy.  Neurological: She is alert and oriented to person, place, and time.  Skin: Skin is warm and dry. No rash noted. She is not diaphoretic. No erythema. No pallor.  Psychiatric: She has a normal mood and affect. Her behavior is normal. Judgment and thought content normal.  Nursing note and vitals reviewed.         Assessment & Plan:   1. Essential hypertension   2. Gastroesophageal reflux  disease, esophagitis presence not specified   3. Rheumatoid arthritis involving multiple sites, unspecified rheumatoid factor presence ([place])   4. Bilateral edema of lower extremity   5. Other fatigue   6. Fibromyalgia     HTN (hypertension) BP at goal 111/75, HR 98 Currently on Lisinopril 40mg  QD and HCTZ 50 mg QD   GERD (gastroesophageal reflux disease) Advised to be seen by GI and reduce spicy/acidic foods in diet. Advised not to eat too closely to HS Currently on Omeprazole 20mg  BID  Rheumatoid arthritis ([place]) Advised to f/u with Rheumatologist Not on any RA meds currently   Bilateral edema of lower extremity Continue HCTZ 50mg  QD Needs CMP  Other fatigue Steadily increasing > 6 months  Needs TSH drawn Recommend Myoview Stress test- she declined due to lack of health insurance  Fibromyalgia Currently on Duloxetine 60mg  QD Recommend increasing daily walking/stretching before increasing rx- studies have shown little sx control wit duloxetine doses > 60mg  QD  FOLLOW-UP:  Return in about 3 months (around 11/10/2017) for Regular Follow Up, HTN, Fatigue, Fibromyalgia.

## 2017-08-06 ENCOUNTER — Telehealth: Payer: Self-pay | Admitting: Adult Health

## 2017-08-06 DIAGNOSIS — I10 Essential (primary) hypertension: Secondary | ICD-10-CM

## 2017-08-06 MED ORDER — LISINOPRIL 40 MG PO TABS: 40.0000 mg | ORAL_TABLET | Freq: Every day | ORAL | 0 refills | Status: DC

## 2017-08-06 NOTE — Telephone Encounter (Signed)
Patient is requesting a refill of her lisinopril, if approved please send to [facility] on [address]. Patient has an appt next week but will be out before then

## 2017-08-06 NOTE — Addendum Note (Signed)
Addended by: Fonnie Mu on: 08/06/2017 12:13 PM   Modules accepted: Orders

## 2017-08-11 ENCOUNTER — Telehealth: Payer: Self-pay | Admitting: Adult Health

## 2017-08-11 ENCOUNTER — Encounter: Payer: Self-pay | Admitting: Adult Health

## 2017-08-11 ENCOUNTER — Ambulatory Visit (INDEPENDENT_AMBULATORY_CARE_PROVIDER_SITE_OTHER): Payer: Self-pay | Admitting: Adult Health

## 2017-08-11 VITALS — BP 111/75 | HR 98 | Ht 64.0 in | Wt 208.4 lb

## 2017-08-11 DIAGNOSIS — R600 Localized edema: Secondary | ICD-10-CM

## 2017-08-11 DIAGNOSIS — M069 Rheumatoid arthritis, unspecified: Secondary | ICD-10-CM

## 2017-08-11 DIAGNOSIS — R5383 Other fatigue: Secondary | ICD-10-CM

## 2017-08-11 DIAGNOSIS — I10 Essential (primary) hypertension: Secondary | ICD-10-CM

## 2017-08-11 DIAGNOSIS — K219 Gastro-esophageal reflux disease without esophagitis: Secondary | ICD-10-CM

## 2017-08-11 DIAGNOSIS — M797 Fibromyalgia: Secondary | ICD-10-CM | POA: Insufficient documentation

## 2017-08-11 NOTE — Patient Instructions (Addendum)
Preventive Care for Adults, Female  A healthy lifestyle and preventive care can promote health and wellness. Preventive health guidelines for women include the following key practices.   A routine yearly physical is a good way to check with your health care provider about your health and preventive screening. It is a chance to share any concerns and updates on your health and to receive a thorough exam.   Visit your dentist for a routine exam and preventive care every 6 months. Brush your teeth twice a day and floss once a day. Good oral hygiene prevents tooth decay and gum disease.   The frequency of eye exams is based on your age, health, family medical history, use of contact lenses, and other factors. Follow your health care provider's recommendations for frequency of eye exams.   Eat a healthy diet. Foods like vegetables, fruits, whole grains, low-fat dairy products, and lean protein foods contain the nutrients you need without too many calories. Decrease your intake of foods high in solid fats, added sugars, and salt. Eat the right amount of calories for you.Get information about a proper diet from your health care provider, if necessary.   Regular physical exercise is one of the most important things you can do for your health. Most adults should get at least 150 minutes of moderate-intensity exercise (any activity that increases your heart rate and causes you to sweat) each week. In addition, most adults need muscle-strengthening exercises on 2 or more days a week.   Maintain a healthy weight. The body mass index (BMI) is a screening tool to identify possible weight problems. It provides an estimate of body fat based on height and weight. Your health care provider can find your BMI, and can help you achieve or maintain a healthy weight.For adults 20 years and older:   - A BMI below 18.5 is considered underweight.   - A BMI of 18.5 to 24.9 is normal.   - A BMI of 25 to 29.9 is  considered overweight.   - A BMI of 30 and above is considered obese.   Maintain normal blood lipids and cholesterol levels by exercising and minimizing your intake of trans and saturated fats.  Eat a balanced diet with plenty of fruit and vegetables. Blood tests for lipids and cholesterol should begin at age 20 and be repeated every 5 years minimum.  If your lipid or cholesterol levels are high, you are over 40, or you are at high risk for heart disease, you may need your cholesterol levels checked more frequently.Ongoing high lipid and cholesterol levels should be treated with medicines if diet and exercise are not working.   If you smoke, find out from your health care provider how to quit. If you do not use tobacco, do not start.   Lung cancer screening is recommended for adults aged 55-80 years who are at high risk for developing lung cancer because of a history of smoking. A yearly low-dose CT scan of the lungs is recommended for people who have at least a 30-pack-year history of smoking and are a current smoker or have quit within the past 15 years. A pack year of smoking is smoking an average of 1 pack of cigarettes a day for 1 year (for example: 1 pack a day for 30 years or 2 packs a day for 15 years). Yearly screening should continue until the smoker has stopped smoking for at least 15 years. Yearly screening should be stopped for people who develop a   health problem that would prevent them from having lung cancer treatment.   If you are pregnant, do not drink alcohol. If you are breastfeeding, be very cautious about drinking alcohol. If you are not pregnant and choose to drink alcohol, do not have more than 1 drink per day. One drink is considered to be 12 ounces (355 mL) of beer, 5 ounces (148 mL) of wine, or 1.5 ounces (44 mL) of liquor.   Avoid use of street drugs. Do not share needles with anyone. Ask for help if you need support or instructions about stopping the use of  drugs.   High blood pressure causes heart disease and increases the risk of stroke. Your blood pressure should be checked at least yearly.  Ongoing high blood pressure should be treated with medicines if weight loss and exercise do not work.   If you are 69-55 years old, ask your health care provider if you should take aspirin to prevent strokes.   Diabetes screening involves taking a blood sample to check your fasting blood sugar level. This should be done once every 3 years, after age 38, if you are within normal weight and without risk factors for diabetes. Testing should be considered at a younger age or be carried out more frequently if you are overweight and have at least 1 risk factor for diabetes.   Breast cancer screening is essential preventive care for women. You should practice "breast self-awareness."  This means understanding the normal appearance and feel of your breasts and may include breast self-examination.  Any changes detected, no matter how small, should be reported to a health care provider.  Women in their 80s and 30s should have a clinical breast exam (CBE) by a health care provider as part of a regular health exam every 1 to 3 years.  After age 66, women should have a CBE every year.  Starting at age 1, women should consider having a mammogram (breast X-ray test) every year.  Women who have a family history of breast cancer should talk to their health care provider about genetic screening.  Women at a high risk of breast cancer should talk to their health care providers about having an MRI and a mammogram every year.   -Breast cancer gene (BRCA)-related cancer risk assessment is recommended for women who have family members with BRCA-related cancers. BRCA-related cancers include breast, ovarian, tubal, and peritoneal cancers. Having family members with these cancers may be associated with an increased risk for harmful changes (mutations) in the breast cancer genes BRCA1 and  BRCA2. Results of the assessment will determine the need for genetic counseling and BRCA1 and BRCA2 testing.   The Pap test is a screening test for cervical cancer. A Pap test can show cell changes on the cervix that might become cervical cancer if left untreated. A Pap test is a procedure in which cells are obtained and examined from the lower end of the uterus (cervix).   - Women should have a Pap test starting at age 57.   - Between ages 90 and 70, Pap tests should be repeated every 2 years.   - Beginning at age 63, you should have a Pap test every 3 years as long as the past 3 Pap tests have been normal.   - Some women have medical problems that increase the chance of getting cervical cancer. Talk to your health care provider about these problems. It is especially important to talk to your health care provider if a  new problem develops soon after your last Pap test. In these cases, your health care provider may recommend more frequent screening and Pap tests.   - The above recommendations are the same for women who have or have not gotten the vaccine for human papillomavirus (HPV).   - If you had a hysterectomy for a problem that was not cancer or a condition that could lead to cancer, then you no longer need Pap tests. Even if you no longer need a Pap test, a regular exam is a good idea to make sure no other problems are starting.   - If you are between ages 36 and 66 years, and you have had normal Pap tests going back 10 years, you no longer need Pap tests. Even if you no longer need a Pap test, a regular exam is a good idea to make sure no other problems are starting.   - If you have had past treatment for cervical cancer or a condition that could lead to cancer, you need Pap tests and screening for cancer for at least 20 years after your treatment.   - If Pap tests have been discontinued, risk factors (such as a new sexual partner) need to be reassessed to determine if screening should  be resumed.   - The HPV test is an additional test that may be used for cervical cancer screening. The HPV test looks for the virus that can cause the cell changes on the cervix. The cells collected during the Pap test can be tested for HPV. The HPV test could be used to screen women aged 70 years and older, and should be used in women of any age who have unclear Pap test results. After the age of 67, women should have HPV testing at the same frequency as a Pap test.   Colorectal cancer can be detected and often prevented. Most routine colorectal cancer screening begins at the age of 57 years and continues through age 26 years. However, your health care provider may recommend screening at an earlier age if you have risk factors for colon cancer. On a yearly basis, your health care provider may provide home test kits to check for hidden blood in the stool.  Use of a small camera at the end of a tube, to directly examine the colon (sigmoidoscopy or colonoscopy), can detect the earliest forms of colorectal cancer. Talk to your health care provider about this at age 23, when routine screening begins. Direct exam of the colon should be repeated every 5 -10 years through age 49 years, unless early forms of pre-cancerous polyps or small growths are found.   People who are at an increased risk for hepatitis B should be screened for this virus. You are considered at high risk for hepatitis B if:  -You were born in a country where hepatitis B occurs often. Talk with your health care provider about which countries are considered high risk.  - Your parents were born in a high-risk country and you have not received a shot to protect against hepatitis B (hepatitis B vaccine).  - You have HIV or AIDS.  - You use needles to inject street drugs.  - You live with, or have sex with, someone who has Hepatitis B.  - You get hemodialysis treatment.  - You take certain medicines for conditions like cancer, organ  transplantation, and autoimmune conditions.   Hepatitis C blood testing is recommended for all people born from [birth year] through 1965 and any individual  with known risks for hepatitis C.   Practice safe sex. Use condoms and avoid high-risk sexual practices to reduce the spread of sexually transmitted infections (STIs). STIs include gonorrhea, chlamydia, syphilis, trichomonas, herpes, HPV, and human immunodeficiency virus (HIV). Herpes, HIV, and HPV are viral illnesses that have no cure. They can result in disability, cancer, and death. Sexually active women aged 25 years and younger should be checked for chlamydia. Older women with new or multiple partners should also be tested for chlamydia. Testing for other STIs is recommended if you are sexually active and at increased risk.   Osteoporosis is a disease in which the bones lose minerals and strength with aging. This can result in serious bone fractures or breaks. The risk of osteoporosis can be identified using a bone density scan. Women ages 65 years and over and women at risk for fractures or osteoporosis should discuss screening with their health care providers. Ask your health care provider whether you should take a calcium supplement or vitamin D to There are also several preventive steps women can take to avoid osteoporosis and resulting fractures or to keep osteoporosis from worsening. -->Recommendations include:  Eat a balanced diet high in fruits, vegetables, calcium, and vitamins.  Get enough calcium. The recommended total intake of is 1,200 mg daily; for best absorption, if taking supplements, divide doses into 250-500 mg doses throughout the day. Of the two types of calcium, calcium carbonate is best absorbed when taken with food but calcium citrate can be taken on an empty stomach.  Get enough vitamin D. NAMS and the National Osteoporosis Foundation recommend at least 1,000 IU per day for women age 50 and over who are at risk of vitamin D  deficiency. Vitamin D deficiency can be caused by inadequate sun exposure (for example, those who live in northern latitudes).  Avoid alcohol and smoking. Heavy alcohol intake (more than 7 drinks per week) increases the risk of falls and hip fracture and women smokers tend to lose bone more rapidly and have lower bone mass than nonsmokers. Stopping smoking is one of the most important changes women can make to improve their health and decrease risk for disease.  Be physically active every day. Weight-bearing exercise (for example, fast walking, hiking, jogging, and weight training) may strengthen bones or slow the rate of bone loss that comes with aging. Balancing and muscle-strengthening exercises can reduce the risk of falling and fracture.  Consider therapeutic medications. Currently, several types of effective drugs are available. Healthcare providers can recommend the type most appropriate for each woman.  Eliminate environmental factors that may contribute to accidents. Falls cause nearly 90% of all osteoporotic fractures, so reducing this risk is an important bone-health strategy. Measures include ample lighting, removing obstructions to walking, using nonskid rugs on floors, and placing mats and/or grab bars in showers.  Be aware of medication side effects. Some common medicines make bones weaker. These include a type of steroid drug called glucocorticoids used for arthritis and asthma, some antiseizure drugs, certain sleeping pills, treatments for endometriosis, and some cancer drugs. An overactive thyroid gland or using too much thyroid hormone for an underactive thyroid can also be a problem. If you are taking these medicines, talk to your doctor about what you can do to help protect your bones.reduce the rate of osteoporosis.    Menopause can be associated with physical symptoms and risks. Hormone replacement therapy is available to decrease symptoms and risks. You should talk to your  health care provider   about whether hormone replacement therapy is right for you.   Use sunscreen. Apply sunscreen liberally and repeatedly throughout the day. You should seek shade when your shadow is shorter than you. Protect yourself by wearing long sleeves, pants, a wide-brimmed hat, and sunglasses year round, whenever you are outdoors.   Once a month, do a whole body skin exam, using a mirror to look at the skin on your back. Tell your health care provider of new moles, moles that have irregular borders, moles that are larger than a pencil eraser, or moles that have changed in shape or color.   -Stay current with required vaccines (immunizations).   Influenza vaccine. All adults should be immunized every year.  Tetanus, diphtheria, and acellular pertussis (Td, Tdap) vaccine. Pregnant women should receive 1 dose of Tdap vaccine during each pregnancy. The dose should be obtained regardless of the length of time since the last dose. Immunization is preferred during the 27th 36th week of gestation. An adult who has not previously received Tdap or who does not know her vaccine status should receive 1 dose of Tdap. This initial dose should be followed by tetanus and diphtheria toxoids (Td) booster doses every 10 years. Adults with an unknown or incomplete history of completing a 3-dose immunization series with Td-containing vaccines should begin or complete a primary immunization series including a Tdap dose. Adults should receive a Td booster every 10 years.  Varicella vaccine. An adult without evidence of immunity to varicella should receive 2 doses or a second dose if she has previously received 1 dose. Pregnant females who do not have evidence of immunity should receive the first dose after pregnancy. This first dose should be obtained before leaving the health care facility. The second dose should be obtained 4 8 weeks after the first dose.  Human papillomavirus (HPV) vaccine. Females aged 13 26  years who have not received the vaccine previously should obtain the 3-dose series. The vaccine is not recommended for use in pregnant females. However, pregnancy testing is not needed before receiving a dose. If a female is found to be pregnant after receiving a dose, no treatment is needed. In that case, the remaining doses should be delayed until after the pregnancy. Immunization is recommended for any person with an immunocompromised condition through the age of 26 years if she did not get any or all doses earlier. During the 3-dose series, the second dose should be obtained 4 8 weeks after the first dose. The third dose should be obtained 24 weeks after the first dose and 16 weeks after the second dose.  Zoster vaccine. One dose is recommended for adults aged 60 years or older unless certain conditions are present.  Measles, mumps, and rubella (MMR) vaccine. Adults born before 1957 generally are considered immune to measles and mumps. Adults born in 1957 or later should have 1 or more doses of MMR vaccine unless there is a contraindication to the vaccine or there is laboratory evidence of immunity to each of the three diseases. A routine second dose of MMR vaccine should be obtained at least 28 days after the first dose for students attending postsecondary schools, health care workers, or international travelers. People who received inactivated measles vaccine or an unknown type of measles vaccine during 1963 1967 should receive 2 doses of MMR vaccine. People who received inactivated mumps vaccine or an unknown type of mumps vaccine before 1979 and are at high risk for mumps infection should consider immunization with 2 doses of   MMR vaccine. For females of childbearing age, rubella immunity should be determined. If there is no evidence of immunity, females who are not pregnant should be vaccinated. If there is no evidence of immunity, females who are pregnant should delay immunization until after pregnancy.  Unvaccinated health care workers born before [birth year] who lack laboratory evidence of measles, mumps, or rubella immunity or laboratory confirmation of disease should consider measles and mumps immunization with 2 doses of MMR vaccine or rubella immunization with 1 dose of MMR vaccine.  Pneumococcal 13-valent conjugate (PCV13) vaccine. When indicated, a person who is uncertain of her immunization history and has no record of immunization should receive the PCV13 vaccine. An adult aged 54 years or older who has certain medical conditions and has not been previously immunized should receive 1 dose of PCV13 vaccine. This PCV13 should be followed with a dose of pneumococcal polysaccharide (PPSV23) vaccine. The PPSV23 vaccine dose should be obtained at least 8 weeks after the dose of PCV13 vaccine. An adult aged 58 years or older who has certain medical conditions and previously received 1 or more doses of PPSV23 vaccine should receive 1 dose of PCV13. The PCV13 vaccine dose should be obtained 1 or more years after the last PPSV23 vaccine dose.  Pneumococcal polysaccharide (PPSV23) vaccine. When PCV13 is also indicated, PCV13 should be obtained first. All adults aged 58 years and older should be immunized. An adult younger than age 65 years who has certain medical conditions should be immunized. Any person who resides in a nursing home or long-term care facility should be immunized. An adult smoker should be immunized. People with an immunocompromised condition and certain other conditions should receive both PCV13 and PPSV23 vaccines. People with human immunodeficiency virus (HIV) infection should be immunized as soon as possible after diagnosis. Immunization during chemotherapy or radiation therapy should be avoided. Routine use of PPSV23 vaccine is not recommended for American Indians, [place] Natives, or people younger than 65 years unless there are medical conditions that require PPSV23 vaccine. When indicated,  people who have unknown immunization and have no record of immunization should receive PPSV23 vaccine. One-time revaccination 5 years after the first dose of PPSV23 is recommended for people aged 70 64 years who have chronic kidney failure, nephrotic syndrome, asplenia, or immunocompromised conditions. People who received 1 2 doses of PPSV23 before age 32 years should receive another dose of PPSV23 vaccine at age 96 years or later if at least 5 years have passed since the previous dose. Doses of PPSV23 are not needed for people immunized with PPSV23 at or after age 55 years.  Meningococcal vaccine. Adults with asplenia or persistent complement component deficiencies should receive 2 doses of quadrivalent meningococcal conjugate (MenACWY-D) vaccine. The doses should be obtained at least 2 months apart. Microbiologists working with certain meningococcal bacteria, [employer] recruits, people at risk during an outbreak, and people who travel to or live in countries with a high rate of meningitis should be immunized. A first-year college student up through age 58 years who is living in a residence hall should receive a dose if she did not receive a dose on or after her 16th birthday. Adults who have certain high-risk conditions should receive one or more doses of vaccine.  Hepatitis A vaccine. Adults who wish to be protected from this disease, have certain high-risk conditions, work with hepatitis A-infected animals, work in hepatitis A research labs, or travel to or work in countries with a high rate of hepatitis A should be  immunized. Adults who were previously unvaccinated and who anticipate close contact with an international adoptee during the first 60 days after arrival in the [place] from a country with a high rate of hepatitis A should be immunized.  Hepatitis B vaccine.  Adults who wish to be protected from this disease, have certain high-risk conditions, may be exposed to blood or other infectious  body fluids, are household contacts or sex partners of hepatitis B positive people, are clients or workers in certain care facilities, or travel to or work in countries with a high rate of hepatitis B should be immunized.  Haemophilus influenzae type b (Hib) vaccine. A previously unvaccinated person with asplenia or sickle cell disease or having a scheduled splenectomy should receive 1 dose of Hib vaccine. Regardless of previous immunization, a recipient of a hematopoietic stem cell transplant should receive a 3-dose series 6 12 months after her successful transplant. Hib vaccine is not recommended for adults with HIV infection.  Preventive Services / Frequency Ages 6 to 39years  Blood pressure check.** / Every 1 to 2 years.  Lipid and cholesterol check.** / Every 5 years beginning at age 39.  Clinical breast exam.** / Every 3 years for women in their 61s and 62s.  BRCA-related cancer risk assessment.** / For women who have family members with a BRCA-related cancer (breast, ovarian, tubal, or peritoneal cancers).  Pap test.** / Every 2 years from ages 47 through 85. Every 3 years starting at age 34 through age 12 or 74 with a history of 3 consecutive normal Pap tests.  HPV screening.** / Every 3 years from ages 46 through ages 43 to 54 with a history of 3 consecutive normal Pap tests.  Hepatitis C blood test.** / For any individual with known risks for hepatitis C.  Skin self-exam. / Monthly.  Influenza vaccine. / Every year.  Tetanus, diphtheria, and acellular pertussis (Tdap, Td) vaccine.** / Consult your health care provider. Pregnant women should receive 1 dose of Tdap vaccine during each pregnancy. 1 dose of Td every 10 years.  Varicella vaccine.** / Consult your health care provider. Pregnant females who do not have evidence of immunity should receive the first dose after pregnancy.  HPV vaccine. / 3 doses over 6 months, if 64 and younger. The vaccine is not recommended for use in  pregnant females. However, pregnancy testing is not needed before receiving a dose.  Measles, mumps, rubella (MMR) vaccine.** / You need at least 1 dose of MMR if you were born in 1957 or later. You may also need a 2nd dose. For females of childbearing age, rubella immunity should be determined. If there is no evidence of immunity, females who are not pregnant should be vaccinated. If there is no evidence of immunity, females who are pregnant should delay immunization until after pregnancy.  Pneumococcal 13-valent conjugate (PCV13) vaccine.** / Consult your health care provider.  Pneumococcal polysaccharide (PPSV23) vaccine.** / 1 to 2 doses if you smoke cigarettes or if you have certain conditions.  Meningococcal vaccine.** / 1 dose if you are age 71 to 37 years and a Market researcher living in a residence hall, or have one of several medical conditions, you need to get vaccinated against meningococcal disease. You may also need additional booster doses.  Hepatitis A vaccine.** / Consult your health care provider.  Hepatitis B vaccine.** / Consult your health care provider.  Haemophilus influenzae type b (Hib) vaccine.** / Consult your health care provider.  Ages 55 to 64years  Blood pressure check.** / Every 1 to 2 years.  Lipid and cholesterol check.** / Every 5 years beginning at age 20 years.  Lung cancer screening. / Every year if you are aged 55 80 years and have a 30-pack-year history of smoking and currently smoke or have quit within the past 15 years. Yearly screening is stopped once you have quit smoking for at least 15 years or develop a health problem that would prevent you from having lung cancer treatment.  Clinical breast exam.** / Every year after age 40 years.  BRCA-related cancer risk assessment.** / For women who have family members with a BRCA-related cancer (breast, ovarian, tubal, or peritoneal cancers).  Mammogram.** / Every year beginning at age 40  years and continuing for as long as you are in good health. Consult with your health care provider.  Pap test.** / Every 3 years starting at age 30 years through age 65 or 70 years with a history of 3 consecutive normal Pap tests.  HPV screening.** / Every 3 years from ages 30 years through ages 65 to 70 years with a history of 3 consecutive normal Pap tests.  Fecal occult blood test (FOBT) of stool. / Every year beginning at age 50 years and continuing until age 75 years. You may not need to do this test if you get a colonoscopy every 10 years.  Flexible sigmoidoscopy or colonoscopy.** / Every 5 years for a flexible sigmoidoscopy or every 10 years for a colonoscopy beginning at age 50 years and continuing until age 75 years.  Hepatitis C blood test.** / For all people born from 1945 through 1965 and any individual with known risks for hepatitis C.  Skin self-exam. / Monthly.  Influenza vaccine. / Every year.  Tetanus, diphtheria, and acellular pertussis (Tdap/Td) vaccine.** / Consult your health care provider. Pregnant women should receive 1 dose of Tdap vaccine during each pregnancy. 1 dose of Td every 10 years.  Varicella vaccine.** / Consult your health care provider. Pregnant females who do not have evidence of immunity should receive the first dose after pregnancy.  Zoster vaccine.** / 1 dose for adults aged 60 years or older.  Measles, mumps, rubella (MMR) vaccine.** / You need at least 1 dose of MMR if you were born in 1957 or later. You may also need a 2nd dose. For females of childbearing age, rubella immunity should be determined. If there is no evidence of immunity, females who are not pregnant should be vaccinated. If there is no evidence of immunity, females who are pregnant should delay immunization until after pregnancy.  Pneumococcal 13-valent conjugate (PCV13) vaccine.** / Consult your health care provider.  Pneumococcal polysaccharide (PPSV23) vaccine.** / 1 to 2 doses if  you smoke cigarettes or if you have certain conditions.  Meningococcal vaccine.** / Consult your health care provider.  Hepatitis A vaccine.** / Consult your health care provider.  Hepatitis B vaccine.** / Consult your health care provider.  Haemophilus influenzae type b (Hib) vaccine.** / Consult your health care provider.  Ages 65 years and over  Blood pressure check.** / Every 1 to 2 years.  Lipid and cholesterol check.** / Every 5 years beginning at age 20 years.  Lung cancer screening. / Every year if you are aged 55 80 years and have a 30-pack-year history of smoking and currently smoke or have quit within the past 15 years. Yearly screening is stopped once you have quit smoking for at least 15 years or develop a health problem that   would prevent you from having lung cancer treatment.  Clinical breast exam.** / Every year after age 103 years.  BRCA-related cancer risk assessment.** / For women who have family members with a BRCA-related cancer (breast, ovarian, tubal, or peritoneal cancers).  Mammogram.** / Every year beginning at age 36 years and continuing for as long as you are in good health. Consult with your health care provider.  Pap test.** / Every 3 years starting at age 5 years through age 85 or 10 years with 3 consecutive normal Pap tests. Testing can be stopped between 65 and 70 years with 3 consecutive normal Pap tests and no abnormal Pap or HPV tests in the past 10 years.  HPV screening.** / Every 3 years from ages 93 years through ages 70 or 45 years with a history of 3 consecutive normal Pap tests. Testing can be stopped between 65 and 70 years with 3 consecutive normal Pap tests and no abnormal Pap or HPV tests in the past 10 years.  Fecal occult blood test (FOBT) of stool. / Every year beginning at age 8 years and continuing until age 45 years. You may not need to do this test if you get a colonoscopy every 10 years.  Flexible sigmoidoscopy or colonoscopy.** /  Every 5 years for a flexible sigmoidoscopy or every 10 years for a colonoscopy beginning at age 69 years and continuing until age 68 years.  Hepatitis C blood test.** / For all people born from [birth year] through 1964 and any individual with known risks for hepatitis C.  Osteoporosis screening.** / A one-time screening for women ages 7 years and over and women at risk for fractures or osteoporosis.  Skin self-exam. / Monthly.  Influenza vaccine. / Every year.  Tetanus, diphtheria, and acellular pertussis (Tdap/Td) vaccine.** / 1 dose of Td every 10 years.  Varicella vaccine.** / Consult your health care provider.  Zoster vaccine.** / 1 dose for adults aged 5 years or older.  Pneumococcal 13-valent conjugate (PCV13) vaccine.** / Consult your health care provider.  Pneumococcal polysaccharide (PPSV23) vaccine.** / 1 dose for all adults aged 74 years and older.  Meningococcal vaccine.** / Consult your health care provider.  Hepatitis A vaccine.** / Consult your health care provider.  Hepatitis B vaccine.** / Consult your health care provider.  Haemophilus influenzae type b (Hib) vaccine.** / Consult your health care provider. ** Family history and personal history of risk and conditions may change your health care provider's recommendations. Document Released: 06/24/2001 Document Revised: 02/16/2013  [facility] Patient Information 2014 [place], [place].   EXERCISE AND DIET:  We recommended that you start or continue a regular exercise program for good health. Regular exercise means any activity that makes your heart beat faster and makes you sweat.  We recommend exercising at least 30 minutes per day at least 3 days a week, preferably 5.  We also recommend a diet low in fat and sugar / carbohydrates.  Inactivity, poor dietary choices and obesity can cause diabetes, heart attack, stroke, and kidney damage, among others.     ALCOHOL AND SMOKING:  Women should limit their alcohol intake to no  more than 7 drinks/beers/glasses of wine (combined, not each!) per week. Moderation of alcohol intake to this level decreases your risk of breast cancer and liver damage.  ( And of course, no recreational drugs are part of a healthy lifestyle.)  Also, you should not be smoking at all or even being exposed to second hand smoke. Most people know smoking can  cause cancer, and various heart and lung diseases, but did you know it also contributes to weakening of your bones?  Aging of your skin?  Yellowing of your teeth and nails?   CALCIUM AND VITAMIN D:  Adequate intake of calcium and Vitamin D are recommended.  The recommendations for exact amounts of these supplements seem to change often, but generally speaking 600 mg of calcium (either carbonate or citrate) and 800 units of Vitamin D per day seems prudent. Certain women may benefit from higher intake of Vitamin D.  If you are among these women, your doctor will have told you during your visit.     PAP SMEARS:  Pap smears, to check for cervical cancer or precancers,  have traditionally been done yearly, although recent scientific advances have shown that most women can have pap smears less often.  However, every woman still should have a physical exam from her gynecologist or primary care physician every year. It will include a breast check, inspection of the vulva and vagina to check for abnormal growths or skin changes, a visual exam of the cervix, and then an exam to evaluate the size and shape of the uterus and ovaries.  And after 56 years of age, a rectal exam is indicated to check for rectal cancers. We will also provide age appropriate advice regarding health maintenance, like when you should have certain vaccines, screening for sexually transmitted diseases, bone density testing, colonoscopy, mammograms, etc.    MAMMOGRAMS:  All women over 74 years old should have a yearly mammogram. Many facilities now offer a "3D" mammogram, which may cost  around $50 extra out of pocket. If possible,  we recommend you accept the option to have the 3D mammogram performed.  It both reduces the number of women who will be called back for extra views which then turn out to be normal, and it is better than the routine mammogram at detecting truly abnormal areas.     COLONOSCOPY:  Colonoscopy to screen for colon cancer is recommended for all women at age 43.  We know, you hate the idea of the prep.  We agree, BUT, having colon cancer and not knowing it is worse!!  Colon cancer so often starts as a polyp that can be seen and removed at colonscopy, which can quite literally save your life!  And if your first colonoscopy is normal and you have no family history of colon cancer, most women don't have to have it again for 10 years.  Once every ten years, you can do something that may end up saving your life, right?  We will be happy to help you get it scheduled when you are ready.  Be sure to check your insurance coverage so you understand how much it will cost.  It may be covered as a preventative service at no cost, but you should check your particular policy.    Continue all medications as directions. Increase water intake, strive for at least 68 ounces/day.   Follow Heart Healthy diet Increase regular walking. Recommend your re-establish with local GI specialist, please call if you need a referral. Recommend Cardiac Stress test, re: fatigue. Please schedule fasting labs this Spring. Recommend  Regular follow-up in 3 months to discuss chronic conditions. NICE TO SEE YOU!

## 2017-08-11 NOTE — Assessment & Plan Note (Signed)
Continue HCTZ 50mg  QD Needs CMP

## 2017-08-11 NOTE — Assessment & Plan Note (Addendum)
Advised to f/u with Rheumatologist Not on any RA meds currently

## 2017-08-11 NOTE — Assessment & Plan Note (Signed)
Steadily increasing > 6 months Needs TSH drawn Recommend Myoview Stress test- she declined due to lack of health insurance

## 2017-08-11 NOTE — Telephone Encounter (Signed)
Patient called states she thought Valetta Fuller was going to increase her :  DULoxetine (CYMBALTA) 60 MG capsule [[id number]]   Order Details  Dose: 60 mg Route: Oral Frequency: Daily  Dispense Quantity: 90 capsule Refills: 0 Fills remaining: --        Sig: Take 1 capsule (60 mg total) by mouth daily     -- spoke w/ provider she states changed her mind after reviewing patient meds.  --forwarding message to medical assistant to contact patient regarding status of no (Change).  --glh

## 2017-08-11 NOTE — Assessment & Plan Note (Signed)
BP at goal 111/75, HR 98 Currently on Lisinopril 40mg  QD and HCTZ 50 mg QD

## 2017-08-11 NOTE — Assessment & Plan Note (Signed)
Currently on Duloxetine 60mg  QD Recommend increasing daily walking/stretching before increasing rx- studies have shown little sx control wit duloxetine doses > 60mg  QD

## 2017-08-11 NOTE — Assessment & Plan Note (Signed)
Advised to be seen by GI and reduce spicy/acidic foods in diet. Advised not to eat too closely to HS Currently on Omeprazole 20mg  BID

## 2017-08-12 NOTE — Telephone Encounter (Signed)
LVM for pt to call to discuss.  T. Nelson, CMA  

## 2017-08-12 NOTE — Telephone Encounter (Signed)
Pt informed that [name] does not want to increase Cymbalta dosage d/t studies showing that there is no increased symptom control with increased dosages, per Katy's note on 08/11/17.  Pt expressed understanding and is agreeable.  Charyl Bigger, CMA

## 2017-08-19 ENCOUNTER — Other Ambulatory Visit: Payer: Self-pay | Admitting: Adult Health

## 2017-08-19 ENCOUNTER — Telehealth: Payer: Self-pay | Admitting: Adult Health

## 2017-08-19 DIAGNOSIS — I10 Essential (primary) hypertension: Secondary | ICD-10-CM

## 2017-08-19 NOTE — Telephone Encounter (Signed)
Done.  T. Nelson, CMA °

## 2017-08-19 NOTE — Telephone Encounter (Signed)
Patient is requesting a refill of her hydrochlorothiazide, if approved please send to [facility] on [address]   °

## 2017-08-21 ENCOUNTER — Other Ambulatory Visit: Payer: Self-pay | Admitting: Adult Health

## 2017-08-21 ENCOUNTER — Telehealth: Payer: Self-pay | Admitting: Adult Health

## 2017-08-21 DIAGNOSIS — I10 Essential (primary) hypertension: Secondary | ICD-10-CM

## 2017-08-21 NOTE — Telephone Encounter (Signed)
Patient is requesting a refill of her lisinopril, if approved please send to [facility] on [address].  °

## 2017-08-24 NOTE — Telephone Encounter (Signed)
Done.  T. Nelson, CMA °

## 2017-09-02 ENCOUNTER — Ambulatory Visit (INDEPENDENT_AMBULATORY_CARE_PROVIDER_SITE_OTHER): Payer: Self-pay

## 2017-09-02 ENCOUNTER — Ambulatory Visit (INDEPENDENT_AMBULATORY_CARE_PROVIDER_SITE_OTHER): Payer: Self-pay | Admitting: Physician Assistant

## 2017-09-02 ENCOUNTER — Encounter (INDEPENDENT_AMBULATORY_CARE_PROVIDER_SITE_OTHER): Payer: Self-pay | Admitting: Physician Assistant

## 2017-09-02 DIAGNOSIS — M25511 Pain in right shoulder: Secondary | ICD-10-CM

## 2017-09-02 DIAGNOSIS — G8929 Other chronic pain: Secondary | ICD-10-CM

## 2017-09-02 MED ORDER — PREDNISONE 10 MG (21) PO TBPK: ORAL_TABLET | ORAL | 0 refills | Status: DC

## 2017-09-02 NOTE — Progress Notes (Signed)
Office Visit Note   Patient: [name]           Date of Birth: 1961/01/22           MRN: [id number] Visit Date: 09/02/2017              Requested by: Esaw Grandchild, NP [place], [place] [address] PCP: Esaw Grandchild, NP   Assessment & Plan: Visit Diagnoses:  1. Chronic right shoulder pain     Plan: Impression is right shoulder rotator cuff tendinitis versus re-tear of the rotator cuff.  Today, I discussed with the patient the likelihood that a cortisone injection may help her pain.  She does not want to proceed with this.  We will send in a Sterapred taper.  Once she obtains insurance, she will call and let us know will be an MR arthrogram to assess her right rotator cuff.  She will call if concerns or questions in the meantime.  I have given her a small prescription of Norco that will not be refilled.  She understands and agrees.  Follow-Up Instructions: Return if symptoms worsen or fail to improve.   Orders:  Orders Placed This Encounter  Procedures  . XR Shoulder Right   Meds ordered this encounter  Medications  . predniSONE (STERAPRED UNI-PAK 21 TAB) 10 MG (21) TBPK tablet    Sig: Take as directed    Dispense:  21 tablet    Refill:  0      Procedures: No procedures performed   Clinical Data: No additional findings.   Subjective: Chief Complaint  Patient presents with  . Right Shoulder - Pain    HPI patient is a pleasant 57 year old female who presents to our clinic today with recurrent right shoulder pain.  She is status post right shoulder rotator cuff repair, date of surgery 05/02/2016.  She was initially doing okay after surgery until several months ago when she tripped falling on a door frame landing on her right shoulder.  Since then she has had marked pain which is progressively worsened.  Her pain is to the deltoid and is worse with any motion of the shoulder.  She is right-handed and waits tables for living which is really been  impacted by this injury.  She has not had insurance and this is the reason why she has not been seen.  She has been taking over-the-counter medications without relief of symptoms.  No cortisone injection prior to her following surgical intervention as she says these absolutely do not work for her.   Review of Systems  Constitutional: Negative.   HENT: Negative.   Eyes: Negative.   Respiratory: Negative.   Cardiovascular: Negative.   Endocrine: Negative.   Musculoskeletal: Negative.   Neurological: Negative.   Hematological: Negative.   Psychiatric/Behavioral: Negative.   All other systems reviewed and are negative.  as detailed in HPI.  All others reviewed and are negative.   Objective: Vital Signs: LMP 02/07/2013   Physical Exam well-developed well-nourished female no acute distress.  Alert and oriented x3.  Ortho Exam examination of her right shoulder reveals 50% active range of motion in all planes.  Markedly positive empty can.    Specialty Comments:  No specialty comments available.  Imaging: Xr Shoulder Right  Result Date: 09/02/2017 X-rays are negative for acute findings.  Negative for superior migration of the humeral head    PMFS History: Patient Active Problem List   Diagnosis Date Noted  . Chronic  right shoulder pain 09/02/2017  . Fibromyalgia 08/11/2017  . Family history of diabetes mellitus 06/23/2016  . Other fatigue 06/23/2016  . Insomnia 06/23/2016  . Rheumatoid arthritis ([place]) 06/23/2016  . Nontraumatic incomplete tear of right rotator cuff 04/28/2016  . Impingement syndrome of right shoulder 03/31/2016  . Status post right partial knee replacement 09/15/2014  . Acute medial meniscal injury of knee 06/14/2014  . Status post total knee replacement using cement 06/08/2014  . Primary osteoarthritis of right knee 06/08/2014  . Screening for colon cancer   . Schatzki's ring   . Hiatal hernia   . Dysphagia, pharyngoesophageal phase 05/26/2014  .  Encounter for screening colonoscopy 05/26/2014  . Adrenal adenoma 05/01/2014  . Breast lump 05/01/2014  . Metatarsalgia of both feet 01/12/2014  . Bilateral leg pain 01/11/2014  . Bilateral edema of lower extremity 01/11/2014  . Other osteoarthritis of spine, thoracolumbar region 12/26/2013  . Hypercholesteremia 05/19/2013  . Periodic health assessment, general screening, adult 04/13/2013  . GERD (gastroesophageal reflux disease) 04/13/2013  . HTN (hypertension) 04/13/2013  . Dyspnea 04/13/2013   Past Medical History:  Diagnosis Date  . Acid reflux    takes Zantac and Omeprazole daily  . Anxiety    takes Citaopram daily  . Arthritis    right knee  . Chronic back pain    DDD  . Clotting disorder ([place])   . History of blood clots    40yrs ago and in left leg  . History of bronchitis 3+yrs ago  . History of migraine    has been a while since last one  . Hyperlipidemia    takes Atorvastatin daily  . Hypertension    takes Lisinopril and HCTZ daily  . Insomnia    takes Elavil nightly as needed  . Joint pain   . Joint swelling   . Paraesophageal hernia   . Weakness    numbness and tingling in both feet    Family History  Problem Relation Age of Onset  . Cancer Father        lung  . Cancer Sister        breast  . Breast cancer Sister 14  . Cancer Maternal Grandfather        lung  . Cancer Paternal Grandfather        lung  . Diabetes Son   . Colon cancer Neg Hx     Past Surgical History:  Procedure Laterality Date  . BREAST BIOPSY Left   . CHONDROPLASTY Right 06/28/2014   Procedure: CHONDROPLASTY;  Surgeon: Marianna Payment, MD;  Location: [facility];  Service: Orthopedics;  Laterality: Right;  . COLONOSCOPY N/A 05/29/2014   Procedure: COLONOSCOPY;  Surgeon: Daneil Dolin, MD;  [facility];  Service: Endoscopy;  Laterality: N/A;  215pm- Pt is working until 12:00 so she can't come any earlier  . ESOPHAGOGASTRODUODENOSCOPY N/A 05/29/2014    Procedure: ESOPHAGOGASTRODUODENOSCOPY (EGD);  Surgeon: Daneil Dolin, MD;  [facility];  Service: Endoscopy;  Laterality: N/A;  . GANGLION CYST EXCISION Left 01/13/2002  . KNEE ARTHROSCOPY WITH MEDIAL MENISECTOMY Right 06/28/2014   Procedure: RIGHT KNEE ARTHROSCOPY WITH PARTIAL MEDIAL MENISCECTOMY AND CHONDROPLASTY;  Surgeon: Marianna Payment, MD;  Location: [facility];  Service: Orthopedics;  Laterality: Right;  . MALONEY DILATION N/A 05/29/2014   Procedure: Venia Minks DILATION;  Surgeon: Daneil Dolin, MD;  [facility];  Service: Endoscopy;  Laterality: N/A;  . PARTIAL KNEE ARTHROPLASTY  Right 09/15/2014   Procedure: RIGHT UNICOMPARTMENTAL KNEE ARTHROPLASTY;  Surgeon: Leandrew Koyanagi, MD;  Location: [facility];  Service: Orthopedics;  Laterality: Right;  . TOTAL KNEE ARTHROPLASTY Left 04/08/2004   Social History   Occupational History  . Not on file  Tobacco Use  . Smoking status: Never Smoker  . Smokeless tobacco: Never Used  Substance and Sexual Activity  . Alcohol use: No    Alcohol/week: 0.0 oz  . Drug use: No  . Sexual activity: Not Currently    Birth control/protection: None

## 2017-09-03 ENCOUNTER — Other Ambulatory Visit (INDEPENDENT_AMBULATORY_CARE_PROVIDER_SITE_OTHER): Payer: Self-pay

## 2017-09-03 ENCOUNTER — Encounter (HOSPITAL_COMMUNITY): Payer: Self-pay | Admitting: *Deleted

## 2017-09-03 ENCOUNTER — Telehealth (INDEPENDENT_AMBULATORY_CARE_PROVIDER_SITE_OTHER): Payer: Self-pay | Admitting: Orthopaedic Surgery

## 2017-09-03 MED ORDER — PREDNISONE 10 MG (21) PO TBPK: ORAL_TABLET | ORAL | 0 refills | Status: DC

## 2017-09-03 NOTE — Telephone Encounter (Signed)
Sent Rx to the [facility] on [address]

## 2017-09-03 NOTE — Telephone Encounter (Signed)
Patient called stating that Mendel Ryder said she would send in a steroid for her to try out for 6 days, she contacted her pharmacy and they said they never got the RX. If it could be sent int again. Thank you. CB # [phone]

## 2017-09-03 NOTE — Progress Notes (Signed)
Letter mailed to patient with negative pap smear results. HPV was negative. Next pap smear due in five years. 

## 2017-09-04 ENCOUNTER — Other Ambulatory Visit (INDEPENDENT_AMBULATORY_CARE_PROVIDER_SITE_OTHER): Payer: Self-pay | Admitting: Physician Assistant

## 2017-09-04 MED ORDER — PREDNISONE 10 MG (21) PO TBPK: ORAL_TABLET | ORAL | 0 refills | Status: DC

## 2017-09-22 ENCOUNTER — Telehealth: Payer: Self-pay | Admitting: Adult Health

## 2017-09-22 MED ORDER — ATORVASTATIN CALCIUM 10 MG PO TABS: 10.0000 mg | ORAL_TABLET | Freq: Every day | ORAL | 0 refills | Status: DC

## 2017-09-22 NOTE — Addendum Note (Signed)
Addended by: Fonnie Mu on: 09/22/2017 11:41 AM   Modules accepted: Orders

## 2017-09-22 NOTE — Telephone Encounter (Signed)
Patient is requesting a refill of her atorvastatin, if approved please send to [facility] on [address]

## 2017-09-30 ENCOUNTER — Ambulatory Visit: Payer: Self-pay | Attending: Family Medicine

## 2017-10-08 ENCOUNTER — Telehealth: Payer: Self-pay | Admitting: Adult Health

## 2017-10-08 MED ORDER — DULOXETINE HCL 60 MG PO CPEP: 60.0000 mg | ORAL_CAPSULE | Freq: Every day | ORAL | 0 refills | Status: DC

## 2017-10-08 NOTE — Telephone Encounter (Signed)
Patient came by office to request refill on:    (CYMBALTA) 60 MG capsule [[id number]]   Order Details  Dose: 60 mg Route: Oral Frequency: Daily  Dispense Quantity: 90 capsule Refills: 0 Fills remaining: --        Sig: Take 1 capsule (60 mg total) by mouth daily.          Please send to :  [facility] ([address] [place] [phone] (Phone) [phone] (Fax)    --Forwarding message to medical assistant.  --Dion Body

## 2017-11-16 ENCOUNTER — Ambulatory Visit (INDEPENDENT_AMBULATORY_CARE_PROVIDER_SITE_OTHER): Payer: Self-pay | Admitting: Adult Health

## 2017-11-16 ENCOUNTER — Encounter: Payer: Self-pay | Admitting: Adult Health

## 2017-11-16 VITALS — BP 116/77 | HR 78 | Ht 64.0 in | Wt 211.8 lb

## 2017-11-16 DIAGNOSIS — B360 Pityriasis versicolor: Secondary | ICD-10-CM | POA: Insufficient documentation

## 2017-11-16 DIAGNOSIS — Z0000 Encounter for general adult medical examination without abnormal findings: Secondary | ICD-10-CM | POA: Insufficient documentation

## 2017-11-16 DIAGNOSIS — I10 Essential (primary) hypertension: Secondary | ICD-10-CM

## 2017-11-16 MED ORDER — KETOCONAZOLE 2 % EX SHAM: 1.0000 "application " | MEDICATED_SHAMPOO | CUTANEOUS | 0 refills | Status: DC

## 2017-11-16 MED ORDER — HYDROCHLOROTHIAZIDE 50 MG PO TABS: 50.0000 mg | ORAL_TABLET | Freq: Every day | ORAL | 2 refills | Status: DC

## 2017-11-16 NOTE — Progress Notes (Signed)
Subjective:    Patient ID: [name], female    DOB: 1960-05-31, 57 y.o.   MRN: [id number]  HPI: 01/08/17 OV: [name] is here for f/u: HTN, depression, GERD,insomnia, HLD, and fatigue. She has been compliant on all medications and denies SE.  She has been steadily increasing water and been consuming less caffeine.  She feels that her sleep has improved and has noticed a slight decline in overall fatigue. She was seen/evaluated by podiatry and a surgical plan was agreed upon. She feels that her mood is stable and denies thoughts of harming herself/others and has really enjoyed her new job at [employer].  She continues to avoid EOTH/tobacco.  08/11/17 OV: [name] is here for CPE. She cancelled podiatry surgery due to financial hardships. She reports increased fatigue, that has been worsening > 6 months. She has not seen her Rheumatologist for her RA and also stopped seeing GI specialist due to lack of medical insurance. If she does not take Omeprazole 20mg  BID her GERD "it out of control bad". She feels that her fibromyalgia is not improving.  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  She does not exercise and eats a diet high in CHO. She hydrates with soda pop and drinks very little plain water. She denies tobacco/ETOH use. Healthcare Maintenance: PAP- completed 3/19  Mammogram- completed 3/19 Colonoscopy- due 2026  11/16/17 OV: [name] presents for regular f/u: HTN, depression, GERD, insomnia, HLD, and fibromyalgia She recently quit her high stress job and reports sig reduction in anxiety. She continue to hydrate only with soda or coffee, denies plain water intake. She denies regular exercise, however performs yard/house work daily. She reports itching underneath her breasts that has been steadily worsening for the last few months.  She denies recent travel outside [place], change in laundry detergent, or recently starting taking any new  medications. She reports reduction in overall fibromyalgia sx's  Patient Care Team    Relationship Specialty Notifications Start End  Mina Marble D, NP PCP - General Family Medicine  06/23/16   Daneil Dolin, MD Consulting Physician Gastroenterology  03/08/14   Leandrew Koyanagi, MD Attending Physician Orthopedic Surgery  06/23/16   Hurley Cisco, MD Consulting Physician Rheumatology  06/23/16     Patient Active Problem List   Diagnosis Date Noted  . Healthcare maintenance 11/16/2017  . Tinea versicolor 11/16/2017  . Chronic right shoulder pain 09/02/2017  . Fibromyalgia 08/11/2017  . Family history of diabetes mellitus 06/23/2016  . Other fatigue 06/23/2016  . Insomnia 06/23/2016  . Rheumatoid arthritis ([place]) 06/23/2016  . Nontraumatic incomplete tear of right rotator cuff 04/28/2016  . Impingement syndrome of right shoulder 03/31/2016  . Status post right partial knee replacement 09/15/2014  . Acute medial meniscal injury of knee 06/14/2014  . Status post total knee replacement using cement 06/08/2014  . Primary osteoarthritis of right knee 06/08/2014  . Screening for colon cancer   . Schatzki's ring   . Hiatal hernia   . Dysphagia, pharyngoesophageal phase 05/26/2014  . Encounter for screening colonoscopy 05/26/2014  . Adrenal adenoma 05/01/2014  . Breast lump 05/01/2014  . Metatarsalgia of both feet 01/12/2014  . Bilateral leg pain 01/11/2014  . Bilateral edema of lower extremity 01/11/2014  . Other osteoarthritis of spine, thoracolumbar region 12/26/2013  . Hypercholesteremia 05/19/2013  . Periodic health assessment, general screening, adult 04/13/2013  . GERD (gastroesophageal reflux disease) 04/13/2013  . HTN (hypertension)  04/13/2013  . Dyspnea 04/13/2013     Past Medical History:  Diagnosis Date  . Acid reflux    takes Zantac and Omeprazole daily  . Anxiety    takes Citaopram daily  . Arthritis    right knee  . Chronic back pain    DDD  . Clotting  disorder ([place])   . History of blood clots    36yrs ago and in left leg  . History of bronchitis 3+yrs ago  . History of migraine    has been a while since last one  . Hyperlipidemia    takes Atorvastatin daily  . Hypertension    takes Lisinopril and HCTZ daily  . Insomnia    takes Elavil nightly as needed  . Joint pain   . Joint swelling   . Paraesophageal hernia   . Weakness    numbness and tingling in both feet     Past Surgical History:  Procedure Laterality Date  . BREAST BIOPSY Left   . CHONDROPLASTY Right 06/28/2014   Procedure: CHONDROPLASTY;  Surgeon: Marianna Payment, MD;  Location: [place];  Service: Orthopedics;  Laterality: Right;  . COLONOSCOPY N/A 05/29/2014   Procedure: COLONOSCOPY;  Surgeon: Daneil Dolin, MD;  [facility];  Service: Endoscopy;  Laterality: N/A;  215pm- Pt is working until 12:00 so she can't come any earlier  . ESOPHAGOGASTRODUODENOSCOPY N/A 05/29/2014   Procedure: ESOPHAGOGASTRODUODENOSCOPY (EGD);  Surgeon: Daneil Dolin, MD;  [facility];  Service: Endoscopy;  Laterality: N/A;  . GANGLION CYST EXCISION Left 01/13/2002  . KNEE ARTHROSCOPY WITH MEDIAL MENISECTOMY Right 06/28/2014   Procedure: RIGHT KNEE ARTHROSCOPY WITH PARTIAL MEDIAL MENISCECTOMY AND CHONDROPLASTY;  Surgeon: Marianna Payment, MD;  Location: [place];  Service: Orthopedics;  Laterality: Right;  . MALONEY DILATION N/A 05/29/2014   Procedure: Venia Minks DILATION;  Surgeon: Daneil Dolin, MD;  [facility];  Service: Endoscopy;  Laterality: N/A;  . PARTIAL KNEE ARTHROPLASTY Right 09/15/2014   Procedure: RIGHT UNICOMPARTMENTAL KNEE ARTHROPLASTY;  Surgeon: Leandrew Koyanagi, MD;  Location: [place];  Service: Orthopedics;  Laterality: Right;  . TOTAL KNEE ARTHROPLASTY Left 04/08/2004     Family History  Problem Relation Age of Onset  . Cancer Father        lung  . Cancer Sister        breast  . Breast cancer Sister 6   . Cancer Maternal Grandfather        lung  . Cancer Paternal Grandfather        lung  . Diabetes Son   . Colon cancer Neg Hx      Social History   Substance and Sexual Activity  Drug Use No     Social History   Substance and Sexual Activity  Alcohol Use No  . Alcohol/week: 0.0 oz     Social History   Tobacco Use  Smoking Status Never Smoker  Smokeless Tobacco Never Used     Outpatient Encounter Medications as of 11/16/2017  Medication Sig  . atorvastatin (LIPITOR) 10 MG tablet Take 1 tablet (10 mg total) by mouth daily.  . DULoxetine (CYMBALTA) 60 MG capsule Take 1 capsule (60 mg total) by mouth daily.  . hydrochlorothiazide (HYDRODIURIL) 50 MG tablet Take 1 tablet (50 mg total) by mouth daily.  Marland Kitchen lisinopril (PRINIVIL,ZESTRIL) 40 MG tablet TAKE 1 TABLET BY MOUTH ONCE DAILY  . omeprazole (PRILOSEC) 20 MG capsule Take 1 capsule (20  mg total) by mouth 2 (two) times daily before a meal.  . [DISCONTINUED] hydrochlorothiazide (HYDRODIURIL) 50 MG tablet TAKE 1 TABLET BY MOUTH ONCE DAILY  . ketoconazole (NIZORAL) 2 % shampoo Apply 1 application topically 2 (two) times a week.  . [DISCONTINUED] ibuprofen (ADVIL,MOTRIN) 600 MG tablet Take 1 tablet (600 mg total) by mouth every 8 (eight) hours as needed.  . [DISCONTINUED] methocarbamol (ROBAXIN) 500 MG tablet Take 1 tablet (500 mg total) by mouth every 6 (six) hours as needed for muscle spasms.  . [DISCONTINUED] predniSONE (STERAPRED UNI-PAK 21 TAB) 10 MG (21) TBPK tablet Take as directed   No facility-administered encounter medications on file as of 11/16/2017.     Allergies: Tramadol  Body mass index is 36.36 kg/m.  Blood pressure 116/77, pulse 78, height 5\' 4"  (1.626 m), weight 211 lb 12.8 oz (96.1 kg), last menstrual period 02/07/2013, SpO2 98 %.  Review of Systems  Constitutional: Negative for activity change, appetite change, chills, diaphoresis, fatigue, fever and unexpected weight change.  HENT: Negative for  congestion.   Eyes: Negative for visual disturbance.  Respiratory: Negative for cough, chest tightness, shortness of breath, wheezing and stridor.   Cardiovascular: Negative for chest pain, palpitations and leg swelling.  Gastrointestinal: Negative for abdominal distention, abdominal pain, blood in stool, constipation, diarrhea, nausea and vomiting.  Endocrine: Negative for cold intolerance, heat intolerance, polydipsia, polyphagia and polyuria.  Genitourinary: Negative for difficulty urinating and flank pain.  Musculoskeletal: Negative for arthralgias, back pain, gait problem, joint swelling, myalgias, neck pain and neck stiffness.  Skin: Positive for rash. Negative for color change, pallor and wound.  Neurological: Negative for dizziness and headaches.  Hematological: Does not bruise/bleed easily.  Psychiatric/Behavioral: Positive for sleep disturbance. Negative for decreased concentration, dysphoric mood, hallucinations, self-injury and suicidal ideas. The patient is not nervous/anxious and is not hyperactive.       Objective:   Physical Exam  Constitutional: She is oriented to person, place, and time. She appears well-developed and well-nourished. No distress.  HENT:  Head: Normocephalic and atraumatic.  Right Ear: Tympanic membrane, external ear and ear canal normal. Tympanic membrane is not erythematous and not bulging. No decreased hearing is noted.  Left Ear: Hearing, tympanic membrane, external ear and ear canal normal. Tympanic membrane is not erythematous and not bulging. No decreased hearing is noted.  Nose: Nose normal. No mucosal edema or rhinorrhea. Right sinus exhibits no frontal sinus tenderness. Left sinus exhibits no maxillary sinus tenderness and no frontal sinus tenderness.  Mouth/Throat: Oropharynx is clear and moist and mucous membranes are normal. Abnormal dentition. No oropharyngeal exudate, posterior oropharyngeal edema, posterior oropharyngeal erythema or tonsillar  abscesses.  Eyes: Pupils are equal, round, and reactive to light. Conjunctivae are normal.  Neck: Normal range of motion. Neck supple.  Cardiovascular: Normal rate, regular rhythm, normal heart sounds and intact distal pulses.  No murmur heard. Pulmonary/Chest: Effort normal and breath sounds normal. No respiratory distress. She has no wheezes. She has no rales. She exhibits no tenderness.  Abdominal: There is no guarding.  Lymphadenopathy:    She has no cervical adenopathy.  Neurological: She is alert and oriented to person, place, and time.  Skin: Skin is warm, dry and intact. Rash noted. Rash is vesicular. She is not diaphoretic. No erythema. No pallor.  Vesicular rash noted underneath both breasts  Psychiatric: She has a normal mood and affect. Her behavior is normal. Judgment and thought content normal.  Nursing note and vitals reviewed.     Assessment &  Plan:   1. Healthcare maintenance   2. Essential hypertension   3. Tinea versicolor     Healthcare maintenance Increase water intake, strive for at least 85 ounces/day.   Follow Heart Healthy diet Increase regular exercise.  Recommend at least 30 minutes daily, 5 days per week of walking, jogging, biking, swimming, YouTube/Pinterest workout videos. Please continue all medications as directed. Please use Ketoconazole 2% shampoo as directed. Good luck with the new employment opportunities. Please follow-up in 3 months, re: fasting labs and complete physical.  HTN (hypertension) BP at goal 116/77, HR 78 Please continue Lisinopril 40mg  QD and HCTZ 50mg  QD   Tinea versicolor Remove moist clothing as soon as possible Please use Ketoconazole 2% shampoo twice weekly  FOLLOW-UP:  Return in about 3 months (around 02/16/2018) for CPE, Fasting Labs.

## 2017-11-16 NOTE — Assessment & Plan Note (Signed)
BP at goal 116/77, HR 78 Please continue Lisinopril 40mg  QD and HCTZ 50mg  QD

## 2017-11-16 NOTE — Patient Instructions (Addendum)
Mediterranean Diet A Mediterranean diet refers to food and lifestyle choices that are based on the traditions of countries located on the [place]. This way of eating has been shown to help prevent certain conditions and improve outcomes for people who have chronic diseases, like kidney disease and heart disease. What are tips for following this plan? Lifestyle  Cook and eat meals together with your family, when possible.  Drink enough fluid to keep your urine clear or pale yellow.  Be physically active every day. This includes: ? Aerobic exercise like running or swimming. ? Leisure activities like gardening, walking, or housework.  Get 7-8 hours of sleep each night.  If recommended by your health care provider, drink red wine in moderation. This means 1 glass a day for nonpregnant women and 2 glasses a day for men. A glass of wine equals 5 oz (150 mL). Reading food labels  Check the serving size of packaged foods. For foods such as rice and pasta, the serving size refers to the amount of cooked product, not dry.  Check the total fat in packaged foods. Avoid foods that have saturated fat or trans fats.  Check the ingredients list for added sugars, such as corn syrup. Shopping  At the grocery store, buy most of your food from the areas near the walls of the store. This includes: ? Fresh fruits and vegetables (produce). ? Grains, beans, nuts, and seeds. Some of these may be available in unpackaged forms or large amounts (in bulk). ? Fresh seafood. ? Poultry and eggs. ? Low-fat dairy products.  Buy whole ingredients instead of prepackaged foods.  Buy fresh fruits and vegetables in-season from local farmers markets.  Buy frozen fruits and vegetables in resealable bags.  If you do not have access to quality fresh seafood, buy precooked frozen shrimp or canned fish, such as tuna, salmon, or sardines.  Buy small amounts of raw or cooked vegetables, salads, or olives from the  deli or salad bar at your store.  Stock your pantry so you always have certain foods on hand, such as olive oil, canned tuna, canned tomatoes, rice, pasta, and beans. Cooking  Cook foods with extra-virgin olive oil instead of using butter or other vegetable oils.  Have meat as a side dish, and have vegetables or grains as your main dish. This means having meat in small portions or adding small amounts of meat to foods like pasta or stew.  Use beans or vegetables instead of meat in common dishes like chili or lasagna.  Experiment with different cooking methods. Try roasting or broiling vegetables instead of steaming or sauteing them.  Add frozen vegetables to soups, stews, pasta, or rice.  Add nuts or seeds for added healthy fat at each meal. You can add these to yogurt, salads, or vegetable dishes.  Marinate fish or vegetables using olive oil, lemon juice, garlic, and fresh herbs. Meal planning  Plan to eat 1 vegetarian meal one day each week. Try to work up to 2 vegetarian meals, if possible.  Eat seafood 2 or more times a week.  Have healthy snacks readily available, such as: ? Vegetable sticks with hummus. ? Greek yogurt. ? Fruit and nut trail mix.  Eat balanced meals throughout the week. This includes: ? Fruit: 2-3 servings a day ? Vegetables: 4-5 servings a day ? Low-fat dairy: 2 servings a day ? Fish, poultry, or lean meat: 1 serving a day ? Beans and legumes: 2 or more servings a week ? Nuts   and seeds: 1-2 servings a day ? Whole grains: 6-8 servings a day ? Extra-virgin olive oil: 3-4 servings a day  Limit red meat and sweets to only a few servings a month What are my food choices?  Mediterranean diet ? Recommended ? Grains: Whole-grain pasta. Brown rice. Bulgar wheat. Polenta. Couscous. Whole-wheat bread. [place]. ? Vegetables: Artichokes. Beets. Broccoli. Cabbage. Carrots. Eggplant. Green beans. Chard. Kale. Spinach. Onions. Leeks. Peas. Squash.  Tomatoes. Peppers. Radishes. ? Fruits: Apples. Apricots. Avocado. Berries. Bananas. Cherries. Dates. Figs. Grapes. Lemons. Melon. Oranges. Peaches. Plums. Pomegranate. ? Meats and other protein foods: Beans. Almonds. Sunflower seeds. Pine nuts. Peanuts. [place]. Salmon. Scallops. Shrimp. [place]. Tilapia. Clams. Oysters. Eggs. ? Dairy: Low-fat milk. Cheese. Greek yogurt. ? Beverages: Water. Red wine. Herbal tea. ? Fats and oils: Extra virgin olive oil. Avocado oil. Grape seed oil. ? Sweets and desserts: [place] yogurt with honey. Baked apples. Poached pears. Trail mix. ? Seasoning and other foods: Basil. Cilantro. Coriander. Cumin. Mint. Parsley. Sage. Rosemary. Tarragon. Garlic. Oregano. Thyme. Pepper. Balsalmic vinegar. Tahini. Hummus. Tomato sauce. Olives. Mushrooms. ? Limit these ? Grains: Prepackaged pasta or rice dishes. Prepackaged cereal with added sugar. ? Vegetables: Deep fried potatoes (french fries). ? Fruits: Fruit canned in syrup. ? Meats and other protein foods: Beef. Pork. Lamb. Poultry with skin. Hot dogs. [place] Salines. ? Dairy: Ice cream. Sour cream. Whole milk. ? Beverages: Juice. Sugar-sweetened soft drinks. Beer. Liquor and spirits. ? Fats and oils: Butter. Canola oil. Vegetable oil. Beef fat (tallow). Lard. ? Sweets and desserts: Cookies. Cakes. Pies. Candy. ? Seasoning and other foods: Mayonnaise. Premade sauces and marinades. ? The items listed may not be a complete list. Talk with your dietitian about what dietary choices are right for you. Summary  The Mediterranean diet includes both food and lifestyle choices.  Eat a variety of fresh fruits and vegetables, beans, nuts, seeds, and whole grains.  Limit the amount of red meat and sweets that you eat.  Talk with your health care provider about whether it is safe for you to drink red wine in moderation. This means 1 glass a day for nonpregnant women and 2 glasses a day for men. A glass of wine equals 5 oz (150 mL). This information  is not intended to replace advice given to you by your health care provider. Make sure you discuss any questions you have with your health care provider. Document Released: 12/20/2015 Document Revised: 01/22/2016 Document Reviewed: 12/20/2015 Elsevier Interactive Patient Education  2018 [employer].    Hypertension Hypertension, commonly called high blood pressure, is when the force of blood pumping through the arteries is too strong. The arteries are the blood vessels that carry blood from the heart throughout the body. Hypertension forces the heart to work harder to pump blood and may cause arteries to become narrow or stiff. Having untreated or uncontrolled hypertension can cause heart attacks, strokes, kidney disease, and other problems. A blood pressure reading consists of a higher number over a lower number. Ideally, your blood pressure should be below 120/80. The first ("top") number is called the systolic pressure. It is a measure of the pressure in your arteries as your heart beats. The second ("bottom") number is called the diastolic pressure. It is a measure of the pressure in your arteries as the heart relaxes. What are the causes? The cause of this condition is not known. What increases the risk? Some risk factors for high blood pressure are under your control. Others are not. Factors you  can change  Smoking.  Having type 2 diabetes mellitus, high cholesterol, or both.  Not getting enough exercise or physical activity.  Being overweight.  Having too much fat, sugar, calories, or salt (sodium) in your diet.  Drinking too much alcohol. Factors that are difficult or impossible to change  Having chronic kidney disease.  Having a family history of high blood pressure.  Age. Risk increases with age.  Race. You may be at higher risk if you are African-American.  Gender. Men are at higher risk than women before age 19. After age 64, women are at higher risk than  men.  Having obstructive sleep apnea.  Stress. What are the signs or symptoms? Extremely high blood pressure (hypertensive crisis) may cause:  Headache.  Anxiety.  Shortness of breath.  Nosebleed.  Nausea and vomiting.  Severe chest pain.  Jerky movements you cannot control (seizures).  How is this diagnosed? This condition is diagnosed by measuring your blood pressure while you are seated, with your arm resting on a surface. The cuff of the blood pressure monitor will be placed directly against the skin of your upper arm at the level of your heart. It should be measured at least twice using the same arm. Certain conditions can cause a difference in blood pressure between your right and left arms. Certain factors can cause blood pressure readings to be lower or higher than normal (elevated) for a short period of time:  When your blood pressure is higher when you are in a health care provider's office than when you are at home, this is called white coat hypertension. Most people with this condition do not need medicines.  When your blood pressure is higher at home than when you are in a health care provider's office, this is called masked hypertension. Most people with this condition may need medicines to control blood pressure.  If you have a high blood pressure reading during one visit or you have normal blood pressure with other risk factors:  You may be asked to return on a different day to have your blood pressure checked again.  You may be asked to monitor your blood pressure at home for 1 week or longer.  If you are diagnosed with hypertension, you may have other blood or imaging tests to help your health care provider understand your overall risk for other conditions. How is this treated? This condition is treated by making healthy lifestyle changes, such as eating healthy foods, exercising more, and reducing your alcohol intake. Your health care provider may prescribe  medicine if lifestyle changes are not enough to get your blood pressure under control, and if:  Your systolic blood pressure is above 130.  Your diastolic blood pressure is above 80.  Your personal target blood pressure may vary depending on your medical conditions, your age, and other factors. Follow these instructions at home: Eating and drinking  Eat a diet that is high in fiber and potassium, and low in sodium, added sugar, and fat. An example eating plan is called the DASH (Dietary Approaches to Stop Hypertension) diet. To eat this way: ? Eat plenty of fresh fruits and vegetables. Try to fill half of your plate at each meal with fruits and vegetables. ? Eat whole grains, such as whole wheat pasta, brown rice, or whole grain bread. Fill about one quarter of your plate with whole grains. ? Eat or drink low-fat dairy products, such as skim milk or low-fat yogurt. ? Avoid fatty cuts of meat,  processed or cured meats, and poultry with skin. Fill about one quarter of your plate with lean proteins, such as fish, chicken without skin, beans, eggs, and tofu. ? Avoid premade and processed foods. These tend to be higher in sodium, added sugar, and fat.  Reduce your daily sodium intake. Most people with hypertension should eat less than 1,500 mg of sodium a day.  Limit alcohol intake to no more than 1 drink a day for nonpregnant women and 2 drinks a day for men. One drink equals 12 oz of beer, 5 oz of wine, or 1 oz of hard liquor. Lifestyle  Work with your health care provider to maintain a healthy body weight or to lose weight. Ask what an ideal weight is for you.  Get at least 30 minutes of exercise that causes your heart to beat faster (aerobic exercise) most days of the week. Activities may include walking, swimming, or biking.  Include exercise to strengthen your muscles (resistance exercise), such as pilates or lifting weights, as part of your weekly exercise routine. Try to do these types  of exercises for 30 minutes at least 3 days a week.  Do not use any products that contain nicotine or tobacco, such as cigarettes and e-cigarettes. If you need help quitting, ask your health care provider.  Monitor your blood pressure at home as told by your health care provider.  Keep all follow-up visits as told by your health care provider. This is important. Medicines  Take over-the-counter and prescription medicines only as told by your health care provider. Follow directions carefully. Blood pressure medicines must be taken as prescribed.  Do not skip doses of blood pressure medicine. Doing this puts you at risk for problems and can make the medicine less effective.  Ask your health care provider about side effects or reactions to medicines that you should watch for. Contact a health care provider if:  You think you are having a reaction to a medicine you are taking.  You have headaches that keep coming back (recurring).  You feel dizzy.  You have swelling in your ankles.  You have trouble with your vision. Get help right away if:  You develop a severe headache or confusion.  You have unusual weakness or numbness.  You feel faint.  You have severe pain in your chest or abdomen.  You vomit repeatedly.  You have trouble breathing. Summary  Hypertension is when the force of blood pumping through your arteries is too strong. If this condition is not controlled, it may put you at risk for serious complications.  Your personal target blood pressure may vary depending on your medical conditions, your age, and other factors. For most people, a normal blood pressure is less than 120/80.  Hypertension is treated with lifestyle changes, medicines, or a combination of both. Lifestyle changes include weight loss, eating a healthy, low-sodium diet, exercising more, and limiting alcohol. This information is not intended to replace advice given to you by your health care provider.  Make sure you discuss any questions you have with your health care provider. Document Released: 04/28/2005 Document Revised: 03/26/2016 Document Reviewed: 03/26/2016 Elsevier Interactive Patient Education  2018 [place] versicolor is a common fungal infection of the skin. It causes a rash that appears as light or dark patches on the skin. The rash most often occurs on the chest, back, neck, or upper arms. This condition is more common during warm weather. Other than affecting how your skin  looks, tinea versicolor usually does not cause other problems. In most cases, the infection goes away in a few weeks with treatment. It may take a few months for the patches on your skin to clear up. What are the causes? Tinea versicolor occurs when a type of fungus that is normally present on the skin starts to overgrow. This fungus is a kind of yeast. The exact cause of the overgrowth is not known. This condition cannot be passed from one person to another (noncontagious). What increases the risk? This condition is more likely to develop when certain factors are present, such as:  Heat and humidity.  Sweating too much.  Hormone changes.  Oily skin.  A weak defense (immune) system.  What are the signs or symptoms? Symptoms of this condition may include:  A rash on your skin that is made up of light or dark patches. The rash may have: ? Patches of tan or pink spots on light skin. ? Patches of white or brown spots on dark skin. ? Patches of skin that do not tan. ? Well-marked edges. ? Scales on the discolored areas.  Mild itching.  How is this diagnosed? A health care provider can usually diagnose this condition by looking at your skin. During the exam, he or she may use ultraviolet light to help determine the extent of the infection. In some cases, a skin sample may be taken by scraping the rash. This sample will be viewed under a microscope to check for yeast  overgrowth. How is this treated? Treatment for this condition may include:  Dandruff shampoo that is applied to the affected skin during showers or bathing.  Over-the-counter medicated skin cream, lotion, or soaps.  Prescription antifungal medicine in the form of skin cream or pills.  Medicine to help reduce itching.  Follow these instructions at home:  Take medicines only as directed by your health care provider.  Apply dandruff shampoo to the affected area if told to do so by your health care provider. You may be instructed to scrub the affected skin for several minutes each day.  Do not scratch the affected area of skin.  Avoid hot and humid conditions.  Do not use tanning booths.  Try to avoid sweating a lot. Contact a health care provider if:  Your symptoms get worse.  You have a fever.  You have redness, swelling, or pain at the site of your rash.  You have fluid, blood, or pus coming from your rash.  Your rash returns after treatment. This information is not intended to replace advice given to you by your health care provider. Make sure you discuss any questions you have with your health care provider. Document Released: 04/25/2000 Document Revised: 12/30/2015 Document Reviewed: 02/07/2014 Elsevier Interactive Patient Education  2018 [employer].  Increase water intake, strive for at least 85 ounces/day.   Follow Heart Healthy diet Increase regular exercise.  Recommend at least 30 minutes daily, 5 days per week of walking, jogging, biking, swimming, YouTube/Pinterest workout videos. Please continue all medications as directed. Please use Ketoconazole 2% shampoo as directed. Good luck with the new employment opportunities. Please follow-up in 3 months, re: fasting labs and complete physical. NICE TO SEE YOU!

## 2017-11-16 NOTE — Assessment & Plan Note (Signed)
Remove moist clothing as soon as possible Please use Ketoconazole 2% shampoo twice weekly

## 2017-11-16 NOTE — Assessment & Plan Note (Signed)
Increase water intake, strive for at least 85 ounces/day.   Follow Heart Healthy diet Increase regular exercise.  Recommend at least 30 minutes daily, 5 days per week of walking, jogging, biking, swimming, YouTube/Pinterest workout videos. Please continue all medications as directed. Please use Ketoconazole 2% shampoo as directed. Good luck with the new employment opportunities. Please follow-up in 3 months, re: fasting labs and complete physical.

## 2017-12-03 MED FILL — LISINOPRIL 40 MG TABLET: 40 | 30 days supply | Qty: 30 | Fill #0

## 2017-12-09 ENCOUNTER — Ambulatory Visit (INDEPENDENT_AMBULATORY_CARE_PROVIDER_SITE_OTHER): Payer: Self-pay

## 2017-12-09 ENCOUNTER — Ambulatory Visit (INDEPENDENT_AMBULATORY_CARE_PROVIDER_SITE_OTHER): Payer: No Typology Code available for payment source | Admitting: Podiatry

## 2017-12-09 DIAGNOSIS — M2042 Other hammer toe(s) (acquired), left foot: Secondary | ICD-10-CM

## 2017-12-09 DIAGNOSIS — M21611 Bunion of right foot: Principal | ICD-10-CM

## 2017-12-09 DIAGNOSIS — M2011 Hallux valgus (acquired), right foot: Secondary | ICD-10-CM

## 2017-12-09 DIAGNOSIS — M2041 Other hammer toe(s) (acquired), right foot: Secondary | ICD-10-CM

## 2017-12-09 NOTE — Patient Instructions (Signed)
Pre-Operative Instructions  Congratulations, you have decided to take an important step towards improving your quality of life.  You can be assured that the doctors and staff at [facility] will be with you every step of the way.  Here are some important things you should know:  1. Plan to be at the surgery center/hospital at least 1 (one) hour prior to your scheduled time, unless otherwise directed by the surgical center/hospital staff.  You must have a responsible adult accompany you, remain during the surgery and drive you home.  Make sure you have directions to the surgical center/hospital to ensure you arrive on time. 2. If you are having surgery at [facility] or [facility] hospitals, you will need a copy of your medical history and physical form from your family physician within one month prior to the date of surgery. We will give you a form for your primary physician to complete.  3. We make every effort to accommodate the date you request for surgery.  However, there are times where surgery dates or times have to be moved.  We will contact you as soon as possible if a change in schedule is required.   4. No aspirin/ibuprofen for one week before surgery.  If you are on aspirin, any non-steroidal anti-inflammatory medications (Mobic, Aleve, Ibuprofen) should not be taken seven (7) days prior to your surgery.  You make take Tylenol for pain prior to surgery.  5. Medications - If you are taking daily heart and blood pressure medications, seizure, reflux, allergy, asthma, anxiety, pain or diabetes medications, make sure you notify the surgery center/hospital before the day of surgery so they can tell you which medications you should take or avoid the day of surgery. 6. No food or drink after midnight the night before surgery unless directed otherwise by surgical center/hospital staff. 7. No alcoholic beverages 24-hours prior to surgery.  No smoking 24-hours prior or 24-hours after  surgery. 8. Wear loose pants or shorts. They should be loose enough to fit over bandages, boots, and casts. 9. Don't wear slip-on shoes. Sneakers are preferred. 10. Bring your boot with you to the surgery center/hospital.  Also bring crutches or a walker if your physician has prescribed it for you.  If you do not have this equipment, it will be provided for you after surgery. 11. If you have not been contacted by the surgery center/hospital by the day before your surgery, call to confirm the date and time of your surgery. 12. Leave-time from work may vary depending on the type of surgery you have.  Appropriate arrangements should be made prior to surgery with your employer. 13. Prescriptions will be provided immediately following surgery by your doctor.  Fill these as soon as possible after surgery and take the medication as directed. Pain medications will not be refilled on weekends and must be approved by the doctor. 14. Remove nail polish on the operative foot and avoid getting pedicures prior to surgery. 15. Wash the night before surgery.  The night before surgery wash the foot and leg well with water and the antibacterial soap provided. Be sure to pay special attention to beneath the toenails and in between the toes.  Wash for at least three (3) minutes. Rinse thoroughly with water and dry well with a towel.  Perform this wash unless told not to do so by your physician.  Enclosed: 1 Ice pack (please put in freezer the night before surgery)   1 Hibiclens skin cleaner     Pre-op instructions  If you have any questions regarding the instructions, please do not hesitate to call our office.  [place]: [address], [place], [place] [address] -- [phone]  [place]: [address] [place], [place] [address] -- [phone]  [place]: [address]  [place], [place] [address] -- [phone]  [place]: [address], [address], [place], [place] [address] -- [phone]  Website:  [email or web address] 

## 2017-12-15 NOTE — Progress Notes (Signed)
   Subjective:  57 year old female presents to the office today for follow up evaluation of a bunion of the right foot and hammertoes of the 2nd digit bilaterally. She was scheduled for surgery 56 years ago but was unable to have it done due to losing insurance. She now has insurance again and wants to reschedule the surgery. Patient is here for further evaluation and treatment.   Past Medical History:  Diagnosis Date  . Acid reflux    takes Zantac and Omeprazole daily  . Anxiety    takes Citaopram daily  . Arthritis    right knee  . Chronic back pain    DDD  . Clotting disorder ([place])   . History of blood clots    42yrs ago and in left leg  . History of bronchitis 3+yrs ago  . History of migraine    has been a while since last one  . Hyperlipidemia    takes Atorvastatin daily  . Hypertension    takes Lisinopril and HCTZ daily  . Insomnia    takes Elavil nightly as needed  . Joint pain   . Joint swelling   . Paraesophageal hernia   . Weakness    numbness and tingling in both feet     Objective: Physical Exam General: The patient is alert and oriented x3 in no acute distress.  Dermatology: Skin is cool, dry and supple bilateral lower extremities. Negative for open lesions or macerations.  Vascular: Palpable pedal pulses bilaterally. No edema or erythema noted. Capillary refill within normal limits.  Neurological: Epicritic and protective threshold grossly intact bilaterally.   Musculoskeletal Exam: Clinical evidence of bunion deformity noted to the respective foot. There is a moderate pain on palpation range of motion of the first MPJ. Lateral deviation of the hallux noted consistent with hallux abductovalgus. Hammertoe contracture also noted on clinical exam to digits 2 of the bilateral foot. Symptomatic pain on palpation and range of motion also noted to the metatarsal phalangeal joints of the respective hammertoe digits.    Radiographic Exam: Increased intermetatarsal  angle greater than 15 with a hallux abductus angle greater than 30 noted on AP view. Moderate degenerative changes noted within the first MPJ. Contracture deformity also noted to the interphalangeal joints and MPJs of the digits of the respective hammertoes.  Assessment: 1. 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 Hammertoe deformity second digit bilateral lower extremity  Plan of Care:  1. Patient was evaluated. X-rays were reviewed today. 2. Today we discussed conservative versus surgical management of the patient's presenting symptoms. Patient opts for surgical management. All possible complications and details of the procedure were explained. No guarantees were expressed or implied. All patient questions were answered. 3. Re-authorization for surgery initiated today. Surgery will consist of bunionectomy with 1st metatarsal osteotomy right foot. PIPJ arthroplasty with MPJ capsulotomy 2nd right. 4. Return to clinic 1 week postop   Edrick Kins, DPM [facility]  Dr. Edrick Kins, [address]                                        [place], [place] [address]                Office [phone]  Fax [phone]

## 2017-12-23 ENCOUNTER — Ambulatory Visit (INDEPENDENT_AMBULATORY_CARE_PROVIDER_SITE_OTHER): Payer: Self-pay

## 2017-12-23 ENCOUNTER — Encounter (INDEPENDENT_AMBULATORY_CARE_PROVIDER_SITE_OTHER): Payer: Self-pay | Admitting: Orthopaedic Surgery

## 2017-12-23 ENCOUNTER — Ambulatory Visit (INDEPENDENT_AMBULATORY_CARE_PROVIDER_SITE_OTHER): Payer: No Typology Code available for payment source | Admitting: Orthopaedic Surgery

## 2017-12-23 DIAGNOSIS — M79642 Pain in left hand: Secondary | ICD-10-CM

## 2017-12-23 MED ORDER — DICLOFENAC SODIUM 1 % TD GEL: 2.0000 g | Freq: Four times a day (QID) | TRANSDERMAL | 2 refills | Status: DC | PRN

## 2017-12-23 NOTE — Progress Notes (Signed)
Office Visit Note   Patient: [name]           Date of Birth: 11-17-1960           MRN: [id number] Visit Date: 12/23/2017              Requested by: Esaw Grandchild, NP [place], [place] [address] PCP: Esaw Grandchild, NP   Assessment & Plan: Visit Diagnoses:  1. 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  In regards to the [facility] joint, we will provide the patient with a thumb spica splint to wear for comfort.  I have discussed proceeding with a cortisone injection, but she states that the cortisone injections never helped and she does not want one today.  In regards to the carpal tunnel syndrome, we will obtain a nerve conduction study to further assess this.  She will follow-up with [place] once this is been completed.  Call with concerns or questions in the meantime.  Follow-Up Instructions: Return in about 2 weeks (around 01/06/2018).   Orders:  Orders Placed This Encounter  Procedures  . XR Hand Complete Left   Meds ordered this encounter  Medications  . diclofenac sodium (VOLTAREN) 1 % GEL    Sig: Apply 2 g topically 4 (four) times daily as needed.    Dispense:  1 Tube    Refill:  2      Procedures: No procedures performed   Clinical Data: No additional findings.   Subjective: Chief Complaint  Patient presents with  . Left Wrist - Pain    HPI patient is a pleasant 57 year old female who presents to our clinic today for follow-up of her right shoulder as well as new onset left wrist pain.  In regards to the right shoulder, she has several months status post right shoulder arthroscopic rotator cuff repair.  Doing well until a fall shortly after surgery.  She is doing much better now.  No pain and full range of motion.  Her main issue is her left hand.  She has had years of pain to the left hand primarily to the volar aspect.  She has an occasional sharp shooting pain which seems to  radiate from the middle of the dorsum of the hand into her thumb.  She notes numbness to the thumb.  Pain is worse with activity.  She does note that she cross stitches and has a history of rheumatoid arthritis.  No previous work-up for carpal tunnel syndrome although she does state that her mother has had this on both sides.  No neck pathology.  Review of Systems as detailed in HPI.  All others reviewed and are negative.   Objective: Vital Signs: LMP 02/07/2013   Physical Exam well-developed well-nourished female no acute distress.  Alert and oriented x3.  Ortho Exam examination of the right shoulder reveals full active range of motion in all planes.  Examination of the left hand reveals diffuse tenderness to the entire hand.  No tenderness to the [facility] joint, although she has a markedly positive grind test. negative Wynn Maudlin.  She is able to make a full composite fist.  Negative Phalen and negative Tinel at the wrist.  Positive Tinel at the elbow.  She is neurovascularly intact distally.  Specialty Comments:  No specialty comments available.  Imaging: Xr Hand Complete Left  Result Date: 12/23/2017 Severe degenerative joint disease first [facility] joint  with diffuse changes throughout.    PMFS History: Patient Active Problem List   Diagnosis Date Noted  . Pain in left hand 12/23/2017  . Healthcare maintenance 11/16/2017  . Tinea versicolor 11/16/2017  . Chronic right shoulder pain 09/02/2017  . Fibromyalgia 08/11/2017  . Family history of diabetes mellitus 06/23/2016  . Other fatigue 06/23/2016  . Insomnia 06/23/2016  . Rheumatoid arthritis ([place]) 06/23/2016  . Nontraumatic incomplete tear of right rotator cuff 04/28/2016  . Impingement syndrome of right shoulder 03/31/2016  . Status post right partial knee replacement 09/15/2014  . Acute medial meniscal injury of knee 06/14/2014  . Status post total knee replacement using cement 06/08/2014  . Primary osteoarthritis of right knee  06/08/2014  . Screening for colon cancer   . Schatzki's ring   . Hiatal hernia   . Dysphagia, pharyngoesophageal phase 05/26/2014  . Encounter for screening colonoscopy 05/26/2014  . Adrenal adenoma 05/01/2014  . Breast lump 05/01/2014  . Metatarsalgia of both feet 01/12/2014  . Bilateral leg pain 01/11/2014  . Bilateral edema of lower extremity 01/11/2014  . Other osteoarthritis of spine, thoracolumbar region 12/26/2013  . Hypercholesteremia 05/19/2013  . Periodic health assessment, general screening, adult 04/13/2013  . GERD (gastroesophageal reflux disease) 04/13/2013  . HTN (hypertension) 04/13/2013  . Dyspnea 04/13/2013   Past Medical History:  Diagnosis Date  . Acid reflux    takes Zantac and Omeprazole daily  . Anxiety    takes Citaopram daily  . Arthritis    right knee  . Chronic back pain    DDD  . Clotting disorder ([place])   . History of blood clots    67yrs ago and in left leg  . History of bronchitis 3+yrs ago  . History of migraine    has been a while since last one  . Hyperlipidemia    takes Atorvastatin daily  . Hypertension    takes Lisinopril and HCTZ daily  . Insomnia    takes Elavil nightly as needed  . Joint pain   . Joint swelling   . Paraesophageal hernia   . Weakness    numbness and tingling in both feet    Family History  Problem Relation Age of Onset  . Cancer Father        lung  . Cancer Sister        breast  . Breast cancer Sister 39  . Cancer Maternal Grandfather        lung  . Cancer Paternal Grandfather        lung  . Diabetes Son   . Colon cancer Neg Hx     Past Surgical History:  Procedure Laterality Date  . BREAST BIOPSY Left   . CHONDROPLASTY Right 06/28/2014   Procedure: CHONDROPLASTY;  Surgeon: Marianna Payment, MD;  Location: [place];  Service: Orthopedics;  Laterality: Right;  . COLONOSCOPY N/A 05/29/2014   Procedure: COLONOSCOPY;  Surgeon: Daneil Dolin, MD;  [facility];  Service:  Endoscopy;  Laterality: N/A;  215pm- Pt is working until 12:00 so she can't come any earlier  . ESOPHAGOGASTRODUODENOSCOPY N/A 05/29/2014   Procedure: ESOPHAGOGASTRODUODENOSCOPY (EGD);  Surgeon: Daneil Dolin, MD;  [facility];  Service: Endoscopy;  Laterality: N/A;  . GANGLION CYST EXCISION Left 01/13/2002  . KNEE ARTHROSCOPY WITH MEDIAL MENISECTOMY Right 06/28/2014   Procedure: RIGHT KNEE ARTHROSCOPY WITH PARTIAL MEDIAL MENISCECTOMY AND CHONDROPLASTY;  Surgeon: Marianna Payment, MD;  Location: [place];  Service: Orthopedics;  Laterality: Right;  . MALONEY DILATION N/A 05/29/2014   Procedure: Venia Minks DILATION;  Surgeon: Daneil Dolin, MD;  [facility];  Service: Endoscopy;  Laterality: N/A;  . PARTIAL KNEE ARTHROPLASTY Right 09/15/2014   Procedure: RIGHT UNICOMPARTMENTAL KNEE ARTHROPLASTY;  Surgeon: Leandrew Koyanagi, MD;  Location: [place];  Service: Orthopedics;  Laterality: Right;  . TOTAL KNEE ARTHROPLASTY Left 04/08/2004   Social History   Occupational History  . Not on file  Tobacco Use  . Smoking status: Never Smoker  . Smokeless tobacco: Never Used  Substance and Sexual Activity  . Alcohol use: No    Alcohol/week: 0.0 standard drinks  . Drug use: No  . Sexual activity: Not Currently    Birth control/protection: None

## 2018-01-07 ENCOUNTER — Ambulatory Visit (INDEPENDENT_AMBULATORY_CARE_PROVIDER_SITE_OTHER): Payer: Self-pay | Admitting: Physical Medicine and Rehabilitation

## 2018-01-07 ENCOUNTER — Encounter (INDEPENDENT_AMBULATORY_CARE_PROVIDER_SITE_OTHER): Payer: Self-pay | Admitting: Physical Medicine and Rehabilitation

## 2018-01-07 ENCOUNTER — Other Ambulatory Visit: Payer: Self-pay | Admitting: Adult Health

## 2018-01-07 DIAGNOSIS — I10 Essential (primary) hypertension: Secondary | ICD-10-CM

## 2018-01-07 DIAGNOSIS — R202 Paresthesia of skin: Secondary | ICD-10-CM

## 2018-01-07 MED FILL — OMEPRAZOLE 20 MG CAP: 20 | 30 days supply | Qty: 60 | Fill #0

## 2018-01-07 MED FILL — KETOCONAZOLE 2% SHAMPOO: 2 | 30 days supply | Qty: 120 | Fill #0

## 2018-01-07 MED FILL — HYDROCHLOROTHIAZIDE 25 MG T: 25 | 30 days supply | Qty: 60 | Fill #0

## 2018-01-07 NOTE — Progress Notes (Signed)
 .  Numeric Pain Rating Scale and Functional Assessment Average Pain 10   In the last MONTH (on 0-10 scale) has pain interfered with the following?  1. General activity like being  able to carry out your everyday physical activities such as walking, climbing stairs, carrying groceries, or moving a chair?  Rating(7)     

## 2018-01-08 ENCOUNTER — Encounter (INDEPENDENT_AMBULATORY_CARE_PROVIDER_SITE_OTHER): Payer: Self-pay | Admitting: Physical Medicine and Rehabilitation

## 2018-01-08 MED FILL — DICLOFENAC SODIUM 1% GEL: 1 | 12 days supply | Qty: 100 | Fill #0

## 2018-01-08 MED FILL — LISINOPRIL 40 MG TABLET: 40 | 30 days supply | Qty: 30 | Fill #0

## 2018-01-08 NOTE — Progress Notes (Addendum)
EMER ONNEN - 57 y.o. female MRN [id number]  Date of birth: [birth year]  Office Visit Note: Visit Date: 01/07/2018 PCP: Esaw Grandchild, NP Referred by: Esaw Grandchild, NP  Subjective: Chief Complaint  Patient presents with  . Left Hand - Pain, Numbness, Tingling  . Left Forearm - Pain, Numbness, Tingling   HPI: [name] is a 57 year old right-hand-dominant female comes in today at the request of [name] for evaluation management of her left hand pain and numbness.  She has a history of chronic shoulder problems status post arthroscopic surgery months ago.  She is been following with Dr. Erlinda Hong and his assistant Tawanna Cooler for some time.  She reports a fall after surgery with increased pain since that time.  They have been monitoring her shoulder.  Since that time she is starting to endorse severe left hand pain particularly in the area of the [facility] joint with sharp shooting pains with movement.  She reports that she does a lot of cross stitching and it seems to irritate the hand with numbness and tingling.  She really reports nothing has helped so far with the symptoms.  She has had medication management without any relief.  She has not had prior electrodiagnostic studies.  She has cervical spine films from a couple of years ago that were essentially normal for her age.  She does not endorse any frank radicular symptoms or specific injuries.  She has no MRI imaging of the cervical spine.  She does get the numbness which seems to occur in somewhat of a nondermatomal pattern but more in the radial digits but sometimes the fifth digit.  Denies any real right sided hand symptoms.  She rates her pain as a 10 out of 10.  She is failed all other care at this point.   Review of Systems  Constitutional: Negative for chills, fever, malaise/fatigue and weight loss.  HENT: Negative for hearing loss and sinus pain.   Eyes: Negative for blurred vision, double vision and photophobia.  Respiratory: Negative  for cough and shortness of breath.   Cardiovascular: Negative for chest pain, palpitations and leg swelling.  Gastrointestinal: Negative for abdominal pain, nausea and vomiting.  Genitourinary: Negative for flank pain.  Musculoskeletal: Positive for joint pain. Negative for myalgias.  Skin: Negative for itching and rash.  Neurological: Positive for tingling. Negative for tremors, focal weakness and weakness.  Endo/Heme/Allergies: Negative.   Psychiatric/Behavioral: Negative for depression.  All other systems reviewed and are negative.  Otherwise per HPI.  Assessment & Plan: Visit Diagnoses:  1. Paresthesia of skin     Plan: No additional findings.   Impression: Essentially NORMAL electrodiagnostic study of the left upper limb.  There is no significant electrodiagnostic evidence of nerve entrapment, brachial plexopathy or cervical radiculopathy.    As you know, purely sensory or demyelinating radiculopathies and chemical radiculitis may not be detected with this particular electrodiagnostic study.  Recommendations: 1.  Follow-up with referring physician. 2.  Continue current management of symptoms.  Meds & Orders: No orders of the defined types were placed in this encounter.   Orders Placed This Encounter  Procedures  . NCV with EMG (electromyography)    Follow-up: Return for  [name], M.D..   Procedures: No procedures performed  EMG & NCV Findings: Evaluation of the left median motor nerve showed reduced amplitude (4.2 mV).  All remaining nerves (as indicated in the following tables) were within normal limits.    All examined muscles (as indicated  in the following table) showed no evidence of electrical instability.    Impression: Essentially NORMAL electrodiagnostic study of the left upper limb.  There is no significant electrodiagnostic evidence of nerve entrapment, brachial plexopathy or cervical radiculopathy.    As you know, purely sensory or demyelinating  radiculopathies and chemical radiculitis may not be detected with this particular electrodiagnostic study.  Recommendations: 1.  Follow-up with referring physician. 2.  Continue current management of symptoms.  ___________________________ Laurence Spates FAAPMR Board Certified, American Board of Physical Medicine and Rehabilitation    Nerve Conduction Studies Anti Sensory Summary Table   Stim Site NR Peak (ms) Norm Peak (ms) P-T Amp (V) Norm P-T Amp Site1 Site2 Delta-P (ms) Dist (cm) Vel (m/s) Norm Vel (m/s)  Left Median Acr Palm Anti Sensory (2nd Digit)  32.5C  Wrist    3.3 <3.6 30.9 >10 Wrist Palm 1.5 0.0    Palm    1.8 <2.0 35.2         Left Radial Anti Sensory (Base 1st Digit)  32.3C  Wrist    2.0 <3.1 23.5  Wrist Base 1st Digit 2.0 0.0    Left Ulnar Anti Sensory (5th Digit)  32.9C  Wrist    3.1 <3.7 27.8 >15.0 Wrist 5th Digit 3.1 14.0 45 >38   Motor Summary Table   Stim Site NR Onset (ms) Norm Onset (ms) O-P Amp (mV) Norm O-P Amp Site1 Site2 Delta-0 (ms) Dist (cm) Vel (m/s) Norm Vel (m/s)  Left Median Motor (Abd Poll Brev)  32.6C  Wrist    3.4 <4.2 *4.2 >5 Elbow Wrist 3.8 21.0 55 >50  Elbow    7.2  4.1         Left Ulnar Motor (Abd Dig Min)  32.7C  Wrist    2.6 <4.2 8.3 >3 B Elbow Wrist 2.9 19.0 66 >53  B Elbow    5.5  7.1  A Elbow B Elbow 1.1 10.0 91 >53  A Elbow    6.6  7.0          EMG   Side Muscle Nerve Root Ins Act Fibs Psw Amp Dur Poly Recrt Int Fraser Din Comment  Left Abd Poll Brev Median C8-T1 Nml Nml Nml Nml Nml 0 Nml Nml   Left 1stDorInt Ulnar C8-T1 Nml Nml Nml Nml Nml 0 Nml Nml   Left PronatorTeres Median C6-7 Nml Nml Nml Nml Nml 0 Nml Nml   Left Biceps Musculocut C5-6 Nml Nml Nml Nml Nml 0 Nml Nml   Left Deltoid Axillary C5-6 Nml Nml Nml Nml Nml 0 Nml Nml     Nerve Conduction Studies Anti Sensory Left/Right Comparison   Stim Site L Lat (ms) R Lat (ms) L-R Lat (ms) L Amp (V) R Amp (V) L-R Amp (%) Site1 Site2 L Vel (m/s) R Vel (m/s) L-R Vel (m/s)  Median  Acr Palm Anti Sensory (2nd Digit)  32.5C  Wrist 3.3   30.9   Wrist Palm     Palm 1.8   35.2         Radial Anti Sensory (Base 1st Digit)  32.3C  Wrist 2.0   23.5   Wrist Base 1st Digit     Ulnar Anti Sensory (5th Digit)  32.9C  Wrist 3.1   27.8   Wrist 5th Digit 45     Motor Left/Right Comparison   Stim Site L Lat (ms) R Lat (ms) L-R Lat (ms) L Amp (mV) R Amp (mV) L-R Amp (%) Site1 Site2 L Vel (  m/s) R Vel (m/s) L-R Vel (m/s)  Median Motor (Abd Poll Brev)  32.6C  Wrist 3.4   *4.2   Elbow Wrist 55    Elbow 7.2   4.1         Ulnar Motor (Abd Dig Min)  32.7C  Wrist 2.6   8.3   B Elbow Wrist 66    B Elbow 5.5   7.1   A Elbow B Elbow 91    A Elbow 6.6   7.0            Waveforms:            Clinical History: No specialty comments available.   She reports that she has never smoked. She has never used smokeless tobacco. No results for input(s): HGBA1C, LABURIC in the last 8760 hours.  Objective:  VS:  HT:    WT:   BMI:     BP:   HR: bpm  TEMP: ( )  RESP:  Physical Exam  Constitutional: She is oriented to person, place, and time. She appears well-developed and well-nourished.  Eyes: Pupils are equal, round, and reactive to light. Conjunctivae and EOM are normal.  Cardiovascular: Normal rate and intact distal pulses.  Pulmonary/Chest: Effort normal.  Musculoskeletal:  Inspection reveals no atrophy of the bilateral APB or FDI or hand intrinsics.  There is CMC joint arthritic changes on the left more than right with concordant pain on palpation.  There is no synovitis noted.  There is no swelling, color changes, allodynia or dystrophic changes. There is 5 out of 5 strength in the bilateral wrist extension, finger abduction and long finger flexion. There is intact sensation to light touch in all dermatomal and peripheral nerve distributions. There is a negative Froment's test bilaterally. There is a negative Tinel's test at the bilateral wrist and elbow. There is a negative  Phalen's test bilaterally. There is a negative Hoffmann's test bilaterally.  Neurological: She is alert and oriented to person, place, and time. She exhibits normal muscle tone. Coordination normal.  Skin: Skin is warm and dry. No rash noted. No erythema.  Psychiatric: She has a normal mood and affect. Her behavior is normal.  Nursing note and vitals reviewed.   Ortho Exam Imaging: No results found.  Past Medical/Family/Surgical/Social History: Medications & Allergies reviewed per EMR, new medications updated. Patient Active Problem List   Diagnosis Date Noted  . Pain in left hand 12/23/2017  . Healthcare maintenance 11/16/2017  . Tinea versicolor 11/16/2017  . Chronic right shoulder pain 09/02/2017  . Fibromyalgia 08/11/2017  . Family history of diabetes mellitus 06/23/2016  . Other fatigue 06/23/2016  . Insomnia 06/23/2016  . Rheumatoid arthritis ([place]) 06/23/2016  . Nontraumatic incomplete tear of right rotator cuff 04/28/2016  . Impingement syndrome of right shoulder 03/31/2016  . Status post right partial knee replacement 09/15/2014  . Acute medial meniscal injury of knee 06/14/2014  . Status post total knee replacement using cement 06/08/2014  . Primary osteoarthritis of right knee 06/08/2014  . Screening for colon cancer   . Schatzki's ring   . Hiatal hernia   . Dysphagia, pharyngoesophageal phase 05/26/2014  . Encounter for screening colonoscopy 05/26/2014  . Adrenal adenoma 05/01/2014  . Breast lump 05/01/2014  . Metatarsalgia of both feet 01/12/2014  . Bilateral leg pain 01/11/2014  . Bilateral edema of lower extremity 01/11/2014  . Other osteoarthritis of spine, thoracolumbar region 12/26/2013  . Hypercholesteremia 05/19/2013  . Periodic health assessment, general screening, adult 04/13/2013  .  GERD (gastroesophageal reflux disease) 04/13/2013  . HTN (hypertension) 04/13/2013  . Dyspnea 04/13/2013   Past Medical History:  Diagnosis Date  . Acid reflux     takes Zantac and Omeprazole daily  . Anxiety    takes Citaopram daily  . Arthritis    right knee  . Chronic back pain    DDD  . Clotting disorder ([place])   . History of blood clots    36yrs ago and in left leg  . History of bronchitis 3+yrs ago  . History of migraine    has been a while since last one  . Hyperlipidemia    takes Atorvastatin daily  . Hypertension    takes Lisinopril and HCTZ daily  . Insomnia    takes Elavil nightly as needed  . Joint pain   . Joint swelling   . Paraesophageal hernia   . Weakness    numbness and tingling in both feet   Family History  Problem Relation Age of Onset  . Cancer Father        lung  . Cancer Sister        breast  . Breast cancer Sister 39  . Cancer Maternal Grandfather        lung  . Cancer Paternal Grandfather        lung  . Diabetes Son   . Colon cancer Neg Hx    Past Surgical History:  Procedure Laterality Date  . BREAST BIOPSY Left   . CHONDROPLASTY Right 06/28/2014   Procedure: CHONDROPLASTY;  Surgeon: Marianna Payment, MD;  Location: [place];  Service: Orthopedics;  Laterality: Right;  . COLONOSCOPY N/A 05/29/2014   Procedure: COLONOSCOPY;  Surgeon: Daneil Dolin, MD;  [facility];  Service: Endoscopy;  Laterality: N/A;  215pm- Pt is working until 12:00 so she can't come any earlier  . ESOPHAGOGASTRODUODENOSCOPY N/A 05/29/2014   Procedure: ESOPHAGOGASTRODUODENOSCOPY (EGD);  Surgeon: Daneil Dolin, MD;  [facility];  Service: Endoscopy;  Laterality: N/A;  . GANGLION CYST EXCISION Left 01/13/2002  . KNEE ARTHROSCOPY WITH MEDIAL MENISECTOMY Right 06/28/2014   Procedure: RIGHT KNEE ARTHROSCOPY WITH PARTIAL MEDIAL MENISCECTOMY AND CHONDROPLASTY;  Surgeon: Marianna Payment, MD;  Location: [place];  Service: Orthopedics;  Laterality: Right;  . MALONEY DILATION N/A 05/29/2014   Procedure: Venia Minks DILATION;  Surgeon: Daneil Dolin, MD;  [facility];   Service: Endoscopy;  Laterality: N/A;  . PARTIAL KNEE ARTHROPLASTY Right 09/15/2014   Procedure: RIGHT UNICOMPARTMENTAL KNEE ARTHROPLASTY;  Surgeon: Leandrew Koyanagi, MD;  Location: [place];  Service: Orthopedics;  Laterality: Right;  . TOTAL KNEE ARTHROPLASTY Left 04/08/2004   Social History   Occupational History  . Not on file  Tobacco Use  . Smoking status: Never Smoker  . Smokeless tobacco: Never Used  Substance and Sexual Activity  . Alcohol use: No    Alcohol/week: 0.0 standard drinks  . Drug use: No  . Sexual activity: Not Currently    Birth control/protection: None

## 2018-01-08 NOTE — Procedures (Signed)
EMG & NCV Findings: Evaluation of the left median motor nerve showed reduced amplitude (4.2 mV).  All remaining nerves (as indicated in the following tables) were within normal limits.    All examined muscles (as indicated in the following table) showed no evidence of electrical instability.    Impression: Essentially NORMAL electrodiagnostic study of the left upper limb.  There is no significant electrodiagnostic evidence of nerve entrapment, brachial plexopathy or cervical radiculopathy.    As you know, purely sensory or demyelinating radiculopathies and chemical radiculitis may not be detected with this particular electrodiagnostic study.  Recommendations: 1.  Follow-up with referring physician. 2.  Continue current management of symptoms.  ___________________________ Laurence Spates FAAPMR Board Certified, American Board of Physical Medicine and Rehabilitation    Nerve Conduction Studies Anti Sensory Summary Table   Stim Site NR Peak (ms) Norm Peak (ms) P-T Amp (V) Norm P-T Amp Site1 Site2 Delta-P (ms) Dist (cm) Vel (m/s) Norm Vel (m/s)  Left Median Acr Palm Anti Sensory (2nd Digit)  32.5C  Wrist    3.3 <3.6 30.9 >10 Wrist Palm 1.5 0.0    Palm    1.8 <2.0 35.2         Left Radial Anti Sensory (Base 1st Digit)  32.3C  Wrist    2.0 <3.1 23.5  Wrist Base 1st Digit 2.0 0.0    Left Ulnar Anti Sensory (5th Digit)  32.9C  Wrist    3.1 <3.7 27.8 >15.0 Wrist 5th Digit 3.1 14.0 45 >38   Motor Summary Table   Stim Site NR Onset (ms) Norm Onset (ms) O-P Amp (mV) Norm O-P Amp Site1 Site2 Delta-0 (ms) Dist (cm) Vel (m/s) Norm Vel (m/s)  Left Median Motor (Abd Poll Brev)  32.6C  Wrist    3.4 <4.2 *4.2 >5 Elbow Wrist 3.8 21.0 55 >50  Elbow    7.2  4.1         Left Ulnar Motor (Abd Dig Min)  32.7C  Wrist    2.6 <4.2 8.3 >3 B Elbow Wrist 2.9 19.0 66 >53  B Elbow    5.5  7.1  A Elbow B Elbow 1.1 10.0 91 >53  A Elbow    6.6  7.0          EMG   Side Muscle Nerve Root Ins Act Fibs Psw  Amp Dur Poly Recrt Int Fraser Din Comment  Left Abd Poll Brev Median C8-T1 Nml Nml Nml Nml Nml 0 Nml Nml   Left 1stDorInt Ulnar C8-T1 Nml Nml Nml Nml Nml 0 Nml Nml   Left PronatorTeres Median C6-7 Nml Nml Nml Nml Nml 0 Nml Nml   Left Biceps Musculocut C5-6 Nml Nml Nml Nml Nml 0 Nml Nml   Left Deltoid Axillary C5-6 Nml Nml Nml Nml Nml 0 Nml Nml     Nerve Conduction Studies Anti Sensory Left/Right Comparison   Stim Site L Lat (ms) R Lat (ms) L-R Lat (ms) L Amp (V) R Amp (V) L-R Amp (%) Site1 Site2 L Vel (m/s) R Vel (m/s) L-R Vel (m/s)  Median Acr Palm Anti Sensory (2nd Digit)  32.5C  Wrist 3.3   30.9   Wrist Palm     Palm 1.8   35.2         Radial Anti Sensory (Base 1st Digit)  32.3C  Wrist 2.0   23.5   Wrist Base 1st Digit     Ulnar Anti Sensory (5th Digit)  32.9C  Wrist 3.1   27.8  Wrist 5th Digit 45     Motor Left/Right Comparison   Stim Site L Lat (ms) R Lat (ms) L-R Lat (ms) L Amp (mV) R Amp (mV) L-R Amp (%) Site1 Site2 L Vel (m/s) R Vel (m/s) L-R Vel (m/s)  Median Motor (Abd Poll Brev)  32.6C  Wrist 3.4   *4.2   Elbow Wrist 55    Elbow 7.2   4.1         Ulnar Motor (Abd Dig Min)  32.7C  Wrist 2.6   8.3   B Elbow Wrist 66    B Elbow 5.5   7.1   A Elbow B Elbow 91    A Elbow 6.6   7.0            Waveforms:

## 2018-01-12 ENCOUNTER — Ambulatory Visit (INDEPENDENT_AMBULATORY_CARE_PROVIDER_SITE_OTHER): Payer: Self-pay | Admitting: Orthopaedic Surgery

## 2018-01-12 ENCOUNTER — Encounter (INDEPENDENT_AMBULATORY_CARE_PROVIDER_SITE_OTHER): Payer: Self-pay | Admitting: Orthopaedic Surgery

## 2018-01-12 DIAGNOSIS — M1812 Unilateral primary osteoarthritis of first carpometacarpal joint, left hand: Secondary | ICD-10-CM

## 2018-01-12 NOTE — Progress Notes (Signed)
Office Visit Note   Patient: [name]           Date of Birth: September 08, 1960           MRN: [id number] Visit Date: 01/12/2018              Requested by: Esaw Grandchild, NP [place], [place] [id number] PCP: Esaw Grandchild, NP   Assessment & Plan: Visit Diagnoses:  1. Primary osteoarthritis of first carpometacarpal joint of left hand     Plan: Patient has eaton stage III-IV left first CMC arthritis.  In view of her NCS shows no evidence of nerve entrapment, specifically the median nerve.  She has had very minimal relief with conservative treatment.  She is not interested in steroid injections.  Today we will schedule her for surgery.  Risks and benefits and postoperative rehab and recovery were discussed with the patient she elects to proceed.  Follow-Up Instructions: Return for 2 week postop visit.   Orders:  No orders of the defined types were placed in this encounter.  No orders of the defined types were placed in this encounter.     Procedures: No procedures performed   Clinical Data: No additional findings.   Subjective: Chief Complaint  Patient presents with  . Follow-up    HPI 57 year old female with left thumb pain.  She presents today for review of nerve conduction study for evaluation of possible carpal tunnel syndrome.  Since she was seen about 3 weeks ago she has had no change in her pain.  She continues to have pain and stiffness in the morning and worsening pain throughout the day.  Her pain is specifically worsened with gripping objects.  It is usually most noticeable at night and is sharp pain.  She denies any swelling, erythema of the thumb.  Previously tried treatment to include thumb spica, over-the-counter medications, diclofenac gel, and ice.  None of which have been particularly helpful.  No overlying skin changes.  No history of gout   Review of Systems See HPI  Objective: Vital Signs: LMP 02/07/2013   Physical Exam  GEN:  Awake, alert, no acute distress Pulmonary: Breathing unlabored  Ortho Exam  Left hand: Inspection: No obvious deformity. No swelling, erythema or bruising Palpation: Tenderness to palpation at the base of the thumb at the first [facility] ROM: Full ROM of the digits and wrist.  First CMC pain with forming a fist and with thumb opposition. Strength: 5/5 strength Neurovascular: NV intact Special tests: Negative finkelstein's, negative tinel's at the carpal tunnel   Specialty Comments:  No specialty comments available.  Imaging: No results found.   PMFS History: Patient Active Problem List   Diagnosis Date Noted  . Pain in left hand 12/23/2017  . Healthcare maintenance 11/16/2017  . Tinea versicolor 11/16/2017  . Chronic right shoulder pain 09/02/2017  . Fibromyalgia 08/11/2017  . Family history of diabetes mellitus 06/23/2016  . Other fatigue 06/23/2016  . Insomnia 06/23/2016  . Rheumatoid arthritis ([place]) 06/23/2016  . Nontraumatic incomplete tear of right rotator cuff 04/28/2016  . Impingement syndrome of right shoulder 03/31/2016  . Status post right partial knee replacement 09/15/2014  . Acute medial meniscal injury of knee 06/14/2014  . Status post total knee replacement using cement 06/08/2014  . Primary osteoarthritis of right knee 06/08/2014  . Screening for colon cancer   . Schatzki's ring   . Hiatal hernia   . Dysphagia, pharyngoesophageal phase 05/26/2014  . Encounter for  screening colonoscopy 05/26/2014  . Adrenal adenoma 05/01/2014  . Breast lump 05/01/2014  . Metatarsalgia of both feet 01/12/2014  . Bilateral leg pain 01/11/2014  . Bilateral edema of lower extremity 01/11/2014  . Other osteoarthritis of spine, thoracolumbar region 12/26/2013  . Hypercholesteremia 05/19/2013  . Periodic health assessment, general screening, adult 04/13/2013  . GERD (gastroesophageal reflux disease) 04/13/2013  . HTN (hypertension) 04/13/2013  . Dyspnea 04/13/2013   Past  Medical History:  Diagnosis Date  . Acid reflux    takes Zantac and Omeprazole daily  . Anxiety    takes Citaopram daily  . Arthritis    right knee  . Chronic back pain    DDD  . Clotting disorder ([place])   . History of blood clots    5yrs ago and in left leg  . History of bronchitis 3+yrs ago  . History of migraine    has been a while since last one  . Hyperlipidemia    takes Atorvastatin daily  . Hypertension    takes Lisinopril and HCTZ daily  . Insomnia    takes Elavil nightly as needed  . Joint pain   . Joint swelling   . Paraesophageal hernia   . Weakness    numbness and tingling in both feet    Family History  Problem Relation Age of Onset  . Cancer Father        lung  . Cancer Sister        breast  . Breast cancer Sister 76  . Cancer Maternal Grandfather        lung  . Cancer Paternal Grandfather        lung  . Diabetes Son   . Colon cancer Neg Hx     Past Surgical History:  Procedure Laterality Date  . BREAST BIOPSY Left   . CHONDROPLASTY Right 06/28/2014   Procedure: CHONDROPLASTY;  Surgeon: Marianna Payment, MD;  Location: [facility];  Service: Orthopedics;  Laterality: Right;  . COLONOSCOPY N/A 05/29/2014   Procedure: COLONOSCOPY;  Surgeon: Daneil Dolin, MD;  [facility];  Service: Endoscopy;  Laterality: N/A;  215pm- Pt is working until 12:00 so she can't come any earlier  . ESOPHAGOGASTRODUODENOSCOPY N/A 05/29/2014   Procedure: ESOPHAGOGASTRODUODENOSCOPY (EGD);  Surgeon: Daneil Dolin, MD;  [facility];  Service: Endoscopy;  Laterality: N/A;  . GANGLION CYST EXCISION Left 01/13/2002  . KNEE ARTHROSCOPY WITH MEDIAL MENISECTOMY Right 06/28/2014   Procedure: RIGHT KNEE ARTHROSCOPY WITH PARTIAL MEDIAL MENISCECTOMY AND CHONDROPLASTY;  Surgeon: Marianna Payment, MD;  Location: [facility];  Service: Orthopedics;  Laterality: Right;  . MALONEY DILATION N/A 05/29/2014   Procedure: Venia Minks DILATION;   Surgeon: Daneil Dolin, MD;  [facility];  Service: Endoscopy;  Laterality: N/A;  . PARTIAL KNEE ARTHROPLASTY Right 09/15/2014   Procedure: RIGHT UNICOMPARTMENTAL KNEE ARTHROPLASTY;  Surgeon: Leandrew Koyanagi, MD;  Location: [facility];  Service: Orthopedics;  Laterality: Right;  . TOTAL KNEE ARTHROPLASTY Left 04/08/2004   Social History   Occupational History  . Not on file  Tobacco Use  . Smoking status: Never Smoker  . Smokeless tobacco: Never Used  Substance and Sexual Activity  . Alcohol use: No    Alcohol/week: 0.0 standard drinks  . Drug use: No  . Sexual activity: Not Currently    Birth control/protection: None

## 2018-01-21 ENCOUNTER — Encounter (HOSPITAL_BASED_OUTPATIENT_CLINIC_OR_DEPARTMENT_OTHER): Payer: Self-pay | Admitting: *Deleted

## 2018-01-21 ENCOUNTER — Other Ambulatory Visit: Payer: Self-pay

## 2018-01-22 ENCOUNTER — Telehealth: Payer: Self-pay | Admitting: *Deleted

## 2018-01-22 NOTE — Telephone Encounter (Signed)
I attempted to call the patient to inform her that her surgery will be done at [facility] instead of [facility].  I left her a message to call me back.

## 2018-01-25 ENCOUNTER — Telehealth (INDEPENDENT_AMBULATORY_CARE_PROVIDER_SITE_OTHER): Payer: Self-pay | Admitting: Orthopaedic Surgery

## 2018-01-25 NOTE — Telephone Encounter (Signed)
"  This is Tagan Bartram returning you call.  If you could, call me back."

## 2018-01-25 NOTE — Telephone Encounter (Signed)
See message below  Please advise

## 2018-01-25 NOTE — Telephone Encounter (Signed)
Sherrie can you help with this?   Thanks.

## 2018-01-25 NOTE — Progress Notes (Signed)
Patient called to advise that she is having trouble getting a ride to come and get EKG/labs completed before DOS.  She advised she is going to continue to try and get a ride, but does not think she will be able to get one.

## 2018-01-25 NOTE — Telephone Encounter (Signed)
Patient called advised [name] short stay called advised patient would nee to come before Tuesday and have labs done. Patient said they also wanted to do something pertaining to her heart.  Patient said she don't have a car to drive Patient said they said Dr Erlinda Hong could make the call if she needed this or can she do what they need the day of surgery. The number to contact patient is [phone]

## 2018-01-26 NOTE — Telephone Encounter (Signed)
Spoke with patient and Tammy at [facility], patient will have same day labs.

## 2018-01-26 NOTE — Telephone Encounter (Signed)
I called patient and left voicemail to call me back

## 2018-01-27 ENCOUNTER — Ambulatory Visit (HOSPITAL_BASED_OUTPATIENT_CLINIC_OR_DEPARTMENT_OTHER): Payer: Self-pay | Admitting: Anesthesiology

## 2018-01-27 ENCOUNTER — Encounter (HOSPITAL_BASED_OUTPATIENT_CLINIC_OR_DEPARTMENT_OTHER): Payer: Self-pay

## 2018-01-27 ENCOUNTER — Ambulatory Visit (HOSPITAL_BASED_OUTPATIENT_CLINIC_OR_DEPARTMENT_OTHER)
Admission: RE | Admit: 2018-01-27 | Discharge: 2018-01-27 | Disposition: A | Discharge status: Home / Self Care | Payer: Self-pay | Source: Ambulatory Visit | Attending: Orthopaedic Surgery | Admitting: Orthopaedic Surgery

## 2018-01-27 ENCOUNTER — Encounter (INDEPENDENT_AMBULATORY_CARE_PROVIDER_SITE_OTHER): Payer: Self-pay | Admitting: Orthopaedic Surgery

## 2018-01-27 ENCOUNTER — Encounter (HOSPITAL_BASED_OUTPATIENT_CLINIC_OR_DEPARTMENT_OTHER)
Admission: RE | Disposition: A | Discharge status: Home / Self Care | Payer: Self-pay | Source: Ambulatory Visit | Attending: Orthopaedic Surgery

## 2018-01-27 ENCOUNTER — Other Ambulatory Visit: Payer: Self-pay

## 2018-01-27 DIAGNOSIS — Z96652 Presence of left artificial knee joint: Secondary | ICD-10-CM | POA: Insufficient documentation

## 2018-01-27 DIAGNOSIS — K219 Gastro-esophageal reflux disease without esophagitis: Secondary | ICD-10-CM | POA: Insufficient documentation

## 2018-01-27 DIAGNOSIS — M1812 Unilateral primary osteoarthritis of first carpometacarpal joint, left hand: Secondary | ICD-10-CM

## 2018-01-27 DIAGNOSIS — F419 Anxiety disorder, unspecified: Secondary | ICD-10-CM | POA: Insufficient documentation

## 2018-01-27 DIAGNOSIS — I10 Essential (primary) hypertension: Secondary | ICD-10-CM | POA: Insufficient documentation

## 2018-01-27 DIAGNOSIS — Z79899 Other long term (current) drug therapy: Secondary | ICD-10-CM | POA: Insufficient documentation

## 2018-01-27 DIAGNOSIS — G4700 Insomnia, unspecified: Secondary | ICD-10-CM | POA: Insufficient documentation

## 2018-01-27 DIAGNOSIS — E785 Hyperlipidemia, unspecified: Secondary | ICD-10-CM | POA: Insufficient documentation

## 2018-01-27 HISTORY — PX: CARPOMETACARPEL SUSPENSION PLASTY: SHX5005

## 2018-01-27 LAB — POCT I-STAT, CHEM 8
BUN: 18 mg/dL (ref 6–20)
CHLORIDE: 103 mmol/L (ref 98–111)
CREATININE: 1.2 mg/dL — AB (ref 0.44–1.00)
Calcium, Ion: 1.25 mmol/L (ref 1.15–1.40)
Glucose, Bld: 104 mg/dL — ABNORMAL HIGH (ref 70–99)
HEMATOCRIT: 35 % — AB (ref 36.0–46.0)
Hemoglobin: 11.9 g/dL — ABNORMAL LOW (ref 12.0–15.0)
Potassium: 4.2 mmol/L (ref 3.5–5.1)
SODIUM: 142 mmol/L (ref 135–145)
TCO2: 25 mmol/L (ref 22–32)

## 2018-01-27 SURGERY — CARPOMETACARPEL (CMC) SUSPENSION PLASTY
Anesthesia: General | Site: Thumb | Laterality: Left

## 2018-01-27 MED ORDER — MIDAZOLAM HCL 2 MG/2ML IJ SOLN
1.0000 mg | INTRAMUSCULAR | Status: DC | PRN
  Administered 2018-01-27: 2 mg via INTRAVENOUS

## 2018-01-27 MED ORDER — OXYCODONE-ACETAMINOPHEN 5-325 MG PO TABS: 1.0000 | ORAL_TABLET | Freq: Four times a day (QID) | ORAL | 0 refills | Status: DC | PRN

## 2018-01-27 MED ORDER — FENTANYL CITRATE (PF) 100 MCG/2ML IJ SOLN
INTRAMUSCULAR | Status: DC | PRN
  Administered 2018-01-27: 100 ug via INTRAVENOUS

## 2018-01-27 MED ORDER — PROMETHAZINE HCL 25 MG PO TABS: 25.0000 mg | ORAL_TABLET | Freq: Four times a day (QID) | ORAL | 1 refills | Status: DC | PRN

## 2018-01-27 MED ORDER — CHLORHEXIDINE GLUCONATE 4 % EX LIQD: 60.0000 mL | Freq: Once | CUTANEOUS | Status: DC

## 2018-01-27 MED ORDER — SENNOSIDES-DOCUSATE SODIUM 8.6-50 MG PO TABS: 1.0000 | ORAL_TABLET | Freq: Every evening | ORAL | 1 refills | Status: DC | PRN

## 2018-01-27 MED ORDER — LACTATED RINGERS IV SOLN
INTRAVENOUS | Status: DC
  Administered 2018-01-27: 08:00:00 via INTRAVENOUS

## 2018-01-27 MED ORDER — PHENYLEPHRINE 40 MCG/ML (10ML) SYRINGE FOR IV PUSH (FOR BLOOD PRESSURE SUPPORT)
PREFILLED_SYRINGE | INTRAVENOUS | Status: AC
  Filled 2018-01-27: qty 10

## 2018-01-27 MED ORDER — LIDOCAINE HCL (CARDIAC) PF 100 MG/5ML IV SOSY
PREFILLED_SYRINGE | INTRAVENOUS | Status: DC | PRN
  Administered 2018-01-27: 30 mg via INTRAVENOUS

## 2018-01-27 MED ORDER — MIDAZOLAM HCL 5 MG/5ML IJ SOLN
INTRAMUSCULAR | Status: DC | PRN
  Administered 2018-01-27: 2 mg via INTRAVENOUS

## 2018-01-27 MED ORDER — SCOPOLAMINE 1 MG/3DAYS TD PT72: 1.0000 | MEDICATED_PATCH | Freq: Once | TRANSDERMAL | Status: DC | PRN

## 2018-01-27 MED ORDER — FENTANYL CITRATE (PF) 100 MCG/2ML IJ SOLN
INTRAMUSCULAR | Status: AC
  Filled 2018-01-27: qty 2

## 2018-01-27 MED ORDER — MIDAZOLAM HCL 2 MG/2ML IJ SOLN
INTRAMUSCULAR | Status: AC
  Filled 2018-01-27: qty 2

## 2018-01-27 MED ORDER — FENTANYL CITRATE (PF) 100 MCG/2ML IJ SOLN
50.0000 ug | INTRAMUSCULAR | Status: DC | PRN
  Administered 2018-01-27: 100 ug via INTRAVENOUS

## 2018-01-27 MED ORDER — ONDANSETRON HCL 4 MG/2ML IJ SOLN
INTRAMUSCULAR | Status: AC
  Filled 2018-01-27: qty 2

## 2018-01-27 MED ORDER — CLONIDINE HCL (ANALGESIA) 100 MCG/ML EP SOLN
EPIDURAL | Status: DC | PRN
  Administered 2018-01-27: 80 ug

## 2018-01-27 MED ORDER — LACTATED RINGERS IV SOLN
INTRAVENOUS | Status: DC
  Administered 2018-01-27 (×3): via INTRAVENOUS

## 2018-01-27 MED ORDER — MEPERIDINE HCL 25 MG/ML IJ SOLN: 6.2500 mg | INTRAMUSCULAR | Status: DC | PRN

## 2018-01-27 MED ORDER — ROPIVACAINE HCL 7.5 MG/ML IJ SOLN
INTRAMUSCULAR | Status: DC | PRN
  Administered 2018-01-27: 40 mL via PERINEURAL

## 2018-01-27 MED ORDER — DEXAMETHASONE SODIUM PHOSPHATE 10 MG/ML IJ SOLN
INTRAMUSCULAR | Status: AC
  Filled 2018-01-27: qty 1

## 2018-01-27 MED ORDER — ONDANSETRON HCL 4 MG/2ML IJ SOLN
INTRAMUSCULAR | Status: DC | PRN
  Administered 2018-01-27: 4 mg via INTRAVENOUS

## 2018-01-27 MED ORDER — PHENYLEPHRINE HCL 10 MG/ML IJ SOLN
INTRAMUSCULAR | Status: DC | PRN
  Administered 2018-01-27 (×4): 80 ug via INTRAVENOUS

## 2018-01-27 MED ORDER — FENTANYL CITRATE (PF) 100 MCG/2ML IJ SOLN: 25.0000 ug | INTRAMUSCULAR | Status: DC | PRN

## 2018-01-27 MED ORDER — CEFAZOLIN SODIUM-DEXTROSE 2-4 GM/100ML-% IV SOLN
INTRAVENOUS | Status: AC
  Filled 2018-01-27: qty 100

## 2018-01-27 MED ORDER — CEFAZOLIN SODIUM-DEXTROSE 2-4 GM/100ML-% IV SOLN
2.0000 g | INTRAVENOUS | Status: AC
  Administered 2018-01-27: 2 g via INTRAVENOUS

## 2018-01-27 MED ORDER — PROPOFOL 500 MG/50ML IV EMUL
INTRAVENOUS | Status: AC
  Filled 2018-01-27: qty 50

## 2018-01-27 MED ORDER — OXYCODONE HCL 5 MG PO TABS: 5.0000 mg | ORAL_TABLET | Freq: Once | ORAL | Status: DC | PRN

## 2018-01-27 MED ORDER — ONDANSETRON HCL 4 MG PO TABS: 4.0000 mg | ORAL_TABLET | Freq: Three times a day (TID) | ORAL | 0 refills | Status: DC | PRN

## 2018-01-27 MED ORDER — EPHEDRINE 5 MG/ML INJ
INTRAVENOUS | Status: AC
  Filled 2018-01-27: qty 10

## 2018-01-27 MED ORDER — PROMETHAZINE HCL 25 MG/ML IJ SOLN: 6.2500 mg | INTRAMUSCULAR | Status: DC | PRN

## 2018-01-27 MED ORDER — OXYCODONE HCL 5 MG/5ML PO SOLN: 5.0000 mg | Freq: Once | ORAL | Status: DC | PRN

## 2018-01-27 MED ORDER — PROPOFOL 500 MG/50ML IV EMUL
INTRAVENOUS | Status: DC | PRN
  Administered 2018-01-27: 100 ug/kg/min via INTRAVENOUS

## 2018-01-27 MED ORDER — PROPOFOL 10 MG/ML IV BOLUS
INTRAVENOUS | Status: AC
  Filled 2018-01-27: qty 20

## 2018-01-27 MED ORDER — LIDOCAINE 2% (20 MG/ML) 5 ML SYRINGE
INTRAMUSCULAR | Status: AC
  Filled 2018-01-27: qty 5

## 2018-01-27 SURGICAL SUPPLY — 76 items
BANDAGE ACE 3X5.8 VEL STRL LF (GAUZE/BANDAGES/DRESSINGS) ×3 IMPLANT
BIT DRILL PASSING CMC 1/4 FLEX (BIT) IMPLANT
BLADE MINI RND TIP GREEN BEAV (BLADE) IMPLANT
BLADE SURG 15 STRL LF DISP TIS (BLADE) ×2 IMPLANT
BLADE SURG 15 STRL SS (BLADE) ×6
BNDG CMPR 9X4 STRL LF SNTH (GAUZE/BANDAGES/DRESSINGS) ×1
BNDG ESMARK 4X9 LF (GAUZE/BANDAGES/DRESSINGS) ×3 IMPLANT
BRUSH SCRUB EZ PLAIN DRY (MISCELLANEOUS) ×3 IMPLANT
BUTTON ALL-SUT W/BACKSTOP (Orthopedic Implant) ×2 IMPLANT
CORD BIPOLAR FORCEPS 12FT (ELECTRODE) ×3 IMPLANT
COVER BACK TABLE 60X90IN (DRAPES) ×3 IMPLANT
CUFF TOURNIQUET SINGLE 18IN (TOURNIQUET CUFF) IMPLANT
DECANTER SPIKE VIAL GLASS SM (MISCELLANEOUS) IMPLANT
DRAPE EXTREMITY T 121X128X90 (DRAPE) ×3 IMPLANT
DRAPE IMP U-DRAPE 54X76 (DRAPES) ×3 IMPLANT
DRAPE OEC MINIVIEW 54X84 (DRAPES) ×2 IMPLANT
DRAPE SURG 17X23 STRL (DRAPES) ×3 IMPLANT
DRILL PASSING CMC 1/4 FLEX (BIT) ×3
GAUZE 4X4 16PLY RFD (DISPOSABLE) IMPLANT
GAUZE SPONGE 4X4 12PLY STRL (GAUZE/BANDAGES/DRESSINGS) ×3 IMPLANT
GAUZE XEROFORM 1X8 LF (GAUZE/BANDAGES/DRESSINGS) ×3 IMPLANT
GLOVE BIO SURGEON STRL SZ 6.5 (GLOVE) ×2 IMPLANT
GLOVE BIO SURGEONS STRL SZ 6.5 (GLOVE) ×2
GLOVE BIOGEL PI IND STRL 7.0 (GLOVE) ×1 IMPLANT
GLOVE BIOGEL PI INDICATOR 7.0 (GLOVE) ×4
GLOVE ECLIPSE 7.0 STRL STRAW (GLOVE) ×3 IMPLANT
GLOVE SKINSENSE NS SZ7.5 (GLOVE) ×2
GLOVE SKINSENSE STRL SZ7.5 (GLOVE) ×1 IMPLANT
GLOVE SURG SYN 7.5  E (GLOVE) ×4
GLOVE SURG SYN 7.5 E (GLOVE) ×2 IMPLANT
GLOVE SURG SYN 7.5 PF PI (GLOVE) ×2 IMPLANT
GOWN SRG XL LVL 4 BRTHBL STRL (GOWNS) ×1 IMPLANT
GOWN STRL NON-REIN XL LVL4 (GOWNS) ×3
GOWN STRL REIN XL XLG (GOWN DISPOSABLE) ×3 IMPLANT
GOWN STRL REUS W/ TWL LRG LVL3 (GOWN DISPOSABLE) ×1 IMPLANT
GOWN STRL REUS W/ TWL XL LVL3 (GOWN DISPOSABLE) ×1 IMPLANT
GOWN STRL REUS W/TWL LRG LVL3 (GOWN DISPOSABLE) ×3
GOWN STRL REUS W/TWL XL LVL3 (GOWN DISPOSABLE) ×3
NDL HYPO 25X1 1.5 SAFETY (NEEDLE) IMPLANT
NEEDLE HYPO 25X1 1.5 SAFETY (NEEDLE) IMPLANT
NS IRRIG 1000ML POUR BTL (IV SOLUTION) ×3 IMPLANT
PACK BASIN DAY SURGERY FS (CUSTOM PROCEDURE TRAY) ×3 IMPLANT
PAD CAST 3X4 CTTN HI CHSV (CAST SUPPLIES) ×1 IMPLANT
PADDING CAST ABS 3INX4YD NS (CAST SUPPLIES)
PADDING CAST ABS 4INX4YD NS (CAST SUPPLIES) ×2
PADDING CAST ABS COTTON 3X4 (CAST SUPPLIES) IMPLANT
PADDING CAST ABS COTTON 4X4 ST (CAST SUPPLIES) ×1 IMPLANT
PADDING CAST COTTON 3X4 STRL (CAST SUPPLIES) ×3
RUBBERBAND STERILE (MISCELLANEOUS) ×3 IMPLANT
SLEEVE SCD COMPRESS KNEE MED (MISCELLANEOUS) ×3 IMPLANT
SPLINT FIBERGLASS 3X35 (CAST SUPPLIES) ×2 IMPLANT
STOCKINETTE 4X48 STRL (DRAPES) ×3 IMPLANT
SUCTION FRAZIER HANDLE 10FR (MISCELLANEOUS)
SUCTION TUBE FRAZIER 10FR DISP (MISCELLANEOUS) IMPLANT
SUT ETHIBOND 0 MO6 C/R (SUTURE) IMPLANT
SUT ETHILON 4 0 PS 2 18 (SUTURE) ×5 IMPLANT
SUT FIBERWIRE #2 38 T-5 BLUE (SUTURE)
SUT FIBERWIRE 2-0 18 17.9 3/8 (SUTURE)
SUT MNCRL AB 4-0 PS2 18 (SUTURE) IMPLANT
SUT VIC AB 0 SH 27 (SUTURE) IMPLANT
SUT VIC AB 2-0 CT1 27 (SUTURE)
SUT VIC AB 2-0 CT1 TAPERPNT 27 (SUTURE) IMPLANT
SUT VIC AB 2-0 SH 27 (SUTURE) ×6
SUT VIC AB 2-0 SH 27XBRD (SUTURE) ×1 IMPLANT
SUTURE FIBERWR #2 38 T-5 BLUE (SUTURE) IMPLANT
SUTURE FIBERWR 2-0 18 17.9 3/8 (SUTURE) IMPLANT
SUTURE TAPE 1.3 FIBERLOP 20 ST (SUTURE) IMPLANT
SUTURETAPE 1.3 FIBERLOOP 20 ST (SUTURE)
SYR BULB 3OZ (MISCELLANEOUS) ×3 IMPLANT
SYR CONTROL 10ML LL (SYRINGE) IMPLANT
TOWEL GREEN STERILE FF (TOWEL DISPOSABLE) ×6 IMPLANT
TOWEL OR NON WOVEN STRL DISP B (DISPOSABLE) ×3 IMPLANT
TRAY DSU PREP LF (CUSTOM PROCEDURE TRAY) ×3 IMPLANT
TUBE CONNECTING 20'X1/4 (TUBING)
TUBE CONNECTING 20X1/4 (TUBING) IMPLANT
UNDERPAD 30X30 (UNDERPADS AND DIAPERS) ×3 IMPLANT

## 2018-01-27 NOTE — H&P (Signed)
PREOPERATIVE H&P  Chief Complaint: left thumb carpometacarpal osteoarthritis  HPI: [name] is a 57 y.o. female who presents for surgical treatment of left thumb carpometacarpal osteoarthritis.  She denies any changes in medical history.  Past Medical History:  Diagnosis Date  . Acid reflux    takes Zantac and Omeprazole daily  . Anxiety    takes Citaopram daily  . Arthritis    right knee  . Chronic back pain    DDD  . Clotting disorder ([facility])    hx of blood clot following knee scope  . History of blood clots    51yrs ago and in left leg  . History of bronchitis 3+yrs ago  . History of migraine    has been a while since last one  . Hyperlipidemia    takes Atorvastatin daily  . Hypertension    takes Lisinopril and HCTZ daily  . Insomnia    takes Elavil nightly as needed  . Joint pain   . Joint swelling   . Paraesophageal hernia   . Weakness    numbness and tingling in both feet   Past Surgical History:  Procedure Laterality Date  . BREAST BIOPSY Left   . CHONDROPLASTY Right 06/28/2014   Procedure: CHONDROPLASTY;  Surgeon: Marianna Payment, MD;  Location: [facility];  Service: Orthopedics;  Laterality: Right;  . COLONOSCOPY N/A 05/29/2014   Procedure: COLONOSCOPY;  Surgeon: Daneil Dolin, MD;  [facility];  Service: Endoscopy;  Laterality: N/A;  215pm- Pt is working until 12:00 so she can't come any earlier  . ESOPHAGOGASTRODUODENOSCOPY N/A 05/29/2014   Procedure: ESOPHAGOGASTRODUODENOSCOPY (EGD);  Surgeon: Daneil Dolin, MD;  [facility];  Service: Endoscopy;  Laterality: N/A;  . GANGLION CYST EXCISION Left 01/13/2002  . KNEE ARTHROSCOPY WITH MEDIAL MENISECTOMY Right 06/28/2014   Procedure: RIGHT KNEE ARTHROSCOPY WITH PARTIAL MEDIAL MENISCECTOMY AND CHONDROPLASTY;  Surgeon: Marianna Payment, MD;  Location: [facility];  Service: Orthopedics;  Laterality: Right;  . MALONEY DILATION N/A 05/29/2014   Procedure:  Venia Minks DILATION;  Surgeon: Daneil Dolin, MD;  [facility];  Service: Endoscopy;  Laterality: N/A;  . PARTIAL KNEE ARTHROPLASTY Right 09/15/2014   Procedure: RIGHT UNICOMPARTMENTAL KNEE ARTHROPLASTY;  Surgeon: Leandrew Koyanagi, MD;  Location: [facility];  Service: Orthopedics;  Laterality: Right;  . TOTAL KNEE ARTHROPLASTY Left 04/08/2004   Social History   Socioeconomic History  . Marital status: Divorced    Spouse name: Not on file  . Number of children: Not on file  . Years of education: Not on file  . Highest education level: Not on file  Occupational History  . Not on file  Social Needs  . Financial resource strain: Not on file  . Food insecurity:    Worry: Not on file    Inability: Not on file  . Transportation needs:    Medical: Not on file    Non-medical: Not on file  Tobacco Use  . Smoking status: Never Smoker  . Smokeless tobacco: Never Used  Substance and Sexual Activity  . Alcohol use: No    Alcohol/week: 0.0 standard drinks  . Drug use: No  . Sexual activity: Not Currently    Birth control/protection: None  Lifestyle  . Physical activity:    Days per week: 7 days    Minutes per session: 60 min  . Stress: Rather much  Relationships  . Social connections:    Talks  on phone: More than three times a week    Gets together: Once a week    Attends religious service: Never    Active member of club or organization: No    Attends meetings of clubs or organizations: Never    Relationship status: Divorced  Other Topics Concern  . Not on file  Social History Narrative  . Not on file   Family History  Problem Relation Age of Onset  . Cancer Father        lung  . Cancer Sister        breast  . Breast cancer Sister 9  . Cancer Maternal Grandfather        lung  . Cancer Paternal Grandfather        lung  . Diabetes Son   . Colon cancer Neg Hx    Allergies  Allergen Reactions  . Tramadol Itching   Prior to Admission medications   Medication Sig Start  Date End Date Taking? Authorizing Provider  atorvastatin (LIPITOR) 10 MG tablet Take 1 tablet (10 mg total) by mouth daily. 09/22/17  Yes Danford, Valetta Fuller D, NP  diclofenac sodium (VOLTAREN) 1 % GEL Apply 2 g topically 4 (four) times daily as needed. 12/23/17  Yes Aundra Dubin, PA-C  DULoxetine (CYMBALTA) 60 MG capsule Take 1 capsule (60 mg total) by mouth daily. 10/08/17  Yes Danford, Valetta Fuller D, NP  ketoconazole (NIZORAL) 2 % shampoo Apply 1 application topically 2 (two) times a week. 11/16/17  Yes Danford, Valetta Fuller D, NP  lisinopril (PRINIVIL,ZESTRIL) 40 MG tablet TAKE 1 TABLET BY MOUTH ONCE DAILY 01/07/18  Yes Danford, Valetta Fuller D, NP  omeprazole (PRILOSEC) 20 MG capsule Take 1 capsule (20 mg total) by mouth 2 (two) times daily before a meal. 07/30/17  Yes Danford, Katy D, NP  hydrochlorothiazide (HYDRODIURIL) 50 MG tablet Take 1 tablet (50 mg total) by mouth daily. 11/16/17   Danford, Valetta Fuller D, NP     Positive ROS: All other systems have been reviewed and were otherwise negative with the exception of those mentioned in the HPI and as above.  Physical Exam: General: Alert, no acute distress Cardiovascular: No pedal edema Respiratory: No cyanosis, no use of accessory musculature GI: abdomen soft Skin: No lesions in the area of chief complaint Neurologic: Sensation intact distally Psychiatric: Patient is competent for consent with normal mood and affect Lymphatic: no lymphedema  MUSCULOSKELETAL: exam stable  Assessment: left thumb carpometacarpal osteoarthritis  Plan: Plan for Procedure(s): LEFT THUMB ligament reconstruction and tendon interposition  The risks benefits and alternatives were discussed with the patient including but not limited to the risks of nonoperative treatment, versus surgical intervention including infection, bleeding, nerve injury,  blood clots, cardiopulmonary complications, morbidity, mortality, among others, and they were willing to proceed.   Eduard Roux,  MD   01/27/2018 8:20 AM

## 2018-01-27 NOTE — Anesthesia Procedure Notes (Signed)
Procedure Name: MAC Date/Time: 01/27/2018 9:46 AM Performed by: Marrianne Mood, CRNA Pre-anesthesia Checklist: Patient identified, Timeout performed, Emergency Drugs available, Suction available and Patient being monitored Patient Re-evaluated:Patient Re-evaluated prior to induction Oxygen Delivery Method: Simple face mask Preoxygenation: Pre-oxygenation with 100% oxygen

## 2018-01-27 NOTE — Anesthesia Postprocedure Evaluation (Signed)
Anesthesia Post Note  Patient: [name]  Procedure(s) Performed: LEFT THUMB ligament reconstruction and tendon interposition (Left Thumb)     Patient location during evaluation: PACU Anesthesia Type: General Level of consciousness: sedated and patient cooperative Pain management: pain level controlled Vital Signs Assessment: post-procedure vital signs reviewed and stable Respiratory status: spontaneous breathing Cardiovascular status: stable Anesthetic complications: no    Last Vitals:  Vitals:   01/27/18 1200 01/27/18 1230  BP: 117/72 128/81  Pulse: 73 76  Resp: 16 18  Temp:  36.5 C  SpO2: 100% 100%    Last Pain:  Vitals:   01/27/18 1230  TempSrc:   PainSc: 0-No pain                 Nolon Nations

## 2018-01-27 NOTE — Anesthesia Procedure Notes (Signed)
Anesthesia Regional Block: Axillary brachial plexus block   Pre-Anesthetic Checklist: ,, timeout performed, Correct Patient, Correct Site, Correct Laterality, Correct Procedure, Correct Position, site marked, Risks and benefits discussed,  Surgical consent,  Pre-op evaluation,  At surgeon's request and post-op pain management  Laterality: Left  Prep: chloraprep       Needles:  Injection technique: Single-shot  Needle Type: Stimulator Needle - 40     Needle Length: 4cm  Needle Gauge: 22     Additional Needles:   Procedures:,,,, ultrasound used (permanent image in chart),,,,  Narrative:  Start time: 01/27/2018 8:48 AM End time: 01/27/2018 8:53 AM Injection made incrementally with aspirations every 5 mL.  Performed by: Personally  Anesthesiologist: Nolon Nations, MD  Additional Notes: BP cuff, EKG monitors applied. Sedation begun. Nerve location verified with U/S. Anesthetic injected incrementally, slowly , and after neg aspirations under direct u/s guidance. Good perineural spread. Tolerated well.

## 2018-01-27 NOTE — Transfer of Care (Signed)
Immediate Anesthesia Transfer of Care Note  Patient: [name]  Procedure(s) Performed: LEFT THUMB ligament reconstruction and tendon interposition (Left Thumb)  Patient Location: PACU  Anesthesia Type:MAC combined with regional for post-op pain  Level of Consciousness: awake and patient cooperative  Airway & Oxygen Therapy: Patient Spontanous Breathing and Patient connected to face mask oxygen  Post-op Assessment: Report given to RN and Post -op Vital signs reviewed and stable  Post vital signs: Reviewed and stable  Last Vitals:  Vitals Value Taken Time  BP    Temp    Pulse    Resp    SpO2      Last Pain:  Vitals:   01/27/18 0758  TempSrc: Oral  PainSc: 8          Complications: No apparent anesthesia complications

## 2018-01-27 NOTE — Op Note (Addendum)
   DATE OF SURGERY:01/27/2018  PREOPERATIVE DIAGNOSIS: Left thumb CMC arthritis.  POSTOPERATIVE DIAGNOSIS: same.  PROCEDURE:  1. Left thumb carpometacarpal interposition arthroplasty ([id number]); thumb     IMPLANTS: [employer]med Microlink  SURGEON: N. Eduard Roux, MD  ASSIST: Madalyn Rob, PA-C; necessary for the timely completion of procedure and due to complexity of procedure.  ANESTHESIA: regional  TOURNIQUET TIME: 60 mins.  BLOOD LOSS: Minimal.  COMPLICATIONS: None.  PATHOLOGY: None.  TIME OUT: A time out was performed before the procedure started.  INDICATIONS FOR PROCEDURE: The patient presents today for the above mentioned procedure after failing extensive conservative measures.  The risks, benefits, and alternatives to surgery were discussed and the patient elected to proceed with surgery.  DESCRIPTION OF PROCEDURE: A Wagner's incision was made. The superficial radial nerve was identified and protected. Radial artery was exposed and protected. We dissected sharply in between the extensor pollicis brevis and abductor pollicis longus tendon interval. Capsulotomy was performed. Capsular flaps were raised. Fluoroscopy was used to identify the borders of the trapezium.  Trapezium was exposed and removed as a whole by first placing a trapezium pin as a joystick and then using a mcglamery elevator to work around the trapezium to release the soft tissue attachments.   The 2nd [facility] articulation did not exhibit any arthrosis.  I then made a small incision on the ulnar border of the 2nd metacarpal base and blunt dissection was performed to expose the cortex.  I then placed a 1.4 mm K wire from the base of the 1st metacarpal base into the 2nd metacarpal base using mini fluoro.  The K wire was brought out this incision and the tightrope suture was delivered along with it.  The suture button was pulled down onto the 1st metacarpal.  A second suture button was placed on the 2nd metacarpal  side.  The tightrope was then provisionally tensioned and checked under fluoro.  The thumb was taken through a full range of motion.  It also did not subside.  The tightrope was then fully tensioned and tied.  The wound was irrigated. The capsular tissue was repaired using 2-0 Vicryl sutures. Then tourniquet was deflated. Hemostasis achieved. Wounds were irrigated and closed using 4-0 nylon sutures. Sterile dressing applied, hand immobilized in a thumb spica splint. The patient then was transferred to the recovery room in stable condition after all counts were correct.  POSTOPERATIVE PLAN: Return in two weeks for suture removal and application of a thumb spica cast. Four weeks after surgery cast will be removed and switch to a thumb spica brace and start hand therapy.

## 2018-01-27 NOTE — Anesthesia Preprocedure Evaluation (Addendum)
Anesthesia Evaluation  Patient identified by MRN, date of birth, ID band Patient awake    Reviewed: Allergy & Precautions, NPO status , Patient's Chart, lab work & pertinent test results  Airway Mallampati: II  TM Distance: >3 FB Neck ROM: Full    Dental  (+) Teeth Intact, Dental Advisory Given   Pulmonary shortness of breath,    breath sounds clear to auscultation       Cardiovascular hypertension,  Rhythm:Regular Rate:Normal     Neuro/Psych  Neuromuscular disease    GI/Hepatic hiatal hernia, GERD  ,  Endo/Other    Renal/GU      Musculoskeletal  (+) Arthritis , Fibromyalgia -  Abdominal   Peds  Hematology   Anesthesia Other Findings   Reproductive/Obstetrics                             Anesthesia Physical  Anesthesia Plan  ASA: II  Anesthesia Plan: Regional   Post-op Pain Management:    Induction:   PONV Risk Score and Plan: 2 and Ondansetron, Dexamethasone, Midazolam, Propofol infusion and Treatment may vary due to age or medical condition  Airway Management Planned: Simple Face Mask  Additional Equipment:   Intra-op Plan:   Post-operative Plan:   Informed Consent: I have reviewed the patients History and Physical, chart, labs and discussed the procedure including the risks, benefits and alternatives for the proposed anesthesia with the patient or authorized representative who has indicated his/her understanding and acceptance.   Dental advisory given  Plan Discussed with: CRNA  Anesthesia Plan Comments:        Anesthesia Quick Evaluation

## 2018-01-27 NOTE — Discharge Instructions (Signed)
Postoperative instructions: ° °Weightbearing instructions: non weight bearing ° °Dressing instructions: Keep your dressing and/or splint clean and dry at all times.  It will be removed at your first post-operative appointment.  Your stitches and/or staples will be removed at this visit. ° °Incision instructions:  Do not soak your incision for 3 weeks after surgery.  If the incision gets wet, pat dry and do not scrub the incision. ° °Pain control:  You have been given a prescription to be taken as directed for post-operative pain control.  In addition, elevate the operative extremity above the heart at all times to prevent swelling and throbbing pain. ° °Take over-the-counter Colace, 100mg by mouth twice a day while taking narcotic pain medications to help prevent constipation. ° °Follow up appointments: °1) 12-14 days for suture removal and wound check. °2) Dr. Xu as scheduled. ° ° ------------------------------------------------------------------------------------------------------------- ° °After Surgery Pain Control: ° °After your surgery, post-surgical discomfort or pain is likely. This discomfort can last several days to a few weeks. At certain times of the day your discomfort may be more intense.  °Did you receive a nerve block?  °A nerve block can provide pain relief for one hour to two days after your surgery. As long as the nerve block is working, you will experience little or no sensation in the area the surgeon operated on.  °As the nerve block wears off, you will begin to experience pain or discomfort. It is very important that you begin taking your prescribed pain medication before the nerve block fully wears off. Treating your pain at the first sign of the block wearing off will ensure your pain is better controlled and more tolerable when full-sensation returns. Do not wait until the pain is intolerable, as the medicine will be less effective. It is better to treat pain in advance than to try and  catch up.  °General Anesthesia:  °If you did not receive a nerve block during your surgery, you will need to start taking your pain medication shortly after your surgery and should continue to do so as prescribed by your surgeon.  °Pain Medication:  °Most commonly we prescribe Vicodin and Percocet for post-operative pain. Both of these medications contain a combination of acetaminophen (Tylenol®) and a narcotic to help control pain.  °· It takes between 30 and 45 minutes before pain medication starts to work. It is important to take your medication before your pain level gets too intense.  °· Nausea is a common side effect of many pain medications. You will want to eat something before taking your pain medicine to help prevent nausea.  °· If you are taking a prescription pain medication that contains acetaminophen, we recommend that you do not take additional over the counter acetaminophen (Tylenol®).  °Other pain relieving options:  °· Using a cold pack to ice the affected area a few times a day (15 to 20 minutes at a time) can help to relieve pain, reduce swelling and bruising.  °· Elevation of the affected area can also help to reduce pain and swelling. ° ° ° ° °Post Anesthesia Home Care Instructions ° °Activity: °Get plenty of rest for the remainder of the day. A responsible individual must stay with you for 24 hours following the procedure.  °For the next 24 hours, DO NOT: °-Drive a car °-Operate machinery °-Drink alcoholic beverages °-Take any medication unless instructed by your physician °-Make any legal decisions or sign important papers. ° °Meals: °Start with liquid foods such as gelatin   or soup. Progress to regular foods as tolerated. Avoid greasy, spicy, heavy foods. If nausea and/or vomiting occur, drink only clear liquids until the nausea and/or vomiting subsides. Call your physician if vomiting continues. ° °Special Instructions/Symptoms: °Your throat may feel dry or sore from the anesthesia or the  breathing tube placed in your throat during surgery. If this causes discomfort, gargle with warm salt water. The discomfort should disappear within 24 hours. ° °If you had a scopolamine patch placed behind your ear for the management of post- operative nausea and/or vomiting: ° °1. The medication in the patch is effective for 72 hours, after which it should be removed.  Wrap patch in a tissue and discard in the trash. Wash hands thoroughly with soap and water. °2. You may remove the patch earlier than 72 hours if you experience unpleasant side effects which may include dry mouth, dizziness or visual disturbances. °3. Avoid touching the patch. Wash your hands with soap and water after contact with the patch. °   °Regional Anesthesia Blocks ° °1. Numbness or the inability to move the "blocked" extremity may last from 3-48 hours after placement. The length of time depends on the medication injected and your individual response to the medication. If the numbness is not going away after 48 hours, call your surgeon. ° °2. The extremity that is blocked will need to be protected until the numbness is gone and the  Strength has returned. Because you cannot feel it, you will need to take extra care to avoid injury. Because it may be weak, you may have difficulty moving it or using it. You may not know what position it is in without looking at it while the block is in effect. ° °3. For blocks in the legs and feet, returning to weight bearing and walking needs to be done carefully. You will need to wait until the numbness is entirely gone and the strength has returned. You should be able to move your leg and foot normally before you try and bear weight or walk. You will need someone to be with you when you first try to ensure you do not fall and possibly risk injury. ° °4. Bruising and tenderness at the needle site are common side effects and will resolve in a few days. ° °5. Persistent numbness or new problems with movement  should be communicated to the surgeon or the  Surgery Center ([phone])/ [facility] ([phone]). ° °

## 2018-01-27 NOTE — Progress Notes (Signed)
Assisted Dr. Germeroth with left, ultrasound guided, axillary block. Side rails up, monitors on throughout procedure. See vital signs in flow sheet. Tolerated Procedure well. 

## 2018-01-27 NOTE — H&P (View-Only) (Signed)
PREOPERATIVE H&P  Chief Complaint: left thumb carpometacarpal osteoarthritis  HPI: [name] is a 57 y.o. female who presents for surgical treatment of left thumb carpometacarpal osteoarthritis.  She denies any changes in medical history.  Past Medical History:  Diagnosis Date  . Acid reflux    takes Zantac and Omeprazole daily  . Anxiety    takes Citaopram daily  . Arthritis    right knee  . Chronic back pain    DDD  . Clotting disorder (HCC)    hx of blood clot following knee scope  . History of blood clots    43yrs ago and in left leg  . History of bronchitis 3+yrs ago  . History of migraine    has been a while since last one  . Hyperlipidemia    takes Atorvastatin daily  . Hypertension    takes Lisinopril and HCTZ daily  . Insomnia    takes Elavil nightly as needed  . Joint pain   . Joint swelling   . Paraesophageal hernia   . Weakness    numbness and tingling in both feet   Past Surgical History:  Procedure Laterality Date  . BREAST BIOPSY Left   . CHONDROPLASTY Right 06/28/2014   Procedure: CHONDROPLASTY;  Surgeon: Marianna Payment, MD;  Location: [facility];  Service: Orthopedics;  Laterality: Right;  . COLONOSCOPY N/A 05/29/2014   Procedure: COLONOSCOPY;  Surgeon: Daneil Dolin, MD;  [facility];  Service: Endoscopy;  Laterality: N/A;  215pm- Pt is working until 12:00 so she can't come any earlier  . ESOPHAGOGASTRODUODENOSCOPY N/A 05/29/2014   Procedure: ESOPHAGOGASTRODUODENOSCOPY (EGD);  Surgeon: Daneil Dolin, MD;  [facility];  Service: Endoscopy;  Laterality: N/A;  . GANGLION CYST EXCISION Left 01/13/2002  . KNEE ARTHROSCOPY WITH MEDIAL MENISECTOMY Right 06/28/2014   Procedure: RIGHT KNEE ARTHROSCOPY WITH PARTIAL MEDIAL MENISCECTOMY AND CHONDROPLASTY;  Surgeon: Marianna Payment, MD;  Location: [facility];  Service: Orthopedics;  Laterality: Right;  . MALONEY DILATION N/A 05/29/2014   Procedure:  Venia Minks DILATION;  Surgeon: Daneil Dolin, MD;  [facility];  Service: Endoscopy;  Laterality: N/A;  . PARTIAL KNEE ARTHROPLASTY Right 09/15/2014   Procedure: RIGHT UNICOMPARTMENTAL KNEE ARTHROPLASTY;  Surgeon: Leandrew Koyanagi, MD;  Location: [facility];  Service: Orthopedics;  Laterality: Right;  . TOTAL KNEE ARTHROPLASTY Left 04/08/2004   Social History   Socioeconomic History  . Marital status: Divorced    Spouse name: Not on file  . Number of children: Not on file  . Years of education: Not on file  . Highest education level: Not on file  Occupational History  . Not on file  Social Needs  . Financial resource strain: Not on file  . Food insecurity:    Worry: Not on file    Inability: Not on file  . Transportation needs:    Medical: Not on file    Non-medical: Not on file  Tobacco Use  . Smoking status: Never Smoker  . Smokeless tobacco: Never Used  Substance and Sexual Activity  . Alcohol use: No    Alcohol/week: 0.0 standard drinks  . Drug use: No  . Sexual activity: Not Currently    Birth control/protection: None  Lifestyle  . Physical activity:    Days per week: 7 days    Minutes per session: 60 min  . Stress: Rather much  Relationships  . Social connections:    Talks  on phone: More than three times a week    Gets together: Once a week    Attends religious service: Never    Active member of club or organization: No    Attends meetings of clubs or organizations: Never    Relationship status: Divorced  Other Topics Concern  . Not on file  Social History Narrative  . Not on file   Family History  Problem Relation Age of Onset  . Cancer Father        lung  . Cancer Sister        breast  . Breast cancer Sister 35  . Cancer Maternal Grandfather        lung  . Cancer Paternal Grandfather        lung  . Diabetes Son   . Colon cancer Neg Hx    Allergies  Allergen Reactions  . Tramadol Itching   Prior to Admission medications   Medication Sig Start  Date End Date Taking? Authorizing Provider  atorvastatin (LIPITOR) 10 MG tablet Take 1 tablet (10 mg total) by mouth daily. 09/22/17  Yes Danford, Valetta Fuller D, NP  diclofenac sodium (VOLTAREN) 1 % GEL Apply 2 g topically 4 (four) times daily as needed. 12/23/17  Yes Aundra Dubin, PA-C  DULoxetine (CYMBALTA) 60 MG capsule Take 1 capsule (60 mg total) by mouth daily. 10/08/17  Yes Danford, Valetta Fuller D, NP  ketoconazole (NIZORAL) 2 % shampoo Apply 1 application topically 2 (two) times a week. 11/16/17  Yes Danford, Valetta Fuller D, NP  lisinopril (PRINIVIL,ZESTRIL) 40 MG tablet TAKE 1 TABLET BY MOUTH ONCE DAILY 01/07/18  Yes Danford, Valetta Fuller D, NP  omeprazole (PRILOSEC) 20 MG capsule Take 1 capsule (20 mg total) by mouth 2 (two) times daily before a meal. 07/30/17  Yes Danford, Katy D, NP  hydrochlorothiazide (HYDRODIURIL) 50 MG tablet Take 1 tablet (50 mg total) by mouth daily. 11/16/17   Danford, Valetta Fuller D, NP     Positive ROS: All other systems have been reviewed and were otherwise negative with the exception of those mentioned in the HPI and as above.  Physical Exam: General: Alert, no acute distress Cardiovascular: No pedal edema Respiratory: No cyanosis, no use of accessory musculature GI: abdomen soft Skin: No lesions in the area of chief complaint Neurologic: Sensation intact distally Psychiatric: Patient is competent for consent with normal mood and affect Lymphatic: no lymphedema  MUSCULOSKELETAL: exam stable  Assessment: left thumb carpometacarpal osteoarthritis  Plan: Plan for Procedure(s): LEFT THUMB ligament reconstruction and tendon interposition  The risks benefits and alternatives were discussed with the patient including but not limited to the risks of nonoperative treatment, versus surgical intervention including infection, bleeding, nerve injury,  blood clots, cardiopulmonary complications, morbidity, mortality, among others, and they were willing to proceed.   Eduard Roux,  MD   01/27/2018 8:20 AM

## 2018-01-28 ENCOUNTER — Encounter (HOSPITAL_BASED_OUTPATIENT_CLINIC_OR_DEPARTMENT_OTHER): Payer: Self-pay | Admitting: Orthopaedic Surgery

## 2018-01-28 ENCOUNTER — Telehealth (INDEPENDENT_AMBULATORY_CARE_PROVIDER_SITE_OTHER): Payer: Self-pay | Admitting: Orthopaedic Surgery

## 2018-01-28 NOTE — Telephone Encounter (Signed)
Patient returned call to lDr Erlinda Hong stating she's ok just sore. Patient just wanted Dr Erlinda Hong to know she doing ok. The number to contact patient is [phone]

## 2018-01-29 NOTE — Telephone Encounter (Signed)
See message below °

## 2018-02-01 ENCOUNTER — Other Ambulatory Visit: Payer: Self-pay | Admitting: Adult Health

## 2018-02-01 MED FILL — DULoxetine HCL 60 MG CPEP: 60 | 30 days supply | Qty: 30 | Fill #0

## 2018-02-05 ENCOUNTER — Other Ambulatory Visit: Payer: Self-pay | Admitting: Adult Health

## 2018-02-05 ENCOUNTER — Telehealth (INDEPENDENT_AMBULATORY_CARE_PROVIDER_SITE_OTHER): Payer: Self-pay | Admitting: Orthopaedic Surgery

## 2018-02-05 NOTE — Telephone Encounter (Signed)
Patient left a message requesting a call back from you she said to discuss her concerns related to surgery. # [phone]

## 2018-02-05 NOTE — Telephone Encounter (Signed)
Would like Ibuprofen 800 mg. Is this okay if so, instructions or quantity.  States she got some of the dressing wet, will change to a dry dressing. Offered her to come in so that I can change it but she had a friend nurse that will change it this PM.

## 2018-02-07 NOTE — Telephone Encounter (Signed)
800 mg tid prn #60. 3 refills. Left voicemail

## 2018-02-09 ENCOUNTER — Other Ambulatory Visit: Payer: Self-pay

## 2018-02-09 ENCOUNTER — Other Ambulatory Visit (INDEPENDENT_AMBULATORY_CARE_PROVIDER_SITE_OTHER): Payer: Self-pay

## 2018-02-09 MED ORDER — IBUPROFEN 800 MG PO TABS: ORAL_TABLET | ORAL | 3 refills | Status: DC

## 2018-02-09 NOTE — Telephone Encounter (Signed)
Sent in to pharm.

## 2018-02-09 NOTE — Telephone Encounter (Signed)
"  We can use her health and physical form from Dr. Erlinda Hong.  So, all we need is the orders.  I hate to bother you but we don't have his cell to call him directly."  I'll let him know.  "We need the orders in by 4 pm today."

## 2018-02-09 NOTE — Telephone Encounter (Addendum)
"  She is scheduled for pre-admission testing on Thursday.  We need the orders today by 11:30 am.  We need a history and physical form.  Will she be seeing her medical doctor?"  I attempted to call the patient.  I left her a message that her surgery is scheduled to be done at [facility], not the day surgery center.

## 2018-02-10 ENCOUNTER — Inpatient Hospital Stay (HOSPITAL_COMMUNITY): Admission: RE | Admit: 2018-02-10 | Payer: Self-pay | Source: Ambulatory Visit

## 2018-02-10 NOTE — Pre-Procedure Instructions (Signed)
[name]  02/10/2018      [facility] ([facility] [place] [address] [place] ([place]) [place] [address] Phone: [phone] Fax: [phone]    Your procedure is scheduled on October 10th.  Report to [facility] Admitting at 5:30 A.M.  Call this number if you have problems the morning of surgery:  [phone]   Remember:  Do not eat or drink after midnight.     Take these medicines the morning of surgery with A SIP OF WATER    Duloxetine (Cymbalta)      Do not wear jewelry, make-up or nail polish.  Do not wear lotions, powders, or perfumes, or deodorant.  Do not shave 48 hours prior to surgery.  Men may shave face and neck.  Do not bring valuables to the hospital.  [facility] is not responsible for any belongings or valuables.   7 days prior to surgery STOP taking any Aspirin(unless otherwise instructed by your surgeon), Aleve, Naproxen, Ibuprofen, Motrin, Advil, Goody's, BC's, all herbal medications, fish oil, and all vitamins   - Preparing For Surgery  Before surgery, you can play an important role. Because skin is not sterile, your skin needs to be as free of germs as possible. You can reduce the number of germs on your skin by washing with CHG (chlorahexidine gluconate) Soap before surgery.  CHG is an antiseptic cleaner which kills germs and bonds with the skin to continue killing germs even after washing.    Oral Hygiene is also important to reduce your risk of infection.  Remember - BRUSH YOUR TEETH THE MORNING OF SURGERY WITH YOUR REGULAR TOOTHPASTE  Please do not use if you have an allergy to CHG or antibacterial soaps. If your skin becomes reddened/irritated stop using the CHG.  Do not shave (including legs and underarms) for at least 48 hours prior to first CHG shower. It is OK to shave your face.  Please follow these instructions carefully.   1. Shower the NIGHT BEFORE SURGERY and the MORNING OF  SURGERY with CHG.   2. If you chose to wash your hair, wash your hair first as usual with your normal shampoo.  3. After you shampoo, rinse your hair and body thoroughly to remove the shampoo.  4. Use CHG as you would any other liquid soap. You can apply CHG directly to the skin and wash gently with a scrungie or a clean washcloth.   5. Apply the CHG Soap to your body ONLY FROM THE NECK DOWN.  Do not use on open wounds or open sores. Avoid contact with your eyes, ears, mouth and genitals (private parts). Wash Face and genitals (private parts)  with your normal soap.  6. Wash thoroughly, paying special attention to the area where your surgery will be performed.  7. Thoroughly rinse your body with warm water from the neck down.  8. DO NOT shower/wash with your normal soap after using and rinsing off the CHG Soap.  9. Pat yourself dry with a CLEAN TOWEL.  10. Wear CLEAN PAJAMAS to bed the night before surgery, wear comfortable clothes the morning of surgery  11. Place CLEAN SHEETS on your bed the night of your first shower and DO NOT SLEEP WITH PETS.    Day of Surgery:  Do not apply any deodorants/lotions.  Please wear clean clothes to the hospital/surgery center.   Remember to brush your teeth WITH YOUR REGULAR TOOTHPASTE.  Contacts, dentures or bridgework may not be worn into surgery.  Leave your suitcase in the car.  After surgery it may be brought to your room.  For patients admitted to the hospital, discharge time will be determined by your treatment team.  Patients discharged the day of surgery will not be allowed to drive home.    Please read over the following fact sheets that you were given. Coughing and Deep Breathing and Surgical Site Infection Prevention

## 2018-02-11 ENCOUNTER — Ambulatory Visit (INDEPENDENT_AMBULATORY_CARE_PROVIDER_SITE_OTHER): Payer: Self-pay

## 2018-02-11 ENCOUNTER — Encounter (HOSPITAL_COMMUNITY)
Admission: RE | Admit: 2018-02-11 | Discharge: 2018-02-11 | Disposition: A | Discharge status: Home / Self Care | Payer: Self-pay | Source: Ambulatory Visit | Attending: Podiatry | Admitting: Podiatry

## 2018-02-11 ENCOUNTER — Encounter (INDEPENDENT_AMBULATORY_CARE_PROVIDER_SITE_OTHER): Payer: Self-pay | Admitting: Orthopaedic Surgery

## 2018-02-11 ENCOUNTER — Ambulatory Visit (INDEPENDENT_AMBULATORY_CARE_PROVIDER_SITE_OTHER): Payer: Self-pay | Admitting: Physician Assistant

## 2018-02-11 ENCOUNTER — Encounter (HOSPITAL_COMMUNITY): Payer: Self-pay

## 2018-02-11 ENCOUNTER — Other Ambulatory Visit: Payer: Self-pay

## 2018-02-11 DIAGNOSIS — Z01812 Encounter for preprocedural laboratory examination: Secondary | ICD-10-CM | POA: Insufficient documentation

## 2018-02-11 DIAGNOSIS — M189 Osteoarthritis of first carpometacarpal joint, unspecified: Secondary | ICD-10-CM | POA: Insufficient documentation

## 2018-02-11 LAB — BASIC METABOLIC PANEL
ANION GAP: 7 (ref 5–15)
BUN: 13 mg/dL (ref 6–20)
CHLORIDE: 104 mmol/L (ref 98–111)
CO2: 27 mmol/L (ref 22–32)
Calcium: 9.5 mg/dL (ref 8.9–10.3)
Creatinine, Ser: 1.07 mg/dL — ABNORMAL HIGH (ref 0.44–1.00)
GFR, EST NON AFRICAN AMERICAN: 57 mL/min — AB (ref >60)
Glucose, Bld: 100 mg/dL — ABNORMAL HIGH (ref 70–99)
POTASSIUM: 3.4 mmol/L — AB (ref 3.5–5.1)
SODIUM: 138 mmol/L (ref 135–145)

## 2018-02-11 LAB — CBC
HEMATOCRIT: 35.2 % — AB (ref 36.0–46.0)
HEMOGLOBIN: 11.4 g/dL — AB (ref 12.0–15.0)
MCH: 30 pg (ref 26.0–34.0)
MCHC: 32.4 g/dL (ref 30.0–36.0)
MCV: 92.6 fL (ref 78.0–100.0)
Platelets: 446 10*3/uL — ABNORMAL HIGH (ref 150–400)
RBC: 3.8 MIL/uL — AB (ref 3.87–5.11)
RDW: 14.1 % (ref 11.5–15.5)
WBC: 9.6 10*3/uL (ref 4.0–10.5)

## 2018-02-11 NOTE — Progress Notes (Signed)
Post-Op Visit Note   Patient: [name]           Date of Birth: June 08, 1960           MRN: [id number] Visit Date: 02/11/2018 PCP: Esaw Grandchild, NP   Assessment & Plan:  Chief Complaint:  Chief Complaint  Patient presents with  . Left Thumb - Pain, Routine Post Op   Visit Diagnoses:  1. Arthrosis of first carpometacarpal joint     Plan: Patient is a pleasant 57 year old female who presents to our clinic today 15 days status post left thumb ligament reconstruction and tendon interposition and trapeziectomy, date of surgery 01/27/2018.  She has been compliant in her thumb spica splint.  No fevers or chills.  She does note an occasional sharp shooting pain to the first dorsal compartment.  She has been taking ibuprofen for this.  Examination of her left wrist reveals well-healing surgical incisions with nylon sutures in place.  No evidence of infection.  She does have moderate swelling to the left hand.  Today, nylon sutures were removed and Steri-Strips applied.  We will apply a thumb spica cast for the next 2 weeks.  She will continue to elevate for pain and swelling.  She will follow-up with [place] in 2 weeks time where we will transition her into a thumb spica splint and start hand therapy.  She will call with concerns or questions in the meantime.  Follow-Up Instructions: Return in about 2 weeks (around 02/25/2018).   Orders:  Orders Placed This Encounter  Procedures  . XR Hand Complete Left   No orders of the defined types were placed in this encounter.   Imaging: Xr Hand Complete Left  Result Date: 02/11/2018 Stable alignment of the tight rope construct.  Evidence of previous trapeziectomy   PMFS History: Patient Active Problem List   Diagnosis Date Noted  . Arthrosis of first carpometacarpal joint 02/11/2018  . Primary osteoarthritis of first carpometacarpal joint of left hand   . Pain in left hand 12/23/2017  . Healthcare maintenance 11/16/2017  . Tinea versicolor  11/16/2017  . Chronic right shoulder pain 09/02/2017  . Fibromyalgia 08/11/2017  . Family history of diabetes mellitus 06/23/2016  . Other fatigue 06/23/2016  . Insomnia 06/23/2016  . Rheumatoid arthritis ([place]) 06/23/2016  . Nontraumatic incomplete tear of right rotator cuff 04/28/2016  . Impingement syndrome of right shoulder 03/31/2016  . Status post right partial knee replacement 09/15/2014  . Acute medial meniscal injury of knee 06/14/2014  . Status post total knee replacement using cement 06/08/2014  . Primary osteoarthritis of right knee 06/08/2014  . Screening for colon cancer   . Schatzki's ring   . Hiatal hernia   . Dysphagia, pharyngoesophageal phase 05/26/2014  . Encounter for screening colonoscopy 05/26/2014  . Adrenal adenoma 05/01/2014  . Breast lump 05/01/2014  . Metatarsalgia of both feet 01/12/2014  . Bilateral leg pain 01/11/2014  . Bilateral edema of lower extremity 01/11/2014  . Other osteoarthritis of spine, thoracolumbar region 12/26/2013  . Hypercholesteremia 05/19/2013  . Periodic health assessment, general screening, adult 04/13/2013  . GERD (gastroesophageal reflux disease) 04/13/2013  . HTN (hypertension) 04/13/2013  . Dyspnea 04/13/2013   Past Medical History:  Diagnosis Date  . Acid reflux    takes Zantac and Omeprazole daily  . Anxiety    takes Citaopram daily  . Arthritis    right knee  . Chronic back pain    DDD  . Clotting disorder ([place])  hx of blood clot following knee scope  . History of blood clots    28yrs ago and in left leg  . History of bronchitis 3+yrs ago  . History of migraine    has been a while since last one  . Hyperlipidemia    takes Atorvastatin daily  . Hypertension    takes Lisinopril and HCTZ daily  . Insomnia    takes Elavil nightly as needed  . Joint pain   . Joint swelling   . Paraesophageal hernia   . Weakness    numbness and tingling in both feet    Family History  Problem Relation Age of Onset  .  Cancer Father        lung  . Cancer Sister        breast  . Breast cancer Sister 11  . Cancer Maternal Grandfather        lung  . Cancer Paternal Grandfather        lung  . Diabetes Son   . Colon cancer Neg Hx     Past Surgical History:  Procedure Laterality Date  . BREAST BIOPSY Left   . CARPOMETACARPEL SUSPENSION PLASTY Left 01/27/2018   Procedure: LEFT THUMB ligament reconstruction and tendon interposition;  Surgeon: Leandrew Koyanagi, MD;  Location: [place];  Service: Orthopedics;  Laterality: Left;  . CHONDROPLASTY Right 06/28/2014   Procedure: CHONDROPLASTY;  Surgeon: Marianna Payment, MD;  Location: [facility];  Service: Orthopedics;  Laterality: Right;  . COLONOSCOPY N/A 05/29/2014   Procedure: COLONOSCOPY;  Surgeon: Daneil Dolin, MD;  [facility];  Service: Endoscopy;  Laterality: N/A;  215pm- Pt is working until 12:00 so she can't come any earlier  . ESOPHAGOGASTRODUODENOSCOPY N/A 05/29/2014   Procedure: ESOPHAGOGASTRODUODENOSCOPY (EGD);  Surgeon: Daneil Dolin, MD;  [facility];  Service: Endoscopy;  Laterality: N/A;  . GANGLION CYST EXCISION Left 01/13/2002  . KNEE ARTHROSCOPY WITH MEDIAL MENISECTOMY Right 06/28/2014   Procedure: RIGHT KNEE ARTHROSCOPY WITH PARTIAL MEDIAL MENISCECTOMY AND CHONDROPLASTY;  Surgeon: Marianna Payment, MD;  Location: [facility];  Service: Orthopedics;  Laterality: Right;  . MALONEY DILATION N/A 05/29/2014   Procedure: Venia Minks DILATION;  Surgeon: Daneil Dolin, MD;  [facility];  Service: Endoscopy;  Laterality: N/A;  . PARTIAL KNEE ARTHROPLASTY Right 09/15/2014   Procedure: RIGHT UNICOMPARTMENTAL KNEE ARTHROPLASTY;  Surgeon: Leandrew Koyanagi, MD;  Location: [facility];  Service: Orthopedics;  Laterality: Right;  . TOTAL KNEE ARTHROPLASTY Left 04/08/2004   Social History   Occupational History  . Not on file  Tobacco Use  . Smoking status: Never Smoker  . Smokeless  tobacco: Never Used  Substance and Sexual Activity  . Alcohol use: No    Alcohol/week: 0.0 standard drinks  . Drug use: No  . Sexual activity: Not Currently    Birth control/protection: None

## 2018-02-11 NOTE — Progress Notes (Signed)
PCP - Mina Marble NP  EKG - 01/2018  Blood Thinner Instructions: N/A Aspirin Instructions: N/A  Anesthesia review: none  Patient denies shortness of breath, fever, cough and chest pain at PAT appointment   Patient verbalized understanding of instructions that were given to them at the PAT appointment. Patient was also instructed that they will need to review over the PAT instructions again at home before surgery.

## 2018-02-16 ENCOUNTER — Encounter: Payer: Self-pay | Admitting: Adult Health

## 2018-02-18 ENCOUNTER — Other Ambulatory Visit: Payer: Self-pay

## 2018-02-18 ENCOUNTER — Ambulatory Visit (HOSPITAL_COMMUNITY): Payer: Self-pay

## 2018-02-18 ENCOUNTER — Encounter (HOSPITAL_COMMUNITY): Payer: Self-pay | Admitting: General Practice

## 2018-02-18 ENCOUNTER — Ambulatory Visit (HOSPITAL_COMMUNITY): Payer: Self-pay | Admitting: Anesthesiology

## 2018-02-18 ENCOUNTER — Ambulatory Visit (HOSPITAL_COMMUNITY)
Admission: RE | Admit: 2018-02-18 | Discharge: 2018-02-18 | Disposition: A | Discharge status: Home / Self Care | Payer: Self-pay | Source: Ambulatory Visit | Attending: Podiatry | Admitting: Podiatry

## 2018-02-18 ENCOUNTER — Encounter: Payer: Self-pay | Admitting: Podiatry

## 2018-02-18 ENCOUNTER — Encounter (HOSPITAL_COMMUNITY)
Admission: RE | Disposition: A | Discharge status: Home / Self Care | Payer: Self-pay | Source: Ambulatory Visit | Attending: Podiatry

## 2018-02-18 DIAGNOSIS — M21611 Bunion of right foot: Secondary | ICD-10-CM | POA: Insufficient documentation

## 2018-02-18 DIAGNOSIS — M2041 Other hammer toe(s) (acquired), right foot: Secondary | ICD-10-CM

## 2018-02-18 DIAGNOSIS — Z791 Long term (current) use of non-steroidal anti-inflammatories (NSAID): Secondary | ICD-10-CM | POA: Insufficient documentation

## 2018-02-18 DIAGNOSIS — M2042 Other hammer toe(s) (acquired), left foot: Secondary | ICD-10-CM | POA: Insufficient documentation

## 2018-02-18 DIAGNOSIS — Z96653 Presence of artificial knee joint, bilateral: Secondary | ICD-10-CM | POA: Insufficient documentation

## 2018-02-18 DIAGNOSIS — M7752 Other enthesopathy of left foot: Secondary | ICD-10-CM

## 2018-02-18 DIAGNOSIS — M797 Fibromyalgia: Secondary | ICD-10-CM | POA: Insufficient documentation

## 2018-02-18 DIAGNOSIS — D689 Coagulation defect, unspecified: Secondary | ICD-10-CM | POA: Insufficient documentation

## 2018-02-18 DIAGNOSIS — K219 Gastro-esophageal reflux disease without esophagitis: Secondary | ICD-10-CM | POA: Insufficient documentation

## 2018-02-18 DIAGNOSIS — Z79899 Other long term (current) drug therapy: Secondary | ICD-10-CM | POA: Insufficient documentation

## 2018-02-18 DIAGNOSIS — M2011 Hallux valgus (acquired), right foot: Secondary | ICD-10-CM

## 2018-02-18 DIAGNOSIS — E785 Hyperlipidemia, unspecified: Secondary | ICD-10-CM | POA: Insufficient documentation

## 2018-02-18 DIAGNOSIS — Z9889 Other specified postprocedural states: Secondary | ICD-10-CM

## 2018-02-18 DIAGNOSIS — F419 Anxiety disorder, unspecified: Secondary | ICD-10-CM | POA: Insufficient documentation

## 2018-02-18 DIAGNOSIS — G4700 Insomnia, unspecified: Secondary | ICD-10-CM | POA: Insufficient documentation

## 2018-02-18 DIAGNOSIS — M7751 Other enthesopathy of right foot: Secondary | ICD-10-CM

## 2018-02-18 DIAGNOSIS — I10 Essential (primary) hypertension: Secondary | ICD-10-CM | POA: Insufficient documentation

## 2018-02-18 HISTORY — PX: CAPSULOTOMY: SHX379

## 2018-02-18 HISTORY — PX: BUNIONECTOMY: SHX129

## 2018-02-18 HISTORY — PX: HAMMER TOE SURGERY: SHX385

## 2018-02-18 SURGERY — BUNIONECTOMY
Anesthesia: General | Laterality: Right

## 2018-02-18 MED ORDER — LIDOCAINE HCL 2 % IJ SOLN
INTRAMUSCULAR | Status: AC
  Filled 2018-02-18: qty 20

## 2018-02-18 MED ORDER — CEFAZOLIN SODIUM-DEXTROSE 2-4 GM/100ML-% IV SOLN
2.0000 g | INTRAVENOUS | Status: AC
  Administered 2018-02-18: 2 g via INTRAVENOUS

## 2018-02-18 MED ORDER — ONDANSETRON HCL 4 MG/2ML IJ SOLN
INTRAMUSCULAR | Status: AC
  Filled 2018-02-18: qty 2

## 2018-02-18 MED ORDER — BUPIVACAINE HCL (PF) 0.5 % IJ SOLN
INTRAMUSCULAR | Status: AC
  Filled 2018-02-18: qty 30

## 2018-02-18 MED ORDER — CEFAZOLIN SODIUM-DEXTROSE 2-4 GM/100ML-% IV SOLN
INTRAVENOUS | Status: AC
  Filled 2018-02-18: qty 100

## 2018-02-18 MED ORDER — BUPIVACAINE HCL (PF) 0.5 % IJ SOLN
INTRAMUSCULAR | Status: DC | PRN
  Administered 2018-02-18 (×2): 5 mL

## 2018-02-18 MED ORDER — LIDOCAINE HCL (CARDIAC) PF 100 MG/5ML IV SOSY
PREFILLED_SYRINGE | INTRAVENOUS | Status: DC | PRN
  Administered 2018-02-18: 60 mg via INTRAVENOUS

## 2018-02-18 MED ORDER — DEXAMETHASONE SODIUM PHOSPHATE 10 MG/ML IJ SOLN
INTRAMUSCULAR | Status: AC
  Filled 2018-02-18: qty 1

## 2018-02-18 MED ORDER — DEXAMETHASONE SODIUM PHOSPHATE 10 MG/ML IJ SOLN
INTRAMUSCULAR | Status: DC | PRN
  Administered 2018-02-18: 10 mg via INTRAVENOUS

## 2018-02-18 MED ORDER — MIDAZOLAM HCL 2 MG/2ML IJ SOLN
INTRAMUSCULAR | Status: AC
  Filled 2018-02-18: qty 2

## 2018-02-18 MED ORDER — LACTATED RINGERS IV SOLN
INTRAVENOUS | Status: DC | PRN
  Administered 2018-02-18 (×2): via INTRAVENOUS

## 2018-02-18 MED ORDER — CHLORHEXIDINE GLUCONATE 4 % EX LIQD: 60.0000 mL | Freq: Once | CUTANEOUS | Status: DC

## 2018-02-18 MED ORDER — PHENYLEPHRINE 40 MCG/ML (10ML) SYRINGE FOR IV PUSH (FOR BLOOD PRESSURE SUPPORT)
PREFILLED_SYRINGE | INTRAVENOUS | Status: AC
  Filled 2018-02-18: qty 10

## 2018-02-18 MED ORDER — FENTANYL CITRATE (PF) 250 MCG/5ML IJ SOLN
INTRAMUSCULAR | Status: AC
  Filled 2018-02-18: qty 5

## 2018-02-18 MED ORDER — PROPOFOL 10 MG/ML IV BOLUS
INTRAVENOUS | Status: AC
  Filled 2018-02-18: qty 20

## 2018-02-18 MED ORDER — PROMETHAZINE HCL 25 MG/ML IJ SOLN: 6.2500 mg | INTRAMUSCULAR | Status: DC | PRN

## 2018-02-18 MED ORDER — MIDAZOLAM HCL 2 MG/2ML IJ SOLN
INTRAMUSCULAR | Status: DC | PRN
  Administered 2018-02-18: 2 mg via INTRAVENOUS

## 2018-02-18 MED ORDER — PHENYLEPHRINE HCL 10 MG/ML IJ SOLN
INTRAMUSCULAR | Status: DC | PRN
  Administered 2018-02-18 (×2): 80 ug via INTRAVENOUS
  Administered 2018-02-18: 120 ug via INTRAVENOUS
  Administered 2018-02-18: 80 ug via INTRAVENOUS

## 2018-02-18 MED ORDER — ONDANSETRON HCL 4 MG/2ML IJ SOLN
INTRAMUSCULAR | Status: DC | PRN
  Administered 2018-02-18: 4 mg via INTRAVENOUS

## 2018-02-18 MED ORDER — LIDOCAINE HCL 2 % IJ SOLN
INTRAMUSCULAR | Status: DC | PRN
  Administered 2018-02-18 (×2): 5 mL

## 2018-02-18 MED ORDER — HYDROMORPHONE HCL 1 MG/ML IJ SOLN
0.2500 mg | INTRAMUSCULAR | Status: DC | PRN
  Administered 2018-02-18 (×2): 0.5 mg via INTRAVENOUS

## 2018-02-18 MED ORDER — PROPOFOL 10 MG/ML IV BOLUS
INTRAVENOUS | Status: DC | PRN
  Administered 2018-02-18: 200 mg via INTRAVENOUS

## 2018-02-18 MED ORDER — FENTANYL CITRATE (PF) 100 MCG/2ML IJ SOLN
INTRAMUSCULAR | Status: DC | PRN
  Administered 2018-02-18 (×3): 25 ug via INTRAVENOUS

## 2018-02-18 MED ORDER — 0.9 % SODIUM CHLORIDE (POUR BTL) OPTIME
TOPICAL | Status: DC | PRN
  Administered 2018-02-18: 1000 mL

## 2018-02-18 MED ORDER — HYDROMORPHONE HCL 1 MG/ML IJ SOLN
INTRAMUSCULAR | Status: AC
  Filled 2018-02-18: qty 1

## 2018-02-18 MED ORDER — SODIUM CHLORIDE 0.9 % IV SOLN
INTRAVENOUS | Status: DC | PRN
  Administered 2018-02-18: 20 ug/min via INTRAVENOUS

## 2018-02-18 MED ORDER — LIDOCAINE HCL 1 % IJ SOLN
INTRAMUSCULAR | Status: AC
  Filled 2018-02-18: qty 20

## 2018-02-18 SURGICAL SUPPLY — 52 items
BANDAGE ACE 4X5 VEL STRL LF (GAUZE/BANDAGES/DRESSINGS) ×8 IMPLANT
BLADE AVERAGE 25MMX9MM (BLADE) ×1
BLADE AVERAGE 25X9 (BLADE) ×1 IMPLANT
BLADE SURG 15 STRL LF DISP TIS (BLADE) ×4 IMPLANT
BLADE SURG 15 STRL SS (BLADE) ×8
BNDG CMPR 9X4 STRL LF SNTH (GAUZE/BANDAGES/DRESSINGS) ×2
BNDG ESMARK 4X9 LF (GAUZE/BANDAGES/DRESSINGS) ×4 IMPLANT
BNDG GAUZE ELAST 4 BULKY (GAUZE/BANDAGES/DRESSINGS) ×6 IMPLANT
COVER SURGICAL LIGHT HANDLE (MISCELLANEOUS) ×4 IMPLANT
COVER WAND RF STERILE (DRAPES) ×4 IMPLANT
CUFF TOURNIQUET SINGLE 18IN (TOURNIQUET CUFF) ×2 IMPLANT
CUFF TOURNIQUET SINGLE 24IN (TOURNIQUET CUFF) ×2 IMPLANT
DRAPE EXTREMITY BILATERAL (DRAPES) ×2 IMPLANT
DRAPE EXTREMITY T 121X128X90 (DRAPE) ×2 IMPLANT
DRAPE OEC MINIVIEW 54X84 (DRAPES) ×4 IMPLANT
ELECT REM PT RETURN 9FT ADLT (ELECTROSURGICAL) ×4
ELECTRODE REM PT RTRN 9FT ADLT (ELECTROSURGICAL) ×2 IMPLANT
GAUZE SPONGE 4X4 12PLY STRL (GAUZE/BANDAGES/DRESSINGS) ×6 IMPLANT
GAUZE XEROFORM 1X8 LF (GAUZE/BANDAGES/DRESSINGS) ×6 IMPLANT
GLOVE BIO SURGEON STRL SZ8 (GLOVE) ×2 IMPLANT
GLOVE BIOGEL PI IND STRL 8 (GLOVE) IMPLANT
GLOVE BIOGEL PI INDICATOR 8 (GLOVE) ×2
GOWN STRL REUS W/ TWL LRG LVL3 (GOWN DISPOSABLE) ×2 IMPLANT
GOWN STRL REUS W/ TWL XL LVL3 (GOWN DISPOSABLE) ×2 IMPLANT
GOWN STRL REUS W/TWL LRG LVL3 (GOWN DISPOSABLE) ×4
GOWN STRL REUS W/TWL XL LVL3 (GOWN DISPOSABLE) ×4
K-WIRE NON THREAD 1.1 (WIRE) ×8
KIT BASIN OR (CUSTOM PROCEDURE TRAY) ×4 IMPLANT
KWIRE NON THREAD 1.1 (WIRE) IMPLANT
NDL HYPO 25GX1X1/2 BEV (NEEDLE) IMPLANT
NDL SAFETY ECLIPSE 18X1.5 (NEEDLE) IMPLANT
NEEDLE HYPO 18GX1.5 SHARP (NEEDLE)
NEEDLE HYPO 25GX1X1/2 BEV (NEEDLE) ×4 IMPLANT
NS IRRIG 1000ML POUR BTL (IV SOLUTION) IMPLANT
PACK ORTHO EXTREMITY (CUSTOM PROCEDURE TRAY) ×2 IMPLANT
PADDING CAST ABS 4INX4YD NS (CAST SUPPLIES)
PADDING CAST ABS COTTON 4X4 ST (CAST SUPPLIES) ×4 IMPLANT
PIN CAP PROTECTIVE BLK (PIN) ×2 IMPLANT
SCREW CANN 3X15 SHT THR (Screw) ×4 IMPLANT
SCRUB BETADINE 4OZ XXX (MISCELLANEOUS) ×4 IMPLANT
SOL PREP POV-IOD 4OZ 10% (MISCELLANEOUS) ×2 IMPLANT
SPLINT FIBERGLASS 4X30 (CAST SUPPLIES) ×2 IMPLANT
STAPLER VISISTAT (STAPLE) ×2 IMPLANT
STOCKINETTE 6  STRL (DRAPES) ×2
STOCKINETTE 6 STRL (DRAPES) IMPLANT
SUT PROLENE 4 0 PS 2 18 (SUTURE) ×4 IMPLANT
SUT VIC AB 3-0 PS2 18 (SUTURE) ×2 IMPLANT
SUT VIC AB 4-0 PS2 27 (SUTURE) ×2 IMPLANT
SUT VICRYL 4-0 PS2 18IN ABS (SUTURE) ×2 IMPLANT
SYR BULB 3OZ (MISCELLANEOUS) ×4 IMPLANT
SYR CONTROL 10ML LL (SYRINGE) ×2 IMPLANT
UNDERPAD 30X30 (UNDERPADS AND DIAPERS) ×4 IMPLANT

## 2018-02-18 NOTE — Progress Notes (Signed)
Orthopedic Tech Progress Note Patient Details:  [name] 08-06-1960 [id number]  Ortho Devices Type of Ortho Device: Postop shoe/boot, Crutches Ortho Device/Splint Location: Bilateral post op shoes Ortho Device/Splint Interventions: Application   Post Interventions Patient Tolerated: Well Instructions Provided: Care of device   [name] 02/18/2018, 10:07 AM

## 2018-02-18 NOTE — Anesthesia Procedure Notes (Addendum)
Procedure Name: LMA Insertion Date/Time: 02/18/2018 7:35 AM Performed by: Inda Coke, CRNA Pre-anesthesia Checklist: Patient identified, Emergency Drugs available, Suction available and Patient being monitored Patient Re-evaluated:Patient Re-evaluated prior to induction Oxygen Delivery Method: Circle System Utilized Preoxygenation: Pre-oxygenation with 100% oxygen Induction Type: IV induction Ventilation: Mask ventilation without difficulty LMA: LMA inserted LMA Size: 4.0 Number of attempts: 1 Airway Equipment and Method: Bite block Placement Confirmation: positive ETCO2 Tube secured with: Tape Dental Injury: Teeth and Oropharynx as per pre-operative assessment

## 2018-02-18 NOTE — Brief Op Note (Signed)
02/18/2018  9:13 AM  PATIENT:  [name]  57 y.o. female  PRE-OPERATIVE DIAGNOSIS:  HALLUX ABDUCTOR VALGUS AND HAMMERTOE  POST-OPERATIVE DIAGNOSIS:  HALLUX ABDUCTOR VALGUS AND HAMMERTOE  PROCEDURE:  Procedure(s): AUSTION BUNIONECTOMY (Right) HAMMER TOE CORRECTION2ND BILATERAL (Bilateral) CAPSULOTOMY MPJ RELEASE JOINT 2N BILATERAL (Bilateral)  SURGEON:  Surgeon(s) and Role:    Edrick Kins, DPM - Primary  PHYSICIAN ASSISTANT:   ASSISTANTS: none   ANESTHESIA:   local and general  EBL:  minimal   BLOOD ADMINISTERED:none  DRAINS: none   LOCAL MEDICATIONS USED:  MARCAINE    and LIDOCAINE   SPECIMEN:  No Specimen  DISPOSITION OF SPECIMEN:  N/A  COUNTS:  YES  TOURNIQUET:   Total Tourniquet Time Documented: Calf (Right) - 77 minutes Total: Calf (Right) - 77 minutes  Calf (Left) - 18 minutes Total: Calf (Left) - 18 minutes   DICTATION: .Viviann Spare Dictation  PLAN OF CARE: Discharge to home after PACU  PATIENT DISPOSITION:  PACU - hemodynamically stable.   Delay start of Pharmacological VTE agent (>24hrs) due to surgical blood loss or risk of bleeding: not applicable

## 2018-02-18 NOTE — Interval H&P Note (Signed)
History and Physical Interval Note:  02/18/2018 7:26 AM  [name]  has presented today for surgery, with the diagnosis of HALLUX ABDUCTOR VALGUS AND HAMMERTOE  The various methods of treatment have been discussed with the patient and family. After consideration of risks, benefits and other options for treatment, the patient has consented to  Procedure(s): AUSTION BUNIONECTOMY (Right) HAMMER TOE CORRECTION2ND BILATERAL (Bilateral) CAPSULOTOMY MPJ RELEASE JOINT 2N BILATERAL (Bilateral) as a surgical intervention .  The patient's history has been reviewed, patient examined, no change in status, stable for surgery.  I have reviewed the patient's chart and labs.  Questions were answered to the patient's satisfaction.     Edrick Kins

## 2018-02-18 NOTE — Transfer of Care (Signed)
Immediate Anesthesia Transfer of Care Note  Patient: [name]  Procedure(s) Performed: Yehuda Budd (Right ) HAMMER TOE CORRECTION2ND BILATERAL (Bilateral ) CAPSULOTOMY MPJ RELEASE JOINT 2N BILATERAL (Bilateral )  Patient Location: PACU  Anesthesia Type:General  Level of Consciousness: awake and drowsy  Airway & Oxygen Therapy: Patient Spontanous Breathing and Patient connected to nasal cannula oxygen  Post-op Assessment: Report given to RN and Post -op Vital signs reviewed and stable  Post vital signs: Reviewed and stable  Last Vitals:  Vitals Value Taken Time  BP 123/79 02/18/2018  9:20 AM  Temp    Pulse 91 02/18/2018  9:24 AM  Resp 14 02/18/2018  9:24 AM  SpO2 97 % 02/18/2018  9:24 AM  Vitals shown include unvalidated device data.  Last Pain:  Vitals:   02/18/18 0612  TempSrc:   PainSc: 7       Patients Stated Pain Goal: 3 (85/02/77 4128)  Complications: No apparent anesthesia complications

## 2018-02-18 NOTE — Anesthesia Preprocedure Evaluation (Addendum)
Anesthesia Evaluation  Patient identified by MRN, date of birth, ID band Patient awake    Reviewed: Allergy & Precautions, NPO status , Patient's Chart, lab work & pertinent test results  Airway Mallampati: II  TM Distance: >3 FB Neck ROM: Full    Dental no notable dental hx.    Pulmonary neg pulmonary ROS,    Pulmonary exam normal breath sounds clear to auscultation       Cardiovascular hypertension, Pt. on medications Normal cardiovascular exam Rhythm:Regular Rate:Normal  ECG: NSR, rate 76   Neuro/Psych Anxiety negative neurological ROS     GI/Hepatic Neg liver ROS, hiatal hernia, GERD  Medicated and Controlled,  Endo/Other  negative endocrine ROS  Renal/GU negative Renal ROS     Musculoskeletal  (+) Fibromyalgia -Chronic back pain   Abdominal (+) + obese,   Peds  Hematology  (+) anemia , HLD   Anesthesia Other Findings HALLUX ABDUCTOR VALGUS AND HAMMERTOE  Reproductive/Obstetrics                            Anesthesia Physical Anesthesia Plan  ASA: III  Anesthesia Plan: General   Post-op Pain Management:    Induction: Intravenous  PONV Risk Score and Plan: 3 and Midazolam, Dexamethasone, Ondansetron and Treatment may vary due to age or medical condition  Airway Management Planned: LMA  Additional Equipment:   Intra-op Plan:   Post-operative Plan: Extubation in OR  Informed Consent: I have reviewed the patients History and Physical, chart, labs and discussed the procedure including the risks, benefits and alternatives for the proposed anesthesia with the patient or authorized representative who has indicated his/her understanding and acceptance.   Dental advisory given  Plan Discussed with: CRNA  Anesthesia Plan Comments:         Anesthesia Quick Evaluation

## 2018-02-18 NOTE — Anesthesia Postprocedure Evaluation (Signed)
Anesthesia Post Note  Patient: KIELI GOLLADAY  Procedure(s) Performed: Yehuda Budd (Right ) HAMMER TOE CORRECTION2ND BILATERAL (Bilateral ) CAPSULOTOMY MPJ RELEASE JOINT 2N BILATERAL (Bilateral )     Patient location during evaluation: PACU Anesthesia Type: General Level of consciousness: awake and alert Pain management: pain level controlled Vital Signs Assessment: post-procedure vital signs reviewed and stable Respiratory status: spontaneous breathing, nonlabored ventilation, respiratory function stable and patient connected to nasal cannula oxygen Cardiovascular status: blood pressure returned to baseline and stable Postop Assessment: no apparent nausea or vomiting Anesthetic complications: no    Last Vitals:  Vitals:   02/18/18 1040 02/18/18 1050  BP:  111/79  Pulse: 86 87  Resp: 17 16  Temp: (!) 36.3 C   SpO2: 100% 97%    Last Pain:  Vitals:   02/18/18 1050  TempSrc:   PainSc: 4                  Ryan P Ellender

## 2018-02-19 ENCOUNTER — Encounter (HOSPITAL_COMMUNITY): Payer: Self-pay | Admitting: Podiatry

## 2018-02-19 ENCOUNTER — Telehealth: Payer: Self-pay | Admitting: Podiatry

## 2018-02-19 NOTE — Telephone Encounter (Signed)
I called pt, she states there was a mix up on which one of her children were to pick up her pain medication and she did not get her medication until this morning and she thought she was going to die. Pt states she had her 1st Percocet 2 tablets at 11:30am this morning and now has sharp, stabbing, throbbing pain on and off. I told pt that was typical surgery pain, due to bone cutting and the muscles being use to being stretched at a certain tension that had been disrupted by the bone cutting and were now in spasm. I told pt that some times just changing stimuli like the tension of the boot or outer dressing could change the sensation and help. I told pt that she should remove the surgery boot, open-ended sock, ace wrap only, then elevate the feet for 15 minutes, but if the pain worsened dangle the feet for 15 minutes, after 15 minutes either way, place the feet level with the hips and rewrap the ace looser starting at the toes and rolling up the legs, replace the sock, and boot. Pt states the feet feel better a little down and I told her then removal of the boot and ace wraps should help. I told pt she should take ibuprofen 800mg  tid with food in between doses of the Pain medication to help with the pain. Pt states she had some blood at the toes but not on the ace. I told pt if the blood was dry that was fine, it was her blood and it was sterile, it would protect and splint the area.

## 2018-02-19 NOTE — Telephone Encounter (Signed)
I had surgery yesterday with Dr. Amalia Hailey and I need to speak to the nurse. Please call me back at [phone].

## 2018-02-22 ENCOUNTER — Telehealth: Payer: Self-pay | Admitting: Podiatry

## 2018-02-22 NOTE — Telephone Encounter (Signed)
Pt states she can't get comfortable to sleep has more of an ache in the right and worse in the evening. I asked pt if she removed the boot, and ace wrap. Pt states no she had removed the boots. I told pt the trick was removing the ace wrap and elevating for 15 minutes. I explained that possibly the ace was applying pressure to a portion of the surgery site and the removal of the ace may relieve the pain. I told pt not to touch the gauze. I told pt after 15 minutes place foot at hip level and rewrap the ace starting at the toes and wrap looser as she rolls the ace up her leg, then reapply the boot. Pt states understanding. Pt asked if she was to continue alternating the pain medication with the ibuprofen and I told her yes.

## 2018-02-22 NOTE — Telephone Encounter (Signed)
I had surgery with Dr. Amalia Hailey on Thursday and I spoke to the nurse on Friday. She told me if I continued to have pain to call back. I'm still having pain so if the nurse could call me back at [phone].

## 2018-02-24 ENCOUNTER — Ambulatory Visit (INDEPENDENT_AMBULATORY_CARE_PROVIDER_SITE_OTHER): Payer: Self-pay

## 2018-02-24 ENCOUNTER — Ambulatory Visit: Payer: Self-pay

## 2018-02-24 ENCOUNTER — Encounter: Payer: Self-pay | Admitting: Podiatry

## 2018-02-24 ENCOUNTER — Ambulatory Visit (INDEPENDENT_AMBULATORY_CARE_PROVIDER_SITE_OTHER): Payer: No Typology Code available for payment source | Admitting: Podiatry

## 2018-02-24 DIAGNOSIS — M2011 Hallux valgus (acquired), right foot: Secondary | ICD-10-CM

## 2018-02-24 DIAGNOSIS — M21611 Bunion of right foot: Secondary | ICD-10-CM

## 2018-02-24 DIAGNOSIS — M2042 Other hammer toe(s) (acquired), left foot: Secondary | ICD-10-CM

## 2018-02-24 DIAGNOSIS — Z9889 Other specified postprocedural states: Secondary | ICD-10-CM

## 2018-02-24 NOTE — Progress Notes (Signed)
Dg f 

## 2018-02-25 ENCOUNTER — Ambulatory Visit (INDEPENDENT_AMBULATORY_CARE_PROVIDER_SITE_OTHER): Payer: Self-pay | Admitting: Orthopedic Surgery

## 2018-02-25 ENCOUNTER — Encounter (INDEPENDENT_AMBULATORY_CARE_PROVIDER_SITE_OTHER): Payer: Self-pay | Admitting: Orthopaedic Surgery

## 2018-02-25 ENCOUNTER — Ambulatory Visit (INDEPENDENT_AMBULATORY_CARE_PROVIDER_SITE_OTHER): Payer: Self-pay | Admitting: Orthopaedic Surgery

## 2018-02-25 DIAGNOSIS — M189 Osteoarthritis of first carpometacarpal joint, unspecified: Secondary | ICD-10-CM

## 2018-02-25 NOTE — Progress Notes (Signed)
Post-Op Visit Note   Patient: [name]           Date of Birth: 1961/05/01           MRN: [id number] Visit Date: 02/25/2018 PCP: Brandi Grandchild, NP   Assessment & Plan:  Chief Complaint:  Chief Complaint  Patient presents with  . Left Wrist - Pain, Follow-up   Visit Diagnoses:  1. Arthrosis of first carpometacarpal joint     Plan: [name] is 4 weeks status post left thumb CMC arthroplasty.  She reports some stiffness but overall pain is minimal.  She recently had bilateral foot surgery with Dr. Amalia Hailey.  Her surgical incision is fully healed.  No signs of infection.  Minimal swelling.  Neurovascular intact.  Today we remove the thumb spica cast and placed her in a removable thumb spica brace.  We gave her a referral to hand therapy to work on strengthening.  Recheck in 6 weeks.  Follow-Up Instructions: Return in about 6 weeks (around 04/08/2018).   Orders:  No orders of the defined types were placed in this encounter.  No orders of the defined types were placed in this encounter.   Imaging: Dg Foot Complete Left  Result Date: 02/24/2018 Please see detailed radiograph report in office note.  Dg Foot Complete Right  Result Date: 02/24/2018 Please see detailed radiograph report in office note.   PMFS History: Patient Active Problem List   Diagnosis Date Noted  . Arthrosis of first carpometacarpal joint 02/11/2018  . Primary osteoarthritis of first carpometacarpal joint of left hand   . Pain in left hand 12/23/2017  . Healthcare maintenance 11/16/2017  . Tinea versicolor 11/16/2017  . Chronic right shoulder pain 09/02/2017  . Fibromyalgia 08/11/2017  . Family history of diabetes mellitus 06/23/2016  . Other fatigue 06/23/2016  . Insomnia 06/23/2016  . Rheumatoid arthritis ([place]) 06/23/2016  . Nontraumatic incomplete tear of right rotator cuff 04/28/2016  . Impingement syndrome of right shoulder 03/31/2016  . Status post right partial knee replacement 09/15/2014    . Acute medial meniscal injury of knee 06/14/2014  . Status post total knee replacement using cement 06/08/2014  . Primary osteoarthritis of right knee 06/08/2014  . Screening for colon cancer   . Schatzki's ring   . Hiatal hernia   . Dysphagia, pharyngoesophageal phase 05/26/2014  . Encounter for screening colonoscopy 05/26/2014  . Adrenal adenoma 05/01/2014  . Breast lump 05/01/2014  . Metatarsalgia of both feet 01/12/2014  . Bilateral leg pain 01/11/2014  . Bilateral edema of lower extremity 01/11/2014  . Other osteoarthritis of spine, thoracolumbar region 12/26/2013  . Hypercholesteremia 05/19/2013  . Periodic health assessment, general screening, adult 04/13/2013  . GERD (gastroesophageal reflux disease) 04/13/2013  . HTN (hypertension) 04/13/2013  . Dyspnea 04/13/2013   Past Medical History:  Diagnosis Date  . Acid reflux    takes Zantac and Omeprazole daily  . Anxiety    takes Citaopram daily  . Arthritis    right knee  . Chronic back pain    DDD  . Clotting disorder ([facility])    hx of blood clot following knee scope  . History of blood clots    76yrs ago and in left leg  . History of bronchitis 3+yrs ago  . Hyperlipidemia    takes Atorvastatin daily  . Hypertension    takes Lisinopril and HCTZ daily  . Insomnia    takes Elavil nightly as needed  . Joint pain   . Joint swelling   .  Paraesophageal hernia   . Weakness    numbness and tingling in both feet    Family History  Problem Relation Age of Onset  . Cancer Father        lung  . Cancer Sister        breast  . Breast cancer Sister 94  . Cancer Maternal Grandfather        lung  . Cancer Paternal Grandfather        lung  . Diabetes Son   . Colon cancer Neg Hx     Past Surgical History:  Procedure Laterality Date  . BREAST BIOPSY Left   . BUNIONECTOMY Right 02/18/2018   Procedure: Yehuda Budd;  Surgeon: Edrick Kins, DPM;  Location: [facility];  Service: Podiatry;  Laterality: Right;  .  CAPSULOTOMY Bilateral 02/18/2018   Procedure: CAPSULOTOMY MPJ RELEASE JOINT 2N BILATERAL;  Surgeon: Edrick Kins, DPM;  Location: [facility];  Service: Podiatry;  Laterality: Bilateral;  . CARPOMETACARPEL SUSPENSION PLASTY Left 01/27/2018   Procedure: LEFT THUMB ligament reconstruction and tendon interposition;  Surgeon: Leandrew Koyanagi, MD;  Location: [facility];  Service: Orthopedics;  Laterality: Left;  . CHONDROPLASTY Right 06/28/2014   Procedure: CHONDROPLASTY;  Surgeon: Marianna Payment, MD;  Location: [facility];  Service: Orthopedics;  Laterality: Right;  . COLONOSCOPY N/A 05/29/2014   Procedure: COLONOSCOPY;  Surgeon: Daneil Dolin, MD;  [facility];  Service: Endoscopy;  Laterality: N/A;  215pm- Pt is working until 12:00 so she can't come any earlier  . ESOPHAGOGASTRODUODENOSCOPY N/A 05/29/2014   Procedure: ESOPHAGOGASTRODUODENOSCOPY (EGD);  Surgeon: Daneil Dolin, MD;  [facility];  Service: Endoscopy;  Laterality: N/A;  . GANGLION CYST EXCISION Left 01/13/2002  . HAMMER TOE SURGERY Bilateral 02/18/2018   Procedure: HAMMER TOE CORRECTION2ND BILATERAL;  Surgeon: Edrick Kins, DPM;  Location: [facility];  Service: Podiatry;  Laterality: Bilateral;  . KNEE ARTHROSCOPY WITH MEDIAL MENISECTOMY Right 06/28/2014   Procedure: RIGHT KNEE ARTHROSCOPY WITH PARTIAL MEDIAL MENISCECTOMY AND CHONDROPLASTY;  Surgeon: Marianna Payment, MD;  Location: [facility];  Service: Orthopedics;  Laterality: Right;  . MALONEY DILATION N/A 05/29/2014   Procedure: Venia Minks DILATION;  Surgeon: Daneil Dolin, MD;  [facility];  Service: Endoscopy;  Laterality: N/A;  . PARTIAL KNEE ARTHROPLASTY Right 09/15/2014   Procedure: RIGHT UNICOMPARTMENTAL KNEE ARTHROPLASTY;  Surgeon: Leandrew Koyanagi, MD;  Location: [facility];  Service: Orthopedics;  Laterality: Right;  . TOTAL KNEE ARTHROPLASTY Left 04/08/2004   Social History   Occupational History  . Not on  file  Tobacco Use  . Smoking status: Never Smoker  . Smokeless tobacco: Never Used  Substance and Sexual Activity  . Alcohol use: No    Alcohol/week: 0.0 standard drinks  . Drug use: No  . Sexual activity: Not Currently    Birth control/protection: None

## 2018-02-26 ENCOUNTER — Telehealth: Payer: Self-pay | Admitting: Adult Health

## 2018-02-26 NOTE — Telephone Encounter (Signed)
Patient is requesting a call from clinic staff about medication refill issues. She would like a call back from clinic staff about getting meds updated and switched to new pharmacy. She can be reached at [phone].

## 2018-03-01 NOTE — Telephone Encounter (Signed)
Pt called to inform us that she currently cannot afford her blood pressure medications and will call back for refills when she has the money.  Charyl Bigger, CMA

## 2018-03-02 NOTE — Op Note (Signed)
OPERATIVE REPORT Patient name: [name] MRN: [id number] DOB: Sep 23, 1960  DOS:  02/18/2018  Preop Dx: Bunion right.  Hammertoe second digit bilateral. Postop Dx: same  Procedure:  1.  Bunionectomy with metatarsal osteotomy right.  2.  PIPJ arthroplasty with MTPJ capsulotomy second digit bilateral.  Surgeon: Edrick Kins DPM  Anesthesia: 50-50 mixture of 2% lidocaine plain with 0.5% Marcaine plain totaling 20 cc infiltrated in the patient's bilateral lower extremity  Hemostasis: Ankle tourniquet inflated to a pressure of 279mmHg after esmarch exsanguination   EBL: Minimal mL Materials: Synthes 3.0 mm screw x2 Injectables: None Pathology: None  Condition: The patient tolerated the procedure and anesthesia well. No complications noted or reported   Justification for procedure: The patient is a 57 y.o. female who presents today for surgical correction of symptomatic bunion right foot as well as hammertoe deformity bilateral second digits. All conservative modalities of been unsuccessful in providing any sort of satisfactory alleviation of symptoms with the patient. The patient was told benefits as well as possible side effects of the surgery. The patient consented for surgical correction. The patient consent form was reviewed. All patient questions were answered. No guarantees were expressed or implied. The patient and the surgeon boson the patient consent form with the witness present and placed in the patient's chart.   Procedure in Detail: The patient was brought to the operating room, placed in the operating table in the supine position at which time an aseptic scrub and drape were performed about the patient's respective lower extremity after anesthesia was induced as described above. Attention was then directed to the surgical area where procedure number one commenced.  Procedure #1: Bunionectomy with metatarsal osteotomy right foot  5 cm linear longitudinal skin incision  was made over the dorsal medial aspect of the first MTPJ right foot.  The incision was carried down to the level of joint capsule with care taken to cut clamp ligate and retract away all small neurovascular structures traversing the incision site.  An inverted L-shaped capsulotomy was performed and sharp soft tissue dissection was utilized to reflect the way of the joint capsule and periosteum from the underlying bone in preparation for the ensuing osteotomy.  The hypertrophic medial eminence of the first metatarsal head was resected away using a sagittal blade mount on sagittal saw. At this time a chevron type osteotomy was performed with the apex of the osteotomy at the concentric center of the first metatarsal head.  The plantar osteotomy exited the plantar cortex proximal to the sesamoidal apparatus.  The dorsal osteotomy exited the dorsal cortex much more proximal than the plantar cortex in essence creating a long dorsal arm to facilitate better internal fixation.  Capital fragment of the first metatarsal was translocated laterally after a lateral soft tissue release and permanent internal fixation was utilized using two 3.0 mm Synthes screws.  The cortical step-off which had been created due to the translocation of the capital fragment was then sharply resected away using a sagittal blade mount a sagittal saw.  Intraoperative x-ray fluoroscopy was utilized to visualize the reduction of the intermetatarsal angle which was satisfactory.  Orthopedic screws inserted in standard AO fashion.  Copious irrigation was utilized and capsulorrhaphy was performed using 3-0 Vicryl suture followed by 4-0 Vicryl suture to reapproximate subcuticular tissue followed by stainless steel skin staples to reapproximate superficial skin edges.  Procedure #2: PIPJ arthroplasty with MTPJ capsulotomy second digit right foot  A 4 cm linear longitudinal skin incision was  made overlying the second digit of the right foot.  Incision  was carried down to the level of EDL tendon at which time a Z-plasty tenotomy was performed to expose the underlying joints of the toe.  Hypertrophic head of the proximal phalanx was identified and sharp soft tissue dissection was performed to allow exposure of the proximal phalanx head.  An osteotomy was performed at the surgical phalangeal neck of the proximal phalanx and the head was removed in toto.  Attention was then directed to the MTPJ of the second digit right foot where a dorsal capsulotomy was performed.  Temporary internal fixation was utilized using a percutaneous 0.045 inch K wire.  The K wire was driven through the DIPJ PIPJ and MTPJ of the ray of the second digit right foot.  The percutaneous portion of the K wire was bent dorsal at a 90 degree angle with a synthetic ball Placed over the percutaneous portion.  Copious irrigation was utilized in preparation for routine layered soft tissue closure.  The opposing ends of the EDL tendon were reapproximated under normal physiologic tension.  4-0 Vicryl suture was utilized to reapproximate subcuticular tissue followed by stainless steel skin staples to reapproximate superficial skin edges.  Intraoperative x-ray fluoroscopy was utilized to visualize the placement of the percutaneous K wire which was satisfactory.  Procedure #3: PIPJ arthroplasty with MTPJ capsulotomy second digit left foot  Procedure #3 commence in the exact same fashion and manner as procedure #2 with the exception of procedure #3 was performed on the second digit left foot   Dry sterile compressive dressings were then applied to all previously mentioned incision sites about the patient's lower extremity. The tourniquet which was used for hemostasis was deflated. All normal neurovascular responses including pink color and warmth returned all the digits of patient's lower extremity.  The patient was then transferred from the operating room to the recovery room having tolerated the  procedure and anesthesia well. All vital signs are stable. After a brief stay in the recovery room the patient was discharged with adequate prescriptions for analgesia. Verbal as well as written instructions were provided for the patient regarding wound care. The patient is to keep the dressings clean dry and intact until they are to follow surgeon Dr. Daylene Katayama in the office upon discharge.   Edrick Kins, DPM [facility]  Dr. Edrick Kins, [address]                                        [place], [place] [address]                Office [phone]  Fax [phone]

## 2018-03-03 ENCOUNTER — Ambulatory Visit (INDEPENDENT_AMBULATORY_CARE_PROVIDER_SITE_OTHER): Payer: Self-pay | Admitting: Podiatry

## 2018-03-03 DIAGNOSIS — Z9889 Other specified postprocedural states: Secondary | ICD-10-CM

## 2018-03-04 ENCOUNTER — Telehealth (INDEPENDENT_AMBULATORY_CARE_PROVIDER_SITE_OTHER): Payer: Self-pay | Admitting: Orthopaedic Surgery

## 2018-03-04 NOTE — Telephone Encounter (Signed)
Patient said the OT office she was referred to needs the order/referral for exactly what Dr. Erlinda Hong wanted her treated. She said it was the Neurosurgery office on [address] Fax # [phone]   Patients # [phone]

## 2018-03-04 NOTE — Progress Notes (Signed)
   Subjective:  Patient presents today status post bunionectomy right and hammertoe repair 2nd digit bilateral. DOS: 02/18/18. She reports some continued soreness of the right foot. She notes associated stiffness of the foot. She denies any modifying factors. She has been using the post op shoes as directed without issue. Patient is here for further evaluation and treatment.    Past Medical History:  Diagnosis Date  . Acid reflux    takes Zantac and Omeprazole daily  . Anxiety    takes Citaopram daily  . Arthritis    right knee  . Chronic back pain    DDD  . Clotting disorder (HCC)    hx of blood clot following knee scope  . History of blood clots    21yrs ago and in left leg  . History of bronchitis 3+yrs ago  . Hyperlipidemia    takes Atorvastatin daily  . Hypertension    takes Lisinopril and HCTZ daily  . Insomnia    takes Elavil nightly as needed  . Joint pain   . Joint swelling   . Paraesophageal hernia   . Weakness    numbness and tingling in both feet      Objective/Physical Exam Neurovascular status intact.  Skin incisions appear to be well coapted with sutures and staples intact. No sign of infectious process noted. No dehiscence. No active bleeding noted. Moderate edema noted to the surgical extremity.  Radiographic Exam:  Orthopedic hardware and osteotomies sites appear to be stable with routine healing.  Assessment: 1. s/p bunionectomy right and hammertoe repair 2nd digits bilaterally. DOS: 02/18/18.    Plan of Care:  1. Patient was evaluated. X-rays reviewed 2. Dressing changed.  3. Continue weightbearing in post op shoes.  4. Return to clinic in one week.    Edrick Kins, DPM [facility]  Dr. Edrick Kins, [address]                                        [place], [place] [address]                Office [phone]  Fax [phone]

## 2018-03-07 NOTE — Progress Notes (Signed)
   Subjective:  Patient presents today status post bunionectomy right and hammertoe repair 2nd digit bilateral. DOS: 02/18/18. He states he is doing well but has started experiencing some tingling of the right hallux since his last visit. There are no modifying factors noted. He has been using the CAM boot on the right foot and the post op shoe on the left. Patient is here for further evaluation and treatment.    Past Medical History:  Diagnosis Date  . Acid reflux    takes Zantac and Omeprazole daily  . Anxiety    takes Citaopram daily  . Arthritis    right knee  . Chronic back pain    DDD  . Clotting disorder (HCC)    hx of blood clot following knee scope  . History of blood clots    78yrs ago and in left leg  . History of bronchitis 3+yrs ago  . Hyperlipidemia    takes Atorvastatin daily  . Hypertension    takes Lisinopril and HCTZ daily  . Insomnia    takes Elavil nightly as needed  . Joint pain   . Joint swelling   . Paraesophageal hernia   . Weakness    numbness and tingling in both feet      Objective/Physical Exam Neurovascular status intact.  Skin incisions appear to be well coapted with sutures and staples intact. No sign of infectious process noted. No dehiscence. No active bleeding noted. Moderate edema noted to the surgical extremity.  Assessment: 1. s/p bunionectomy right and hammertoe repair 2nd digits bilaterally. DOS: 02/18/18.    Plan of Care:  1. Patient was evaluated. 2. Staples removed. Dry sterile dressing applied.  3. Continue weightbearing in CAM boot on the right foot and post op shoe on the left.  4. Return to clinic in 2 weeks for pin removal.    Edrick Kins, DPM [facility]  Dr. Edrick Kins, [address]                                        [place], [place] [address]                Office [phone]  Fax [phone]

## 2018-03-08 ENCOUNTER — Other Ambulatory Visit (INDEPENDENT_AMBULATORY_CARE_PROVIDER_SITE_OTHER): Payer: Self-pay

## 2018-03-08 DIAGNOSIS — M189 Osteoarthritis of first carpometacarpal joint, unspecified: Secondary | ICD-10-CM

## 2018-03-08 NOTE — Telephone Encounter (Signed)
New referral made

## 2018-03-17 ENCOUNTER — Ambulatory Visit (INDEPENDENT_AMBULATORY_CARE_PROVIDER_SITE_OTHER): Payer: Self-pay

## 2018-03-17 ENCOUNTER — Encounter

## 2018-03-17 ENCOUNTER — Ambulatory Visit (INDEPENDENT_AMBULATORY_CARE_PROVIDER_SITE_OTHER): Payer: Self-pay | Admitting: Podiatry

## 2018-03-17 DIAGNOSIS — M2011 Hallux valgus (acquired), right foot: Secondary | ICD-10-CM

## 2018-03-17 DIAGNOSIS — M2041 Other hammer toe(s) (acquired), right foot: Secondary | ICD-10-CM

## 2018-03-17 DIAGNOSIS — M21611 Bunion of right foot: Secondary | ICD-10-CM

## 2018-03-17 DIAGNOSIS — M2042 Other hammer toe(s) (acquired), left foot: Secondary | ICD-10-CM

## 2018-03-17 DIAGNOSIS — Z9889 Other specified postprocedural states: Secondary | ICD-10-CM

## 2018-03-18 ENCOUNTER — Ambulatory Visit: Payer: Self-pay | Admitting: Occupational Therapy

## 2018-03-21 NOTE — Progress Notes (Signed)
   Subjective:  Patient presents today status post bunionectomy right and hammertoe repair 2nd digit bilateral. DOS: 02/18/18. She states she is doing well overall. She denies any significant pain or modifying factors. She has been using the CAM boot and post op shoe as directed. Patient is here for further evaluation and treatment.    Past Medical History:  Diagnosis Date  . Acid reflux    takes Zantac and Omeprazole daily  . Anxiety    takes Citaopram daily  . Arthritis    right knee  . Chronic back pain    DDD  . Clotting disorder (HCC)    hx of blood clot following knee scope  . History of blood clots    76yrs ago and in left leg  . History of bronchitis 3+yrs ago  . Hyperlipidemia    takes Atorvastatin daily  . Hypertension    takes Lisinopril and HCTZ daily  . Insomnia    takes Elavil nightly as needed  . Joint pain   . Joint swelling   . Paraesophageal hernia   . Weakness    numbness and tingling in both feet      Objective/Physical Exam Neurovascular status intact.  Skin incisions appear to be well coapted. No sign of infectious process noted. No dehiscence. No active bleeding noted. Moderate edema noted to the surgical extremity.  Radiographic Exam:  Orthopedic hardware and osteotomies sites appear to be stable with routine healing.  Assessment: 1. s/p bunionectomy right and hammertoe repair 2nd digits bilaterally. DOS: 02/18/18.    Plan of Care:  1. Patient was evaluated. X-Rays reviewed.  2. Percutaneous pins removed.  3. Compression anklet dispensed for right ankle.  4. Transition into good sneakers.  5. Return to clinic in 4 weeks.    Edrick Kins, DPM [facility]  Dr. Edrick Kins, [place] [address]                                        [place], [place] [address]                Office [phone]  Fax [phone]

## 2018-04-02 ENCOUNTER — Ambulatory Visit (INDEPENDENT_AMBULATORY_CARE_PROVIDER_SITE_OTHER): Payer: Self-pay | Admitting: Orthopaedic Surgery

## 2018-04-19 ENCOUNTER — Encounter: Payer: Self-pay | Admitting: Podiatry

## 2018-04-20 NOTE — Progress Notes (Signed)
This encounter was created in error - please disregard.

## 2018-04-23 NOTE — Progress Notes (Signed)
DOS  02/18/2018  Bunionectomy with metatarsal osteotomy right foot. Hammertoe repair with arthroplasty and capsulotomy second toe bilaterally.

## 2018-04-24 ENCOUNTER — Other Ambulatory Visit: Payer: Self-pay | Admitting: Adult Health

## 2018-04-28 ENCOUNTER — Ambulatory Visit (INDEPENDENT_AMBULATORY_CARE_PROVIDER_SITE_OTHER): Payer: Self-pay

## 2018-04-28 ENCOUNTER — Ambulatory Visit (INDEPENDENT_AMBULATORY_CARE_PROVIDER_SITE_OTHER): Payer: Self-pay | Admitting: Podiatry

## 2018-04-28 DIAGNOSIS — M2042 Other hammer toe(s) (acquired), left foot: Secondary | ICD-10-CM

## 2018-04-28 DIAGNOSIS — M21611 Bunion of right foot: Secondary | ICD-10-CM

## 2018-04-28 DIAGNOSIS — M2011 Hallux valgus (acquired), right foot: Secondary | ICD-10-CM

## 2018-04-28 DIAGNOSIS — Z9889 Other specified postprocedural states: Secondary | ICD-10-CM

## 2018-05-02 NOTE — Progress Notes (Signed)
   Subjective:  Patient presents today status post bunionectomy right and hammertoe repair 2nd digit bilateral. DOS: 02/18/18. She reports pain that has worsened from what it has been. She also notes associated swelling. There are no modifying factors noted. She has been using the compression anklet as directed. Patient is here for further evaluation and treatment.    Past Medical History:  Diagnosis Date  . Acid reflux    takes Zantac and Omeprazole daily  . Anxiety    takes Citaopram daily  . Arthritis    right knee  . Chronic back pain    DDD  . Clotting disorder (HCC)    hx of blood clot following knee scope  . History of blood clots    65yrs ago and in left leg  . History of bronchitis 3+yrs ago  . Hyperlipidemia    takes Atorvastatin daily  . Hypertension    takes Lisinopril and HCTZ daily  . Insomnia    takes Elavil nightly as needed  . Joint pain   . Joint swelling   . Paraesophageal hernia   . Weakness    numbness and tingling in both feet      Objective/Physical Exam Neurovascular status intact.  Skin incisions appear to be well coapted. No sign of infectious process noted. No dehiscence. No active bleeding noted. Moderate edema noted to the surgical extremity.  Radiographic Exam:  Orthopedic hardware and osteotomies sites appear to be stable with routine healing.  Assessment: 1. s/p bunionectomy right and hammertoe repair 2nd digits bilaterally. DOS: 02/18/18.    Plan of Care:  1. Patient was evaluated. X-Rays reviewed.  2. Continue using compression anklet.  3. Continue wearing good sneakers.  4. May resume full activity with no restrictions.  5. Return to clinic as needed.    Edrick Kins, DPM [facility]  Dr. Edrick Kins, [place] [address]                                        [place], [place] [address]                Office [phone]  Fax [phone]

## 2018-06-26 NOTE — Progress Notes (Signed)
Subjective:    Patient ID: [name], female    DOB: 05-28-1960, 58 y.o.   MRN: [id number]  HPI: 01/08/17 OV: [name] is here for f/u: HTN, depression, GERD,insomnia, HLD, and fatigue. She has been compliant on all medications and denies SE.  She has been steadily increasing water and been consuming less caffeine.  She feels that her sleep has improved and has noticed a slight decline in overall fatigue. She was seen/evaluated by podiatry and a surgical plan was agreed upon. She feels that her mood is stable and denies thoughts of harming herself/others and has really enjoyed her new job at [employer].  She continues to avoid EOTH/tobacco.  08/11/17 OV: [name] is here for CPE. She cancelled podiatry surgery due to financial hardships. She reports increased fatigue, that has been worsening > 6 months. She has not seen her Rheumatologist for her RA and also stopped seeing GI specialist due to lack of medical insurance. If she does not take Omeprazole 20mg  BID her GERD "it out of control bad". She feels that her fibromyalgia is not improving.  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  She does not exercise and eats a diet high in CHO. She hydrates with soda pop and drinks very little plain water. She denies tobacco/ETOH use. Healthcare Maintenance: PAP- completed 3/19  Mammogram- completed 3/19 Colonoscopy- due 2026  11/16/17 OV: [name] presents for regular f/u: HTN, depression, GERD, insomnia, HLD, and fibromyalgia She recently quit her high stress job and reports sig reduction in anxiety. She continue to hydrate only with soda or coffee, denies plain water intake. She denies regular exercise, however performs yard/house work daily. She reports itching underneath her breasts that has been steadily worsening for the last few months.  She denies recent travel outside [place], change in laundry detergent, or recently starting taking any new  medications. She reports reduction in overall fibromyalgia sx's   06/29/2018 OV: Visit converted to lab draw only   Patient Care Team    Relationship Specialty Notifications Start End  Esaw Grandchild, NP PCP - General Family Medicine  06/23/16   Daneil Dolin, MD Consulting Physician Gastroenterology  03/08/14   Leandrew Koyanagi, MD Attending Physician Orthopedic Surgery  06/23/16   Hurley Cisco, MD Consulting Physician Rheumatology  06/23/16     Patient Active Problem List   Diagnosis Date Noted  . Arthrosis of first carpometacarpal joint 02/11/2018  . Primary osteoarthritis of first carpometacarpal joint of left hand   . Pain in left hand 12/23/2017  . Healthcare maintenance 11/16/2017  . Tinea versicolor 11/16/2017  . Chronic right shoulder pain 09/02/2017  . Fibromyalgia 08/11/2017  . Family history of diabetes mellitus 06/23/2016  . Other fatigue 06/23/2016  . Insomnia 06/23/2016  . Rheumatoid arthritis ([place]) 06/23/2016  . Nontraumatic incomplete tear of right rotator cuff 04/28/2016  . Impingement syndrome of right shoulder 03/31/2016  . Status post right partial knee replacement 09/15/2014  . Acute medial meniscal injury of knee 06/14/2014  . Status post total knee replacement using cement 06/08/2014  . Primary osteoarthritis of right knee 06/08/2014  . Screening for colon cancer   . Schatzki's ring   . Hiatal hernia   . Dysphagia, pharyngoesophageal phase 05/26/2014  . Encounter for screening colonoscopy 05/26/2014  . Adrenal adenoma 05/01/2014  . Breast lump 05/01/2014  . Metatarsalgia of both feet 01/12/2014  . Bilateral leg pain 01/11/2014  . Bilateral  edema of lower extremity 01/11/2014  . Other osteoarthritis of spine, thoracolumbar region 12/26/2013  . Hypercholesteremia 05/19/2013  . Periodic health assessment, general screening, adult 04/13/2013  . GERD (gastroesophageal reflux disease) 04/13/2013  . HTN (hypertension) 04/13/2013  . Dyspnea 04/13/2013      Past Medical History:  Diagnosis Date  . Acid reflux    takes Zantac and Omeprazole daily  . Anxiety    takes Citaopram daily  . Arthritis    right knee  . Chronic back pain    DDD  . Clotting disorder (HCC)    hx of blood clot following knee scope  . History of blood clots    15yrs ago and in left leg  . History of bronchitis 3+yrs ago  . Hyperlipidemia    takes Atorvastatin daily  . Hypertension    takes Lisinopril and HCTZ daily  . Insomnia    takes Elavil nightly as needed  . Joint pain   . Joint swelling   . Paraesophageal hernia   . Weakness    numbness and tingling in both feet     Past Surgical History:  Procedure Laterality Date  . BREAST BIOPSY Left   . BUNIONECTOMY Right 02/18/2018   Procedure: Yehuda Budd;  Surgeon: Edrick Kins, DPM;  Location: [place];  Service: Podiatry;  Laterality: Right;  . CAPSULOTOMY Bilateral 02/18/2018   Procedure: CAPSULOTOMY MPJ RELEASE JOINT 2N BILATERAL;  Surgeon: Edrick Kins, DPM;  Location: [place];  Service: Podiatry;  Laterality: Bilateral;  . CARPOMETACARPEL SUSPENSION PLASTY Left 01/27/2018   Procedure: LEFT THUMB ligament reconstruction and tendon interposition;  Surgeon: Leandrew Koyanagi, MD;  Location: [place];  Service: Orthopedics;  Laterality: Left;  . CHONDROPLASTY Right 06/28/2014   Procedure: CHONDROPLASTY;  Surgeon: Marianna Payment, MD;  Location: [place];  Service: Orthopedics;  Laterality: Right;  . COLONOSCOPY N/A 05/29/2014   Procedure: COLONOSCOPY;  Surgeon: Daneil Dolin, MD;  [facility];  Service: Endoscopy;  Laterality: N/A;  215pm- Pt is working until 12:00 so she can't come any earlier  . ESOPHAGOGASTRODUODENOSCOPY N/A 05/29/2014   Procedure: ESOPHAGOGASTRODUODENOSCOPY (EGD);  Surgeon: Daneil Dolin, MD;  [facility];  Service: Endoscopy;  Laterality: N/A;  . GANGLION CYST EXCISION Left 01/13/2002  . HAMMER TOE SURGERY Bilateral  02/18/2018   Procedure: HAMMER TOE CORRECTION2ND BILATERAL;  Surgeon: Edrick Kins, DPM;  Location: [place];  Service: Podiatry;  Laterality: Bilateral;  . KNEE ARTHROSCOPY WITH MEDIAL MENISECTOMY Right 06/28/2014   Procedure: RIGHT KNEE ARTHROSCOPY WITH PARTIAL MEDIAL MENISCECTOMY AND CHONDROPLASTY;  Surgeon: Marianna Payment, MD;  Location: [place];  Service: Orthopedics;  Laterality: Right;  . MALONEY DILATION N/A 05/29/2014   Procedure: Venia Minks DILATION;  Surgeon: Daneil Dolin, MD;  [facility];  Service: Endoscopy;  Laterality: N/A;  . PARTIAL KNEE ARTHROPLASTY Right 09/15/2014   Procedure: RIGHT UNICOMPARTMENTAL KNEE ARTHROPLASTY;  Surgeon: Leandrew Koyanagi, MD;  Location: [place];  Service: Orthopedics;  Laterality: Right;  . TOTAL KNEE ARTHROPLASTY Left 04/08/2004     Family History  Problem Relation Age of Onset  . Cancer Father        lung  . Cancer Sister        breast  . Breast cancer Sister 36  . Cancer Maternal Grandfather        lung  . Cancer Paternal Grandfather        lung  .  Diabetes Son   . Colon cancer Neg Hx      Social History   Substance and Sexual Activity  Drug Use No     Social History   Substance and Sexual Activity  Alcohol Use No  . Alcohol/week: 0.0 standard drinks     Social History   Tobacco Use  Smoking Status Never Smoker  Smokeless Tobacco Never Used     Outpatient Encounter Medications as of 06/29/2018  Medication Sig Note  . atorvastatin (LIPITOR) 10 MG tablet TAKE 1 TABLET BY MOUTH ONCE DAILY. PATIENT  MUST  HAVE  OFFICE  VISIT  PRIOR  TO  ANY  FURTHER  REFILLS   . diclofenac sodium (VOLTAREN) 1 % GEL Apply 2 g topically 4 (four) times daily as needed. (Patient taking differently: Apply 2 g topically 4 (four) times daily as needed (pain). )   . DULoxetine (CYMBALTA) 60 MG capsule Take one capsule by mouth daily   . hydrochlorothiazide (HYDRODIURIL) 25 MG tablet Take 50 mg by mouth daily.   .  hydrochlorothiazide (HYDRODIURIL) 50 MG tablet Take 1 tablet (50 mg total) by mouth daily.   Marland Kitchen ibuprofen (ADVIL,MOTRIN) 600 MG tablet Take 600 mg by mouth daily as needed for moderate pain.   Marland Kitchen ibuprofen (ADVIL,MOTRIN) 800 MG tablet TAKE 1 TAB PO tid prn 02/18/2018: Pt states that she has not started this medication  . ketoconazole (NIZORAL) 2 % shampoo Apply 1 application topically 2 (two) times a week. (Patient taking differently: Apply 1 application topically daily as needed for irritation. )   . lisinopril (PRINIVIL,ZESTRIL) 40 MG tablet TAKE 1 TABLET BY MOUTH ONCE DAILY   . omeprazole (PRILOSEC) 20 MG capsule Take 1 capsule (20 mg total) by mouth 2 (two) times daily before a meal.   . ondansetron (ZOFRAN) 4 MG tablet Take 1-2 tablets (4-8 mg total) by mouth every 8 (eight) hours as needed for nausea or vomiting.   Marland Kitchen oxyCODONE-acetaminophen (PERCOCET) 5-325 MG tablet Take 1-2 tablets by mouth every 6 (six) hours as needed for severe pain.   . promethazine (PHENERGAN) 25 MG tablet Take 1 tablet (25 mg total) by mouth every 6 (six) hours as needed for nausea.   Marland Kitchen senna-docusate (SENOKOT S) 8.6-50 MG tablet Take 1 tablet by mouth at bedtime as needed.    No facility-administered encounter medications on file as of 06/29/2018.     Allergies: Tramadol  There is no height or weight on file to calculate BMI.  Last menstrual period 02/07/2013.  Review of Systems  Constitutional: Negative for activity change, appetite change, chills, diaphoresis, fatigue, fever and unexpected weight change.  HENT: Negative for congestion.   Eyes: Negative for visual disturbance.  Respiratory: Negative for cough, chest tightness, shortness of breath, wheezing and stridor.   Cardiovascular: Negative for chest pain, palpitations and leg swelling.  Gastrointestinal: Negative for abdominal distention, abdominal pain, blood in stool, constipation, diarrhea, nausea and vomiting.  Endocrine: Negative for cold  intolerance, heat intolerance, polydipsia, polyphagia and polyuria.  Genitourinary: Negative for difficulty urinating and flank pain.  Musculoskeletal: Negative for arthralgias, back pain, gait problem, joint swelling, myalgias, neck pain and neck stiffness.  Skin: Positive for rash. Negative for color change, pallor and wound.  Neurological: Negative for dizziness and headaches.  Hematological: Does not bruise/bleed easily.  Psychiatric/Behavioral: Positive for sleep disturbance. Negative for decreased concentration, dysphoric mood, hallucinations, self-injury and suicidal ideas. The patient is not nervous/anxious and is not hyperactive.       Objective:  Physical Exam  Constitutional: She is oriented to person, place, and time. She appears well-developed and well-nourished. No distress.  HENT:  Head: Normocephalic and atraumatic.  Right Ear: Tympanic membrane, external ear and ear canal normal. Tympanic membrane is not erythematous and not bulging. No decreased hearing is noted.  Left Ear: Hearing, tympanic membrane, external ear and ear canal normal. Tympanic membrane is not erythematous and not bulging. No decreased hearing is noted.  Nose: Nose normal. No mucosal edema or rhinorrhea. Right sinus exhibits no frontal sinus tenderness. Left sinus exhibits no maxillary sinus tenderness and no frontal sinus tenderness.  Mouth/Throat: Oropharynx is clear and moist and mucous membranes are normal. Abnormal dentition. No oropharyngeal exudate, posterior oropharyngeal edema, posterior oropharyngeal erythema or tonsillar abscesses.  Eyes: Pupils are equal, round, and reactive to light. Conjunctivae are normal.  Neck: Normal range of motion. Neck supple.  Cardiovascular: Normal rate, regular rhythm, normal heart sounds and intact distal pulses.  No murmur heard. Pulmonary/Chest: Effort normal and breath sounds normal. No respiratory distress. She has no wheezes. She has no rales. She exhibits no  tenderness.  Abdominal: There is no guarding.  Lymphadenopathy:    She has no cervical adenopathy.  Neurological: She is alert and oriented to person, place, and time.  Skin: Skin is warm, dry and intact. No rash noted. She is not diaphoretic. No erythema. No pallor.  Psychiatric: She has a normal mood and affect. Her behavior is normal. Judgment and thought content normal.  Nursing note and vitals reviewed.     Assessment & Plan:   No diagnosis found.  No problem-specific Assessment & Plan notes found for this encounter.  FOLLOW-UP:  No follow-ups on file.

## 2018-06-29 ENCOUNTER — Ambulatory Visit (INDEPENDENT_AMBULATORY_CARE_PROVIDER_SITE_OTHER): Payer: Self-pay | Admitting: Adult Health

## 2018-06-29 ENCOUNTER — Encounter: Payer: Self-pay | Admitting: Adult Health

## 2018-06-29 ENCOUNTER — Other Ambulatory Visit: Payer: Self-pay

## 2018-06-29 DIAGNOSIS — E7800 Pure hypercholesterolemia, unspecified: Secondary | ICD-10-CM

## 2018-06-29 DIAGNOSIS — I10 Essential (primary) hypertension: Secondary | ICD-10-CM

## 2018-06-29 DIAGNOSIS — Z0000 Encounter for general adult medical examination without abnormal findings: Secondary | ICD-10-CM

## 2018-06-29 DIAGNOSIS — M79676 Pain in unspecified toe(s): Secondary | ICD-10-CM

## 2018-06-29 NOTE — Progress Notes (Signed)
Subjective:    Patient ID: [name], female    DOB: 1960/07/13, 58 y.o.   MRN: [id number]  HPI: 01/08/17 OV: [name] is here for f/u: HTN, depression, GERD,insomnia, HLD, and fatigue. She has been compliant on all medications and denies SE.  She has been steadily increasing water and been consuming less caffeine.  She feels that her sleep has improved and has noticed a slight decline in overall fatigue. She was seen/evaluated by podiatry and a surgical plan was agreed upon. She feels that her mood is stable and denies thoughts of harming herself/others and has really enjoyed her new job at [employer].  She continues to avoid EOTH/tobacco.  08/11/17 OV: [name] is here for CPE. She cancelled podiatry surgery due to financial hardships. She reports increased fatigue, that has been worsening > 6 months. She has not seen her Rheumatologist for her RA and also stopped seeing GI specialist due to lack of medical insurance. If she does not take Omeprazole 20mg  BID her GERD "it out of control bad". She feels that her fibromyalgia is not improving.  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  She does not exercise and eats a diet high in CHO. She hydrates with soda pop and drinks very little plain water. She denies tobacco/ETOH use. Healthcare Maintenance: PAP- completed 3/19  Mammogram- completed 3/19 Colonoscopy- due 2026  11/16/17 OV: [name] presents for regular f/u: HTN, depression, GERD, insomnia, HLD, and fibromyalgia She recently quit her high stress job and reports sig reduction in anxiety. She continue to hydrate only with soda or coffee, denies plain water intake. She denies regular exercise, however performs yard/house work daily. She reports itching underneath her breasts that has been steadily worsening for the last few months.  She denies recent travel outside [place], change in laundry detergent, or recently starting taking any new  medications. She reports reduction in overall fibromyalgia sx's  06/30/2018 OV: [name] presents for f/u: HTN, depression, GERD, insomnia, HLD, and fibromyalgia She reports persistent pain in neck/shoulder/arms/lower extremities She reports RLS at night  She reports anxiety r/t to pain and financial issues She is only able to work 25 hrs/week due to actively applying for disability  Reviewed recent labs- A1c-6.0 The 10-year ASCVD risk score Mikey Bussing DC Brooke Bonito., et al., 2013) is: 2.4%   Values used to calculate the score:     Age: 62 years     Sex: Female     Is Non-Hispanic African American: No     Diabetic: No     Tobacco smoker: No     Systolic Blood Pressure: 086 mmHg     Is BP treated: Yes     HDL Cholesterol: 57 mg/dL     Total Cholesterol: 236 mg/dL  LDL-155 CBC- H/H has been cycling between 10.5/31/3- 12.4/38.1 the last 3 years- denies hematuria/hematochezia Colonoscopy 05/2014- grossly normal, however inadequate prep and advised to have screening c-scope repeated in one year- she has not. IFOBT provided today and advised her to f/u with GI   Patient Care Team    Relationship Specialty Notifications Start End  Mina Marble D, NP PCP - General Family Medicine  06/23/16   Daneil Dolin, MD Consulting Physician Gastroenterology  03/08/14   Leandrew Koyanagi, MD Attending Physician Orthopedic Surgery  06/23/16   Hurley Cisco, MD Consulting Physician Rheumatology  06/23/16     Patient Active Problem List   Diagnosis Date Noted  .  Restless legs syndrome (RLS) 06/30/2018  . Arthrosis of first carpometacarpal joint 02/11/2018  . Primary osteoarthritis of first carpometacarpal joint of left hand   . Pain in left hand 12/23/2017  . Healthcare maintenance 11/16/2017  . Tinea versicolor 11/16/2017  . Chronic right shoulder pain 09/02/2017  . Fibromyalgia 08/11/2017  . Family history of diabetes mellitus 06/23/2016  . Other fatigue 06/23/2016  . Insomnia 06/23/2016  . Rheumatoid  arthritis ([place]) 06/23/2016  . Nontraumatic incomplete tear of right rotator cuff 04/28/2016  . Impingement syndrome of right shoulder 03/31/2016  . Status post right partial knee replacement 09/15/2014  . Acute medial meniscal injury of knee 06/14/2014  . Status post total knee replacement using cement 06/08/2014  . Primary osteoarthritis of right knee 06/08/2014  . Screening for colon cancer   . Schatzki's ring   . Hiatal hernia   . Dysphagia, pharyngoesophageal phase 05/26/2014  . Encounter for screening colonoscopy 05/26/2014  . Adrenal adenoma 05/01/2014  . Breast lump 05/01/2014  . Metatarsalgia of both feet 01/12/2014  . Bilateral leg pain 01/11/2014  . Bilateral edema of lower extremity 01/11/2014  . Other osteoarthritis of spine, thoracolumbar region 12/26/2013  . Hypercholesteremia 05/19/2013  . Periodic health assessment, general screening, adult 04/13/2013  . GERD (gastroesophageal reflux disease) 04/13/2013  . HTN (hypertension) 04/13/2013  . Dyspnea 04/13/2013     Past Medical History:  Diagnosis Date  . Acid reflux    takes Zantac and Omeprazole daily  . Anxiety    takes Citaopram daily  . Arthritis    right knee  . Chronic back pain    DDD  . Clotting disorder (HCC)    hx of blood clot following knee scope  . History of blood clots    66yrs ago and in left leg  . History of bronchitis 3+yrs ago  . Hyperlipidemia    takes Atorvastatin daily  . Hypertension    takes Lisinopril and HCTZ daily  . Insomnia    takes Elavil nightly as needed  . Joint pain   . Joint swelling   . Paraesophageal hernia   . Weakness    numbness and tingling in both feet     Past Surgical History:  Procedure Laterality Date  . BREAST BIOPSY Left   . BUNIONECTOMY Right 02/18/2018   Procedure: Yehuda Budd;  Surgeon: Edrick Kins, DPM;  Location: [facility];  Service: Podiatry;  Laterality: Right;  . CAPSULOTOMY Bilateral 02/18/2018   Procedure: CAPSULOTOMY MPJ  RELEASE JOINT 2N BILATERAL;  Surgeon: Edrick Kins, DPM;  Location: [place];  Service: Podiatry;  Laterality: Bilateral;  . CARPOMETACARPEL SUSPENSION PLASTY Left 01/27/2018   Procedure: LEFT THUMB ligament reconstruction and tendon interposition;  Surgeon: Leandrew Koyanagi, MD;  Location: [place];  Service: Orthopedics;  Laterality: Left;  . CHONDROPLASTY Right 06/28/2014   Procedure: CHONDROPLASTY;  Surgeon: Marianna Payment, MD;  Location: [place];  Service: Orthopedics;  Laterality: Right;  . COLONOSCOPY N/A 05/29/2014   Procedure: COLONOSCOPY;  Surgeon: Daneil Dolin, MD;  [facility];  Service: Endoscopy;  Laterality: N/A;  215pm- Pt is working until 12:00 so she can't come any earlier  . ESOPHAGOGASTRODUODENOSCOPY N/A 05/29/2014   Procedure: ESOPHAGOGASTRODUODENOSCOPY (EGD);  Surgeon: Daneil Dolin, MD;  [facility];  Service: Endoscopy;  Laterality: N/A;  . GANGLION CYST EXCISION Left 01/13/2002  . HAMMER TOE SURGERY Bilateral 02/18/2018   Procedure: HAMMER TOE CORRECTION2ND BILATERAL;  Surgeon: Daylene Katayama  M, DPM;  Location: [facility];  Service: Podiatry;  Laterality: Bilateral;  . KNEE ARTHROSCOPY WITH MEDIAL MENISECTOMY Right 06/28/2014   Procedure: RIGHT KNEE ARTHROSCOPY WITH PARTIAL MEDIAL MENISCECTOMY AND CHONDROPLASTY;  Surgeon: Marianna Payment, MD;  Location: [place];  Service: Orthopedics;  Laterality: Right;  . MALONEY DILATION N/A 05/29/2014   Procedure: Venia Minks DILATION;  Surgeon: Daneil Dolin, MD;  [facility];  Service: Endoscopy;  Laterality: N/A;  . PARTIAL KNEE ARTHROPLASTY Right 09/15/2014   Procedure: RIGHT UNICOMPARTMENTAL KNEE ARTHROPLASTY;  Surgeon: Leandrew Koyanagi, MD;  Location: [place];  Service: Orthopedics;  Laterality: Right;  . TOTAL KNEE ARTHROPLASTY Left 04/08/2004     Family History  Problem Relation Age of Onset  . Cancer Father        lung  . Cancer Sister        breast   . Breast cancer Sister 4  . Cancer Maternal Grandfather        lung  . Cancer Paternal Grandfather        lung  . Diabetes Son   . Colon cancer Neg Hx      Social History   Substance and Sexual Activity  Drug Use No     Social History   Substance and Sexual Activity  Alcohol Use No  . Alcohol/week: 0.0 standard drinks     Social History   Tobacco Use  Smoking Status Never Smoker  Smokeless Tobacco Never Used     Outpatient Encounter Medications as of 06/30/2018  Medication Sig Note  . atorvastatin (LIPITOR) 10 MG tablet TAKE 1 TABLET BY MOUTH ONCE DAILY. PATIENT  MUST  HAVE  OFFICE  VISIT  PRIOR  TO  ANY  FURTHER  REFILLS   . diclofenac sodium (VOLTAREN) 1 % GEL Apply 2 g topically 4 (four) times daily as needed. (Patient taking differently: Apply 2 g topically 4 (four) times daily as needed (pain). )   . DULoxetine (CYMBALTA) 60 MG capsule Take one capsule by mouth daily   . hydrochlorothiazide (HYDRODIURIL) 50 MG tablet Take 1 tablet (50 mg total) by mouth daily.   Marland Kitchen ibuprofen (ADVIL,MOTRIN) 600 MG tablet Take 600 mg by mouth daily as needed for moderate pain.   Marland Kitchen ketoconazole (NIZORAL) 2 % shampoo Apply 1 application topically 2 (two) times a week. (Patient taking differently: Apply 1 application topically daily as needed for irritation. )   . lisinopril (PRINIVIL,ZESTRIL) 40 MG tablet TAKE 1 TABLET BY MOUTH ONCE DAILY   . omeprazole (PRILOSEC) 20 MG capsule Take 1 capsule (20 mg total) by mouth 2 (two) times daily before a meal.   . gabapentin (NEURONTIN) 100 MG capsule Week one- 1 capsule at bedtime, week two- 2 capsules at bedtimes, week 3- 3 capsules at bedtime   . [DISCONTINUED] hydrochlorothiazide (HYDRODIURIL) 25 MG tablet Take 50 mg by mouth daily.   . [DISCONTINUED] ibuprofen (ADVIL,MOTRIN) 800 MG tablet TAKE 1 TAB PO tid prn 02/18/2018: Pt states that she has not started this medication  . [DISCONTINUED] ondansetron (ZOFRAN) 4 MG tablet Take 1-2 tablets  (4-8 mg total) by mouth every 8 (eight) hours as needed for nausea or vomiting.   . [DISCONTINUED] oxyCODONE-acetaminophen (PERCOCET) 5-325 MG tablet Take 1-2 tablets by mouth every 6 (six) hours as needed for severe pain.   . [DISCONTINUED] promethazine (PHENERGAN) 25 MG tablet Take 1 tablet (25 mg total) by mouth every 6 (six) hours as needed for nausea.   . [DISCONTINUED] senna-docusate (  SENOKOT S) 8.6-50 MG tablet Take 1 tablet by mouth at bedtime as needed.    No facility-administered encounter medications on file as of 06/30/2018.     Allergies: Tramadol  Body mass index is 36.63 kg/m.  Blood pressure 107/71, pulse 84, temperature 98.3 F (36.8 C), temperature source Oral, height 5\' 4"  (1.626 m), weight 213 lb 6.4 oz (96.8 kg), last menstrual period 02/07/2013, SpO2 99 %.  Review of Systems  Constitutional: Negative for activity change, appetite change, chills, diaphoresis, fatigue, fever and unexpected weight change.  HENT: Negative for congestion.   Eyes: Negative for visual disturbance.  Respiratory: Negative for cough, chest tightness, shortness of breath, wheezing and stridor.   Cardiovascular: Negative for chest pain, palpitations and leg swelling.  Gastrointestinal: Negative for abdominal distention, abdominal pain, blood in stool, constipation, diarrhea, nausea and vomiting.  Endocrine: Negative for cold intolerance, heat intolerance, polydipsia, polyphagia and polyuria.  Genitourinary: Negative for difficulty urinating and flank pain.  Musculoskeletal: Negative for arthralgias, back pain, gait problem, joint swelling, myalgias, neck pain and neck stiffness.  Skin: Positive for rash. Negative for color change, pallor and wound.  Neurological: Negative for dizziness and headaches.  Hematological: Does not bruise/bleed easily.  Psychiatric/Behavioral: Positive for sleep disturbance. Negative for decreased concentration, dysphoric mood, hallucinations, self-injury and suicidal  ideas. The patient is not nervous/anxious and is not hyperactive.       Objective:   Physical Exam  Constitutional: She is oriented to person, place, and time. She appears well-developed and well-nourished. No distress.  HENT:  Head: Normocephalic and atraumatic.  Right Ear: Tympanic membrane, external ear and ear canal normal. Tympanic membrane is not erythematous and not bulging. No decreased hearing is noted.  Left Ear: Hearing, tympanic membrane, external ear and ear canal normal. Tympanic membrane is not erythematous and not bulging. No decreased hearing is noted.  Nose: Nose normal. No mucosal edema or rhinorrhea. Right sinus exhibits no frontal sinus tenderness. Left sinus exhibits no maxillary sinus tenderness and no frontal sinus tenderness.  Mouth/Throat: Oropharynx is clear and moist and mucous membranes are normal. Abnormal dentition. No oropharyngeal exudate, posterior oropharyngeal edema, posterior oropharyngeal erythema or tonsillar abscesses.  Eyes: Pupils are equal, round, and reactive to light. Conjunctivae and EOM are normal.  Cardiovascular: Normal rate, regular rhythm, normal heart sounds and intact distal pulses.  No murmur heard. Pulmonary/Chest: Effort normal and breath sounds normal. No respiratory distress. She has no wheezes. She has no rales. She exhibits no tenderness.  Musculoskeletal:        General: Tenderness present.     Right shoulder: She exhibits tenderness.     Left shoulder: She exhibits tenderness.     Right ankle: Tenderness.     Left ankle: Tenderness.     Cervical back: She exhibits tenderness.  Neurological: She is alert and oriented to person, place, and time.  Skin: Skin is warm, dry and intact. She is not diaphoretic. No pallor.  Psychiatric: Her speech is normal and behavior is normal. Judgment and thought content normal. Her mood appears anxious. Cognition and memory are normal.  Tearful at times   Nursing note and vitals reviewed.      Assessment & Plan:   1. Healthcare maintenance   2. Fibromyalgia   3. Restless legs syndrome (RLS)     Healthcare maintenance Continue all medications as directed. Please start Gabapentin taper as directed. Remain well hydrated and follow Mediterranean diet. Please complete IFOBT and return to clinic. Please follow-up with Gastroenterology ASAP,  re: repeat screening colonoscopy. Follow-up 6 months for complete physical.  Fibromyalgia Gabapentin 100mg  QHS for one week, then 200mg  for week, then 300mg  QHS- hold at this dose  Restless legs syndrome (RLS) Gabapentin 100mg  QHS for one week, then 200mg  for week, then 300mg  QHS- hold at this dose  FOLLOW-UP:  Return in about 6 months (around 12/29/2018) for CPE.

## 2018-06-29 NOTE — Progress Notes (Signed)
cbc

## 2018-06-30 ENCOUNTER — Ambulatory Visit (INDEPENDENT_AMBULATORY_CARE_PROVIDER_SITE_OTHER): Payer: Self-pay | Admitting: Adult Health

## 2018-06-30 ENCOUNTER — Encounter: Payer: Self-pay | Admitting: Adult Health

## 2018-06-30 VITALS — BP 107/71 | HR 84 | Temp 98.3°F | Ht 64.0 in | Wt 213.4 lb

## 2018-06-30 DIAGNOSIS — Z0000 Encounter for general adult medical examination without abnormal findings: Secondary | ICD-10-CM

## 2018-06-30 DIAGNOSIS — M797 Fibromyalgia: Secondary | ICD-10-CM

## 2018-06-30 DIAGNOSIS — G2581 Restless legs syndrome: Secondary | ICD-10-CM

## 2018-06-30 LAB — COMPREHENSIVE METABOLIC PANEL
ALT: 12 IU/L (ref 0–32)
AST: 19 IU/L (ref 0–40)
Albumin/Globulin Ratio: 1.7 (ref 1.2–2.2)
Albumin: 4 g/dL (ref 3.8–4.9)
Alkaline Phosphatase: 108 IU/L (ref 39–117)
BUN/Creatinine Ratio: 19 (ref 9–23)
BUN: 19 mg/dL (ref 6–24)
Bilirubin Total: 0.3 mg/dL (ref 0.0–1.2)
CHLORIDE: 101 mmol/L (ref 96–106)
CO2: 24 mmol/L (ref 20–29)
Calcium: 9.5 mg/dL (ref 8.7–10.2)
Creatinine, Ser: 1 mg/dL (ref 0.57–1.00)
GFR calc Af Amer: 73 mL/min/{1.73_m2} (ref >59)
GFR calc non Af Amer: 63 mL/min/{1.73_m2} (ref >59)
Globulin, Total: 2.4 g/dL (ref 1.5–4.5)
Glucose: 96 mg/dL (ref 65–99)
Potassium: 4 mmol/L (ref 3.5–5.2)
Sodium: 138 mmol/L (ref 134–144)
Total Protein: 6.4 g/dL (ref 6.0–8.5)

## 2018-06-30 LAB — CBC WITH DIFFERENTIAL/PLATELET
Basophils Absolute: 0.1 10*3/uL (ref 0.0–0.2)
Basos: 1 %
EOS (ABSOLUTE): 0.2 10*3/uL (ref 0.0–0.4)
Eos: 3 %
Hematocrit: 31.3 % — ABNORMAL LOW (ref 34.0–46.6)
Hemoglobin: 10.5 g/dL — ABNORMAL LOW (ref 11.1–15.9)
Immature Grans (Abs): 0 10*3/uL (ref 0.0–0.1)
Immature Granulocytes: 0 %
Lymphocytes Absolute: 2.3 10*3/uL (ref 0.7–3.1)
Lymphs: 36 %
MCH: 27.4 pg (ref 26.6–33.0)
MCHC: 33.5 g/dL (ref 31.5–35.7)
MCV: 82 fL (ref 79–97)
MONOS ABS: 0.7 10*3/uL (ref 0.1–0.9)
Monocytes: 11 %
Neutrophils Absolute: 3.2 10*3/uL (ref 1.4–7.0)
Neutrophils: 49 %
Platelets: 426 10*3/uL (ref 150–450)
RBC: 3.83 x10E6/uL (ref 3.77–5.28)
RDW: 14.5 % (ref 11.7–15.4)
WBC: 6.5 10*3/uL (ref 3.4–10.8)

## 2018-06-30 LAB — LIPID PANEL
Chol/HDL Ratio: 4.1 ratio (ref 0.0–4.4)
Cholesterol, Total: 236 mg/dL — ABNORMAL HIGH (ref 100–199)
HDL: 57 mg/dL (ref >39)
LDL Calculated: 155 mg/dL — ABNORMAL HIGH (ref 0–99)
TRIGLYCERIDES: 122 mg/dL (ref 0–149)
VLDL Cholesterol Cal: 24 mg/dL (ref 5–40)

## 2018-06-30 LAB — HEMOGLOBIN A1C
Est. average glucose Bld gHb Est-mCnc: 126 mg/dL
Hgb A1c MFr Bld: 6 % — ABNORMAL HIGH (ref 4.8–5.6)

## 2018-06-30 LAB — TSH: TSH: 2.03 u[IU]/mL (ref 0.450–4.500)

## 2018-06-30 MED ORDER — GABAPENTIN 100 MG PO CAPS: ORAL_CAPSULE | ORAL | 0 refills | Status: DC

## 2018-06-30 NOTE — Assessment & Plan Note (Signed)
Gabapentin 100mg  QHS for one week, then 200mg  for week, then 300mg  QHS- hold at this dose

## 2018-06-30 NOTE — Assessment & Plan Note (Signed)
Continue all medications as directed. Please start Gabapentin taper as directed. Remain well hydrated and follow Mediterranean diet. Please complete IFOBT and return to clinic. Please follow-up with Gastroenterology ASAP, re: repeat screening colonoscopy. Follow-up 6 months for complete physical.

## 2018-06-30 NOTE — Patient Instructions (Signed)
Mediterranean Diet A Mediterranean diet refers to food and lifestyle choices that are based on the traditions of countries located on the [employer]. This way of eating has been shown to help prevent certain conditions and improve outcomes for people who have chronic diseases, like kidney disease and heart disease. What are tips for following this plan? Lifestyle  Cook and eat meals together with your family, when possible.  Drink enough fluid to keep your urine clear or pale yellow.  Be physically active every day. This includes: ? Aerobic exercise like running or swimming. ? Leisure activities like gardening, walking, or housework.  Get 7-8 hours of sleep each night.  If recommended by your health care provider, drink red wine in moderation. This means 1 glass a day for nonpregnant women and 2 glasses a day for men. A glass of wine equals 5 oz (150 mL). Reading food labels   Check the serving size of packaged foods. For foods such as rice and pasta, the serving size refers to the amount of cooked product, not dry.  Check the total fat in packaged foods. Avoid foods that have saturated fat or trans fats.  Check the ingredients list for added sugars, such as corn syrup. Shopping  At the grocery store, buy most of your food from the areas near the walls of the store. This includes: ? Fresh fruits and vegetables (produce). ? Grains, beans, nuts, and seeds. Some of these may be available in unpackaged forms or large amounts (in bulk). ? Fresh seafood. ? Poultry and eggs. ? Low-fat dairy products.  Buy whole ingredients instead of prepackaged foods.  Buy fresh fruits and vegetables in-season from local farmers markets.  Buy frozen fruits and vegetables in resealable bags.  If you do not have access to quality fresh seafood, buy precooked frozen shrimp or canned fish, such as tuna, salmon, or sardines.  Buy small amounts of raw or cooked vegetables, salads, or olives from  the deli or salad bar at your store.  Stock your pantry so you always have certain foods on hand, such as olive oil, canned tuna, canned tomatoes, rice, pasta, and beans. Cooking  Cook foods with extra-virgin olive oil instead of using butter or other vegetable oils.  Have meat as a side dish, and have vegetables or grains as your main dish. This means having meat in small portions or adding small amounts of meat to foods like pasta or stew.  Use beans or vegetables instead of meat in common dishes like chili or lasagna.  Experiment with different cooking methods. Try roasting or broiling vegetables instead of steaming or sauteing them.  Add frozen vegetables to soups, stews, pasta, or rice.  Add nuts or seeds for added healthy fat at each meal. You can add these to yogurt, salads, or vegetable dishes.  Marinate fish or vegetables using olive oil, lemon juice, garlic, and fresh herbs. Meal planning   Plan to eat 1 vegetarian meal one day each week. Try to work up to 2 vegetarian meals, if possible.  Eat seafood 2 or more times a week.  Have healthy snacks readily available, such as: ? Vegetable sticks with hummus. ? [place] yogurt. ? Fruit and nut trail mix.  Eat balanced meals throughout the week. This includes: ? Fruit: 2-3 servings a day ? Vegetables: 4-5 servings a day ? Low-fat dairy: 2 servings a day ? Fish, poultry, or lean meat: 1 serving a day ? Beans and legumes: 2 or more servings a week ?  Nuts and seeds: 1-2 servings a day ? Whole grains: 6-8 servings a day ? Extra-virgin olive oil: 3-4 servings a day  Limit red meat and sweets to only a few servings a month What are my food choices?  Mediterranean diet ? Recommended ? Grains: Whole-grain pasta. Brown rice. Bulgar wheat. Polenta. Couscous. Whole-wheat bread. [place]. ? Vegetables: Artichokes. Beets. Broccoli. Cabbage. Carrots. Eggplant. Green beans. Chard. Kale. Spinach. Onions. Leeks. Peas. Squash.  Tomatoes. Peppers. Radishes. ? Fruits: Apples. Apricots. Avocado. Berries. Bananas. Cherries. Dates. Figs. Grapes. Lemons. Melon. Oranges. Peaches. Plums. Pomegranate. ? Meats and other protein foods: Beans. Almonds. Sunflower seeds. Pine nuts. Peanuts. [place]. Salmon. Scallops. Shrimp. [place]. Tilapia. Clams. Oysters. Eggs. ? Dairy: Low-fat milk. Cheese. Greek yogurt. ? Beverages: Water. Red wine. Herbal tea. ? Fats and oils: Extra virgin olive oil. Avocado oil. Grape seed oil. ? Sweets and desserts: [place] yogurt with honey. Baked apples. Poached pears. Trail mix. ? Seasoning and other foods: Basil. Cilantro. Coriander. Cumin. Mint. Parsley. Sage. Rosemary. Tarragon. Garlic. Oregano. Thyme. Pepper. Balsalmic vinegar. Tahini. Hummus. Tomato sauce. Olives. Mushrooms. ? Limit these ? Grains: Prepackaged pasta or rice dishes. Prepackaged cereal with added sugar. ? Vegetables: Deep fried potatoes (french fries). ? Fruits: Fruit canned in syrup. ? Meats and other protein foods: Beef. Pork. Lamb. Poultry with skin. Hot dogs. [place] Salines. ? Dairy: Ice cream. Sour cream. Whole milk. ? Beverages: Juice. Sugar-sweetened soft drinks. Beer. Liquor and spirits. ? Fats and oils: Butter. Canola oil. Vegetable oil. Beef fat (tallow). Lard. ? Sweets and desserts: Cookies. Cakes. Pies. Candy. ? Seasoning and other foods: Mayonnaise. Premade sauces and marinades. ? The items listed may not be a complete list. Talk with your dietitian about what dietary choices are right for you. Summary  The Mediterranean diet includes both food and lifestyle choices.  Eat a variety of fresh fruits and vegetables, beans, nuts, seeds, and whole grains.  Limit the amount of red meat and sweets that you eat.  Talk with your health care provider about whether it is safe for you to drink red wine in moderation. This means 1 glass a day for nonpregnant women and 2 glasses a day for men. A glass of wine equals 5 oz (150 mL). This information  is not intended to replace advice given to you by your health care provider. Make sure you discuss any questions you have with your health care provider. Document Released: 12/20/2015 Document Revised: 01/22/2016 Document Reviewed: 12/20/2015 Elsevier Interactive Patient Education  2019 [place].   Myofascial Pain Syndrome and Fibromyalgia Myofascial pain syndrome and fibromyalgia are both pain disorders. This pain may be felt mainly in your muscles.  Myofascial pain syndrome: ? Always has tender points in the muscle that will cause pain when pressed (trigger points). The pain may come and go. ? Usually affects your neck, upper back, and shoulder areas. The pain often radiates into your arms and hands.  Fibromyalgia: ? Has muscle pains and tenderness that come and go. ? Is often associated with fatigue and sleep problems. ? Has trigger points. ? Tends to be long-lasting (chronic), but is not life-threatening. Fibromyalgia and myofascial pain syndrome are not the same. However, they often occur together. If you have both conditions, each can make the other worse. Both are common and can cause enough pain and fatigue to make day-to-day activities difficult. Both can be hard to diagnose because their symptoms are common in many other conditions. What are the causes? The exact causes of these conditions  are not known. What increases the risk? You are more likely to develop this condition if:  You have a family history of the condition.  You have certain triggers, such as: ? Spine disorders. ? An injury (trauma) or other physical stressors. ? Being under a lot of stress. ? Medical conditions such as osteoarthritis, rheumatoid arthritis, or lupus. What are the signs or symptoms? Fibromyalgia The main symptom of fibromyalgia is widespread pain and tenderness in your muscles. Pain is sometimes described as stabbing, shooting, or burning. You may also have:  Tingling or  numbness.  Sleep problems and fatigue.  Problems with attention and concentration (fibro fog). Other symptoms may include:  Bowel and bladder problems.  Headaches.  Visual problems.  Problems with odors and noises.  Depression or mood changes.  Painful menstrual periods (dysmenorrhea).  Dry skin or eyes. These symptoms can vary over time. Myofascial pain syndrome Symptoms of myofascial pain syndrome include:  Tight, ropy bands of muscle.  Uncomfortable sensations in muscle areas. These may include aching, cramping, burning, numbness, tingling, and weakness.  Difficulty moving certain parts of the body freely (poor range of motion). How is this diagnosed? This condition may be diagnosed by your symptoms and medical history. You will also have a physical exam. In general:  Fibromyalgia is diagnosed if you have pain, fatigue, and other symptoms for more than 3 months, and symptoms cannot be explained by another condition.  Myofascial pain syndrome is diagnosed if you have trigger points in your muscles, and those trigger points are tender and cause pain elsewhere in your body (referred pain). How is this treated? Treatment for these conditions depends on the type that you have.  For fibromyalgia: ? Pain medicines, such as NSAIDs. ? Medicines for treating depression. ? Medicines for treating seizures. ? Medicines that relax the muscles.  For myofascial pain: ? Pain medicines, such as NSAIDs. ? Cooling and stretching of muscles. ? Trigger point injections. ? Sound wave (ultrasound) treatments to stimulate muscles. Treating these conditions often requires a team of health care providers. These may include:  Your primary care provider.  Physical therapist.  Complementary health care providers, such as massage therapists or acupuncturists.  Psychiatrist for cognitive behavioral therapy. Follow these instructions at home: Medicines  Take over-the-counter and  prescription medicines only as told by your health care provider.  Do not drive or use heavy machinery while taking prescription pain medicine.  If you are taking prescription pain medicine, take actions to prevent or treat constipation. Your health care provider may recommend that you: ? Drink enough fluid to keep your urine pale yellow. ? Eat foods that are high in fiber, such as fresh fruits and vegetables, whole grains, and beans. ? Limit foods that are high in fat and processed sugars, such as fried or sweet foods. ? Take an over-the-counter or prescription medicine for constipation. Lifestyle   Exercise as directed by your health care provider or physical therapist.  Practice relaxation techniques to control your stress. You may want to try: ? Biofeedback. ? Visual imagery. ? Hypnosis. ? Muscle relaxation. ? Yoga. ? Meditation.  Maintain a healthy lifestyle. This includes eating a healthy diet and getting enough sleep.  Do not use any products that contain nicotine or tobacco, such as cigarettes and e-cigarettes. If you need help quitting, ask your health care provider. General instructions  Talk to your health care provider about complementary treatments, such as acupuncture or massage.  Consider joining a support group with others who  are diagnosed with this condition.  Do not do activities that stress or strain your muscles. This includes repetitive motions and heavy lifting.  Keep all follow-up visits as told by your health care provider. This is important. Where to find more information  National Fibromyalgia Association: www.fmaware.[place]: www.arthritis.org  American Chronic Pain Association: www.theacpa.org Contact a health care provider if:  You have new symptoms.  Your symptoms get worse or your pain is severe.  You have side effects from your medicines.  You have trouble sleeping.  Your condition is causing depression or  anxiety. Summary  Myofascial pain syndrome and fibromyalgia are pain disorders.  Myofascial pain syndrome has tender points in the muscle that will cause pain when pressed (trigger points). Fibromyalgia also has muscle pains and tenderness that come and go, but this condition is often associated with fatigue and sleep disturbances.  Fibromyalgia and myofascial pain syndrome are not the same but often occur together, causing pain and fatigue that make day-to-day activities difficult.  Treatment for fibromyalgia includes taking medicines to relax the muscles and medicines for pain, depression, or seizures. Treatment for myofascial pain syndrome includes taking medicines for pain, cooling and stretching of muscles, and injecting medicines into trigger points.  Follow your health care provider's instructions for taking medicines and maintaining a healthy lifestyle. This information is not intended to replace advice given to you by your health care provider. Make sure you discuss any questions you have with your health care provider. Document Released: 04/28/2005 Document Revised: 05/13/2017 Document Reviewed: 05/13/2017 Elsevier Interactive Patient Education  2019 [place] all medications as directed. Please start Gabapentin taper as directed. Remain well hydrated and follow Mediterranean diet. Please complete IFOBT and return to clinic. Please follow-up with Gastroenterology ASAP, re: repeat screening colonoscopy. Follow-up 6 months for complete physical. GREAT TO SEE YOU!

## 2018-08-04 ENCOUNTER — Other Ambulatory Visit: Payer: Self-pay | Admitting: Adult Health

## 2018-08-04 DIAGNOSIS — K219 Gastro-esophageal reflux disease without esophagitis: Secondary | ICD-10-CM

## 2018-08-04 DIAGNOSIS — I10 Essential (primary) hypertension: Secondary | ICD-10-CM

## 2018-08-31 ENCOUNTER — Other Ambulatory Visit: Payer: Self-pay | Admitting: Adult Health

## 2018-11-15 ENCOUNTER — Other Ambulatory Visit: Payer: Self-pay

## 2018-11-15 ENCOUNTER — Emergency Department (HOSPITAL_COMMUNITY)
Admission: EM | Admit: 2018-11-15 | Discharge: 2018-11-15 | Disposition: A | Discharge status: Home / Self Care | Payer: Self-pay | Attending: Emergency Medicine | Admitting: Emergency Medicine

## 2018-11-15 ENCOUNTER — Encounter (HOSPITAL_COMMUNITY): Payer: Self-pay | Admitting: *Deleted

## 2018-11-15 ENCOUNTER — Emergency Department (HOSPITAL_COMMUNITY): Payer: Self-pay

## 2018-11-15 DIAGNOSIS — Y92013 Bedroom of single-family (private) house as the place of occurrence of the external cause: Secondary | ICD-10-CM | POA: Insufficient documentation

## 2018-11-15 DIAGNOSIS — Z79899 Other long term (current) drug therapy: Secondary | ICD-10-CM | POA: Insufficient documentation

## 2018-11-15 DIAGNOSIS — M069 Rheumatoid arthritis, unspecified: Secondary | ICD-10-CM | POA: Insufficient documentation

## 2018-11-15 DIAGNOSIS — Y939 Activity, unspecified: Secondary | ICD-10-CM | POA: Insufficient documentation

## 2018-11-15 DIAGNOSIS — W1830XA Fall on same level, unspecified, initial encounter: Secondary | ICD-10-CM | POA: Insufficient documentation

## 2018-11-15 DIAGNOSIS — M25562 Pain in left knee: Secondary | ICD-10-CM

## 2018-11-15 DIAGNOSIS — M25561 Pain in right knee: Secondary | ICD-10-CM | POA: Insufficient documentation

## 2018-11-15 DIAGNOSIS — I10 Essential (primary) hypertension: Secondary | ICD-10-CM | POA: Insufficient documentation

## 2018-11-15 DIAGNOSIS — Y999 Unspecified external cause status: Secondary | ICD-10-CM | POA: Insufficient documentation

## 2018-11-15 DIAGNOSIS — Z96652 Presence of left artificial knee joint: Secondary | ICD-10-CM | POA: Insufficient documentation

## 2018-11-15 MED ORDER — HYDROCODONE-ACETAMINOPHEN 5-325 MG PO TABS: 1.0000 | ORAL_TABLET | Freq: Four times a day (QID) | ORAL | 0 refills | Status: DC | PRN

## 2018-11-15 MED ORDER — HYDROCODONE-ACETAMINOPHEN 5-325 MG PO TABS
1.0000 | ORAL_TABLET | Freq: Once | ORAL | Status: AC
  Administered 2018-11-15: 1 via ORAL
  Filled 2018-11-15: qty 1

## 2018-11-15 NOTE — ED Notes (Signed)
Patient verbalizes understanding of discharge instructions. Opportunity for questioning and answers were provided. Armband removed by staff, pt discharged from ED.  

## 2018-11-15 NOTE — Discharge Instructions (Signed)
You have been seen today for a knee injury. There were no acute abnormalities on the x-rays, including no sign of fracture or dislocation, however, there could be injuries to the soft tissues, such as the ligaments or tendons that are not seen on xrays. There could also be what are called occult fractures that are small fractures not seen on xray. Antiinflammatory medications: Take 600 mg of ibuprofen every 6 hours or 440 mg (over the counter dose) to 500 mg (prescription dose) of naproxen every 12 hours for the next 3 days. After this time, these medications may be used as needed for pain. Take these medications with food to avoid upset stomach. Choose only one of these medications, do not take them together. Acetaminophen (generic for Tylenol): Should you continue to have additional pain while taking the ibuprofen or naproxen, you may add in acetaminophen as needed. Your daily total maximum amount of acetaminophen from all sources should be limited to 4000mg /day for persons without liver problems, or 2000mg /day for those with liver problems. Ice: May apply ice to the area over the next 24 hours for 15 minutes at a time to reduce swelling. Elevation: Keep the extremity elevated as often as possible to reduce pain and inflammation. Support: Wear the knee immobilizer for support and comfort. Wear this until pain resolves. You will be weight-bearing as tolerated, which means you can slowly start to put weight on the extremity and increase amount and frequency as pain allows. Follow up: Follow-up with the orthopedic specialist as soon as possible in this manner.  Call the number provided to set up an appointment. Return: Return to the ED for numbness, weakness, increasing pain, overall worsening symptoms, loss of function, or if symptoms are not improving, you have tried to follow up with the orthopedic specialist, and have been unable to do so.  For prescription assistance, may try using prescription discount  sites or apps, such as goodrx.com

## 2018-11-15 NOTE — Progress Notes (Signed)
Orthopedic Tech Progress Note Patient Details:  [name] Nov 25, 1960 [id number]  Ortho Devices Type of Ortho Device: Crutches, Knee Immobilizer Ortho Device/Splint Interventions: Application, Ordered, Adjustment   Post Interventions Patient Tolerated: Well Instructions Provided: Poper ambulation with device, Care of device, Adjustment of device   Melony Overly T 11/15/2018, 3:03 PM

## 2018-11-15 NOTE — ED Notes (Signed)
Patient undressed, and ready for eval.

## 2018-11-15 NOTE — ED Triage Notes (Signed)
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

## 2018-11-15 NOTE — ED Provider Notes (Signed)
[name] Provider Note   CSN: [id number] Arrival date & time: 11/15/18  1152    History   Chief Complaint Chief Complaint  Patient presents with  . Knee Pain    HPI [name] is a 58 y.o. female.     HPI  [name] is a 58 y.o. female, with a history of left total knee replacement about 14 years ago, HTN, presenting to the ED with left knee pain beginning yesterday.  Patient states she stood up from bed, does not know exactly what happened next, but fell and landed on the left knee.  Pain is throbbing, 10/10, radiating into the proximal tibia, worsening since onset, accompanied by swelling.  She states she is able to bear weight, however, is having difficulty bending the knee. Denies head injury, neck/back pain, numbness, weakness, deformity, other joint pain, or any other complaints.  Orthopedist: Dr. Eduard Roux  Past Medical History:  Diagnosis Date  . Acid reflux    takes Zantac and Omeprazole daily  . Anxiety    takes Citaopram daily  . Arthritis    right knee  . Chronic back pain    DDD  . Clotting disorder (HCC)    hx of blood clot following knee scope  . History of blood clots    36yrs ago and in left leg  . History of bronchitis 3+yrs ago  . Hyperlipidemia    takes Atorvastatin daily  . Hypertension    takes Lisinopril and HCTZ daily  . Insomnia    takes Elavil nightly as needed  . Joint pain   . Joint swelling   . Paraesophageal hernia   . Weakness    numbness and tingling in both feet    Patient Active Problem List   Diagnosis Date Noted  . Restless legs syndrome (RLS) 06/30/2018  . Arthrosis of first carpometacarpal joint 02/11/2018  . Primary osteoarthritis of first carpometacarpal joint of left hand   . Pain in left hand 12/23/2017  . Healthcare maintenance 11/16/2017  . Tinea versicolor 11/16/2017  . Chronic right shoulder pain 09/02/2017  . Fibromyalgia 08/11/2017  . Family history of diabetes  mellitus 06/23/2016  . Other fatigue 06/23/2016  . Insomnia 06/23/2016  . Rheumatoid arthritis ([place]) 06/23/2016  . Nontraumatic incomplete tear of right rotator cuff 04/28/2016  . Impingement syndrome of right shoulder 03/31/2016  . Status post right partial knee replacement 09/15/2014  . Acute medial meniscal injury of knee 06/14/2014  . Status post total knee replacement using cement 06/08/2014  . Primary osteoarthritis of right knee 06/08/2014  . Screening for colon cancer   . Schatzki's ring   . Hiatal hernia   . Dysphagia, pharyngoesophageal phase 05/26/2014  . Encounter for screening colonoscopy 05/26/2014  . Adrenal adenoma 05/01/2014  . Breast lump 05/01/2014  . Metatarsalgia of both feet 01/12/2014  . Bilateral leg pain 01/11/2014  . Bilateral edema of lower extremity 01/11/2014  . Other osteoarthritis of spine, thoracolumbar region 12/26/2013  . Hypercholesteremia 05/19/2013  . Periodic health assessment, general screening, adult 04/13/2013  . GERD (gastroesophageal reflux disease) 04/13/2013  . HTN (hypertension) 04/13/2013  . Dyspnea 04/13/2013    Past Surgical History:  Procedure Laterality Date  . BREAST BIOPSY Left   . BUNIONECTOMY Right 02/18/2018   Procedure: Yehuda Budd;  Surgeon: Edrick Kins, DPM;  Location: [place];  Service: Podiatry;  Laterality: Right;  . CAPSULOTOMY Bilateral 02/18/2018   Procedure: CAPSULOTOMY MPJ RELEASE JOINT 2N  BILATERAL;  Surgeon: Edrick Kins, DPM;  Location: [place];  Service: Podiatry;  Laterality: Bilateral;  . CARPOMETACARPEL SUSPENSION PLASTY Left 01/27/2018   Procedure: LEFT THUMB ligament reconstruction and tendon interposition;  Surgeon: Leandrew Koyanagi, MD;  Location: [place];  Service: Orthopedics;  Laterality: Left;  . CHONDROPLASTY Right 06/28/2014   Procedure: CHONDROPLASTY;  Surgeon: Marianna Payment, MD;  Location: [place];  Service: Orthopedics;  Laterality: Right;  .  COLONOSCOPY N/A 05/29/2014   Procedure: COLONOSCOPY;  Surgeon: Daneil Dolin, MD;  [facility];  Service: Endoscopy;  Laterality: N/A;  215pm- Pt is working until 12:00 so she can't come any earlier  . ESOPHAGOGASTRODUODENOSCOPY N/A 05/29/2014   Procedure: ESOPHAGOGASTRODUODENOSCOPY (EGD);  Surgeon: Daneil Dolin, MD;  [facility];  Service: Endoscopy;  Laterality: N/A;  . GANGLION CYST EXCISION Left 01/13/2002  . HAMMER TOE SURGERY Bilateral 02/18/2018   Procedure: HAMMER TOE CORRECTION2ND BILATERAL;  Surgeon: Edrick Kins, DPM;  Location: [place];  Service: Podiatry;  Laterality: Bilateral;  . KNEE ARTHROSCOPY WITH MEDIAL MENISECTOMY Right 06/28/2014   Procedure: RIGHT KNEE ARTHROSCOPY WITH PARTIAL MEDIAL MENISCECTOMY AND CHONDROPLASTY;  Surgeon: Marianna Payment, MD;  Location: [place];  Service: Orthopedics;  Laterality: Right;  . MALONEY DILATION N/A 05/29/2014   Procedure: Venia Minks DILATION;  Surgeon: Daneil Dolin, MD;  [facility];  Service: Endoscopy;  Laterality: N/A;  . PARTIAL KNEE ARTHROPLASTY Right 09/15/2014   Procedure: RIGHT UNICOMPARTMENTAL KNEE ARTHROPLASTY;  Surgeon: Leandrew Koyanagi, MD;  Location: [place];  Service: Orthopedics;  Laterality: Right;  . TOTAL KNEE ARTHROPLASTY Left 04/08/2004     OB History    Gravida  6   Para  5   Term  5   Preterm      AB  1   Living  5     SAB  1   TAB      Ectopic      Multiple      Live Births               Home Medications    Prior to Admission medications   Medication Sig Start Date End Date Taking? Authorizing Provider  atorvastatin (LIPITOR) 10 MG tablet TAKE 1 TABLET BY MOUTH ONCE DAILY. PATIENT  MUST  HAVE  OFFICE  VISIT  PRIOR  TO  ANY  FURTHER  REFILLS 04/26/18   Mina Marble D, NP  diclofenac sodium (VOLTAREN) 1 % GEL Apply 2 g topically 4 (four) times daily as needed. Patient taking differently: Apply 2 g topically 4 (four) times daily as needed  (pain).  12/23/17   Aundra Dubin, PA-C  DULoxetine (CYMBALTA) 60 MG capsule Take 1 capsule by mouth once daily 08/31/18   Danford, Valetta Fuller D, NP  gabapentin (NEURONTIN) 100 MG capsule Week one- 1 capsule at bedtime, week two- 2 capsules at bedtimes, week 3- 3 capsules at bedtime 06/30/18   Danford, Valetta Fuller D, NP  hydrochlorothiazide (HYDRODIURIL) 50 MG tablet Take 1 tablet (50 mg total) by mouth daily. 11/16/17   Danford, Valetta Fuller D, NP  HYDROcodone-acetaminophen (NORCO/VICODIN) 5-325 MG tablet Take 1-2 tablets by mouth every 6 (six) hours as needed for severe pain. 11/15/18   Kate Sweetman C, PA-C  ibuprofen (ADVIL,MOTRIN) 600 MG tablet Take 600 mg by mouth daily as needed for moderate pain.    [provider]  ketoconazole (NIZORAL) 2 % shampoo Apply 1 application topically  2 (two) times a week. Patient taking differently: Apply 1 application topically daily as needed for irritation.  11/16/17   Danford, Valetta Fuller D, NP  lisinopril (PRINIVIL,ZESTRIL) 40 MG tablet Take 1 tablet by mouth once daily 08/04/18   Danford, Valetta Fuller D, NP  omeprazole (PRILOSEC) 20 MG capsule TAKE 1 CAPSULE BY MOUTH TWICE DAILY BEFORE MEAL 08/04/18   Danford, Berna Spare, NP    Family History Family History  Problem Relation Age of Onset  . Cancer Father        lung  . Cancer Sister        breast  . Breast cancer Sister 13  . Cancer Maternal Grandfather        lung  . Cancer Paternal Grandfather        lung  . Diabetes Son   . Colon cancer Neg Hx     Social History Social History   Tobacco Use  . Smoking status: Never Smoker  . Smokeless tobacco: Never Used  Substance Use Topics  . Alcohol use: No    Alcohol/week: 0.0 standard drinks  . Drug use: No     Allergies   Tramadol   Review of Systems Review of Systems  Musculoskeletal: Positive for arthralgias and joint swelling.  Neurological: Negative for weakness and numbness.     Physical Exam Updated Vital Signs BP (!) 142/84 (BP Location: Right Arm)   Pulse 82    Temp 98.2 F (36.8 C) (Oral)   Resp 16   Ht 5\' 4"  (1.626 m)   Wt 90.7 kg   LMP 02/07/2013   SpO2 100%   BMI 34.33 kg/m   Physical Exam Vitals signs and nursing note reviewed.  Constitutional:      General: She is not in acute distress.    Appearance: She is well-developed. She is not diaphoretic.  HENT:     Head: Normocephalic and atraumatic.  Eyes:     Conjunctiva/sclera: Conjunctivae normal.  Neck:     Musculoskeletal: Neck supple.  Cardiovascular:     Rate and Rhythm: Normal rate and regular rhythm.     Pulses:          Dorsalis pedis pulses are 2+ on the left side.       Posterior tibial pulses are 2+ on the left side.  Pulmonary:     Effort: Pulmonary effort is normal.  Musculoskeletal:     Comments: Tenderness across the anterior left knee into the proximal third of the tibia, however, most tenderness is centered around the anterior knee.  Associated swelling without obvious deformity.  Exam complicated by patient's body habitus. Pain with attempted flexion of the knee. No pain, tenderness, deformity, or other abnormality to the left hip or ankle.  Skin:    General: Skin is warm and dry.     Capillary Refill: Capillary refill takes less than 2 seconds.     Coloration: Skin is not pale.  Neurological:     Mental Status: She is alert.     Comments: Sensation to light touch grossly intact in the left lower extremity. Strength 5/5 at the left ankle and left hip.  Psychiatric:        Behavior: Behavior normal.      ED Treatments / Results  Labs (all labs ordered are listed, but only abnormal results are displayed) Labs Reviewed - No data to display  EKG None  Radiology Dg Knee Complete 4 Views Left  Result Date: 11/15/2018 CLINICAL DATA:  Anterior left knee  pain. EXAM: LEFT KNEE - COMPLETE 4+ VIEW COMPARISON:  None. FINDINGS: Diffuse osteopenia. Three component arthroplasty of the left knee with normal alignment of the hardware and no evidence of loosening.  No evidence of fracture. Moderate in size suprapatellar joint effusion. IMPRESSION: 1. Three component arthroplasty of the left knee with normal alignment of the hardware and no evidence of loosening. 2. Moderate in size suprapatellar joint effusion. Electronically Signed   By: Fidela Salisbury M.D.   On: 11/15/2018 14:03    Procedures Procedures (including critical care time)  Medications Ordered in ED Medications  HYDROcodone-acetaminophen (NORCO/VICODIN) 5-325 MG per tablet 1 tablet (1 tablet Oral Given 11/15/18 1345)     Initial Impression / Assessment and Plan / ED Course  I have reviewed the triage vital signs and the nursing notes.  Pertinent labs & imaging results that were available during my care of the patient were reviewed by me and considered in my medical decision making (see chart for details).        Patient presents with left knee pain following a fall yesterday.  No evidence of neurovascular compromise.  Effusion without acute osseous injury on x-ray today.  Orthopedic follow-up. The patient was given instructions for home care as well as return precautions. Patient voices understanding of these instructions, accepts the plan, and is comfortable with discharge.  Final Clinical Impressions(s) / ED Diagnoses   Final diagnoses:  Acute pain of left knee    ED Discharge Orders         Ordered    HYDROcodone-acetaminophen (NORCO/VICODIN) 5-325 MG tablet  Every 6 hours PRN     11/15/18 1511           Lorayne Bender, PA-C 11/15/18 1524    Charlesetta Shanks, MD 11/16/18 1126

## 2018-11-18 ENCOUNTER — Other Ambulatory Visit: Payer: Self-pay | Admitting: Adult Health

## 2018-11-18 ENCOUNTER — Telehealth: Payer: Self-pay

## 2018-11-18 NOTE — Telephone Encounter (Signed)
Per Katy's last OV note:   FOLLOW-UP:  Return in about 6 months (around 12/29/2018) for CPE.  Please call pt to schedule CPE.  No further refills until pt is seen for CPE.  Charyl Bigger, CMA

## 2018-12-29 ENCOUNTER — Encounter: Payer: Self-pay | Admitting: Adult Health

## 2019-01-10 NOTE — Progress Notes (Signed)
Subjective:    Patient ID: [name], female    DOB: 10-Nov-1960, 58 y.o.   MRN: [id number]  HPI:  [name] is here for CPE She continues to experience diffuse musculoskeletal pain and increased frequency of weakness and falls. She has a walker at home, however does not use it. Last contact with Rheumatologist >2 years ago, new referral placed today- hx fo RA She is currently waiting on Disability and Medicaid benefits to be awarded- only able to work 15 hrs/week- under sig financial stress. She continues to abstain from tobacce/vape/ETOH use She reports stable mood, denies SI/HI, however reports increase in stress- financial worry and her son and his gf living with her and not contributing to household finances Reports increase in GERD sx's Denies CP/chest tightness with exertion   06/29/2018 The 10-year ASCVD risk score Mikey Bussing DC Jr., et al., 2013) is: 2.6%   Values used to calculate the score:     Age: 54 years     Sex: Female     Is Non-Hispanic African American: No     Diabetic: No     Tobacco smoker: No     Systolic Blood Pressure: 123456 mmHg     Is BP treated: Yes     HDL Cholesterol: 57 mg/dL     Total Cholesterol: 236 mg/dL  LDL-155 Has been off Atorvastatin for months Will repeat lipids and if indicated will re-start statin  Lab Results  Component Value Date   HGBA1C 5.8 (A) 01/11/2019   HGBA1C 6.0 (H) 06/29/2018   HGBA1C 5.9 (H) 06/23/2016   Healthcare Maintenance: PAP-UTD,last 07/2017 Mammogram-Ordered  Colonoscopy-past due, recommended to update- refused due to lack of health insurance Immunizations-declined Zoster  AAA Screening- N/A  Patient Care Team    Relationship Specialty Notifications Start End  Boyle, Valetta Fuller D, NP PCP - General Family Medicine  06/23/16   Daneil Dolin, MD Consulting Physician Gastroenterology  03/08/14   Leandrew Koyanagi, MD Attending Physician Orthopedic Surgery  06/23/16   Hurley Cisco, MD Consulting Physician Rheumatology   06/23/16     Patient Active Problem List   Diagnosis Date Noted   Restless legs syndrome (RLS) 06/30/2018   Arthrosis of first carpometacarpal joint 02/11/2018   Primary osteoarthritis of first carpometacarpal joint of left hand    Pain in left hand 12/23/2017   Healthcare maintenance 11/16/2017   Tinea versicolor 11/16/2017   Chronic right shoulder pain 09/02/2017   Fibromyalgia 08/11/2017   Family history of diabetes mellitus 06/23/2016   Other fatigue 06/23/2016   Insomnia 06/23/2016   Rheumatoid arthritis ([place]) 06/23/2016   Nontraumatic incomplete tear of right rotator cuff 04/28/2016   Impingement syndrome of right shoulder 03/31/2016   Status post right partial knee replacement 09/15/2014   Acute medial meniscal injury of knee 06/14/2014   Status post total knee replacement using cement 06/08/2014   Primary osteoarthritis of right knee 06/08/2014   Screening for colon cancer    Schatzki's ring    Hiatal hernia    Dysphagia, pharyngoesophageal phase 05/26/2014   Encounter for screening colonoscopy 05/26/2014   Adrenal adenoma 05/01/2014   Breast lump 05/01/2014   Metatarsalgia of both feet 01/12/2014   Bilateral leg pain 01/11/2014   Bilateral edema of lower extremity 01/11/2014   Other osteoarthritis of spine, thoracolumbar region 12/26/2013   Hypercholesteremia 05/19/2013   Periodic health assessment, general screening, adult 04/13/2013   GERD (gastroesophageal reflux disease) 04/13/2013   HTN (hypertension) 04/13/2013   Dyspnea 04/13/2013  Past Medical History:  Diagnosis Date   Acid reflux    takes Zantac and Omeprazole daily   Anxiety    takes Citaopram daily   Arthritis    right knee   Chronic back pain    DDD   Clotting disorder (HCC)    hx of blood clot following knee scope   History of blood clots    31yrs ago and in left leg   History of bronchitis 3+yrs ago   Hyperlipidemia    takes Atorvastatin  daily   Hypertension    takes Lisinopril and HCTZ daily   Insomnia    takes Elavil nightly as needed   Joint pain    Joint swelling    Paraesophageal hernia    Weakness    numbness and tingling in both feet     Past Surgical History:  Procedure Laterality Date   BREAST BIOPSY Left    BUNIONECTOMY Right 02/18/2018   Procedure: Yehuda Budd;  Surgeon: Edrick Kins, DPM;  Location: [facility];  Service: Podiatry;  Laterality: Right;   CAPSULOTOMY Bilateral 02/18/2018   Procedure: CAPSULOTOMY MPJ RELEASE JOINT 2N BILATERAL;  Surgeon: Edrick Kins, DPM;  Location: [facility];  Service: Podiatry;  Laterality: Bilateral;   CARPOMETACARPEL SUSPENSION PLASTY Left 01/27/2018   Procedure: LEFT THUMB ligament reconstruction and tendon interposition;  Surgeon: Leandrew Koyanagi, MD;  Location: [facility];  Service: Orthopedics;  Laterality: Left;   CHONDROPLASTY Right 06/28/2014   Procedure: CHONDROPLASTY;  Surgeon: Marianna Payment, MD;  Location: [facility];  Service: Orthopedics;  Laterality: Right;   COLONOSCOPY N/A 05/29/2014   Procedure: COLONOSCOPY;  Surgeon: Daneil Dolin, MD;  [facility];  Service: Endoscopy;  Laterality: N/A;  215pm- Pt is working until 12:00 so she can't come any earlier   ESOPHAGOGASTRODUODENOSCOPY N/A 05/29/2014   Procedure: ESOPHAGOGASTRODUODENOSCOPY (EGD);  Surgeon: Daneil Dolin, MD;  [facility];  Service: Endoscopy;  Laterality: N/A;   GANGLION CYST EXCISION Left 01/13/2002   HAMMER TOE SURGERY Bilateral 02/18/2018   Procedure: HAMMER TOE CORRECTION2ND BILATERAL;  Surgeon: Edrick Kins, DPM;  Location: [facility];  Service: Podiatry;  Laterality: Bilateral;   KNEE ARTHROSCOPY WITH MEDIAL MENISECTOMY Right 06/28/2014   Procedure: RIGHT KNEE ARTHROSCOPY WITH PARTIAL MEDIAL MENISCECTOMY AND CHONDROPLASTY;  Surgeon: Marianna Payment, MD;  Location: [facility];  Service: Orthopedics;   Laterality: Right;   MALONEY DILATION N/A 05/29/2014   Procedure: Venia Minks DILATION;  Surgeon: Daneil Dolin, MD;  [facility];  Service: Endoscopy;  Laterality: N/A;   PARTIAL KNEE ARTHROPLASTY Right 09/15/2014   Procedure: RIGHT UNICOMPARTMENTAL KNEE ARTHROPLASTY;  Surgeon: Leandrew Koyanagi, MD;  Location: [facility];  Service: Orthopedics;  Laterality: Right;   TOTAL KNEE ARTHROPLASTY Left 04/08/2004     Family History  Problem Relation Age of Onset   Cancer Father        lung   Cancer Sister        breast   Breast cancer Sister 63   Cancer Maternal Grandfather        lung   Cancer Paternal Grandfather        lung   Diabetes Son    Colon cancer Neg Hx      Social History   Substance and Sexual Activity  Drug Use No     Social History   Substance and Sexual Activity  Alcohol Use No   Alcohol/week: 0.0 standard  drinks     Social History   Tobacco Use  Smoking Status Never Smoker  Smokeless Tobacco Never Used     Outpatient Encounter Medications as of 01/11/2019  Medication Sig   diclofenac sodium (VOLTAREN) 1 % GEL Apply 2 g topically 4 (four) times daily as needed. (Patient taking differently: Apply 2 g topically 4 (four) times daily as needed (pain). )   DULoxetine (CYMBALTA) 60 MG capsule Take 1 capsule (60 mg total) by mouth daily.   hydrochlorothiazide (HYDRODIURIL) 25 MG tablet Take 1 tablet (25 mg total) by mouth daily.   ibuprofen (ADVIL,MOTRIN) 600 MG tablet Take 600 mg by mouth daily as needed for moderate pain.   ketoconazole (NIZORAL) 2 % shampoo Apply 1 application topically 2 (two) times a week. (Patient taking differently: Apply 1 application topically daily as needed for irritation. )   lisinopril (ZESTRIL) 40 MG tablet Take 1 tablet (40 mg total) by mouth daily.   omeprazole (PRILOSEC) 20 MG capsule TAKE 1 CAPSULE BY MOUTH TWICE DAILY BEFORE MEAL   [DISCONTINUED] DULoxetine (CYMBALTA) 60 MG capsule Take 1 capsule by mouth  once daily   [DISCONTINUED] hydrochlorothiazide (HYDRODIURIL) 50 MG tablet Take 1 tablet (50 mg total) by mouth daily.   [DISCONTINUED] lisinopril (PRINIVIL,ZESTRIL) 40 MG tablet Take 1 tablet by mouth once daily   Ciprofloxacin HCl 0.2 % otic solution Place 0.2 mLs into the left ear 2 (two) times daily.   [DISCONTINUED] atorvastatin (LIPITOR) 10 MG tablet TAKE 1 TABLET BY MOUTH ONCE DAILY. PATIENT  MUST  HAVE  OFFICE  VISIT  PRIOR  TO  ANY  FURTHER  REFILLS   [DISCONTINUED] atorvastatin (LIPITOR) 10 MG tablet TAKE 1 TABLET BY MOUTH ONCE DAILY   [DISCONTINUED] ciprofloxacin-fluocinolone PF (OTOVEL) 0.3-0.025 % SOLN Place 0.25 mLs into the left ear 2 (two) times daily.   [DISCONTINUED] gabapentin (NEURONTIN) 100 MG capsule Week one- 1 capsule at bedtime, week two- 2 capsules at bedtimes, week 3- 3 capsules at bedtime   [DISCONTINUED] hydrochlorothiazide (HYDRODIURIL) 25 MG tablet Take 2 tablets (50 mg total) by mouth daily. NO FURTHER REFILLS UNTIL PATIENT IS SEEN IN THE OFFICE   [DISCONTINUED] HYDROcodone-acetaminophen (NORCO/VICODIN) 5-325 MG tablet Take 1-2 tablets by mouth every 6 (six) hours as needed for severe pain.   No facility-administered encounter medications on file as of 01/11/2019.     Allergies: Tramadol  Body mass index is 37.51 kg/m.  Blood pressure 113/72, pulse 75, temperature 98.6 F (37 C), temperature source Oral, height 5' 3.5" (1.613 m), weight 215 lb 1.6 oz (97.6 kg), last menstrual period 02/07/2013, SpO2 99 %.  Review of Systems  Constitutional: Positive for fatigue. Negative for activity change, appetite change, chills, diaphoresis, fever and unexpected weight change.  HENT: Negative for congestion.   Eyes: Negative for visual disturbance.  Respiratory: Negative for cough, chest tightness, shortness of breath, wheezing and stridor.   Cardiovascular: Negative for chest pain and palpitations.  Gastrointestinal: Negative for abdominal distention, anal  bleeding, blood in stool, constipation, diarrhea, nausea, rectal pain and vomiting.  Endocrine: Negative for cold intolerance, heat intolerance, polydipsia, polyphagia and polyuria.  Genitourinary: Negative for difficulty urinating and flank pain.  Musculoskeletal: Positive for arthralgias, back pain, gait problem, joint swelling, myalgias, neck pain and neck stiffness.  Skin: Negative for color change, pallor, rash and wound.  Neurological: Negative for dizziness and headaches.  Hematological: Negative for adenopathy. Does not bruise/bleed easily.  Psychiatric/Behavioral: Negative for agitation, behavioral problems, confusion, decreased concentration, dysphoric mood, hallucinations, self-injury, sleep  disturbance and suicidal ideas. The patient is nervous/anxious. The patient is not hyperactive.        Objective:   Physical Exam Vitals signs and nursing note reviewed.  Constitutional:      General: She is not in acute distress.    Appearance: She is obese. She is not ill-appearing, toxic-appearing or diaphoretic.  HENT:     Head: Normocephalic and atraumatic.     Right Ear: Tympanic membrane, ear canal and external ear normal. No decreased hearing noted. Tympanic membrane is not bulging.     Left Ear: Ear canal and external ear normal. No decreased hearing noted. Tympanic membrane is bulging.     Nose: Nose normal.     Right Turbinates: Not enlarged or swollen.     Left Turbinates: Not enlarged or swollen.     Mouth/Throat:     Mouth: Mucous membranes are moist.     Dentition: Abnormal dentition.     Pharynx: No oropharyngeal exudate.     Tonsils: 0 on the right. 0 on the left.  Eyes:     Extraocular Movements: Extraocular movements intact.     Conjunctiva/sclera: Conjunctivae normal.     Pupils: Pupils are equal, round, and reactive to light.  Neck:     Musculoskeletal: Normal range of motion.  Cardiovascular:     Rate and Rhythm: Normal rate and regular rhythm.     Pulses:  Normal pulses.     Heart sounds: Normal heart sounds.  Pulmonary:     Effort: Pulmonary effort is normal. No respiratory distress.     Breath sounds: Normal breath sounds. No stridor. No wheezing, rhonchi or rales.  Chest:     Chest wall: No tenderness.  Abdominal:     General: Abdomen is protuberant. Bowel sounds are normal.     Palpations: Abdomen is soft.     Tenderness: There is no abdominal tenderness. There is no right CVA tenderness, left CVA tenderness, guarding or rebound. Negative signs include Murphy's sign, Rovsing's sign, McBurney's sign, psoas sign and obturator sign.       Comments: Palpable knot LUQ- per pt present for >4 years, non-tender, has not been grown in size   Musculoskeletal:        General: Swelling and tenderness present.     Right shoulder: She exhibits tenderness.     Left shoulder: She exhibits tenderness.     Right hip: She exhibits decreased strength.     Left hip: She exhibits decreased strength.     Left knee: Tenderness found.     Right ankle: She exhibits swelling.     Left ankle: She exhibits swelling.     Right lower leg: Edema present.     Left lower leg: Edema present.     Right foot: Swelling present.     Left foot: Swelling present.  Skin:    General: Skin is warm and dry.     Capillary Refill: Capillary refill takes less than 2 seconds.  Neurological:     Mental Status: She is alert and oriented to person, place, and time.     Coordination: Coordination normal.  Psychiatric:        Mood and Affect: Mood normal.        Behavior: Behavior normal.        Thought Content: Thought content normal.        Judgment: Judgment normal.           Assessment & Plan:   1. Screening  for breast cancer   2. Prediabetes   3. Hypertension, unspecified type   4. Rheumatoid arthritis involving both hands with positive rheumatoid factor (HCC)   5. Essential hypertension   6. Elevated LDL cholesterol level   7. Healthcare maintenance   8.  Hypercholesteremia   9. Gastroesophageal reflux disease, esophagitis presence not specified   10. Fibromyalgia   11. Breast lump     Healthcare maintenance A1c- 5.8, improved from 6.0 in Feb. Referrals placed to Rheumatology and Gastroenterology- imperative that you follow up with these specialists  Continue all medications as directed- refills sent in, reduced Hydrochlorothiazide to 25mg  once daily. Please use walker when needed, especially at night. Please schedule fasting lab appt in next 1-2 weeks, will re-start Atorvastatin if indicated. Continue to follow heart healthy diet and ensure that you avoid foods that are difficult to swallow. Mammogram ordered  Follow-up here in 3 months. Continue to social distance and wear a mask when in public.  Hypercholesteremia 06/29/2018 The 10-year ASCVD risk score Mikey Bussing DC Jr., et al., 2013) is: 2.6%   Values used to calculate the score:     Age: 31 years     Sex: Female     Is Non-Hispanic African American: No     Diabetic: No     Tobacco smoker: No     Systolic Blood Pressure: 123456 mmHg     Is BP treated: Yes     HDL Cholesterol: 57 mg/dL     Total Cholesterol: 236 mg/dL LDL-155 Has been off Atorvastatin 10mg  for months  Future Lipids ordered   HTN (hypertension) BP at goa 113/72, HR 75 Continue Lisinopril 40mg  QD, reduce HCTZ from 50mg  to 25mg  QD  GERD (gastroesophageal reflux disease) Increasing in sx severity and frequency- needs to f/u with GI Referral placed   Rheumatoid arthritis ([place]) Diffuse musculoskeletal pain/weakness worsening She reports several falls in last couple months She has walker-however does not use it- advised to use it  She has not followed up with Rheumatologist in years, new referral placed   Fibromyalgia Duloxetine 60mg  QD    FOLLOW-UP:  Return in about 3 months (around 04/12/2019) for Regular Follow Up.

## 2019-01-11 ENCOUNTER — Encounter: Payer: Self-pay | Admitting: Adult Health

## 2019-01-11 ENCOUNTER — Other Ambulatory Visit: Payer: Self-pay

## 2019-01-11 ENCOUNTER — Ambulatory Visit (INDEPENDENT_AMBULATORY_CARE_PROVIDER_SITE_OTHER): Payer: Self-pay | Admitting: Adult Health

## 2019-01-11 ENCOUNTER — Telehealth: Payer: Self-pay

## 2019-01-11 VITALS — BP 113/72 | HR 75 | Temp 98.6°F | Ht 63.5 in | Wt 215.1 lb

## 2019-01-11 DIAGNOSIS — R7303 Prediabetes: Secondary | ICD-10-CM

## 2019-01-11 DIAGNOSIS — E7800 Pure hypercholesterolemia, unspecified: Secondary | ICD-10-CM

## 2019-01-11 DIAGNOSIS — Z1239 Encounter for other screening for malignant neoplasm of breast: Secondary | ICD-10-CM

## 2019-01-11 DIAGNOSIS — I10 Essential (primary) hypertension: Secondary | ICD-10-CM

## 2019-01-11 DIAGNOSIS — M05742 Rheumatoid arthritis with rheumatoid factor of left hand without organ or systems involvement: Secondary | ICD-10-CM

## 2019-01-11 DIAGNOSIS — K219 Gastro-esophageal reflux disease without esophagitis: Secondary | ICD-10-CM

## 2019-01-11 DIAGNOSIS — M05741 Rheumatoid arthritis with rheumatoid factor of right hand without organ or systems involvement: Secondary | ICD-10-CM

## 2019-01-11 DIAGNOSIS — M797 Fibromyalgia: Secondary | ICD-10-CM

## 2019-01-11 DIAGNOSIS — Z0000 Encounter for general adult medical examination without abnormal findings: Secondary | ICD-10-CM

## 2019-01-11 DIAGNOSIS — N63 Unspecified lump in unspecified breast: Secondary | ICD-10-CM

## 2019-01-11 LAB — POCT GLYCOSYLATED HEMOGLOBIN (HGB A1C): Hemoglobin A1C: 5.8 % — AB (ref 4.0–5.6)

## 2019-01-11 MED ORDER — CIPROFLOXACIN-FLUOCINOLONE PF 0.3-0.025 % OT SOLN: 0.2500 mL | Freq: Two times a day (BID) | OTIC | 0 refills | Status: DC

## 2019-01-11 MED ORDER — ATORVASTATIN CALCIUM 10 MG PO TABS: ORAL_TABLET | ORAL | 0 refills | Status: DC

## 2019-01-11 MED ORDER — LISINOPRIL 40 MG PO TABS: 40.0000 mg | ORAL_TABLET | Freq: Every day | ORAL | 0 refills | Status: DC

## 2019-01-11 MED ORDER — HYDROCHLOROTHIAZIDE 25 MG PO TABS: 25.0000 mg | ORAL_TABLET | Freq: Every day | ORAL | 0 refills | Status: DC

## 2019-01-11 MED ORDER — CIPROFLOXACIN HCL 0.2 % OT SOLN: 0.2000 mL | Freq: Two times a day (BID) | OTIC | 0 refills | Status: DC

## 2019-01-11 MED ORDER — DULOXETINE HCL 60 MG PO CPEP: 60.0000 mg | ORAL_CAPSULE | Freq: Every day | ORAL | 0 refills | Status: DC

## 2019-01-11 MED ORDER — CIPROFLOXACIN HCL 0.3 % OP SOLN: OPHTHALMIC | 0 refills | Status: DC

## 2019-01-11 NOTE — Assessment & Plan Note (Deleted)
Mammogram ordered

## 2019-01-11 NOTE — Assessment & Plan Note (Signed)
Diffuse musculoskeletal pain/weakness worsening She reports several falls in last couple months She has walker-however does not use it- advised to use it  She has not followed up with Rheumatologist in years, new referral placed

## 2019-01-11 NOTE — Assessment & Plan Note (Signed)
Duloxetine 60mg  QD

## 2019-01-11 NOTE — Patient Instructions (Addendum)
Preventive Care for Adults, Female  A healthy lifestyle and preventive care can promote health and wellness. Preventive health guidelines for women include the following key practices.   A routine yearly physical is a good way to check with your health care provider about your health and preventive screening. It is a chance to share any concerns and updates on your health and to receive a thorough exam.   Visit your dentist for a routine exam and preventive care every 6 months. Brush your teeth twice a day and floss once a day. Good oral hygiene prevents tooth decay and gum disease.   The frequency of eye exams is based on your age, health, family medical history, use of contact lenses, and other factors. Follow your health care provider's recommendations for frequency of eye exams.   Eat a healthy diet. Foods like vegetables, fruits, whole grains, low-fat dairy products, and lean protein foods contain the nutrients you need without too many calories. Decrease your intake of foods high in solid fats, added sugars, and salt. Eat the right amount of calories for you.Get information about a proper diet from your health care provider, if necessary.   Regular physical exercise is one of the most important things you can do for your health. Most adults should get at least 150 minutes of moderate-intensity exercise (any activity that increases your heart rate and causes you to sweat) each week. In addition, most adults need muscle-strengthening exercises on 2 or more days a week.   Maintain a healthy weight. The body mass index (BMI) is a screening tool to identify possible weight problems. It provides an estimate of body fat based on height and weight. Your health care provider can find your BMI, and can help you achieve or maintain a healthy weight.For adults 20 years and older:   - A BMI below 18.5 is considered underweight.   - A BMI of 18.5 to 24.9 is normal.   - A BMI of 25 to 29.9 is  considered overweight.   - A BMI of 30 and above is considered obese.   Maintain normal blood lipids and cholesterol levels by exercising and minimizing your intake of trans and saturated fats.  Eat a balanced diet with plenty of fruit and vegetables. Blood tests for lipids and cholesterol should begin at age 51 and be repeated every 5 years minimum.  If your lipid or cholesterol levels are high, you are over 40, or you are at high risk for heart disease, you may need your cholesterol levels checked more frequently.Ongoing high lipid and cholesterol levels should be treated with medicines if diet and exercise are not working.   If you smoke, find out from your health care provider how to quit. If you do not use tobacco, do not start.   Lung cancer screening is recommended for adults aged 29-80 years who are at high risk for developing lung cancer because of a history of smoking. A yearly low-dose CT scan of the lungs is recommended for people who have at least a 30-pack-year history of smoking and are a current smoker or have quit within the past 15 years. A pack year of smoking is smoking an average of 1 pack of cigarettes a day for 1 year (for example: 1 pack a day for 30 years or 2 packs a day for 15 years). Yearly screening should continue until the smoker has stopped smoking for at least 15 years. Yearly screening should be stopped for people who develop a  health problem that would prevent them from having lung cancer treatment.   If you are pregnant, do not drink alcohol. If you are breastfeeding, be very cautious about drinking alcohol. If you are not pregnant and choose to drink alcohol, do not have more than 1 drink per day. One drink is considered to be 12 ounces (355 mL) of beer, 5 ounces (148 mL) of wine, or 1.5 ounces (44 mL) of liquor.   Avoid use of street drugs. Do not share needles with anyone. Ask for help if you need support or instructions about stopping the use of  drugs.   High blood pressure causes heart disease and increases the risk of stroke. Your blood pressure should be checked at least yearly.  Ongoing high blood pressure should be treated with medicines if weight loss and exercise do not work.   If you are 69-55 years old, ask your health care provider if you should take aspirin to prevent strokes.   Diabetes screening involves taking a blood sample to check your fasting blood sugar level. This should be done once every 3 years, after age 38, if you are within normal weight and without risk factors for diabetes. Testing should be considered at a younger age or be carried out more frequently if you are overweight and have at least 1 risk factor for diabetes.   Breast cancer screening is essential preventive care for women. You should practice "breast self-awareness."  This means understanding the normal appearance and feel of your breasts and may include breast self-examination.  Any changes detected, no matter how small, should be reported to a health care provider.  Women in their 80s and 30s should have a clinical breast exam (CBE) by a health care provider as part of a regular health exam every 1 to 3 years.  After age 66, women should have a CBE every year.  Starting at age 1, women should consider having a mammogram (breast X-ray test) every year.  Women who have a family history of breast cancer should talk to their health care provider about genetic screening.  Women at a high risk of breast cancer should talk to their health care providers about having an MRI and a mammogram every year.   -Breast cancer gene (BRCA)-related cancer risk assessment is recommended for women who have family members with BRCA-related cancers. BRCA-related cancers include breast, ovarian, tubal, and peritoneal cancers. Having family members with these cancers may be associated with an increased risk for harmful changes (mutations) in the breast cancer genes BRCA1 and  BRCA2. Results of the assessment will determine the need for genetic counseling and BRCA1 and BRCA2 testing.   The Pap test is a screening test for cervical cancer. A Pap test can show cell changes on the cervix that might become cervical cancer if left untreated. A Pap test is a procedure in which cells are obtained and examined from the lower end of the uterus (cervix).   - Women should have a Pap test starting at age 57.   - Between ages 90 and 70, Pap tests should be repeated every 2 years.   - Beginning at age 63, you should have a Pap test every 3 years as long as the past 3 Pap tests have been normal.   - Some women have medical problems that increase the chance of getting cervical cancer. Talk to your health care provider about these problems. It is especially important to talk to your health care provider if a  new problem develops soon after your last Pap test. In these cases, your health care provider may recommend more frequent screening and Pap tests.   - The above recommendations are the same for women who have or have not gotten the vaccine for human papillomavirus (HPV).   - If you had a hysterectomy for a problem that was not cancer or a condition that could lead to cancer, then you no longer need Pap tests. Even if you no longer need a Pap test, a regular exam is a good idea to make sure no other problems are starting.   - If you are between ages 49 and 32 years, and you have had normal Pap tests going back 10 years, you no longer need Pap tests. Even if you no longer need a Pap test, a regular exam is a good idea to make sure no other problems are starting.   - If you have had past treatment for cervical cancer or a condition that could lead to cancer, you need Pap tests and screening for cancer for at least 20 years after your treatment.   - If Pap tests have been discontinued, risk factors (such as a new sexual partner) need to be reassessed to determine if screening should  be resumed.   - The HPV test is an additional test that may be used for cervical cancer screening. The HPV test looks for the virus that can cause the cell changes on the cervix. The cells collected during the Pap test can be tested for HPV. The HPV test could be used to screen women aged 39 years and older, and should be used in women of any age who have unclear Pap test results. After the age of 66, women should have HPV testing at the same frequency as a Pap test.   Colorectal cancer can be detected and often prevented. Most routine colorectal cancer screening begins at the age of 3 years and continues through age 28 years. However, your health care provider may recommend screening at an earlier age if you have risk factors for colon cancer. On a yearly basis, your health care provider may provide home test kits to check for hidden blood in the stool.  Use of a small camera at the end of a tube, to directly examine the colon (sigmoidoscopy or colonoscopy), can detect the earliest forms of colorectal cancer. Talk to your health care provider about this at age 40, when routine screening begins. Direct exam of the colon should be repeated every 5 -10 years through age 50 years, unless early forms of pre-cancerous polyps or small growths are found.   People who are at an increased risk for hepatitis B should be screened for this virus. You are considered at high risk for hepatitis B if:  -You were born in a country where hepatitis B occurs often. Talk with your health care provider about which countries are considered high risk.  - Your parents were born in a high-risk country and you have not received a shot to protect against hepatitis B (hepatitis B vaccine).  - You have HIV or AIDS.  - You use needles to inject street drugs.  - You live with, or have sex with, someone who has Hepatitis B.  - You get hemodialysis treatment.  - You take certain medicines for conditions like cancer, organ  transplantation, and autoimmune conditions.   Hepatitis C blood testing is recommended for all people born from [birth year] through 1965 and any individual  with known risks for hepatitis C.   Practice safe sex. Use condoms and avoid high-risk sexual practices to reduce the spread of sexually transmitted infections (STIs). STIs include gonorrhea, chlamydia, syphilis, trichomonas, herpes, HPV, and human immunodeficiency virus (HIV). Herpes, HIV, and HPV are viral illnesses that have no cure. They can result in disability, cancer, and death. Sexually active women aged 25 years and younger should be checked for chlamydia. Older women with new or multiple partners should also be tested for chlamydia. Testing for other STIs is recommended if you are sexually active and at increased risk.   Osteoporosis is a disease in which the bones lose minerals and strength with aging. This can result in serious bone fractures or breaks. The risk of osteoporosis can be identified using a bone density scan. Women ages 65 years and over and women at risk for fractures or osteoporosis should discuss screening with their health care providers. Ask your health care provider whether you should take a calcium supplement or vitamin D to There are also several preventive steps women can take to avoid osteoporosis and resulting fractures or to keep osteoporosis from worsening. -->Recommendations include:  Eat a balanced diet high in fruits, vegetables, calcium, and vitamins.  Get enough calcium. The recommended total intake of is 1,200 mg daily; for best absorption, if taking supplements, divide doses into 250-500 mg doses throughout the day. Of the two types of calcium, calcium carbonate is best absorbed when taken with food but calcium citrate can be taken on an empty stomach.  Get enough vitamin D. NAMS and the National Osteoporosis Foundation recommend at least 1,000 IU per day for women age 50 and over who are at risk of vitamin D  deficiency. Vitamin D deficiency can be caused by inadequate sun exposure (for example, those who live in northern latitudes).  Avoid alcohol and smoking. Heavy alcohol intake (more than 7 drinks per week) increases the risk of falls and hip fracture and women smokers tend to lose bone more rapidly and have lower bone mass than nonsmokers. Stopping smoking is one of the most important changes women can make to improve their health and decrease risk for disease.  Be physically active every day. Weight-bearing exercise (for example, fast walking, hiking, jogging, and weight training) may strengthen bones or slow the rate of bone loss that comes with aging. Balancing and muscle-strengthening exercises can reduce the risk of falling and fracture.  Consider therapeutic medications. Currently, several types of effective drugs are available. Healthcare providers can recommend the type most appropriate for each woman.  Eliminate environmental factors that may contribute to accidents. Falls cause nearly 90% of all osteoporotic fractures, so reducing this risk is an important bone-health strategy. Measures include ample lighting, removing obstructions to walking, using nonskid rugs on floors, and placing mats and/or grab bars in showers.  Be aware of medication side effects. Some common medicines make bones weaker. These include a type of steroid drug called glucocorticoids used for arthritis and asthma, some antiseizure drugs, certain sleeping pills, treatments for endometriosis, and some cancer drugs. An overactive thyroid gland or using too much thyroid hormone for an underactive thyroid can also be a problem. If you are taking these medicines, talk to your doctor about what you can do to help protect your bones.reduce the rate of osteoporosis.    Menopause can be associated with physical symptoms and risks. Hormone replacement therapy is available to decrease symptoms and risks. You should talk to your  health care provider   about whether hormone replacement therapy is right for you.   Use sunscreen. Apply sunscreen liberally and repeatedly throughout the day. You should seek shade when your shadow is shorter than you. Protect yourself by wearing long sleeves, pants, a wide-brimmed hat, and sunglasses year round, whenever you are outdoors.   Once a month, do a whole body skin exam, using a mirror to look at the skin on your back. Tell your health care provider of new moles, moles that have irregular borders, moles that are larger than a pencil eraser, or moles that have changed in shape or color.   -Stay current with required vaccines (immunizations).   Influenza vaccine. All adults should be immunized every year.  Tetanus, diphtheria, and acellular pertussis (Td, Tdap) vaccine. Pregnant women should receive 1 dose of Tdap vaccine during each pregnancy. The dose should be obtained regardless of the length of time since the last dose. Immunization is preferred during the 27th 36th week of gestation. An adult who has not previously received Tdap or who does not know her vaccine status should receive 1 dose of Tdap. This initial dose should be followed by tetanus and diphtheria toxoids (Td) booster doses every 10 years. Adults with an unknown or incomplete history of completing a 3-dose immunization series with Td-containing vaccines should begin or complete a primary immunization series including a Tdap dose. Adults should receive a Td booster every 10 years.  Varicella vaccine. An adult without evidence of immunity to varicella should receive 2 doses or a second dose if she has previously received 1 dose. Pregnant females who do not have evidence of immunity should receive the first dose after pregnancy. This first dose should be obtained before leaving the health care facility. The second dose should be obtained 4 8 weeks after the first dose.  Human papillomavirus (HPV) vaccine. Females aged 13 26  years who have not received the vaccine previously should obtain the 3-dose series. The vaccine is not recommended for use in pregnant females. However, pregnancy testing is not needed before receiving a dose. If a female is found to be pregnant after receiving a dose, no treatment is needed. In that case, the remaining doses should be delayed until after the pregnancy. Immunization is recommended for any person with an immunocompromised condition through the age of 26 years if she did not get any or all doses earlier. During the 3-dose series, the second dose should be obtained 4 8 weeks after the first dose. The third dose should be obtained 24 weeks after the first dose and 16 weeks after the second dose.  Zoster vaccine. One dose is recommended for adults aged 60 years or older unless certain conditions are present.  Measles, mumps, and rubella (MMR) vaccine. Adults born before 1957 generally are considered immune to measles and mumps. Adults born in 1957 or later should have 1 or more doses of MMR vaccine unless there is a contraindication to the vaccine or there is laboratory evidence of immunity to each of the three diseases. A routine second dose of MMR vaccine should be obtained at least 28 days after the first dose for students attending postsecondary schools, health care workers, or international travelers. People who received inactivated measles vaccine or an unknown type of measles vaccine during 1963 1967 should receive 2 doses of MMR vaccine. People who received inactivated mumps vaccine or an unknown type of mumps vaccine before 1979 and are at high risk for mumps infection should consider immunization with 2 doses of   MMR vaccine. For females of childbearing age, rubella immunity should be determined. If there is no evidence of immunity, females who are not pregnant should be vaccinated. If there is no evidence of immunity, females who are pregnant should delay immunization until after pregnancy.  Unvaccinated health care workers born before [birth year] who lack laboratory evidence of measles, mumps, or rubella immunity or laboratory confirmation of disease should consider measles and mumps immunization with 2 doses of MMR vaccine or rubella immunization with 1 dose of MMR vaccine.  Pneumococcal 13-valent conjugate (PCV13) vaccine. When indicated, a person who is uncertain of her immunization history and has no record of immunization should receive the PCV13 vaccine. An adult aged 54 years or older who has certain medical conditions and has not been previously immunized should receive 1 dose of PCV13 vaccine. This PCV13 should be followed with a dose of pneumococcal polysaccharide (PPSV23) vaccine. The PPSV23 vaccine dose should be obtained at least 8 weeks after the dose of PCV13 vaccine. An adult aged 58 years or older who has certain medical conditions and previously received 1 or more doses of PPSV23 vaccine should receive 1 dose of PCV13. The PCV13 vaccine dose should be obtained 1 or more years after the last PPSV23 vaccine dose.  Pneumococcal polysaccharide (PPSV23) vaccine. When PCV13 is also indicated, PCV13 should be obtained first. All adults aged 58 years and older should be immunized. An adult younger than age 65 years who has certain medical conditions should be immunized. Any person who resides in a nursing home or long-term care facility should be immunized. An adult smoker should be immunized. People with an immunocompromised condition and certain other conditions should receive both PCV13 and PPSV23 vaccines. People with human immunodeficiency virus (HIV) infection should be immunized as soon as possible after diagnosis. Immunization during chemotherapy or radiation therapy should be avoided. Routine use of PPSV23 vaccine is not recommended for American Indians, [place] Natives, or people younger than 65 years unless there are medical conditions that require PPSV23 vaccine. When indicated,  people who have unknown immunization and have no record of immunization should receive PPSV23 vaccine. One-time revaccination 5 years after the first dose of PPSV23 is recommended for people aged 70 64 years who have chronic kidney failure, nephrotic syndrome, asplenia, or immunocompromised conditions. People who received 1 2 doses of PPSV23 before age 32 years should receive another dose of PPSV23 vaccine at age 96 years or later if at least 5 years have passed since the previous dose. Doses of PPSV23 are not needed for people immunized with PPSV23 at or after age 55 years.  Meningococcal vaccine. Adults with asplenia or persistent complement component deficiencies should receive 2 doses of quadrivalent meningococcal conjugate (MenACWY-D) vaccine. The doses should be obtained at least 2 months apart. Microbiologists working with certain meningococcal bacteria, [employer] recruits, people at risk during an outbreak, and people who travel to or live in countries with a high rate of meningitis should be immunized. A first-year college student up through age 58 years who is living in a residence hall should receive a dose if she did not receive a dose on or after her 16th birthday. Adults who have certain high-risk conditions should receive one or more doses of vaccine.  Hepatitis A vaccine. Adults who wish to be protected from this disease, have certain high-risk conditions, work with hepatitis A-infected animals, work in hepatitis A research labs, or travel to or work in countries with a high rate of hepatitis A should be  immunized. Adults who were previously unvaccinated and who anticipate close contact with an international adoptee during the first 60 days after arrival in the [place] from a country with a high rate of hepatitis A should be immunized.  Hepatitis B vaccine.  Adults who wish to be protected from this disease, have certain high-risk conditions, may be exposed to blood or other infectious  body fluids, are household contacts or sex partners of hepatitis B positive people, are clients or workers in certain care facilities, or travel to or work in countries with a high rate of hepatitis B should be immunized.  Haemophilus influenzae type b (Hib) vaccine. A previously unvaccinated person with asplenia or sickle cell disease or having a scheduled splenectomy should receive 1 dose of Hib vaccine. Regardless of previous immunization, a recipient of a hematopoietic stem cell transplant should receive a 3-dose series 6 12 months after her successful transplant. Hib vaccine is not recommended for adults with HIV infection.  Preventive Services / Frequency Ages 6 to 39years  Blood pressure check.** / Every 1 to 2 years.  Lipid and cholesterol check.** / Every 5 years beginning at age 39.  Clinical breast exam.** / Every 3 years for women in their 61s and 62s.  BRCA-related cancer risk assessment.** / For women who have family members with a BRCA-related cancer (breast, ovarian, tubal, or peritoneal cancers).  Pap test.** / Every 2 years from ages 47 through 85. Every 3 years starting at age 34 through age 12 or 74 with a history of 3 consecutive normal Pap tests.  HPV screening.** / Every 3 years from ages 46 through ages 43 to 54 with a history of 3 consecutive normal Pap tests.  Hepatitis C blood test.** / For any individual with known risks for hepatitis C.  Skin self-exam. / Monthly.  Influenza vaccine. / Every year.  Tetanus, diphtheria, and acellular pertussis (Tdap, Td) vaccine.** / Consult your health care provider. Pregnant women should receive 1 dose of Tdap vaccine during each pregnancy. 1 dose of Td every 10 years.  Varicella vaccine.** / Consult your health care provider. Pregnant females who do not have evidence of immunity should receive the first dose after pregnancy.  HPV vaccine. / 3 doses over 6 months, if 64 and younger. The vaccine is not recommended for use in  pregnant females. However, pregnancy testing is not needed before receiving a dose.  Measles, mumps, rubella (MMR) vaccine.** / You need at least 1 dose of MMR if you were born in 1957 or later. You may also need a 2nd dose. For females of childbearing age, rubella immunity should be determined. If there is no evidence of immunity, females who are not pregnant should be vaccinated. If there is no evidence of immunity, females who are pregnant should delay immunization until after pregnancy.  Pneumococcal 13-valent conjugate (PCV13) vaccine.** / Consult your health care provider.  Pneumococcal polysaccharide (PPSV23) vaccine.** / 1 to 2 doses if you smoke cigarettes or if you have certain conditions.  Meningococcal vaccine.** / 1 dose if you are age 71 to 38 years and a Market researcher living in a residence hall, or have one of several medical conditions, you need to get vaccinated against meningococcal disease. You may also need additional booster doses.  Hepatitis A vaccine.** / Consult your health care provider.  Hepatitis B vaccine.** / Consult your health care provider.  Haemophilus influenzae type b (Hib) vaccine.** / Consult your health care provider.  Ages 55 to 64years  Blood pressure check.** / Every 1 to 2 years.  Lipid and cholesterol check.** / Every 5 years beginning at age 20 years.  Lung cancer screening. / Every year if you are aged 55 80 years and have a 30-pack-year history of smoking and currently smoke or have quit within the past 15 years. Yearly screening is stopped once you have quit smoking for at least 15 years or develop a health problem that would prevent you from having lung cancer treatment.  Clinical breast exam.** / Every year after age 40 years.  BRCA-related cancer risk assessment.** / For women who have family members with a BRCA-related cancer (breast, ovarian, tubal, or peritoneal cancers).  Mammogram.** / Every year beginning at age 40  years and continuing for as long as you are in good health. Consult with your health care provider.  Pap test.** / Every 3 years starting at age 30 years through age 65 or 70 years with a history of 3 consecutive normal Pap tests.  HPV screening.** / Every 3 years from ages 30 years through ages 65 to 70 years with a history of 3 consecutive normal Pap tests.  Fecal occult blood test (FOBT) of stool. / Every year beginning at age 50 years and continuing until age 75 years. You may not need to do this test if you get a colonoscopy every 10 years.  Flexible sigmoidoscopy or colonoscopy.** / Every 5 years for a flexible sigmoidoscopy or every 10 years for a colonoscopy beginning at age 50 years and continuing until age 75 years.  Hepatitis C blood test.** / For all people born from 1945 through 1965 and any individual with known risks for hepatitis C.  Skin self-exam. / Monthly.  Influenza vaccine. / Every year.  Tetanus, diphtheria, and acellular pertussis (Tdap/Td) vaccine.** / Consult your health care provider. Pregnant women should receive 1 dose of Tdap vaccine during each pregnancy. 1 dose of Td every 10 years.  Varicella vaccine.** / Consult your health care provider. Pregnant females who do not have evidence of immunity should receive the first dose after pregnancy.  Zoster vaccine.** / 1 dose for adults aged 60 years or older.  Measles, mumps, rubella (MMR) vaccine.** / You need at least 1 dose of MMR if you were born in 1957 or later. You may also need a 2nd dose. For females of childbearing age, rubella immunity should be determined. If there is no evidence of immunity, females who are not pregnant should be vaccinated. If there is no evidence of immunity, females who are pregnant should delay immunization until after pregnancy.  Pneumococcal 13-valent conjugate (PCV13) vaccine.** / Consult your health care provider.  Pneumococcal polysaccharide (PPSV23) vaccine.** / 1 to 2 doses if  you smoke cigarettes or if you have certain conditions.  Meningococcal vaccine.** / Consult your health care provider.  Hepatitis A vaccine.** / Consult your health care provider.  Hepatitis B vaccine.** / Consult your health care provider.  Haemophilus influenzae type b (Hib) vaccine.** / Consult your health care provider.  Ages 65 years and over  Blood pressure check.** / Every 1 to 2 years.  Lipid and cholesterol check.** / Every 5 years beginning at age 20 years.  Lung cancer screening. / Every year if you are aged 55 80 years and have a 30-pack-year history of smoking and currently smoke or have quit within the past 15 years. Yearly screening is stopped once you have quit smoking for at least 15 years or develop a health problem that   would prevent you from having lung cancer treatment.  Clinical breast exam.** / Every year after age 103 years.  BRCA-related cancer risk assessment.** / For women who have family members with a BRCA-related cancer (breast, ovarian, tubal, or peritoneal cancers).  Mammogram.** / Every year beginning at age 36 years and continuing for as long as you are in good health. Consult with your health care provider.  Pap test.** / Every 3 years starting at age 5 years through age 85 or 10 years with 3 consecutive normal Pap tests. Testing can be stopped between 65 and 70 years with 3 consecutive normal Pap tests and no abnormal Pap or HPV tests in the past 10 years.  HPV screening.** / Every 3 years from ages 93 years through ages 70 or 45 years with a history of 3 consecutive normal Pap tests. Testing can be stopped between 65 and 70 years with 3 consecutive normal Pap tests and no abnormal Pap or HPV tests in the past 10 years.  Fecal occult blood test (FOBT) of stool. / Every year beginning at age 8 years and continuing until age 45 years. You may not need to do this test if you get a colonoscopy every 10 years.  Flexible sigmoidoscopy or colonoscopy.** /  Every 5 years for a flexible sigmoidoscopy or every 10 years for a colonoscopy beginning at age 69 years and continuing until age 68 years.  Hepatitis C blood test.** / For all people born from [birth year] through 1964 and any individual with known risks for hepatitis C.  Osteoporosis screening.** / A one-time screening for women ages 7 years and over and women at risk for fractures or osteoporosis.  Skin self-exam. / Monthly.  Influenza vaccine. / Every year.  Tetanus, diphtheria, and acellular pertussis (Tdap/Td) vaccine.** / 1 dose of Td every 10 years.  Varicella vaccine.** / Consult your health care provider.  Zoster vaccine.** / 1 dose for adults aged 5 years or older.  Pneumococcal 13-valent conjugate (PCV13) vaccine.** / Consult your health care provider.  Pneumococcal polysaccharide (PPSV23) vaccine.** / 1 dose for all adults aged 74 years and older.  Meningococcal vaccine.** / Consult your health care provider.  Hepatitis A vaccine.** / Consult your health care provider.  Hepatitis B vaccine.** / Consult your health care provider.  Haemophilus influenzae type b (Hib) vaccine.** / Consult your health care provider. ** Family history and personal history of risk and conditions may change your health care provider's recommendations. Document Released: 06/24/2001 Document Revised: 02/16/2013  [facility] Patient Information 2014 [place], [place].   EXERCISE AND DIET:  We recommended that you start or continue a regular exercise program for good health. Regular exercise means any activity that makes your heart beat faster and makes you sweat.  We recommend exercising at least 30 minutes per day at least 3 days a week, preferably 5.  We also recommend a diet low in fat and sugar / carbohydrates.  Inactivity, poor dietary choices and obesity can cause diabetes, heart attack, stroke, and kidney damage, among others.     ALCOHOL AND SMOKING:  Women should limit their alcohol intake to no  more than 7 drinks/beers/glasses of wine (combined, not each!) per week. Moderation of alcohol intake to this level decreases your risk of breast cancer and liver damage.  ( And of course, no recreational drugs are part of a healthy lifestyle.)  Also, you should not be smoking at all or even being exposed to second hand smoke. Most people know smoking can  cause cancer, and various heart and lung diseases, but did you know it also contributes to weakening of your bones?  Aging of your skin?  Yellowing of your teeth and nails?   CALCIUM AND VITAMIN D:  Adequate intake of calcium and Vitamin D are recommended.  The recommendations for exact amounts of these supplements seem to change often, but generally speaking 600 mg of calcium (either carbonate or citrate) and 800 units of Vitamin D per day seems prudent. Certain women may benefit from higher intake of Vitamin D.  If you are among these women, your doctor will have told you during your visit.     PAP SMEARS:  Pap smears, to check for cervical cancer or precancers,  have traditionally been done yearly, although recent scientific advances have shown that most women can have pap smears less often.  However, every woman still should have a physical exam from her gynecologist or primary care physician every year. It will include a breast check, inspection of the vulva and vagina to check for abnormal growths or skin changes, a visual exam of the cervix, and then an exam to evaluate the size and shape of the uterus and ovaries.  And after 57 years of age, a rectal exam is indicated to check for rectal cancers. We will also provide age appropriate advice regarding health maintenance, like when you should have certain vaccines, screening for sexually transmitted diseases, bone density testing, colonoscopy, mammograms, etc.    MAMMOGRAMS:  All women over 50 years old should have a yearly mammogram. Many facilities now offer a "3D" mammogram, which may cost  around $50 extra out of pocket. If possible,  we recommend you accept the option to have the 3D mammogram performed.  It both reduces the number of women who will be called back for extra views which then turn out to be normal, and it is better than the routine mammogram at detecting truly abnormal areas.     COLONOSCOPY:  Colonoscopy to screen for colon cancer is recommended for all women at age 42.  We know, you hate the idea of the prep.  We agree, BUT, having colon cancer and not knowing it is worse!!  Colon cancer so often starts as a polyp that can be seen and removed at colonscopy, which can quite literally save your life!  And if your first colonoscopy is normal and you have no family history of colon cancer, most women don't have to have it again for 10 years.  Once every ten years, you can do something that may end up saving your life, right?  We will be happy to help you get it scheduled when you are ready.  Be sure to check your insurance coverage so you understand how much it will cost.  It may be covered as a preventative service at no cost, but you should check your particular policy.   A1c- 5.8, improved from 6.0 in Feb. Referrals placed to Rheumatology and Gastroenterology- imperative that you follow up with these specialists  Continue all medications as directed- refills sent in, reduced Hydrochlorothiazide to 38m once daily. Please use walker when needed, especially at night. Please schedule fasting lab appt in next 1-2 weeks, will re-start Atorvastatin if indicated. Continue to follow heart healthy diet and ensure that you avoid foods that are difficult to swallow. Follow-up here in 3 months. Continue to social distance and wear a mask when in public. GREAT TO SEE YOU!

## 2019-01-11 NOTE — Assessment & Plan Note (Addendum)
BP at goa 113/72, HR 75 Continue Lisinopril 40mg  QD, reduce HCTZ from 50mg  to 25mg  QD

## 2019-01-11 NOTE — Assessment & Plan Note (Signed)
06/29/2018 The 10-year ASCVD risk score Mikey Bussing DC Brooke Bonito., et al., 2013) is: 2.6%   Values used to calculate the score:     Age: 58 years     Sex: Female     Is Non-Hispanic African American: No     Diabetic: No     Tobacco smoker: No     Systolic Blood Pressure: 123456 mmHg     Is BP treated: Yes     HDL Cholesterol: 57 mg/dL     Total Cholesterol: 236 mg/dL LDL-155 Has been off Atorvastatin 10mg  for months  Future Lipids ordered

## 2019-01-11 NOTE — Assessment & Plan Note (Addendum)
A1c- 5.8, improved from 6.0 in Feb. Referrals placed to Rheumatology and Gastroenterology- imperative that you follow up with these specialists  Continue all medications as directed- refills sent in, reduced Hydrochlorothiazide to 25mg  once daily. Please use walker when needed, especially at night. Please schedule fasting lab appt in next 1-2 weeks, will re-start Atorvastatin if indicated. Continue to follow heart healthy diet and ensure that you avoid foods that are difficult to swallow. Mammogram ordered  Follow-up here in 3 months. Continue to social distance and wear a mask when in public.

## 2019-01-11 NOTE — Assessment & Plan Note (Signed)
Increasing in sx severity and frequency- needs to f/u with GI Referral placed

## 2019-01-11 NOTE — Telephone Encounter (Signed)
Called [facility] to DC Atorvastatin and be sure they were aware of decreased dosage of HCTZ to 25mg  to one tablet QD.  Pharmacist states that ciprofloxacin otic is going to cost the pt $96 and request RX for ophthalmic solution to save the pt money.  RX resent to pharmacy for ophthalmic solution.  Charyl Bigger, CMA

## 2019-01-12 ENCOUNTER — Encounter: Payer: Self-pay | Admitting: Adult Health

## 2019-01-12 LAB — COMPREHENSIVE METABOLIC PANEL
ALT: 14 IU/L (ref 0–32)
AST: 17 IU/L (ref 0–40)
Albumin/Globulin Ratio: 1.8 (ref 1.2–2.2)
Albumin: 4.4 g/dL (ref 3.8–4.9)
Alkaline Phosphatase: 93 IU/L (ref 39–117)
BUN/Creatinine Ratio: 18 (ref 9–23)
BUN: 22 mg/dL (ref 6–24)
Bilirubin Total: 0.3 mg/dL (ref 0.0–1.2)
CO2: 26 mmol/L (ref 20–29)
Calcium: 9.8 mg/dL (ref 8.7–10.2)
Chloride: 101 mmol/L (ref 96–106)
Creatinine, Ser: 1.2 mg/dL — ABNORMAL HIGH (ref 0.57–1.00)
GFR calc Af Amer: 58 mL/min/{1.73_m2} — ABNORMAL LOW (ref >59)
GFR calc non Af Amer: 51 mL/min/{1.73_m2} — ABNORMAL LOW (ref >59)
Globulin, Total: 2.4 g/dL (ref 1.5–4.5)
Glucose: 108 mg/dL — ABNORMAL HIGH (ref 65–99)
Potassium: 3.8 mmol/L (ref 3.5–5.2)
Sodium: 141 mmol/L (ref 134–144)
Total Protein: 6.8 g/dL (ref 6.0–8.5)

## 2019-01-16 ENCOUNTER — Encounter (HOSPITAL_COMMUNITY): Payer: Self-pay

## 2019-01-16 ENCOUNTER — Emergency Department (HOSPITAL_COMMUNITY)
Admission: EM | Admit: 2019-01-16 | Discharge: 2019-01-16 | Disposition: A | Discharge status: Home / Self Care | Payer: Self-pay | Attending: Emergency Medicine | Admitting: Emergency Medicine

## 2019-01-16 ENCOUNTER — Other Ambulatory Visit: Payer: Self-pay

## 2019-01-16 DIAGNOSIS — I10 Essential (primary) hypertension: Secondary | ICD-10-CM | POA: Insufficient documentation

## 2019-01-16 DIAGNOSIS — Z96652 Presence of left artificial knee joint: Secondary | ICD-10-CM | POA: Insufficient documentation

## 2019-01-16 DIAGNOSIS — T148XXA Other injury of unspecified body region, initial encounter: Secondary | ICD-10-CM

## 2019-01-16 DIAGNOSIS — X58XXXA Exposure to other specified factors, initial encounter: Secondary | ICD-10-CM | POA: Insufficient documentation

## 2019-01-16 DIAGNOSIS — S50812A Abrasion of left forearm, initial encounter: Secondary | ICD-10-CM | POA: Insufficient documentation

## 2019-01-16 DIAGNOSIS — Y939 Activity, unspecified: Secondary | ICD-10-CM | POA: Insufficient documentation

## 2019-01-16 DIAGNOSIS — Y929 Unspecified place or not applicable: Secondary | ICD-10-CM | POA: Insufficient documentation

## 2019-01-16 DIAGNOSIS — L03116 Cellulitis of left lower limb: Secondary | ICD-10-CM | POA: Insufficient documentation

## 2019-01-16 DIAGNOSIS — Y999 Unspecified external cause status: Secondary | ICD-10-CM | POA: Insufficient documentation

## 2019-01-16 DIAGNOSIS — M069 Rheumatoid arthritis, unspecified: Secondary | ICD-10-CM | POA: Insufficient documentation

## 2019-01-16 DIAGNOSIS — Z79899 Other long term (current) drug therapy: Secondary | ICD-10-CM | POA: Insufficient documentation

## 2019-01-16 MED ORDER — SULFAMETHOXAZOLE-TRIMETHOPRIM 800-160 MG PO TABS
1.0000 | ORAL_TABLET | Freq: Once | ORAL | Status: AC
  Administered 2019-01-16: 1 via ORAL
  Filled 2019-01-16: qty 1

## 2019-01-16 MED ORDER — SULFAMETHOXAZOLE-TRIMETHOPRIM 800-160 MG PO TABS: 1.0000 | ORAL_TABLET | Freq: Two times a day (BID) | ORAL | 0 refills | Status: AC

## 2019-01-16 NOTE — ED Triage Notes (Signed)
Pt endorses possible spider bite to left posterior upper thigh/buttocks x 2 days. Pt states that it was a bump the size of a pen hole initially and now the redness has spread to a large area on the posterior upper thigh, warm and hard to touch. VSS.

## 2019-01-16 NOTE — ED Provider Notes (Addendum)
[facility] EMERGENCY DEPARTMENT Provider Note   CSN: [id number] Arrival date & time: 01/16/19  1314     History   Chief Complaint Chief Complaint  Patient presents with   Insect Bite    HPI [name] is a 58 y.o. female patient presenting for pain and swelling of the left posterior upper thigh and lower buttocks that began 2 days ago.  Patient is unsure what has caused this pain and swelling she denies any known inciting factor.  She reports the area started out as a small area less than 1 cm in diameter and is subsequently grown.  She describes a moderate intensity throbbing sensation constant worsened with sitting and improved with standing she denies any radiation of her pain.  She denies history of similar symptoms in the past.  Patient reports that she is otherwise feeling well today and has no additional concerns.  She denies fever/chills, headache, nausea/vomiting, abdominal pain, chest pain/shortness of breath, numbness/weakness, tingling of the extremities, rectal pain, blood in the stool or any additional concerns.    HPI  Past Medical History:  Diagnosis Date   Acid reflux    takes Zantac and Omeprazole daily   Anxiety    takes Citaopram daily   Arthritis    right knee   Chronic back pain    DDD   Clotting disorder (HCC)    hx of blood clot following knee scope   History of blood clots    65yrs ago and in left leg   History of bronchitis 3+yrs ago   Hyperlipidemia    takes Atorvastatin daily   Hypertension    takes Lisinopril and HCTZ daily   Insomnia    takes Elavil nightly as needed   Joint pain    Joint swelling    Paraesophageal hernia    Weakness    numbness and tingling in both feet    Patient Active Problem List   Diagnosis Date Noted   Restless legs syndrome (RLS) 06/30/2018   Arthrosis of first carpometacarpal joint 02/11/2018   Primary osteoarthritis of first carpometacarpal joint of left hand    Pain  in left hand 12/23/2017   Healthcare maintenance 11/16/2017   Tinea versicolor 11/16/2017   Chronic right shoulder pain 09/02/2017   Fibromyalgia 08/11/2017   Family history of diabetes mellitus 06/23/2016   Other fatigue 06/23/2016   Insomnia 06/23/2016   Rheumatoid arthritis ([place]) 06/23/2016   Nontraumatic incomplete tear of right rotator cuff 04/28/2016   Impingement syndrome of right shoulder 03/31/2016   Status post right partial knee replacement 09/15/2014   Acute medial meniscal injury of knee 06/14/2014   Status post total knee replacement using cement 06/08/2014   Primary osteoarthritis of right knee 06/08/2014   Screening for colon cancer    Schatzki's ring    Hiatal hernia    Dysphagia, pharyngoesophageal phase 05/26/2014   Encounter for screening colonoscopy 05/26/2014   Adrenal adenoma 05/01/2014   Breast lump 05/01/2014   Metatarsalgia of both feet 01/12/2014   Bilateral leg pain 01/11/2014   Bilateral edema of lower extremity 01/11/2014   Other osteoarthritis of spine, thoracolumbar region 12/26/2013   Hypercholesteremia 05/19/2013   Periodic health assessment, general screening, adult 04/13/2013   GERD (gastroesophageal reflux disease) 04/13/2013   HTN (hypertension) 04/13/2013   Dyspnea 04/13/2013    Past Surgical History:  Procedure Laterality Date   BREAST BIOPSY Left    BUNIONECTOMY Right 02/18/2018   Procedure: Yehuda Budd;  Surgeon: Amalia Hailey,  Dorathy Daft, DPM;  Location: [facility];  Service: Podiatry;  Laterality: Right;   CAPSULOTOMY Bilateral 02/18/2018   Procedure: CAPSULOTOMY MPJ RELEASE JOINT 2N BILATERAL;  Surgeon: Edrick Kins, DPM;  Location: [facility];  Service: Podiatry;  Laterality: Bilateral;   CARPOMETACARPEL SUSPENSION PLASTY Left 01/27/2018   Procedure: LEFT THUMB ligament reconstruction and tendon interposition;  Surgeon: Leandrew Koyanagi, MD;  Location: [facility];  Service: Orthopedics;   Laterality: Left;   CHONDROPLASTY Right 06/28/2014   Procedure: CHONDROPLASTY;  Surgeon: Marianna Payment, MD;  Location: [facility];  Service: Orthopedics;  Laterality: Right;   COLONOSCOPY N/A 05/29/2014   Procedure: COLONOSCOPY;  Surgeon: Daneil Dolin, MD;  [facility];  Service: Endoscopy;  Laterality: N/A;  215pm- Pt is working until 12:00 so she can't come any earlier   ESOPHAGOGASTRODUODENOSCOPY N/A 05/29/2014   Procedure: ESOPHAGOGASTRODUODENOSCOPY (EGD);  Surgeon: Daneil Dolin, MD;  [facility];  Service: Endoscopy;  Laterality: N/A;   GANGLION CYST EXCISION Left 01/13/2002   HAMMER TOE SURGERY Bilateral 02/18/2018   Procedure: HAMMER TOE CORRECTION2ND BILATERAL;  Surgeon: Edrick Kins, DPM;  Location: [facility];  Service: Podiatry;  Laterality: Bilateral;   KNEE ARTHROSCOPY WITH MEDIAL MENISECTOMY Right 06/28/2014   Procedure: RIGHT KNEE ARTHROSCOPY WITH PARTIAL MEDIAL MENISCECTOMY AND CHONDROPLASTY;  Surgeon: Marianna Payment, MD;  Location: [facility];  Service: Orthopedics;  Laterality: Right;   MALONEY DILATION N/A 05/29/2014   Procedure: Venia Minks DILATION;  Surgeon: Daneil Dolin, MD;  [facility];  Service: Endoscopy;  Laterality: N/A;   PARTIAL KNEE ARTHROPLASTY Right 09/15/2014   Procedure: RIGHT UNICOMPARTMENTAL KNEE ARTHROPLASTY;  Surgeon: Leandrew Koyanagi, MD;  Location: [facility];  Service: Orthopedics;  Laterality: Right;   TOTAL KNEE ARTHROPLASTY Left 04/08/2004     OB History    Gravida  6   Para  5   Term  5   Preterm      AB  1   Living  5     SAB  1   TAB      Ectopic      Multiple      Live Births               Home Medications    Prior to Admission medications   Medication Sig Start Date End Date Taking? Authorizing Provider  ciprofloxacin (CILOXAN) 0.3 % ophthalmic solution Administer 1 drop, every 2 hours, while awake, for 2 days. Then 1 drop, every 4 hours, while  awake, for the next 5 days. 01/11/19   Danford, Valetta Fuller D, NP  Ciprofloxacin HCl 0.2 % otic solution Place 0.2 mLs into the left ear 2 (two) times daily. 01/11/19   Danford, Valetta Fuller D, NP  diclofenac sodium (VOLTAREN) 1 % GEL Apply 2 g topically 4 (four) times daily as needed. Patient taking differently: Apply 2 g topically 4 (four) times daily as needed (pain).  12/23/17   Aundra Dubin, PA-C  DULoxetine (CYMBALTA) 60 MG capsule Take 1 capsule (60 mg total) by mouth daily. 01/11/19   Danford, Valetta Fuller D, NP  hydrochlorothiazide (HYDRODIURIL) 25 MG tablet Take 1 tablet (25 mg total) by mouth daily. 01/11/19   Danford, Valetta Fuller D, NP  ibuprofen (ADVIL,MOTRIN) 600 MG tablet Take 600 mg by mouth daily as needed for moderate pain.    [provider]  ketoconazole (NIZORAL) 2 % shampoo Apply 1 application topically 2 (two) times a week. Patient  taking differently: Apply 1 application topically daily as needed for irritation.  11/16/17   Danford, Valetta Fuller D, NP  lisinopril (ZESTRIL) 40 MG tablet Take 1 tablet (40 mg total) by mouth daily. 01/11/19   Danford, Valetta Fuller D, NP  omeprazole (PRILOSEC) 20 MG capsule TAKE 1 CAPSULE BY MOUTH TWICE DAILY BEFORE MEAL 08/04/18   Danford, Valetta Fuller D, NP  sulfamethoxazole-trimethoprim (BACTRIM DS) 800-160 MG tablet Take 1 tablet by mouth 2 (two) times daily for 7 days. 01/16/19 01/23/19  Deliah Boston, PA-C    Family History Family History  Problem Relation Age of Onset   Cancer Father        lung   Cancer Sister        breast   Breast cancer Sister 53   Cancer Maternal Grandfather        lung   Cancer Paternal Grandfather        lung   Diabetes Son    Colon cancer Neg Hx     Social History Social History   Tobacco Use   Smoking status: Never Smoker   Smokeless tobacco: Never Used  Substance Use Topics   Alcohol use: No    Alcohol/week: 0.0 standard drinks   Drug use: No     Allergies   Tramadol   Review of Systems Review of Systems Ten systems are  reviewed and are negative for acute change except as noted in the HPI  Physical Exam Updated Vital Signs BP 131/84 (BP Location: Left Arm)    Pulse 94    Temp 98.2 F (36.8 C) (Oral)    Resp 16    LMP 02/07/2013    SpO2 98%   Physical Exam Constitutional:      General: She is not in acute distress.    Appearance: Normal appearance. She is well-developed. She is not ill-appearing or diaphoretic.  HENT:     Head: Normocephalic and atraumatic.     Right Ear: External ear normal.     Left Ear: External ear normal.     Nose: Nose normal.  Eyes:     General: Vision grossly intact. Gaze aligned appropriately.     Pupils: Pupils are equal, round, and reactive to light.  Neck:     Musculoskeletal: Normal range of motion.     Trachea: Trachea and phonation normal. No tracheal deviation.  Pulmonary:     Effort: Pulmonary effort is normal. No respiratory distress.  Abdominal:     General: There is no distension.     Palpations: Abdomen is soft.     Tenderness: There is no abdominal tenderness. There is no guarding or rebound.  Musculoskeletal: Normal range of motion.  Skin:    General: Skin is warm and dry.          Comments: Initial examination chaperoned by [facility] RN.  Patient with 5 cm area of induration of the left upper thigh without tracking towards the rectum.  No fluctuance.  Minimal surrounding erythema.  No drainage.  Subsequent examination chaperoned by Karen Chafe - Additionally patient with a large abrasion of the left dorsal forearm approximately 8 cm in length and 4 cm in width appears to be healing well without evidence of infection.  Neurological:     Mental Status: She is alert.     GCS: GCS eye subscore is 4. GCS verbal subscore is 5. GCS motor subscore is 6.     Comments: Speech is clear and goal oriented, follows commands Major Cranial nerves without  deficit, no facial droop Moves extremities without ataxia, coordination intact  Psychiatric:        Behavior:  Behavior normal.      ED Treatments / Results  Labs (all labs ordered are listed, but only abnormal results are displayed) Labs Reviewed - No data to display  EKG None  Radiology No results found.  Procedures Ultrasound ED Soft Tissue  Date/Time: 01/17/2019 8:59 AM Performed by: Deliah Boston, PA-C Authorized by: Deliah Boston, PA-C   Procedure details:    Indications: localization of abscess and evaluate for cellulitis     Transverse view:  Visualized   Longitudinal view:  Visualized   Images: archived   Location:    Location: lower extremity     Side:  Left Findings:     no abscess present    cellulitis present    no foreign body present Comments:     Ultrasound examination chaperoned by Karen Chafe.   (including critical care time)  Medications Ordered in ED Medications  sulfamethoxazole-trimethoprim (BACTRIM DS) 800-160 MG per tablet 1 tablet (has no administration in time range)     Initial Impression / Assessment and Plan / ED Course  I have reviewed the triage vital signs and the nursing notes.  Pertinent labs & imaging results that were available during my care of the patient were reviewed by me and considered in my medical decision making (see chart for details).    58 year old female presents today for possible abscess of the left upper thigh.  Ultrasound examination today shows cobblestoning and consistent with cellulitis however no clear abscess was identified.  She has no fluctuance as well.  Do not feel that incision and drainage would be beneficial for this patient at this time.  Will treat patient with Bactrim and encouraged follow-up for recheck in 2-3 days, and sooner if any new or worsening symptoms.  Patient denies any allergy to sulfa drugs or history of CKD.  Patient states understanding that abscess may form despite treatment and further treatment may be necessary.  Additionally patient with healing abrasion of the left upper arm, she  reports that her tetanus shot is up-to-date within the last 5 years.  This appears to be well-healing and is without sign infection I discussed proper wound care with the patient and she states understanding.  Patient is neurovascular intact to all 4 extremities.  She has no signs of systemic infection necessitating further evaluation or treatment here in the ED.  Of note patient reports that she is 5 years postmenopausal and does not want pregnancy test today, she states understanding that antibiotics given today would be teratogenic, she is accepts risks of taking antibiotics without first obtaining pregnancy test today.  At this time there does not appear to be any evidence of an acute emergency medical condition and the patient appears stable for discharge with appropriate outpatient follow up. Diagnosis was discussed with patient who verbalizes understanding of care plan and is agreeable to discharge. I have discussed return precautions with patient who verbalizes understanding of return precautions. Patient encouraged to follow-up with their PCP. All questions answered.   Note: Portions of this report may have been transcribed using voice recognition software. Every effort was made to ensure accuracy; however, inadvertent computerized transcription errors may still be present. Final Clinical Impressions(s) / ED Diagnoses   Final diagnoses:  Cellulitis of left lower extremity  Abrasion    ED Discharge Orders         Ordered  sulfamethoxazole-trimethoprim (BACTRIM DS) 800-160 MG tablet  2 times daily     01/16/19 1602           Deliah Boston, PA-C 01/16/19 1603    Gari Crown 01/17/19 0900    Carmin Muskrat, MD 01/17/19 1425

## 2019-01-16 NOTE — Discharge Instructions (Signed)
You have been diagnosed today with cellulitis  At this time there does not appear to be the presence of an emergent medical condition, however there is always the potential for conditions to change. Please read and follow the below instructions.  Please return to the Emergency Department immediately for any new or worsening symptoms. Please be sure to follow up with your Primary Care Provider within one week regarding your visit today; please call their office to schedule an appointment even if you are feeling better for a follow-up visit. Please take your antibiotic Bactrim as prescribed for treatment of your skin infection today.  Please use warm soaks on your left upper thigh to help with your symptoms.  Please follow-up with your primary care doctor within 2-3 days for recheck of this area.  You may also follow-up with an urgent care or here the emergency department for wound check if you are able to be seen by your primary care doctor. Please use proper wound care on your left forearm to ensure healing of this area, rinse gently with soap and water twice daily and change your bandage to a clean gauze twice daily.  Get help right away if: Your symptoms get worse. You feel very sleepy. You have fever or chills You throw up (vomit) or have watery poop (diarrhea) for a long time. You see red streaks coming from the area. Your red area gets larger. Your red area turns dark in color. You have any new/concerning or worsening symptoms  Please read the additional information packets attached to your discharge summary.  Do not take your medicine if  develop an itchy rash, swelling in your mouth or lips, or difficulty breathing; call 911 and seek immediate emergency medical attention if this occurs.

## 2019-01-19 ENCOUNTER — Other Ambulatory Visit: Payer: Self-pay

## 2019-01-19 ENCOUNTER — Encounter (HOSPITAL_COMMUNITY): Payer: Self-pay

## 2019-01-19 ENCOUNTER — Emergency Department (HOSPITAL_COMMUNITY)
Admission: EM | Admit: 2019-01-19 | Discharge: 2019-01-19 | Disposition: A | Discharge status: Home / Self Care | Payer: Self-pay | Attending: Emergency Medicine | Admitting: Emergency Medicine

## 2019-01-19 DIAGNOSIS — I10 Essential (primary) hypertension: Secondary | ICD-10-CM | POA: Insufficient documentation

## 2019-01-19 DIAGNOSIS — Z79899 Other long term (current) drug therapy: Secondary | ICD-10-CM | POA: Insufficient documentation

## 2019-01-19 DIAGNOSIS — N179 Acute kidney failure, unspecified: Secondary | ICD-10-CM | POA: Insufficient documentation

## 2019-01-19 DIAGNOSIS — L02416 Cutaneous abscess of left lower limb: Secondary | ICD-10-CM | POA: Insufficient documentation

## 2019-01-19 DIAGNOSIS — L0291 Cutaneous abscess, unspecified: Secondary | ICD-10-CM

## 2019-01-19 DIAGNOSIS — F419 Anxiety disorder, unspecified: Secondary | ICD-10-CM | POA: Insufficient documentation

## 2019-01-19 LAB — COMPREHENSIVE METABOLIC PANEL
ALT: 15 U/L (ref 0–44)
AST: 17 U/L (ref 15–41)
Albumin: 3.4 g/dL — ABNORMAL LOW (ref 3.5–5.0)
Alkaline Phosphatase: 80 U/L (ref 38–126)
Anion gap: 11 (ref 5–15)
BUN: 27 mg/dL — ABNORMAL HIGH (ref 6–20)
CO2: 25 mmol/L (ref 22–32)
Calcium: 9.2 mg/dL (ref 8.9–10.3)
Chloride: 100 mmol/L (ref 98–111)
Creatinine, Ser: 1.95 mg/dL — ABNORMAL HIGH (ref 0.44–1.00)
GFR calc Af Amer: 33 mL/min — ABNORMAL LOW (ref >60)
GFR calc non Af Amer: 28 mL/min — ABNORMAL LOW (ref >60)
Glucose, Bld: 119 mg/dL — ABNORMAL HIGH (ref 70–99)
Potassium: 3.7 mmol/L (ref 3.5–5.1)
Sodium: 136 mmol/L (ref 135–145)
Total Bilirubin: 0.4 mg/dL (ref 0.3–1.2)
Total Protein: 7 g/dL (ref 6.5–8.1)

## 2019-01-19 LAB — CBC
HCT: 32.4 % — ABNORMAL LOW (ref 36.0–46.0)
Hemoglobin: 10.2 g/dL — ABNORMAL LOW (ref 12.0–15.0)
MCH: 27.3 pg (ref 26.0–34.0)
MCHC: 31.5 g/dL (ref 30.0–36.0)
MCV: 86.6 fL (ref 80.0–100.0)
Platelets: 463 10*3/uL — ABNORMAL HIGH (ref 150–400)
RBC: 3.74 MIL/uL — ABNORMAL LOW (ref 3.87–5.11)
RDW: 16.7 % — ABNORMAL HIGH (ref 11.5–15.5)
WBC: 13.4 10*3/uL — ABNORMAL HIGH (ref 4.0–10.5)
nRBC: 0 % (ref 0.0–0.2)

## 2019-01-19 LAB — LACTIC ACID, PLASMA: Lactic Acid, Venous: 1 mmol/L (ref 0.5–1.9)

## 2019-01-19 LAB — URINALYSIS, ROUTINE W REFLEX MICROSCOPIC
Bilirubin Urine: NEGATIVE
Glucose, UA: NEGATIVE mg/dL
Hgb urine dipstick: NEGATIVE
Ketones, ur: 5 mg/dL — AB
Leukocytes,Ua: NEGATIVE
Nitrite: NEGATIVE
Protein, ur: NEGATIVE mg/dL
Specific Gravity, Urine: 1.021 (ref 1.005–1.030)
pH: 5 (ref 5.0–8.0)

## 2019-01-19 MED ORDER — VANCOMYCIN HCL IN DEXTROSE 1-5 GM/200ML-% IV SOLN
1000.0000 mg | Freq: Once | INTRAVENOUS | Status: AC
  Administered 2019-01-19: 22:00:00 1000 mg via INTRAVENOUS
  Filled 2019-01-19: qty 200

## 2019-01-19 MED ORDER — LIDOCAINE HCL 2 % IJ SOLN
20.0000 mL | Freq: Once | INTRAMUSCULAR | Status: DC
  Filled 2019-01-19: qty 20

## 2019-01-19 MED ORDER — PIPERACILLIN-TAZOBACTAM 3.375 G IVPB 30 MIN
3.3750 g | Freq: Once | INTRAVENOUS | Status: AC
  Administered 2019-01-19: 22:00:00 3.375 g via INTRAVENOUS
  Filled 2019-01-19: qty 50

## 2019-01-19 MED ORDER — LORAZEPAM 2 MG/ML IJ SOLN
1.0000 mg | Freq: Once | INTRAMUSCULAR | Status: AC
  Administered 2019-01-19: 1 mg via INTRAVENOUS
  Filled 2019-01-19: qty 1

## 2019-01-19 MED ORDER — HYDROMORPHONE HCL 1 MG/ML IJ SOLN
1.0000 mg | Freq: Once | INTRAMUSCULAR | Status: AC
  Administered 2019-01-19: 22:00:00 1 mg via INTRAVENOUS
  Filled 2019-01-19: qty 1

## 2019-01-19 MED ORDER — SODIUM CHLORIDE 0.9 % IV BOLUS
1000.0000 mL | Freq: Once | INTRAVENOUS | Status: AC
  Administered 2019-01-19: 22:00:00 1000 mL via INTRAVENOUS

## 2019-01-19 MED ORDER — CLINDAMYCIN HCL 300 MG PO CAPS: 300.0000 mg | ORAL_CAPSULE | Freq: Four times a day (QID) | ORAL | 0 refills | Status: DC

## 2019-01-19 MED ORDER — HYDROCODONE-ACETAMINOPHEN 5-325 MG PO TABS: 1.0000 | ORAL_TABLET | Freq: Four times a day (QID) | ORAL | 0 refills | Status: DC | PRN

## 2019-01-19 NOTE — ED Triage Notes (Signed)
Pt arrives POV for eval of L posterior thigh abscess/boil. Pt reports poss spider bite? Seen here for same 9/6 started on abx at that time, now back because it is worse w/ streaking and spreading up thigh. No drainage

## 2019-01-19 NOTE — ED Provider Notes (Signed)
[facility] EMERGENCY DEPARTMENT Provider Note   CSN: [id number] Arrival date & time: 01/19/19  1235     History   Chief Complaint Chief Complaint  Patient presents with  . Cellulitis    HPI [name] is a 58 y.o. female history of anxiety, hyperlipidemia, hypertension here presenting with worsening cellulitis.  Patient was seen in the ED 2 days ago for left thigh cellulitis.  Bedside ultrasound was performed at that time and there was no fluid collection.  Patient was then given Bactrim.  She states that over the last 2 days, her left posterior thigh area became more swollen and red and there is some drainage there.  She had some chills but no fevers.      The history is provided by the patient.    Past Medical History:  Diagnosis Date  . Acid reflux    takes Zantac and Omeprazole daily  . Anxiety    takes Citaopram daily  . Arthritis    right knee  . Chronic back pain    DDD  . Clotting disorder (HCC)    hx of blood clot following knee scope  . History of blood clots    33yrs ago and in left leg  . History of bronchitis 3+yrs ago  . Hyperlipidemia    takes Atorvastatin daily  . Hypertension    takes Lisinopril and HCTZ daily  . Insomnia    takes Elavil nightly as needed  . Joint pain   . Joint swelling   . Paraesophageal hernia   . Weakness    numbness and tingling in both feet    Patient Active Problem List   Diagnosis Date Noted  . Restless legs syndrome (RLS) 06/30/2018  . Arthrosis of first carpometacarpal joint 02/11/2018  . Primary osteoarthritis of first carpometacarpal joint of left hand   . Pain in left hand 12/23/2017  . Healthcare maintenance 11/16/2017  . Tinea versicolor 11/16/2017  . Chronic right shoulder pain 09/02/2017  . Fibromyalgia 08/11/2017  . Family history of diabetes mellitus 06/23/2016  . Other fatigue 06/23/2016  . Insomnia 06/23/2016  . Rheumatoid arthritis ([place]) 06/23/2016  . Nontraumatic incomplete  tear of right rotator cuff 04/28/2016  . Impingement syndrome of right shoulder 03/31/2016  . Status post right partial knee replacement 09/15/2014  . Acute medial meniscal injury of knee 06/14/2014  . Status post total knee replacement using cement 06/08/2014  . Primary osteoarthritis of right knee 06/08/2014  . Screening for colon cancer   . Schatzki's ring   . Hiatal hernia   . Dysphagia, pharyngoesophageal phase 05/26/2014  . Encounter for screening colonoscopy 05/26/2014  . Adrenal adenoma 05/01/2014  . Breast lump 05/01/2014  . Metatarsalgia of both feet 01/12/2014  . Bilateral leg pain 01/11/2014  . Bilateral edema of lower extremity 01/11/2014  . Other osteoarthritis of spine, thoracolumbar region 12/26/2013  . Hypercholesteremia 05/19/2013  . Periodic health assessment, general screening, adult 04/13/2013  . GERD (gastroesophageal reflux disease) 04/13/2013  . HTN (hypertension) 04/13/2013  . Dyspnea 04/13/2013    Past Surgical History:  Procedure Laterality Date  . BREAST BIOPSY Left   . BUNIONECTOMY Right 02/18/2018   Procedure: Yehuda Budd;  Surgeon: Edrick Kins, DPM;  Location: [facility];  Service: Podiatry;  Laterality: Right;  . CAPSULOTOMY Bilateral 02/18/2018   Procedure: CAPSULOTOMY MPJ RELEASE JOINT 2N BILATERAL;  Surgeon: Edrick Kins, DPM;  Location: [facility];  Service: Podiatry;  Laterality: Bilateral;  . CARPOMETACARPEL SUSPENSION  PLASTY Left 01/27/2018   Procedure: LEFT THUMB ligament reconstruction and tendon interposition;  Surgeon: Leandrew Koyanagi, MD;  Location: [facility];  Service: Orthopedics;  Laterality: Left;  . CHONDROPLASTY Right 06/28/2014   Procedure: CHONDROPLASTY;  Surgeon: Marianna Payment, MD;  Location: [facility];  Service: Orthopedics;  Laterality: Right;  . COLONOSCOPY N/A 05/29/2014   Procedure: COLONOSCOPY;  Surgeon: Daneil Dolin, MD;  [facility];  Service: Endoscopy;  Laterality: N/A;   215pm- Pt is working until 12:00 so she can't come any earlier  . ESOPHAGOGASTRODUODENOSCOPY N/A 05/29/2014   Procedure: ESOPHAGOGASTRODUODENOSCOPY (EGD);  Surgeon: Daneil Dolin, MD;  [facility];  Service: Endoscopy;  Laterality: N/A;  . GANGLION CYST EXCISION Left 01/13/2002  . HAMMER TOE SURGERY Bilateral 02/18/2018   Procedure: HAMMER TOE CORRECTION2ND BILATERAL;  Surgeon: Edrick Kins, DPM;  Location: [facility];  Service: Podiatry;  Laterality: Bilateral;  . KNEE ARTHROSCOPY WITH MEDIAL MENISECTOMY Right 06/28/2014   Procedure: RIGHT KNEE ARTHROSCOPY WITH PARTIAL MEDIAL MENISCECTOMY AND CHONDROPLASTY;  Surgeon: Marianna Payment, MD;  Location: [facility];  Service: Orthopedics;  Laterality: Right;  . MALONEY DILATION N/A 05/29/2014   Procedure: Venia Minks DILATION;  Surgeon: Daneil Dolin, MD;  [facility];  Service: Endoscopy;  Laterality: N/A;  . PARTIAL KNEE ARTHROPLASTY Right 09/15/2014   Procedure: RIGHT UNICOMPARTMENTAL KNEE ARTHROPLASTY;  Surgeon: Leandrew Koyanagi, MD;  Location: [facility];  Service: Orthopedics;  Laterality: Right;  . TOTAL KNEE ARTHROPLASTY Left 04/08/2004     OB History    Gravida  6   Para  5   Term  5   Preterm      AB  1   Living  5     SAB  1   TAB      Ectopic      Multiple      Live Births               Home Medications    Prior to Admission medications   Medication Sig Start Date End Date Taking? Authorizing Provider  ciprofloxacin (CILOXAN) 0.3 % ophthalmic solution Administer 1 drop, every 2 hours, while awake, for 2 days. Then 1 drop, every 4 hours, while awake, for the next 5 days. 01/11/19   Danford, Valetta Fuller D, NP  Ciprofloxacin HCl 0.2 % otic solution Place 0.2 mLs into the left ear 2 (two) times daily. 01/11/19   Danford, Valetta Fuller D, NP  diclofenac sodium (VOLTAREN) 1 % GEL Apply 2 g topically 4 (four) times daily as needed. Patient taking differently: Apply 2 g topically 4 (four) times daily as needed  (pain).  12/23/17   Aundra Dubin, PA-C  DULoxetine (CYMBALTA) 60 MG capsule Take 1 capsule (60 mg total) by mouth daily. 01/11/19   Danford, Valetta Fuller D, NP  hydrochlorothiazide (HYDRODIURIL) 25 MG tablet Take 1 tablet (25 mg total) by mouth daily. 01/11/19   Danford, Valetta Fuller D, NP  ibuprofen (ADVIL,MOTRIN) 600 MG tablet Take 600 mg by mouth daily as needed for moderate pain.    [provider]  ketoconazole (NIZORAL) 2 % shampoo Apply 1 application topically 2 (two) times a week. Patient taking differently: Apply 1 application topically daily as needed for irritation.  11/16/17   Danford, Valetta Fuller D, NP  lisinopril (ZESTRIL) 40 MG tablet Take 1 tablet (40 mg total) by mouth daily. 01/11/19   Danford, Valetta Fuller D, NP  omeprazole (PRILOSEC) 20 MG capsule  TAKE 1 CAPSULE BY MOUTH TWICE DAILY BEFORE MEAL 08/04/18   Danford, Valetta Fuller D, NP  sulfamethoxazole-trimethoprim (BACTRIM DS) 800-160 MG tablet Take 1 tablet by mouth 2 (two) times daily for 7 days. 01/16/19 01/23/19  Deliah Boston, PA-C    Family History Family History  Problem Relation Age of Onset  . Cancer Father        lung  . Cancer Sister        breast  . Breast cancer Sister 79  . Cancer Maternal Grandfather        lung  . Cancer Paternal Grandfather        lung  . Diabetes Son   . Colon cancer Neg Hx     Social History Social History   Tobacco Use  . Smoking status: Never Smoker  . Smokeless tobacco: Never Used  Substance Use Topics  . Alcohol use: No    Alcohol/week: 0.0 standard drinks  . Drug use: No     Allergies   Tramadol   Review of Systems Review of Systems  Skin: Positive for wound.  All other systems reviewed and are negative.    Physical Exam Updated Vital Signs BP (!) 108/53 (BP Location: Right Arm)   Pulse 66   Temp 98.4 F (36.9 C) (Oral)   Resp 16   Ht 5' 3.5" (1.613 m)   Wt 90.7 kg   LMP 02/07/2013   SpO2 98%   BMI 34.87 kg/m   Physical Exam Vitals signs and nursing note reviewed.  HENT:      Head: Normocephalic.     Nose: Nose normal.     Mouth/Throat:     Mouth: Mucous membranes are moist.  Eyes:     Extraocular Movements: Extraocular movements intact.     Pupils: Pupils are equal, round, and reactive to light.  Neck:     Musculoskeletal: Normal range of motion.  Cardiovascular:     Rate and Rhythm: Normal rate and regular rhythm.     Pulses: Normal pulses.  Pulmonary:     Effort: Pulmonary effort is normal.  Abdominal:     General: Abdomen is flat.     Palpations: Abdomen is soft.  Genitourinary:    Comments: L posterior thigh with area of fluctuance with some discharge. Very tender and red  Musculoskeletal: Normal range of motion.  Skin:    General: Skin is warm.     Capillary Refill: Capillary refill takes less than 2 seconds.  Neurological:     General: No focal deficit present.     Mental Status: She is alert.  Psychiatric:        Mood and Affect: Mood normal.        Behavior: Behavior normal.      ED Treatments / Results  Labs (all labs ordered are listed, but only abnormal results are displayed) Labs Reviewed  CBC - Abnormal; Notable for the following components:      Result Value   WBC 13.4 (*)    RBC 3.74 (*)    Hemoglobin 10.2 (*)    HCT 32.4 (*)    RDW 16.7 (*)    Platelets 463 (*)    All other components within normal limits  COMPREHENSIVE METABOLIC PANEL - Abnormal; Notable for the following components:   Glucose, Bld 119 (*)    BUN 27 (*)    Creatinine, Ser 1.95 (*)    Albumin 3.4 (*)    GFR calc non Af Amer 28 (*)  GFR calc Af Amer 33 (*)    All other components within normal limits  CULTURE, BLOOD (ROUTINE X 2)  CULTURE, BLOOD (ROUTINE X 2)  AEROBIC CULTURE (SUPERFICIAL SPECIMEN)  LACTIC ACID, PLASMA    EKG None  Radiology No results found.  Procedures Procedures (including critical care time)  INCISION AND DRAINAGE Performed by: Wandra Arthurs Consent: Verbal consent obtained. Risks and benefits: risks, benefits  and alternatives were discussed Type: abscess  Body area: L posterior thigh  Anesthesia: local infiltration  Incision was made with a scalpel.  Local anesthetic: lidocaine 2 % no epinephrine  Anesthetic total: 10 ml  Complexity: complex Blunt dissection to break up loculations  Drainage: purulent  Drainage amount: copious   Packing material: 1/4 in iodoform gauze  Patient tolerance: Patient tolerated the procedure well with no immediate complications.  EMERGENCY DEPARTMENT US SOFT TISSUE INTERPRETATION "Study: Limited Soft Tissue Ultrasound"  INDICATIONS: Soft tissue infection Multiple views of the body part were obtained in real-time with a multi-frequency linear probe  PERFORMED BY: Myself IMAGES ARCHIVED?: Yes SIDE:Left BODY PART:Lower extremity INTERPRETATION:  Abcess present      Medications Ordered in ED Medications  sodium chloride 0.9 % bolus 1,000 mL (has no administration in time range)  HYDROmorphone (DILAUDID) injection 1 mg (has no administration in time range)  LORazepam (ATIVAN) injection 1 mg (has no administration in time range)  vancomycin (VANCOCIN) IVPB 1000 mg/200 mL premix (has no administration in time range)  piperacillin-tazobactam (ZOSYN) IVPB 3.375 g (has no administration in time range)  lidocaine (XYLOCAINE) 2 % (with pres) injection 400 mg (has no administration in time range)     Initial Impression / Assessment and Plan / ED Course  I have reviewed the triage vital signs and the nursing notes.  Pertinent labs & imaging results that were available during my care of the patient were reviewed by me and considered in my medical decision making (see chart for details).       [name] is a 58 y.o. female here with L thigh abscess. She was on bactrim but now has more drainage and fluctuance. Bedside US confirmed an abscess. Will get labs and perform I and D. Will give IV abx as well.   11:02 PM Patient's WBC is 13. Lactate nl.  She also has AKI with Cr up to 2. Unclear if its from the bactrim or she is dehydrated. Given IVF and vanc/zosyn. I performed I and D and there is copious drainage. Packing placed. Wound culture sent. Will stop bactrim and switch to clindamycin. Will have her return in 2 days for wound check and packing removal. She will need repeat BMP as well to check her kidney function.    Final Clinical Impressions(s) / ED Diagnoses   Final diagnoses:  None    ED Discharge Orders    None       Drenda Freeze, MD 01/19/19 2303

## 2019-01-19 NOTE — Discharge Instructions (Addendum)
Stop taking bactrim  Stay hydrated   Take clindamycin instead   Return in 2 days for wound check. You will need packing removed and also your kidney function rechecked at that time. Your wound culture results should be available then   Change dressing when it gets soaked   Return to ER earlier if you have fever, severe pain, worse redness

## 2019-01-19 NOTE — ED Notes (Signed)
Pt is aware she needs a urine sample 

## 2019-01-21 ENCOUNTER — Emergency Department (HOSPITAL_COMMUNITY)
Admission: EM | Admit: 2019-01-21 | Discharge: 2019-01-21 | Disposition: A | Discharge status: Home / Self Care | Payer: Self-pay | Attending: Emergency Medicine | Admitting: Emergency Medicine

## 2019-01-21 ENCOUNTER — Other Ambulatory Visit: Payer: Self-pay

## 2019-01-21 ENCOUNTER — Encounter (HOSPITAL_COMMUNITY): Payer: Self-pay | Admitting: Emergency Medicine

## 2019-01-21 DIAGNOSIS — L02416 Cutaneous abscess of left lower limb: Secondary | ICD-10-CM | POA: Insufficient documentation

## 2019-01-21 DIAGNOSIS — Z79899 Other long term (current) drug therapy: Secondary | ICD-10-CM | POA: Insufficient documentation

## 2019-01-21 DIAGNOSIS — Z5189 Encounter for other specified aftercare: Secondary | ICD-10-CM

## 2019-01-21 DIAGNOSIS — I10 Essential (primary) hypertension: Secondary | ICD-10-CM | POA: Insufficient documentation

## 2019-01-21 LAB — BASIC METABOLIC PANEL
Anion gap: 11 (ref 5–15)
BUN: 23 mg/dL — ABNORMAL HIGH (ref 6–20)
CO2: 24 mmol/L (ref 22–32)
Calcium: 9.1 mg/dL (ref 8.9–10.3)
Chloride: 102 mmol/L (ref 98–111)
Creatinine, Ser: 1.28 mg/dL — ABNORMAL HIGH (ref 0.44–1.00)
GFR calc Af Amer: 54 mL/min — ABNORMAL LOW (ref >60)
GFR calc non Af Amer: 47 mL/min — ABNORMAL LOW (ref >60)
Glucose, Bld: 107 mg/dL — ABNORMAL HIGH (ref 70–99)
Potassium: 3.4 mmol/L — ABNORMAL LOW (ref 3.5–5.1)
Sodium: 137 mmol/L (ref 135–145)

## 2019-01-21 NOTE — ED Provider Notes (Signed)
[facility] EMERGENCY DEPARTMENT Provider Note   CSN: [id number] Arrival date & time: 01/21/19  1416     History   Chief Complaint Chief Complaint  Patient presents with  . Wound Check    HPI [name] is a 58 y.o. female who presents to the ED for wound check, packing removal and f/u labs. Patient reports this is her 3rd visit to the ED after getting the abscess to her left thigh. Initially she was started on bactrim and the area worsened. She returned 2 days ago and had I&D of the area. At that time she had elevated BUN and Creatinine. She was told to stop the Bactrim and was started on Clindamycin.      HPI  Past Medical History:  Diagnosis Date  . Acid reflux    takes Zantac and Omeprazole daily  . Anxiety    takes Citaopram daily  . Arthritis    right knee  . Chronic back pain    DDD  . Clotting disorder (HCC)    hx of blood clot following knee scope  . History of blood clots    29yrs ago and in left leg  . History of bronchitis 3+yrs ago  . Hyperlipidemia    takes Atorvastatin daily  . Hypertension    takes Lisinopril and HCTZ daily  . Insomnia    takes Elavil nightly as needed  . Joint pain   . Joint swelling   . Paraesophageal hernia   . Weakness    numbness and tingling in both feet    Patient Active Problem List   Diagnosis Date Noted  . Restless legs syndrome (RLS) 06/30/2018  . Arthrosis of first carpometacarpal joint 02/11/2018  . Primary osteoarthritis of first carpometacarpal joint of left hand   . Pain in left hand 12/23/2017  . Healthcare maintenance 11/16/2017  . Tinea versicolor 11/16/2017  . Chronic right shoulder pain 09/02/2017  . Fibromyalgia 08/11/2017  . Family history of diabetes mellitus 06/23/2016  . Other fatigue 06/23/2016  . Insomnia 06/23/2016  . Rheumatoid arthritis ([place]) 06/23/2016  . Nontraumatic incomplete tear of right rotator cuff 04/28/2016  . Impingement syndrome of right shoulder 03/31/2016   . Status post right partial knee replacement 09/15/2014  . Acute medial meniscal injury of knee 06/14/2014  . Status post total knee replacement using cement 06/08/2014  . Primary osteoarthritis of right knee 06/08/2014  . Screening for colon cancer   . Schatzki's ring   . Hiatal hernia   . Dysphagia, pharyngoesophageal phase 05/26/2014  . Encounter for screening colonoscopy 05/26/2014  . Adrenal adenoma 05/01/2014  . Breast lump 05/01/2014  . Metatarsalgia of both feet 01/12/2014  . Bilateral leg pain 01/11/2014  . Bilateral edema of lower extremity 01/11/2014  . Other osteoarthritis of spine, thoracolumbar region 12/26/2013  . Hypercholesteremia 05/19/2013  . Periodic health assessment, general screening, adult 04/13/2013  . GERD (gastroesophageal reflux disease) 04/13/2013  . HTN (hypertension) 04/13/2013  . Dyspnea 04/13/2013    Past Surgical History:  Procedure Laterality Date  . BREAST BIOPSY Left   . BUNIONECTOMY Right 02/18/2018   Procedure: Yehuda Budd;  Surgeon: Edrick Kins, DPM;  Location: [facility];  Service: Podiatry;  Laterality: Right;  . CAPSULOTOMY Bilateral 02/18/2018   Procedure: CAPSULOTOMY MPJ RELEASE JOINT 2N BILATERAL;  Surgeon: Edrick Kins, DPM;  Location: [facility];  Service: Podiatry;  Laterality: Bilateral;  . CARPOMETACARPEL SUSPENSION PLASTY Left 01/27/2018   Procedure: LEFT THUMB ligament reconstruction and  tendon interposition;  Surgeon: Leandrew Koyanagi, MD;  Location: [facility];  Service: Orthopedics;  Laterality: Left;  . CHONDROPLASTY Right 06/28/2014   Procedure: CHONDROPLASTY;  Surgeon: Marianna Payment, MD;  Location: [facility];  Service: Orthopedics;  Laterality: Right;  . COLONOSCOPY N/A 05/29/2014   Procedure: COLONOSCOPY;  Surgeon: Daneil Dolin, MD;  [facility];  Service: Endoscopy;  Laterality: N/A;  215pm- Pt is working until 12:00 so she can't come any earlier  .  ESOPHAGOGASTRODUODENOSCOPY N/A 05/29/2014   Procedure: ESOPHAGOGASTRODUODENOSCOPY (EGD);  Surgeon: Daneil Dolin, MD;  [facility];  Service: Endoscopy;  Laterality: N/A;  . GANGLION CYST EXCISION Left 01/13/2002  . HAMMER TOE SURGERY Bilateral 02/18/2018   Procedure: HAMMER TOE CORRECTION2ND BILATERAL;  Surgeon: Edrick Kins, DPM;  Location: [facility];  Service: Podiatry;  Laterality: Bilateral;  . KNEE ARTHROSCOPY WITH MEDIAL MENISECTOMY Right 06/28/2014   Procedure: RIGHT KNEE ARTHROSCOPY WITH PARTIAL MEDIAL MENISCECTOMY AND CHONDROPLASTY;  Surgeon: Marianna Payment, MD;  Location: [facility];  Service: Orthopedics;  Laterality: Right;  . MALONEY DILATION N/A 05/29/2014   Procedure: Venia Minks DILATION;  Surgeon: Daneil Dolin, MD;  [facility];  Service: Endoscopy;  Laterality: N/A;  . PARTIAL KNEE ARTHROPLASTY Right 09/15/2014   Procedure: RIGHT UNICOMPARTMENTAL KNEE ARTHROPLASTY;  Surgeon: Leandrew Koyanagi, MD;  Location: [facility];  Service: Orthopedics;  Laterality: Right;  . TOTAL KNEE ARTHROPLASTY Left 04/08/2004     OB History    Gravida  6   Para  5   Term  5   Preterm      AB  1   Living  5     SAB  1   TAB      Ectopic      Multiple      Live Births               Home Medications    Prior to Admission medications   Medication Sig Start Date End Date Taking? Authorizing Provider  ciprofloxacin (CILOXAN) 0.3 % ophthalmic solution Administer 1 drop, every 2 hours, while awake, for 2 days. Then 1 drop, every 4 hours, while awake, for the next 5 days. Patient not taking: Reported on 01/19/2019 01/11/19   Mina Marble D, NP  Ciprofloxacin HCl 0.2 % otic solution Place 0.2 mLs into the left ear 2 (two) times daily. Patient not taking: Reported on 01/19/2019 01/11/19   Mina Marble D, NP  clindamycin (CLEOCIN) 300 MG capsule Take 1 capsule (300 mg total) by mouth every 6 (six) hours. 01/19/19   Drenda Freeze, MD  diclofenac sodium  (VOLTAREN) 1 % GEL Apply 2 g topically 4 (four) times daily as needed. Patient not taking: Reported on 01/19/2019 12/23/17   Aundra Dubin, PA-C  DULoxetine (CYMBALTA) 60 MG capsule Take 1 capsule (60 mg total) by mouth daily. 01/11/19   Danford, Valetta Fuller D, NP  hydrochlorothiazide (HYDRODIURIL) 25 MG tablet Take 1 tablet (25 mg total) by mouth daily. 01/11/19   Danford, Valetta Fuller D, NP  HYDROcodone-acetaminophen (NORCO) 5-325 MG tablet Take 1 tablet by mouth every 6 (six) hours as needed. 01/19/19   Drenda Freeze, MD  ibuprofen (ADVIL) 200 MG tablet Take 800 mg by mouth every 6 (six) hours as needed for moderate pain.    [provider]  ketoconazole (NIZORAL) 2 % shampoo Apply 1 application topically 2 (two) times a week. Patient not taking: Reported  on 01/19/2019 11/16/17   Mina Marble D, NP  lisinopril (ZESTRIL) 40 MG tablet Take 1 tablet (40 mg total) by mouth daily. 01/11/19   Danford, Valetta Fuller D, NP  omeprazole (PRILOSEC) 20 MG capsule TAKE 1 CAPSULE BY MOUTH TWICE DAILY BEFORE MEAL Patient taking differently: Take 20 mg by mouth 2 (two) times daily before a meal.  08/04/18   Danford, Valetta Fuller D, NP  sulfamethoxazole-trimethoprim (BACTRIM DS) 800-160 MG tablet Take 1 tablet by mouth 2 (two) times daily for 7 days. 01/16/19 01/23/19  Deliah Boston, PA-C    Family History Family History  Problem Relation Age of Onset  . Cancer Father        lung  . Cancer Sister        breast  . Breast cancer Sister 46  . Cancer Maternal Grandfather        lung  . Cancer Paternal Grandfather        lung  . Diabetes Son   . Colon cancer Neg Hx     Social History Social History   Tobacco Use  . Smoking status: Never Smoker  . Smokeless tobacco: Never Used  Substance Use Topics  . Alcohol use: No    Alcohol/week: 0.0 standard drinks  . Drug use: No     Allergies   Tramadol   Review of Systems Review of Systems  Skin: Positive for wound.  All other systems reviewed and are negative.     Physical Exam Updated Vital Signs BP 119/73 (BP Location: Left Arm)   Pulse 82   Temp 98.1 F (36.7 C) (Oral)   Resp 14   Ht 5' 3.5" (1.613 m)   Wt 90.7 kg   LMP 02/07/2013   SpO2 99%   BMI 34.87 kg/m   Physical Exam Vitals signs and nursing note reviewed.  Constitutional:      Appearance: She is well-developed.  Neck:     Musculoskeletal: Neck supple.  Pulmonary:     Effort: Pulmonary effort is normal.  Musculoskeletal: Normal range of motion.  Skin:    Comments: Left thigh with draining abscess. Packing in place. No surrounding erythema.   Neurological:     Mental Status: She is alert.   Procedure: packing removed without difficulty, abscess continued to drain.    ED Treatments / Results  Labs (all labs ordered are listed, but only abnormal results are displayed) Labs Reviewed  BASIC METABOLIC PANEL - Abnormal; Notable for the following components:      Result Value   Potassium 3.4 (*)    Glucose, Bld 107 (*)    BUN 23 (*)    Creatinine, Ser 1.28 (*)    GFR calc non Af Amer 47 (*)    GFR calc Af Amer 54 (*)    All other components within normal limits   Radiology No results found.  Procedures Procedures (including critical care time)  Medications Ordered in ED Medications - No data to display   Initial Impression / Assessment and Plan / ED Course  I have reviewed the triage vital signs and the nursing notes. 58 y.o. female with abscess to the left thigh stable for d/c with improved labs and draining abscess. Patient to continue medications and apply warm wet compresses to the area several times a day. Return precautions.  Pertinent lab results that were available during my care of the patient were reviewed by me and considered in my medical decision making (see chart for details).   Final Clinical  Impressions(s) / ED Diagnoses   Final diagnoses:  Wound check, abscess    ED Discharge Orders    None       Debroah Baller [place], [place] 01/21/19 1748     Virgel Manifold, MD 01/23/19 [phone]

## 2019-01-21 NOTE — Discharge Instructions (Signed)
Apply warm wet compresses or sit in a warm tub of water several times a day. Continue the medications prescribed for you. Return if symptoms worsen.

## 2019-01-21 NOTE — ED Triage Notes (Signed)
Pt returns to ED for recheck of abcess that was packed on Wednesday. Pt taking antibiotics. Area still sore per patient. Unable to assess wound due to dressing intact. VSS.

## 2019-01-22 LAB — AEROBIC CULTURE W GRAM STAIN (SUPERFICIAL SPECIMEN): Gram Stain: NONE SEEN

## 2019-01-23 ENCOUNTER — Telehealth: Payer: Self-pay | Admitting: Emergency Medicine

## 2019-01-23 NOTE — Telephone Encounter (Signed)
Post ED Visit - Positive Culture Follow-up: Successful Patient Follow-Up  Culture assessed and recommendations reviewed by:  []  Elenor Quinones, Pharm.D. []  Heide Guile, Pharm.D., BCPS AQ-ID []  Parks Neptune, Pharm.D., BCPS []  Alycia Rossetti, Pharm.D., BCPS []  [place], Pharm.D., BCPS, AAHIVP []  Legrand Como, Pharm.D., BCPS, AAHIVP []  Salome Arnt, PharmD, BCPS []  Johnnette Gourd, PharmD, BCPS []  Hughes Better, PharmD, BCPS []  Leeroy Cha, PharmD Gorden Harms PharmD  Positive wound culture  []  Patient discharged without antimicrobial prescription and treatment is now indicated [x]  Organism is resistant to prescribed ED discharge antimicrobial []  Patient with positive blood cultures  Changes discussed with ED provider: Martinique Robinson PA New antibiotic prescription stop clindamycin, change to doxycycline 100mg  po twice daily x 10 days  Attempting to contact patient     Hazle Nordmann 01/23/2019, 1:12 PM

## 2019-01-23 NOTE — Progress Notes (Signed)
ED Antimicrobial Stewardship Positive Culture Follow Up   [name] is an 58 y.o. female who presented to [facility] on 01/21/2019 with a chief complaint of  Chief Complaint  Patient presents with  . Wound Check    Recent Results (from the past 720 hour(s))  Blood culture (routine x 2)     Status: None (Preliminary result)   Collection Time: 01/19/19  8:30 PM   Specimen: BLOOD  Result Value Ref Range Status   Specimen Description BLOOD RIGHT ARM  Final   Special Requests   Final    BOTTLES DRAWN AEROBIC AND ANAEROBIC Blood Culture results may not be optimal due to an inadequate volume of blood received in culture bottles   Culture   Final    NO GROWTH 4 DAYS Performed at [facility], [place] [address] [place], [place] [address]    Report Status PENDING  Incomplete  Blood culture (routine x 2)     Status: None (Preliminary result)   Collection Time: 01/19/19  8:38 PM   Specimen: BLOOD  Result Value Ref Range Status   Specimen Description BLOOD LEFT ARM  Final   Special Requests   Final    BOTTLES DRAWN AEROBIC ONLY Blood Culture results may not be optimal due to an inadequate volume of blood received in culture bottles   Culture   Final    NO GROWTH 4 DAYS Performed at [facility], [place] [address] [place], [place] [address]    Report Status PENDING  Incomplete  Wound or Superficial Culture     Status: None   Collection Time: 01/19/19 10:58 PM   Specimen: Abscess; Wound  Result Value Ref Range Status   Specimen Description ABSCESS  Final   Special Requests NONE  Final   Gram Stain   Final    NO WBC SEEN RARE GRAM POSITIVE COCCI IN PAIRS Performed at [facility], [address], [place], [place] [address]    Culture   Final    MODERATE METHICILLIN RESISTANT STAPHYLOCOCCUS AUREUS   Report Status 01/22/2019 FINAL  Final   Organism ID, Bacteria METHICILLIN RESISTANT STAPHYLOCOCCUS AUREUS  Final      Susceptibility   Methicillin resistant staphylococcus  aureus - MIC*    CIPROFLOXACIN >=8 RESISTANT Resistant     ERYTHROMYCIN >=8 RESISTANT Resistant     GENTAMICIN <=0.5 SENSITIVE Sensitive     OXACILLIN >=4 RESISTANT Resistant     TETRACYCLINE <=1 SENSITIVE Sensitive     VANCOMYCIN <=0.5 SENSITIVE Sensitive     TRIMETH/SULFA <=10 SENSITIVE Sensitive     CLINDAMYCIN >=8 RESISTANT Resistant     RIFAMPIN <=0.5 SENSITIVE Sensitive     Inducible Clindamycin NEGATIVE Sensitive     * MODERATE METHICILLIN RESISTANT STAPHYLOCOCCUS AUREUS   Presenting for wound check for L thigh abscess for 3rd visit. Previous visits was on Bactrim, with worsening symptoms, changed to clindamycin. Wound cx grew back moderate MRSA, resistant to clindamycin. Plan to try another course of antibiotics and if continues to not improve will need to consider IV options.   [x]  Treated with clindamycin, organism resistant to prescribed antimicrobial []  Patient discharged originally without antimicrobial agent and treatment is now indicated  New antibiotic prescription: Doxycycline 100 mg twice daily for 10 days.   ED Provider: Martinique Robinson, PA-C    Antonietta Jewel, PharmD, [place] Clinical Pharmacist  01/23/2019, 9:47 AM Clinical Pharmacist Monday - Friday phone -  [phone] Saturday - Sunday phone - [phone]

## 2019-01-24 LAB — CULTURE, BLOOD (ROUTINE X 2)
Culture: NO GROWTH
Culture: NO GROWTH

## 2019-02-07 ENCOUNTER — Telehealth: Payer: Self-pay | Admitting: *Deleted

## 2019-02-07 NOTE — Telephone Encounter (Signed)
Post ED Visit - Positive Culture Follow-up: Successful Patient Follow-Up  Culture assessed and recommendations reviewed by:  []  Elenor Quinones, Pharm.D. []  Heide Guile, Pharm.D., BCPS AQ-ID []  Parks Neptune, Pharm.D., BCPS []  Alycia Rossetti, Pharm.D., BCPS []  [place], [place].D., BCPS, AAHIVP []  Legrand Como, Pharm.D., BCPS, AAHIVP []  Salome Arnt, PharmD, BCPS []  Johnnette Gourd, PharmD, BCPS []  Hughes Better, PharmD, BCPS []  Leeroy Cha, PharmD  Positive wound culture  []  Patient discharged without antimicrobial prescription and treatment is now indicated [x]  Organism is resistant to prescribed ED discharge antimicrobial []  Patient with positive blood cultures  Changes discussed with ED provider: Martinique Robinson PA New antibiotic prescription Doxycycline 100mg  BID x 10 days Called to [place], [employer] [phone]  Contacted patient, date 02/07/2019, time [place], [place] 02/07/2019, 10:28 AM

## 2019-02-27 ENCOUNTER — Observation Stay (HOSPITAL_COMMUNITY)
Admission: EM | Admit: 2019-02-27 | Discharge: 2019-03-01 | Disposition: A | Discharge status: Home / Self Care | Payer: Self-pay | Attending: Internal Medicine | Admitting: Internal Medicine

## 2019-02-27 ENCOUNTER — Emergency Department (HOSPITAL_COMMUNITY): Payer: Self-pay

## 2019-02-27 ENCOUNTER — Other Ambulatory Visit: Payer: Self-pay

## 2019-02-27 ENCOUNTER — Encounter (HOSPITAL_COMMUNITY): Payer: Self-pay

## 2019-02-27 DIAGNOSIS — K219 Gastro-esophageal reflux disease without esophagitis: Secondary | ICD-10-CM | POA: Insufficient documentation

## 2019-02-27 DIAGNOSIS — R0600 Dyspnea, unspecified: Secondary | ICD-10-CM | POA: Diagnosis present

## 2019-02-27 DIAGNOSIS — Z96652 Presence of left artificial knee joint: Secondary | ICD-10-CM | POA: Insufficient documentation

## 2019-02-27 DIAGNOSIS — E669 Obesity, unspecified: Secondary | ICD-10-CM | POA: Diagnosis present

## 2019-02-27 DIAGNOSIS — Z888 Allergy status to other drugs, medicaments and biological substances status: Secondary | ICD-10-CM | POA: Insufficient documentation

## 2019-02-27 DIAGNOSIS — I129 Hypertensive chronic kidney disease with stage 1 through stage 4 chronic kidney disease, or unspecified chronic kidney disease: Secondary | ICD-10-CM | POA: Insufficient documentation

## 2019-02-27 DIAGNOSIS — R0609 Other forms of dyspnea: Principal | ICD-10-CM | POA: Diagnosis present

## 2019-02-27 DIAGNOSIS — I10 Essential (primary) hypertension: Secondary | ICD-10-CM | POA: Diagnosis present

## 2019-02-27 DIAGNOSIS — F419 Anxiety disorder, unspecified: Secondary | ICD-10-CM | POA: Insufficient documentation

## 2019-02-27 DIAGNOSIS — G8929 Other chronic pain: Secondary | ICD-10-CM | POA: Insufficient documentation

## 2019-02-27 DIAGNOSIS — E7800 Pure hypercholesterolemia, unspecified: Secondary | ICD-10-CM | POA: Diagnosis present

## 2019-02-27 DIAGNOSIS — R05 Cough: Secondary | ICD-10-CM | POA: Insufficient documentation

## 2019-02-27 DIAGNOSIS — G4700 Insomnia, unspecified: Secondary | ICD-10-CM | POA: Insufficient documentation

## 2019-02-27 DIAGNOSIS — Z20828 Contact with and (suspected) exposure to other viral communicable diseases: Secondary | ICD-10-CM | POA: Insufficient documentation

## 2019-02-27 DIAGNOSIS — D649 Anemia, unspecified: Secondary | ICD-10-CM

## 2019-02-27 DIAGNOSIS — Z79899 Other long term (current) drug therapy: Secondary | ICD-10-CM | POA: Insufficient documentation

## 2019-02-27 DIAGNOSIS — D509 Iron deficiency anemia, unspecified: Secondary | ICD-10-CM | POA: Insufficient documentation

## 2019-02-27 DIAGNOSIS — I071 Rheumatic tricuspid insufficiency: Secondary | ICD-10-CM | POA: Insufficient documentation

## 2019-02-27 DIAGNOSIS — E785 Hyperlipidemia, unspecified: Secondary | ICD-10-CM | POA: Insufficient documentation

## 2019-02-27 DIAGNOSIS — K449 Diaphragmatic hernia without obstruction or gangrene: Secondary | ICD-10-CM

## 2019-02-27 DIAGNOSIS — M1711 Unilateral primary osteoarthritis, right knee: Secondary | ICD-10-CM | POA: Insufficient documentation

## 2019-02-27 DIAGNOSIS — Z6835 Body mass index (BMI) 35.0-35.9, adult: Secondary | ICD-10-CM | POA: Insufficient documentation

## 2019-02-27 DIAGNOSIS — M47815 Spondylosis without myelopathy or radiculopathy, thoracolumbar region: Secondary | ICD-10-CM | POA: Insufficient documentation

## 2019-02-27 DIAGNOSIS — R911 Solitary pulmonary nodule: Secondary | ICD-10-CM | POA: Insufficient documentation

## 2019-02-27 DIAGNOSIS — Z86718 Personal history of other venous thrombosis and embolism: Secondary | ICD-10-CM | POA: Insufficient documentation

## 2019-02-27 DIAGNOSIS — N183 Chronic kidney disease, stage 3 unspecified: Secondary | ICD-10-CM | POA: Insufficient documentation

## 2019-02-27 DIAGNOSIS — Z885 Allergy status to narcotic agent status: Secondary | ICD-10-CM | POA: Insufficient documentation

## 2019-02-27 LAB — BASIC METABOLIC PANEL
Anion gap: 8 (ref 5–15)
BUN: 10 mg/dL (ref 6–20)
CO2: 23 mmol/L (ref 22–32)
Calcium: 8.8 mg/dL — ABNORMAL LOW (ref 8.9–10.3)
Chloride: 108 mmol/L (ref 98–111)
Creatinine, Ser: 1.27 mg/dL — ABNORMAL HIGH (ref 0.44–1.00)
GFR calc Af Amer: 55 mL/min — ABNORMAL LOW (ref >60)
GFR calc non Af Amer: 47 mL/min — ABNORMAL LOW (ref >60)
Glucose, Bld: 95 mg/dL (ref 70–99)
Potassium: 3.5 mmol/L (ref 3.5–5.1)
Sodium: 139 mmol/L (ref 135–145)

## 2019-02-27 LAB — CBC
HCT: 26.6 % — ABNORMAL LOW (ref 36.0–46.0)
Hemoglobin: 7.9 g/dL — ABNORMAL LOW (ref 12.0–15.0)
MCH: 26 pg (ref 26.0–34.0)
MCHC: 29.7 g/dL — ABNORMAL LOW (ref 30.0–36.0)
MCV: 87.5 fL (ref 80.0–100.0)
Platelets: 489 10*3/uL — ABNORMAL HIGH (ref 150–400)
RBC: 3.04 MIL/uL — ABNORMAL LOW (ref 3.87–5.11)
RDW: 15.9 % — ABNORMAL HIGH (ref 11.5–15.5)
WBC: 8.7 10*3/uL (ref 4.0–10.5)
nRBC: 0 % (ref 0.0–0.2)

## 2019-02-27 NOTE — ED Triage Notes (Signed)
Pt states that for the past 2 weeks she has had increased SOB with a dry cough, denies fevers or sick contacts, denies CP, bilateral leg swelling that is new that past two weeks

## 2019-02-28 ENCOUNTER — Emergency Department (HOSPITAL_COMMUNITY): Payer: Self-pay

## 2019-02-28 ENCOUNTER — Encounter (HOSPITAL_COMMUNITY): Payer: Self-pay | Admitting: Internal Medicine

## 2019-02-28 DIAGNOSIS — E7800 Pure hypercholesterolemia, unspecified: Secondary | ICD-10-CM

## 2019-02-28 DIAGNOSIS — D649 Anemia, unspecified: Secondary | ICD-10-CM

## 2019-02-28 DIAGNOSIS — E669 Obesity, unspecified: Secondary | ICD-10-CM | POA: Diagnosis present

## 2019-02-28 LAB — IRON AND TIBC
Iron: 15 ug/dL — ABNORMAL LOW (ref 28–170)
Saturation Ratios: 3 % — ABNORMAL LOW (ref 10.4–31.8)
TIBC: 486 ug/dL — ABNORMAL HIGH (ref 250–450)
UIBC: 471 ug/dL

## 2019-02-28 LAB — HIV ANTIBODY (ROUTINE TESTING W REFLEX): HIV Screen 4th Generation wRfx: NONREACTIVE

## 2019-02-28 LAB — RESPIRATORY PANEL BY PCR

## 2019-02-28 LAB — TYPE AND SCREEN
ABO/RH(D): A POS
Antibody Screen: NEGATIVE
Unit division: 0

## 2019-02-28 LAB — FERRITIN: Ferritin: 5 ng/mL — ABNORMAL LOW (ref 11–307)

## 2019-02-28 LAB — RETICULOCYTES
Immature Retic Fract: 19.6 % — ABNORMAL HIGH (ref 2.3–15.9)
RBC.: 3.08 MIL/uL — ABNORMAL LOW (ref 3.87–5.11)
Retic Count, Absolute: 56.4 10*3/uL (ref 19.0–186.0)
Retic Ct Pct: 1.8 % (ref 0.4–3.1)

## 2019-02-28 LAB — BRAIN NATRIURETIC PEPTIDE: B Natriuretic Peptide: 145.5 pg/mL — ABNORMAL HIGH (ref 0.0–100.0)

## 2019-02-28 LAB — PREPARE RBC (CROSSMATCH)

## 2019-02-28 LAB — BPAM RBC
Blood Product Expiration Date: 202011112359
ISSUE DATE / TIME: 202010190953
Unit Type and Rh: 6200

## 2019-02-28 LAB — FOLATE: Folate: 8.1 ng/mL (ref >5.9)

## 2019-02-28 LAB — VITAMIN B12: Vitamin B-12: 163 pg/mL — ABNORMAL LOW (ref 180–914)

## 2019-02-28 LAB — D-DIMER, QUANTITATIVE: D-Dimer, Quant: 0.86 ug/mL-FEU — ABNORMAL HIGH (ref 0.00–0.50)

## 2019-02-28 LAB — POC OCCULT BLOOD, ED: Fecal Occult Bld: NEGATIVE

## 2019-02-28 LAB — SARS CORONAVIRUS 2 (TAT 6-24 HRS): SARS Coronavirus 2: NEGATIVE

## 2019-02-28 LAB — TROPONIN I (HIGH SENSITIVITY): Troponin I (High Sensitivity): 3 ng/L (ref <18)

## 2019-02-28 MED ORDER — ONDANSETRON HCL 4 MG PO TABS: 4.0000 mg | ORAL_TABLET | Freq: Four times a day (QID) | ORAL | Status: DC | PRN

## 2019-02-28 MED ORDER — IOHEXOL 350 MG/ML SOLN
75.0000 mL | Freq: Once | INTRAVENOUS | Status: AC | PRN
  Administered 2019-02-28: 75 mL via INTRAVENOUS

## 2019-02-28 MED ORDER — LIDOCAINE VISCOUS HCL 2 % MT SOLN
15.0000 mL | Freq: Once | OROMUCOSAL | Status: AC
  Administered 2019-02-28: 15 mL via ORAL
  Filled 2019-02-28: qty 15

## 2019-02-28 MED ORDER — ONDANSETRON HCL 4 MG/2ML IJ SOLN: 4.0000 mg | Freq: Four times a day (QID) | INTRAMUSCULAR | Status: DC | PRN

## 2019-02-28 MED ORDER — PANTOPRAZOLE SODIUM 40 MG PO TBEC
40.0000 mg | DELAYED_RELEASE_TABLET | Freq: Two times a day (BID) | ORAL | Status: DC
  Administered 2019-02-28 – 2019-03-01 (×4): 40 mg via ORAL
  Filled 2019-02-28 (×4): qty 1

## 2019-02-28 MED ORDER — HYDROCHLOROTHIAZIDE 25 MG PO TABS
25.0000 mg | ORAL_TABLET | Freq: Every day | ORAL | Status: DC
  Administered 2019-02-28 – 2019-03-01 (×2): 25 mg via ORAL
  Filled 2019-02-28 (×2): qty 1

## 2019-02-28 MED ORDER — LOSARTAN POTASSIUM 50 MG PO TABS
100.0000 mg | ORAL_TABLET | Freq: Every day | ORAL | Status: DC
  Administered 2019-02-28 – 2019-03-01 (×2): 100 mg via ORAL
  Filled 2019-02-28 (×2): qty 2

## 2019-02-28 MED ORDER — DOCUSATE SODIUM 100 MG PO CAPS
100.0000 mg | ORAL_CAPSULE | Freq: Two times a day (BID) | ORAL | Status: DC
  Administered 2019-02-28 – 2019-03-01 (×3): 100 mg via ORAL
  Filled 2019-02-28 (×3): qty 1

## 2019-02-28 MED ORDER — LORAZEPAM 0.5 MG PO TABS: 0.5000 mg | ORAL_TABLET | Freq: Four times a day (QID) | ORAL | Status: DC | PRN

## 2019-02-28 MED ORDER — HYDROCODONE-ACETAMINOPHEN 5-325 MG PO TABS: 1.0000 | ORAL_TABLET | Freq: Four times a day (QID) | ORAL | Status: DC | PRN

## 2019-02-28 MED ORDER — ACETAMINOPHEN 650 MG RE SUPP: 650.0000 mg | Freq: Four times a day (QID) | RECTAL | Status: DC | PRN

## 2019-02-28 MED ORDER — ALUM & MAG HYDROXIDE-SIMETH 200-200-20 MG/5ML PO SUSP
30.0000 mL | Freq: Once | ORAL | Status: AC
  Administered 2019-02-28: 30 mL via ORAL
  Filled 2019-02-28: qty 30

## 2019-02-28 MED ORDER — SODIUM CHLORIDE 0.9% IV SOLUTION: Freq: Once | INTRAVENOUS | Status: DC

## 2019-02-28 MED ORDER — DULOXETINE HCL 60 MG PO CPEP
60.0000 mg | ORAL_CAPSULE | Freq: Every day | ORAL | Status: DC
  Administered 2019-02-28 – 2019-03-01 (×2): 60 mg via ORAL
  Filled 2019-02-28 (×2): qty 1

## 2019-02-28 MED ORDER — ACETAMINOPHEN 325 MG PO TABS
650.0000 mg | ORAL_TABLET | Freq: Four times a day (QID) | ORAL | Status: DC | PRN
  Administered 2019-03-01: 650 mg via ORAL
  Filled 2019-02-28: qty 2

## 2019-02-28 MED ORDER — ENOXAPARIN SODIUM 40 MG/0.4ML ~~LOC~~ SOLN
40.0000 mg | SUBCUTANEOUS | Status: DC
  Administered 2019-02-28 – 2019-03-01 (×2): 40 mg via SUBCUTANEOUS
  Filled 2019-02-28 (×2): qty 0.4

## 2019-02-28 NOTE — Progress Notes (Signed)
Patient arrived to unit, presents with a mild cough. Cardiac monitoring initiated and verified. Will continue to monitor throughout my shift.

## 2019-02-28 NOTE — ED Notes (Signed)
Patient c/o sob when lying down, states she starts coughing even when talking

## 2019-02-28 NOTE — ED Notes (Signed)
Meal tray has been ordered for the patient. Pt was given a sand which bag form ED.

## 2019-02-28 NOTE — ED Provider Notes (Signed)
[facility] EMERGENCY DEPARTMENT Provider Note  CSN: [id number] Arrival date & time: 02/27/19 1924  Chief Complaint(s) Shortness of Breath  HPI [name] is a 58 y.o. female with past medical history listed below who presents to the emergency department with 2 weeks of gradually worsening shortness of breath and dry cough with mild bilateral lower extremity edema.  Shortness of breath is exacerbated with exertion, speaking, lying down.  Denies any prior cardiac history.  No associated chest pain.  No recent fevers or infections.  Denies a history of smoking.  No history of asthma.  No chronic lung disease.  No prior history of PE but did report a left lower extremity DVT following arthroscopy 18 years ago.  Patient is currently on lisinopril.   No nausea or vomiting.  No abdominal pain.  No blood in stool.  Patient is postmenopausal.  HPI  Past Medical History Past Medical History:  Diagnosis Date   Acid reflux    takes Zantac and Omeprazole daily   Anxiety    takes Citaopram daily   Arthritis    right knee   Chronic back pain    DDD   Clotting disorder (HCC)    hx of blood clot following knee scope   History of blood clots    87yrs ago and in left leg   History of bronchitis 3+yrs ago   Hyperlipidemia    takes Atorvastatin daily   Hypertension    takes Lisinopril and HCTZ daily   Insomnia    takes Elavil nightly as needed   Joint pain    Joint swelling    Paraesophageal hernia    Weakness    numbness and tingling in both feet   Patient Active Problem List   Diagnosis Date Noted   Restless legs syndrome (RLS) 06/30/2018   Arthrosis of first carpometacarpal joint 02/11/2018   Primary osteoarthritis of first carpometacarpal joint of left hand    Pain in left hand 12/23/2017   Healthcare maintenance 11/16/2017   Tinea versicolor 11/16/2017   Chronic right shoulder pain 09/02/2017   Fibromyalgia 08/11/2017   Family history  of diabetes mellitus 06/23/2016   Other fatigue 06/23/2016   Insomnia 06/23/2016   Rheumatoid arthritis ([place]) 06/23/2016   Nontraumatic incomplete tear of right rotator cuff 04/28/2016   Impingement syndrome of right shoulder 03/31/2016   Status post right partial knee replacement 09/15/2014   Acute medial meniscal injury of knee 06/14/2014   Status post total knee replacement using cement 06/08/2014   Primary osteoarthritis of right knee 06/08/2014   Screening for colon cancer    Schatzki's ring    Hiatal hernia    Dysphagia, pharyngoesophageal phase 05/26/2014   Encounter for screening colonoscopy 05/26/2014   Adrenal adenoma 05/01/2014   Breast lump 05/01/2014   Metatarsalgia of both feet 01/12/2014   Bilateral leg pain 01/11/2014   Bilateral edema of lower extremity 01/11/2014   Other osteoarthritis of spine, thoracolumbar region 12/26/2013   Hypercholesteremia 05/19/2013   Periodic health assessment, general screening, adult 04/13/2013   GERD (gastroesophageal reflux disease) 04/13/2013   HTN (hypertension) 04/13/2013   Dyspnea 04/13/2013   Home Medication(s) Prior to Admission medications   Medication Sig Start Date End Date Taking? Authorizing Provider  DULoxetine (CYMBALTA) 60 MG capsule Take 1 capsule (60 mg total) by mouth daily. 01/11/19  Yes Danford, Valetta Fuller D, NP  hydrochlorothiazide (HYDRODIURIL) 25 MG tablet Take 1 tablet (25 mg total) by mouth daily. 01/11/19  Yes Danford, Valetta Fuller  D, NP  HYDROcodone-acetaminophen (NORCO) 5-325 MG tablet Take 1 tablet by mouth every 6 (six) hours as needed. Patient taking differently: Take 1 tablet by mouth every 6 (six) hours as needed for moderate pain.  01/19/19  Yes Drenda Freeze, MD  lisinopril (ZESTRIL) 40 MG tablet Take 1 tablet (40 mg total) by mouth daily. 01/11/19  Yes Danford, Berna Spare, NP  omeprazole (PRILOSEC) 20 MG capsule TAKE 1 CAPSULE BY MOUTH TWICE DAILY BEFORE MEAL Patient taking differently: Take 20  mg by mouth 2 (two) times daily before a meal.  08/04/18  Yes Danford, Katy D, NP  ciprofloxacin (CILOXAN) 0.3 % ophthalmic solution Administer 1 drop, every 2 hours, while awake, for 2 days. Then 1 drop, every 4 hours, while awake, for the next 5 days. Patient not taking: Reported on 01/19/2019 01/11/19   Mina Marble D, NP  Ciprofloxacin HCl 0.2 % otic solution Place 0.2 mLs into the left ear 2 (two) times daily. Patient not taking: Reported on 01/19/2019 01/11/19   Mina Marble D, NP  clindamycin (CLEOCIN) 300 MG capsule Take 1 capsule (300 mg total) by mouth every 6 (six) hours. Patient not taking: Reported on 02/28/2019 01/19/19   Drenda Freeze, MD  diclofenac sodium (VOLTAREN) 1 % GEL Apply 2 g topically 4 (four) times daily as needed. Patient not taking: Reported on 01/19/2019 12/23/17   Aundra Dubin, PA-C  ketoconazole (NIZORAL) 2 % shampoo Apply 1 application topically 2 (two) times a week. Patient not taking: Reported on 01/19/2019 11/16/17   Mina Marble D, NP                                                                                                                                    Past Surgical History Past Surgical History:  Procedure Laterality Date   BREAST BIOPSY Left    BUNIONECTOMY Right 02/18/2018   Procedure: Yehuda Budd;  Surgeon: Edrick Kins, DPM;  Location: [facility];  Service: Podiatry;  Laterality: Right;   CAPSULOTOMY Bilateral 02/18/2018   Procedure: CAPSULOTOMY MPJ RELEASE JOINT 2N BILATERAL;  Surgeon: Edrick Kins, DPM;  Location: [facility];  Service: Podiatry;  Laterality: Bilateral;   CARPOMETACARPEL SUSPENSION PLASTY Left 01/27/2018   Procedure: LEFT THUMB ligament reconstruction and tendon interposition;  Surgeon: Leandrew Koyanagi, MD;  Location: [facility];  Service: Orthopedics;  Laterality: Left;   CHONDROPLASTY Right 06/28/2014   Procedure: CHONDROPLASTY;  Surgeon: Marianna Payment, MD;  Location: [facility];  Service:  Orthopedics;  Laterality: Right;   COLONOSCOPY N/A 05/29/2014   Procedure: COLONOSCOPY;  Surgeon: Daneil Dolin, MD;  [facility];  Service: Endoscopy;  Laterality: N/A;  215pm- Pt is working until 12:00 so she can't come any earlier   ESOPHAGOGASTRODUODENOSCOPY N/A 05/29/2014   Procedure: ESOPHAGOGASTRODUODENOSCOPY (EGD);  Surgeon: Daneil Dolin, MD;  [facility];  Service: Endoscopy;  Laterality:  N/A;   GANGLION CYST EXCISION Left 01/13/2002   HAMMER TOE SURGERY Bilateral 02/18/2018   Procedure: HAMMER TOE CORRECTION2ND BILATERAL;  Surgeon: Edrick Kins, DPM;  Location: [facility];  Service: Podiatry;  Laterality: Bilateral;   KNEE ARTHROSCOPY WITH MEDIAL MENISECTOMY Right 06/28/2014   Procedure: RIGHT KNEE ARTHROSCOPY WITH PARTIAL MEDIAL MENISCECTOMY AND CHONDROPLASTY;  Surgeon: Marianna Payment, MD;  Location: [facility];  Service: Orthopedics;  Laterality: Right;   MALONEY DILATION N/A 05/29/2014   Procedure: Venia Minks DILATION;  Surgeon: Daneil Dolin, MD;  [facility];  Service: Endoscopy;  Laterality: N/A;   PARTIAL KNEE ARTHROPLASTY Right 09/15/2014   Procedure: RIGHT UNICOMPARTMENTAL KNEE ARTHROPLASTY;  Surgeon: Leandrew Koyanagi, MD;  Location: [place];  Service: Orthopedics;  Laterality: Right;   TOTAL KNEE ARTHROPLASTY Left 04/08/2004   Family History Family History  Problem Relation Age of Onset   Cancer Father        lung   Cancer Sister        breast   Breast cancer Sister 50   Cancer Maternal Grandfather        lung   Cancer Paternal Grandfather        lung   Diabetes Son    Colon cancer Neg Hx     Social History Social History   Tobacco Use   Smoking status: Never Smoker   Smokeless tobacco: Never Used  Substance Use Topics   Alcohol use: No    Alcohol/week: 0.0 standard drinks   Drug use: No   Allergies Tramadol  Review of Systems Review of Systems All other systems are reviewed and are negative  for acute change except as noted in the HPI  Physical Exam Vital Signs  I have reviewed the triage vital signs BP (!) 118/95    Pulse 79    Temp 98.3 F (36.8 C) (Oral)    Resp (!) 23    LMP 02/07/2013    SpO2 100%   Physical Exam Vitals signs reviewed.  Constitutional:      General: She is not in acute distress.    Appearance: She is well-developed. She is obese. She is not diaphoretic.  HENT:     Head: Normocephalic and atraumatic.     Nose: Nose normal.  Eyes:     General: No scleral icterus.       Right eye: No discharge.        Left eye: No discharge.     Conjunctiva/sclera: Conjunctivae normal.     Pupils: Pupils are equal, round, and reactive to light.  Neck:     Musculoskeletal: Normal range of motion and neck supple.  Cardiovascular:     Rate and Rhythm: Normal rate and regular rhythm.     Heart sounds: No murmur. No friction rub. No gallop.   Pulmonary:     Effort: Pulmonary effort is normal. Tachypnea present. No respiratory distress.     Breath sounds: Normal breath sounds. No stridor or decreased air movement. No decreased breath sounds, wheezing, rhonchi or rales.  Abdominal:     General: There is no distension.     Palpations: Abdomen is soft.     Tenderness: There is no abdominal tenderness.  Genitourinary:    Rectum: Guaiac result negative.  Musculoskeletal:        General: No tenderness.     Right lower leg: 1+ Pitting Edema present.     Left lower leg: 1+ Pitting Edema present.  Skin:  General: Skin is warm and dry.     Findings: No erythema or rash.  Neurological:     Mental Status: She is alert and oriented to person, place, and time.     ED Results and Treatments Labs (all labs ordered are listed, but only abnormal results are displayed) Labs Reviewed  CBC - Abnormal; Notable for the following components:      Result Value   RBC 3.04 (*)    Hemoglobin 7.9 (*)    HCT 26.6 (*)    MCHC 29.7 (*)    RDW 15.9 (*)    Platelets 489 (*)     All other components within normal limits  BASIC METABOLIC PANEL - Abnormal; Notable for the following components:   Creatinine, Ser 1.27 (*)    Calcium 8.8 (*)    GFR calc non Af Amer 47 (*)    GFR calc Af Amer 55 (*)    All other components within normal limits  BRAIN NATRIURETIC PEPTIDE - Abnormal; Notable for the following components:   B Natriuretic Peptide 145.5 (*)    All other components within normal limits  D-DIMER, QUANTITATIVE (NOT AT [facility]) - Abnormal; Notable for the following components:   D-Dimer, Quant 0.86 (*)    All other components within normal limits  SARS CORONAVIRUS 2 (TAT 6-24 HRS)  VITAMIN B12  FOLATE  IRON AND TIBC  FERRITIN  RETICULOCYTES  POC OCCULT BLOOD, ED  PREPARE RBC (CROSSMATCH)  TYPE AND SCREEN                                                                                                                         EKG  EKG Interpretation  Date/Time:  Sunday February 27 2019 19:30:03 EDT Ventricular Rate:  88 PR Interval:  172 QRS Duration: 90 QT Interval:  366 QTC Calculation: 442 R Axis:   35 Text Interpretation:  Normal sinus rhythm Possible Lateral infarct , age undetermined Possible Inferior infarct , age undetermined Abnormal ECG No significant change since last tracing Confirmed by Addison Lank [id number]) on 02/28/2019 12:00:15 AM      Radiology Dg Chest 2 View  Result Date: 02/27/2019 CLINICAL DATA:  Shortness of breath EXAM: CHEST - 2 VIEW COMPARISON:  October 27, 2012 FINDINGS: There is mild prominence to the cardiac silhouette. Aortic knob calcifications. A moderate hiatal hernia seen. Both lungs are clear. The visualized skeletal structures are unremarkable. IMPRESSION: No acute cardiopulmonary process.  Moderate hiatal hernia. Electronically Signed   By: Prudencio Pair M.D.   On: 02/27/2019 20:00   Ct Angio Chest Pe W And/or Wo Contrast  Result Date: 02/28/2019 CLINICAL DATA:  Shortness of breath.  Positive D-dimer. EXAM: CT  ANGIOGRAPHY CHEST WITH CONTRAST TECHNIQUE: Multidetector CT imaging of the chest was performed using the standard protocol during bolus administration of intravenous contrast. Multiplanar CT image reconstructions and MIPs were obtained to evaluate the vascular anatomy. CONTRAST:  77mL OMNIPAQUE IOHEXOL 350 MG/ML SOLN COMPARISON:  Addendum FINDINGS:  Cardiovascular: Satisfactory opacification of the pulmonary arteries to the segmental level. No evidence of pulmonary embolism. Normal heart size. No pericardial effusion. Mediastinum/Nodes: Negative for adenopathy or mass. Large sliding hiatal hernia involving at least half of the stomach. Lungs/Pleura: Central airways are clear. Mild scarring or atelectasis around the hiatal hernia. There is no edema, consolidation, effusion, or pneumothorax. Ovoid right upper lobe nodule along an accessory fissure measuring 6 mm average diameter. Upper Abdomen: Partially covered gallstone that is calcified. Also partially visualized low-density right adrenal mass measuring at least 3.2 cm on axial slices, densitometry consistent with adenoma. Musculoskeletal: No acute or aggressive finding. Exaggerated thoracic kyphosis. Review of the MIP images confirms the above findings. IMPRESSION: 1. Negative for pulmonary embolism or other acute finding. 2. 6 mm average diameter right upper lobe nodule. Non-contrast chest CT at 6-12 months is recommended. If the nodule is stable at time of repeat CT, then future CT at 18-24 months (from today's scan) is considered optional for low-risk patients, but is recommended for high-risk patients. This recommendation follows the consensus statement: Guidelines for Management of Incidental Pulmonary Nodules Detected on CT Images: From the Fleischner Society 2017; Radiology 2017; 284:228-243. 3. Large hiatal hernia. 4. Cholelithiasis and right adrenal adenoma. Electronically Signed   By: Monte Fantasia M.D.   On: 02/28/2019 05:27    Pertinent labs &  imaging results that were available during my care of the patient were reviewed by me and considered in my medical decision making (see chart for details).  Medications Ordered in ED Medications  0.9 %  sodium chloride infusion (Manually program via Guardrails IV Fluids) (has no administration in time range)  alum & mag hydroxide-simeth (MAALOX/MYLANTA) 200-200-20 MG/5ML suspension 30 mL (30 mLs Oral Given 02/28/19 0247)    And  lidocaine (XYLOCAINE) 2 % viscous mouth solution 15 mL (15 mLs Oral Given 02/28/19 0245)  iohexol (OMNIPAQUE) 350 MG/ML injection 75 mL (75 mLs Intravenous Contrast Given 02/28/19 0503)                                                                                                                                    Procedures .Critical Care Performed by: Fatima Blank, MD Authorized by: Fatima Blank, MD   Critical care provider statement:    Critical care time (minutes):  45   Critical care was time spent personally by me on the following activities:  Discussions with consultants, evaluation of patient's response to treatment, examination of patient, ordering and performing treatments and interventions, ordering and review of laboratory studies, ordering and review of radiographic studies, pulse oximetry, re-evaluation of patient's condition, obtaining history from patient or surrogate and review of old charts    (including critical care time)  Medical Decision Making / ED Course I have reviewed the nursing notes for this encounter and the patient's prior records (if available in EHR or on provided paperwork).   [name]  was evaluated in Emergency Department on 02/28/2019 for the symptoms described in the history of present illness. She was evaluated in the context of the global COVID-19 pandemic, which necessitated consideration that the patient might be at risk for infection with the SARS-CoV-2 virus that causes COVID-19. Institutional  protocols and algorithms that pertain to the evaluation of patients at risk for COVID-19 are in a state of rapid change based on information released by regulatory bodies including the CDC and federal and state organizations. These policies and algorithms were followed during the patient's care in the ED.  2 weeks of gradually worsening shortness of breath with dry cough. EKG without acute ischemic changes or evidence of pericarditis. Chest x-ray without evidence of pneumonia, pulmonary edema, pneumothorax. Labs notable for normocytic anemia, with a hemoglobin of 7.9 which is a 2-1/2 g drop in 1 month.  Patient has stable renal function.  Given the dyspnea on exertion, dry cough and peripheral edema, there was a suspicion for new onset heart failure.  BNP less than 150, and chest x-ray without evidence of pulmonary edema or cardiomegaly making this less likely.  Also considering: Lisinopril induced cough and shortness of breath.  Pulmonary embolism.  - Dimer elevated  - CT PE negative for PE, PNA, PTX. Noted large hiatal hernia with surrounding atelectasis which may be contributing to her symptoms .  Symptomatic anemia:  - hemoccult negative.  - patient still having significant DOE  - will transfuse to correct and determine if this is primary cause.  Will require admission for further work up and management.      Final Clinical Impression(s) / ED Diagnoses Final diagnoses:  DOE (dyspnea on exertion)  Anemia, unspecified type  Large hiatal hernia      This chart was dictated using voice recognition software.  Despite best efforts to proofread,  errors can occur which can change the documentation meaning.   Fatima Blank, MD 02/28/19 [phone]

## 2019-02-28 NOTE — ED Notes (Signed)
Waiting on lovenox from pharmacy.

## 2019-02-28 NOTE — H&P (Signed)
History and Physical    [name] [id number] DOB: 07/05/1960 DOA: 02/27/2019  PCP: Esaw Grandchild, NP Consultants: None Patient coming from:  Home - lives alone; NOK: Daughter, Cortez, [phone]  Chief Complaint: SOB  HPI: [name] is a 58 y.o. female with medical history significant of paraesophageal hernia; HTN; HLD; and remote post-op DVT presenting with SOB. For about 2-3 weeks, she gets out of breath with any exertion.  She has to sleep sitting up because she can 't lie flat.  She can't even walk to the mailbox now.  She is short of breath going to the bathroom, walking to the kitchen, tying her shoes... She got a MRSA wound infection and it seems to have happened after this.  Very fatigued, drained.  She eats bags of ice daily.  +LE edema.  +PND.  No fevers.  No loss of appedtite.  No sick contacts - but she works at a [employer].  She had a LLE abscess in early September requiring I&D and packing.  She was treated with Bactrim -> Clindamycin -> Doxycycline.  ED Course:  Carryover, per Dr. Alcario Drought:  58 yo F with 2 week history of worsening SOB and cough. Mild peripheral edema, BMP < 150, no pulm edema on CT scan, no PE, and nothing that looks infectious. On lisinopril. HGB also down 2.5g in past 1 month, though hemoccult negative. EDP transfusing 1u PRBC.   If not helped by transfusion then EDP thinks maybe the lisinopril or maybe the hiatal hernia.   COVID is still pending, but no fever and no findings on CT.   Review of Systems: As per HPI; otherwise review of systems reviewed and negative.   Ambulatory Status:  Ambulates without assistance  Past Medical History:  Diagnosis Date  . Acid reflux    takes Zantac and Omeprazole daily  . Anxiety    takes Citaopram daily  . Arthritis    right knee  . Chronic back pain    DDD  . Clotting disorder (HCC)    hx of blood clot following knee scope  . History of bronchitis 3+yrs ago  . Hyperlipidemia    takes Atorvastatin daily  . Hypertension    takes Lisinopril and HCTZ daily  . Insomnia    takes Elavil nightly as needed  . Paraesophageal hernia     Past Surgical History:  Procedure Laterality Date  . BREAST BIOPSY Left   . BUNIONECTOMY Right 02/18/2018   Procedure: Yehuda Budd;  Surgeon: Edrick Kins, DPM;  Location: [place];  Service: Podiatry;  Laterality: Right;  . CAPSULOTOMY Bilateral 02/18/2018   Procedure: CAPSULOTOMY MPJ RELEASE JOINT 2N BILATERAL;  Surgeon: Edrick Kins, DPM;  Location: [facility];  Service: Podiatry;  Laterality: Bilateral;  . CARPOMETACARPEL SUSPENSION PLASTY Left 01/27/2018   Procedure: LEFT THUMB ligament reconstruction and tendon interposition;  Surgeon: Leandrew Koyanagi, MD;  Location: [facility];  Service: Orthopedics;  Laterality: Left;  . CHONDROPLASTY Right 06/28/2014   Procedure: CHONDROPLASTY;  Surgeon: Marianna Payment, MD;  Location: [place];  Service: Orthopedics;  Laterality: Right;  . COLONOSCOPY N/A 05/29/2014   Procedure: COLONOSCOPY;  Surgeon: Daneil Dolin, MD;  [facility];  Service: Endoscopy;  Laterality: N/A;  215pm- Pt is working until 12:00 so she can't come any earlier  . ESOPHAGOGASTRODUODENOSCOPY N/A 05/29/2014   Procedure: ESOPHAGOGASTRODUODENOSCOPY (EGD);  Surgeon: Daneil Dolin, MD;  [facility];  Service:  Endoscopy;  Laterality: N/A;  . GANGLION CYST EXCISION Left 01/13/2002  . HAMMER TOE SURGERY Bilateral 02/18/2018   Procedure: HAMMER TOE CORRECTION2ND BILATERAL;  Surgeon: Edrick Kins, DPM;  Location: [facility];  Service: Podiatry;  Laterality: Bilateral;  . KNEE ARTHROSCOPY WITH MEDIAL MENISECTOMY Right 06/28/2014   Procedure: RIGHT KNEE ARTHROSCOPY WITH PARTIAL MEDIAL MENISCECTOMY AND CHONDROPLASTY;  Surgeon: Marianna Payment, MD;  Location: [facility];  Service: Orthopedics;  Laterality: Right;  . MALONEY DILATION N/A 05/29/2014   Procedure: Venia Minks  DILATION;  Surgeon: Daneil Dolin, MD;  [facility];  Service: Endoscopy;  Laterality: N/A;  . PARTIAL KNEE ARTHROPLASTY Right 09/15/2014   Procedure: RIGHT UNICOMPARTMENTAL KNEE ARTHROPLASTY;  Surgeon: Leandrew Koyanagi, MD;  Location: [facility];  Service: Orthopedics;  Laterality: Right;  . TOTAL KNEE ARTHROPLASTY Left 04/08/2004    Social History   Socioeconomic History  . Marital status: Divorced    Spouse name: Not on file  . Number of children: Not on file  . Years of education: Not on file  . Highest education level: Not on file  Occupational History  . Occupation: works as Academic librarian  . Financial resource strain: Not on file  . Food insecurity    Worry: Not on file    Inability: Not on file  . Transportation needs    Medical: Not on file    Non-medical: Not on file  Tobacco Use  . Smoking status: Never Smoker  . Smokeless tobacco: Never Used  Substance and Sexual Activity  . Alcohol use: No    Alcohol/week: 0.0 standard drinks  . Drug use: No  . Sexual activity: Not Currently    Birth control/protection: None  Lifestyle  . Physical activity    Days per week: 7 days    Minutes per session: 60 min  . Stress: Rather much  Relationships  . Social connections    Talks on phone: More than three times a week    Gets together: Once a week    Attends religious service: Never    Active member of club or organization: No    Attends meetings of clubs or organizations: Never    Relationship status: Divorced  . Intimate partner violence    Fear of current or ex partner: No    Emotionally abused: No    Physically abused: No    Forced sexual activity: No  Other Topics Concern  . Not on file  Social History Narrative  . Not on file    Allergies  Allergen Reactions  . Tramadol Itching    "bugs crawling all over"    Family History  Problem Relation Age of Onset  . Cancer Father        lung  . Cancer Sister        breast  . Breast  cancer Sister 29  . Cancer Maternal Grandfather        lung  . Cancer Paternal Grandfather        lung  . Diabetes Son   . Colon cancer Neg Hx     Prior to Admission medications   Medication Sig Start Date End Date Taking? Authorizing Provider  DULoxetine (CYMBALTA) 60 MG capsule Take 1 capsule (60 mg total) by mouth daily. 01/11/19  Yes Danford, Valetta Fuller D, NP  hydrochlorothiazide (HYDRODIURIL) 25 MG tablet Take 1 tablet (25 mg total) by mouth daily. 01/11/19  Yes Danford, Berna Spare, NP  HYDROcodone-acetaminophen ([place])  5-325 MG tablet Take 1 tablet by mouth every 6 (six) hours as needed. Patient taking differently: Take 1 tablet by mouth every 6 (six) hours as needed for moderate pain.  01/19/19  Yes Drenda Freeze, MD  lisinopril (ZESTRIL) 40 MG tablet Take 1 tablet (40 mg total) by mouth daily. 01/11/19  Yes Danford, Berna Spare, NP  omeprazole (PRILOSEC) 20 MG capsule TAKE 1 CAPSULE BY MOUTH TWICE DAILY BEFORE MEAL Patient taking differently: Take 20 mg by mouth 2 (two) times daily before a meal.  08/04/18  Yes Danford, Katy D, NP  ciprofloxacin (CILOXAN) 0.3 % ophthalmic solution Administer 1 drop, every 2 hours, while awake, for 2 days. Then 1 drop, every 4 hours, while awake, for the next 5 days. Patient not taking: Reported on 01/19/2019 01/11/19   Mina Marble D, NP  Ciprofloxacin HCl 0.2 % otic solution Place 0.2 mLs into the left ear 2 (two) times daily. Patient not taking: Reported on 01/19/2019 01/11/19   Mina Marble D, NP  clindamycin (CLEOCIN) 300 MG capsule Take 1 capsule (300 mg total) by mouth every 6 (six) hours. Patient not taking: Reported on 02/28/2019 01/19/19   Drenda Freeze, MD  diclofenac sodium (VOLTAREN) 1 % GEL Apply 2 g topically 4 (four) times daily as needed. Patient not taking: Reported on 01/19/2019 12/23/17   Aundra Dubin, PA-C  ketoconazole (NIZORAL) 2 % shampoo Apply 1 application topically 2 (two) times a week. Patient not taking: Reported on 01/19/2019 11/16/17    Mina Marble D, NP    Physical Exam: Vitals:   02/28/19 0051 02/28/19 0100 02/28/19 0200 02/28/19 0330  BP: (!) 118/95 139/85 (!) 148/73 (!) 160/90  Pulse: 79 73 80 97  Resp: (!) 23 17 20 20   Temp:    98.7 F (37.1 C)  TempSrc:    Oral  SpO2: 100% 100% 100% 100%     . General:  Appears calm and comfortable and is NAD; mildly anxious, intermittent paroxysmal cough, appears SOB with O2 sats of 100% on RA . Eyes:  PERRL, EOMI, normal lids, iris . ENT:  grossly normal hearing, lips & tongue, mmm; appropriate dentition . Neck:  no LAD, masses or thyromegaly . Cardiovascular:  RRR, no m/r/g. Trace LE edema.  Marland Kitchen Respiratory:   CTA bilaterally with no wheezes/rales/rhonchi.  Normal to mildly increased respiratory effort. . Abdomen:  soft, NT, ND, NABS . Back:   normal alignment, no CVAT . Skin:  no rash or induration seen on limited exam . Musculoskeletal:  grossly normal tone BUE/BLE, good ROM, no bony abnormality . Psychiatric:  Mildly anxious mood and affect, speech fluent and appropriate, AOx3 . Neurologic:  CN 2-12 grossly intact, moves all extremities in coordinated fashion, sensation intact    Radiological Exams on Admission: Dg Chest 2 View  Result Date: 02/27/2019 CLINICAL DATA:  Shortness of breath EXAM: CHEST - 2 VIEW COMPARISON:  October 27, 2012 FINDINGS: There is mild prominence to the cardiac silhouette. Aortic knob calcifications. A moderate hiatal hernia seen. Both lungs are clear. The visualized skeletal structures are unremarkable. IMPRESSION: No acute cardiopulmonary process.  Moderate hiatal hernia. Electronically Signed   By: Prudencio Pair M.D.   On: 02/27/2019 20:00   Ct Angio Chest Pe W And/or Wo Contrast  Result Date: 02/28/2019 CLINICAL DATA:  Shortness of breath.  Positive D-dimer. EXAM: CT ANGIOGRAPHY CHEST WITH CONTRAST TECHNIQUE: Multidetector CT imaging of the chest was performed using the standard protocol during bolus administration of intravenous  contrast. Multiplanar CT image reconstructions and MIPs were obtained to evaluate the vascular anatomy. CONTRAST:  21mL OMNIPAQUE IOHEXOL 350 MG/ML SOLN COMPARISON:  Addendum FINDINGS: Cardiovascular: Satisfactory opacification of the pulmonary arteries to the segmental level. No evidence of pulmonary embolism. Normal heart size. No pericardial effusion. Mediastinum/Nodes: Negative for adenopathy or mass. Large sliding hiatal hernia involving at least half of the stomach. Lungs/Pleura: Central airways are clear. Mild scarring or atelectasis around the hiatal hernia. There is no edema, consolidation, effusion, or pneumothorax. Ovoid right upper lobe nodule along an accessory fissure measuring 6 mm average diameter. Upper Abdomen: Partially covered gallstone that is calcified. Also partially visualized low-density right adrenal mass measuring at least 3.2 cm on axial slices, densitometry consistent with adenoma. Musculoskeletal: No acute or aggressive finding. Exaggerated thoracic kyphosis. Review of the MIP images confirms the above findings. IMPRESSION: 1. Negative for pulmonary embolism or other acute finding. 2. 6 mm average diameter right upper lobe nodule. Non-contrast chest CT at 6-12 months is recommended. If the nodule is stable at time of repeat CT, then future CT at 18-24 months (from today's scan) is considered optional for low-risk patients, but is recommended for high-risk patients. This recommendation follows the consensus statement: Guidelines for Management of Incidental Pulmonary Nodules Detected on CT Images: From the Fleischner Society 2017; Radiology 2017; 284:228-243. 3. Large hiatal hernia. 4. Cholelithiasis and right adrenal adenoma. Electronically Signed   By: Monte Fantasia M.D.   On: 02/28/2019 05:27    EKG: Independently reviewed.  NSR with rate 88; nonspecific ST changes with no evidence of acute ischemia; NSCSLT   Labs on Admission: I have personally reviewed the available labs and  imaging studies at the time of the admission.  Pertinent labs:   BUN 10/Creatinine 1.27/GFR 47 - stable BNP 145.5 WBC 8.7 Hgb 7.9 Platelets 489 D-dimer 0.86 COVID pending Heme negative Wound culture 9/9 MRSA   Assessment/Plan Principal Problem:   Dyspnea Active Problems:   HTN (hypertension)   Hypercholesteremia   Hiatal hernia   Obesity (BMI 35.0-39.9 without comorbidity)    Dyspnea -Uncertain etiology -Symptoms began after recent wound infection with culture-proven MRSA, so MRSA bacteremia with endocarditis is a consideration; will check blood cultures and place on contact precautions -She is not hypoxic despite profound SOB with cough -There is no parenchymal disease on CTA -She does have a pulmonary nodule which will need outpatient f/u, but this is not anticipated to cause her symptoms -CHF is a consideration; her BNP is minimally elevated and she has LE edema.  Will check Echo and monitor on tele -Symptomatic anemia (although only 7.9 - see below for further consideration about anemia) in the setting of CAD would be a consideration; will check HS troponin x 1 but EKG does not create suspicion for ACS -Pertussis could present with SOB and pertussive cough; will check RVP but no empiric antibiotics at this time -COVID is pending and seems unlikely, but will leave as PUI for now -Will observe for now -Cough could be associated with reflux/hiatal hernia (see below) or Lisinopril (see below), although these are both lesser considerations -Anxiety is also a consideration; continue Cymbalta, will give prn Ativan  Anemia -MCV is 80-100, indicative of normocytic anemia -This generally occurs in chronic disease and acute blood loss anemia -She does not have significant known chronic disease  -The drop in her Hgb is fairly acute -Heme negative and she does not report blood loss (no longer menstruating) -Will follow for now -Consider transfusion pending symptoms  and Hgb  trending -Consider iron infusion if iron deficient -Recheck CBC in AM -If she does have underlying CAD, this could be the source of her symptoms -If no underlying CAD (and this was not noted on CTA), Hgb 7.9 would be an unlikely (but possible) cause of her symptoms -Anemia panel is pending  Hiatal hernia -Seems unlikely that this is related to current presentation but this is a consideration -She has been taking omeprazole 20 mg BID -Will change to Protonix 40 mg BID -May need outpatient surgery f/u if symptoms develop, but she reports no GERD symptoms at this time  HTN -Continue HCTZ -Discontinue Lisinopril in case this is atypical ACE-associated cough -Will start Losartan  HLD -She does not appear to be taking medications for this issue at this time -Prior LDL has been consistently 150-170, most recently 155 in 06/2018 -She may benefit from addition of statin, particularly if CAD is suspected  Obesity -BMI 35.4 -Weight loss should be encouraged -Outpatient PCP/bariatric medicine/bariatric surgery f/u encouraged  Stage 3 CKD -Likely due to long-standing HTN -Appears to be stable at this time -Recheck BMP in AM  Chronic pain -Continue Cymbalta -She is not taking chronic opiates and does not appear to be at high risk for diversion, overdose -Continue prn Norco -Check UDS    Note: This patient has been tested and is pending for the novel coronavirus COVID-19.  She will remain a PUI until test results return.  DVT prophylaxis:  Lovenox  Code Status:  Full - confirmed with patient Family Communication: None present Disposition Plan:  Home once clinically improved Consults called: Pulmonology (telephone only)  Admission status: It is my clinical opinion that referral for OBSERVATION is reasonable and necessary in this patient based on the above information provided. The aforementioned taken together are felt to place the patient at high risk for further clinical  deterioration. However it is anticipated that the patient may be medically stable for discharge from the hospital within 24 to 48 hours.    Karmen Bongo MD [facility]   How to contact the [facility] Attending or Consulting provider [place] or covering provider during after hours [place], for this patient?  1. Check the care team in [facility] and look for a) attending/consulting [facility] provider listed and b) the [facility] team listed 2. Log into [email or web address] and use [facility]'s universal password to access. If you do not have the password, please contact the hospital operator. 3. Locate the [facility] provider you are looking for under [facility] and page to a number that you can be directly reached. 4. If you still have difficulty reaching the provider, please page the [facility] (Director on Call) for the Hospitalists listed on amion for assistance.   02/28/2019, 8:33 AM

## 2019-03-01 ENCOUNTER — Observation Stay (HOSPITAL_BASED_OUTPATIENT_CLINIC_OR_DEPARTMENT_OTHER): Payer: Self-pay

## 2019-03-01 ENCOUNTER — Other Ambulatory Visit: Payer: Self-pay

## 2019-03-01 DIAGNOSIS — K449 Diaphragmatic hernia without obstruction or gangrene: Secondary | ICD-10-CM

## 2019-03-01 DIAGNOSIS — R0602 Shortness of breath: Secondary | ICD-10-CM

## 2019-03-01 DIAGNOSIS — E669 Obesity, unspecified: Secondary | ICD-10-CM

## 2019-03-01 DIAGNOSIS — I10 Essential (primary) hypertension: Secondary | ICD-10-CM

## 2019-03-01 DIAGNOSIS — D649 Anemia, unspecified: Secondary | ICD-10-CM

## 2019-03-01 DIAGNOSIS — I361 Nonrheumatic tricuspid (valve) insufficiency: Secondary | ICD-10-CM

## 2019-03-01 LAB — CBC
HCT: 26.5 % — ABNORMAL LOW (ref 36.0–46.0)
Hemoglobin: 8 g/dL — ABNORMAL LOW (ref 12.0–15.0)
MCH: 25.7 pg — ABNORMAL LOW (ref 26.0–34.0)
MCHC: 30.2 g/dL (ref 30.0–36.0)
MCV: 85.2 fL (ref 80.0–100.0)
Platelets: 479 10*3/uL — ABNORMAL HIGH (ref 150–400)
RBC: 3.11 MIL/uL — ABNORMAL LOW (ref 3.87–5.11)
RDW: 15.7 % — ABNORMAL HIGH (ref 11.5–15.5)
WBC: 8.8 10*3/uL (ref 4.0–10.5)
nRBC: 0 % (ref 0.0–0.2)

## 2019-03-01 LAB — BASIC METABOLIC PANEL
Anion gap: 8 (ref 5–15)
BUN: 12 mg/dL (ref 6–20)
CO2: 26 mmol/L (ref 22–32)
Calcium: 9.3 mg/dL (ref 8.9–10.3)
Chloride: 104 mmol/L (ref 98–111)
Creatinine, Ser: 1.1 mg/dL — ABNORMAL HIGH (ref 0.44–1.00)
GFR calc Af Amer: >60 mL/min (ref >60)
GFR calc non Af Amer: 56 mL/min — ABNORMAL LOW (ref >60)
Glucose, Bld: 113 mg/dL — ABNORMAL HIGH (ref 70–99)
Potassium: 4.4 mmol/L (ref 3.5–5.1)
Sodium: 138 mmol/L (ref 135–145)

## 2019-03-01 LAB — MRSA PCR SCREENING: MRSA by PCR: NEGATIVE

## 2019-03-01 LAB — ECHOCARDIOGRAM COMPLETE
Height: 63 in
Weight: 3475.2 oz

## 2019-03-01 MED ORDER — SODIUM CHLORIDE 0.9 % IV SOLN
510.0000 mg | Freq: Once | INTRAVENOUS | Status: AC
  Administered 2019-03-01: 510 mg via INTRAVENOUS
  Filled 2019-03-01: qty 17

## 2019-03-01 MED ORDER — FERROUS SULFATE 325 (65 FE) MG PO TBEC: 325.0000 mg | DELAYED_RELEASE_TABLET | Freq: Two times a day (BID) | ORAL | 3 refills | Status: DC

## 2019-03-01 MED ORDER — VITAMIN C 250 MG PO TABS: 250.0000 mg | ORAL_TABLET | Freq: Every day | ORAL | 2 refills | Status: DC

## 2019-03-01 MED ORDER — PANTOPRAZOLE SODIUM 40 MG PO TBEC: 40.0000 mg | DELAYED_RELEASE_TABLET | Freq: Every day | ORAL | 2 refills | Status: DC

## 2019-03-01 MED FILL — VITAMIN C 500 MG TABLET: 500 | 30 days supply | Qty: 15 | Fill #0

## 2019-03-01 MED FILL — FERROUS SULFATE 325 MG TAB: 325 (65 FE) | 30 days supply | Qty: 60 | Fill #0

## 2019-03-01 MED FILL — PANTOPRAZOLE SOD DR 40 MG T: 40 | 30 days supply | Qty: 30 | Fill #0

## 2019-03-01 NOTE — Progress Notes (Signed)
Patient alert and oriented denies pain, iv and tele , d/c instruction explain and given to the patient all questions answered. Patient awaiting her ride will d/c patient home per order

## 2019-03-01 NOTE — Discharge Summary (Signed)
Physician Discharge Summary  [name]:[id number] DOB: 05-20-1960 DOA: 02/27/2019  PCP: Julaine Fusi, NP  Admit date: 02/27/2019 Discharge date: 03/01/2019  Admitted From: Home  Discharge disposition: Home   Recommendations for Outpatient Follow-Up:    Follow up with your primary care provider in one week.   Discharge Diagnosis:   Principal Problem:   Dyspnea Active Problems:   HTN (hypertension)   Hypercholesteremia   Hiatal hernia   Obesity (BMI 35.0-39.9 without comorbidity)   Normocytic anemia, not due to blood loss   Discharge Condition: Improved.  Diet recommendation: Low sodium, heart healthy.    Wound care: None.  Code status: Full.   History of Present Illness:   [name] is a 58 y.o. female with medical history significant of paraesophageal hernia; HTN; HLD; and remote post-op DVT presented to the hospital with shortness of breath for the last 2 to 3 weeks with dyspnea on exertion.  She stated that she could not lie flat because she was constantly coughing when she lied down.  Patient denies any heart or lung problems.  She got a MRSA wound infection and it seems to have happened after this.  Patient denies any fever chest pain palpitation. She had a LLE abscess in early September requiring I&D and packing.  She was treated with Bactrim -> Clindamycin -> Doxycycline.  In the ED,mild peripheral edema, BMP < 150, no pulm edema on CT scan, no PE, and nothing that looks infectious. On lisinopril. HGB also down 2.5g in past 1 month, though hemoccult negative.    Hospital Course:   Following conditions were addressed during hospitalization,  Dyspnea on exertion. Likely from anemia.  Patient stated that her symptoms began after recent wound infection with culture-proven MRSA.  Patient however does not have any fever, chills or rigor. She was not hypoxic despite complains of dyspnea on exertion and cough.  CT angiogram was performed without any  pulmonary infiltrates or pulmonary embolism.  She did have a pulmonary nodule which will need outpatient follow-up.  EKG was within normal limits. Did have a minimally elevated BNP with mild lower extremity edema.  2D echocardiogram was performed which showed no valvular dysfunction, regurgitation vegetation and left-ventricular ejection fraction was completely normal.  Blood cultures was negative in 24 hours.  Iron deficiency anemia..hemoglobin of 7.9 with mild renal sufficiency.  Iron profile reviewed low iron and ferritin..  Patient does have pica for ice.  Patient received 1 dose of Feraheme while in the hospital.  She will be given prescription of iron sulfate on discharge.  Her hemoglobin needs to be monitored as outpatient.  Might benefit from evaluation with nephrology with her mild renal insufficiency.  Patient was Hemoccult negative.  Cough especially on lying down.  Likely from severe GERD and history of hiatal hernia..  Patient states that she is on omeprazole.  Will change Protonix twice a day for now.  Prescription will be given on discharge.   Essential hypertension -Continue HCTZ on discharge.  HLD Recent LDL of 155 in 06/2018.  Discussing with primary care physician advised  Morbid obesity -BMI 35.4.  This could be contributing to her dyspnea on exertion but patient thinks that this is something new.  She denies new weight gain. -Outpatient PCP/bariatric medicine/bariatric surgery f/u encouraged  Stage 3 CKD -Likely due to long-standing HTN   Chronic pain -Continue Cymbalta  Disposition.  At this time, patient is stable for disposition home.  Patient was advised to follow-up with  her primary care physician regarding her anemia and potential nephrology follow-up of her CKD and anemia.   Medical Consultants:    None.   Subjective:   Today, patient feels okay today.  Complains of dyspnea on exertion.  Has cough when lying down.  Patient states that she has been  craving for ice recently.  Denies recent weight gain.  Denies chest pain, palpitation lung or heart problems.  Discharge Exam:   Vitals:   03/01/19 0730 03/01/19 1200  BP: 140/73 (!) 145/81  Pulse: 85 80  Resp: 18 18  Temp: 98.2 F (36.8 C) 98.4 F (36.9 C)  SpO2: 99% 95%   Vitals:   02/28/19 2310 03/01/19 0420 03/01/19 0730 03/01/19 1200  BP: (!) 155/92 (!) 163/89 140/73 (!) 145/81  Pulse: 85 77 85 80  Resp: 20 18 18 18   Temp: 97.9 F (36.6 C) 98 F (36.7 C) 98.2 F (36.8 C) 98.4 F (36.9 C)  TempSrc: Oral Oral Oral Oral  SpO2: 97% 95% 99% 95%  Weight: 98.5 kg 98.5 kg    Height: 5\' 3"  (1.6 m)       General exam: Appears calm and comfortable ,Not in distress HEENT:PERRL,Oral mucosa moist Respiratory system: Bilateral equal air entry, normal vesicular breath sounds, no wheezes or crackles  Cardiovascular system: S1 & S2 heard, RRR.  Gastrointestinal system: Abdomen is nondistended, soft and nontender. No organomegaly or masses felt. Normal bowel sounds heard. Central nervous system: Alert and oriented. No focal neurological deficits. Extremities: No edema, no clubbing ,no cyanosis, distal peripheral pulses palpable. Skin: No rashes, lesions or ulcers,no icterus ,no pallor MSK: Normal muscle bulk,tone ,power    Procedures:    None  The results of significant diagnostics from this hospitalization (including imaging, microbiology, ancillary and laboratory) are listed below for reference.     Diagnostic Studies:   Dg Chest 2 View  Result Date: 02/27/2019 CLINICAL DATA:  Shortness of breath EXAM: CHEST - 2 VIEW COMPARISON:  October 27, 2012 FINDINGS: There is mild prominence to the cardiac silhouette. Aortic knob calcifications. A moderate hiatal hernia seen. Both lungs are clear. The visualized skeletal structures are unremarkable. IMPRESSION: No acute cardiopulmonary process.  Moderate hiatal hernia. Electronically Signed   By: Jonna Clark M.D.   On: 02/27/2019 20:00     Ct Angio Chest Pe W And/or Wo Contrast  Result Date: 02/28/2019 CLINICAL DATA:  Shortness of breath.  Positive D-dimer. EXAM: CT ANGIOGRAPHY CHEST WITH CONTRAST TECHNIQUE: Multidetector CT imaging of the chest was performed using the standard protocol during bolus administration of intravenous contrast. Multiplanar CT image reconstructions and MIPs were obtained to evaluate the vascular anatomy. CONTRAST:  75mL OMNIPAQUE IOHEXOL 350 MG/ML SOLN COMPARISON:  Addendum FINDINGS: Cardiovascular: Satisfactory opacification of the pulmonary arteries to the segmental level. No evidence of pulmonary embolism. Normal heart size. No pericardial effusion. Mediastinum/Nodes: Negative for adenopathy or mass. Large sliding hiatal hernia involving at least half of the stomach. Lungs/Pleura: Central airways are clear. Mild scarring or atelectasis around the hiatal hernia. There is no edema, consolidation, effusion, or pneumothorax. Ovoid right upper lobe nodule along an accessory fissure measuring 6 mm average diameter. Upper Abdomen: Partially covered gallstone that is calcified. Also partially visualized low-density right adrenal mass measuring at least 3.2 cm on axial slices, densitometry consistent with adenoma. Musculoskeletal: No acute or aggressive finding. Exaggerated thoracic kyphosis. Review of the MIP images confirms the above findings. IMPRESSION: 1. Negative for pulmonary embolism or other acute finding. 2. 6 mm average  diameter right upper lobe nodule. Non-contrast chest CT at 6-12 months is recommended. If the nodule is stable at time of repeat CT, then future CT at 18-24 months (from today's scan) is considered optional for low-risk patients, but is recommended for high-risk patients. This recommendation follows the consensus statement: Guidelines for Management of Incidental Pulmonary Nodules Detected on CT Images: From the Fleischner Society 2017; Radiology 2017; 284:228-243. 3. Large hiatal hernia. 4.  Cholelithiasis and right adrenal adenoma. Electronically Signed   By: Marnee Spring M.D.   On: 02/28/2019 05:27     Labs:   Basic Metabolic Panel: Recent Labs  Lab 02/27/19 1941 03/01/19 0452  NA 139 138  K 3.5 4.4  CL 108 104  CO2 23 26  GLUCOSE 95 113*  BUN 10 12  CREATININE 1.27* 1.10*  CALCIUM 8.8* 9.3   GFR Estimated Creatinine Clearance: 63.8 mL/min (A) (by C-G formula based on SCr of 1.1 mg/dL (H)). Liver Function Tests: No results for input(s): AST, ALT, ALKPHOS, BILITOT, PROT, ALBUMIN in the last 168 hours. No results for input(s): LIPASE, AMYLASE in the last 168 hours. No results for input(s): AMMONIA in the last 168 hours. Coagulation profile No results for input(s): INR, PROTIME in the last 168 hours.  CBC: Recent Labs  Lab 02/27/19 1941 03/01/19 0452  WBC 8.7 8.8  HGB 7.9* 8.0*  HCT 26.6* 26.5*  MCV 87.5 85.2  PLT 489* 479*   Cardiac Enzymes: No results for input(s): CKTOTAL, CKMB, CKMBINDEX, TROPONINI in the last 168 hours. BNP: Invalid input(s): POCBNP CBG: No results for input(s): GLUCAP in the last 168 hours. D-Dimer Recent Labs    02/28/19 0208  DDIMER 0.86*   Hgb A1c No results for input(s): HGBA1C in the last 72 hours. Lipid Profile No results for input(s): CHOL, HDL, LDLCALC, TRIG, CHOLHDL, LDLDIRECT in the last 72 hours. Thyroid function studies No results for input(s): TSH, T4TOTAL, T3FREE, THYROIDAB in the last 72 hours.  Invalid input(s): FREET3 Anemia work up Recent Labs    02/28/19 0631 02/28/19 1710  VITAMINB12 163*  --   FOLATE 8.1  --   FERRITIN 5*  --   TIBC 486*  --   IRON 15*  --   RETICCTPCT  --  1.8   Microbiology Recent Results (from the past 240 hour(s))  SARS CORONAVIRUS 2 (TAT 6-24 HRS) Nasopharyngeal Nasopharyngeal Swab     Status: None   Collection Time: 02/28/19  6:21 AM   Specimen: Nasopharyngeal Swab  Result Value Ref Range Status   SARS Coronavirus 2 NEGATIVE NEGATIVE Final    Comment:  (NOTE) SARS-CoV-2 target nucleic acids are NOT DETECTED. The SARS-CoV-2 RNA is generally detectable in upper and lower respiratory specimens during the acute phase of infection. Negative results do not preclude SARS-CoV-2 infection, do not rule out co-infections with other pathogens, and should not be used as the sole basis for treatment or other patient management decisions. Negative results must be combined with clinical observations, patient history, and epidemiological information. The expected result is Negative. Fact Sheet for Patients: HairSlick.no Fact Sheet for Healthcare Providers: quierodirigir.com This test is not yet approved or cleared by the [place] FDA and  has been authorized for detection and/or diagnosis of SARS-CoV-2 by FDA under an Emergency Use Authorization (EUA). This EUA will remain  in effect (meaning this test can be used) for the duration of the COVID-19 declaration under Section 56 4(b)(1) of the Act, 21 U.S.C. section 360bbb-3(b)(1), unless the authorization is terminated or  revoked sooner. Performed at [facility], [address], [place], [place] [address]   Culture, blood (routine x 2)     Status: None (Preliminary result)   Collection Time: 02/28/19 10:30 AM   Specimen: BLOOD LEFT HAND  Result Value Ref Range Status   Specimen Description BLOOD LEFT HAND  Final   Special Requests   Final    BOTTLES DRAWN AEROBIC AND ANAEROBIC Blood Culture adequate volume   Culture   Final    NO GROWTH < 24 HOURS Performed at [facility], [address], [place], [place] [address]    Report Status PENDING  Incomplete  Culture, blood (routine x 2)     Status: None (Preliminary result)   Collection Time: 02/28/19 12:29 PM   Specimen: BLOOD RIGHT HAND  Result Value Ref Range Status   Specimen Description BLOOD RIGHT HAND  Final   Special Requests   Final    BOTTLES DRAWN AEROBIC AND  ANAEROBIC Blood Culture results may not be optimal due to an inadequate volume of blood received in culture bottles   Culture   Final    NO GROWTH < 24 HOURS Performed at [facility], [address] [place], [place] [address]    Report Status PENDING  Incomplete  Respiratory Panel by PCR     Status: None   Collection Time: 02/28/19  1:15 PM   Specimen: Nasopharyngeal Swab; Respiratory  Result Value Ref Range Status   Adenovirus NOT DETECTED NOT DETECTED Final   Coronavirus 229E NOT DETECTED NOT DETECTED Final    Comment: (NOTE) The Coronavirus on the Respiratory Panel, DOES NOT test for the novel  Coronavirus (2019 nCoV)    Coronavirus HKU1 NOT DETECTED NOT DETECTED Final   Coronavirus NL63 NOT DETECTED NOT DETECTED Final   Coronavirus OC43 NOT DETECTED NOT DETECTED Final   Metapneumovirus NOT DETECTED NOT DETECTED Final   Rhinovirus / Enterovirus NOT DETECTED NOT DETECTED Final   Influenza A NOT DETECTED NOT DETECTED Final   Influenza B NOT DETECTED NOT DETECTED Final   Parainfluenza Virus 1 NOT DETECTED NOT DETECTED Final   Parainfluenza Virus 2 NOT DETECTED NOT DETECTED Final   Parainfluenza Virus 3 NOT DETECTED NOT DETECTED Final   Parainfluenza Virus 4 NOT DETECTED NOT DETECTED Final   Respiratory Syncytial Virus NOT DETECTED NOT DETECTED Final   Bordetella pertussis NOT DETECTED NOT DETECTED Final   Chlamydophila pneumoniae NOT DETECTED NOT DETECTED Final   Mycoplasma pneumoniae NOT DETECTED NOT DETECTED Final    Comment: Performed at [facility], [address] [place], [place] [address]  MRSA PCR Screening     Status: None   Collection Time: 02/28/19 11:29 PM   Specimen: Nasal Mucosa; Nasopharyngeal  Result Value Ref Range Status   MRSA by PCR NEGATIVE NEGATIVE Final    Comment:        The GeneXpert MRSA Assay (FDA approved for NASAL specimens only), is one component of a comprehensive MRSA colonization surveillance program. It is not intended to  diagnose MRSA infection nor to guide or monitor treatment for MRSA infections. Performed at [facility], [address] [place], [place] [address]      Discharge Instructions:   Discharge Instructions    Diet regular   Complete by: As directed    Discharge instructions   Complete by: As directed    Please follow-up with your primary care physician within 1 week.  Check your blood work  at that time.  Please discuss about your anemia.  You might need be seen by nephrology for your kidney function and anemia.  Continue to take iron as prescribed.  Avoid overexertion.   Increase activity slowly   Complete by: As directed      Allergies as of 03/01/2019      Reactions   Tramadol Itching   "bugs crawling all over"      Medication List    STOP taking these medications   omeprazole 20 MG capsule Commonly known as: PRILOSEC     TAKE these medications   DULoxetine 60 MG capsule Commonly known as: CYMBALTA Take 1 capsule (60 mg total) by mouth daily.   ferrous sulfate 325 (65 FE) MG EC tablet Take 1 tablet (325 mg total) by mouth 2 (two) times daily.   hydrochlorothiazide 25 MG tablet Commonly known as: HYDRODIURIL Take 1 tablet (25 mg total) by mouth daily.   HYDROcodone-acetaminophen 5-325 MG tablet Commonly known as: Norco Take 1 tablet by mouth every 6 (six) hours as needed. What changed: reasons to take this   pantoprazole 40 MG tablet Commonly known as: Protonix Take 1 tablet (40 mg total) by mouth daily.   vitamin C 250 MG tablet Commonly known as: ASCORBIC ACID Take 1 tablet (250 mg total) by mouth daily.       Time coordinating discharge: 39 minutes  Signed:  Kelvis Berger  [facility] 03/01/2019, 1:48 PM

## 2019-03-01 NOTE — Plan of Care (Signed)
  Problem: Education: Goal: Knowledge of General Education information will improve Description: Including pain rating scale, medication(s)/side effects and non-pharmacologic comfort measures Outcome: Progressing   Problem: Health Behavior/Discharge Planning: Goal: Ability to manage health-related needs will improve Outcome: Progressing   Problem: Clinical Measurements: Goal: Respiratory complications will improve Outcome: Progressing   

## 2019-03-01 NOTE — TOC Initial Note (Signed)
Transition of Care [facility]) - Initial/Assessment Note    Patient Details  Name: [name] MRN: [id number] Date of Birth: Dec 29, 1960  Transition of Care [facility]) CM/SW Contact:    Alberteen Sam, [place] Phone Number: [phone] 03/01/2019, 2:23 PM  Clinical Narrative:                  CSW spoke with patient regarding discharge planning, patient reports she sometimes has trouble getting meds. She reports she only makes $200 every 2 weeks. Patient is agreeable to getting discharge meds sent to [facility] pharmacy to be filled and reports having $10-$15 on her person to pay for them. RNCM working on match for meds currently, meds to be delivered at bedside before discharge.   Expected Discharge Plan: Home/Self Care Barriers to Discharge: No Barriers Identified   Patient Goals and CMS Choice Patient states their goals for this hospitalization and ongoing recovery are:: to go home CMS Medicare.gov Compare Post Acute Care list provided to:: Patient Choice offered to / list presented to : Patient  Expected Discharge Plan and Services Expected Discharge Plan: Home/Self Care       Living arrangements for the past 2 months: Single Family Home Expected Discharge Date: 03/01/19                                    Prior Living Arrangements/Services Living arrangements for the past 2 months: Single Family Home Lives with:: Self Patient language and need for interpreter reviewed:: Yes Do you feel safe going back to the place where you live?: Yes      Need for Family Participation in Patient Care: No (Comment) Care giver support system in place?: Yes (comment)   Criminal Activity/Legal Involvement Pertinent to Current Situation/Hospitalization: No - Comment as needed  Activities of Daily Living Home Assistive Devices/Equipment: None ADL Screening (condition at time of admission) Patient's cognitive ability adequate to safely complete daily activities?: Yes Is the patient deaf or have  difficulty hearing?: No Does the patient have difficulty seeing, even when wearing glasses/contacts?: No Does the patient have difficulty concentrating, remembering, or making decisions?: No Patient able to express need for assistance with ADLs?: Yes Does the patient have difficulty dressing or bathing?: No Independently performs ADLs?: Yes (appropriate for developmental age) Does the patient have difficulty walking or climbing stairs?: Yes Weakness of Legs: None Weakness of Arms/Hands: None  Permission Sought/Granted Permission sought to share information with : Case Manager Permission granted to share information with : Yes, Verbal Permission Granted              Emotional Assessment Appearance:: Appears stated age Attitude/Demeanor/Rapport: Gracious Affect (typically observed): Calm Orientation: : Oriented to Self, Oriented to Place, Oriented to  Time, Oriented to Situation Alcohol / Substance Use: Not Applicable Psych Involvement: No (comment)  Admission diagnosis:  DOE (dyspnea on exertion) [R06.00] Anemia, unspecified type [D64.9] Large hiatal hernia [K44.9] Dyspnea [R06.00] Patient Active Problem List   Diagnosis Date Noted  . Obesity (BMI 35.0-39.9 without comorbidity) 02/28/2019  . Normocytic anemia, not due to blood loss 02/28/2019  . Restless legs syndrome (RLS) 06/30/2018  . Arthrosis of first carpometacarpal joint 02/11/2018  . Primary osteoarthritis of first carpometacarpal joint of left hand   . Pain in left hand 12/23/2017  . Healthcare maintenance 11/16/2017  . Tinea versicolor 11/16/2017  . Chronic right shoulder pain 09/02/2017  . Fibromyalgia 08/11/2017  . Family history  of diabetes mellitus 06/23/2016  . Other fatigue 06/23/2016  . Insomnia 06/23/2016  . Rheumatoid arthritis ([place]) 06/23/2016  . Nontraumatic incomplete tear of right rotator cuff 04/28/2016  . Impingement syndrome of right shoulder 03/31/2016  . Status post right partial knee  replacement 09/15/2014  . Acute medial meniscal injury of knee 06/14/2014  . Status post total knee replacement using cement 06/08/2014  . Primary osteoarthritis of right knee 06/08/2014  . Screening for colon cancer   . Schatzki's ring   . Hiatal hernia   . Dysphagia, pharyngoesophageal phase 05/26/2014  . Encounter for screening colonoscopy 05/26/2014  . Adrenal adenoma 05/01/2014  . Breast lump 05/01/2014  . Metatarsalgia of both feet 01/12/2014  . Bilateral leg pain 01/11/2014  . Bilateral edema of lower extremity 01/11/2014  . Other osteoarthritis of spine, thoracolumbar region 12/26/2013  . Hypercholesteremia 05/19/2013  . Periodic health assessment, general screening, adult 04/13/2013  . GERD (gastroesophageal reflux disease) 04/13/2013  . HTN (hypertension) 04/13/2013  . Dyspnea 04/13/2013   PCP:  Esaw Grandchild, NP Pharmacy:   [place], [place] - [address] [place] [address] Phone: [phone] Fax: [phone]  [facility] [place] [place] - [address] [place] [address] Phone: [phone] Fax: [phone]     Social Determinants of Health (SDOH) Interventions    Readmission Risk Interventions No flowsheet data found.

## 2019-03-01 NOTE — Progress Notes (Signed)
Chaplain responded to Advanced Directive consult.  Upon visit, [name], noted that she did not need an AD. Chaplain let Ms. Fedele know about spiritual care services offered.  Chaplain will follow-up as needed.

## 2019-03-01 NOTE — Progress Notes (Signed)
  Echocardiogram 2D Echocardiogram has been performed.  Kariyah Baugh L Androw 03/01/2019, 8:57 AM

## 2019-03-05 LAB — CULTURE, BLOOD (ROUTINE X 2)
Culture: NO GROWTH
Culture: NO GROWTH
Special Requests: ADEQUATE

## 2019-04-12 NOTE — Progress Notes (Deleted)
Subjective:    Patient ID: [name], female    DOB: [birth year], 58 y.o.   MRN: [id number]  HPI:  [name] is here for hospital f/u: 02/27/2019 [name] a 58 y.o.femalewith medical history significant ofparaesophageal hernia; HTN; HLD; and remote post-op DVT presented to the hospital with shortness of breath for the last 2 to 3 weeks with dyspnea on exertion.  She stated that she could not lie flat because she was constantly coughing when she lied down.  Patient denies any heart or lung problems.She got a MRSA wound infection and it seems to have happened after this.  Patient denies any fever chest pain palpitation. She had a LLE abscess in early September requiring I&D and packing. She was treated with Bactrim ->Clindamycin ->Doxycycline.  In the ED,mild peripheral edema, BMP <150, no pulm edema on CT scan, no PE, and nothing that looks infectious. On lisinopril. HGB also down 2.5g in past 1 month, though hemoccult negative.   Dyspnea on exertion. Likely from anemia.  Patient stated that her symptoms began after recent wound infection with culture-proven MRSA.  Patient however does not have any fever, chills or rigor. She was not hypoxic despite complains of dyspnea on exertion and cough.  CT angiogram was performed without any pulmonary infiltrates or pulmonary embolism.  She did have a pulmonary nodule which will need outpatient follow-up.  EKG was within normal limits. Did have a minimally elevated BNP with mild lower extremity edema.  2D echocardiogram was performed which showed no valvular dysfunction, regurgitation vegetation and left-ventricular ejection fraction was completely normal.  Blood cultures was negative in 24 hours. Iron deficiency anemia..hemoglobin of 7.9 with mild renal sufficiency.  Iron profile reviewed low iron and ferritin..  Patient does have pica for ice.  Patient received 1 dose of Feraheme while in the hospital.  She will be given prescription of iron  sulfate on discharge.  Her hemoglobin needs to be monitored as outpatient.  Might benefit from evaluation with nephrology with her mild renal insufficiency.  Patient was Hemoccult negative. Cough especially on lying down.  Likely from severe GERD and history of hiatal hernia..  Patient states that she is on omeprazole.  Will change Protonix twice a day for now.  Prescription will be given on discharge. Essential hypertension -Continue HCTZ on discharge.- Lisinopril was stopped due to suspicion of ACE cough- Losartan was to be started in place of ACE HLD Recent LDL of 155 in 06/2018.  Discussing with primary care physician advised Morbid obesity -BMI 35.4.  This could be contributing to her dyspnea on exertion but patient thinks that this is something new.  She denies new weight gain. -Outpatient PCP/bariatric medicine/bariatric surgery f/u encouraged Stage 3 CKD -Likely due to long-standing HTN Chronic pain -Continue Cymbalta Disposition.  At this time, patient is stable for disposition home.  Patient was advised to follow-up with her primary care physician regarding her anemia and potential nephrology follow-up of her CKD and anemia.  Referral to GI, re: Hiatal Hernia  Patient Care Team    Relationship Specialty Notifications Start End  Mina Marble D, NP PCP - General Family Medicine  06/23/16   Daneil Dolin, MD Consulting Physician Gastroenterology  03/08/14   Leandrew Koyanagi, MD Attending Physician Orthopedic Surgery  06/23/16   Hurley Cisco, MD Consulting Physician Rheumatology  06/23/16     Patient Active Problem List   Diagnosis Date Noted  . Obesity (BMI 35.0-39.9 without comorbidity) 02/28/2019  . Normocytic  anemia, not due to blood loss 02/28/2019  . Restless legs syndrome (RLS) 06/30/2018  . Arthrosis of first carpometacarpal joint 02/11/2018  . Primary osteoarthritis of first carpometacarpal joint of left hand   . Pain in left hand 12/23/2017  . Healthcare maintenance  11/16/2017  . Tinea versicolor 11/16/2017  . Chronic right shoulder pain 09/02/2017  . Fibromyalgia 08/11/2017  . Family history of diabetes mellitus 06/23/2016  . Other fatigue 06/23/2016  . Insomnia 06/23/2016  . Rheumatoid arthritis ([place]) 06/23/2016  . Nontraumatic incomplete tear of right rotator cuff 04/28/2016  . Impingement syndrome of right shoulder 03/31/2016  . Status post right partial knee replacement 09/15/2014  . Acute medial meniscal injury of knee 06/14/2014  . Status post total knee replacement using cement 06/08/2014  . Primary osteoarthritis of right knee 06/08/2014  . Screening for colon cancer   . Schatzki's ring   . Hiatal hernia   . Dysphagia, pharyngoesophageal phase 05/26/2014  . Encounter for screening colonoscopy 05/26/2014  . Adrenal adenoma 05/01/2014  . Breast lump 05/01/2014  . Metatarsalgia of both feet 01/12/2014  . Bilateral leg pain 01/11/2014  . Bilateral edema of lower extremity 01/11/2014  . Other osteoarthritis of spine, thoracolumbar region 12/26/2013  . Hypercholesteremia 05/19/2013  . Periodic health assessment, general screening, adult 04/13/2013  . GERD (gastroesophageal reflux disease) 04/13/2013  . HTN (hypertension) 04/13/2013  . Dyspnea 04/13/2013     Past Medical History:  Diagnosis Date  . Acid reflux    takes Zantac and Omeprazole daily  . Anxiety    takes Citaopram daily  . Arthritis    right knee  . Chronic back pain    DDD  . Clotting disorder (H[facility])    hx of blood clot following knee scope  . History of bronchitis 3+yrs ago  . Hyperlipidemia    takes Atorvastatin daily  . Hypertension    takes Lisinopril and HCTZ daily  . Insomnia    takes Elavil nightly as needed  . Paraesophageal hernia      Past Surgical History:  Procedure Laterality Date  . BREAST BIOPSY Left   . BUNIONECTOMY Right 02/18/2018   Procedure: Yehuda Budd;  Surgeon: Edrick Kins, DPM;  Location: [facility];  Service: Podiatry;   Laterality: Right;  . CAPSULOTOMY Bilateral 02/18/2018   Procedure: CAPSULOTOMY MPJ RELEASE JOINT 2N BILATERAL;  Surgeon: Edrick Kins, DPM;  Location: [facility];  Service: Podiatry;  Laterality: Bilateral;  . CARPOMETACARPEL SUSPENSION PLASTY Left 01/27/2018   Procedure: LEFT THUMB ligament reconstruction and tendon interposition;  Surgeon: Leandrew Koyanagi, MD;  Location: [facility];  Service: Orthopedics;  Laterality: Left;  . CHONDROPLASTY Right 06/28/2014   Procedure: CHONDROPLASTY;  Surgeon: Marianna Payment, MD;  Location: [facility];  Service: Orthopedics;  Laterality: Right;  . COLONOSCOPY N/A 05/29/2014   Procedure: COLONOSCOPY;  Surgeon: Daneil Dolin, MD;  [facility];  Service: Endoscopy;  Laterality: N/A;  215pm- Pt is working until 12:00 so she can't come any earlier  . ESOPHAGOGASTRODUODENOSCOPY N/A 05/29/2014   Procedure: ESOPHAGOGASTRODUODENOSCOPY (EGD);  Surgeon: Daneil Dolin, MD;  [facility];  Service: Endoscopy;  Laterality: N/A;  . GANGLION CYST EXCISION Left 01/13/2002  . HAMMER TOE SURGERY Bilateral 02/18/2018   Procedure: HAMMER TOE CORRECTION2ND BILATERAL;  Surgeon: Edrick Kins, DPM;  Location: [facility];  Service: Podiatry;  Laterality: Bilateral;  . KNEE ARTHROSCOPY WITH MEDIAL MENISECTOMY Right 06/28/2014   Procedure: RIGHT KNEE ARTHROSCOPY WITH  PARTIAL MEDIAL MENISCECTOMY AND CHONDROPLASTY;  Surgeon: Marianna Payment, MD;  Location: [facility];  Service: Orthopedics;  Laterality: Right;  . MALONEY DILATION N/A 05/29/2014   Procedure: Venia Minks DILATION;  Surgeon: Daneil Dolin, MD;  [facility];  Service: Endoscopy;  Laterality: N/A;  . PARTIAL KNEE ARTHROPLASTY Right 09/15/2014   Procedure: RIGHT UNICOMPARTMENTAL KNEE ARTHROPLASTY;  Surgeon: Leandrew Koyanagi, MD;  Location: [facility];  Service: Orthopedics;  Laterality: Right;  . TOTAL KNEE ARTHROPLASTY Left 04/08/2004     Family History  Problem  Relation Age of Onset  . Cancer Father        lung  . Cancer Sister        breast  . Breast cancer Sister 93  . Cancer Maternal Grandfather        lung  . Cancer Paternal Grandfather        lung  . Diabetes Son   . Colon cancer Neg Hx      Social History   Substance and Sexual Activity  Drug Use No     Social History   Substance and Sexual Activity  Alcohol Use No  . Alcohol/week: 0.0 standard drinks     Social History   Tobacco Use  Smoking Status Never Smoker  Smokeless Tobacco Never Used     Outpatient Encounter Medications as of 04/13/2019  Medication Sig  . DULoxetine (CYMBALTA) 60 MG capsule Take 1 capsule (60 mg total) by mouth daily.  . ferrous sulfate 325 (65 FE) MG EC tablet Take 1 tablet (325 mg total) by mouth 2 (two) times daily.  . hydrochlorothiazide (HYDRODIURIL) 25 MG tablet Take 1 tablet (25 mg total) by mouth daily.  Marland Kitchen HYDROcodone-acetaminophen (NORCO) 5-325 MG tablet Take 1 tablet by mouth every 6 (six) hours as needed. (Patient taking differently: Take 1 tablet by mouth every 6 (six) hours as needed for moderate pain. )  . pantoprazole (PROTONIX) 40 MG tablet Take 1 tablet (40 mg total) by mouth daily.  . vitamin C (ASCORBIC ACID) 250 MG tablet Take 1 tablet (250 mg total) by mouth daily.   No facility-administered encounter medications on file as of 04/13/2019.     Allergies: Tramadol  There is no height or weight on file to calculate BMI.  Last menstrual period 02/07/2013.    Review of Systems     Objective:   Physical Exam        Assessment & Plan:  No diagnosis found.  No problem-specific Assessment & Plan notes found for this encounter.    FOLLOW-UP:  No follow-ups on file.

## 2019-04-13 ENCOUNTER — Ambulatory Visit: Payer: Self-pay | Admitting: Adult Health

## 2019-07-08 ENCOUNTER — Other Ambulatory Visit: Payer: Self-pay | Admitting: Adult Health

## 2019-07-12 ENCOUNTER — Other Ambulatory Visit: Payer: Self-pay | Admitting: Adult Health

## 2019-07-27 ENCOUNTER — Telehealth: Payer: Self-pay | Admitting: Adult Health

## 2019-07-27 NOTE — Telephone Encounter (Signed)
Thanks for the update on the recent ED visit and the fact that this med was d/ced then.   Needs OV and eval prior to any med to be refilled esp since D/Ced by physician!

## 2019-07-27 NOTE — Telephone Encounter (Signed)
Patient is aware of the below and verbalized understanding. She was not happy. I asked FD rep Dorothea Ogle to add patient to cancellation list. Patient is aware of this. AS, CMA

## 2019-07-27 NOTE — Telephone Encounter (Signed)
Patient was last seen in our office 01/11/19 by Valetta Fuller and advised to follow up in 64m.   Patient was then seen in ED 10/20 and Lisinopril was D/Ced by Dr, Karmen Bongo.    Patient is requesting refill of this medication. Please advise. AS, CMA

## 2019-07-27 NOTE — Telephone Encounter (Signed)
lisinopril (ZESTRIL) 40 MG tablet [id number]  DISCONTINUED   Order Details Dose: 40 mg Route: Oral Frequency: Daily  Dispense Quantity: 90 tablet Refills: 0        Sig: Take 1 tablet (40 mg total) by mouth daily.       Start Date: 01/11/19 End Date: 02/28/19  Discontinued by: Karmen Bongo, MD on 02/28/2019 08:28       Written Date: 01/11/19 Expiration Date: 01/11/20     Diagnosis Association: Essential hypertension (I10)  Original Order:  lisinopril (PRINIVIL,ZESTRIL) 40 MG tablet [id number]  Providers  Authorizing Provider: Esaw Grandchild, NP NPI: [id number]   DEA #[id number]  Ordering User:  Esaw Grandchild, NP      Pharmacy  [place], [place] - [place]  [address] [place], [place] [address]  Phone:  [phone]  Fax:  [phone]  DEA #:  --

## 2019-07-27 NOTE — Telephone Encounter (Signed)
Attempted to call patient with no answer. Left message for patient to call back. AS, CMA

## 2019-07-27 NOTE — Telephone Encounter (Signed)
Patient is requesting a refill of her lisinopril, she is due for an OV but is hoping we can call in some level of supply for her to hold her over until her 08/22/19 visit. If approved please send to [facility] on [address]

## 2019-08-22 ENCOUNTER — Other Ambulatory Visit: Payer: Self-pay

## 2019-08-22 ENCOUNTER — Ambulatory Visit (INDEPENDENT_AMBULATORY_CARE_PROVIDER_SITE_OTHER): Payer: Self-pay | Admitting: Family Medicine

## 2019-08-22 ENCOUNTER — Encounter: Payer: Self-pay | Admitting: Family Medicine

## 2019-08-22 VITALS — BP 163/91 | HR 83 | Temp 97.9°F | Resp 14 | Ht 63.5 in | Wt 225.8 lb

## 2019-08-22 DIAGNOSIS — E538 Deficiency of other specified B group vitamins: Secondary | ICD-10-CM

## 2019-08-22 DIAGNOSIS — R7303 Prediabetes: Secondary | ICD-10-CM

## 2019-08-22 DIAGNOSIS — I10 Essential (primary) hypertension: Secondary | ICD-10-CM

## 2019-08-22 DIAGNOSIS — M2550 Pain in unspecified joint: Secondary | ICD-10-CM

## 2019-08-22 DIAGNOSIS — G8929 Other chronic pain: Secondary | ICD-10-CM | POA: Insufficient documentation

## 2019-08-22 DIAGNOSIS — G2581 Restless legs syndrome: Secondary | ICD-10-CM

## 2019-08-22 DIAGNOSIS — F419 Anxiety disorder, unspecified: Secondary | ICD-10-CM | POA: Insufficient documentation

## 2019-08-22 DIAGNOSIS — R5383 Other fatigue: Secondary | ICD-10-CM

## 2019-08-22 DIAGNOSIS — D509 Iron deficiency anemia, unspecified: Secondary | ICD-10-CM

## 2019-08-22 DIAGNOSIS — E669 Obesity, unspecified: Secondary | ICD-10-CM

## 2019-08-22 DIAGNOSIS — K219 Gastro-esophageal reflux disease without esophagitis: Secondary | ICD-10-CM

## 2019-08-22 DIAGNOSIS — M069 Rheumatoid arthritis, unspecified: Secondary | ICD-10-CM

## 2019-08-22 DIAGNOSIS — N183 Chronic kidney disease, stage 3 unspecified: Secondary | ICD-10-CM | POA: Insufficient documentation

## 2019-08-22 DIAGNOSIS — F4323 Adjustment disorder with mixed anxiety and depressed mood: Secondary | ICD-10-CM

## 2019-08-22 MED ORDER — VITAMIN B12 500 MCG PO TABS: 1000.0000 ug | ORAL_TABLET | Freq: Every day | ORAL | Status: DC

## 2019-08-22 MED ORDER — FERROUS SULFATE 325 (65 FE) MG PO TBEC: 325.0000 mg | DELAYED_RELEASE_TABLET | Freq: Two times a day (BID) | ORAL | 0 refills | Status: DC

## 2019-08-22 MED ORDER — LISINOPRIL 40 MG PO TABS: 40.0000 mg | ORAL_TABLET | Freq: Every day | ORAL | 0 refills | Status: DC

## 2019-08-22 MED ORDER — DULOXETINE HCL 30 MG PO CPEP: 90.0000 mg | ORAL_CAPSULE | Freq: Every day | ORAL | 0 refills | Status: DC

## 2019-08-22 MED ORDER — OMEPRAZOLE 20 MG PO CPDR: 20.0000 mg | DELAYED_RELEASE_CAPSULE | Freq: Every day | ORAL | 0 refills | Status: DC

## 2019-08-22 MED ORDER — HYDROCHLOROTHIAZIDE 25 MG PO TABS: 25.0000 mg | ORAL_TABLET | Freq: Every day | ORAL | 0 refills | Status: DC

## 2019-08-22 NOTE — Patient Instructions (Addendum)
Check your blood pressure at least every other day or daily.  Please write these down as well as your heart rates. -Again please continue to take your iron every day as listed on your medication list. -You also need to take B12 supplements- see med list -You will get a MyChart message within 7 to 10 days about your lab results. -Also please make sure in addition to chronic follow-up you do have yearly physicals. -Please let us know if you like referral to GI since your reflux is poorly controlled and also, rheumatology for your fibromyalgia and rheumatoid arthritis etc.   Gastroesophageal Reflux Disease, Adult Normally, food travels down the esophagus and stays in the stomach to be digested. However, when a person has gastroesophageal reflux disease (GERD), food and stomach acid move back up into the esophagus. When this happens, the esophagus becomes sore and inflamed. Over time, GERD can create small holes (ulcers) in the lining of the esophagus. What are the causes? This condition is caused by a problem with the muscle between the esophagus and the stomach (lower esophageal sphincter, or LES). Normally, the LES muscle closes after food passes through the esophagus to the stomach. When the LES is weakened or abnormal, it does not close properly, and that allows food and stomach acid to go back up into the esophagus. The LES can be weakened by certain dietary substances, medicines, and medical conditions, including:  Tobacco use.  Pregnancy.  Having a hiatal hernia.  Heavy alcohol use.  Certain foods and beverages, such as coffee, chocolate, onions, and peppermint.  What increases the risk? This condition is more likely to develop in:  People who have an increased body weight.  People who have connective tissue disorders.  People who use NSAID medicines.  What are the signs or symptoms? Symptoms of this condition include:  Heartburn.  Difficult or painful swallowing.  The  feeling of having a lump in the throat.  A bitter taste in the mouth.  Bad breath.  Having a large amount of saliva.  Having an upset or bloated stomach.  Belching.  Chest pain.  Shortness of breath or wheezing.  Ongoing (chronic) cough or a night-time cough.  Wearing away of tooth enamel.  Weight loss.  Different conditions can cause chest pain. Make sure to see your health care provider if you experience chest pain. How is this diagnosed? Your health care provider will take a medical history and perform a physical exam. To determine if you have mild or severe GERD, your health care provider may also monitor how you respond to treatment. You may also have other tests, including:  An endoscopy to examine your stomach and esophagus with a small camera.  A test that measures the acidity level in your esophagus.  A test that measures how much pressure is on your esophagus.  A barium swallow or modified barium swallow to show the shape, size, and functioning of your esophagus.  How is this treated? The goal of treatment is to help relieve your symptoms and to prevent complications. Treatment for this condition may vary depending on how severe your symptoms are. Your health care provider may recommend:  Changes to your diet.  Medicine.  Surgery.  Follow these instructions at home: Diet  Follow a diet as recommended by your health care provider. This may involve avoiding foods and drinks such as: ? Coffee and tea (with or without caffeine). ? Drinks that contain alcohol. ? Energy drinks and sports drinks. ? Carbonated  drinks or sodas. ? Chocolate and cocoa. ? Peppermint and mint flavorings. ? Garlic and onions. ? Horseradish. ? Spicy and acidic foods, including peppers, chili powder, curry powder, vinegar, hot sauces, and barbecue sauce. ? Citrus fruit juices and citrus fruits, such as oranges, lemons, and limes. ? Tomato-based foods, such as red sauce, chili,  salsa, and pizza with red sauce. ? Fried and fatty foods, such as donuts, french fries, potato chips, and high-fat dressings. ? High-fat meats, such as hot dogs and fatty cuts of red and white meats, such as rib eye steak, sausage, ham, and bacon. ? High-fat dairy items, such as whole milk, butter, and cream cheese.  Eat small, frequent meals instead of large meals.  Avoid drinking large amounts of liquid with your meals.  Avoid eating meals during the 2-3 hours before bedtime.  Avoid lying down right after you eat.  Do not exercise right after you eat. General instructions  Pay attention to any changes in your symptoms.  Take over-the-counter and prescription medicines only as told by your health care provider. Do not take aspirin, ibuprofen, or other NSAIDs unless your health care provider told you to do so.  Do not use any tobacco products, including cigarettes, chewing tobacco, and e-cigarettes. If you need help quitting, ask your health care provider.  Wear loose-fitting clothing. Do not wear anything tight around your waist that causes pressure on your abdomen.  Raise (elevate) the head of your bed 6 inches (15cm).  Try to reduce your stress, such as with yoga or meditation. If you need help reducing stress, ask your health care provider.  If you are overweight, reduce your weight to an amount that is healthy for you. Ask your health care provider for guidance about a safe weight loss goal.  Keep all follow-up visits as told by your health care provider. This is important. Contact a health care provider if:  You have new symptoms.  You have unexplained weight loss.  You have difficulty swallowing, or it hurts to swallow.  You have wheezing or a persistent cough.  Your symptoms do not improve with treatment.  You have a hoarse voice. Get help right away if:  You have pain in your arms, neck, jaw, teeth, or back.  You feel sweaty, dizzy, or light-headed.  You  have chest pain or shortness of breath.  You vomit and your vomit looks like blood or coffee grounds.  You faint.  Your stool is bloody or black.  You cannot swallow, drink, or eat. This information is not intended to replace advice given to you by your health care provider. Make sure you discuss any questions you have with your health care provider. Document Released: 02/05/2005 Document Revised: 09/26/2015 Document Reviewed: 08/23/2014 Elsevier Interactive Patient Education  2018 [employer] for Gastroesophageal Reflux Disease, Adult When you have gastroesophageal reflux disease (GERD), the foods you eat and your eating habits are very important. Choosing the right foods can help ease the discomfort of GERD. Consider working with a diet and nutrition specialist (dietitian) to help you make healthy food choices. What general guidelines should I follow? Eating plan  Choose healthy foods low in fat, such as fruits, vegetables, whole grains, low-fat dairy products, and lean meat, fish, and poultry.  Eat frequent, small meals instead of three large meals each day. Eat your meals slowly, in a relaxed setting. Avoid bending over or lying down until 2-3 hours after eating.  Limit high-fat foods  such as fatty meats or fried foods.  Limit your intake of oils, butter, and shortening to less than 8 teaspoons each day.  Avoid the following: ? Foods that cause symptoms. These may be different for different people. Keep a food diary to keep track of foods that cause symptoms. ? Alcohol. ? Drinking large amounts of liquid with meals. ? Eating meals during the 2-3 hours before bed.  Cook foods using methods other than frying. This may include baking, grilling, or broiling. Lifestyle   Maintain a healthy weight. Ask your health care provider what weight is healthy for you. If you need to lose weight, work with your health care provider to do so safely.  Exercise for at  least 30 minutes on 5 or more days each week, or as told by your health care provider.  Avoid wearing clothes that fit tightly around your waist and chest.  Do not use any products that contain nicotine or tobacco, such as cigarettes and e-cigarettes. If you need help quitting, ask your health care provider.  Sleep with the head of your bed raised. Use a wedge under the mattress or blocks under the bed frame to raise the head of the bed. What foods are not recommended? The items listed may not be a complete list. Talk with your dietitian about what dietary choices are best for you. Grains Pastries or quick breads with added fat. [place] toast. Vegetables Deep fried vegetables. [place] fries. Any vegetables prepared with added fat. Any vegetables that cause symptoms. For some people this may include tomatoes and tomato products, chili peppers, onions and garlic, and horseradish. Fruits Any fruits prepared with added fat. Any fruits that cause symptoms. For some people this may include citrus fruits, such as oranges, grapefruit, pineapple, and lemons. Meats and other protein foods High-fat meats, such as fatty beef or pork, hot dogs, ribs, ham, sausage, salami and bacon. Fried meat or protein, including fried fish and fried chicken. Nuts and nut butters. Dairy Whole milk and chocolate milk. Sour cream. Cream. Ice cream. Cream cheese. Milk shakes. Beverages Coffee and tea, with or without caffeine. Carbonated beverages. Sodas. Energy drinks. Fruit juice made with acidic fruits (such as orange or grapefruit). Tomato juice. Alcoholic drinks. Fats and oils Butter. Margarine. Shortening. Ghee. Sweets and desserts Chocolate and cocoa. Donuts. Seasoning and other foods Pepper. Peppermint and spearmint. Any condiments, herbs, or seasonings that cause symptoms. For some people, this may include curry, hot sauce, or vinegar-based salad dressings. Summary  When you have gastroesophageal reflux disease  (GERD), food and lifestyle choices are very important to help ease the discomfort of GERD.  Eat frequent, small meals instead of three large meals each day. Eat your meals slowly, in a relaxed setting. Avoid bending over or lying down until 2-3 hours after eating.  Limit high-fat foods such as fatty meat or fried foods. This information is not intended to replace advice given to you by your health care provider. Make sure you discuss any questions you have with your health care provider. Document Released: 04/28/2005 Document Revised: 04/29/2016 Document Reviewed: 04/29/2016 Elsevier Interactive Patient Education  2018 [employer].    Your goal blood pressure should be around 135/85 or less on a regular basis.    However, if you are 28 years old or greater, then higher blood pressures are often tolerated based on your medical conditions.  Please follow the recommendations discussed with you by your primary care provider and/or cardiologist.  Hypertension Hypertension, commonly called high blood pressure, is when the force of blood pumping through the arteries is too strong. The arteries are the blood vessels that carry blood from the heart throughout the body. Hypertension forces the heart to work harder to pump blood and may cause arteries to become narrow or stiff. Having untreated or uncontrolled hypertension can cause heart attacks, strokes, kidney disease, and other problems. A blood pressure reading consists of a higher number over a lower number. Ideally, your blood pressure should be below 120/80. The first ("top") number is called the systolic pressure. It is a measure of the pressure in your arteries as your heart beats. The second ("bottom") number is called the diastolic pressure. It is a measure of the pressure in your arteries as the heart relaxes. What are the causes? The cause of this condition is not known. What increases the risk? Some risk factors for high blood  pressure are under your control. Others are not. Factors you can change  Smoking.  Having type 2 diabetes mellitus, high cholesterol, or both.  Not getting enough exercise or physical activity.  Being overweight.  Having too much fat, sugar, calories, or salt (sodium) in your diet.  Drinking too much alcohol. Factors that are difficult or impossible to change  Having chronic kidney disease.  Having a family history of high blood pressure.  Age. Risk increases with age.  Race. You may be at higher risk if you are African-American.  Gender. Men are at higher risk than women before age 51. After age 27, women are at higher risk than men.  Having obstructive sleep apnea.  Stress. What are the signs or symptoms? Extremely high blood pressure (hypertensive crisis) may cause:  Headache.  Anxiety.  Shortness of breath.  Nosebleed.  Nausea and vomiting.  Severe chest pain.  Jerky movements you cannot control (seizures).  How is this diagnosed? This condition is diagnosed by measuring your blood pressure while you are seated, with your arm resting on a surface. The cuff of the blood pressure monitor will be placed directly against the skin of your upper arm at the level of your heart. It should be measured at least twice using the same arm. Certain conditions can cause a difference in blood pressure between your right and left arms. Certain factors can cause blood pressure readings to be lower or higher than normal (elevated) for a short period of time:  When your blood pressure is higher when you are in a health care provider's office than when you are at home, this is called white coat hypertension. Most people with this condition do not need medicines.  When your blood pressure is higher at home than when you are in a health care provider's office, this is called masked hypertension. Most people with this condition may need medicines to control blood pressure.  If you  have a high blood pressure reading during one visit or you have normal blood pressure with other risk factors:  You may be asked to return on a different day to have your blood pressure checked again.  You may be asked to monitor your blood pressure at home for 1 week or longer.  If you are diagnosed with hypertension, you may have other blood or imaging tests to help your health care provider understand your overall risk for other conditions. How is this treated? This condition is treated by making healthy lifestyle changes, such as eating healthy foods, exercising more, and reducing your alcohol intake.  Your health care provider may prescribe medicine if lifestyle changes are not enough to get your blood pressure under control, and if:  Your systolic blood pressure is above 130.  Your diastolic blood pressure is above 80.  Your personal target blood pressure may vary depending on your medical conditions, your age, and other factors. Follow these instructions at home: Eating and drinking  Eat a diet that is high in fiber and potassium, and low in sodium, added sugar, and fat. An example eating plan is called the DASH (Dietary Approaches to Stop Hypertension) diet. To eat this way: ? Eat plenty of fresh fruits and vegetables. Try to fill half of your plate at each meal with fruits and vegetables. ? Eat whole grains, such as whole wheat pasta, brown rice, or whole grain bread. Fill about one quarter of your plate with whole grains. ? Eat or drink low-fat dairy products, such as skim milk or low-fat yogurt. ? Avoid fatty cuts of meat, processed or cured meats, and poultry with skin. Fill about one quarter of your plate with lean proteins, such as fish, chicken without skin, beans, eggs, and tofu. ? Avoid premade and processed foods. These tend to be higher in sodium, added sugar, and fat.  Reduce your daily sodium intake. Most people with hypertension should eat less than 1,500 mg of sodium a  day.  Limit alcohol intake to no more than 1 drink a day for nonpregnant women and 2 drinks a day for men. One drink equals 12 oz of beer, 5 oz of wine, or 1 oz of hard liquor. Lifestyle  Work with your health care provider to maintain a healthy body weight or to lose weight. Ask what an ideal weight is for you.  Get at least 30 minutes of exercise that causes your heart to beat faster (aerobic exercise) most days of the week. Activities may include walking, swimming, or biking.  Include exercise to strengthen your muscles (resistance exercise), such as pilates or lifting weights, as part of your weekly exercise routine. Try to do these types of exercises for 30 minutes at least 3 days a week.  Do not use any products that contain nicotine or tobacco, such as cigarettes and e-cigarettes. If you need help quitting, ask your health care provider.  Monitor your blood pressure at home as told by your health care provider.  Keep all follow-up visits as told by your health care provider. This is important. Medicines  Take over-the-counter and prescription medicines only as told by your health care provider. Follow directions carefully. Blood pressure medicines must be taken as prescribed.  Do not skip doses of blood pressure medicine. Doing this puts you at risk for problems and can make the medicine less effective.  Ask your health care provider about side effects or reactions to medicines that you should watch for. Contact a health care provider if:  You think you are having a reaction to a medicine you are taking.  You have headaches that keep coming back (recurring).  You feel dizzy.  You have swelling in your ankles.  You have trouble with your vision. Get help right away if:  You develop a severe headache or confusion.  You have unusual weakness or numbness.  You feel faint.  You have severe pain in your chest or abdomen.  You vomit repeatedly.  You have trouble  breathing. Summary  Hypertension is when the force of blood pumping through your arteries is too strong. If this condition is  not controlled, it may put you at risk for serious complications.  Your personal target blood pressure may vary depending on your medical conditions, your age, and other factors. For most people, a normal blood pressure is less than 120/80.  Hypertension is treated with lifestyle changes, medicines, or a combination of both. Lifestyle changes include weight loss, eating a healthy, low-sodium diet, exercising more, and limiting alcohol. This information is not intended to replace advice given to you by your health care provider. Make sure you discuss any questions you have with your health care provider. Document Released: 04/28/2005 Document Revised: 03/26/2016 Document Reviewed: 03/26/2016 Elsevier Interactive Patient Education  2018 [employer].    How to Take Your Blood Pressure   Blood pressure is a measurement of how strongly your blood is pressing against the walls of your arteries. Arteries are blood vessels that carry blood from your heart throughout your body. Your health care provider takes your blood pressure at each office visit. You can also take your own blood pressure at home with a blood pressure machine. You may need to take your own blood pressure:  To confirm a diagnosis of high blood pressure (hypertension).  To monitor your blood pressure over time.  To make sure your blood pressure medicine is working.  Supplies needed: To take your blood pressure, you will need a blood pressure machine. You can buy a blood pressure machine, or blood pressure monitor, at most drugstores or online. There are several types of home blood pressure monitors. When choosing one, consider the following:  Choose a monitor that has an arm cuff.  Choose a monitor that wraps snugly around your upper arm. You should be able to fit only one finger between your arm and  the cuff.  Do not choose a monitor that measures your blood pressure from your wrist or finger.  Your health care provider can suggest a reliable monitor that will meet your needs. How to prepare To get the most accurate reading, avoid the following for 30 minutes before you check your blood pressure:  Drinking caffeine.  Drinking alcohol.  Eating.  Smoking.  Exercising.  Five minutes before you check your blood pressure:  Empty your bladder.  Sit quietly without talking in a dining chair, rather than in a soft couch or armchair.  How to take your blood pressure To check your blood pressure, follow the instructions in the manual that came with your blood pressure monitor. If you have a digital blood pressure monitor, the instructions may be as follows: 1. Sit up straight. 2. Place your feet on the floor. Do not cross your ankles or legs. 3. Rest your left arm at the level of your heart on a table or desk or on the arm of a chair. 4. Pull up your shirt sleeve. 5. Wrap the blood pressure cuff around the upper part of your left arm, 1 inch (2.5 cm) above your elbow. It is best to wrap the cuff around bare skin. 6. Fit the cuff snugly around your arm. You should be able to place only one finger between the cuff and your arm. 7. Position the cord inside the groove of your elbow. 8. Press the power button. 9. Sit quietly while the cuff inflates and deflates. 10. Read the digital reading on the monitor screen and write it down (record it). 11. Wait 2-3 minutes, then repeat the steps, starting at step 1.  What does my blood pressure reading mean? A blood pressure reading consists  of a higher number over a lower number. Ideally, your blood pressure should be below 120/80. The first ("top") number is called the systolic pressure. It is a measure of the pressure in your arteries as your heart beats. The second ("bottom") number is called the diastolic pressure. It is a measure of the  pressure in your arteries as the heart relaxes. Blood pressure is classified into four stages. The following are the stages for adults who do not have a short-term serious illness or a chronic condition. Systolic pressure and diastolic pressure are measured in a unit called mm Hg. Normal  Systolic pressure: below 123456.  Diastolic pressure: below 80. Elevated  Systolic pressure: [id number].  Diastolic pressure: below 80. Hypertension stage 1  Systolic pressure: 0000000.  Diastolic pressure: [id number]. Hypertension stage 2  Systolic pressure: [id number] or above.  Diastolic pressure: 90 or above. You can have prehypertension or hypertension even if only the systolic or only the diastolic number in your reading is higher than normal. Follow these instructions at home:  Check your blood pressure as often as recommended by your health care provider.  Take your monitor to the next appointment with your health care provider to make sure: ? That you are using it correctly. ? That it provides accurate readings.  Be sure you understand what your goal blood pressure numbers are.  Tell your health care provider if you are having any side effects from blood pressure medicine. Contact a health care provider if:  Your blood pressure is consistently high. Get help right away if:  Your systolic blood pressure is higher than 180.  Your diastolic blood pressure is higher than 110. This information is not intended to replace advice given to you by your health care provider. Make sure you discuss any questions you have with your health care provider. Document Released: 10/05/2015 Document Revised: 12/18/2015 Document Reviewed: 10/05/2015 Elsevier Interactive Patient Education  [employer].

## 2019-08-22 NOTE — Progress Notes (Signed)
Impression and Recommendations:    1. Rheumatoid arthritis involving multiple sites, unspecified whether rheumatoid factor present ([place])   2. Chronic joint pain   3. Adjustment disorder with mixed anxiety and depressed mood   4. Essential hypertension   5. Gastroesophageal reflux disease, unspecified whether esophagitis present   6. Iron deficiency anemia, unspecified iron deficiency anemia type   7. B12 deficiency   8. Restless legs syndrome (RLS)   9. Obesity (BMI 35.0-39.9 without comorbidity)   10. Stage 3 chronic kidney disease, unspecified whether stage 3a or 3b CKD   11. Other fatigue   12. Prediabetes    Of note, this is my first time meeting patient.  Patient is new to me and was previously being cared for at our office by an NP, who no longer works at [facility].   Will be seen by Lorrene Reid, PA-C in future.   - Lab work updated today.  See orders.  Essential Hypertension - BP elevated on intake today at 173/97. - BP on re-check 163/91-  Still poorly controlled.  Discussed with patient importance of not missing any of her doses of her blood pressure medicines.  Discussed that her blood pressure should be consistently running around 135/85 or less, and if not, she should contact the office sooner than planned  - Per patient, has been out of lisinopril HCTZ for three weeks.  - Blood pressure currently is elevated. - Patient will resume treatment regimen as prescribed.  See med list.  - Counseled patient on pathophysiology of disease and discussed various treatment options, which always includes dietary and lifestyle modification as first line.   - Lifestyle changes such as dash and heart healthy diets and engaging in a regular exercise program discussed extensively with patient.   - Ambulatory blood pressure monitoring encouraged at least 3 times weekly.  Keep log and bring in every office visit.  Reminded patient that if they ever feel poorly in  any way, to check their blood pressure and pulse.  - We will continue to monitor.  - Because patient's BP is so high today and there has been a lapse in treatment, advised patient to check her BP daily at home and write down her top number, bottom number, and heart rate.  - Return for evaluation of blood pressure log in two months.   Hydradenitis Suppurativa - Education provided to patient today regarding dx occurrence and prevention.  All questions answered.  - Whenever patient begins feeling a pimple develop, advised patient to place a warm compress on the area and avoid shaving of the skin or any trauma to the skin.  - Advised patient to avoid shaving in areas of complaint. - Advised patient to avoid friction of clothing against the area.  - Advised patient of critical importance of proper hydration. - Encouraged patient to walk daily to encourage circulation and wound healing in her body.  - Will continue to monitor.   Rheumatoid Arthritis, Chronic Joint Pain - Managed on Cymbalta. - Dosage increased to 90 per day from 60 prior.  See med list.  - If desired, advised patient to incorporate tumeric into her diet for help with alleviating inflammation.  - STRONGLY encouraged patient to hydrate adequately and walk every day, starting slow.  - Per patient, diagnosed in the past, but has not been to follow-up with rheumatology due to lack of insurance.  When patient obtains access to insurance, advised her to call clinic to  request referral to rheumatology.  - Will continue to monitor.   Gastroesophageal reflux disease, unspecified whether esophagitis present - Per patient, is currently managed on omeprazole 20 mg twice daily. - Per patient, symptoms are currently worsening, and Protonix did not control her symptoms as well as omeprazole.  - Continue management as established.  See med list.  - STRONGLY advised patient to see a gastroenterologist in the very near future since  her symptoms on the 20 mg twice daily are not well controlled.  - Will continue to monitor.    Iron Deficiency Anemia - Patient's last hemoglobin was 8.0, 5 months ago.  Counseling done - Re-check ordered today.  See orders.  - Reviewed patient's history of iron-deficiency anemia during appointment today. - Discussed critical importance of taking iron supplementation exactly as prescribed.   - Patient will take iron daily.  See med list.  - Will continue to monitor.   Adjustment disorder with mixed anxiety and depressed mood - Per patient, overall stable at this time. - Continue management as established. - Patient knows to ask any time for further assistance if desired. - Will continue to monitor.   Prediabetes - Last A1c was 5.8 seven months ago. - Re-check ordered today.  See orders.  - Will continue to monitor.   B12 Deficiency - Patient will begin OTC B12. - Will continue to monitor.   BMI Counseling - Body mass index is 39.37 kg/m, Morbid Obesity Explained to patient what BMI refers to, and what it means medically.    Told patient to think about it as a "medical risk stratification measurement" and how increasing BMI is associated with increasing risk/ or worsening state of various diseases such as hypertension, hyperlipidemia, diabetes, premature OA, depression etc.  American Heart Association guidelines for healthy diet, basically Mediterranean diet, and exercise guidelines of 30 minutes 5 days per week or more discussed in detail.  Health counseling performed.  All questions answered.   Health Counseling & Preventative Maintenance - Advised patient to continue working toward exercising to improve overall mental, physical, and emotional health.    - Reviewed the "spokes of the wheel" of mood and health management.  Stressed the importance of ongoing prudent habits, including regular exercise, appropriate sleep hygiene, healthful dietary habits, and  prayer/meditation to relax.  - Encouraged patient to engage in daily physical activity as tolerated, especially a formal exercise routine.  Recommended that the patient eventually strive for at least 150 minutes of moderate cardiovascular activity per week according to guidelines established by the [facility].   - Healthy dietary habits encouraged, including low-carb, and high amounts of lean protein in diet.   - Patient should also consume adequate amounts of water.   Orders Placed This Encounter  Procedures   Vitamin B12   TSH   T4, free   CBC with Differential/Platelet   Basic metabolic panel   VITAMIN D 25 Hydroxy (Vit-D Deficiency, Fractures)   Hemoglobin A1c    Meds ordered this encounter  Medications   Cyanocobalamin (VITAMIN B-12) 500 MCG TABS    Sig: Take 1,000 mcg by mouth daily.   DULoxetine (CYMBALTA) 30 MG capsule    Sig: Take 3 capsules (90 mg total) by mouth daily.    Dispense:  270 capsule    Refill:  0   ferrous sulfate 325 (65 FE) MG EC tablet    Sig: Take 1 tablet (325 mg total) by mouth 2 (two) times daily.    Dispense:  120 tablet  Refill:  0   hydrochlorothiazide (HYDRODIURIL) 25 MG tablet    Sig: Take 1 tablet (25 mg total) by mouth daily.    Dispense:  90 tablet    Refill:  0   omeprazole (PRILOSEC) 20 MG capsule    Sig: Take 1 capsule (20 mg total) by mouth daily.    Dispense:  90 capsule    Refill:  0   lisinopril (ZESTRIL) 40 MG tablet    Sig: Take 1 tablet (40 mg total) by mouth daily.    Dispense:  90 tablet    Refill:  0    Medications Discontinued During This Encounter  Medication Reason   ferrous sulfate 325 (65 FE) MG EC tablet Error   HYDROcodone-acetaminophen (NORCO) 5-325 MG tablet Error   DULoxetine (CYMBALTA) 60 MG capsule Dose change   hydrochlorothiazide (HYDRODIURIL) 25 MG tablet Reorder   lisinopril (ZESTRIL) 40 MG tablet Reorder   omeprazole (PRILOSEC) 20 MG capsule Reorder      Please see AVS handed out  to patient at the end of our visit for further patient instructions/ counseling done pertaining to today's office visit.   Return in 8 weeks (on 10/17/2019) for evaluation of BP, anemia, chronic conditions- inc cymbalta, added back FE and Bp meds.     Note:  This note was prepared with assistance of Dragon voice recognition software. Occasional wrong-word or sound-a-like substitutions may have occurred due to the inherent limitations of voice recognition software.   The [place] was signed into law in 2016 which includes the topic of electronic health records.  This provides immediate access to information in MyChart.  This includes consultation notes, operative notes, office notes, lab results and pathology reports.  If you have any questions about what you read please let us know at your next visit or call us at the office.  We are right here with you.   This case required medical decision making of at least moderate complexity.  This document serves as a record of services personally performed by Mellody Dance, DO. It was created on her behalf by Toni Amend, a trained medical scribe. The creation of this record is based on the scribe's personal observations and the provider's statements to them.    The above documentation from Toni Amend, medical scribe, has been reviewed by Marjory Sneddon, D.O.       --------------------------------------------------------------------------------------------------------------------------------------------------------------------------------------------------------------------------------------------    Subjective:     Phillips Odor, am serving as scribe for Dr.Makenleigh Crownover.   HPI: [name] is a 59 y.o. female who presents to [place] [facility] today for issues as discussed below.  She needs refills on her medications today.  "Everything is no more refills."  Notes she was supposed  to follow up a couple of months ago after a hospital visit, but couldn't find a ride to the office.  Says her SOB is what sent her to the hospital.  "When I lay down at night, I can't even breathe."  She was told that she was anemic in the hospital, and given an infusion.  - Rheumatoid Arthritis She takes Cymbalta for joint pain caused by arthritis.  Went to a rheumatologist in the past for assessment, and was diagnosed with rheumatoid arthritis about four years ago.  She has not been back to rheumatology since due to her lack of insurance.  - Hydradenitis Suppurativa Notes she had something on the back of her leg in the hospital, "it hurt  so bad and it was huge."  They lanced it, drained it, and packed it, and called her a week later to tell her that the antibiotics she was taking weren't going to work on the bacteria from her culture.  Notes she also had a boil on her armpit, which drained Saturday night and Sunday, but it's still there.  States she has also had one on her privates, and it went away.  She does not hydrate adequately.  - Mood Says her mood is usually depressed.  "I just feel that way every day."  Sometimes she randomly starts crying, notes even while she is doing her daily cross-stitching, which she enjoys.  Says she would never hurt herself, and does not have true suicidal ideations.  - GERD She was started on omeprazole for her GERD in the past.  Notes that her GERD is "bad, and it's getting worse."  In the hospital, indicates that she was also told she may have a hiatal hernia.  Regarding her GERD, says "It's so bad that I throw up when I eat."    She does not see a gastroenterologist, because she doesn't have insurance.  She is in the process of getting disability because of everything she's got going on health wise.  When she was in the hospital in [place], they told her to switch GERD medications, and gave her another prescription.  Says "I don't like it.  I  didn't refill it because I still had two more refills of my omeprazole."  On discussion, she was given Protonix, and didn't think it worked as well as the omeprazole.    She takes omeprazole 20 mg twice daily.  - Iron Deficiency Anemia Patient states "I eat three bags of ice per week."  She has had no regular iron supplementation since December.  Says she loves her vegetables, and eats a lot of broccoli and cauliflower.  When she takes the iron tablets daily, she feels better.  Notes her lab work improved in the past after taking iron.  HPI:  Hypertension:  Feels her blood pressure at home is "just perfect," and "good when I'm taking the meds."  Says "I know when my blood pressure's high."  She takes lisinopril and HCTZ because her bottom number was staying in the 100s, and she was swelling.  She's been managed on medication for over a year.  - Patient reports good compliance with medication and/or lifestyle modification when her prescriptions are filled.  She's been out of lisinopril for three weeks.  - Her denies acute concerns or problems related to treatment plan  - She denies new onset of: chest pain, exercise intolerance, shortness of breath, dizziness, visual changes, headache, lower extremity swelling or claudication.   Last 3 blood pressure readings in our office are as follows: BP Readings from Last 3 Encounters:  08/22/19 (!) 163/91  03/01/19 (!) 145/81  01/21/19 119/70   Filed Weights   08/22/19 1424  Weight: 225 lb 12.8 oz (102.4 kg)     Depression screen [facility] 2/9 08/22/2019 01/11/2019 06/30/2018  Decreased Interest 3 1 1   Down, Depressed, Hopeless 3 3 3   PHQ - 2 Score 6 4 4   Altered sleeping 3 3 3   Tired, decreased energy 3 3 3   Change in appetite 2 1 0  Feeling bad or failure about yourself  3 2 2   Trouble concentrating 0 0 0  Moving slowly or fidgety/restless 0 0 0  Suicidal thoughts 3 0 2  PHQ-9 Score  20 13 14   Difficult doing work/chores Very difficult  Extremely dIfficult -  Some recent data might be hidden   GAD 7 : Generalized Anxiety Score 08/22/2019 04/23/2016 04/16/2016  Nervous, Anxious, on Edge 3 1 1   Control/stop worrying 3 1 (No Data)  Worry too much - different things 3 1 (No Data)  Trouble relaxing 3 1 1   Restless 3 1 1   Easily annoyed or irritable 2 1 1   Afraid - awful might happen 1 0 0  Total GAD 7 Score 18 6 -  Anxiety Difficulty Very difficult - -      Wt Readings from Last 3 Encounters:  08/22/19 225 lb 12.8 oz (102.4 kg)  03/01/19 217 lb 3.2 oz (98.5 kg)  01/21/19 200 lb (90.7 kg)   BP Readings from Last 3 Encounters:  08/22/19 (!) 163/91  03/01/19 (!) 145/81  01/21/19 119/70   Pulse Readings from Last 3 Encounters:  08/22/19 83  03/01/19 80  01/21/19 78   BMI Readings from Last 3 Encounters:  08/22/19 39.37 kg/m  03/01/19 38.48 kg/m  01/21/19 34.87 kg/m     Patient Care Team    Relationship Specialty Notifications Start End  Mina Marble D, NP PCP - General Family Medicine  06/23/16   Daneil Dolin, MD Consulting Physician Gastroenterology  03/08/14   Leandrew Koyanagi, MD Attending Physician Orthopedic Surgery  06/23/16   Hurley Cisco, MD Consulting Physician Rheumatology  06/23/16      Patient Active Problem List   Diagnosis Date Noted   Adjustment disorder with mixed anxiety and depressed mood 08/22/2019   Iron deficiency anemia 08/22/2019   Gastroesophageal reflux disease 08/22/2019   Chronic joint pain 08/22/2019   Stage 3 chronic kidney disease 08/22/2019   Obesity (BMI 35.0-39.9 without comorbidity) 02/28/2019   Normocytic anemia, not due to blood loss 02/28/2019   Restless legs syndrome (RLS) 06/30/2018   Arthrosis of first carpometacarpal joint 02/11/2018   Primary osteoarthritis of first carpometacarpal joint of left hand    Pain in left hand 12/23/2017   Healthcare maintenance 11/16/2017   Tinea versicolor 11/16/2017   Chronic right shoulder pain 09/02/2017     Fibromyalgia 08/11/2017   Family history of diabetes mellitus 06/23/2016   Other fatigue 06/23/2016   Insomnia 06/23/2016   Rheumatoid arthritis ([place]) 06/23/2016   Nontraumatic incomplete tear of right rotator cuff 04/28/2016   Impingement syndrome of right shoulder 03/31/2016   Status post right partial knee replacement 09/15/2014   Acute medial meniscal injury of knee 06/14/2014   Status post total knee replacement using cement 06/08/2014   Primary osteoarthritis of right knee 06/08/2014   Screening for colon cancer    Schatzki's ring    Hiatal hernia    Dysphagia, pharyngoesophageal phase 05/26/2014   Encounter for screening colonoscopy 05/26/2014   Adrenal adenoma 05/01/2014   Breast lump 05/01/2014   Metatarsalgia of both feet 01/12/2014   Bilateral leg pain 01/11/2014   Bilateral edema of lower extremity 01/11/2014   Other osteoarthritis of spine, thoracolumbar region 12/26/2013   Hypercholesteremia 05/19/2013   Periodic health assessment, general screening, adult 04/13/2013   GERD (gastroesophageal reflux disease) 04/13/2013   Essential hypertension 04/13/2013   Dyspnea 04/13/2013    Past Medical history, Surgical history, Family history, Social history, Allergies and Medications have been entered into the medical record, reviewed and changed as needed.    Current Meds  Medication Sig   hydrochlorothiazide (HYDRODIURIL) 25 MG tablet Take 1 tablet (25 mg total) by  mouth daily.   lisinopril (ZESTRIL) 40 MG tablet Take 1 tablet (40 mg total) by mouth daily.   omeprazole (PRILOSEC) 20 MG capsule Take 1 capsule (20 mg total) by mouth daily.   vitamin C (ASCORBIC ACID) 250 MG tablet Take 1 tablet (250 mg total) by mouth daily.   [DISCONTINUED] DULoxetine (CYMBALTA) 60 MG capsule Take 1 capsule (60 mg total) by mouth daily.   [DISCONTINUED] hydrochlorothiazide (HYDRODIURIL) 25 MG tablet Take 1 tablet (25 mg total) by mouth daily.    [DISCONTINUED] lisinopril (ZESTRIL) 40 MG tablet Take 40 mg by mouth daily.   [DISCONTINUED] omeprazole (PRILOSEC) 20 MG capsule Take 20 mg by mouth daily.    Allergies:  Allergies  Allergen Reactions   Tramadol Itching    "bugs crawling all over"     Review of Systems:  A fourteen system review of systems was performed and found to be positive as per HPI.   Objective:   Blood pressure (!) 163/91, pulse 83, temperature 97.9 F (36.6 C), temperature source Oral, resp. rate 14, height 5' 3.5" (1.613 m), weight 225 lb 12.8 oz (102.4 kg), last menstrual period 02/07/2013, SpO2 100 %. Body mass index is 39.37 kg/m. General:  patient with pallor of eyelids as well slightly appreciated in oral cavity;  fingertips slightly with pallor as well.  Otherwise well Developed, well nourished, appropriate for stated age.  Neuro:  Alert and oriented,  extra-ocular muscles intact  HEENT:  Normocephalic, atraumatic, neck supple, no carotid bruits appreciated  Skin: subcutaneous tender erythematous papule under left axilla, approximately 2.5 cm diameter, non fluctuant, and no pustule present. No gross rash, warm, pink. Cardiac:  RRR, S1 S2 Respiratory:  ECTA B/L and A/P, Not using accessory muscles, speaking in full sentences- unlabored. Vascular:  Ext warm, no cyanosis apprec.; cap RF less 2 sec. Psych:  No HI/SI, judgement and insight good, Euthymic mood. Full Affect.

## 2019-08-23 LAB — BASIC METABOLIC PANEL
BUN/Creatinine Ratio: 12 (ref 9–23)
BUN: 12 mg/dL (ref 6–24)
CO2: 22 mmol/L (ref 20–29)
Calcium: 9.2 mg/dL (ref 8.7–10.2)
Chloride: 104 mmol/L (ref 96–106)
Creatinine, Ser: 1.02 mg/dL — ABNORMAL HIGH (ref 0.57–1.00)
GFR calc Af Amer: 71 mL/min/{1.73_m2} (ref >59)
GFR calc non Af Amer: 61 mL/min/{1.73_m2} (ref >59)
Glucose: 122 mg/dL — ABNORMAL HIGH (ref 65–99)
Potassium: 4.5 mmol/L (ref 3.5–5.2)
Sodium: 138 mmol/L (ref 134–144)

## 2019-08-23 LAB — CBC WITH DIFFERENTIAL/PLATELET
Basophils Absolute: 0.1 10*3/uL (ref 0.0–0.2)
Basos: 1 %
EOS (ABSOLUTE): 0.1 10*3/uL (ref 0.0–0.4)
Eos: 2 %
Hematocrit: 30 % — ABNORMAL LOW (ref 34.0–46.6)
Hemoglobin: 9.7 g/dL — ABNORMAL LOW (ref 11.1–15.9)
Immature Grans (Abs): 0 10*3/uL (ref 0.0–0.1)
Immature Granulocytes: 0 %
Lymphocytes Absolute: 2.5 10*3/uL (ref 0.7–3.1)
Lymphs: 35 %
MCH: 26.2 pg — ABNORMAL LOW (ref 26.6–33.0)
MCHC: 32.3 g/dL (ref 31.5–35.7)
MCV: 81 fL (ref 79–97)
Monocytes Absolute: 0.8 10*3/uL (ref 0.1–0.9)
Monocytes: 11 %
Neutrophils Absolute: 3.5 10*3/uL (ref 1.4–7.0)
Neutrophils: 51 %
Platelets: 458 10*3/uL — ABNORMAL HIGH (ref 150–450)
RBC: 3.7 x10E6/uL — ABNORMAL LOW (ref 3.77–5.28)
RDW: 16.5 % — ABNORMAL HIGH (ref 11.7–15.4)
WBC: 7 10*3/uL (ref 3.4–10.8)

## 2019-08-23 LAB — HEMOGLOBIN A1C
Est. average glucose Bld gHb Est-mCnc: 120 mg/dL
Hgb A1c MFr Bld: 5.8 % — ABNORMAL HIGH (ref 4.8–5.6)

## 2019-08-23 LAB — TSH: TSH: 1.72 u[IU]/mL (ref 0.450–4.500)

## 2019-08-23 LAB — T4, FREE: Free T4: 0.99 ng/dL (ref 0.82–1.77)

## 2019-08-23 LAB — VITAMIN D 25 HYDROXY (VIT D DEFICIENCY, FRACTURES): Vit D, 25-Hydroxy: 18.4 ng/mL — ABNORMAL LOW (ref 30.0–100.0)

## 2019-08-23 LAB — VITAMIN B12: Vitamin B-12: 195 pg/mL — ABNORMAL LOW (ref 232–1245)

## 2019-08-29 ENCOUNTER — Other Ambulatory Visit: Payer: Self-pay

## 2019-08-29 DIAGNOSIS — I10 Essential (primary) hypertension: Secondary | ICD-10-CM

## 2019-08-29 MED ORDER — OMEPRAZOLE 20 MG PO CPDR: 20.0000 mg | DELAYED_RELEASE_CAPSULE | Freq: Every day | ORAL | 0 refills | Status: DC

## 2019-08-29 MED ORDER — LISINOPRIL 40 MG PO TABS: 40.0000 mg | ORAL_TABLET | Freq: Every day | ORAL | 0 refills | Status: DC

## 2019-08-29 MED ORDER — HYDROCHLOROTHIAZIDE 25 MG PO TABS: 25.0000 mg | ORAL_TABLET | Freq: Every day | ORAL | 0 refills | Status: DC

## 2019-08-29 MED ORDER — DULOXETINE HCL 30 MG PO CPEP: 90.0000 mg | ORAL_CAPSULE | Freq: Every day | ORAL | 0 refills | Status: DC

## 2019-08-29 NOTE — Telephone Encounter (Signed)
Received faxes from [facility] requesting refills be transferred to their pharmacy.  [place] and confirmed that pt has not had these prescriptions filled at their pharmacy yet.  Advised pharmacist to cancel these prescriptions.  New RXs sent to [facility].  Charyl Bigger, CMA

## 2019-09-28 ENCOUNTER — Telehealth: Payer: Self-pay | Admitting: Physician Assistant

## 2019-09-28 NOTE — Telephone Encounter (Signed)
Patient called states she know has an appt 6/14 but is concerned her BP has been running 177/102 the lowest is 150/100 and she is having nightly headaches as well doesn't know if they are connected to the Elevated BP or not --  advised would forward message to med asst for review w/ provider and will call her back with instructions/ advice. ---- pt states is about to go to work but can be reached @ [phone].  --glh

## 2019-09-28 NOTE — Telephone Encounter (Signed)
Called patient who states she is having headaches and neck pain.   Patient denied SOB, Chest pain or tightness, jaw pain, L arm pain, Dizziness.  Advised patient due to increase BP and headache and neck pain sx that she should go to ED or UC for evaluation today. Patient is aware an increase BP could cause stroke and that she needs evaluation today. Patient verbalized understanding.   [name] is aware of this and is in agreement. AS, CMA

## 2019-10-24 ENCOUNTER — Ambulatory Visit: Payer: Self-pay | Admitting: Physician Assistant

## 2019-10-24 NOTE — Progress Notes (Deleted)
Established Patient Office Visit  Subjective:  Patient ID: [name], female    DOB: [birth year]  Age: 59 y.o. MRN: [id number]  CC: No chief complaint on file.   HPI [name] presents for follow-up on hypertension and anemia.   HTN: Pt denies chest pain, palpitations, dizziness or leg swelling. Taking medication as directed without side effects. Checks BP at home ***times/wk and readings range in ***. Pt follows a low salt diet.  Anemia: Reports taking iron supplement as directed without issues. Denies increased fatigue, melena or hematochezia.    Past Medical History:  Diagnosis Date  . Acid reflux    takes Zantac and Omeprazole daily  . Anxiety    takes Citaopram daily  . Arthritis    right knee  . Chronic back pain    DDD  . Clotting disorder (HCC)    hx of blood clot following knee scope  . History of bronchitis 3+yrs ago  . Hyperlipidemia    takes Atorvastatin daily  . Hypertension    takes Lisinopril and HCTZ daily  . Insomnia    takes Elavil nightly as needed  . Paraesophageal hernia     Past Surgical History:  Procedure Laterality Date  . BREAST BIOPSY Left   . BUNIONECTOMY Right 02/18/2018   Procedure: Yehuda Budd;  Surgeon: Edrick Kins, DPM;  Location: [place];  Service: Podiatry;  Laterality: Right;  . CAPSULOTOMY Bilateral 02/18/2018   Procedure: CAPSULOTOMY MPJ RELEASE JOINT 2N BILATERAL;  Surgeon: Edrick Kins, DPM;  Location: [place];  Service: Podiatry;  Laterality: Bilateral;  . CARPOMETACARPEL SUSPENSION PLASTY Left 01/27/2018   Procedure: LEFT THUMB ligament reconstruction and tendon interposition;  Surgeon: Leandrew Koyanagi, MD;  Location: [place];  Service: Orthopedics;  Laterality: Left;  . CHONDROPLASTY Right 06/28/2014   Procedure: CHONDROPLASTY;  Surgeon: Marianna Payment, MD;  Location: [place];  Service: Orthopedics;  Laterality: Right;  . COLONOSCOPY N/A 05/29/2014   Procedure: COLONOSCOPY;   Surgeon: Daneil Dolin, MD;  [facility];  Service: Endoscopy;  Laterality: N/A;  215pm- Pt is working until 12:00 so she can't come any earlier  . ESOPHAGOGASTRODUODENOSCOPY N/A 05/29/2014   Procedure: ESOPHAGOGASTRODUODENOSCOPY (EGD);  Surgeon: Daneil Dolin, MD;  [facility];  Service: Endoscopy;  Laterality: N/A;  . GANGLION CYST EXCISION Left 01/13/2002  . HAMMER TOE SURGERY Bilateral 02/18/2018   Procedure: HAMMER TOE CORRECTION2ND BILATERAL;  Surgeon: Edrick Kins, DPM;  Location: [place];  Service: Podiatry;  Laterality: Bilateral;  . KNEE ARTHROSCOPY WITH MEDIAL MENISECTOMY Right 06/28/2014   Procedure: RIGHT KNEE ARTHROSCOPY WITH PARTIAL MEDIAL MENISCECTOMY AND CHONDROPLASTY;  Surgeon: Marianna Payment, MD;  Location: [place];  Service: Orthopedics;  Laterality: Right;  . MALONEY DILATION N/A 05/29/2014   Procedure: Venia Minks DILATION;  Surgeon: Daneil Dolin, MD;  [facility];  Service: Endoscopy;  Laterality: N/A;  . PARTIAL KNEE ARTHROPLASTY Right 09/15/2014   Procedure: RIGHT UNICOMPARTMENTAL KNEE ARTHROPLASTY;  Surgeon: Leandrew Koyanagi, MD;  Location: [place];  Service: Orthopedics;  Laterality: Right;  . TOTAL KNEE ARTHROPLASTY Left 04/08/2004    Family History  Problem Relation Age of Onset  . Cancer Father        lung  . Cancer Sister        breast  . Breast cancer Sister 42  . Cancer Maternal Grandfather        lung  . Cancer  Paternal Grandfather        lung  . Diabetes Son   . Colon cancer Neg Hx     Social History   Socioeconomic History  . Marital status: Divorced    Spouse name: Not on file  . Number of children: Not on file  . Years of education: Not on file  . Highest education level: Not on file  Occupational History  . Occupation: works as Geneticist, molecular  . Smoking status: Never Smoker  . Smokeless tobacco: Never Used  Vaping Use  . Vaping Use: Never used  Substance and Sexual  Activity  . Alcohol use: No    Alcohol/week: 0.0 standard drinks  . Drug use: No  . Sexual activity: Not Currently    Birth control/protection: None  Other Topics Concern  . Not on file  Social History Narrative  . Not on file   Social Determinants of Health   Financial Resource Strain:   . Difficulty of Paying Living Expenses:   Food Insecurity:   . Worried About Charity fundraiser in the Last Year:   . Arboriculturist in the Last Year:   Transportation Needs:   . Film/video editor (Medical):   Marland Kitchen Lack of Transportation (Non-Medical):   Physical Activity:   . Days of Exercise per Week:   . Minutes of Exercise per Session:   Stress:   . Feeling of Stress :   Social Connections:   . Frequency of Communication with Friends and Family:   . Frequency of Social Gatherings with Friends and Family:   . Attends Religious Services:   . Active Member of Clubs or Organizations:   . Attends Archivist Meetings:   Marland Kitchen Marital Status:   Intimate Partner Violence:   . Fear of Current or Ex-Partner:   . Emotionally Abused:   Marland Kitchen Physically Abused:   . Sexually Abused:     Outpatient Medications Prior to Visit  Medication Sig Dispense Refill  . Cyanocobalamin (VITAMIN B-12) 500 MCG TABS Take 1,000 mcg by mouth daily.    . DULoxetine (CYMBALTA) 30 MG capsule Take 3 capsules (90 mg total) by mouth daily. 270 capsule 0  . ferrous sulfate 325 (65 FE) MG EC tablet Take 1 tablet (325 mg total) by mouth 2 (two) times daily. 120 tablet 0  . hydrochlorothiazide (HYDRODIURIL) 25 MG tablet Take 1 tablet (25 mg total) by mouth daily. 90 tablet 0  . lisinopril (ZESTRIL) 40 MG tablet Take 1 tablet (40 mg total) by mouth daily. 90 tablet 0  . omeprazole (PRILOSEC) 20 MG capsule Take 1 capsule (20 mg total) by mouth daily. 90 capsule 0  . pantoprazole (PROTONIX) 40 MG tablet Take 1 tablet (40 mg total) by mouth daily. 30 tablet 2  . vitamin C (ASCORBIC ACID) 250 MG tablet Take 1 tablet  (250 mg total) by mouth daily. 60 tablet 2   No facility-administered medications prior to visit.    Allergies  Allergen Reactions  . Tramadol Itching    "bugs crawling all over"    ROS Review of Systems    Objective:    Physical Exam  LMP 02/07/2013  Wt Readings from Last 3 Encounters:  08/22/19 225 lb 12.8 oz (102.4 kg)  03/01/19 217 lb 3.2 oz (98.5 kg)  01/21/19 200 lb (90.7 kg)     Health Maintenance Due  Topic Date Due  . Hepatitis C Screening  Never done  . COVID-19  Vaccine (1) Never done  . MAMMOGRAM  07/17/2019    There are no preventive care reminders to display for this patient.  Lab Results  Component Value Date   TSH 1.720 08/22/2019   Lab Results  Component Value Date   WBC 7.0 08/22/2019   HGB 9.7 (L) 08/22/2019   HCT 30.0 (L) 08/22/2019   MCV 81 08/22/2019   PLT 458 (H) 08/22/2019   Lab Results  Component Value Date   NA 138 08/22/2019   K 4.5 08/22/2019   CO2 22 08/22/2019   GLUCOSE 122 (H) 08/22/2019   BUN 12 08/22/2019   CREATININE 1.02 (H) 08/22/2019   BILITOT 0.4 01/19/2019   ALKPHOS 80 01/19/2019   AST 17 01/19/2019   ALT 15 01/19/2019   PROT 7.0 01/19/2019   ALBUMIN 3.4 (L) 01/19/2019   CALCIUM 9.2 08/22/2019   ANIONGAP 8 03/01/2019   Lab Results  Component Value Date   CHOL 236 (H) 06/29/2018   Lab Results  Component Value Date   HDL 57 06/29/2018   Lab Results  Component Value Date   LDLCALC 155 (H) 06/29/2018   Lab Results  Component Value Date   TRIG 122 06/29/2018   Lab Results  Component Value Date   CHOLHDL 4.1 06/29/2018   Lab Results  Component Value Date   HGBA1C 5.8 (H) 08/22/2019      Assessment & Plan:   Problem List Items Addressed This Visit    None      No orders of the defined types were placed in this encounter.   Follow-up: No follow-ups on file.    Lorrene Reid, PA-C

## 2019-11-02 ENCOUNTER — Other Ambulatory Visit: Payer: Self-pay

## 2019-11-02 ENCOUNTER — Telehealth (INDEPENDENT_AMBULATORY_CARE_PROVIDER_SITE_OTHER): Payer: Self-pay | Admitting: Physician Assistant

## 2019-11-02 ENCOUNTER — Encounter: Payer: Self-pay | Admitting: Physician Assistant

## 2019-11-02 VITALS — BP 151/98 | HR 93 | Ht 63.5 in | Wt 225.0 lb

## 2019-11-02 DIAGNOSIS — I10 Essential (primary) hypertension: Secondary | ICD-10-CM

## 2019-11-02 DIAGNOSIS — F419 Anxiety disorder, unspecified: Secondary | ICD-10-CM

## 2019-11-02 DIAGNOSIS — D509 Iron deficiency anemia, unspecified: Secondary | ICD-10-CM

## 2019-11-02 DIAGNOSIS — E538 Deficiency of other specified B group vitamins: Secondary | ICD-10-CM

## 2019-11-02 DIAGNOSIS — M069 Rheumatoid arthritis, unspecified: Secondary | ICD-10-CM

## 2019-11-02 MED ORDER — FLUOXETINE HCL 20 MG PO TABS: ORAL_TABLET | ORAL | 1 refills | Status: DC

## 2019-11-02 MED ORDER — HYDROCHLOROTHIAZIDE 50 MG PO TABS: 50.0000 mg | ORAL_TABLET | Freq: Every day | ORAL | 1 refills | Status: DC

## 2019-11-02 NOTE — Assessment & Plan Note (Signed)
-   BP today is 151/98, above goal and pt reports ambulatory BP runs along the same so discussed medication adjustments. Increased HCTZ to 50 mg and advised cant take 2 tablets of 25 mg and will send new rx with new dose. Pt agreeable. Continue Lisinopril 40 mg. - Continue ambulatory BP and pulse monitoring and keep a log. - Stay well hydrated, at least 64 fl oz - Follow DASH diet. - Encourage to stay as active as possible. - Follow up in 6-8 weeks to reassess BP and medication therapy.

## 2019-11-02 NOTE — Progress Notes (Addendum)
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 Location of the patient: Home . Location of the provider: Office - This visit type was conducted due to national recommendations for restrictions regarding the COVID-19 Pandemic (e.g. social distancing) in an effort to limit this patient's exposure and mitigate transmission in our community.    - No physical exam could be performed with this format, beyond that communicated to [place] by the patient/ family members as noted.   - Additionally my office staff/ schedulers were to discuss with the patient that there may be a monetary charge related to this service, depending on their medical insurance.  My understanding is that patient understood and consented to proceed.     _________________________________________________________________________________   History of Present Illness: Pt calls in to follow-up on hypertension. Patient is still in the process of obtaining insurance and has recently submitted additional information needed.  HTN: Pt denies chest pain, palpitations, dizziness or lower extremity swelling. Taking medication as directed without side effects. Checks BP at home and readings range in 150s/90. Pt follows a low salt diet.  Anemia: Patient reports she is taking her iron supplement but continues to crave and chew ice.  States she also feels tired.  Denies melena or hematochezia.  Reports in the past she had to get IV iron infusions.  Anxiety: States she is not sleeping well.  Patient constantly worries and everything seems to bother her.  When she does not sleep good she then gets a dull headache.  Patient verbalizes interest on trying something to help her with her anxiety.  RA: Reports compliance with increased dose of Cymbalta 90 mg. States she has aches especially her feet  where sometimes she cannot even walk.    GAD 7 : Generalized Anxiety Score 11/02/2019 08/22/2019 04/23/2016 04/16/2016  Nervous, Anxious, on Edge 0 _0 Control/stop worrying _1 (No Data)  Worry too much - different things _2 (No Data)  Trouble relaxing 0 _3 Restless 0 _4 Easily annoyed or irritable _5 Afraid - awful might happen 0 1 0 0  Total GAD 7 Score _6 -  Anxiety Difficulty Not difficult at all Very difficult - -    Depression screen [facility] 2/9 11/02/2019 08/22/2019 01/11/2019 06/30/2018 11/16/2017  Decreased Interest _7 Down, Depressed, Hopeless _8 PHQ - 2 Score _9 Altered sleeping _10 Tired, decreased energy _11 Change in appetite 0 2 1 0 2  Feeling bad or failure about yourself  _12 Trouble concentrating 0 0 0 0 0  Moving slowly or fidgety/restless 0 0 0 0 0  Suicidal thoughts 0 3 0 2 0  PHQ-9 Score _13 Difficult doing work/chores Not difficult at all Very difficult Extremely dIfficult - Somewhat difficult  Some recent data might be hidden      Impression and Recommendations:     1. Essential hypertension   2. Rheumatoid arthritis involving multiple sites, unspecified whether rheumatoid factor present ([place])   3. Iron deficiency anemia, unspecified iron deficiency anemia type   4. Anxiety  5. B12 deficiency     Essential hypertension: -BP today is 151/98, which is consistent with patient's ambulatory BP monitoring so will increase HCTZ to 50 mg.  Continue lisinopril 40 mg. -Continue ambulatory BP and pulse monitoring. -Continue DASH diet and stay as active as possible. -Stay well-hydrated, at least 64 fluid ounces. -Follow-up in 6 to 8 weeks to reassess BP and medication therapy. -Placed future lab order for CMP to monitor medication therapy.  RA involving multiple sites: -Stable -Continue Cymbalta 90 mg. -Will refer to rheumatology once patient has obtained health insurance.  Iron  deficiency anemia: -Patient symptomatic with fatigue and pagophagia so placed future lab orders for CBC and iron panel. -Last CBC: Hemoglobin 9.7, hematocrit 30.0 -Continue ferrous sulfate 325 mg twice daily. Recommend to take iron supplement 2 hours before or 4 hours after taking omeprazole.  Anxiety: -PHQ-9 score of 13 and GAD-7 score of 7. -Discussed with patient various treatment options for anxiety and agreeable to starting fluoxetine. -Encouraged to incorporate nonpharmacological therapy such as relaxation techniques and using the calm app. -Follow-up in 6 to 8 weeks to reassess symptoms and medication therapy.  B12 deficiency: -Place future lab order to recheck vitamin B12. -Continue cyanocobalamin 1000 mcg.  - As part of my medical decision making, I reviewed the following data within the [place] History obtained from pt /family, CMA notes reviewed and incorporated if applicable, Labs reviewed, Radiograph/ tests reviewed if applicable and OV notes from prior OV's with me, as well as any other specialists she/he has seen since seeing me last, were all reviewed and used in my medical decision making process today.    - Additionally, when appropriate, discussion had with patient regarding our treatment plan, and their biases/concerns about that plan were used in my medical decision making today.    - The patient agreed with the plan and demonstrated an understanding of the instructions.   No barriers to understanding were identified.     - The patient was advised to call back or seek an in-person evaluation if the symptoms worsen or if the condition fails to improve as anticipated.   Return for HTN-inc med dose, Anxiety-started new med in 6-8 wks. Lab visit (cbc, vit b12, cmp, iron panel) 1-2w.    Orders Placed This Encounter  Procedures  . CBC  . Iron, TIBC and Ferritin Panel  . Comp Met (CMET)  . B12    Meds ordered this encounter  Medications  .  FLUoxetine (PROZAC) 20 MG tablet    Sig: Take 0.5 (92m) tablet once daily for 7 days. Then take 1 tablet once daily.    Dispense:  30 tablet    Refill:  1    Needs OV for further refills.    Order Specific Question:   Supervising Provider    Answer:   MBeatrice LecherD [[id number]]  . hydrochlorothiazide (HYDRODIURIL) 50 MG tablet    Sig: Take 1 tablet (50 mg total) by mouth daily.    Dispense:  30 tablet    Refill:  1    Order Specific Question:   Supervising Provider    Answer:   MBeatrice LecherD [[id number]]    Medications Discontinued During This Encounter  Medication Reason  . hydrochlorothiazide (HYDRODIURIL) 25 MG tablet Dose change       Time spent on visit including pre-visit chart review and post-visit care was 18 minutes.      The [place] signed [place] law in  2016 which includes the topic of electronic health records.  This provides immediate access to information in MyChart.  This includes consultation notes, operative notes, office notes, lab results and pathology reports.  If you have any questions about what you read please let us know at your next visit or call us at the office.  We are right here with you.   __________________________________________________________________________________     Patient Care Team    Relationship Specialty Notifications Start End  [name], [place] PCP - General Physician Assistant  10/24/19   Daneil Dolin, MD Consulting Physician Gastroenterology  03/08/14   Leandrew Koyanagi, MD Attending Physician Orthopedic Surgery  06/23/16   Hurley Cisco, MD Consulting Physician Rheumatology  06/23/16      -Vitals obtained; medications/ allergies reconciled;  personal medical, social, Sx etc.histories were updated by CMA, reviewed by me and are reflected in chart   Patient Active Problem List   Diagnosis Date Noted  . Anxiety 11/02/2019  . B12 deficiency 11/02/2019  . Adjustment disorder with mixed anxiety and  depressed mood 08/22/2019  . Iron deficiency anemia 08/22/2019  . Gastroesophageal reflux disease 08/22/2019  . Chronic joint pain 08/22/2019  . Stage 3 chronic kidney disease 08/22/2019  . Obesity (BMI 35.0-39.9 without comorbidity) 02/28/2019  . Normocytic anemia, not due to blood loss 02/28/2019  . Restless legs syndrome (RLS) 06/30/2018  . Arthrosis of first carpometacarpal joint 02/11/2018  . Primary osteoarthritis of first carpometacarpal joint of left hand   . Pain in left hand 12/23/2017  . Healthcare maintenance 11/16/2017  . Tinea versicolor 11/16/2017  . Chronic right shoulder pain 09/02/2017  . Fibromyalgia 08/11/2017  . Family history of diabetes mellitus 06/23/2016  . Other fatigue 06/23/2016  . Insomnia 06/23/2016  . Rheumatoid arthritis ([place]) 06/23/2016  . Nontraumatic incomplete tear of right rotator cuff 04/28/2016  . Impingement syndrome of right shoulder 03/31/2016  . Status post right partial knee replacement 09/15/2014  . Acute medial meniscal injury of knee 06/14/2014  . Status post total knee replacement using cement 06/08/2014  . Primary osteoarthritis of right knee 06/08/2014  . Screening for colon cancer   . Schatzki's ring   . Hiatal hernia   . Dysphagia, pharyngoesophageal phase 05/26/2014  . Encounter for screening colonoscopy 05/26/2014  . Adrenal adenoma 05/01/2014  . Breast lump 05/01/2014  . Metatarsalgia of both feet 01/12/2014  . Bilateral leg pain 01/11/2014  . Bilateral edema of lower extremity 01/11/2014  . Other osteoarthritis of spine, thoracolumbar region 12/26/2013  . Hypercholesteremia 05/19/2013  . Periodic health assessment, general screening, adult 04/13/2013  . GERD (gastroesophageal reflux disease) 04/13/2013  . Essential hypertension 04/13/2013  . Dyspnea 04/13/2013     Current Meds  Medication Sig  . DULoxetine (CYMBALTA) 30 MG capsule Take 3 capsules (90 mg total) by mouth daily.  . ferrous sulfate 325 (65 FE) MG EC  tablet Take 1 tablet (325 mg total) by mouth 2 (two) times daily.  Marland Kitchen lisinopril (ZESTRIL) 40 MG tablet Take 1 tablet (40 mg total) by mouth daily.  Marland Kitchen omeprazole (PRILOSEC) 20 MG capsule Take 1 capsule (20 mg total) by mouth daily.  . [DISCONTINUED] hydrochlorothiazide (HYDRODIURIL) 25 MG tablet Take 1 tablet (25 mg total) by mouth daily.     Allergies:  Allergies  Allergen Reactions  . Tramadol Itching    "bugs crawling all over"     ROS:  See above HPI for pertinent positives and negatives   Objective:   Blood pressure Marland Kitchen)  151/98, pulse 93, height 5' 3.5" (1.613 m), weight 225 lb (102.1 kg), last menstrual period 02/07/2013.  (if some vitals are omitted, this means that patient was UNABLE to obtain them even though they were asked to get them prior to OV today.  They were asked to call us at their earliest convenience with these once obtained. ) General: A & O * 3; sounds in no acute distress; in usual state of health.  Respiratory: speaking in full sentences, no conversational dyspnea;  Psych: insight appears good, mood- appears stable

## 2019-11-02 NOTE — Assessment & Plan Note (Signed)
-   Continue iron supplementation - Placed lab order for cbc and iron panel to recheck anemia given patient is symptomatic and reports compliance with iron supplementation. She may be a candidate for IV iron infusions.  - Encourage to include dietary iron.  - Will continue to monitor.

## 2019-11-02 NOTE — Assessment & Plan Note (Signed)
-   PHQ9 score of 13 and GAD score of 7 - Discussed with patient various treatment options and agreeable to starting low dose of Prozac 20 mg. - Encourage non-pharmacologic therapy such as good sleep hygiene and relaxation techniques. - Follow in 6-8 weeks to reassess symptoms and medication therapy.

## 2019-11-07 ENCOUNTER — Other Ambulatory Visit: Payer: Self-pay

## 2019-11-15 ENCOUNTER — Other Ambulatory Visit: Payer: Self-pay

## 2019-11-15 DIAGNOSIS — I10 Essential (primary) hypertension: Secondary | ICD-10-CM

## 2019-11-15 DIAGNOSIS — D509 Iron deficiency anemia, unspecified: Secondary | ICD-10-CM

## 2019-11-15 DIAGNOSIS — E538 Deficiency of other specified B group vitamins: Secondary | ICD-10-CM

## 2019-11-16 LAB — COMPREHENSIVE METABOLIC PANEL
ALT: 12 IU/L (ref 0–32)
AST: 13 IU/L (ref 0–40)
Albumin/Globulin Ratio: 1.7 (ref 1.2–2.2)
Albumin: 4 g/dL (ref 3.8–4.9)
Alkaline Phosphatase: 105 IU/L (ref 48–121)
BUN/Creatinine Ratio: 15 (ref 9–23)
BUN: 16 mg/dL (ref 6–24)
Bilirubin Total: <0.2 mg/dL (ref 0.0–1.2)
CO2: 25 mmol/L (ref 20–29)
Calcium: 9.6 mg/dL (ref 8.7–10.2)
Chloride: 99 mmol/L (ref 96–106)
Creatinine, Ser: 1.07 mg/dL — ABNORMAL HIGH (ref 0.57–1.00)
GFR calc Af Amer: 67 mL/min/{1.73_m2} (ref >59)
GFR calc non Af Amer: 58 mL/min/{1.73_m2} — ABNORMAL LOW (ref >59)
Globulin, Total: 2.4 g/dL (ref 1.5–4.5)
Glucose: 93 mg/dL (ref 65–99)
Potassium: 4.9 mmol/L (ref 3.5–5.2)
Sodium: 136 mmol/L (ref 134–144)
Total Protein: 6.4 g/dL (ref 6.0–8.5)

## 2019-11-16 LAB — CBC
Hematocrit: 33.4 % — ABNORMAL LOW (ref 34.0–46.6)
Hemoglobin: 10.8 g/dL — ABNORMAL LOW (ref 11.1–15.9)
MCH: 27 pg (ref 26.6–33.0)
MCHC: 32.3 g/dL (ref 31.5–35.7)
MCV: 84 fL (ref 79–97)
Platelets: 445 10*3/uL (ref 150–450)
RBC: 4 x10E6/uL (ref 3.77–5.28)
RDW: 15.8 % — ABNORMAL HIGH (ref 11.7–15.4)
WBC: 7.2 10*3/uL (ref 3.4–10.8)

## 2019-11-16 LAB — IRON,TIBC AND FERRITIN PANEL
Ferritin: 11 ng/mL — ABNORMAL LOW (ref 15–150)
Iron Saturation: 7 % — CL (ref 15–55)
Iron: 30 ug/dL (ref 27–159)
Total Iron Binding Capacity: 448 ug/dL (ref 250–450)
UIBC: 418 ug/dL (ref 131–425)

## 2019-11-16 LAB — VITAMIN B12: Vitamin B-12: 246 pg/mL (ref 232–1245)

## 2019-12-28 ENCOUNTER — Other Ambulatory Visit: Payer: Self-pay | Admitting: Physician Assistant

## 2020-01-02 ENCOUNTER — Encounter: Payer: Self-pay | Admitting: Physician Assistant

## 2020-01-02 ENCOUNTER — Other Ambulatory Visit: Payer: Self-pay

## 2020-01-02 ENCOUNTER — Ambulatory Visit: Payer: Self-pay | Admitting: Physician Assistant

## 2020-01-02 VITALS — BP 166/97 | Ht 63.5 in | Wt 210.0 lb

## 2020-01-02 DIAGNOSIS — I10 Essential (primary) hypertension: Secondary | ICD-10-CM

## 2020-01-02 NOTE — Progress Notes (Signed)
OF NOTE: Unable to contact patient, made several attempts.   Telehealth office visit note for Brandi Reid, PA-C- at [facility]   I connected with current patient today by telephone and verified that I am speaking with the correct person   . Location of the patient: Home . Location of the provider: Office - This visit type was conducted due to national recommendations for restrictions regarding the COVID-19 Pandemic (e.g. social distancing) in an effort to limit this patient's exposure and mitigate transmission in our community.    - No physical exam could be performed with this format, beyond that communicated to [place] by the patient/ family members as noted.   - Additionally my office staff/ schedulers were to discuss with the patient that there may be a monetary charge related to this service, depending on their medical insurance.  My understanding is that patient understood and consented to proceed.     _________________________________________________________________________________   History of Present Illness:  Unable to reach patient. No charge visit     GAD 7 : Generalized Anxiety Score 01/02/2020 11/02/2019 08/22/2019 04/23/2016  Nervous, Anxious, on Edge 1 0 3 1  Control/stop worrying 3 3 3 1   Worry too much - different things 3 3 3 1   Trouble relaxing 3 0 3 1  Restless 1 0 3 1  Easily annoyed or irritable 1 1 2 1   Afraid - awful might happen 0 0 1 0  Total GAD 7 Score 12 7 18 6   Anxiety Difficulty Somewhat difficult Not difficult at all Very difficult -    Depression screen [facility] 2/9 01/02/2020 11/02/2019 08/22/2019 01/11/2019 06/30/2018  Decreased Interest 1 1 3 1 1   Down, Depressed, Hopeless 3 3 3 3 3   PHQ - 2 Score 4 4 6 4 4   Altered sleeping 3 3 3 3 3   Tired, decreased energy 3 3 3 3 3   Change in appetite 0 0 2 1 0  Feeling bad or failure about yourself  3 3 3 2 2   Trouble concentrating 0 0 0 0 0  Moving slowly or fidgety/restless 0 0 0 0 0   Suicidal thoughts 1 0 3 0 2  PHQ-9 Score 14 13 20 13 14   Difficult doing work/chores Extremely dIfficult Not difficult at all Very difficult Extremely dIfficult -  Some recent data might be hidden      Impression and Recommendations:     No diagnosis found.     - As part of my medical decision making, I reviewed the following data within the [place] History obtained from pt /family, CMA notes reviewed and incorporated if applicable, Labs reviewed, Radiograph/ tests reviewed if applicable and OV notes from prior OV's with me, as well as any other specialists she/he has seen since seeing me last, were all reviewed and used in my medical decision making process today.    - Additionally, when appropriate, discussion had with patient regarding our treatment plan, and their biases/concerns about that plan were used in my medical decision making today.    - The patient agreed with the plan and demonstrated an understanding of the instructions.   No barriers to understanding were identified.     - The patient was advised to call back or seek an in-person evaluation if the symptoms worsen or if the condition fails to improve as anticipated.   No follow-ups on file.    No orders of the defined types were placed  in this encounter.   No orders of the defined types were placed in this encounter.   There are no discontinued medications.     Time spent on visit including pre-visit chart review and post-visit care was 0 minutes.      The [place] was signed into law in 2016 which includes the topic of electronic health records.  This provides immediate access to information in MyChart.  This includes consultation notes, operative notes, office notes, lab results and pathology reports.  If you have any questions about what you read please let us know at your next visit or call us at the office.  We are right here with  you.   __________________________________________________________________________________     Patient Care Team    Relationship Specialty Notifications Start End  [name], [place] PCP - General Physician Assistant  10/24/19   Daneil Dolin, MD Consulting Physician Gastroenterology  03/08/14   Leandrew Koyanagi, MD Attending Physician Orthopedic Surgery  06/23/16   Hurley Cisco, MD Consulting Physician Rheumatology  06/23/16      -Vitals obtained; medications/ allergies reconciled;  personal medical, social, Sx etc.histories were updated by CMA, reviewed by me and are reflected in chart   Patient Active Problem List   Diagnosis Date Noted  . Anxiety 11/02/2019  . B12 deficiency 11/02/2019  . Adjustment disorder with mixed anxiety and depressed mood 08/22/2019  . Iron deficiency anemia 08/22/2019  . Gastroesophageal reflux disease 08/22/2019  . Chronic joint pain 08/22/2019  . Stage 3 chronic kidney disease 08/22/2019  . Obesity (BMI 35.0-39.9 without comorbidity) 02/28/2019  . Normocytic anemia, not due to blood loss 02/28/2019  . Restless legs syndrome (RLS) 06/30/2018  . Arthrosis of first carpometacarpal joint 02/11/2018  . Primary osteoarthritis of first carpometacarpal joint of left hand   . Pain in left hand 12/23/2017  . Healthcare maintenance 11/16/2017  . Tinea versicolor 11/16/2017  . Chronic right shoulder pain 09/02/2017  . Fibromyalgia 08/11/2017  . Family history of diabetes mellitus 06/23/2016  . Other fatigue 06/23/2016  . Insomnia 06/23/2016  . Rheumatoid arthritis ([place]) 06/23/2016  . Nontraumatic incomplete tear of right rotator cuff 04/28/2016  . Impingement syndrome of right shoulder 03/31/2016  . Status post right partial knee replacement 09/15/2014  . Acute medial meniscal injury of knee 06/14/2014  . Status post total knee replacement using cement 06/08/2014  . Primary osteoarthritis of right knee 06/08/2014  . Screening for colon cancer   .  Schatzki's ring   . Hiatal hernia   . Dysphagia, pharyngoesophageal phase 05/26/2014  . Encounter for screening colonoscopy 05/26/2014  . Adrenal adenoma 05/01/2014  . Breast lump 05/01/2014  . Metatarsalgia of both feet 01/12/2014  . Bilateral leg pain 01/11/2014  . Bilateral edema of lower extremity 01/11/2014  . Other osteoarthritis of spine, thoracolumbar region 12/26/2013  . Hypercholesteremia 05/19/2013  . Periodic health assessment, general screening, adult 04/13/2013  . GERD (gastroesophageal reflux disease) 04/13/2013  . Essential hypertension 04/13/2013  . Dyspnea 04/13/2013     Current Meds  Medication Sig  . DULoxetine (CYMBALTA) 30 MG capsule Take 3 capsules (90 mg total) by mouth daily.  . ferrous sulfate 325 (65 FE) MG EC tablet Take 1 tablet (325 mg total) by mouth 2 (two) times daily.  Marland Kitchen FLUoxetine (PROZAC) 20 MG tablet Take 0.5 (10mg ) tablet once daily for 7 days. Then take 1 tablet once daily.  . hydrochlorothiazide (HYDRODIURIL) 50 MG tablet Take 1 tablet (50 mg total) by  mouth daily.  Marland Kitchen lisinopril (ZESTRIL) 40 MG tablet Take 1 Tablet by mouth Daily  . omeprazole (PRILOSEC) 20 MG capsule Take 1 capsule (20 mg total) by mouth daily.     Allergies:  Allergies  Allergen Reactions  . Tramadol Itching    "bugs crawling all over"     ROS:  See above HPI for pertinent positives and negatives   Objective:   Blood pressure (!) 166/97, height 5' 3.5" (1.613 m), weight 210 lb (95.3 kg), last menstrual period 02/07/2013.  (if some vitals are omitted, this means that patient was UNABLE to obtain them even though they were asked to get them prior to OV today.  They were asked to call us at their earliest convenience with these once obtained. ) General: A & O * 3; sounds in no acute distress; in usual state of health.  Skin: Pt confirms warm and dry extremities and pink fingertips HEENT: Pt confirms lips non-cyanotic Chest: Patient confirms normal chest excursion  and movement Respiratory: speaking in full sentences, no conversational dyspnea; patient confirms no use of accessory muscles Psych: insight appears good, mood- appears full

## 2020-01-12 ENCOUNTER — Ambulatory Visit: Payer: Self-pay | Admitting: Physician Assistant

## 2020-01-12 ENCOUNTER — Encounter: Payer: Self-pay | Admitting: Physician Assistant

## 2020-01-12 ENCOUNTER — Other Ambulatory Visit: Payer: Self-pay

## 2020-01-12 ENCOUNTER — Telehealth: Payer: Self-pay | Admitting: Physician Assistant

## 2020-01-12 NOTE — Telephone Encounter (Signed)
Patient had telehealth apt with provider scheduled. Called to get preliminary information and patient states BP 175/100. Patient states she has a headache and has been nauseous.   Spoke with [name] who advised patient needs to go to ED for evaluation.   Patient is aware and verbalized understanding. Apt canceled. AS, CMA

## 2020-01-24 ENCOUNTER — Ambulatory Visit (INDEPENDENT_AMBULATORY_CARE_PROVIDER_SITE_OTHER): Payer: Self-pay | Admitting: Orthopaedic Surgery

## 2020-01-24 ENCOUNTER — Ambulatory Visit (INDEPENDENT_AMBULATORY_CARE_PROVIDER_SITE_OTHER): Payer: Self-pay

## 2020-01-24 ENCOUNTER — Ambulatory Visit: Payer: Self-pay

## 2020-01-24 ENCOUNTER — Encounter: Payer: Self-pay | Admitting: Orthopaedic Surgery

## 2020-01-24 VITALS — Ht 64.0 in | Wt 226.0 lb

## 2020-01-24 DIAGNOSIS — Z96651 Presence of right artificial knee joint: Secondary | ICD-10-CM

## 2020-01-24 DIAGNOSIS — Z96652 Presence of left artificial knee joint: Secondary | ICD-10-CM

## 2020-01-24 DIAGNOSIS — M25532 Pain in left wrist: Secondary | ICD-10-CM

## 2020-01-24 DIAGNOSIS — M79642 Pain in left hand: Secondary | ICD-10-CM

## 2020-01-24 NOTE — Progress Notes (Signed)
Office Visit Note   Patient: [name]           Date of Birth: Mar 27, 1961           MRN: [id number] Visit Date: 01/24/2020              Requested by: Lorrene Reid, PA-C [place] [place],  [place] [address] PCP: Lorrene Reid, PA-C   Assessment & Plan: Visit Diagnoses:  1. Pain in left wrist   2. H/O total knee replacement, left   3. S/P right unicompartmental knee replacement   4. Pain in left hand     Plan: Impression is no acute findings to either knee.  Treat symptomatically.  In regards to the left hand, we will go ahead and obtain a CT scan to further assess for implant failure and subsidence of the suspension plasty.  She will follow up with [place] once this has been completed.  Follow-Up Instructions: Return if symptoms worsen or fail to improve.   Orders:  Orders Placed This Encounter  Procedures  . XR Wrist Complete Left  . XR KNEE 3 VIEW LEFT  . XR KNEE 3 VIEW RIGHT  . CT HAND LEFT WO CONTRAST   No orders of the defined types were placed in this encounter.     Procedures: No procedures performed   Clinical Data: No additional findings.   Subjective: Chief Complaint  Patient presents with  . Left Wrist - Pain    HPI patient is a pleasant 59 year old female who comes in today with left wrist pain as well as concerns about both knees.  In regards to the left wrist, she is little over 2 years out from left first [facility] arthroplasty.  About a year ago, she noticed what felt like a painful knot along the first [facility] joint.  She notes it is painful when she flexes her thumb.  She also notes daily locking to the thumb.  Other issue she brings up is giving way to the left knee as well as pain to the anterior right knee.  In regards to the left knee, she is status post left total knee replacement by Dr. Ellouise Newer about 18 years ago.  She has been noticing increasing giving way without pain.  In regards to the right knee, she is about 2 years out  from medial compartment replacement.  She fell about a week ago to the anterior aspect.  Since then she has had pain over the patella which has mildly improved over the past week.  He also noticed pain to the medial aspect with lunges.  Review of Systems as detailed in HPI.  All others reviewed and are negative.   Objective: Vital Signs: Ht 5\' 4"  (1.626 m)   Wt 226 lb (102.5 kg)   LMP 02/07/2013   BMI 38.79 kg/m   Physical Exam well-developed well-nourished female no acute distress.  Alert oriented x3.  Ortho Exam examination of the left wrist reveals a palpable nodule over the base of the first metacarpal.  This is moderately tender.  No skin changes.  Examination of the left knee reveals no joint line tenderness.  Range of motion 0 to 110 degrees.  She is stable to valgus varus stress.  Regards to the right knee, she has moderate tenderness to the mid patella.  No joint line tenderness.  Stable valgus varus stress.  She is neurovascular intact distally.  Specialty Comments:  No specialty comments available.  Imaging: XR  Wrist Complete Left  Result Date: 01/24/2020 X-rays show bony erosion from the implant through the proximal aspect of the second metacarpal.  There is also a fairly wide gap between the base of the first and second metacarpals as well as abutment between the first metacarpal and scaphoid  XR KNEE 3 VIEW LEFT  Result Date: 01/24/2020 Well-seated prosthesis without complication  XR KNEE 3 VIEW RIGHT  Result Date: 01/24/2020 Well-seated medial compartment replacement without complication.  No evidence of fracture.  She does have findings of early arthritis to the lateral and patellofemoral compartments    PMFS History: Patient Active Problem List   Diagnosis Date Noted  . Anxiety 11/02/2019  . B12 deficiency 11/02/2019  . Adjustment disorder with mixed anxiety and depressed mood 08/22/2019  . Iron deficiency anemia 08/22/2019  . Gastroesophageal reflux disease  08/22/2019  . Chronic joint pain 08/22/2019  . Stage 3 chronic kidney disease 08/22/2019  . Obesity (BMI 35.0-39.9 without comorbidity) 02/28/2019  . Normocytic anemia, not due to blood loss 02/28/2019  . Restless legs syndrome (RLS) 06/30/2018  . Arthrosis of first carpometacarpal joint 02/11/2018  . Primary osteoarthritis of first carpometacarpal joint of left hand   . Pain in left hand 12/23/2017  . Healthcare maintenance 11/16/2017  . Tinea versicolor 11/16/2017  . Chronic right shoulder pain 09/02/2017  . Fibromyalgia 08/11/2017  . Family history of diabetes mellitus 06/23/2016  . Other fatigue 06/23/2016  . Insomnia 06/23/2016  . Rheumatoid arthritis ([place]) 06/23/2016  . Nontraumatic incomplete tear of right rotator cuff 04/28/2016  . Impingement syndrome of right shoulder 03/31/2016  . Status post right partial knee replacement 09/15/2014  . Acute medial meniscal injury of knee 06/14/2014  . Status post total knee replacement using cement 06/08/2014  . Primary osteoarthritis of right knee 06/08/2014  . Screening for colon cancer   . Schatzki's ring   . Hiatal hernia   . Dysphagia, pharyngoesophageal phase 05/26/2014  . Encounter for screening colonoscopy 05/26/2014  . Adrenal adenoma 05/01/2014  . Breast lump 05/01/2014  . Metatarsalgia of both feet 01/12/2014  . Bilateral leg pain 01/11/2014  . Bilateral edema of lower extremity 01/11/2014  . Other osteoarthritis of spine, thoracolumbar region 12/26/2013  . Hypercholesteremia 05/19/2013  . Periodic health assessment, general screening, adult 04/13/2013  . GERD (gastroesophageal reflux disease) 04/13/2013  . Essential hypertension 04/13/2013  . Dyspnea 04/13/2013   Past Medical History:  Diagnosis Date  . Acid reflux    takes Zantac and Omeprazole daily  . Anxiety    takes Citaopram daily  . Arthritis    right knee  . Chronic back pain    DDD  . Clotting disorder (HCC)    hx of blood clot following knee scope    . History of bronchitis 3+yrs ago  . Hyperlipidemia    takes Atorvastatin daily  . Hypertension    takes Lisinopril and HCTZ daily  . Insomnia    takes Elavil nightly as needed  . Paraesophageal hernia     Family History  Problem Relation Age of Onset  . Cancer Father        lung  . Cancer Sister        breast  . Breast cancer Sister 47  . Cancer Maternal Grandfather        lung  . Cancer Paternal Grandfather        lung  . Diabetes Son   . Colon cancer Neg Hx     Past Surgical History:  Procedure Laterality Date  . BREAST BIOPSY Left   . BUNIONECTOMY Right 02/18/2018   Procedure: Yehuda Budd;  Surgeon: Edrick Kins, DPM;  Location: [place];  Service: Podiatry;  Laterality: Right;  . CAPSULOTOMY Bilateral 02/18/2018   Procedure: CAPSULOTOMY MPJ RELEASE JOINT 2N BILATERAL;  Surgeon: Edrick Kins, DPM;  Location: [place];  Service: Podiatry;  Laterality: Bilateral;  . CARPOMETACARPEL SUSPENSION PLASTY Left 01/27/2018   Procedure: LEFT THUMB ligament reconstruction and tendon interposition;  Surgeon: Leandrew Koyanagi, MD;  Location: [place];  Service: Orthopedics;  Laterality: Left;  . CHONDROPLASTY Right 06/28/2014   Procedure: CHONDROPLASTY;  Surgeon: Marianna Payment, MD;  Location: [place];  Service: Orthopedics;  Laterality: Right;  . COLONOSCOPY N/A 05/29/2014   Procedure: COLONOSCOPY;  Surgeon: Daneil Dolin, MD;  [facility];  Service: Endoscopy;  Laterality: N/A;  215pm- Pt is working until 12:00 so she can't come any earlier  . ESOPHAGOGASTRODUODENOSCOPY N/A 05/29/2014   Procedure: ESOPHAGOGASTRODUODENOSCOPY (EGD);  Surgeon: Daneil Dolin, MD;  [facility];  Service: Endoscopy;  Laterality: N/A;  . GANGLION CYST EXCISION Left 01/13/2002  . HAMMER TOE SURGERY Bilateral 02/18/2018   Procedure: HAMMER TOE CORRECTION2ND BILATERAL;  Surgeon: Edrick Kins, DPM;  Location: [place];  Service: Podiatry;   Laterality: Bilateral;  . KNEE ARTHROSCOPY WITH MEDIAL MENISECTOMY Right 06/28/2014   Procedure: RIGHT KNEE ARTHROSCOPY WITH PARTIAL MEDIAL MENISCECTOMY AND CHONDROPLASTY;  Surgeon: Marianna Payment, MD;  Location: [place];  Service: Orthopedics;  Laterality: Right;  . MALONEY DILATION N/A 05/29/2014   Procedure: Venia Minks DILATION;  Surgeon: Daneil Dolin, MD;  [facility];  Service: Endoscopy;  Laterality: N/A;  . PARTIAL KNEE ARTHROPLASTY Right 09/15/2014   Procedure: RIGHT UNICOMPARTMENTAL KNEE ARTHROPLASTY;  Surgeon: Leandrew Koyanagi, MD;  Location: [place];  Service: Orthopedics;  Laterality: Right;  . TOTAL KNEE ARTHROPLASTY Left 04/08/2004   Social History   Occupational History  . Occupation: works as Geneticist, molecular  . Smoking status: Never Smoker  . Smokeless tobacco: Never Used  Vaping Use  . Vaping Use: Never used  Substance and Sexual Activity  . Alcohol use: No    Alcohol/week: 0.0 standard drinks  . Drug use: No  . Sexual activity: Not Currently    Birth control/protection: None

## 2020-01-25 ENCOUNTER — Telehealth: Payer: Self-pay | Admitting: Orthopaedic Surgery

## 2020-01-30 ENCOUNTER — Ambulatory Visit (INDEPENDENT_AMBULATORY_CARE_PROVIDER_SITE_OTHER): Payer: Self-pay

## 2020-01-30 ENCOUNTER — Ambulatory Visit (INDEPENDENT_AMBULATORY_CARE_PROVIDER_SITE_OTHER): Payer: Self-pay | Admitting: Podiatry

## 2020-01-30 ENCOUNTER — Other Ambulatory Visit: Payer: Self-pay

## 2020-01-30 DIAGNOSIS — M25571 Pain in right ankle and joints of right foot: Secondary | ICD-10-CM

## 2020-01-30 DIAGNOSIS — G629 Polyneuropathy, unspecified: Secondary | ICD-10-CM

## 2020-01-30 DIAGNOSIS — M19072 Primary osteoarthritis, left ankle and foot: Secondary | ICD-10-CM

## 2020-01-30 DIAGNOSIS — M25572 Pain in left ankle and joints of left foot: Secondary | ICD-10-CM

## 2020-01-30 DIAGNOSIS — M19071 Primary osteoarthritis, right ankle and foot: Secondary | ICD-10-CM

## 2020-01-30 MED ORDER — METHYLPREDNISOLONE 4 MG PO TBPK: ORAL_TABLET | ORAL | 0 refills | Status: DC

## 2020-01-30 MED ORDER — MELOXICAM 15 MG PO TABS: 15.0000 mg | ORAL_TABLET | Freq: Every day | ORAL | 1 refills | Status: DC

## 2020-01-30 NOTE — Progress Notes (Signed)
   Subjective:  59 y.o. female presenting today as an established patient for evaluation of bilateral ankle pain that is been chronic and ongoing for several months now.  Patient states it is extremely painful with work.  She works 2 times per week and is unable to perform her job duties.  She has been working with her primary care physician for management of peripheral neuropathy and currently taking Cymbalta.  She describes the pain is extending all the way up to her legs and she has tried multiple conservative modalities which have been unsuccessful in providing any sort of satisfactory alleviation of symptoms.  She presents for follow-up treatment and evaluation   Past Medical History:  Diagnosis Date  . Acid reflux    takes Zantac and Omeprazole daily  . Anxiety    takes Citaopram daily  . Arthritis    right knee  . Chronic back pain    DDD  . Clotting disorder (HCC)    hx of blood clot following knee scope  . History of bronchitis 3+yrs ago  . Hyperlipidemia    takes Atorvastatin daily  . Hypertension    takes Lisinopril and HCTZ daily  . Insomnia    takes Elavil nightly as needed  . Paraesophageal hernia      Objective / Physical Exam:  General:  The patient is alert and oriented x3 in no acute distress. Dermatology:  Skin is warm, dry and supple bilateral lower extremities. Negative for open lesions or macerations. Vascular:  Palpable pedal pulses bilaterally. No edema or erythema noted. Capillary refill within normal limits. Neurological:  Epicritic and protective threshold grossly intact bilaterally.  Musculoskeletal Exam:  Pain on palpation to the anterior lateral medial aspects of the patient's bilateral ankles.  Negative for any significant edema noted. Range of motion within normal limits to all pedal and ankle joints bilateral. Muscle strength 5/5 in all groups bilateral.   Radiographic Exam:  Normal osseous mineralization.  There is some joint space narrowing  and degenerative changes noted diffusely throughout the pedal structures of the foot consistent with findings of DJD.  Orthopedic screws to the right bunion surgery are intact.  Complete healing of the bunionectomy and plastic surgeries of the second digits bilateral noted. No fracture/dislocation/boney destruction.    Assessment: 1.  Chronic DJD/capsulitis bilateral ankles 2.  Peripheral polyneuropathy bilateral lower extremities  Plan of Care:  1. Patient was evaluated. X-Rays reviewed.  2.  Patient declined injections today 3.  Prescription for Medrol Dosepak. 4.  Prescription for meloxicam 15 mg daily.  Begin taking after completion of the Dosepak 5.  Recommend good supportive sneakers and shoes. 6.  Patient's pain is chronic and she is unable to perform her job functions.  She works at a gas station 2x/week 4-hr shifts and is unable to complete her shift without pain. 7.  Return to clinic as needed   Edrick Kins, DPM [facility]  Dr. Edrick Kins, [address]                                        [place], [place] [address]                Office [phone]  Fax [phone]

## 2020-02-02 ENCOUNTER — Other Ambulatory Visit: Payer: Self-pay | Admitting: Podiatry

## 2020-02-02 DIAGNOSIS — M19072 Primary osteoarthritis, left ankle and foot: Secondary | ICD-10-CM

## 2020-02-07 ENCOUNTER — Ambulatory Visit
Admission: RE | Admit: 2020-02-07 | Discharge: 2020-02-07 | Disposition: A | Discharge status: Home / Self Care | Payer: No Typology Code available for payment source | Source: Ambulatory Visit | Attending: Orthopaedic Surgery | Admitting: Orthopaedic Surgery

## 2020-02-07 ENCOUNTER — Other Ambulatory Visit: Payer: Self-pay

## 2020-02-07 DIAGNOSIS — M79642 Pain in left hand: Secondary | ICD-10-CM

## 2020-02-07 DIAGNOSIS — M25532 Pain in left wrist: Secondary | ICD-10-CM

## 2020-02-14 ENCOUNTER — Ambulatory Visit (INDEPENDENT_AMBULATORY_CARE_PROVIDER_SITE_OTHER): Payer: No Typology Code available for payment source | Admitting: Orthopaedic Surgery

## 2020-02-14 ENCOUNTER — Encounter: Payer: Self-pay | Admitting: Orthopaedic Surgery

## 2020-02-14 DIAGNOSIS — M189 Osteoarthritis of first carpometacarpal joint, unspecified: Secondary | ICD-10-CM

## 2020-02-14 DIAGNOSIS — M79642 Pain in left hand: Secondary | ICD-10-CM

## 2020-02-14 NOTE — Progress Notes (Signed)
Office Visit Note   Patient: [name]           Date of Birth: July 04, 1960           MRN: [id number] Visit Date: 02/14/2020              Requested by: Brandi Reid, PA-C [place],  [place] [address] PCP: Brandi Reid, PA-C   Assessment & Plan: Visit Diagnoses:  1. Pain in left hand   2. Arthrosis of first carpometacarpal joint     Plan: CT scan and x-rays show that the thumb metacarpal has subsided and there is erosion into the second metacarpal by the suture button. She has degenerative changes of the distal pole of the scaphoid as well as trapezoid and the base of the second metacarpal. These findings were all reviewed with the patient in detail. At this point I recommend evaluation by hand surgeon for possible options for revision surgery. Due to her current insurance we may have difficulty finding a hand surgeon who will accept the referral and that we may have to wait until she gets disability. She understands all this and that we we will see if we can find an appropriate hand surgeon to take her case.  Follow-Up Instructions: Return if symptoms worsen or fail to improve.   Orders:  No orders of the defined types were placed in this encounter.  No orders of the defined types were placed in this encounter.     Procedures: No procedures performed   Clinical Data: No additional findings.   Subjective: Chief Complaint  Patient presents with  . Left Hand - Pain    [name] is here today for CT scan review. No changes in symptoms.   Review of Systems  Constitutional: Negative.   HENT: Negative.   Eyes: Negative.   Respiratory: Negative.   Cardiovascular: Negative.   Endocrine: Negative.   Musculoskeletal: Negative.   Neurological: Negative.   Hematological: Negative.   Psychiatric/Behavioral: Negative.   All other systems reviewed and are negative.    Objective: Vital Signs: LMP 02/07/2013   Physical Exam Vitals and nursing  note reviewed.  Constitutional:      Appearance: She is well-developed.  Pulmonary:     Effort: Pulmonary effort is normal.  Skin:    General: Skin is warm.     Capillary Refill: Capillary refill takes less than 2 seconds.  Neurological:     Mental Status: She is alert and oriented to person, place, and time.  Psychiatric:        Behavior: Behavior normal.        Thought Content: Thought content normal.        Judgment: Judgment normal.     Ortho Exam Exam is unchanged of the left hand. Specialty Comments:  No specialty comments available.  Imaging: No results found.   PMFS History: Patient Active Problem List   Diagnosis Date Noted  . Anxiety 11/02/2019  . B12 deficiency 11/02/2019  . Adjustment disorder with mixed anxiety and depressed mood 08/22/2019  . Iron deficiency anemia 08/22/2019  . Gastroesophageal reflux disease 08/22/2019  . Chronic joint pain 08/22/2019  . Stage 3 chronic kidney disease ([facility]) 08/22/2019  . Obesity (BMI 35.0-39.9 without comorbidity) 02/28/2019  . Normocytic anemia, not due to blood loss 02/28/2019  . Restless legs syndrome (RLS) 06/30/2018  . Arthrosis of first carpometacarpal joint 02/11/2018  . Primary osteoarthritis of first carpometacarpal joint of left hand   .  Pain in left hand 12/23/2017  . Healthcare maintenance 11/16/2017  . Tinea versicolor 11/16/2017  . Chronic right shoulder pain 09/02/2017  . Fibromyalgia 08/11/2017  . Family history of diabetes mellitus 06/23/2016  . Other fatigue 06/23/2016  . Insomnia 06/23/2016  . Rheumatoid arthritis ([facility]) 06/23/2016  . Nontraumatic incomplete tear of right rotator cuff 04/28/2016  . Impingement syndrome of right shoulder 03/31/2016  . Status post right partial knee replacement 09/15/2014  . Acute medial meniscal injury of knee 06/14/2014  . Status post total knee replacement using cement 06/08/2014  . Primary osteoarthritis of right knee 06/08/2014  . Screening for colon cancer    . Schatzki's ring   . Hiatal hernia   . Dysphagia, pharyngoesophageal phase 05/26/2014  . Encounter for screening colonoscopy 05/26/2014  . Adrenal adenoma 05/01/2014  . Breast lump 05/01/2014  . Metatarsalgia of both feet 01/12/2014  . Bilateral leg pain 01/11/2014  . Bilateral edema of lower extremity 01/11/2014  . Other osteoarthritis of spine, thoracolumbar region 12/26/2013  . Hypercholesteremia 05/19/2013  . Periodic health assessment, general screening, adult 04/13/2013  . GERD (gastroesophageal reflux disease) 04/13/2013  . Essential hypertension 04/13/2013  . Dyspnea 04/13/2013   Past Medical History:  Diagnosis Date  . Acid reflux    takes Zantac and Omeprazole daily  . Anxiety    takes Citaopram daily  . Arthritis    right knee  . Chronic back pain    DDD  . Clotting disorder (H[facility])    hx of blood clot following knee scope  . History of bronchitis 3+yrs ago  . Hyperlipidemia    takes Atorvastatin daily  . Hypertension    takes Lisinopril and HCTZ daily  . Insomnia    takes Elavil nightly as needed  . Paraesophageal hernia     Family History  Problem Relation Age of Onset  . Cancer Father        lung  . Cancer Sister        breast  . Breast cancer Sister 78  . Cancer Maternal Grandfather        lung  . Cancer Paternal Grandfather        lung  . Diabetes Son   . Colon cancer Neg Hx     Past Surgical History:  Procedure Laterality Date  . BREAST BIOPSY Left   . BUNIONECTOMY Right 02/18/2018   Procedure: [name];  Surgeon: [name], DPM;  Location: [facility];  Service: Podiatry;  Laterality: Right;  . CAPSULOTOMY Bilateral 02/18/2018   Procedure: CAPSULOTOMY MPJ RELEASE JOINT 2N BILATERAL;  Surgeon: [name], DPM;  Location: [facility];  Service: Podiatry;  Laterality: Bilateral;  . CARPOMETACARPEL SUSPENSION PLASTY Left 01/27/2018   Procedure: LEFT THUMB ligament reconstruction and tendon interposition;  Surgeon: Brandi Koyanagi, MD;   Location: [facility];  Service: Orthopedics;  Laterality: Left;  . CHONDROPLASTY Right 06/28/2014   Procedure: CHONDROPLASTY;  Surgeon: Marianna Payment, MD;  Location: [facility];  Service: Orthopedics;  Laterality: Right;  . COLONOSCOPY N/A 05/29/2014   Procedure: COLONOSCOPY;  Surgeon: Daneil Dolin, MD;  [facility];  Service: Endoscopy;  Laterality: N/A;  215pm- Pt is working until 12:00 so she can't come any earlier  . ESOPHAGOGASTRODUODENOSCOPY N/A 05/29/2014   Procedure: ESOPHAGOGASTRODUODENOSCOPY (EGD);  Surgeon: Daneil Dolin, MD;  [facility];  Service: Endoscopy;  Laterality: N/A;  . GANGLION CYST EXCISION Left 01/13/2002  . HAMMER TOE SURGERY Bilateral  02/18/2018   Procedure: HAMMER TOE CORRECTION2ND BILATERAL;  Surgeon: [name], DPM;  Location: [facility];  Service: Podiatry;  Laterality: Bilateral;  . KNEE ARTHROSCOPY WITH MEDIAL MENISECTOMY Right 06/28/2014   Procedure: RIGHT KNEE ARTHROSCOPY WITH PARTIAL MEDIAL MENISCECTOMY AND CHONDROPLASTY;  Surgeon: Marianna Payment, MD;  Location: [facility];  Service: Orthopedics;  Laterality: Right;  . MALONEY DILATION N/A 05/29/2014   Procedure: Venia Minks DILATION;  Surgeon: Daneil Dolin, MD;  [facility];  Service: Endoscopy;  Laterality: N/A;  . PARTIAL KNEE ARTHROPLASTY Right 09/15/2014   Procedure: RIGHT UNICOMPARTMENTAL KNEE ARTHROPLASTY;  Surgeon: Brandi Koyanagi, MD;  Location: [facility];  Service: Orthopedics;  Laterality: Right;  . TOTAL KNEE ARTHROPLASTY Left 04/08/2004   Social History   Occupational History  . Occupation: works as Geneticist, molecular  . Smoking status: Never Smoker  . Smokeless tobacco: Never Used  Vaping Use  . Vaping Use: Never used  Substance and Sexual Activity  . Alcohol use: No    Alcohol/week: 0.0 standard drinks  . Drug use: No  . Sexual activity: Not Currently    Birth control/protection: None

## 2020-02-15 NOTE — Addendum Note (Signed)
Addended by: Precious Bard on: 02/15/2020 02:28 PM   Modules accepted: Orders

## 2020-02-15 NOTE — Addendum Note (Signed)
Addended by: Precious Bard on: 02/15/2020 02:47 PM   Modules accepted: Orders

## 2020-02-29 ENCOUNTER — Encounter (HOSPITAL_BASED_OUTPATIENT_CLINIC_OR_DEPARTMENT_OTHER): Payer: Self-pay | Admitting: Orthopaedic Surgery

## 2020-02-29 ENCOUNTER — Other Ambulatory Visit: Payer: Self-pay

## 2020-03-03 ENCOUNTER — Other Ambulatory Visit (HOSPITAL_COMMUNITY): Admission: RE | Admit: 2020-03-03 | Payer: No Typology Code available for payment source | Source: Ambulatory Visit

## 2020-03-05 ENCOUNTER — Encounter (HOSPITAL_BASED_OUTPATIENT_CLINIC_OR_DEPARTMENT_OTHER)
Admission: RE | Admit: 2020-03-05 | Discharge: 2020-03-05 | Disposition: A | Discharge status: Home / Self Care | Payer: No Typology Code available for payment source | Source: Ambulatory Visit | Attending: Orthopaedic Surgery | Admitting: Orthopaedic Surgery

## 2020-03-05 ENCOUNTER — Other Ambulatory Visit: Payer: Self-pay | Admitting: Physician Assistant

## 2020-03-05 ENCOUNTER — Other Ambulatory Visit (HOSPITAL_COMMUNITY)
Admission: RE | Admit: 2020-03-05 | Discharge: 2020-03-05 | Disposition: A | Discharge status: Home / Self Care | Payer: No Typology Code available for payment source | Source: Ambulatory Visit | Attending: Orthopaedic Surgery | Admitting: Orthopaedic Surgery

## 2020-03-05 DIAGNOSIS — Z01818 Encounter for other preprocedural examination: Secondary | ICD-10-CM | POA: Insufficient documentation

## 2020-03-05 DIAGNOSIS — Z20822 Contact with and (suspected) exposure to covid-19: Secondary | ICD-10-CM | POA: Insufficient documentation

## 2020-03-05 DIAGNOSIS — I10 Essential (primary) hypertension: Secondary | ICD-10-CM | POA: Insufficient documentation

## 2020-03-05 LAB — SARS CORONAVIRUS 2 (TAT 6-24 HRS): SARS Coronavirus 2: NEGATIVE

## 2020-03-05 LAB — BASIC METABOLIC PANEL
Anion gap: 7 (ref 5–15)
BUN: 8 mg/dL (ref 6–20)
CO2: 27 mmol/L (ref 22–32)
Calcium: 8.6 mg/dL — ABNORMAL LOW (ref 8.9–10.3)
Chloride: 105 mmol/L (ref 98–111)
Creatinine, Ser: 1.08 mg/dL — ABNORMAL HIGH (ref 0.44–1.00)
GFR, Estimated: 60 mL/min — ABNORMAL LOW (ref >60)
Glucose, Bld: 100 mg/dL — ABNORMAL HIGH (ref 70–99)
Potassium: 3.8 mmol/L (ref 3.5–5.1)
Sodium: 139 mmol/L (ref 135–145)

## 2020-03-05 MED ORDER — HYDROCHLOROTHIAZIDE 50 MG PO TABS: 50.0000 mg | ORAL_TABLET | Freq: Every day | ORAL | 0 refills | Status: DC

## 2020-03-05 NOTE — Telephone Encounter (Signed)
Patient request refill on :  LOV 9/2/21virtual OV  hydrochlorothiazide (HYDRODIURIL) 50 MG tablet [[id number]]   Order Details Dose: 50 mg Route: Oral Frequency: Daily  Dispense Quantity: 30 tablet Refills: 1       Sig: Take 1 tablet (50 mg total) by mouth daily.       Pt uses :   [facility]  [place]  [place], [place] [address]  Phone:  [phone] Fax:  [phone]   --glh

## 2020-03-05 NOTE — Progress Notes (Signed)

## 2020-03-06 ENCOUNTER — Other Ambulatory Visit: Payer: Self-pay | Admitting: Physician Assistant

## 2020-03-06 DIAGNOSIS — I10 Essential (primary) hypertension: Secondary | ICD-10-CM

## 2020-03-07 ENCOUNTER — Ambulatory Visit (HOSPITAL_BASED_OUTPATIENT_CLINIC_OR_DEPARTMENT_OTHER)
Admission: RE | Admit: 2020-03-07 | Discharge: 2020-03-07 | Disposition: A | Discharge status: Home / Self Care | Payer: Medicaid Other | Attending: Orthopaedic Surgery | Admitting: Orthopaedic Surgery

## 2020-03-07 ENCOUNTER — Ambulatory Visit (HOSPITAL_BASED_OUTPATIENT_CLINIC_OR_DEPARTMENT_OTHER): Payer: Medicaid Other | Admitting: Certified Registered"

## 2020-03-07 ENCOUNTER — Other Ambulatory Visit: Payer: Self-pay

## 2020-03-07 ENCOUNTER — Encounter (HOSPITAL_BASED_OUTPATIENT_CLINIC_OR_DEPARTMENT_OTHER): Payer: Self-pay | Admitting: Orthopaedic Surgery

## 2020-03-07 ENCOUNTER — Encounter (HOSPITAL_BASED_OUTPATIENT_CLINIC_OR_DEPARTMENT_OTHER)
Admission: RE | Disposition: A | Discharge status: Home / Self Care | Payer: Self-pay | Source: Home / Self Care | Attending: Orthopaedic Surgery

## 2020-03-07 DIAGNOSIS — Z801 Family history of malignant neoplasm of trachea, bronchus and lung: Secondary | ICD-10-CM | POA: Diagnosis not present

## 2020-03-07 DIAGNOSIS — M797 Fibromyalgia: Secondary | ICD-10-CM | POA: Insufficient documentation

## 2020-03-07 DIAGNOSIS — M9689 Other intraoperative and postprocedural complications and disorders of the musculoskeletal system: Secondary | ICD-10-CM | POA: Insufficient documentation

## 2020-03-07 DIAGNOSIS — Z885 Allergy status to narcotic agent status: Secondary | ICD-10-CM | POA: Diagnosis not present

## 2020-03-07 DIAGNOSIS — I10 Essential (primary) hypertension: Secondary | ICD-10-CM | POA: Insufficient documentation

## 2020-03-07 DIAGNOSIS — K219 Gastro-esophageal reflux disease without esophagitis: Secondary | ICD-10-CM | POA: Diagnosis not present

## 2020-03-07 DIAGNOSIS — M1990 Unspecified osteoarthritis, unspecified site: Secondary | ICD-10-CM | POA: Insufficient documentation

## 2020-03-07 DIAGNOSIS — F419 Anxiety disorder, unspecified: Secondary | ICD-10-CM | POA: Insufficient documentation

## 2020-03-07 DIAGNOSIS — Z833 Family history of diabetes mellitus: Secondary | ICD-10-CM | POA: Diagnosis not present

## 2020-03-07 DIAGNOSIS — R0602 Shortness of breath: Secondary | ICD-10-CM | POA: Diagnosis not present

## 2020-03-07 DIAGNOSIS — F32A Depression, unspecified: Secondary | ICD-10-CM | POA: Diagnosis not present

## 2020-03-07 DIAGNOSIS — Z79899 Other long term (current) drug therapy: Secondary | ICD-10-CM | POA: Diagnosis not present

## 2020-03-07 DIAGNOSIS — M1812 Unilateral primary osteoarthritis of first carpometacarpal joint, left hand: Secondary | ICD-10-CM | POA: Diagnosis present

## 2020-03-07 DIAGNOSIS — Y838 Other surgical procedures as the cause of abnormal reaction of the patient, or of later complication, without mention of misadventure at the time of the procedure: Secondary | ICD-10-CM | POA: Insufficient documentation

## 2020-03-07 DIAGNOSIS — Z888 Allergy status to other drugs, medicaments and biological substances status: Secondary | ICD-10-CM | POA: Insufficient documentation

## 2020-03-07 DIAGNOSIS — Z791 Long term (current) use of non-steroidal anti-inflammatories (NSAID): Secondary | ICD-10-CM | POA: Insufficient documentation

## 2020-03-07 DIAGNOSIS — Z6841 Body Mass Index (BMI) 40.0 and over, adult: Secondary | ICD-10-CM | POA: Diagnosis not present

## 2020-03-07 DIAGNOSIS — E785 Hyperlipidemia, unspecified: Secondary | ICD-10-CM | POA: Insufficient documentation

## 2020-03-07 DIAGNOSIS — Z96653 Presence of artificial knee joint, bilateral: Secondary | ICD-10-CM | POA: Insufficient documentation

## 2020-03-07 DIAGNOSIS — Z803 Family history of malignant neoplasm of breast: Secondary | ICD-10-CM | POA: Insufficient documentation

## 2020-03-07 DIAGNOSIS — T8189XA Other complications of procedures, not elsewhere classified, initial encounter: Secondary | ICD-10-CM | POA: Diagnosis present

## 2020-03-07 HISTORY — DX: Unspecified osteoarthritis, unspecified site: M19.90

## 2020-03-07 HISTORY — DX: Depression, unspecified: F32.A

## 2020-03-07 HISTORY — PX: CARPOMETACARPEL SUSPENSION PLASTY: SHX5005

## 2020-03-07 HISTORY — DX: Anemia, unspecified: D64.9

## 2020-03-07 SURGERY — CARPOMETACARPEL (CMC) SUSPENSION PLASTY
Anesthesia: Monitor Anesthesia Care | Site: Wrist | Laterality: Left

## 2020-03-07 MED ORDER — FENTANYL CITRATE (PF) 100 MCG/2ML IJ SOLN
INTRAMUSCULAR | Status: DC | PRN
  Administered 2020-03-07: 50 ug via INTRAVENOUS

## 2020-03-07 MED ORDER — OXYCODONE-ACETAMINOPHEN 5-325 MG PO TABS: 1.0000 | ORAL_TABLET | Freq: Two times a day (BID) | ORAL | 0 refills | Status: DC | PRN

## 2020-03-07 MED ORDER — LACTATED RINGERS IV SOLN: INTRAVENOUS | Status: DC

## 2020-03-07 MED ORDER — BUPIVACAINE-EPINEPHRINE (PF) 0.5% -1:200000 IJ SOLN
INTRAMUSCULAR | Status: DC | PRN
  Administered 2020-03-07: 30 mL via PERINEURAL

## 2020-03-07 MED ORDER — LIDOCAINE-EPINEPHRINE (PF) 1.5 %-1:200000 IJ SOLN
INTRAMUSCULAR | Status: DC | PRN
  Administered 2020-03-07: 10 mL via PERINEURAL

## 2020-03-07 MED ORDER — ACETAMINOPHEN 10 MG/ML IV SOLN: 1000.0000 mg | Freq: Once | INTRAVENOUS | Status: DC | PRN

## 2020-03-07 MED ORDER — OXYCODONE HCL 5 MG/5ML PO SOLN: 5.0000 mg | Freq: Once | ORAL | Status: DC | PRN

## 2020-03-07 MED ORDER — DEXAMETHASONE SODIUM PHOSPHATE 10 MG/ML IJ SOLN
INTRAMUSCULAR | Status: DC | PRN
  Administered 2020-03-07: 10 mg via INTRAVENOUS

## 2020-03-07 MED ORDER — PROPOFOL 10 MG/ML IV BOLUS
INTRAVENOUS | Status: AC
  Filled 2020-03-07: qty 20

## 2020-03-07 MED ORDER — ONDANSETRON HCL 4 MG/2ML IJ SOLN
INTRAMUSCULAR | Status: AC
  Filled 2020-03-07: qty 2

## 2020-03-07 MED ORDER — FENTANYL CITRATE (PF) 100 MCG/2ML IJ SOLN
100.0000 ug | Freq: Once | INTRAMUSCULAR | Status: AC
  Administered 2020-03-07: 100 ug via INTRAVENOUS

## 2020-03-07 MED ORDER — DEXAMETHASONE SODIUM PHOSPHATE 10 MG/ML IJ SOLN
INTRAMUSCULAR | Status: AC
  Filled 2020-03-07: qty 1

## 2020-03-07 MED ORDER — FENTANYL CITRATE (PF) 100 MCG/2ML IJ SOLN: 50.0000 ug | Freq: Once | INTRAMUSCULAR | Status: DC

## 2020-03-07 MED ORDER — LIDOCAINE HCL (CARDIAC) PF 100 MG/5ML IV SOSY
PREFILLED_SYRINGE | INTRAVENOUS | Status: DC | PRN
  Administered 2020-03-07: 50 mg via INTRAVENOUS

## 2020-03-07 MED ORDER — FENTANYL CITRATE (PF) 100 MCG/2ML IJ SOLN
INTRAMUSCULAR | Status: AC
Start: 2020-03-07 — End: ?
  Filled 2020-03-07: qty 2

## 2020-03-07 MED ORDER — CEFAZOLIN SODIUM-DEXTROSE 2-4 GM/100ML-% IV SOLN
2.0000 g | INTRAVENOUS | Status: AC
  Administered 2020-03-07: 2 g via INTRAVENOUS

## 2020-03-07 MED ORDER — PROPOFOL 500 MG/50ML IV EMUL
INTRAVENOUS | Status: DC | PRN
  Administered 2020-03-07: 75 ug/kg/min via INTRAVENOUS

## 2020-03-07 MED ORDER — CEFAZOLIN SODIUM-DEXTROSE 2-4 GM/100ML-% IV SOLN
INTRAVENOUS | Status: AC
  Filled 2020-03-07: qty 100

## 2020-03-07 MED ORDER — LIDOCAINE 2% (20 MG/ML) 5 ML SYRINGE
INTRAMUSCULAR | Status: AC
  Filled 2020-03-07: qty 10

## 2020-03-07 MED ORDER — CLONIDINE HCL (ANALGESIA) 100 MCG/ML EP SOLN
EPIDURAL | Status: DC | PRN
  Administered 2020-03-07: 100 ug

## 2020-03-07 MED ORDER — ACETAMINOPHEN 500 MG PO TABS: 1000.0000 mg | ORAL_TABLET | Freq: Once | ORAL | Status: DC | PRN

## 2020-03-07 MED ORDER — ONDANSETRON HCL 4 MG/2ML IJ SOLN
INTRAMUSCULAR | Status: DC | PRN
  Administered 2020-03-07: 4 mg via INTRAVENOUS

## 2020-03-07 MED ORDER — MIDAZOLAM HCL 2 MG/2ML IJ SOLN
INTRAMUSCULAR | Status: AC
  Filled 2020-03-07: qty 2

## 2020-03-07 MED ORDER — OXYCODONE HCL 5 MG PO TABS: 5.0000 mg | ORAL_TABLET | Freq: Once | ORAL | Status: DC | PRN

## 2020-03-07 MED ORDER — MIDAZOLAM HCL 2 MG/2ML IJ SOLN
2.0000 mg | Freq: Once | INTRAMUSCULAR | Status: AC
  Administered 2020-03-07: 2 mg via INTRAVENOUS

## 2020-03-07 MED ORDER — FENTANYL CITRATE (PF) 100 MCG/2ML IJ SOLN: 25.0000 ug | INTRAMUSCULAR | Status: DC | PRN

## 2020-03-07 MED ORDER — FENTANYL CITRATE (PF) 100 MCG/2ML IJ SOLN
INTRAMUSCULAR | Status: AC
  Filled 2020-03-07: qty 2

## 2020-03-07 MED ORDER — ACETAMINOPHEN 160 MG/5ML PO SOLN: 1000.0000 mg | Freq: Once | ORAL | Status: DC | PRN

## 2020-03-07 SURGICAL SUPPLY — 78 items
BAND INSRT 18 STRL LF DISP RB (MISCELLANEOUS) ×1
BAND RUBBER #18 3X1/16 STRL (MISCELLANEOUS) ×3 IMPLANT
BIT DRILL PASSING CMC 1/4 FLEX (BIT) IMPLANT
BLADE MINI RND TIP GREEN BEAV (BLADE) ×3 IMPLANT
BLADE SURG 15 STRL LF DISP TIS (BLADE) ×2 IMPLANT
BLADE SURG 15 STRL SS (BLADE) ×6
BNDG CMPR 9X4 STRL LF SNTH (GAUZE/BANDAGES/DRESSINGS) ×1
BNDG ELASTIC 3X5.8 VLCR STR LF (GAUZE/BANDAGES/DRESSINGS) ×3 IMPLANT
BNDG ESMARK 4X9 LF (GAUZE/BANDAGES/DRESSINGS) ×3 IMPLANT
BRUSH SCRUB EZ PLAIN DRY (MISCELLANEOUS) ×3 IMPLANT
BUTTON ALL-SUT W/BACKSTOP (Orthopedic Implant) ×2 IMPLANT
CORD BIPOLAR FORCEPS 12FT (ELECTRODE) ×3 IMPLANT
COVER BACK TABLE 60X90IN (DRAPES) ×3 IMPLANT
COVER WAND RF STERILE (DRAPES) IMPLANT
CUFF TOURN SGL QUICK 18X4 (TOURNIQUET CUFF) IMPLANT
DECANTER SPIKE VIAL GLASS SM (MISCELLANEOUS) IMPLANT
DRAPE EXTREMITY T 121X128X90 (DISPOSABLE) ×3 IMPLANT
DRAPE IMP U-DRAPE 54X76 (DRAPES) ×3 IMPLANT
DRAPE OEC MINIVIEW 54X84 (DRAPES) IMPLANT
DRAPE SURG 17X23 STRL (DRAPES) ×3 IMPLANT
DRILL PASSING CMC 1/4 FLEX (BIT) ×3
GAUZE 4X4 16PLY RFD (DISPOSABLE) IMPLANT
GAUZE SPONGE 4X4 12PLY STRL (GAUZE/BANDAGES/DRESSINGS) ×3 IMPLANT
GAUZE XEROFORM 1X8 LF (GAUZE/BANDAGES/DRESSINGS) ×3 IMPLANT
GLOVE BIO SURGEON STRL SZ7 (GLOVE) ×6 IMPLANT
GLOVE BIOGEL PI IND STRL 7.0 (GLOVE) ×1 IMPLANT
GLOVE BIOGEL PI INDICATOR 7.0 (GLOVE) ×2
GLOVE ECLIPSE 7.0 STRL STRAW (GLOVE) ×3 IMPLANT
GLOVE SKINSENSE NS SZ7.5 (GLOVE) ×2
GLOVE SKINSENSE STRL SZ7.5 (GLOVE) ×1 IMPLANT
GLOVE SURG SS PI 7.0 STRL IVOR (GLOVE) ×4 IMPLANT
GLOVE SURG SYN 7.5  E (GLOVE) ×6
GLOVE SURG SYN 7.5 E (GLOVE) ×2 IMPLANT
GLOVE SURG SYN 7.5 PF PI (GLOVE) ×2 IMPLANT
GOWN SRG XL LVL 4 BRTHBL STRL (GOWNS) ×1 IMPLANT
GOWN STRL NON-REIN XL LVL4 (GOWNS) ×3
GOWN STRL REIN XL XLG (GOWN DISPOSABLE) ×3 IMPLANT
GOWN STRL REUS W/ TWL LRG LVL3 (GOWN DISPOSABLE) ×1 IMPLANT
GOWN STRL REUS W/ TWL XL LVL3 (GOWN DISPOSABLE) ×1 IMPLANT
GOWN STRL REUS W/TWL LRG LVL3 (GOWN DISPOSABLE) ×3
GOWN STRL REUS W/TWL XL LVL3 (GOWN DISPOSABLE) ×3
NDL HYPO 25X1 1.5 SAFETY (NEEDLE) IMPLANT
NEEDLE HYPO 25X1 1.5 SAFETY (NEEDLE) IMPLANT
NS IRRIG 1000ML POUR BTL (IV SOLUTION) ×3 IMPLANT
PACK BASIN DAY SURGERY FS (CUSTOM PROCEDURE TRAY) ×3 IMPLANT
PAD CAST 3X4 CTTN HI CHSV (CAST SUPPLIES) ×1 IMPLANT
PADDING CAST ABS 3INX4YD NS (CAST SUPPLIES)
PADDING CAST ABS 4INX4YD NS (CAST SUPPLIES) ×2
PADDING CAST ABS COTTON 3X4 (CAST SUPPLIES) IMPLANT
PADDING CAST ABS COTTON 4X4 ST (CAST SUPPLIES) ×1 IMPLANT
PADDING CAST COTTON 3X4 STRL (CAST SUPPLIES) ×3
SHEET MEDIUM DRAPE 40X70 STRL (DRAPES) ×3 IMPLANT
SLEEVE SCD COMPRESS KNEE MED (MISCELLANEOUS) ×3 IMPLANT
SPLINT FIBERGLASS 3X35 (CAST SUPPLIES) ×2 IMPLANT
STOCKINETTE 4X48 STRL (DRAPES) ×3 IMPLANT
SUCTION FRAZIER HANDLE 10FR (MISCELLANEOUS)
SUCTION TUBE FRAZIER 10FR DISP (MISCELLANEOUS) IMPLANT
SUT ETHIBOND 0 MO6 C/R (SUTURE) IMPLANT
SUT ETHILON 4 0 PS 2 18 (SUTURE) ×3 IMPLANT
SUT FIBERWIRE #2 38 T-5 BLUE (SUTURE)
SUT FIBERWIRE 2-0 18 17.9 3/8 (SUTURE)
SUT MNCRL AB 4-0 PS2 18 (SUTURE) IMPLANT
SUT VIC AB 0 SH 27 (SUTURE) IMPLANT
SUT VIC AB 2-0 CT1 27 (SUTURE)
SUT VIC AB 2-0 CT1 TAPERPNT 27 (SUTURE) IMPLANT
SUT VIC AB 2-0 SH 27 (SUTURE) ×3
SUT VIC AB 2-0 SH 27XBRD (SUTURE) ×1 IMPLANT
SUTURE FIBERWR #2 38 T-5 BLUE (SUTURE) IMPLANT
SUTURE FIBERWR 2-0 18 17.9 3/8 (SUTURE) IMPLANT
SUTURE TAPE 1.3 FIBERLOP 20 ST (SUTURE) IMPLANT
SUTURETAPE 1.3 FIBERLOOP 20 ST (SUTURE)
SYR BULB EAR ULCER 3OZ GRN STR (SYRINGE) ×3 IMPLANT
SYR CONTROL 10ML LL (SYRINGE) IMPLANT
TOWEL GREEN STERILE FF (TOWEL DISPOSABLE) ×6 IMPLANT
TRAY DSU PREP LF (CUSTOM PROCEDURE TRAY) ×3 IMPLANT
TUBE CONNECTING 20'X1/4 (TUBING) ×1
TUBE CONNECTING 20X1/4 (TUBING) ×2 IMPLANT
UNDERPAD 30X36 HEAVY ABSORB (UNDERPADS AND DIAPERS) ×3 IMPLANT

## 2020-03-07 NOTE — Discharge Instructions (Signed)
Postoperative instructions:  Weightbearing instructions: non weight bearing  Dressing instructions: Keep your dressing and/or splint clean and dry at all times.  It will be removed at your first post-operative appointment.  Your stitches and/or staples will be removed at this visit.  Incision instructions:  Do not soak your incision for 3 weeks after surgery.  If the incision gets wet, pat dry and do not scrub the incision.  Pain control:  You have been given a prescription to be taken as directed for post-operative pain control.  In addition, elevate the operative extremity above the heart at all times to prevent swelling and throbbing pain.  Take over-the-counter Colace, 100mg  by mouth twice a day while taking narcotic pain medications to help prevent constipation.  Follow up appointments: 1) 14 days for suture removal and wound check. 2) Dr. Erlinda Hong as scheduled.   -------------------------------------------------------------------------------------------------------------  After Surgery Pain Control:  After your surgery, post-surgical discomfort or pain is likely. This discomfort can last several days to a few weeks. At certain times of the day your discomfort may be more intense.  Did you receive a nerve block?  A nerve block can provide pain relief for one hour to two days after your surgery. As long as the nerve block is working, you will experience little or no sensation in the area the surgeon operated on.  As the nerve block wears off, you will begin to experience pain or discomfort. It is very important that you begin taking your prescribed pain medication before the nerve block fully wears off. Treating your pain at the first sign of the block wearing off will ensure your pain is better controlled and more tolerable when full-sensation returns. Do not wait until the pain is intolerable, as the medicine will be less effective. It is better to treat pain in advance than to try and  catch up.  General Anesthesia:  If you did not receive a nerve block during your surgery, you will need to start taking your pain medication shortly after your surgery and should continue to do so as prescribed by your surgeon.  Pain Medication:  Most commonly we prescribe Vicodin and Percocet for post-operative pain. Both of these medications contain a combination of acetaminophen (Tylenol) and a narcotic to help control pain.   It takes between 30 and 45 minutes before pain medication starts to work. It is important to take your medication before your pain level gets too intense.   Nausea is a common side effect of many pain medications. You will want to eat something before taking your pain medicine to help prevent nausea.   If you are taking a prescription pain medication that contains acetaminophen, we recommend that you do not take additional over the counter acetaminophen (Tylenol).  Other pain relieving options:   Using a cold pack to ice the affected area a few times a day (15 to 20 minutes at a time) can help to relieve pain, reduce swelling and bruising.   Elevation of the affected area can also help to reduce pain and swelling.   Post Anesthesia Home Care Instructions  Activity: Get plenty of rest for the remainder of the day. A responsible individual must stay with you for 24 hours following the procedure.  For the next 24 hours, DO NOT: -Drive a car -Paediatric nurse -Drink alcoholic beverages -Take any medication unless instructed by your physician -Make any legal decisions or sign important papers.  Meals: Start with liquid foods such as gelatin or  soup. Progress to regular foods as tolerated. Avoid greasy, spicy, heavy foods. If nausea and/or vomiting occur, drink only clear liquids until the nausea and/or vomiting subsides. Call your physician if vomiting continues.  Special Instructions/Symptoms: Your throat may feel dry or sore from the anesthesia or the  breathing tube placed in your throat during surgery. If this causes discomfort, gargle with warm salt water. The discomfort should disappear within 24 hours.  If you had a scopolamine patch placed behind your ear for the management of post- operative nausea and/or vomiting:  1. The medication in the patch is effective for 72 hours, after which it should be removed.  Wrap patch in a tissue and discard in the trash. Wash hands thoroughly with soap and water. 2. You may remove the patch earlier than 72 hours if you experience unpleasant side effects which may include dry mouth, dizziness or visual disturbances. 3. Avoid touching the patch. Wash your hands with soap and water after contact with the patch.    Regional Anesthesia Blocks  1. Numbness or the inability to move the "blocked" extremity may last from 3-48 hours after placement. The length of time depends on the medication injected and your individual response to the medication. If the numbness is not going away after 48 hours, call your surgeon.  2. The extremity that is blocked will need to be protected until the numbness is gone and the  Strength has returned. Because you cannot feel it, you will need to take extra care to avoid injury. Because it may be weak, you may have difficulty moving it or using it. You may not know what position it is in without looking at it while the block is in effect.  3. For blocks in the legs and feet, returning to weight bearing and walking needs to be done carefully. You will need to wait until the numbness is entirely gone and the strength has returned. You should be able to move your leg and foot normally before you try and bear weight or walk. You will need someone to be with you when you first try to ensure you do not fall and possibly risk injury.  4. Bruising and tenderness at the needle site are common side effects and will resolve in a few days.  5. Persistent numbness or new problems with movement  should be communicated to the surgeon or the [facility] [phone] [place] [phone]).

## 2020-03-07 NOTE — Op Note (Signed)
   DATE OF SURGERY:03/07/2020  PREOPERATIVE DIAGNOSIS: Left thumb pain status post failed left thumb CMC arthroplasty.  POSTOPERATIVE DIAGNOSIS: same.  PROCEDURE:  1.  Removal of previous micro link device 2.  Revision left thumb CMC arthroplasty 3.  Ligament reconstruction interposition of left thumb CMC joint   IMPLANTS: Conmed microlink  SURGEON: N. Eduard Roux, MD  ASSIST: Madalyn Rob, PA-C; necessary for the timely completion of procedure and due to complexity of procedure.  ANESTHESIA: Axillary block, MAC  TOURNIQUET TIME: see anesthesia record.  BLOOD LOSS: Minimal.  COMPLICATIONS: None.  PATHOLOGY: None.  TIME OUT: A time out was performed before the procedure started.  INDICATIONS FOR PROCEDURE: [name] is a very pleasant 59 year old female who underwent left thumb CMC arthroplasty with micro link device approximately 2 years ago and had uneventful recovery.  She then presented to my office about a month ago with increasing left hand and base of thumb pain and x-rays revealed widening of the thumb metacarpal and second metacarpal space with failure of the micro link device.  The thumb metacarpal had subsided into the space left by the trapeziectomy.  Preoperative x-rays and CT scan revealed degenerative changes of the distal pole of the scaphoid and articulation between the base of the thumb metacarpal and the distal pole the scaphoid.  My recommendation was for revision [facility] arthroplasty.  Risk benefits alternatives discussed with the patient and informed consent was obtained for surgery.  DESCRIPTION OF PROCEDURE: Incision over the thumb metacarpal and first dorsal wrist compartment was incised.  Dissection was carried down through the subcutaneous tissue and scar.  The dorsal branch of the radial artery was identified in the expected anatomic spot and mobilized and retracted dorsally.  Dissection was then carried around the volar surface of the thumb metacarpal to  expose the suture button which was cut sharply with a 15 blade.  Separate incision was then made on the ulnar base of the second metacarpal through the previous surgical scar.  Dissection was taken down to the cortex of the metacarpal and the other end of the suture button was exposed and pulled out with the corresponding sutures without difficulty.  We then used a rondure to remove scar tissue within the space from the previous trapeziectomy.  The thumb metacarpal was mobilized and reduced back to its original position which was then confirmed under fluoroscopy.  The Shenton's line was restored.  We then used a separate path and drilled across the base of the second metacarpal into the first metacarpal using the provided drill guide.  The micro link device was passed through the drill hole and tightened.  I then created a third incision proximally 10 cm proximal to the wrist flexion crease directly over the FCR tendon.  The FCR tendon was then transected and brought out through the [facility] incision.  The FCR tendon was then rolled up into an anchovy and sutured with 2-0 Ethibond.  The anchovy was then placed into the space between the thumb metacarpal and the scaphoid.  The surgical wounds were then all thoroughly irrigated and closed in layered fashion.  Sterile dressings applied.  Thumb spica splint placed.  Patient tolerated procedure well had no immediate complications.  POSTOPERATIVE PLAN: Return in two weeks for suture removal and application of a thumb spica cast. Four weeks after surgery cast will be removed and switch to a thumb spica brace and start hand therapy.

## 2020-03-07 NOTE — H&P (Signed)
PREOPERATIVE H&P  Chief Complaint: arthrosis left first carpometacarpal joint  HPI: [name] is a 59 y.o. female who presents for surgical treatment of arthrosis left first carpometacarpal joint.  She denies any changes in medical history.  Past Medical History:  Diagnosis Date  . Acid reflux    takes Zantac and Omeprazole daily  . Anemia   . Anxiety    takes Citaopram daily  . Arthritis    right knee  . Arthrosis    left thumb CMC  . Chronic back pain    DDD  . Clotting disorder (HCC)    hx of blood clot following knee scope  . Depression   . History of bronchitis 3+yrs ago  . Hyperlipidemia    takes Atorvastatin daily  . Hypertension    takes Lisinopril and HCTZ daily  . Insomnia    takes Elavil nightly as needed  . Paraesophageal hernia    Past Surgical History:  Procedure Laterality Date  . BREAST BIOPSY Left   . BUNIONECTOMY Right 02/18/2018   Procedure: Yehuda Budd;  Surgeon: Edrick Kins, DPM;  Location: [place];  Service: Podiatry;  Laterality: Right;  . CAPSULOTOMY Bilateral 02/18/2018   Procedure: CAPSULOTOMY MPJ RELEASE JOINT 2N BILATERAL;  Surgeon: Edrick Kins, DPM;  Location: [place];  Service: Podiatry;  Laterality: Bilateral;  . CARPOMETACARPEL SUSPENSION PLASTY Left 01/27/2018   Procedure: LEFT THUMB ligament reconstruction and tendon interposition;  Surgeon: Leandrew Koyanagi, MD;  Location: [place];  Service: Orthopedics;  Laterality: Left;  . CHONDROPLASTY Right 06/28/2014   Procedure: CHONDROPLASTY;  Surgeon: Marianna Payment, MD;  Location: [place];  Service: Orthopedics;  Laterality: Right;  . COLONOSCOPY N/A 05/29/2014   Procedure: COLONOSCOPY;  Surgeon: Daneil Dolin, MD;  [facility];  Service: Endoscopy;  Laterality: N/A;  215pm- Pt is working until 12:00 so she can't come any earlier  . ESOPHAGOGASTRODUODENOSCOPY N/A 05/29/2014   Procedure: ESOPHAGOGASTRODUODENOSCOPY (EGD);  Surgeon:  Daneil Dolin, MD;  [facility];  Service: Endoscopy;  Laterality: N/A;  . GANGLION CYST EXCISION Left 01/13/2002  . HAMMER TOE SURGERY Bilateral 02/18/2018   Procedure: HAMMER TOE CORRECTION2ND BILATERAL;  Surgeon: Edrick Kins, DPM;  Location: [place];  Service: Podiatry;  Laterality: Bilateral;  . KNEE ARTHROSCOPY WITH MEDIAL MENISECTOMY Right 06/28/2014   Procedure: RIGHT KNEE ARTHROSCOPY WITH PARTIAL MEDIAL MENISCECTOMY AND CHONDROPLASTY;  Surgeon: Marianna Payment, MD;  Location: [place];  Service: Orthopedics;  Laterality: Right;  . MALONEY DILATION N/A 05/29/2014   Procedure: Venia Minks DILATION;  Surgeon: Daneil Dolin, MD;  [facility];  Service: Endoscopy;  Laterality: N/A;  . PARTIAL KNEE ARTHROPLASTY Right 09/15/2014   Procedure: RIGHT UNICOMPARTMENTAL KNEE ARTHROPLASTY;  Surgeon: Leandrew Koyanagi, MD;  Location: [place];  Service: Orthopedics;  Laterality: Right;  . TOTAL KNEE ARTHROPLASTY Left 04/08/2004   Social History   Socioeconomic History  . Marital status: Divorced    Spouse name: Not on file  . Number of children: Not on file  . Years of education: Not on file  . Highest education level: Not on file  Occupational History  . Occupation: works as Geneticist, molecular  . Smoking status: Never Smoker  . Smokeless tobacco: Never Used  Vaping Use  . Vaping Use: Never used  Substance and Sexual Activity  . Alcohol use: No    Alcohol/week: 0.0 standard drinks  .  Drug use: No  . Sexual activity: Not Currently    Birth control/protection: None, Post-menopausal  Other Topics Concern  . Not on file  Social History Narrative  . Not on file   Social Determinants of Health   Financial Resource Strain:   . Difficulty of Paying Living Expenses: Not on file  Food Insecurity:   . Worried About Charity fundraiser in the Last Year: Not on file  . Ran Out of Food in the Last Year: Not on file  Transportation Needs:   . Lack  of Transportation (Medical): Not on file  . Lack of Transportation (Non-Medical): Not on file  Physical Activity:   . Days of Exercise per Week: Not on file  . Minutes of Exercise per Session: Not on file  Stress:   . Feeling of Stress : Not on file  Social Connections:   . Frequency of Communication with Friends and Family: Not on file  . Frequency of Social Gatherings with Friends and Family: Not on file  . Attends Religious Services: Not on file  . Active Member of Clubs or Organizations: Not on file  . Attends Archivist Meetings: Not on file  . Marital Status: Not on file   Family History  Problem Relation Age of Onset  . Cancer Father        lung  . Cancer Sister        breast  . Breast cancer Sister 44  . Cancer Maternal Grandfather        lung  . Cancer Paternal Grandfather        lung  . Diabetes Son   . Colon cancer Neg Hx    Allergies  Allergen Reactions  . Tramadol Itching    "bugs crawling all over"   Prior to Admission medications   Medication Sig Start Date End Date Taking? Authorizing Provider  DULoxetine (CYMBALTA) 30 MG capsule Take 3 capsules (90 mg total) by mouth daily. 08/29/19  Yes Abonza, Maritza, PA-C  FLUoxetine (PROZAC) 20 MG tablet Take 0.5 (10mg ) tablet once daily for 7 days. Then take 1 tablet once daily. 11/02/19  Yes Lorrene Reid, PA-C  lisinopril (ZESTRIL) 40 MG tablet Take 1 Tablet by mouth Daily 12/28/19  Yes Abonza, Maritza, PA-C  meloxicam (MOBIC) 15 MG tablet Take 1 tablet (15 mg total) by mouth daily. 01/30/20  Yes Edrick Kins, DPM  omeprazole (PRILOSEC) 20 MG capsule Take 1 capsule (20 mg total) by mouth daily. 08/29/19  Yes Abonza, Maritza, PA-C  ferrous sulfate 325 (65 FE) MG EC tablet Take 1 tablet (325 mg total) by mouth 2 (two) times daily. 08/22/19 08/21/20  Opalski, Neoma Laming, DO  hydrochlorothiazide (HYDRODIURIL) 50 MG tablet Take 1 tablet (50 mg total) by mouth daily. 03/05/20   Lorrene Reid, PA-C     Positive  ROS: All other systems have been reviewed and were otherwise negative with the exception of those mentioned in the HPI and as above.  Physical Exam: General: Alert, no acute distress Cardiovascular: No pedal edema Respiratory: No cyanosis, no use of accessory musculature GI: abdomen soft Skin: No lesions in the area of chief complaint Neurologic: Sensation intact distally Psychiatric: Patient is competent for consent with normal mood and affect Lymphatic: no lymphedema  MUSCULOSKELETAL: exam stable  Assessment: arthrosis left first carpometacarpal joint  Plan: Plan for Procedure(s): REVISION LEFT THUMB CARPOMETACARPAL (CMC) ARTHROPLASTY  The risks benefits and alternatives were discussed with the patient including but not limited to the risks  of nonoperative treatment, versus surgical intervention including infection, bleeding, nerve injury,  blood clots, cardiopulmonary complications, morbidity, mortality, among others, and they were willing to proceed.   Preoperative templating of the joint replacement has been completed, documented, and submitted to the Operating Room personnel in order to optimize intra-operative equipment management.   Eduard Roux, MD 03/07/2020 7:21 AM

## 2020-03-07 NOTE — Transfer of Care (Signed)
Immediate Anesthesia Transfer of Care Note  Patient: [name]  Procedure(s) Performed: REVISION LEFT THUMB CARPOMETACARPAL ([place]) ARTHROPLASTY (Left Wrist)  Patient Location: PACU  Anesthesia Type:MAC and Regional  Level of Consciousness: awake, alert  and oriented  Airway & Oxygen Therapy: Patient Spontanous Breathing  Post-op Assessment: Report given to RN and Post -op Vital signs reviewed and stable  Post vital signs: Reviewed and stable  Last Vitals:  Vitals Value Taken Time  BP 107/60 03/07/20 1342  Temp    Pulse 84 03/07/20 1344  Resp 15 03/07/20 1344  SpO2 94 % 03/07/20 1344  Vitals shown include unvalidated device data.  Last Pain:  Vitals:   03/07/20 1028  TempSrc: Oral  PainSc: 0-No pain         Complications: No complications documented.

## 2020-03-07 NOTE — Anesthesia Postprocedure Evaluation (Signed)
Anesthesia Post Note  Patient: [name]  Procedure(s) Performed: REVISION LEFT THUMB CARPOMETACARPAL ([place]) ARTHROPLASTY (Left Wrist)     Patient location during evaluation: PACU Anesthesia Type: Regional and MAC Level of consciousness: awake and alert Pain management: pain level controlled Vital Signs Assessment: post-procedure vital signs reviewed and stable Respiratory status: spontaneous breathing, nonlabored ventilation, respiratory function stable and patient connected to nasal cannula oxygen Cardiovascular status: stable and blood pressure returned to baseline Postop Assessment: no apparent nausea or vomiting Anesthetic complications: no   No complications documented.  Last Vitals:  Vitals:   03/07/20 1345 03/07/20 1436  BP: 101/61 111/70  Pulse: 84 80  Resp: 15 18  Temp:  36.8 C  SpO2: 94% 95%    Last Pain:  Vitals:   03/07/20 1436  TempSrc:   PainSc: 0-No pain                 Chanese Hartsough

## 2020-03-07 NOTE — Anesthesia Procedure Notes (Signed)
Anesthesia Regional Block: Axillary brachial plexus block   Pre-Anesthetic Checklist: ,, timeout performed, Correct Patient, Correct Site, Correct Laterality, Correct Procedure, Correct Position, site marked, Risks and benefits discussed,  Surgical consent,  Pre-op evaluation,  At surgeon's request and post-op pain management  Laterality: Left and Upper  Prep: chloraprep       Needles:  Injection technique: Single-shot     Needle Length: 9cm  Needle Gauge: 22     Additional Needles: Arrow StimuQuik ECHO Echogenic Stimulating PNB Needle  Procedures:,,,, ultrasound used (permanent image in chart),,,,  Narrative:  Start time: 03/07/2020 11:58 AM End time: 03/07/2020 12:05 PM Injection made incrementally with aspirations every 5 mL.  Performed by: Personally  Anesthesiologist: Oleta Mouse, MD

## 2020-03-07 NOTE — Anesthesia Preprocedure Evaluation (Signed)
Anesthesia Evaluation  Patient identified by MRN, date of birth, ID band Patient awake    Reviewed: Allergy & Precautions, NPO status , Patient's Chart, lab work & pertinent test results  History of Anesthesia Complications Negative for: history of anesthetic complications  Airway Mallampati: III  TM Distance: >3 FB Neck ROM: Full    Dental  (+) Dental Advisory Given   Pulmonary shortness of breath,  Covid-19 Nucleic Acid Test Results Lab Results      Component                Value               Date                      [place]              NEGATIVE            03/05/2020                [place]              NEGATIVE            02/28/2019              breath sounds clear to auscultation       Cardiovascular hypertension, Pt. on medications (-) angina(-) Past MI and (-) CHF (-) dysrhythmias  Rhythm:Regular     Neuro/Psych neg Seizures PSYCHIATRIC DISORDERS Anxiety Depression  Neuromuscular disease    GI/Hepatic Neg liver ROS, hiatal hernia, GERD  Medicated and Controlled,  Endo/Other  Morbid obesity  Renal/GU CRFRenal diseaseLab Results      Component                Value               Date                      CREATININE               1.08 (H)            03/05/2020           Lab Results      Component                Value               Date                      K                        3.8                 03/05/2020                Musculoskeletal  (+) Arthritis , Fibromyalgia -  Abdominal   Peds  Hematology  (+) Blood dyscrasia, anemia , Lab Results      Component                Value               Date                      WBC                      7.2  11/15/2019                HGB                      10.8 (L)            11/15/2019                HCT                      33.4 (L)            11/15/2019                MCV                      84                  11/15/2019                 PLT                      445                 11/15/2019              Anesthesia Other Findings   Reproductive/Obstetrics                             Anesthesia Physical Anesthesia Plan  ASA: III  Anesthesia Plan: MAC and Regional   Post-op Pain Management:    Induction: Intravenous  PONV Risk Score and Plan: 2 and Propofol infusion and Treatment may vary due to age or medical condition  Airway Management Planned: Nasal Cannula  Additional Equipment: None  Intra-op Plan:   Post-operative Plan:   Informed Consent: I have reviewed the patients History and Physical, chart, labs and discussed the procedure including the risks, benefits and alternatives for the proposed anesthesia with the patient or authorized representative who has indicated his/her understanding and acceptance.     Dental advisory given  Plan Discussed with: CRNA and Surgeon  Anesthesia Plan Comments:         Anesthesia Quick Evaluation

## 2020-03-07 NOTE — Progress Notes (Signed)
Assisted Dr. Moser with left, ultrasound guided, axillary block. Side rails up, monitors on throughout procedure. See vital signs in flow sheet. Tolerated Procedure well. ° °

## 2020-03-08 ENCOUNTER — Encounter (HOSPITAL_BASED_OUTPATIENT_CLINIC_OR_DEPARTMENT_OTHER): Payer: Self-pay | Admitting: Orthopaedic Surgery

## 2020-03-20 ENCOUNTER — Ambulatory Visit (INDEPENDENT_AMBULATORY_CARE_PROVIDER_SITE_OTHER): Payer: Self-pay | Admitting: Orthopaedic Surgery

## 2020-03-20 ENCOUNTER — Other Ambulatory Visit: Payer: Self-pay

## 2020-03-20 ENCOUNTER — Ambulatory Visit (INDEPENDENT_AMBULATORY_CARE_PROVIDER_SITE_OTHER): Payer: Self-pay

## 2020-03-20 ENCOUNTER — Encounter: Payer: Self-pay | Admitting: Orthopaedic Surgery

## 2020-03-20 DIAGNOSIS — M79642 Pain in left hand: Secondary | ICD-10-CM

## 2020-03-20 NOTE — Progress Notes (Signed)
Post-Op Visit Note   Patient: [name]           Date of Birth: [birth year]           MRN: [id number] Visit Date: 03/20/2020 PCP: Lorrene Reid, PA-C   Assessment & Plan:  Chief Complaint:  Chief Complaint  Patient presents with  . Left Hand - Pain   Visit Diagnoses:  1. Pain in left hand     Plan: Patient is a pleasant 59 year old female who presents to our clinic today 12 days out revision left thumb CMC arthroplasty.  She has been doing well.  She has been taking meloxicam for pain.  Examination of her left hand reveals well-healing surgical incisions without complication.  Fingers are warm and well-perfused.  She is neurovascular intact distally.  At this point, sutures were removed and Steri-Strips applied.  We will place her in a thumb spica cast nonweightbearing.  She will follow up with [place] in 2 weeks time for repeat evaluation.  Follow-Up Instructions: Return in about 2 weeks (around 04/03/2020).   Orders:  Orders Placed This Encounter  Procedures  . XR Hand Complete Left   No orders of the defined types were placed in this encounter.   Imaging: XR Hand Complete Left  Result Date: 03/20/2020 Status post revision CMC arthroplasty.  No complications with the implant.   PMFS History: Patient Active Problem List   Diagnosis Date Noted  . Anxiety 11/02/2019  . B12 deficiency 11/02/2019  . Adjustment disorder with mixed anxiety and depressed mood 08/22/2019  . Iron deficiency anemia 08/22/2019  . Gastroesophageal reflux disease 08/22/2019  . Chronic joint pain 08/22/2019  . Stage 3 chronic kidney disease ([place]) 08/22/2019  . Obesity (BMI 35.0-39.9 without comorbidity) 02/28/2019  . Normocytic anemia, not due to blood loss 02/28/2019  . Restless legs syndrome (RLS) 06/30/2018  . Arthrosis of first carpometacarpal joint 02/11/2018  . Primary osteoarthritis of first carpometacarpal joint of left hand   . Pain in left hand 12/23/2017  . Healthcare maintenance  11/16/2017  . Tinea versicolor 11/16/2017  . Chronic right shoulder pain 09/02/2017  . Fibromyalgia 08/11/2017  . Family history of diabetes mellitus 06/23/2016  . Other fatigue 06/23/2016  . Insomnia 06/23/2016  . Rheumatoid arthritis ([place]) 06/23/2016  . Status post right partial knee replacement 09/15/2014  . Status post total knee replacement using cement 06/08/2014  . Screening for colon cancer   . Schatzki's ring   . Hiatal hernia   . Dysphagia, pharyngoesophageal phase 05/26/2014  . Encounter for screening colonoscopy 05/26/2014  . Adrenal adenoma 05/01/2014  . Breast lump 05/01/2014  . Metatarsalgia of both feet 01/12/2014  . Bilateral leg pain 01/11/2014  . Bilateral edema of lower extremity 01/11/2014  . Other osteoarthritis of spine, thoracolumbar region 12/26/2013  . Hypercholesteremia 05/19/2013  . Periodic health assessment, general screening, adult 04/13/2013  . GERD (gastroesophageal reflux disease) 04/13/2013  . Essential hypertension 04/13/2013  . Dyspnea 04/13/2013   Past Medical History:  Diagnosis Date  . Acid reflux    takes Zantac and Omeprazole daily  . Anemia   . Anxiety    takes Citaopram daily  . Arthritis    right knee  . Arthrosis    left thumb CMC  . Chronic back pain    DDD  . Clotting disorder (HCC)    hx of blood clot following knee scope  . Depression   . History of bronchitis 3+yrs ago  . Hyperlipidemia    takes  Atorvastatin daily  . Hypertension    takes Lisinopril and HCTZ daily  . Insomnia    takes Elavil nightly as needed  . Paraesophageal hernia     Family History  Problem Relation Age of Onset  . Cancer Father        lung  . Cancer Sister        breast  . Breast cancer Sister 51  . Cancer Maternal Grandfather        lung  . Cancer Paternal Grandfather        lung  . Diabetes Son   . Colon cancer Neg Hx     Past Surgical History:  Procedure Laterality Date  . BREAST BIOPSY Left   . BUNIONECTOMY Right  02/18/2018   Procedure: Yehuda Budd;  Surgeon: Edrick Kins, DPM;  Location: [place];  Service: Podiatry;  Laterality: Right;  . CAPSULOTOMY Bilateral 02/18/2018   Procedure: CAPSULOTOMY MPJ RELEASE JOINT 2N BILATERAL;  Surgeon: Edrick Kins, DPM;  Location: [place];  Service: Podiatry;  Laterality: Bilateral;  . CARPOMETACARPEL SUSPENSION PLASTY Left 01/27/2018   Procedure: LEFT THUMB ligament reconstruction and tendon interposition;  Surgeon: Leandrew Koyanagi, MD;  Location: [place];  Service: Orthopedics;  Laterality: Left;  . CARPOMETACARPEL SUSPENSION PLASTY Left 03/07/2020   Procedure: REVISION LEFT THUMB CARPOMETACARPAL ([place]) ARTHROPLASTY;  Surgeon: Leandrew Koyanagi, MD;  Location: [place];  Service: Orthopedics;  Laterality: Left;  . CHONDROPLASTY Right 06/28/2014   Procedure: CHONDROPLASTY;  Surgeon: Marianna Payment, MD;  Location: [place];  Service: Orthopedics;  Laterality: Right;  . COLONOSCOPY N/A 05/29/2014   Procedure: COLONOSCOPY;  Surgeon: Daneil Dolin, MD;  [facility];  Service: Endoscopy;  Laterality: N/A;  215pm- Pt is working until 12:00 so she can't come any earlier  . ESOPHAGOGASTRODUODENOSCOPY N/A 05/29/2014   Procedure: ESOPHAGOGASTRODUODENOSCOPY (EGD);  Surgeon: Daneil Dolin, MD;  [facility];  Service: Endoscopy;  Laterality: N/A;  . GANGLION CYST EXCISION Left 01/13/2002  . HAMMER TOE SURGERY Bilateral 02/18/2018   Procedure: HAMMER TOE CORRECTION2ND BILATERAL;  Surgeon: Edrick Kins, DPM;  Location: [place];  Service: Podiatry;  Laterality: Bilateral;  . KNEE ARTHROSCOPY WITH MEDIAL MENISECTOMY Right 06/28/2014   Procedure: RIGHT KNEE ARTHROSCOPY WITH PARTIAL MEDIAL MENISCECTOMY AND CHONDROPLASTY;  Surgeon: Marianna Payment, MD;  Location: [place];  Service: Orthopedics;  Laterality: Right;  . MALONEY DILATION N/A 05/29/2014   Procedure: Venia Minks DILATION;  Surgeon:  Daneil Dolin, MD;  [facility];  Service: Endoscopy;  Laterality: N/A;  . PARTIAL KNEE ARTHROPLASTY Right 09/15/2014   Procedure: RIGHT UNICOMPARTMENTAL KNEE ARTHROPLASTY;  Surgeon: Leandrew Koyanagi, MD;  Location: [place];  Service: Orthopedics;  Laterality: Right;  . SHOULDER ARTHROSCOPY Right   . TOTAL KNEE ARTHROPLASTY Left 04/08/2004   Social History   Occupational History  . Occupation: works as Geneticist, molecular  . Smoking status: Never Smoker  . Smokeless tobacco: Never Used  Vaping Use  . Vaping Use: Never used  Substance and Sexual Activity  . Alcohol use: No    Alcohol/week: 0.0 standard drinks  . Drug use: No  . Sexual activity: Not Currently    Birth control/protection: None, Post-menopausal

## 2020-03-23 ENCOUNTER — Other Ambulatory Visit: Payer: Self-pay | Admitting: Physician Assistant

## 2020-03-26 ENCOUNTER — Telehealth: Payer: Self-pay | Admitting: Orthopaedic Surgery

## 2020-03-26 NOTE — Telephone Encounter (Signed)
That is fine as long as she will can wear the thumb spica brace at all times.

## 2020-03-26 NOTE — Telephone Encounter (Signed)
Pt called stating she has a concern about her cast and would like to speak with lis about it.   [phone]

## 2020-03-26 NOTE — Telephone Encounter (Signed)
Patient states She is scheduled Next week 11/23 Cast Removal. Would like to go to [facility] with Daughter-family this weekend.Would like to come in Friday instead for cast removal. Can come in Friday AM. Will this be okay to do?

## 2020-03-27 ENCOUNTER — Ambulatory Visit: Payer: Self-pay | Admitting: Orthopaedic Surgery

## 2020-03-27 NOTE — Telephone Encounter (Signed)
She states yes and will bring in brace at her visit Friday.  Appt made.

## 2020-03-30 ENCOUNTER — Encounter: Payer: Self-pay | Admitting: Orthopaedic Surgery

## 2020-03-30 ENCOUNTER — Ambulatory Visit (INDEPENDENT_AMBULATORY_CARE_PROVIDER_SITE_OTHER): Payer: Self-pay

## 2020-03-30 ENCOUNTER — Ambulatory Visit (INDEPENDENT_AMBULATORY_CARE_PROVIDER_SITE_OTHER): Payer: Self-pay | Admitting: Orthopaedic Surgery

## 2020-03-30 ENCOUNTER — Other Ambulatory Visit: Payer: Self-pay

## 2020-03-30 DIAGNOSIS — M79642 Pain in left hand: Secondary | ICD-10-CM

## 2020-03-30 NOTE — Progress Notes (Signed)
Post-Op Visit Note   Patient: [name]           Date of Birth: 25-Sep-1960           MRN: [id number] Visit Date: 03/30/2020 PCP: Lorrene Reid, PA-C   Assessment & Plan:  Chief Complaint:  Chief Complaint  Patient presents with  . Left Hand - Pain   Visit Diagnoses:  1. Pain in left hand     Plan: Patient is a pleasant 59 year old female who is 3-week status post left revision CMC arthroplasty, 03/07/2020.  She has been doing well.  She has minimal pain.  She has a little early to her appointment today as she is heading out of town next week for Thanksgiving.  Examination of her left hand reveals well-healed surgical incisions without complication.  Minimal swelling.  Fingers are warm well perfused.  She is neurovascular intact distally.  At this point, we are okay with [place] transitioning into a thumb spica splint that will be worn at all times.  She may come out of this starting next week to work on her home exercises.  She will follow up with [place] in 3 weeks time when she is 6 weeks out from surgery.  Call with concerns or questions in the meantime.  Follow-Up Instructions: Return in about 3 weeks (around 04/20/2020).   Orders:  Orders Placed This Encounter  Procedures  . XR Hand Complete Left   No orders of the defined types were placed in this encounter.   Imaging: XR Hand Complete Left  Result Date: 03/30/2020 X-rays demonstrate stable implant without complications   PMFS History: Patient Active Problem List   Diagnosis Date Noted  . Anxiety 11/02/2019  . B12 deficiency 11/02/2019  . Adjustment disorder with mixed anxiety and depressed mood 08/22/2019  . Iron deficiency anemia 08/22/2019  . Gastroesophageal reflux disease 08/22/2019  . Chronic joint pain 08/22/2019  . Stage 3 chronic kidney disease ([place]) 08/22/2019  . Obesity (BMI 35.0-39.9 without comorbidity) 02/28/2019  . Normocytic anemia, not due to blood loss 02/28/2019  . Restless legs syndrome  (RLS) 06/30/2018  . Arthrosis of first carpometacarpal joint 02/11/2018  . Primary osteoarthritis of first carpometacarpal joint of left hand   . Pain in left hand 12/23/2017  . Healthcare maintenance 11/16/2017  . Tinea versicolor 11/16/2017  . Chronic right shoulder pain 09/02/2017  . Fibromyalgia 08/11/2017  . Family history of diabetes mellitus 06/23/2016  . Other fatigue 06/23/2016  . Insomnia 06/23/2016  . Rheumatoid arthritis ([place]) 06/23/2016  . Status post right partial knee replacement 09/15/2014  . Status post total knee replacement using cement 06/08/2014  . Screening for colon cancer   . Schatzki's ring   . Hiatal hernia   . Dysphagia, pharyngoesophageal phase 05/26/2014  . Encounter for screening colonoscopy 05/26/2014  . Adrenal adenoma 05/01/2014  . Breast lump 05/01/2014  . Metatarsalgia of both feet 01/12/2014  . Bilateral leg pain 01/11/2014  . Bilateral edema of lower extremity 01/11/2014  . Other osteoarthritis of spine, thoracolumbar region 12/26/2013  . Hypercholesteremia 05/19/2013  . Periodic health assessment, general screening, adult 04/13/2013  . GERD (gastroesophageal reflux disease) 04/13/2013  . Essential hypertension 04/13/2013  . Dyspnea 04/13/2013   Past Medical History:  Diagnosis Date  . Acid reflux    takes Zantac and Omeprazole daily  . Anemia   . Anxiety    takes Citaopram daily  . Arthritis    right knee  . Arthrosis    left thumb  CMC  . Chronic back pain    DDD  . Clotting disorder (HCC)    hx of blood clot following knee scope  . Depression   . History of bronchitis 3+yrs ago  . Hyperlipidemia    takes Atorvastatin daily  . Hypertension    takes Lisinopril and HCTZ daily  . Insomnia    takes Elavil nightly as needed  . Paraesophageal hernia     Family History  Problem Relation Age of Onset  . Cancer Father        lung  . Cancer Sister        breast  . Breast cancer Sister 65  . Cancer Maternal Grandfather         lung  . Cancer Paternal Grandfather        lung  . Diabetes Son   . Colon cancer Neg Hx     Past Surgical History:  Procedure Laterality Date  . BREAST BIOPSY Left   . BUNIONECTOMY Right 02/18/2018   Procedure: Yehuda Budd;  Surgeon: Edrick Kins, DPM;  Location: [place];  Service: Podiatry;  Laterality: Right;  . CAPSULOTOMY Bilateral 02/18/2018   Procedure: CAPSULOTOMY MPJ RELEASE JOINT 2N BILATERAL;  Surgeon: Edrick Kins, DPM;  Location: [place];  Service: Podiatry;  Laterality: Bilateral;  . CARPOMETACARPEL SUSPENSION PLASTY Left 01/27/2018   Procedure: LEFT THUMB ligament reconstruction and tendon interposition;  Surgeon: Leandrew Koyanagi, MD;  Location: [place];  Service: Orthopedics;  Laterality: Left;  . CARPOMETACARPEL SUSPENSION PLASTY Left 03/07/2020   Procedure: REVISION LEFT THUMB CARPOMETACARPAL ([place]) ARTHROPLASTY;  Surgeon: Leandrew Koyanagi, MD;  Location: [place];  Service: Orthopedics;  Laterality: Left;  . CHONDROPLASTY Right 06/28/2014   Procedure: CHONDROPLASTY;  Surgeon: Marianna Payment, MD;  Location: [place];  Service: Orthopedics;  Laterality: Right;  . COLONOSCOPY N/A 05/29/2014   Procedure: COLONOSCOPY;  Surgeon: Daneil Dolin, MD;  [facility];  Service: Endoscopy;  Laterality: N/A;  215pm- Pt is working until 12:00 so she can't come any earlier  . ESOPHAGOGASTRODUODENOSCOPY N/A 05/29/2014   Procedure: ESOPHAGOGASTRODUODENOSCOPY (EGD);  Surgeon: Daneil Dolin, MD;  [facility];  Service: Endoscopy;  Laterality: N/A;  . GANGLION CYST EXCISION Left 01/13/2002  . HAMMER TOE SURGERY Bilateral 02/18/2018   Procedure: HAMMER TOE CORRECTION2ND BILATERAL;  Surgeon: Edrick Kins, DPM;  Location: [place];  Service: Podiatry;  Laterality: Bilateral;  . KNEE ARTHROSCOPY WITH MEDIAL MENISECTOMY Right 06/28/2014   Procedure: RIGHT KNEE ARTHROSCOPY WITH PARTIAL MEDIAL MENISCECTOMY AND CHONDROPLASTY;   Surgeon: Marianna Payment, MD;  Location: [place];  Service: Orthopedics;  Laterality: Right;  . MALONEY DILATION N/A 05/29/2014   Procedure: Venia Minks DILATION;  Surgeon: Daneil Dolin, MD;  [facility];  Service: Endoscopy;  Laterality: N/A;  . PARTIAL KNEE ARTHROPLASTY Right 09/15/2014   Procedure: RIGHT UNICOMPARTMENTAL KNEE ARTHROPLASTY;  Surgeon: Leandrew Koyanagi, MD;  Location: [place];  Service: Orthopedics;  Laterality: Right;  . SHOULDER ARTHROSCOPY Right   . TOTAL KNEE ARTHROPLASTY Left 04/08/2004   Social History   Occupational History  . Occupation: works as Geneticist, molecular  . Smoking status: Never Smoker  . Smokeless tobacco: Never Used  Vaping Use  . Vaping Use: Never used  Substance and Sexual Activity  . Alcohol use: No    Alcohol/week: 0.0 standard drinks  . Drug use: No  . Sexual activity:  Not Currently    Birth control/protection: None, Post-menopausal

## 2020-04-03 ENCOUNTER — Ambulatory Visit: Payer: Self-pay | Admitting: Orthopaedic Surgery

## 2020-04-17 ENCOUNTER — Encounter: Payer: Self-pay | Admitting: Orthopaedic Surgery

## 2020-04-17 ENCOUNTER — Other Ambulatory Visit: Payer: Self-pay

## 2020-04-17 ENCOUNTER — Ambulatory Visit (INDEPENDENT_AMBULATORY_CARE_PROVIDER_SITE_OTHER): Payer: Medicaid Other | Admitting: Orthopaedic Surgery

## 2020-04-17 DIAGNOSIS — M189 Osteoarthritis of first carpometacarpal joint, unspecified: Secondary | ICD-10-CM

## 2020-04-17 NOTE — Progress Notes (Signed)
Post-Op Visit Note   Patient: [name]           Date of Birth: May 05, 1961           MRN: [id number] Visit Date: 04/17/2020 PCP: Lorrene Reid, PA-C   Assessment & Plan:  Chief Complaint:  Chief Complaint  Patient presents with  . Left Hand - Follow-up    Left revision [facility] arthroplasty 03/07/2020   Visit Diagnoses:  1. Arthrosis of first carpometacarpal joint     Plan:   [name] is 6-week status post revision left CMC arthroplasty.  Overall doing well.  Using the thumb spica brace for work.  She does feel fatigue with increased hand use.  Left hand shows fully healed surgical scars.  Good range of motion of the thumb without pain.  Swelling is improved.  Grip and pinch strength are improving and without pain.  At this point she should continue use of brace as needed for work especially.  She can wean as she feels comfortable.  I would like to recheck in 6 weeks.  Follow-Up Instructions: Return in about 6 weeks (around 05/29/2020).   Orders:  No orders of the defined types were placed in this encounter.  No orders of the defined types were placed in this encounter.   Imaging: No results found.  PMFS History: Patient Active Problem List   Diagnosis Date Noted  . Anxiety 11/02/2019  . B12 deficiency 11/02/2019  . Adjustment disorder with mixed anxiety and depressed mood 08/22/2019  . Iron deficiency anemia 08/22/2019  . Gastroesophageal reflux disease 08/22/2019  . Chronic joint pain 08/22/2019  . Stage 3 chronic kidney disease ([place]) 08/22/2019  . Obesity (BMI 35.0-39.9 without comorbidity) 02/28/2019  . Normocytic anemia, not due to blood loss 02/28/2019  . Restless legs syndrome (RLS) 06/30/2018  . Arthrosis of first carpometacarpal joint 02/11/2018  . Primary osteoarthritis of first carpometacarpal joint of left hand   . Pain in left hand 12/23/2017  . Healthcare maintenance 11/16/2017  . Tinea versicolor 11/16/2017  . Chronic right shoulder pain  09/02/2017  . Fibromyalgia 08/11/2017  . Family history of diabetes mellitus 06/23/2016  . Other fatigue 06/23/2016  . Insomnia 06/23/2016  . Rheumatoid arthritis ([place]) 06/23/2016  . Status post right partial knee replacement 09/15/2014  . Status post total knee replacement using cement 06/08/2014  . Screening for colon cancer   . Schatzki's ring   . Hiatal hernia   . Dysphagia, pharyngoesophageal phase 05/26/2014  . Encounter for screening colonoscopy 05/26/2014  . Adrenal adenoma 05/01/2014  . Breast lump 05/01/2014  . Metatarsalgia of both feet 01/12/2014  . Bilateral leg pain 01/11/2014  . Bilateral edema of lower extremity 01/11/2014  . Other osteoarthritis of spine, thoracolumbar region 12/26/2013  . Hypercholesteremia 05/19/2013  . Periodic health assessment, general screening, adult 04/13/2013  . GERD (gastroesophageal reflux disease) 04/13/2013  . Essential hypertension 04/13/2013  . Dyspnea 04/13/2013   Past Medical History:  Diagnosis Date  . Acid reflux    takes Zantac and Omeprazole daily  . Anemia   . Anxiety    takes Citaopram daily  . Arthritis    right knee  . Arthrosis    left thumb CMC  . Chronic back pain    DDD  . Clotting disorder (HCC)    hx of blood clot following knee scope  . Depression   . History of bronchitis 3+yrs ago  . Hyperlipidemia    takes Atorvastatin daily  . Hypertension  takes Lisinopril and HCTZ daily  . Insomnia    takes Elavil nightly as needed  . Paraesophageal hernia     Family History  Problem Relation Age of Onset  . Cancer Father        lung  . Cancer Sister        breast  . Breast cancer Sister 104  . Cancer Maternal Grandfather        lung  . Cancer Paternal Grandfather        lung  . Diabetes Son   . Colon cancer Neg Hx     Past Surgical History:  Procedure Laterality Date  . BREAST BIOPSY Left   . BUNIONECTOMY Right 02/18/2018   Procedure: Yehuda Budd;  Surgeon: Edrick Kins, DPM;   Location: [place];  Service: Podiatry;  Laterality: Right;  . CAPSULOTOMY Bilateral 02/18/2018   Procedure: CAPSULOTOMY MPJ RELEASE JOINT 2N BILATERAL;  Surgeon: Edrick Kins, DPM;  Location: [place];  Service: Podiatry;  Laterality: Bilateral;  . CARPOMETACARPEL SUSPENSION PLASTY Left 01/27/2018   Procedure: LEFT THUMB ligament reconstruction and tendon interposition;  Surgeon: Leandrew Koyanagi, MD;  Location: [place];  Service: Orthopedics;  Laterality: Left;  . CARPOMETACARPEL SUSPENSION PLASTY Left 03/07/2020   Procedure: REVISION LEFT THUMB CARPOMETACARPAL ([place]) ARTHROPLASTY;  Surgeon: Leandrew Koyanagi, MD;  Location: [place];  Service: Orthopedics;  Laterality: Left;  . CHONDROPLASTY Right 06/28/2014   Procedure: CHONDROPLASTY;  Surgeon: Marianna Payment, MD;  Location: [place];  Service: Orthopedics;  Laterality: Right;  . COLONOSCOPY N/A 05/29/2014   Procedure: COLONOSCOPY;  Surgeon: Daneil Dolin, MD;  [facility];  Service: Endoscopy;  Laterality: N/A;  215pm- Pt is working until 12:00 so she can't come any earlier  . ESOPHAGOGASTRODUODENOSCOPY N/A 05/29/2014   Procedure: ESOPHAGOGASTRODUODENOSCOPY (EGD);  Surgeon: Daneil Dolin, MD;  [facility];  Service: Endoscopy;  Laterality: N/A;  . GANGLION CYST EXCISION Left 01/13/2002  . HAMMER TOE SURGERY Bilateral 02/18/2018   Procedure: HAMMER TOE CORRECTION2ND BILATERAL;  Surgeon: Edrick Kins, DPM;  Location: [place];  Service: Podiatry;  Laterality: Bilateral;  . KNEE ARTHROSCOPY WITH MEDIAL MENISECTOMY Right 06/28/2014   Procedure: RIGHT KNEE ARTHROSCOPY WITH PARTIAL MEDIAL MENISCECTOMY AND CHONDROPLASTY;  Surgeon: Marianna Payment, MD;  Location: [place];  Service: Orthopedics;  Laterality: Right;  . MALONEY DILATION N/A 05/29/2014   Procedure: Venia Minks DILATION;  Surgeon: Daneil Dolin, MD;  [facility];  Service: Endoscopy;  Laterality:  N/A;  . PARTIAL KNEE ARTHROPLASTY Right 09/15/2014   Procedure: RIGHT UNICOMPARTMENTAL KNEE ARTHROPLASTY;  Surgeon: Leandrew Koyanagi, MD;  Location: [place];  Service: Orthopedics;  Laterality: Right;  . SHOULDER ARTHROSCOPY Right   . TOTAL KNEE ARTHROPLASTY Left 04/08/2004   Social History   Occupational History  . Occupation: works as Geneticist, molecular  . Smoking status: Never Smoker  . Smokeless tobacco: Never Used  Vaping Use  . Vaping Use: Never used  Substance and Sexual Activity  . Alcohol use: No    Alcohol/week: 0.0 standard drinks  . Drug use: No  . Sexual activity: Not Currently    Birth control/protection: None, Post-menopausal

## 2020-04-18 ENCOUNTER — Telehealth: Payer: Self-pay | Admitting: Physician Assistant

## 2020-04-18 DIAGNOSIS — I10 Essential (primary) hypertension: Secondary | ICD-10-CM

## 2020-04-18 MED ORDER — OMEPRAZOLE 20 MG PO CPDR
20.0000 mg | DELAYED_RELEASE_CAPSULE | Freq: Every day | ORAL | 0 refills | Status: DC
Start: 2020-04-18 — End: 2020-05-17

## 2020-04-18 MED ORDER — HYDROCHLOROTHIAZIDE 50 MG PO TABS: 50.0000 mg | ORAL_TABLET | Freq: Every day | ORAL | 0 refills | Status: DC

## 2020-04-18 MED ORDER — LISINOPRIL 40 MG PO TABS
40.0000 mg | ORAL_TABLET | Freq: Every day | ORAL | 0 refills | Status: DC
Start: 2020-04-18 — End: 2020-05-17

## 2020-04-18 NOTE — Telephone Encounter (Signed)
Patient needs a refill on omeprazole, lisinopril, and hydrochlorothiazide. [facility] on [address]

## 2020-04-18 NOTE — Addendum Note (Signed)
Addended by: Mickel Crow on: 04/18/2020 11:43 AM   Modules accepted: Orders

## 2020-04-19 ENCOUNTER — Other Ambulatory Visit: Payer: Self-pay | Admitting: Physician Assistant

## 2020-04-19 DIAGNOSIS — I10 Essential (primary) hypertension: Secondary | ICD-10-CM

## 2020-04-26 ENCOUNTER — Emergency Department (HOSPITAL_COMMUNITY): Payer: Medicaid Other

## 2020-04-26 ENCOUNTER — Ambulatory Visit (HOSPITAL_COMMUNITY)
Admission: EM | Admit: 2020-04-26 | Discharge: 2020-04-26 | Disposition: A | Discharge status: Home / Self Care | Payer: No Typology Code available for payment source | Attending: Emergency Medicine | Admitting: Emergency Medicine

## 2020-04-26 ENCOUNTER — Emergency Department (HOSPITAL_COMMUNITY)
Admission: EM | Admit: 2020-04-26 | Discharge: 2020-04-26 | Disposition: A | Discharge status: Home / Self Care | Payer: Medicaid Other | Attending: Emergency Medicine | Admitting: Emergency Medicine

## 2020-04-26 DIAGNOSIS — T7421XA Adult sexual abuse, confirmed, initial encounter: Secondary | ICD-10-CM | POA: Insufficient documentation

## 2020-04-26 DIAGNOSIS — S0511XA Contusion of eyeball and orbital tissues, right eye, initial encounter: Secondary | ICD-10-CM | POA: Diagnosis not present

## 2020-04-26 DIAGNOSIS — Z79899 Other long term (current) drug therapy: Secondary | ICD-10-CM | POA: Diagnosis not present

## 2020-04-26 DIAGNOSIS — Z0441 Encounter for examination and observation following alleged adult rape: Secondary | ICD-10-CM | POA: Insufficient documentation

## 2020-04-26 DIAGNOSIS — I10 Essential (primary) hypertension: Secondary | ICD-10-CM | POA: Diagnosis not present

## 2020-04-26 DIAGNOSIS — S0591XA Unspecified injury of right eye and orbit, initial encounter: Secondary | ICD-10-CM | POA: Diagnosis present

## 2020-04-26 LAB — POC URINE PREG, ED: Preg Test, Ur: NEGATIVE

## 2020-04-26 MED ORDER — LIDOCAINE HCL (PF) 1 % IJ SOLN
1.0000 mL | Freq: Once | INTRAMUSCULAR | Status: AC
  Administered 2020-04-26: 06:00:00 1 mL
  Filled 2020-04-26: qty 30

## 2020-04-26 MED ORDER — CEFTRIAXONE SODIUM 1 G IJ SOLR
500.0000 mg | Freq: Once | INTRAMUSCULAR | Status: AC
  Administered 2020-04-26: 06:00:00 500 mg via INTRAMUSCULAR
  Filled 2020-04-26: qty 10

## 2020-04-26 MED ORDER — METRONIDAZOLE 500 MG PO TABS
2000.0000 mg | ORAL_TABLET | Freq: Once | ORAL | Status: AC
  Administered 2020-04-26: 06:00:00 2000 mg via ORAL
  Filled 2020-04-26: qty 4

## 2020-04-26 MED ORDER — AZITHROMYCIN 250 MG PO TABS
1000.0000 mg | ORAL_TABLET | Freq: Once | ORAL | Status: AC
  Administered 2020-04-26: 06:00:00 1000 mg via ORAL
  Filled 2020-04-26: qty 4
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
## 2020-04-27 LAB — POC URINE PREG, ED: Preg Test, Ur: NEGATIVE

## 2020-05-17 ENCOUNTER — Telehealth: Payer: Self-pay | Admitting: Physician Assistant

## 2020-05-17 DIAGNOSIS — I10 Essential (primary) hypertension: Secondary | ICD-10-CM

## 2020-05-17 MED ORDER — LISINOPRIL 40 MG PO TABS
40.0000 mg | ORAL_TABLET | Freq: Every day | ORAL | 0 refills | Status: DC
Start: 2020-05-17 — End: 2020-08-13

## 2020-05-17 MED ORDER — OMEPRAZOLE 20 MG PO CPDR
20.0000 mg | DELAYED_RELEASE_CAPSULE | Freq: Every day | ORAL | 0 refills | Status: DC
Start: 2020-05-17 — End: 2020-08-13

## 2020-05-17 MED ORDER — HYDROCHLOROTHIAZIDE 50 MG PO TABS: 50.0000 mg | ORAL_TABLET | Freq: Every day | ORAL | 0 refills | Status: DC

## 2020-05-17 NOTE — Telephone Encounter (Signed)
Refill sent to requested pharmacy. AS, CMA 

## 2020-05-17 NOTE — Telephone Encounter (Signed)
Patient needs her medications send to [facility] on [address]. Medications are Zestril, Hydrodiuril, and Prilosec. Thanks

## 2020-05-17 NOTE — Addendum Note (Signed)
Addended by: Sylvester Harder on: 05/17/2020 01:31 PM   Modules accepted: Orders

## 2020-05-29 ENCOUNTER — Ambulatory Visit: Payer: Medicaid Other | Admitting: Orthopaedic Surgery

## 2020-06-04 ENCOUNTER — Ambulatory Visit (INDEPENDENT_AMBULATORY_CARE_PROVIDER_SITE_OTHER): Payer: Medicaid Other | Admitting: Physician Assistant

## 2020-06-04 ENCOUNTER — Ambulatory Visit: Payer: Medicaid Other | Admitting: Physician Assistant

## 2020-06-04 ENCOUNTER — Other Ambulatory Visit: Payer: Self-pay

## 2020-06-04 DIAGNOSIS — I10 Essential (primary) hypertension: Secondary | ICD-10-CM | POA: Diagnosis not present

## 2020-06-04 DIAGNOSIS — L989 Disorder of the skin and subcutaneous tissue, unspecified: Secondary | ICD-10-CM

## 2020-06-04 DIAGNOSIS — F419 Anxiety disorder, unspecified: Secondary | ICD-10-CM

## 2020-06-04 DIAGNOSIS — M069 Rheumatoid arthritis, unspecified: Secondary | ICD-10-CM | POA: Diagnosis not present

## 2020-06-04 DIAGNOSIS — K219 Gastro-esophageal reflux disease without esophagitis: Secondary | ICD-10-CM | POA: Diagnosis not present

## 2020-06-04 DIAGNOSIS — D649 Anemia, unspecified: Secondary | ICD-10-CM

## 2020-06-04 DIAGNOSIS — K449 Diaphragmatic hernia without obstruction or gangrene: Secondary | ICD-10-CM

## 2020-06-04 DIAGNOSIS — D509 Iron deficiency anemia, unspecified: Secondary | ICD-10-CM

## 2020-06-04 MED ORDER — FLUOXETINE HCL 20 MG PO TABS: ORAL_TABLET | ORAL | 0 refills | Status: DC
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
## 2020-06-04 NOTE — Patient Instructions (Signed)
Rheumatoid Arthritis Rheumatoid arthritis (RA) is a long-term (chronic) disease. RA causes inflammation in your joints. Your joints may feel painful, stiff, swollen, and warm. RA may start slowly. It most often affects the small joints of the hands and feet. It can also affect other parts of the body. Symptoms of RA often come and go. There is no cure for RA, but medicines can help your symptoms. What are the causes?  RA is an autoimmune disease. This means that your body's defense system (immune system) attacks healthy parts of your body by mistake. The exact cause of RA is not known. What increases the risk?  Being a woman.  Having a family history of RA or other diseases like RA.  Smoking.  Being overweight.  Being exposed to pollutants or chemicals. What are the signs or symptoms?  Morning stiffness that lasts longer than 30 minutes. This is often the first symptom.  Symptoms start slowly. They are often worse in the morning.  As RA gets worse, symptoms may include: ? Pain, stiffness, swelling, warmth, and tenderness in joints on both sides of your body. ? Loss of energy. ? Not feeling hungry. ? Weight loss. ? A low fever. ? Dry eyes and a dry mouth. ? Firm lumps that grow under your skin. ? Changes in the way your joints look. ? Changes in the way your joints work.  Symptoms vary and they: ? Often come and go. ? Sometimes get worse for a period of time. These are called flares. How is this treated?  Treatment may include: ? Taking good care of yourself. Be sure to rest as needed, eat a healthy diet, and exercise. ? Medicines. These may include:  Pain relievers.  Medicines to help with inflammation.  Disease-modifying antirheumatic drugs (DMARDs).  Medicines called biologic response modifiers. ? Physical therapy and occupational therapy. ? Surgery, if joint damage is very bad. Your doctor will work with you to find the best treatments.   Follow these  instructions at home: Activity  Return to your normal activities as told by your doctor. Ask your doctor what activities are safe for you.  Rest when you have a flare.  Exercise as told by your doctor. General instructions  Take over-the-counter and prescription medicines only as told by your doctor.  Keep all follow-up visits as told by your doctor. This is important. Where to find more information  [employer] of Rheumatology: www.rheumatology.[place]: www.arthritis.org Contact a doctor if:  You have a flare.  You have a fever.  You have problems because of your medicines. Get help right away if:  You have chest pain.  You have trouble breathing.  You get a hot, painful joint all of a sudden, and it is worse than your normal joint aches. Summary  RA is a long-term disease.  Symptoms of RA start slowly. They are often worse in the morning.  RA causes inflammation in your joints. This information is not intended to replace advice given to you by your health care provider. Make sure you discuss any questions you have with your health care provider. Document Revised: 12/30/2017 Document Reviewed: 12/30/2017 Elsevier Patient Education  2021 [employer].

## 2020-06-05 ENCOUNTER — Ambulatory Visit: Payer: Medicaid Other | Admitting: Orthopaedic Surgery

## 2020-06-05 LAB — CBC WITH DIFFERENTIAL/PLATELET
Basophils Absolute: 0 10*3/uL (ref 0.0–0.2)
Basos: 1 %
EOS (ABSOLUTE): 0 10*3/uL (ref 0.0–0.4)
Eos: 1 %
Hematocrit: 36.1 % (ref 34.0–46.6)
Hemoglobin: 11.9 g/dL (ref 11.1–15.9)
Immature Grans (Abs): 0 10*3/uL (ref 0.0–0.1)
Immature Granulocytes: 0 %
Lymphocytes Absolute: 1.8 10*3/uL (ref 0.7–3.1)
Lymphs: 37 %
MCH: 27.5 pg (ref 26.6–33.0)
MCHC: 33 g/dL (ref 31.5–35.7)
MCV: 83 fL (ref 79–97)
Monocytes Absolute: 0.6 10*3/uL (ref 0.1–0.9)
Monocytes: 13 %
Neutrophils Absolute: 2.3 10*3/uL (ref 1.4–7.0)
Neutrophils: 48 %
Platelets: 357 10*3/uL (ref 150–450)
RBC: 4.33 x10E6/uL (ref 3.77–5.28)
RDW: 16.6 % — ABNORMAL HIGH (ref 11.7–15.4)
WBC: 4.7 10*3/uL (ref 3.4–10.8)

## 2020-06-05 LAB — COMPREHENSIVE METABOLIC PANEL
ALT: 19 IU/L (ref 0–32)
AST: 26 IU/L (ref 0–40)
Albumin/Globulin Ratio: 1.6 (ref 1.2–2.2)
Albumin: 4.1 g/dL (ref 3.8–4.9)
Alkaline Phosphatase: 103 IU/L (ref 44–121)
BUN/Creatinine Ratio: 13 (ref 9–23)
BUN: 15 mg/dL (ref 6–24)
Bilirubin Total: <0.2 mg/dL (ref 0.0–1.2)
CO2: 26 mmol/L (ref 20–29)
Calcium: 9.3 mg/dL (ref 8.7–10.2)
Chloride: 98 mmol/L (ref 96–106)
Creatinine, Ser: 1.17 mg/dL — ABNORMAL HIGH (ref 0.57–1.00)
GFR calc Af Amer: 59 mL/min/{1.73_m2} — ABNORMAL LOW (ref >59)
GFR calc non Af Amer: 51 mL/min/{1.73_m2} — ABNORMAL LOW (ref >59)
Globulin, Total: 2.5 g/dL (ref 1.5–4.5)
Glucose: 103 mg/dL — ABNORMAL HIGH (ref 65–99)
Potassium: 3.5 mmol/L (ref 3.5–5.2)
Sodium: 139 mmol/L (ref 134–144)
Total Protein: 6.6 g/dL (ref 6.0–8.5)

## 2020-06-05 LAB — IRON,TIBC AND FERRITIN PANEL
Ferritin: 20 ng/mL (ref 15–150)
Iron Saturation: 8 % — CL (ref 15–55)
Iron: 30 ug/dL (ref 27–159)
Total Iron Binding Capacity: 387 ug/dL (ref 250–450)
UIBC: 357 ug/dL (ref 131–425)

## 2020-07-23 ENCOUNTER — Ambulatory Visit: Payer: Medicaid Other | Admitting: Internal Medicine

## 2020-08-13 ENCOUNTER — Other Ambulatory Visit: Payer: Self-pay | Admitting: Physician Assistant

## 2020-08-13 ENCOUNTER — Telehealth: Payer: Self-pay | Admitting: Physician Assistant

## 2020-08-13 ENCOUNTER — Other Ambulatory Visit: Payer: Self-pay | Admitting: Podiatry

## 2020-08-13 DIAGNOSIS — R0602 Shortness of breath: Secondary | ICD-10-CM

## 2020-08-13 DIAGNOSIS — Z1231 Encounter for screening mammogram for malignant neoplasm of breast: Secondary | ICD-10-CM

## 2020-08-13 DIAGNOSIS — I10 Essential (primary) hypertension: Secondary | ICD-10-CM

## 2020-08-13 NOTE — Telephone Encounter (Signed)
Pt states at her last apt she discussed her SOB with you and that you advised that pt should go have chest xray to determine if she has COPD. There is not an order placed and I do not see this documented in the note.   Please advise. AS, CMA

## 2020-08-13 NOTE — Telephone Encounter (Signed)
Patient states that [place] does not accept medicaid and would like a referral to a dermatologist who does. She asked for [facility] which is under Dr. Allyson Sabal. Please advise, thanks.

## 2020-08-13 NOTE — Telephone Encounter (Signed)
Patient needs an x-ray to see what is going on with her lungs to see if she has copd according to [facility] the patient states.

## 2020-08-13 NOTE — Telephone Encounter (Signed)
Per [name] ok to order chest xray for SOB.   Left msg for patient to call back. AS, CMA

## 2020-08-13 NOTE — Telephone Encounter (Signed)
Patient is aware of the below and verbalized understanding. AS, CMA 

## 2020-08-13 NOTE — Addendum Note (Signed)
Addended by: Mickel Crow on: 08/13/2020 02:42 PM   Modules accepted: Orders

## 2020-08-22 ENCOUNTER — Ambulatory Visit: Payer: Medicaid Other | Admitting: Orthopaedic Surgery

## 2020-08-29 ENCOUNTER — Ambulatory Visit: Payer: Medicaid Other | Admitting: Gastroenterology

## 2020-08-29 ENCOUNTER — Ambulatory Visit: Payer: Medicare Other | Admitting: Orthopaedic Surgery

## 2020-09-03 ENCOUNTER — Ambulatory Visit: Payer: Medicaid Other | Admitting: Physician Assistant

## 2020-09-05 ENCOUNTER — Ambulatory Visit: Payer: Medicare Other | Admitting: Orthopaedic Surgery

## 2020-09-06 ENCOUNTER — Encounter: Payer: Self-pay | Admitting: Gastroenterology

## 2020-09-06 ENCOUNTER — Ambulatory Visit: Payer: Medicare Other | Admitting: Orthopaedic Surgery

## 2020-09-06 ENCOUNTER — Ambulatory Visit (INDEPENDENT_AMBULATORY_CARE_PROVIDER_SITE_OTHER): Payer: Medicare Other | Admitting: Gastroenterology

## 2020-09-06 VITALS — Ht 63.5 in | Wt 224.5 lb

## 2020-09-06 DIAGNOSIS — K449 Diaphragmatic hernia without obstruction or gangrene: Secondary | ICD-10-CM | POA: Diagnosis not present

## 2020-09-06 DIAGNOSIS — R1310 Dysphagia, unspecified: Secondary | ICD-10-CM

## 2020-09-06 DIAGNOSIS — K219 Gastro-esophageal reflux disease without esophagitis: Secondary | ICD-10-CM

## 2020-09-06 DIAGNOSIS — Z1211 Encounter for screening for malignant neoplasm of colon: Secondary | ICD-10-CM

## 2020-09-06 MED ORDER — NA SULFATE-K SULFATE-MG SULF 17.5-3.13-1.6 GM/177ML PO SOLN: 1.0000 | Freq: Once | ORAL | 0 refills | Status: AC

## 2020-09-06 NOTE — Patient Instructions (Addendum)
If you are age 59 or older, your body mass index should be between 23-30. Your Body mass index is 39.14 kg/m. If this is out of the aforementioned range listed, please consider follow up with your Primary Care Provider.  If you are age 33 or younger, your body mass index should be between 19-25. Your Body mass index is 39.14 kg/m. If this is out of the aformentioned range listed, please consider follow up with your Primary Care Provider.   You have been scheduled for an endoscopy and colonoscopy. Please follow the written instructions given to you at your visit today. Please pick up your prep supplies at the pharmacy within the next 1-3 days. If you use inhalers (even only as needed), please bring them with you on the day of your procedure.  Due to recent changes in healthcare laws, you may see the results of your imaging and laboratory studies on MyChart before your provider has had a chance to review them.  We understand that in some cases there may be results that are confusing or concerning to you. Not all laboratory results come back in the same time frame and the provider may be waiting for multiple results in order to interpret others.  Please give [place] 48 hours in order for your provider to thoroughly review all the results before contacting the office for clarification of your results.   Thank you for choosing me and [facility].  Alonza Bogus, PA-C
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
## 2020-09-07 NOTE — Progress Notes (Signed)
Reviewed and agree with documentation and assessment and plan. K. Veena Letrice Pollok , MD   

## 2020-09-12 ENCOUNTER — Ambulatory Visit
Admission: RE | Admit: 2020-09-12 | Discharge: 2020-09-12 | Disposition: A | Discharge status: Home / Self Care | Payer: Medicare Other | Source: Ambulatory Visit | Attending: Physician Assistant | Admitting: Physician Assistant

## 2020-09-12 ENCOUNTER — Other Ambulatory Visit: Payer: Self-pay

## 2020-09-12 ENCOUNTER — Ambulatory Visit (INDEPENDENT_AMBULATORY_CARE_PROVIDER_SITE_OTHER): Payer: Medicare Other

## 2020-09-12 ENCOUNTER — Encounter: Payer: Self-pay | Admitting: Orthopaedic Surgery

## 2020-09-12 ENCOUNTER — Ambulatory Visit (INDEPENDENT_AMBULATORY_CARE_PROVIDER_SITE_OTHER): Payer: Medicare Other | Admitting: Orthopaedic Surgery

## 2020-09-12 DIAGNOSIS — M189 Osteoarthritis of first carpometacarpal joint, unspecified: Secondary | ICD-10-CM | POA: Diagnosis not present

## 2020-09-12 DIAGNOSIS — R0602 Shortness of breath: Secondary | ICD-10-CM

## 2020-09-12 DIAGNOSIS — M25542 Pain in joints of left hand: Secondary | ICD-10-CM
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
## 2020-09-13 ENCOUNTER — Encounter: Payer: Self-pay | Admitting: Physician Assistant

## 2020-09-13 ENCOUNTER — Ambulatory Visit (INDEPENDENT_AMBULATORY_CARE_PROVIDER_SITE_OTHER): Payer: Medicare Other | Admitting: Physician Assistant

## 2020-09-13 VITALS — BP 132/89 | Ht 63.5 in | Wt 224.0 lb

## 2020-09-13 DIAGNOSIS — R0602 Shortness of breath: Secondary | ICD-10-CM

## 2020-09-13 DIAGNOSIS — F4323 Adjustment disorder with mixed anxiety and depressed mood: Secondary | ICD-10-CM

## 2020-09-13 DIAGNOSIS — I10 Essential (primary) hypertension: Secondary | ICD-10-CM

## 2020-09-13 DIAGNOSIS — D509 Iron deficiency anemia, unspecified: Secondary | ICD-10-CM

## 2020-09-13 DIAGNOSIS — F419 Anxiety disorder, unspecified: Secondary | ICD-10-CM

## 2020-09-13 DIAGNOSIS — M069 Rheumatoid arthritis, unspecified: Secondary | ICD-10-CM

## 2020-09-13 MED ORDER — METOPROLOL SUCCINATE ER 25 MG PO TB24: 25.0000 mg | ORAL_TABLET | Freq: Every day | ORAL | 0 refills | Status: DC

## 2020-09-13 MED ORDER — ALBUTEROL SULFATE HFA 108 (90 BASE) MCG/ACT IN AERS: 2.0000 | INHALATION_SPRAY | Freq: Four times a day (QID) | RESPIRATORY_TRACT | 0 refills | Status: DC | PRN
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
## 2020-09-13 NOTE — Patient Instructions (Signed)
Shortness of Breath, Adult Shortness of breath means you have trouble breathing. Shortness of breath could be a sign of a medical problem. Follow these instructions at home:  Watch for any changes in your symptoms.  Do not use any products that contain nicotine or tobacco, such as cigarettes, e-cigarettes, and chewing tobacco.  Do not smoke. Smoking can cause shortness of breath. If you need help to quit smoking, ask your doctor.  Avoid things that can make it harder to breathe, such as: ? Mold. ? Dust. ? Air pollution. ? Chemical smells. ? Things that can cause allergy symptoms (allergens), if you have allergies.  Keep your living space clean. Use products that help remove mold and dust.  Rest as needed. Slowly return to your normal activities.  Take over-the-counter and prescription medicines only as told by your doctor. This includes oxygen therapy and inhaled medicines.  Keep all follow-up visits as told by your doctor. This is important.   Contact a doctor if:  Your condition does not get better as soon as expected.  You have a hard time doing your normal activities, even after you rest.  You have new symptoms. Get help right away if:  Your shortness of breath gets worse.  You have trouble breathing when you are resting.  You feel light-headed or you pass out (faint).  You have a cough that is not helped by medicines.  You cough up blood.  You have pain with breathing.  You have pain in your chest, arms, shoulders, or belly (abdomen).  You have a fever.  You cannot walk up stairs.  You cannot exercise the way you normally do. These symptoms may represent a serious problem that is an emergency. Do not wait to see if the symptoms will go away. Get medical help right away. Call your local emergency services (911 in the [place] Do not drive yourself to the hospital. Summary  Shortness of breath is when you have trouble breathing enough air. It can be a sign of a  medical problem.  Avoid things that make it hard for you to breathe, such as smoking, pollution, mold, and dust.  Watch for any changes in your symptoms. Contact your doctor if you do not get better or you get worse. This information is not intended to replace advice given to you by your health care provider. Make sure you discuss any questions you have with your health care provider. Document Revised: 09/28/2017 Document Reviewed: 09/28/2017 Elsevier Patient Education  2021 Elsevier Inc.  

## 2020-09-18 ENCOUNTER — Telehealth: Payer: Self-pay | Admitting: Physician Assistant

## 2020-09-18 MED ORDER — FLUOXETINE HCL 20 MG PO TABS: ORAL_TABLET | ORAL | 0 refills | Status: DC

## 2020-09-18 NOTE — Telephone Encounter (Signed)
Patient called to inquire if Rx were sent to  Pharmacy- confirmed sent to [facility] on [address] on 09/13/20.

## 2020-09-20 ENCOUNTER — Other Ambulatory Visit: Payer: Medicare Other

## 2020-09-20 ENCOUNTER — Ambulatory Visit: Payer: Medicare Other | Admitting: Internal Medicine
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
## 2020-10-03 ENCOUNTER — Ambulatory Visit: Payer: Medicaid Other

## 2020-10-16 ENCOUNTER — Ambulatory Visit (AMBULATORY_SURGERY_CENTER): Payer: Medicare Other | Admitting: Gastroenterology

## 2020-10-16 ENCOUNTER — Other Ambulatory Visit: Payer: Self-pay

## 2020-10-16 ENCOUNTER — Encounter: Payer: Self-pay | Admitting: Gastroenterology

## 2020-10-16 VITALS — BP 104/60 | HR 63 | Temp 97.7°F | Resp 15 | Ht 63.5 in | Wt 224.0 lb

## 2020-10-16 DIAGNOSIS — R1310 Dysphagia, unspecified: Secondary | ICD-10-CM | POA: Diagnosis not present

## 2020-10-16 DIAGNOSIS — K219 Gastro-esophageal reflux disease without esophagitis: Secondary | ICD-10-CM

## 2020-10-16 DIAGNOSIS — D128 Benign neoplasm of rectum: Secondary | ICD-10-CM

## 2020-10-16 DIAGNOSIS — D127 Benign neoplasm of rectosigmoid junction: Secondary | ICD-10-CM | POA: Diagnosis not present

## 2020-10-16 DIAGNOSIS — D125 Benign neoplasm of sigmoid colon: Secondary | ICD-10-CM

## 2020-10-16 DIAGNOSIS — K449 Diaphragmatic hernia without obstruction or gangrene: Secondary | ICD-10-CM

## 2020-10-16 DIAGNOSIS — Z1211 Encounter for screening for malignant neoplasm of colon: Secondary | ICD-10-CM | POA: Diagnosis not present

## 2020-10-16 DIAGNOSIS — D122 Benign neoplasm of ascending colon: Secondary | ICD-10-CM | POA: Diagnosis not present

## 2020-10-16 MED ORDER — SODIUM CHLORIDE 0.9 % IV SOLN: 500.0000 mL | Freq: Once | INTRAVENOUS | Status: DC

## 2020-10-16 NOTE — Progress Notes (Signed)
Vital signs checked by:CW ? ?The medical and surgical history was reviewed and verified with the patient. ? ?

## 2020-10-16 NOTE — Patient Instructions (Signed)
Handouts on hiatal hernia and polyps given to you today  Await pathology results from Dr. Silverio Decamp    YOU HAD AN ENDOSCOPIC PROCEDURE TODAY AT THE [facility]:   Refer to the procedure report that was given to you for any specific questions about what was found during the examination.  If the procedure report does not answer your questions, please call your gastroenterologist to clarify.  If you requested that your care partner not be given the details of your procedure findings, then the procedure report has been included in a sealed envelope for you to review at your convenience later.  YOU SHOULD EXPECT: Some feelings of bloating in the abdomen. Passage of more gas than usual.  Walking can help get rid of the air that was put into your GI tract during the procedure and reduce the bloating. If you had a lower endoscopy (such as a colonoscopy or flexible sigmoidoscopy) you may notice spotting of blood in your stool or on the toilet paper. If you underwent a bowel prep for your procedure, you may not have a normal bowel movement for a few days.  Please Note:  You might notice some irritation and congestion in your nose or some drainage.  This is from the oxygen used during your procedure.  There is no need for concern and it should clear up in a day or so.  SYMPTOMS TO REPORT IMMEDIATELY:   Following lower endoscopy (colonoscopy or flexible sigmoidoscopy):  Excessive amounts of blood in the stool  Significant tenderness or worsening of abdominal pains  Swelling of the abdomen that is new, acute  Fever of 100F or higher   Following upper endoscopy (EGD)  Vomiting of blood or coffee ground material  New chest pain or pain under the shoulder blades  Painful or persistently difficult swallowing  New shortness of breath  Fever of 100F or higher  Black, tarry-looking stools  For urgent or emergent issues, a gastroenterologist can be reached at any hour by calling [phone]  [phone]. Do not use MyChart messaging for urgent concerns.    DIET:  We do recommend a small meal at first, but then you may proceed to your regular diet.  Drink plenty of fluids but you should avoid alcoholic beverages for 24 hours.  ACTIVITY:  You should plan to take it easy for the rest of today and you should NOT DRIVE or use heavy machinery until tomorrow (because of the sedation medicines used during the test).    FOLLOW UP: Our staff will call the number listed on your records 48-72 hours following your procedure to check on you and address any questions or concerns that you may have regarding the information given to you following your procedure. If we do not reach you, we will leave a message.  We will attempt to reach you two times.  During this call, we will ask if you have developed any symptoms of COVID 19. If you develop any symptoms (ie: fever, flu-like symptoms, shortness of breath, cough etc.) before then, please call [phone].  If you test positive for Covid 19 in the 2 weeks post procedure, please call and report this information to [place].    If any biopsies were taken you will be contacted by phone or by letter within the next 1-3 weeks.  Please call us at [phone] if you have not heard about the biopsies in 3 weeks.    SIGNATURES/CONFIDENTIALITY: You and/or your care partner have signed paperwork which will  be entered into your electronic medical record.  These signatures attest to the fact that that the information above on your After Visit Summary has been reviewed and is understood.  Full responsibility of the confidentiality of this discharge information lies with you and/or your care-partner. 

## 2020-10-16 NOTE — Progress Notes (Signed)
pt tolerated well. VSS. awake and to recovery. Report given to RN. Bite block left insitu to recovery. 

## 2020-10-16 NOTE — Progress Notes (Signed)
Called to room to assist during endoscopic procedure.  Patient ID and intended procedure confirmed with present staff. Received instructions for my participation in the procedure from the performing physician.  

## 2020-10-16 NOTE — Op Note (Signed)
[facility] Patient Name: [name] Procedure Date: 10/16/2020 2:35 PM MRN: [id number] Endoscopist: Mauri Pole , MD Age: 60 Referring MD:  Date of Birth: August 21, 1960 Gender: Female Account #: [id number] Procedure:                Upper GI endoscopy Indications:              Dysphagia Medicines:                Monitored Anesthesia Care Procedure:                Pre-Anesthesia Assessment:                           - Prior to the procedure, a History and Physical                            was performed, and patient medications and                            allergies were reviewed. The patient's tolerance of                            previous anesthesia was also reviewed. The risks                            and benefits of the procedure and the sedation                            options and risks were discussed with the patient.                            All questions were answered, and informed consent                            was obtained. Prior Anticoagulants: The patient has                            taken no previous anticoagulant or antiplatelet                            agents. ASA Grade Assessment: II - A patient with                            mild systemic disease. After reviewing the risks                            and benefits, the patient was deemed in                            satisfactory condition to undergo the procedure.                           After obtaining informed consent, the endoscope was  passed under direct vision. Throughout the                            procedure, the patient's blood pressure, pulse, and                            oxygen saturations were monitored continuously. The                            Endoscope was introduced through the mouth, and                            advanced to the second part of duodenum. The upper                            GI endoscopy was accomplished without  difficulty.                            The patient tolerated the procedure well. Scope In: Scope Out: Findings:                 No endoscopic abnormality was evident in the                            esophagus to explain the patient's complaint of                            dysphagia.                           The Z-line was regular and was found 36 cm from the                            incisors.                           A large hiatal hernia was present.                           The stomach was otherwise normal.                           The cardia and gastric fundus were normal on                            retroflexion.                           The examined duodenum was normal. Complications:            No immediate complications. Estimated Blood Loss:     Estimated blood loss was minimal. Impression:               - No endoscopic esophageal abnormality to explain                            patient's dysphagia.                           -  Z-line regular, 36 cm from the incisors.                           - Large hiatal hernia.                           - Normal stomach.                           - Normal examined duodenum.                           - No specimens collected. Recommendation:           - Patient has a contact number available for                            emergencies. The signs and symptoms of potential                            delayed complications were discussed with the                            patient. Return to normal activities tomorrow.                            Written discharge instructions were provided to the                            patient.                           - Resume previous diet.                           - Continue present medications.                           - Follow an antireflux regimen.                           - Esophageal manometry to be scheduled at [facility]                            endoscopy                           -  Refer to Dr Kipp Brood for evaluation for hernia                            repair Javier Docker Mauri Pole, MD 10/16/2020 3:20:18 PM This report has been signed electronically.

## 2020-10-16 NOTE — Op Note (Signed)
[facility] Patient Name: [name] Procedure Date: 10/16/2020 2:34 PM MRN: [id number] Endoscopist: Mauri Pole , MD Age: 60 Referring MD:  Date of Birth: 1960/12/18 Gender: Female Account #: [id number] Procedure:                Colonoscopy Indications:              Screening for colorectal malignant neoplasm Medicines:                Monitored Anesthesia Care Procedure:                Pre-Anesthesia Assessment:                           - Prior to the procedure, a History and Physical                            was performed, and patient medications and                            allergies were reviewed. The patient's tolerance of                            previous anesthesia was also reviewed. The risks                            and benefits of the procedure and the sedation                            options and risks were discussed with the patient.                            All questions were answered, and informed consent                            was obtained. Prior Anticoagulants: The patient has                            taken no previous anticoagulant or antiplatelet                            agents. ASA Grade Assessment: II - A patient with                            mild systemic disease. After reviewing the risks                            and benefits, the patient was deemed in                            satisfactory condition to undergo the procedure.                           After obtaining informed consent, the colonoscope  was passed under direct vision. Throughout the                            procedure, the patient's blood pressure, pulse, and                            oxygen saturations were monitored continuously. The                            Olympus PCF-H190DL (#[id number]) Colonoscope was                            introduced through the anus and advanced to the the                            cecum, identified  by appendiceal orifice and                            ileocecal valve. The colonoscopy was performed                            without difficulty. The patient tolerated the                            procedure well. The quality of the bowel                            preparation was excellent. The ileocecal valve,                            appendiceal orifice, and rectum were photographed. Scope In: 2:50:20 PM Scope Out: 3:08:32 PM Scope Withdrawal Time: 0 hours 14 minutes 25 seconds  Total Procedure Duration: 0 hours 18 minutes 12 seconds  Findings:                 The perianal and digital rectal examinations were                            normal.                           Four sessile polyps were found in the rectum,                            sigmoid colon and ascending colon. The polyps were                            3 to 7 mm in size. These polyps were removed with a                            cold snare. Resection and retrieval were complete.                           Non-bleeding internal hemorrhoids were found during  retroflexion. The hemorrhoids were medium-sized. Complications:            No immediate complications. Estimated Blood Loss:     Estimated blood loss was minimal. Impression:               - Four 3 to 7 mm polyps in the rectum, in the                            sigmoid colon and in the ascending colon, removed                            with a cold snare. Resected and retrieved.                           - Non-bleeding internal hemorrhoids. Recommendation:           - Patient has a contact number available for                            emergencies. The signs and symptoms of potential                            delayed complications were discussed with the                            patient. Return to normal activities tomorrow.                            Written discharge instructions were provided to the                             patient.                           - Resume previous diet.                           - Continue present medications.                           - Await pathology results.                           - Repeat colonoscopy in 3 - 5 years for                            surveillance based on pathology results. Mauri Pole, MD 10/16/2020 3:22:33 PM This report has been signed electronically.

## 2020-10-17 ENCOUNTER — Other Ambulatory Visit: Payer: Self-pay

## 2020-10-17 DIAGNOSIS — K449 Diaphragmatic hernia without obstruction or gangrene: Secondary | ICD-10-CM

## 2020-10-17 DIAGNOSIS — K219 Gastro-esophageal reflux disease without esophagitis: Secondary | ICD-10-CM

## 2020-10-18 ENCOUNTER — Telehealth: Payer: Self-pay | Admitting: *Deleted

## 2020-10-18 ENCOUNTER — Telehealth: Payer: Self-pay

## 2020-10-18 ENCOUNTER — Other Ambulatory Visit: Payer: Self-pay

## 2020-10-18 DIAGNOSIS — R1310 Dysphagia, unspecified: Secondary | ICD-10-CM

## 2020-10-18 DIAGNOSIS — K219 Gastro-esophageal reflux disease without esophagitis: Secondary | ICD-10-CM

## 2020-10-18 NOTE — Telephone Encounter (Signed)
  Follow up Call-  Call back number 10/16/2020  Post procedure Call Back phone  # [phone]  Permission to leave phone message Yes  Some recent data might be hidden     Patient questions:  Do you have a fever, pain , or abdominal swelling? No. Pain Score  0 *  Have you tolerated food without any problems? Yes.    Have you been able to return to your normal activities? Yes.    Do you have any questions about your discharge instructions: Diet   No. Medications  No. Follow up visit  No.  Do you have questions or concerns about your Care? No.  Actions: * If pain score is 4 or above: No action needed, pain <4.  Have you developed a fever since your procedure? no  2.   Have you had an respiratory symptoms (SOB or cough) since your procedure? no  3.   Have you tested positive for COVID 19 since your procedure no  4.   Have you had any family members/close contacts diagnosed with the COVID 19 since your procedure?  no   If yes to any of these questions please route to Joylene John, RN and Joella Prince, RN

## 2020-10-18 NOTE — Telephone Encounter (Signed)
Attempted f/u call. No answer left VM.  

## 2020-10-24 ENCOUNTER — Other Ambulatory Visit: Payer: Self-pay

## 2020-10-24 ENCOUNTER — Telehealth: Payer: Self-pay | Admitting: Physician Assistant

## 2020-10-24 DIAGNOSIS — F419 Anxiety disorder, unspecified: Secondary | ICD-10-CM

## 2020-10-24 MED ORDER — FLUOXETINE HCL 20 MG PO TABS: 30.0000 mg | ORAL_TABLET | Freq: Every day | ORAL | 0 refills | Status: DC

## 2020-10-24 NOTE — Telephone Encounter (Signed)
Called patient to discuss medication dosage increase. She wants to stay at 30mg  for a bit longer to see if it helps with the remaining depression. A refill for a 90 day supply has been sent to the [facility] on [address].

## 2020-10-24 NOTE — Telephone Encounter (Signed)
Patient is returning your call.  

## 2020-10-24 NOTE — Telephone Encounter (Signed)
Called patient to discuss medication.  Left voicemail to callback.

## 2020-10-24 NOTE — Telephone Encounter (Signed)
Patient states her depression medication is helping and it is doing better and patient wants to know if she wants it to go up to two tablets. Patient's inhaler is helping. [facility] on [address], thanks.   (I believe the anxiety medicine is Prozac? Patient did not know name)

## 2020-10-25 ENCOUNTER — Ambulatory Visit (INDEPENDENT_AMBULATORY_CARE_PROVIDER_SITE_OTHER): Payer: Medicare Other | Admitting: Physician Assistant

## 2020-10-25 ENCOUNTER — Encounter: Payer: Self-pay | Admitting: Physician Assistant

## 2020-10-25 VITALS — BP 123/79 | HR 70 | Ht 63.5 in | Wt 224.0 lb

## 2020-10-25 DIAGNOSIS — F4323 Adjustment disorder with mixed anxiety and depressed mood: Secondary | ICD-10-CM | POA: Diagnosis not present

## 2020-10-25 DIAGNOSIS — F419 Anxiety disorder, unspecified: Secondary | ICD-10-CM

## 2020-10-25 DIAGNOSIS — R911 Solitary pulmonary nodule: Secondary | ICD-10-CM

## 2020-10-25 DIAGNOSIS — I10 Essential (primary) hypertension: Secondary | ICD-10-CM

## 2020-10-25 DIAGNOSIS — R0602 Shortness of breath: Secondary | ICD-10-CM

## 2020-10-25 NOTE — Patient Instructions (Signed)
Shortness of Breath, Adult Shortness of breath means you have trouble breathing. Shortness of breath couldbe a sign of a medical problem. Follow these instructions at home:  Watch for any changes in your symptoms. Do not use any products that contain nicotine or tobacco, such as cigarettes, e-cigarettes, and chewing tobacco. Do not smoke. Smoking can cause shortness of breath. If you need help to quit smoking, ask your doctor. Avoid things that can make it harder to breathe, such as: Mold. Dust. Air pollution. Chemical smells. Things that can cause allergy symptoms (allergens), if you have allergies. Keep your living space clean. Use products that help remove mold and dust. Rest as needed. Slowly return to your normal activities. Take over-the-counter and prescription medicines only as told by your doctor. This includes oxygen therapy and inhaled medicines. Keep all follow-up visits as told by your doctor. This is important. Contact a doctor if: Your condition does not get better as soon as expected. You have a hard time doing your normal activities, even after you rest. You have new symptoms. Get help right away if: Your shortness of breath gets worse. You have trouble breathing when you are resting. You feel light-headed or you pass out (faint). You have a cough that is not helped by medicines. You cough up blood. You have pain with breathing. You have pain in your chest, arms, shoulders, or belly (abdomen). You have a fever. You cannot walk up stairs. You cannot exercise the way you normally do. These symptoms may represent a serious problem that is an emergency. Do not wait to see if the symptoms will go away. Get medical help right away. Call your local emergency services (911 in the [place] Do not drive yourself to the hospital. Summary Shortness of breath is when you have trouble breathing enough air. It can be a sign of a medical problem. Avoid things that make it hard for  you to breathe, such as smoking, pollution, mold, and dust. Watch for any changes in your symptoms. Contact your doctor if you do not get better or you get worse. This information is not intended to replace advice given to you by your health care provider. Make sure you discuss any questions you have with your healthcare provider. Document Revised: 09/28/2017 Document Reviewed: 09/28/2017 Elsevier Patient Education  2022 [employer].

## 2020-10-25 NOTE — Progress Notes (Signed)
Telehealth office visit note for Brandi Reid, PA-C- at Primary Care at [facility]   I connected with current patient today by telephone and verified that I am speaking with the correct person    Location of the patient: Home  Location of the provider: Office - This visit type was conducted due to national recommendations for restrictions regarding the COVID-19 Pandemic (e.g. social distancing) in an effort to limit this patient's exposure and mitigate transmission in our community.    - No physical exam could be performed with this format, beyond that communicated to [place] by the patient/ family members as noted.   - Additionally my office staff/ schedulers were to discuss with the patient that there may be a monetary charge related to this service, depending on their medical insurance.  My understanding is that patient understood and consented to proceed.     _________________________________________________________________________________   History of Present Illness: Patient calls in to follow-up on hypertension and mood.  HTN: Pt denies chest pain, palpitations, dizziness, edema or orthopnea taking medication as directed without side effects. Checks BP at home and readings have improved, on average 120s/70s with pulse average in the 70s. Pt continues to monitor sodium intake.  States feels better since her blood pressure has improved, does not feel as tired and is not waking up at night feeling hot like her blood pressure is up.  Is sleeping better.  Mood: Reports has noticed a difference with the increased dose of Prozac.  Continues to worry every day but anxiety is more manageable.  Continues with therapy sessions and started with a new therapist which she seems to like better.  Denies SI/HI.  Shortness of breath: Patient reports albuterol inhaler helps with shortness of breath. On a good day will use it twice a day.  On days she is doing more activities will need to use it every  6 hours.  Reports shortness of breath is about the same since she started metoprolol.  Denies worsening or increasing shortness of breath.    GAD 7 : Generalized Anxiety Score 10/25/2020 09/13/2020 01/02/2020 11/02/2019  Nervous, Anxious, on Edge 0 3 1 0  Control/stop worrying 3 3 3 3   Worry too much - different things 3 3 3 3   Trouble relaxing 0 2 3 0  Restless 0 0 1 0  Easily annoyed or irritable 0 0 1 1  Afraid - awful might happen 3 1 0 0  Total GAD 7 Score 9 12 12 7   Anxiety Difficulty Not difficult at all Very difficult Somewhat difficult Not difficult at all    Depression screen [facility] 2/9 10/25/2020 09/13/2020 06/04/2020 01/02/2020 11/02/2019  Decreased Interest 1 3 1 1 1   Down, Depressed, Hopeless 1 3 3 3 3   PHQ - 2 Score 2 6 4 4 4   Altered sleeping 1 3 3 3 3   Tired, decreased energy 1 3 3 3 3   Change in appetite 0 1 0 0 0  Feeling bad or failure about yourself  1 3 3 3 3   Trouble concentrating 0 0 1 0 0  Moving slowly or fidgety/restless 0 0 0 0 0  Suicidal thoughts 0 0 0 1 0  PHQ-9 Score 5 16 14 14 13   Difficult doing work/chores Somewhat difficult Very difficult - Extremely dIfficult Not difficult at all  Some recent data might be hidden      Impression and Recommendations:     1. Adjustment disorder with mixed anxiety  and depressed mood   2. Anxiety   3. Shortness of breath   4. Essential hypertension   5. Pulmonary nodule, right     Anxiety, Adjustment disorder with mixed anxiety and depressed mood: -GAD-7 score of 9 and PHQ-9 score of 5, both have improved from prior. -We will continue with current medication regimen. -Recommend to continue with therapy sessions. -Will continue to monitor.  Essential hypertension: -Improved and at goal. Will continue current medication regimen. -Advised to schedule lab visit for FBW including CMP for medication monitoring. Patient relies on transportation and was not able to come in as discussed at last telehealth visit. -Continue  ambulatory BP/pulse monitoring. -Will continue to monitor.  Shortness of breath: -Continue albuterol as needed. -Discussed with patient chest CT from 02/27/2019 which revealed pulmonary nodule of RUQ measuring 6 mm avg diameter and follow up imaging was recommended at 6-12 months. No follow up image is noted. Recent CXR 09/12/2020 revealed no pulmonary nodule or mass. Recommend referral to pulmonology for further evaluation and patient is agreeable.   - As part of my medical decision making, I reviewed the following data within the [place] History obtained from pt /family, CMA notes reviewed and incorporated if applicable, Labs reviewed, Radiograph/ tests reviewed if applicable and OV notes from prior OV's with me, as well as any other specialists she/he has seen since seeing me last, were all reviewed and used in my medical decision making process today.    - Additionally, when appropriate, discussion had with patient regarding our treatment plan, and their biases/concerns about that plan were used in my medical decision making today.    - The patient agreed with the plan and demonstrated an understanding of the instructions.   No barriers to understanding were identified.     - The patient was advised to call back or seek an in-person evaluation if the symptoms worsen or if the condition fails to improve as anticipated.   Return in about 3 months (around 01/25/2021) for Mood, HTN.    Orders Placed This Encounter  Procedures   Ambulatory referral to Pulmonology    No orders of the defined types were placed in this encounter.   There are no discontinued medications.     Time spent on telephone encounter was 17 minutes.   Note:  This note was prepared with assistance of Dragon voice recognition software. Occasional wrong-word or sound-a-like substitutions may have occurred due to the inherent limitations of voice recognition software.    The [place] was signed into law in 2016 which includes the topic of electronic health records.  This provides immediate access to information in MyChart.  This includes consultation notes, operative notes, office notes, lab results and pathology reports.  If you have any questions about what you read please let us know at your next visit or call us at the office.  We are right here with you.   __________________________________________________________________________________     Patient Care Team    Relationship Specialty Notifications Start End  [name], [place] PCP - General Physician Assistant  10/24/19   Daneil Dolin, MD Consulting Physician Gastroenterology  03/08/14   Leandrew Koyanagi, MD Attending Physician Orthopedic Surgery  06/23/16   Hurley Cisco, MD Consulting Physician Rheumatology  06/23/16      -Vitals obtained; medications/ allergies reconciled;  personal medical, social, Sx etc.histories were updated by CMA, reviewed by me and are reflected in chart   Patient Active Problem  List   Diagnosis Date Noted   Anxiety 11/02/2019   B12 deficiency 11/02/2019   Adjustment disorder with mixed anxiety and depressed mood 08/22/2019   Iron deficiency anemia 08/22/2019   Gastroesophageal reflux disease 08/22/2019   Chronic joint pain 08/22/2019   Stage 3 chronic kidney disease ([place]) 08/22/2019   Obesity (BMI 35.0-39.9 without comorbidity) 02/28/2019   Normocytic anemia, not due to blood loss 02/28/2019   Restless legs syndrome (RLS) 06/30/2018   Arthrosis of first carpometacarpal joint 02/11/2018   Primary osteoarthritis of first carpometacarpal joint of left hand    Pain in left hand 12/23/2017   Healthcare maintenance 11/16/2017   Tinea versicolor 11/16/2017   Chronic right shoulder pain 09/02/2017   Fibromyalgia 08/11/2017   Family history of diabetes mellitus 06/23/2016   Other fatigue 06/23/2016   Insomnia 06/23/2016   Rheumatoid arthritis ([place]) 06/23/2016   Status  post right partial knee replacement 09/15/2014   Status post total knee replacement using cement 06/08/2014   Screening for colon cancer    Schatzki's ring    Hiatal hernia    Dysphagia 05/26/2014   Encounter for screening colonoscopy 05/26/2014   Adrenal adenoma 05/01/2014   Breast lump 05/01/2014   Metatarsalgia of both feet 01/12/2014   Bilateral leg pain 01/11/2014   Bilateral edema of lower extremity 01/11/2014   Other osteoarthritis of spine, thoracolumbar region 12/26/2013   Hypercholesteremia 05/19/2013   Periodic health assessment, general screening, adult 04/13/2013   GERD (gastroesophageal reflux disease) 04/13/2013   Essential hypertension 04/13/2013   Dyspnea 04/13/2013     Current Meds  Medication Sig   albuterol (VENTOLIN HFA) 108 (90 Base) MCG/ACT inhaler Inhale 2 puffs into the lungs every 6 (six) hours as needed for wheezing or shortness of breath.   FLUoxetine (PROZAC) 20 MG tablet Take 1.5 tablets (30 mg total) by mouth daily. Take 1.5 tablet by mouth daily.   hydrochlorothiazide (HYDRODIURIL) 50 MG tablet Take 1 tablet by mouth once daily   lisinopril (ZESTRIL) 40 MG tablet Take 1 tablet by mouth once daily   metoprolol succinate (TOPROL-XL) 25 MG 24 hr tablet Take 1 tablet (25 mg total) by mouth daily.   omeprazole (PRILOSEC) 20 MG capsule Take 1 capsule by mouth once daily     Allergies:  Allergies  Allergen Reactions   Tramadol Itching    "bugs crawling all over"     ROS:  See above HPI for pertinent positives and negatives   Objective:   Blood pressure 123/79, pulse 70, height 5' 3.5" (1.613 m), weight 224 lb (101.6 kg), last menstrual period 02/07/2013.   (if some vitals are omitted, this means that patient was UNABLE to obtain them. ) General: A & O * 3; sounds in no acute distress  Respiratory: speaking in full sentences, no conversational dyspnea Psych: insight appears good, mood- appears full

## 2020-10-30 ENCOUNTER — Encounter: Payer: Self-pay | Admitting: Gastroenterology

## 2020-11-05 ENCOUNTER — Telehealth: Payer: Self-pay | Admitting: Gastroenterology

## 2020-11-05 NOTE — Telephone Encounter (Signed)
Pls call pt. She would like to r/s manometry that is scheduled on 11/14/20 due to transportation issues. She stated that the first week of each month is difficult for her ride to take off of work.

## 2020-11-05 NOTE — Telephone Encounter (Signed)
Contacted the patient. Moved her case to 12/03/20 at 12:30 pm. She is asked to arrive at 12:00 pm. Case [phone]

## 2020-11-06 ENCOUNTER — Ambulatory Visit: Payer: Medicare Other | Admitting: Internal Medicine

## 2020-11-09 ENCOUNTER — Encounter: Payer: Medicare Other | Admitting: Thoracic Surgery (Cardiothoracic Vascular Surgery)

## 2020-11-27 ENCOUNTER — Telehealth: Payer: Self-pay | Admitting: Gastroenterology

## 2020-11-27 ENCOUNTER — Other Ambulatory Visit: Payer: Self-pay | Admitting: Physician Assistant

## 2020-11-27 DIAGNOSIS — I10 Essential (primary) hypertension: Secondary | ICD-10-CM

## 2020-11-27 NOTE — Telephone Encounter (Signed)
Called the patient to ask if she wanted to reschedule her  esophageal mano she is canceling for 12/03/20. No answer. Left her the message. Called [employer]. Canceled case #[id number].

## 2020-11-27 NOTE — Telephone Encounter (Signed)
Patient calling to cancel appt scheduled  for ENDOL at [facility] on 12/03/20 at 12:30pm.. Plz advise thanks

## 2020-11-30 ENCOUNTER — Ambulatory Visit: Payer: Medicaid Other

## 2020-12-03 ENCOUNTER — Encounter (HOSPITAL_COMMUNITY): Admission: RE | Payer: Self-pay | Source: Home / Self Care

## 2020-12-03 ENCOUNTER — Ambulatory Visit (HOSPITAL_COMMUNITY): Admission: RE | Admit: 2020-12-03 | Payer: Medicare Other | Source: Home / Self Care | Admitting: Gastroenterology

## 2020-12-03 SURGERY — MANOMETRY, ESOPHAGUS
Anesthesia: Choice

## 2020-12-07 ENCOUNTER — Other Ambulatory Visit: Payer: Self-pay

## 2020-12-07 ENCOUNTER — Institutional Professional Consult (permissible substitution) (INDEPENDENT_AMBULATORY_CARE_PROVIDER_SITE_OTHER): Payer: Medicare Other | Admitting: Thoracic Surgery (Cardiothoracic Vascular Surgery)

## 2020-12-07 ENCOUNTER — Telehealth: Payer: Self-pay | Admitting: Physician Assistant

## 2020-12-07 ENCOUNTER — Encounter: Payer: Self-pay | Admitting: *Deleted

## 2020-12-07 ENCOUNTER — Encounter: Payer: Self-pay | Admitting: Thoracic Surgery (Cardiothoracic Vascular Surgery)

## 2020-12-07 ENCOUNTER — Other Ambulatory Visit: Payer: Self-pay | Admitting: *Deleted

## 2020-12-07 VITALS — BP 135/63 | Resp 20 | Ht 63.5 in | Wt 224.0 lb

## 2020-12-07 DIAGNOSIS — K449 Diaphragmatic hernia without obstruction or gangrene: Secondary | ICD-10-CM

## 2020-12-07 DIAGNOSIS — Z01818 Encounter for other preprocedural examination: Secondary | ICD-10-CM

## 2020-12-07 DIAGNOSIS — R1010 Upper abdominal pain, unspecified: Secondary | ICD-10-CM

## 2020-12-07 NOTE — H&P (View-Only) (Signed)
[address]       [place] [address]             [id number]                    Rosalea M Kreiser  Medical Record [id number] Date of Birth: [birth year]  Referring: Mauri Pole, MD Primary Care: Lorrene Reid, PA-C Primary Cardiologist: None  Chief Complaint:    Chief Complaint  Patient presents with   Hiatal Hernia    Surgical consult    History of Present Illness:    DIETRA Baker 60 y.o. female referred for surgical evaluation of a large paraesophageal hernia.  She has had significant symptoms for over 10 years.  She was previously scheduled to undergo repair at [facility] but this was canceled due to lack of insurance.  She currently has reflux on a daily basis and is unable to go without her omeprazole.  She also complains of significant dysphagia, and nausea.  After most meals she often has to force herself to throw up due to pain.  She is only able to eat small meals.  She also complains of dyspnea on exertion.    She was recently prescribed an inhaler but this also does not improve her shortness of breath.  She denies any weight loss.      Zubrod Score: At the time of surgery this patient's most appropriate activity status/level should be described as: '[]'$     0    Normal activity, no symptoms '[x]'$     1    Restricted in physical strenuous activity but ambulatory, able to do out light work '[]'$     2    Ambulatory and capable of self care, unable to do work activities, up and about               >50 % of waking hours                              '[]'$     3    Only limited self care, in bed greater than 50% of waking hours '[]'$     4    Completely disabled, no self care, confined to bed or chair '[]'$     5    Moribund   Past Medical History:  Diagnosis Date   Acid reflux    takes Zantac and Omeprazole daily   Anemia    Anxiety    takes Citaopram daily   Arthritis    right knee   Arthrosis    left thumb CMC   Chronic back pain    DDD    Clotting disorder (HCC)    hx of blood clot following knee scope   Depression    History of bronchitis 3+yrs ago   Hyperlipidemia    takes Atorvastatin daily   Hypertension    takes Lisinopril and HCTZ daily   Insomnia    takes Elavil nightly as needed   Paraesophageal hernia     Past Surgical History:  Procedure Laterality Date   BREAST BIOPSY Left    BUNIONECTOMY Right 02/18/2018   Procedure: Yehuda Budd;  Surgeon: Edrick Kins, DPM;  Location: [place];  Service: Podiatry;  Laterality: Right;   CAPSULOTOMY Bilateral 02/18/2018   Procedure: CAPSULOTOMY MPJ RELEASE JOINT 2N BILATERAL;  Surgeon: Edrick Kins, DPM;  Location: [place];  Service: Podiatry;  Laterality:  Bilateral;   CARPOMETACARPEL SUSPENSION PLASTY Left 01/27/2018   Procedure: LEFT THUMB ligament reconstruction and tendon interposition;  Surgeon: Leandrew Koyanagi, MD;  Location: [place];  Service: Orthopedics;  Laterality: Left;   CARPOMETACARPEL SUSPENSION PLASTY Left 03/07/2020   Procedure: REVISION LEFT THUMB CARPOMETACARPAL ([place]) ARTHROPLASTY;  Surgeon: Leandrew Koyanagi, MD;  Location: [place];  Service: Orthopedics;  Laterality: Left;   CHONDROPLASTY Right 06/28/2014   Procedure: CHONDROPLASTY;  Surgeon: Marianna Payment, MD;  Location: [place];  Service: Orthopedics;  Laterality: Right;   COLONOSCOPY N/A 05/29/2014   Procedure: COLONOSCOPY;  Surgeon: Daneil Dolin, MD;  [facility];  Service: Endoscopy;  Laterality: N/A;  215pm- Pt is working until 12:00 so she can't come any earlier   ESOPHAGOGASTRODUODENOSCOPY N/A 05/29/2014   Procedure: ESOPHAGOGASTRODUODENOSCOPY (EGD);  Surgeon: Daneil Dolin, MD;  [facility];  Service: Endoscopy;  Laterality: N/A;   GANGLION CYST EXCISION Left 01/13/2002   HAMMER TOE SURGERY Bilateral 02/18/2018   Procedure: HAMMER TOE CORRECTION2ND BILATERAL;  Surgeon: Edrick Kins, DPM;  Location: [place];   Service: Podiatry;  Laterality: Bilateral;   KNEE ARTHROSCOPY WITH MEDIAL MENISECTOMY Right 06/28/2014   Procedure: RIGHT KNEE ARTHROSCOPY WITH PARTIAL MEDIAL MENISCECTOMY AND CHONDROPLASTY;  Surgeon: Marianna Payment, MD;  Location: [place];  Service: Orthopedics;  Laterality: Right;   MALONEY DILATION N/A 05/29/2014   Procedure: Venia Minks DILATION;  Surgeon: Daneil Dolin, MD;  [facility];  Service: Endoscopy;  Laterality: N/A;   PARTIAL KNEE ARTHROPLASTY Right 09/15/2014   Procedure: RIGHT UNICOMPARTMENTAL KNEE ARTHROPLASTY;  Surgeon: Leandrew Koyanagi, MD;  Location: [place];  Service: Orthopedics;  Laterality: Right;   SHOULDER ARTHROSCOPY Right    TOTAL KNEE ARTHROPLASTY Left 04/08/2004    Family History  Problem Relation Age of Onset   Cancer Father        lung   Lung cancer Father    GI problems Father    Cancer Sister        breast   Breast cancer Sister 92   Cancer Maternal Grandfather        lung   Lung cancer Maternal Grandfather    Cancer Paternal Grandfather        lung   Lung cancer Paternal Grandfather    Diabetes Son    Colon cancer Neg Hx    Esophageal cancer Neg Hx    Stomach cancer Neg Hx    Pancreatic cancer Neg Hx      Social History   Tobacco Use  Smoking Status Never  Smokeless Tobacco Never    Social History   Substance and Sexual Activity  Alcohol Use No   Alcohol/week: 0.0 standard drinks     Allergies  Allergen Reactions   Tramadol Itching    "bugs crawling all over"    Current Outpatient Medications  Medication Sig Dispense Refill   albuterol (VENTOLIN HFA) 108 (90 Base) MCG/ACT inhaler Inhale 2 puffs into the lungs every 6 (six) hours as needed for wheezing or shortness of breath. 8 g 0   FLUoxetine (PROZAC) 20 MG tablet Take 1.5 tablets (30 mg total) by mouth daily. Take 1.5 tablet by mouth daily. 135 tablet 0   hydrochlorothiazide (HYDRODIURIL) 50 MG tablet Take 1 tablet by mouth once daily 90 tablet 0    lisinopril (ZESTRIL) 40 MG tablet Take 1 tablet by mouth once daily 90 tablet 0   metoprolol  succinate (TOPROL-XL) 25 MG 24 hr tablet Take 1 tablet (25 mg total) by mouth daily. 90 tablet 0   omeprazole (PRILOSEC) 20 MG capsule Take 1 capsule by mouth once daily 90 capsule 0   No current facility-administered medications for this visit.    Review of Systems  Constitutional:  Negative for fever, malaise/fatigue and weight loss.  Respiratory:  Positive for shortness of breath. Negative for cough.   Cardiovascular:  Negative for chest pain.  Gastrointestinal:  Positive for heartburn, nausea and vomiting.  Musculoskeletal:  Positive for joint pain.  Neurological: Negative.     PHYSICAL EXAMINATION: BP 135/63   Resp 20   Ht 5' 3.5" (1.613 m)   Wt 224 lb (101.6 kg)   LMP 02/07/2013   SpO2 97% Comment: RA  BMI 39.06 kg/m  Physical Exam Constitutional:      General: She is not in acute distress.    Appearance: Normal appearance. She is normal weight. She is not ill-appearing.  HENT:     Head: Normocephalic and atraumatic.  Eyes:     Extraocular Movements: Extraocular movements intact.  Cardiovascular:     Rate and Rhythm: Normal rate.  Pulmonary:     Effort: Pulmonary effort is normal. No respiratory distress.  Abdominal:     General: Abdomen is flat. There is no distension.       Comments: From bellybutton ring  Musculoskeletal:        General: Normal range of motion.  Skin:    General: Skin is warm and dry.  Neurological:     General: No focal deficit present.     Mental Status: She is alert and oriented to person, place, and time.    Diagnostic Studies & Laboratory data:    CT Scan: CT chest from 2020 was reviewed.  She has a large paraesophageal hernia with most of the content in her left pleural space.  She also had a 6 mm pulmonary nodule.  EGD/EUS: 10/16/2020. - No endoscopic abnormality was evident in the esophagus to explain the patient's complaint of  dysphagia. Findings: - The Z-line was regular and was found 36 cm from the incisors. - A large hiatal hernia was present. - The stomach was otherwise normal. - The cardia and gastric fundus were normal on retroflexion. - The examined duodenum was normal.      I have independently reviewed the above radiology studies  and reviewed the findings with the patient.   Recent Lab Findings: Lab Results  Component Value Date   WBC 4.7 06/04/2020   HGB 11.9 06/04/2020   HCT 36.1 06/04/2020   PLT 357 06/04/2020   GLUCOSE 103 (H) 06/04/2020   CHOL 236 (H) 06/29/2018   TRIG 122 06/29/2018   HDL 57 06/29/2018   LDLCALC 155 (H) 06/29/2018   ALT 19 06/04/2020   AST 26 06/04/2020   NA 139 06/04/2020   K 3.5 06/04/2020   CL 98 06/04/2020   CREATININE 1.17 (H) 06/04/2020   BUN 15 06/04/2020   CO2 26 06/04/2020   TSH 1.720 08/22/2019   INR 0.95 09/07/2014   HGBA1C 5.8 (H) 08/22/2019      Assessment / Plan:   60 year old female with a large hiatal hernia.  She also has significant reflux, and dysphagia.  Of concern is the fact that she must induce vomiting to treat her symptoms.  Cross-sectional imaging was from over 2 years ago, thus I will repeat his CT scan.  We will also take a look at  this 6 mm pulmonary nodule that was noted.  We discussed the risks and benefits of upper endoscopy, robotic assisted laparoscopy with hiatal hernia repair, and fundoplication.  Her BMI is 39 thus I will use an absorbable mesh.  She is agree to proceed and is tentatively scheduled for August 8.   I  spent 40 minutes with the patient face to face counseling and coordination of care.    Lajuana Matte 12/07/2020 10:29 AM

## 2020-12-07 NOTE — Telephone Encounter (Signed)
   [name] DOB: 11-28-1960 MRN: [id number]   [facility] WAIVER AND RELEASE OF LIABILITY  For purposes of improving physical access to our facilities, [facility] is pleased to partner with third parties to provide [name] patients or other authorized individuals the option of convenient, on-demand ground transportation services (the [employer]") through use of the technology service that enables users to request on-demand ground transportation from independent third-party providers.  By opting to use and accept these [employer], I, the undersigned, hereby agree on behalf of myself, and on behalf of any minor child using the Government social research officer for whom I am the parent or legal guardian, as follows:  Government social research officer provided to me are provided by independent third-party transportation providers who are not [employer] or employees and who are unaffiliated with [employer]. [place] is neither a transportation carrier nor a common or public carrier. [place] has no control over the quality or safety of the transportation that occurs as a result of the [employer]. [facility] cannot guarantee that any third-party transportation provider will complete any arranged transportation service. [facility] makes no representation, warranty, or guarantee regarding the reliability, timeliness, quality, safety, suitability, or availability of any of the Transport Services or that they will be error free. I fully understand that traveling by vehicle involves risks and dangers of serious bodily injury, including permanent disability, paralysis, and death. I agree, on behalf of myself and on behalf of any minor child using the Transport Services for whom I am the parent or legal guardian, that the entire risk arising out of my use of the [employer] remains solely with me, to the maximum extent permitted under applicable law. The [employer] are provided "as is"  and "as available." [place] disclaims all representations and warranties, express, implied or statutory, not expressly set out in these terms, including the implied warranties of merchantability and fitness for a particular purpose. I hereby waive and release [place], its agents, employees, officers, directors, representatives, insurers, attorneys, assigns, successors, subsidiaries, and affiliates from any and all past, present, or future claims, demands, liabilities, actions, causes of action, or suits of any kind directly or indirectly arising from acceptance and use of the [employer]. I further waive and release [place] and its affiliates from all present and future liability and responsibility for any injury or death to persons or damages to property caused by or related to the use of the [employer]. I have read this Waiver and Release of Liability, and I understand the terms used in it and their legal significance. This Waiver is freely and voluntarily given with the understanding that my right (as well as the right of any minor child for whom I am the parent or legal guardian using the [employer]) to legal recourse against [place] in connection with the [employer] is knowingly surrendered in return for use of these services.   I attest that I read the consent document to [name], gave Ms. Whalin the opportunity to ask questions and answered the questions asked (if any). I affirm that [name] then provided consent for she's participation in this program.    Legrand Pitts

## 2020-12-07 NOTE — Progress Notes (Signed)
[address]       [place] [address]             [id number]                    Rin M Beringer [place] Medical Record [id number] Date of Birth: 11-Nov-1960  Referring: Mauri Pole, MD Primary Care: Lorrene Reid, PA-C Primary Cardiologist: None  Chief Complaint:    Chief Complaint  Patient presents with   Hiatal Hernia    Surgical consult    History of Present Illness:    [name] 60 y.o. female referred for surgical evaluation of a large paraesophageal hernia.  She has had significant symptoms for over 10 years.  She was previously scheduled to undergo repair at [facility] but this was canceled due to lack of insurance.  She currently has reflux on a daily basis and is unable to go without her omeprazole.  She also complains of significant dysphagia, and nausea.  After most meals she often has to force herself to throw up due to pain.  She is only able to eat small meals.  She also complains of dyspnea on exertion.    She was recently prescribed an inhaler but this also does not improve her shortness of breath.  She denies any weight loss.      Zubrod Score: At the time of surgery this patient's most appropriate activity status/level should be described as: '[]'$     0    Normal activity, no symptoms '[x]'$     1    Restricted in physical strenuous activity but ambulatory, able to do out light work '[]'$     2    Ambulatory and capable of self care, unable to do work activities, up and about               >50 % of waking hours                              '[]'$     3    Only limited self care, in bed greater than 50% of waking hours '[]'$     4    Completely disabled, no self care, confined to bed or chair '[]'$     5    Moribund   Past Medical History:  Diagnosis Date   Acid reflux    takes Zantac and Omeprazole daily   Anemia    Anxiety    takes Citaopram daily   Arthritis    right knee   Arthrosis    left thumb CMC   Chronic back pain    DDD    Clotting disorder (HCC)    hx of blood clot following knee scope   Depression    History of bronchitis 3+yrs ago   Hyperlipidemia    takes Atorvastatin daily   Hypertension    takes Lisinopril and HCTZ daily   Insomnia    takes Elavil nightly as needed   Paraesophageal hernia     Past Surgical History:  Procedure Laterality Date   BREAST BIOPSY Left    BUNIONECTOMY Right 02/18/2018   Procedure: Yehuda Budd;  Surgeon: Edrick Kins, DPM;  Location: [place];  Service: Podiatry;  Laterality: Right;   CAPSULOTOMY Bilateral 02/18/2018   Procedure: CAPSULOTOMY MPJ RELEASE JOINT 2N BILATERAL;  Surgeon: Edrick Kins, DPM;  Location: [place];  Service: Podiatry;  Laterality:  Bilateral;   CARPOMETACARPEL SUSPENSION PLASTY Left 01/27/2018   Procedure: LEFT THUMB ligament reconstruction and tendon interposition;  Surgeon: Leandrew Koyanagi, MD;  Location: [place];  Service: Orthopedics;  Laterality: Left;   CARPOMETACARPEL SUSPENSION PLASTY Left 03/07/2020   Procedure: REVISION LEFT THUMB CARPOMETACARPAL ([place]) ARTHROPLASTY;  Surgeon: Leandrew Koyanagi, MD;  Location: [place];  Service: Orthopedics;  Laterality: Left;   CHONDROPLASTY Right 06/28/2014   Procedure: CHONDROPLASTY;  Surgeon: Marianna Payment, MD;  Location: [place];  Service: Orthopedics;  Laterality: Right;   COLONOSCOPY N/A 05/29/2014   Procedure: COLONOSCOPY;  Surgeon: Daneil Dolin, MD;  [facility];  Service: Endoscopy;  Laterality: N/A;  215pm- Pt is working until 12:00 so she can't come any earlier   ESOPHAGOGASTRODUODENOSCOPY N/A 05/29/2014   Procedure: ESOPHAGOGASTRODUODENOSCOPY (EGD);  Surgeon: Daneil Dolin, MD;  [facility];  Service: Endoscopy;  Laterality: N/A;   GANGLION CYST EXCISION Left 01/13/2002   HAMMER TOE SURGERY Bilateral 02/18/2018   Procedure: HAMMER TOE CORRECTION2ND BILATERAL;  Surgeon: Edrick Kins, DPM;  Location: [place];   Service: Podiatry;  Laterality: Bilateral;   KNEE ARTHROSCOPY WITH MEDIAL MENISECTOMY Right 06/28/2014   Procedure: RIGHT KNEE ARTHROSCOPY WITH PARTIAL MEDIAL MENISCECTOMY AND CHONDROPLASTY;  Surgeon: Marianna Payment, MD;  Location: [place];  Service: Orthopedics;  Laterality: Right;   MALONEY DILATION N/A 05/29/2014   Procedure: Venia Minks DILATION;  Surgeon: Daneil Dolin, MD;  [facility];  Service: Endoscopy;  Laterality: N/A;   PARTIAL KNEE ARTHROPLASTY Right 09/15/2014   Procedure: RIGHT UNICOMPARTMENTAL KNEE ARTHROPLASTY;  Surgeon: Leandrew Koyanagi, MD;  Location: [place];  Service: Orthopedics;  Laterality: Right;   SHOULDER ARTHROSCOPY Right    TOTAL KNEE ARTHROPLASTY Left 04/08/2004    Family History  Problem Relation Age of Onset   Cancer Father        lung   Lung cancer Father    GI problems Father    Cancer Sister        breast   Breast cancer Sister 87   Cancer Maternal Grandfather        lung   Lung cancer Maternal Grandfather    Cancer Paternal Grandfather        lung   Lung cancer Paternal Grandfather    Diabetes Son    Colon cancer Neg Hx    Esophageal cancer Neg Hx    Stomach cancer Neg Hx    Pancreatic cancer Neg Hx      Social History   Tobacco Use  Smoking Status Never  Smokeless Tobacco Never    Social History   Substance and Sexual Activity  Alcohol Use No   Alcohol/week: 0.0 standard drinks     Allergies  Allergen Reactions   Tramadol Itching    "bugs crawling all over"    Current Outpatient Medications  Medication Sig Dispense Refill   albuterol (VENTOLIN HFA) 108 (90 Base) MCG/ACT inhaler Inhale 2 puffs into the lungs every 6 (six) hours as needed for wheezing or shortness of breath. 8 g 0   FLUoxetine (PROZAC) 20 MG tablet Take 1.5 tablets (30 mg total) by mouth daily. Take 1.5 tablet by mouth daily. 135 tablet 0   hydrochlorothiazide (HYDRODIURIL) 50 MG tablet Take 1 tablet by mouth once daily 90 tablet 0    lisinopril (ZESTRIL) 40 MG tablet Take 1 tablet by mouth once daily 90 tablet 0   metoprolol  succinate (TOPROL-XL) 25 MG 24 hr tablet Take 1 tablet (25 mg total) by mouth daily. 90 tablet 0   omeprazole (PRILOSEC) 20 MG capsule Take 1 capsule by mouth once daily 90 capsule 0   No current facility-administered medications for this visit.    Review of Systems  Constitutional:  Negative for fever, malaise/fatigue and weight loss.  Respiratory:  Positive for shortness of breath. Negative for cough.   Cardiovascular:  Negative for chest pain.  Gastrointestinal:  Positive for heartburn, nausea and vomiting.  Musculoskeletal:  Positive for joint pain.  Neurological: Negative.     PHYSICAL EXAMINATION: BP 135/63   Resp 20   Ht 5' 3.5" (1.613 m)   Wt 224 lb (101.6 kg)   LMP 02/07/2013   SpO2 97% Comment: RA  BMI 39.06 kg/m  Physical Exam Constitutional:      General: She is not in acute distress.    Appearance: Normal appearance. She is normal weight. She is not ill-appearing.  HENT:     Head: Normocephalic and atraumatic.  Eyes:     Extraocular Movements: Extraocular movements intact.  Cardiovascular:     Rate and Rhythm: Normal rate.  Pulmonary:     Effort: Pulmonary effort is normal. No respiratory distress.  Abdominal:     General: Abdomen is flat. There is no distension.       Comments: From bellybutton ring  Musculoskeletal:        General: Normal range of motion.  Skin:    General: Skin is warm and dry.  Neurological:     General: No focal deficit present.     Mental Status: She is alert and oriented to person, place, and time.    Diagnostic Studies & Laboratory data:    CT Scan: CT chest from 2020 was reviewed.  She has a large paraesophageal hernia with most of the content in her left pleural space.  She also had a 6 mm pulmonary nodule.  EGD/EUS: 10/16/2020. - No endoscopic abnormality was evident in the esophagus to explain the patient's complaint of  dysphagia. Findings: - The Z-line was regular and was found 36 cm from the incisors. - A large hiatal hernia was present. - The stomach was otherwise normal. - The cardia and gastric fundus were normal on retroflexion. - The examined duodenum was normal.      I have independently reviewed the above radiology studies  and reviewed the findings with the patient.   Recent Lab Findings: Lab Results  Component Value Date   WBC 4.7 06/04/2020   HGB 11.9 06/04/2020   HCT 36.1 06/04/2020   PLT 357 06/04/2020   GLUCOSE 103 (H) 06/04/2020   CHOL 236 (H) 06/29/2018   TRIG 122 06/29/2018   HDL 57 06/29/2018   LDLCALC 155 (H) 06/29/2018   ALT 19 06/04/2020   AST 26 06/04/2020   NA 139 06/04/2020   K 3.5 06/04/2020   CL 98 06/04/2020   CREATININE 1.17 (H) 06/04/2020   BUN 15 06/04/2020   CO2 26 06/04/2020   TSH 1.720 08/22/2019   INR 0.95 09/07/2014   HGBA1C 5.8 (H) 08/22/2019      Assessment / Plan:   60 year old female with a large hiatal hernia.  She also has significant reflux, and dysphagia.  Of concern is the fact that she must induce vomiting to treat her symptoms.  Cross-sectional imaging was from over 2 years ago, thus I will repeat his CT scan.  We will also take a look at  this 6 mm pulmonary nodule that was noted.  We discussed the risks and benefits of upper endoscopy, robotic assisted laparoscopy with hiatal hernia repair, and fundoplication.  Her BMI is 39 thus I will use an absorbable mesh.  She is agree to proceed and is tentatively scheduled for August 8.   I  spent 40 minutes with the patient face to face counseling and coordination of care.    Lajuana Matte 12/07/2020 10:29 AM

## 2020-12-10 ENCOUNTER — Other Ambulatory Visit: Payer: Self-pay | Admitting: Physician Assistant

## 2020-12-10 ENCOUNTER — Other Ambulatory Visit: Payer: Medicare Other

## 2020-12-10 ENCOUNTER — Ambulatory Visit
Admission: RE | Admit: 2020-12-10 | Discharge: 2020-12-10 | Disposition: A | Discharge status: Home / Self Care | Payer: Medicare Other | Source: Ambulatory Visit | Attending: Thoracic Surgery (Cardiothoracic Vascular Surgery) | Admitting: Thoracic Surgery (Cardiothoracic Vascular Surgery)

## 2020-12-10 DIAGNOSIS — I10 Essential (primary) hypertension: Secondary | ICD-10-CM

## 2020-12-10 DIAGNOSIS — Z01818 Encounter for other preprocedural examination: Secondary | ICD-10-CM

## 2020-12-10 DIAGNOSIS — K449 Diaphragmatic hernia without obstruction or gangrene: Secondary | ICD-10-CM

## 2020-12-13 NOTE — Progress Notes (Signed)
Surgical Instructions    Your procedure is scheduled on Monday August 8th.  Report to [facility] Main Entrance "A" at 9 A.M., then check in with the Admitting office.  Call this number if you have problems the morning of surgery:  [phone]   If you have any questions prior to your surgery date call [phone]: Open Monday-Friday 8am-4pm    Remember:  Do not eat or drink anything after midnight the night before your surgery     Take these medicines the morning of surgery with A SIP OF WATER  FLUoxetine (PROZAC) 20 MG tablet metoprolol succinate (TOPROL-XL) 25 MG 24 hr tablet omeprazole (PRILOSEC) 20 MG capsule  IF NEEDED albuterol (VENTOLIN HFA) 108 (90 Base) MCG/ACT inhaler please bring with you to the hospital    As of today, STOP taking any Aspirin (unless otherwise instructed by your surgeon) Aleve, Naproxen, Ibuprofen, Motrin, Advil, Goody's, BC's, all herbal medications, fish oil, and all vitamins.          Do not wear jewelry or makeup Do not wear lotions, powders, perfumes, or deodorant. Do not shave 48 hours prior to surgery.   Do not bring valuables to the hospital. DO Not wear nail polish, gel polish, artificial nails, or any other type of covering on  natural nails including finger and toenails. If patients have artificial nails, gel coating, etc. that need to be removed by a nail salon please have this removed prior to surgery or surgery may need to be canceled/delayed if the surgeon/ anesthesia feels like the patient is unable to be adequately monitored.             [facility] is not responsible for any belongings or valuables.  Do NOT Smoke (Tobacco/Vaping) or drink Alcohol 24 hours prior to your procedure If you use a CPAP at night, you may bring all equipment for your overnight stay.   Contacts, glasses, dentures or bridgework may not be worn into surgery, please bring cases for these belongings   For patients admitted to the hospital, discharge time  will be determined by your treatment team.   Patients discharged the day of surgery will not be allowed to drive home, and someone needs to stay with them for 24 hours.  ONLY 1 SUPPORT PERSON MAY BE PRESENT WHILE YOU ARE IN SURGERY. IF YOU ARE TO BE ADMITTED ONCE YOU ARE IN YOUR ROOM YOU WILL BE ALLOWED TWO (2) VISITORS.  Minor children may have two parents present. Special consideration for safety and communication needs will be reviewed on a case by case basis.  Special instructions:    Oral Hygiene is also important to reduce your risk of infection.  Remember - BRUSH YOUR TEETH THE MORNING OF SURGERY WITH YOUR REGULAR TOOTHPASTE   [facility]- Preparing For Surgery  Before surgery, you can play an important role. Because skin is not sterile, your skin needs to be as free of germs as possible. You can reduce the number of germs on your skin by washing with CHG (chlorahexidine gluconate) Soap before surgery.  CHG is an antiseptic cleaner which kills germs and bonds with the skin to continue killing germs even after washing.     Please do not use if you have an allergy to CHG or antibacterial soaps. If your skin becomes reddened/irritated stop using the CHG.  Do not shave (including legs and underarms) for at least 48 hours prior to first CHG shower. It is OK to shave your face.  Please follow  these instructions carefully.     Shower the NIGHT BEFORE SURGERY and the MORNING OF SURGERY with CHG Soap.   If you chose to wash your hair, wash your hair first as usual with your normal shampoo. After you shampoo, rinse your hair and body thoroughly to remove the shampoo.  Then [employer] and genitals (private parts) with your normal soap and rinse thoroughly to remove soap.  After that Use CHG Soap as you would any other liquid soap. You can apply CHG directly to the skin and wash gently with a scrungie or a clean washcloth.   Apply the CHG Soap to your body ONLY FROM THE NECK DOWN.  Do not use on  open wounds or open sores. Avoid contact with your eyes, ears, mouth and genitals (private parts). Wash Face and genitals (private parts)  with your normal soap.   Wash thoroughly, paying special attention to the area where your surgery will be performed.  Thoroughly rinse your body with warm water from the neck down.  DO NOT shower/wash with your normal soap after using and rinsing off the CHG Soap.  Pat yourself dry with a CLEAN TOWEL.  Wear CLEAN PAJAMAS to bed the night before surgery  Place CLEAN SHEETS on your bed the night before your surgery  DO NOT SLEEP WITH PETS.   Day of Surgery:  Take a shower with CHG soap. Wear Clean/Comfortable clothing the morning of surgery Do not apply any deodorants/lotions.   Remember to brush your teeth WITH YOUR REGULAR TOOTHPASTE.   Please read over the following fact sheets that you were given.

## 2020-12-14 ENCOUNTER — Other Ambulatory Visit: Payer: Self-pay | Admitting: *Deleted

## 2020-12-14 ENCOUNTER — Encounter (HOSPITAL_COMMUNITY): Payer: Self-pay

## 2020-12-14 ENCOUNTER — Other Ambulatory Visit: Payer: Self-pay

## 2020-12-14 ENCOUNTER — Encounter (HOSPITAL_COMMUNITY)
Admission: RE | Admit: 2020-12-14 | Discharge: 2020-12-14 | Disposition: A | Discharge status: Home / Self Care | Payer: Medicare Other | Source: Ambulatory Visit | Attending: Thoracic Surgery (Cardiothoracic Vascular Surgery) | Admitting: Thoracic Surgery (Cardiothoracic Vascular Surgery)

## 2020-12-14 ENCOUNTER — Ambulatory Visit (HOSPITAL_COMMUNITY)
Admission: RE | Admit: 2020-12-14 | Discharge: 2020-12-14 | Disposition: A | Discharge status: Home / Self Care | Payer: Medicare Other | Source: Ambulatory Visit | Attending: Thoracic Surgery (Cardiothoracic Vascular Surgery) | Admitting: Thoracic Surgery (Cardiothoracic Vascular Surgery)

## 2020-12-14 DIAGNOSIS — Z01818 Encounter for other preprocedural examination: Secondary | ICD-10-CM | POA: Insufficient documentation

## 2020-12-14 DIAGNOSIS — K449 Diaphragmatic hernia without obstruction or gangrene: Secondary | ICD-10-CM | POA: Diagnosis not present

## 2020-12-14 DIAGNOSIS — R1010 Upper abdominal pain, unspecified: Secondary | ICD-10-CM

## 2020-12-14 DIAGNOSIS — Z20822 Contact with and (suspected) exposure to covid-19: Secondary | ICD-10-CM | POA: Insufficient documentation

## 2020-12-14 LAB — URINALYSIS, ROUTINE W REFLEX MICROSCOPIC
Bilirubin Urine: NEGATIVE
Glucose, UA: NEGATIVE mg/dL
Hgb urine dipstick: NEGATIVE
Ketones, ur: NEGATIVE mg/dL
Nitrite: NEGATIVE
Protein, ur: NEGATIVE mg/dL
Specific Gravity, Urine: 1.015 (ref 1.005–1.030)
pH: 6 (ref 5.0–8.0)

## 2020-12-14 LAB — COMPREHENSIVE METABOLIC PANEL
ALT: 20 U/L (ref 0–44)
AST: 23 U/L (ref 15–41)
Albumin: 3.6 g/dL (ref 3.5–5.0)
Alkaline Phosphatase: 78 U/L (ref 38–126)
Anion gap: 12 (ref 5–15)
BUN: 16 mg/dL (ref 6–20)
CO2: 23 mmol/L (ref 22–32)
Calcium: 9.7 mg/dL (ref 8.9–10.3)
Chloride: 99 mmol/L (ref 98–111)
Creatinine, Ser: 1.2 mg/dL — ABNORMAL HIGH (ref 0.44–1.00)
GFR, Estimated: 52 mL/min — ABNORMAL LOW (ref >60)
Glucose, Bld: 97 mg/dL (ref 70–99)
Potassium: 3.6 mmol/L (ref 3.5–5.1)
Sodium: 134 mmol/L — ABNORMAL LOW (ref 135–145)
Total Bilirubin: 0.5 mg/dL (ref 0.3–1.2)
Total Protein: 7 g/dL (ref 6.5–8.1)

## 2020-12-14 LAB — PROTIME-INR
INR: 1 (ref 0.8–1.2)
Prothrombin Time: 12.7 seconds (ref 11.4–15.2)

## 2020-12-14 LAB — BLOOD GAS, ARTERIAL
Acid-Base Excess: 1.2 mmol/L (ref 0.0–2.0)
Bicarbonate: 24.9 mmol/L (ref 20.0–28.0)
Drawn by: 602861
FIO2: 21
O2 Saturation: 96.8 %
Patient temperature: 37
pCO2 arterial: 36.8 mmHg (ref 32.0–48.0)
pH, Arterial: 7.445 (ref 7.350–7.450)
pO2, Arterial: 87.2 mmHg (ref 83.0–108.0)

## 2020-12-14 LAB — CBC
HCT: 34.6 % — ABNORMAL LOW (ref 36.0–46.0)
Hemoglobin: 11.3 g/dL — ABNORMAL LOW (ref 12.0–15.0)
MCH: 27.9 pg (ref 26.0–34.0)
MCHC: 32.7 g/dL (ref 30.0–36.0)
MCV: 85.4 fL (ref 80.0–100.0)
Platelets: 403 10*3/uL — ABNORMAL HIGH (ref 150–400)
RBC: 4.05 MIL/uL (ref 3.87–5.11)
RDW: 16.3 % — ABNORMAL HIGH (ref 11.5–15.5)
WBC: 10.8 10*3/uL — ABNORMAL HIGH (ref 4.0–10.5)
nRBC: 0 % (ref 0.0–0.2)

## 2020-12-14 LAB — TYPE AND SCREEN
ABO/RH(D): A POS
Antibody Screen: NEGATIVE

## 2020-12-14 LAB — SARS CORONAVIRUS 2 (TAT 6-24 HRS): SARS Coronavirus 2: NEGATIVE

## 2020-12-14 LAB — SURGICAL PCR SCREEN
MRSA, PCR: NEGATIVE
Staphylococcus aureus: NEGATIVE

## 2020-12-14 LAB — APTT: aPTT: 25 seconds (ref 24–36)

## 2020-12-14 MED ORDER — CIPROFLOXACIN HCL 500 MG PO TABS: 500.0000 mg | ORAL_TABLET | Freq: Two times a day (BID) | ORAL | 0 refills | Status: DC

## 2020-12-14 NOTE — Progress Notes (Signed)
Pt's UA from today was positive for small amount of leukocytes and many bacteria. Left voicemail for Ryan in Dr. Abran Duke office informing her of this information.

## 2020-12-14 NOTE — Progress Notes (Signed)
PCP - Lorrene Reid PA Cardiologist - denies  PPM/ICD - denies Device Orders -  Rep Notified -   Chest x-ray - 12/14/20 EKG - 12/14/20 Stress Test - none ECHO - 03/01/19 Cardiac Cath - none  Sleep Study - denies CPAP -   Fasting Blood Sugar - n/a Checks Blood Sugar _____ times a day  Blood Thinner Instructions:n/a Aspirin Instructions:n/a  ERAS Protcol -no PRE-SURGERY Ensure or G2-   COVID TEST- yes,12/14/20   Anesthesia review:no   Patient denies shortness of breath, fever, cough and chest pain at PAT appointment   All instructions explained to the patient, with a verbal understanding of the material. Patient agrees to go over the instructions while at home for a better understanding. Patient also instructed to self quarantine after being tested for COVID-19. The opportunity to ask questions was provided.

## 2020-12-17 ENCOUNTER — Telehealth: Payer: Self-pay | Admitting: Physician Assistant

## 2020-12-17 ENCOUNTER — Inpatient Hospital Stay (HOSPITAL_COMMUNITY): Payer: Medicare Other | Admitting: Anesthesiology

## 2020-12-17 ENCOUNTER — Encounter (HOSPITAL_COMMUNITY): Payer: Self-pay | Admitting: Thoracic Surgery (Cardiothoracic Vascular Surgery)

## 2020-12-17 ENCOUNTER — Other Ambulatory Visit: Payer: Self-pay

## 2020-12-17 ENCOUNTER — Observation Stay (HOSPITAL_COMMUNITY): Payer: Medicare Other

## 2020-12-17 ENCOUNTER — Inpatient Hospital Stay (HOSPITAL_COMMUNITY)
Admission: RE | Admit: 2020-12-17 | Discharge: 2020-12-19 | DRG: 328 | Disposition: A | Discharge status: Home / Self Care | Payer: Medicare Other | Attending: Thoracic Surgery (Cardiothoracic Vascular Surgery) | Admitting: Thoracic Surgery (Cardiothoracic Vascular Surgery)

## 2020-12-17 ENCOUNTER — Encounter (HOSPITAL_COMMUNITY)
Admission: RE | Disposition: A | Discharge status: Home / Self Care | Payer: Self-pay | Source: Home / Self Care | Attending: Thoracic Surgery (Cardiothoracic Vascular Surgery)

## 2020-12-17 DIAGNOSIS — Z86718 Personal history of other venous thrombosis and embolism: Secondary | ICD-10-CM

## 2020-12-17 DIAGNOSIS — M069 Rheumatoid arthritis, unspecified: Secondary | ICD-10-CM | POA: Diagnosis present

## 2020-12-17 DIAGNOSIS — K449 Diaphragmatic hernia without obstruction or gangrene: Secondary | ICD-10-CM | POA: Diagnosis not present

## 2020-12-17 DIAGNOSIS — Z01818 Encounter for other preprocedural examination: Secondary | ICD-10-CM

## 2020-12-17 DIAGNOSIS — N183 Chronic kidney disease, stage 3 unspecified: Secondary | ICD-10-CM | POA: Diagnosis present

## 2020-12-17 DIAGNOSIS — R1310 Dysphagia, unspecified: Secondary | ICD-10-CM | POA: Diagnosis present

## 2020-12-17 DIAGNOSIS — Z79899 Other long term (current) drug therapy: Secondary | ICD-10-CM

## 2020-12-17 DIAGNOSIS — Z8719 Personal history of other diseases of the digestive system: Secondary | ICD-10-CM

## 2020-12-17 DIAGNOSIS — G2581 Restless legs syndrome: Secondary | ICD-10-CM | POA: Diagnosis present

## 2020-12-17 DIAGNOSIS — Z886 Allergy status to analgesic agent status: Secondary | ICD-10-CM

## 2020-12-17 DIAGNOSIS — M797 Fibromyalgia: Secondary | ICD-10-CM | POA: Diagnosis present

## 2020-12-17 DIAGNOSIS — Z9889 Other specified postprocedural states: Secondary | ICD-10-CM

## 2020-12-17 DIAGNOSIS — R911 Solitary pulmonary nodule: Secondary | ICD-10-CM | POA: Diagnosis present

## 2020-12-17 DIAGNOSIS — I10 Essential (primary) hypertension: Secondary | ICD-10-CM | POA: Diagnosis present

## 2020-12-17 DIAGNOSIS — Z96653 Presence of artificial knee joint, bilateral: Secondary | ICD-10-CM | POA: Diagnosis present

## 2020-12-17 DIAGNOSIS — F32A Depression, unspecified: Secondary | ICD-10-CM | POA: Diagnosis present

## 2020-12-17 DIAGNOSIS — E669 Obesity, unspecified: Secondary | ICD-10-CM | POA: Diagnosis present

## 2020-12-17 DIAGNOSIS — E7800 Pure hypercholesterolemia, unspecified: Secondary | ICD-10-CM | POA: Diagnosis present

## 2020-12-17 DIAGNOSIS — Z6839 Body mass index (BMI) 39.0-39.9, adult: Secondary | ICD-10-CM

## 2020-12-17 DIAGNOSIS — K219 Gastro-esophageal reflux disease without esophagitis: Secondary | ICD-10-CM | POA: Diagnosis present

## 2020-12-17 DIAGNOSIS — G4700 Insomnia, unspecified: Secondary | ICD-10-CM | POA: Diagnosis present

## 2020-12-17 HISTORY — PX: ESOPHAGOGASTRODUODENOSCOPY: SHX5428

## 2020-12-17 HISTORY — PX: XI ROBOTIC ASSISTED PARAESOPHAGEAL HERNIA REPAIR: SHX6871

## 2020-12-17 LAB — CBC
HCT: 34.8 % — ABNORMAL LOW (ref 36.0–46.0)
Hemoglobin: 11.1 g/dL — ABNORMAL LOW (ref 12.0–15.0)
MCH: 27.5 pg (ref 26.0–34.0)
MCHC: 31.9 g/dL (ref 30.0–36.0)
MCV: 86.4 fL (ref 80.0–100.0)
Platelets: 431 10*3/uL — ABNORMAL HIGH (ref 150–400)
RBC: 4.03 MIL/uL (ref 3.87–5.11)
RDW: 17 % — ABNORMAL HIGH (ref 11.5–15.5)
WBC: 16 10*3/uL — ABNORMAL HIGH (ref 4.0–10.5)
nRBC: 0 % (ref 0.0–0.2)

## 2020-12-17 LAB — CREATININE, SERUM
Creatinine, Ser: 1.47 mg/dL — ABNORMAL HIGH (ref 0.44–1.00)
GFR, Estimated: 41 mL/min — ABNORMAL LOW (ref >60)

## 2020-12-17 SURGERY — REPAIR, HERNIA, PARAESOPHAGEAL, ROBOT-ASSISTED
Anesthesia: General | Site: Abdomen

## 2020-12-17 MED ORDER — SODIUM CHLORIDE 0.9 % IV SOLN: INTRAVENOUS | Status: DC

## 2020-12-17 MED ORDER — ROCURONIUM BROMIDE 10 MG/ML (PF) SYRINGE
PREFILLED_SYRINGE | INTRAVENOUS | Status: DC | PRN
  Administered 2020-12-17: 20 mg via INTRAVENOUS
  Administered 2020-12-17 (×2): 50 mg via INTRAVENOUS

## 2020-12-17 MED ORDER — MIDAZOLAM HCL 5 MG/5ML IJ SOLN
INTRAMUSCULAR | Status: DC | PRN
Start: 2020-12-17 — End: 2020-12-17
  Administered 2020-12-17: 2 mg via INTRAVENOUS

## 2020-12-17 MED ORDER — FENTANYL CITRATE (PF) 100 MCG/2ML IJ SOLN: 25.0000 ug | INTRAMUSCULAR | Status: DC | PRN

## 2020-12-17 MED ORDER — CHLORHEXIDINE GLUCONATE CLOTH 2 % EX PADS: 6.0000 | MEDICATED_PAD | Freq: Every day | CUTANEOUS | Status: DC

## 2020-12-17 MED ORDER — LACTATED RINGERS IV SOLN: INTRAVENOUS | Status: DC | PRN

## 2020-12-17 MED ORDER — MIDAZOLAM HCL 2 MG/2ML IJ SOLN
INTRAMUSCULAR | Status: AC
  Filled 2020-12-17: qty 2

## 2020-12-17 MED ORDER — FENTANYL CITRATE (PF) 100 MCG/2ML IJ SOLN
INTRAMUSCULAR | Status: DC | PRN
  Administered 2020-12-17: 50 ug via INTRAVENOUS
  Administered 2020-12-17: 100 ug via INTRAVENOUS
  Administered 2020-12-17: 50 ug via INTRAVENOUS

## 2020-12-17 MED ORDER — LIDOCAINE 2% (20 MG/ML) 5 ML SYRINGE
INTRAMUSCULAR | Status: DC | PRN
  Administered 2020-12-17: 60 mg via INTRAVENOUS

## 2020-12-17 MED ORDER — BUPIVACAINE HCL (PF) 0.5 % IJ SOLN
INTRAMUSCULAR | Status: AC
  Filled 2020-12-17: qty 30

## 2020-12-17 MED ORDER — ORAL CARE MOUTH RINSE: 15.0000 mL | Freq: Once | OROMUCOSAL | Status: AC

## 2020-12-17 MED ORDER — ONDANSETRON HCL 4 MG/2ML IJ SOLN
INTRAMUSCULAR | Status: DC | PRN
  Administered 2020-12-17: 4 mg via INTRAVENOUS

## 2020-12-17 MED ORDER — FENTANYL CITRATE (PF) 100 MCG/2ML IJ SOLN
25.0000 ug | INTRAMUSCULAR | Status: DC | PRN
  Administered 2020-12-17 (×3): 50 ug via INTRAVENOUS

## 2020-12-17 MED ORDER — KETOROLAC TROMETHAMINE 15 MG/ML IJ SOLN
INTRAMUSCULAR | Status: AC
  Administered 2020-12-17: 15 mg
  Filled 2020-12-17: qty 1

## 2020-12-17 MED ORDER — BUPIVACAINE LIPOSOME 1.3 % IJ SUSP
INTRAMUSCULAR | Status: AC
  Filled 2020-12-17: qty 20

## 2020-12-17 MED ORDER — CHLORHEXIDINE GLUCONATE 0.12 % MT SOLN
OROMUCOSAL | Status: AC
  Administered 2020-12-17: 15 mL via OROMUCOSAL
  Filled 2020-12-17: qty 15

## 2020-12-17 MED ORDER — PHENYLEPHRINE HCL-NACL 20-0.9 MG/250ML-% IV SOLN
INTRAVENOUS | Status: DC | PRN
  Administered 2020-12-17: 20 ug/min via INTRAVENOUS

## 2020-12-17 MED ORDER — ALBUTEROL SULFATE (2.5 MG/3ML) 0.083% IN NEBU
2.5000 mg | INHALATION_SOLUTION | Freq: Four times a day (QID) | RESPIRATORY_TRACT | Status: DC | PRN
Start: 2020-12-17 — End: 2020-12-17
  Administered 2020-12-17: 2.5 mg via RESPIRATORY_TRACT

## 2020-12-17 MED ORDER — FENTANYL CITRATE (PF) 100 MCG/2ML IJ SOLN
INTRAMUSCULAR | Status: AC
  Filled 2020-12-17: qty 2

## 2020-12-17 MED ORDER — EPINEPHRINE PF 1 MG/ML IJ SOLN
INTRAMUSCULAR | Status: AC
  Filled 2020-12-17: qty 1

## 2020-12-17 MED ORDER — LACTATED RINGERS IV SOLN: INTRAVENOUS | Status: DC

## 2020-12-17 MED ORDER — ENOXAPARIN SODIUM 40 MG/0.4ML IJ SOSY: 40.0000 mg | PREFILLED_SYRINGE | INTRAMUSCULAR | Status: DC

## 2020-12-17 MED ORDER — ALBUTEROL SULFATE (2.5 MG/3ML) 0.083% IN NEBU
INHALATION_SOLUTION | RESPIRATORY_TRACT | Status: AC
  Filled 2020-12-17: qty 3

## 2020-12-17 MED ORDER — CEFAZOLIN SODIUM-DEXTROSE 2-4 GM/100ML-% IV SOLN
2.0000 g | Freq: Three times a day (TID) | INTRAVENOUS | Status: AC
  Filled 2020-12-17: qty 100

## 2020-12-17 MED ORDER — ONDANSETRON HCL 4 MG/2ML IJ SOLN
4.0000 mg | Freq: Four times a day (QID) | INTRAMUSCULAR | Status: DC
  Administered 2020-12-17 – 2020-12-18 (×6): 4 mg via INTRAVENOUS
  Filled 2020-12-17 (×6): qty 2

## 2020-12-17 MED ORDER — BUPIVACAINE LIPOSOME 1.3 % IJ SUSP
INTRAMUSCULAR | Status: DC | PRN
  Administered 2020-12-17: 50 mL

## 2020-12-17 MED ORDER — CHLORHEXIDINE GLUCONATE 0.12 % MT SOLN: 15.0000 mL | Freq: Once | OROMUCOSAL | Status: AC

## 2020-12-17 MED ORDER — KETOROLAC TROMETHAMINE 30 MG/ML IJ SOLN
15.0000 mg | Freq: Four times a day (QID) | INTRAMUSCULAR | Status: DC | PRN
  Administered 2020-12-17 – 2020-12-18 (×3): 15 mg via INTRAVENOUS
  Filled 2020-12-17 (×3): qty 1

## 2020-12-17 MED ORDER — MIDAZOLAM HCL 2 MG/2ML IJ SOLN
0.5000 mg | Freq: Once | INTRAMUSCULAR | Status: AC
  Administered 2020-12-17: 0.5 mg via INTRAVENOUS

## 2020-12-17 MED ORDER — METOPROLOL TARTRATE 5 MG/5ML IV SOLN: 2.5000 mg | Freq: Four times a day (QID) | INTRAVENOUS | Status: DC | PRN

## 2020-12-17 MED ORDER — ALBUTEROL SULFATE (2.5 MG/3ML) 0.083% IN NEBU: 2.5000 mg | INHALATION_SOLUTION | Freq: Four times a day (QID) | RESPIRATORY_TRACT | Status: DC | PRN

## 2020-12-17 MED ORDER — ACETAMINOPHEN 650 MG RE SUPP: 650.0000 mg | RECTAL | Status: DC | PRN

## 2020-12-17 MED ORDER — SUGAMMADEX SODIUM 200 MG/2ML IV SOLN
INTRAVENOUS | Status: DC | PRN
  Administered 2020-12-17: 200 mg via INTRAVENOUS

## 2020-12-17 MED ORDER — MORPHINE SULFATE (PF) 2 MG/ML IV SOLN: 1.0000 mg | INTRAVENOUS | Status: DC | PRN

## 2020-12-17 MED ORDER — PROPOFOL 10 MG/ML IV BOLUS
INTRAVENOUS | Status: AC
  Filled 2020-12-17: qty 20

## 2020-12-17 MED ORDER — DEXAMETHASONE SODIUM PHOSPHATE 10 MG/ML IJ SOLN
INTRAMUSCULAR | Status: DC | PRN
  Administered 2020-12-17: 5 mg via INTRAVENOUS

## 2020-12-17 MED ORDER — CEFAZOLIN SODIUM-DEXTROSE 2-4 GM/100ML-% IV SOLN
2.0000 g | INTRAVENOUS | Status: AC
  Administered 2020-12-17: 2 g via INTRAVENOUS

## 2020-12-17 MED ORDER — CEFAZOLIN SODIUM-DEXTROSE 2-4 GM/100ML-% IV SOLN
INTRAVENOUS | Status: AC
  Administered 2020-12-17: 2 g via INTRAVENOUS
  Filled 2020-12-17: qty 100

## 2020-12-17 MED ORDER — 0.9 % SODIUM CHLORIDE (POUR BTL) OPTIME
TOPICAL | Status: DC | PRN
  Administered 2020-12-17: 2000 mL

## 2020-12-17 MED ORDER — FENTANYL CITRATE (PF) 250 MCG/5ML IJ SOLN
INTRAMUSCULAR | Status: AC
  Filled 2020-12-17: qty 5

## 2020-12-17 MED ORDER — PROPOFOL 10 MG/ML IV BOLUS
INTRAVENOUS | Status: DC | PRN
  Administered 2020-12-17: 50 mg via INTRAVENOUS
  Administered 2020-12-17: 150 mg via INTRAVENOUS

## 2020-12-17 MED ORDER — EPHEDRINE SULFATE-NACL 50-0.9 MG/10ML-% IV SOSY
PREFILLED_SYRINGE | INTRAVENOUS | Status: DC | PRN
  Administered 2020-12-17 (×3): 5 mg via INTRAVENOUS

## 2020-12-17 SURGICAL SUPPLY — 93 items
ADH SKN CLS APL DERMABOND .7 (GAUZE/BANDAGES/DRESSINGS) ×2
BAG SPEC RTRVL C125 8X14 (MISCELLANEOUS) ×2
BLADE CLIPPER SURG (BLADE) ×3 IMPLANT
BLADE SURG 11 STRL SS (BLADE) ×3 IMPLANT
BUTTON OLYMPUS DEFENDO 5 PIECE (MISCELLANEOUS) ×3 IMPLANT
CANISTER SUCT 3000ML PPV (MISCELLANEOUS) ×6 IMPLANT
CANNULA REDUC XI 12-8 STAPL (CANNULA)
CANNULA REDUCER 12-8 DVNC XI (CANNULA) IMPLANT
CATH THORACIC 28FR (CATHETERS) IMPLANT
CNTNR URN SCR LID CUP LEK RST (MISCELLANEOUS) ×2 IMPLANT
CONT SPEC 4OZ STRL OR WHT (MISCELLANEOUS) ×3
COVER BACK TABLE 60X90IN (DRAPES) ×3 IMPLANT
DECANTER SPIKE VIAL GLASS SM (MISCELLANEOUS) ×3 IMPLANT
DEFOGGER SCOPE WARMER CLEARIFY (MISCELLANEOUS) ×3 IMPLANT
DERMABOND ADVANCED (GAUZE/BANDAGES/DRESSINGS) ×1
DERMABOND ADVANCED .7 DNX12 (GAUZE/BANDAGES/DRESSINGS) ×4 IMPLANT
DEVICE SUTURE ENDOST 10MM (ENDOMECHANICALS) IMPLANT
DRAIN PENROSE 1/2X12 LTX STRL (WOUND CARE) ×3 IMPLANT
DRAPE ARM DVNC X/XI (DISPOSABLE) ×8 IMPLANT
DRAPE COLUMN DVNC XI (DISPOSABLE) ×2 IMPLANT
DRAPE CV SPLIT W-CLR ANES SCRN (DRAPES) ×3 IMPLANT
DRAPE DA VINCI XI ARM (DISPOSABLE) ×12
DRAPE DA VINCI XI COLUMN (DISPOSABLE) ×3
DRAPE HALF SHEET 40X57 (DRAPES) ×1 IMPLANT
DRAPE INCISE IOBAN 66X45 STRL (DRAPES) IMPLANT
DRAPE ORTHO SPLIT 77X108 STRL (DRAPES) ×3
DRAPE SURG ORHT 6 SPLT 77X108 (DRAPES) ×2 IMPLANT
ELECT REM PT RETURN 9FT ADLT (ELECTROSURGICAL) ×3
ELECTRODE REM PT RTRN 9FT ADLT (ELECTROSURGICAL) ×2 IMPLANT
FELT TEFLON 1X6 (MISCELLANEOUS) IMPLANT
FORCEPS GRASP COMBO 8X230 (FORCEP) ×3 IMPLANT
GAUZE SPONGE 4X4 12PLY STRL (GAUZE/BANDAGES/DRESSINGS) ×3 IMPLANT
GLOVE SURG ENC MOIS LTX SZ7 (GLOVE) ×3 IMPLANT
GLOVE SURG ENC MOIS LTX SZ7.5 (GLOVE) ×6 IMPLANT
GOWN STRL REUS W/ TWL LRG LVL3 (GOWN DISPOSABLE) ×2 IMPLANT
GOWN STRL REUS W/ TWL XL LVL3 (GOWN DISPOSABLE) ×4 IMPLANT
GOWN STRL REUS W/TWL 2XL LVL3 (GOWN DISPOSABLE) ×3 IMPLANT
GOWN STRL REUS W/TWL LRG LVL3 (GOWN DISPOSABLE) ×3
GOWN STRL REUS W/TWL XL LVL3 (GOWN DISPOSABLE) ×6
GRAFT MYRIAD 5 LAYER 5X5 (Graft) ×1 IMPLANT
GRASPER SUT TROCAR 14GX15 (MISCELLANEOUS) IMPLANT
HEMOSTAT SURGICEL 2X14 (HEMOSTASIS) IMPLANT
IV NS 1000ML (IV SOLUTION)
IV NS 1000ML BAXH (IV SOLUTION) IMPLANT
KIT BASIN OR (CUSTOM PROCEDURE TRAY) ×3 IMPLANT
KIT TURNOVER KIT B (KITS) ×3 IMPLANT
MARKER SKIN DUAL TIP RULER LAB (MISCELLANEOUS) ×3 IMPLANT
NDL 18GX1X1/2 (RX/OR ONLY) (NEEDLE) IMPLANT
NDL SCLEROTHERAPY 23X2X240 (NEEDLE) IMPLANT
NEEDLE 18GX1X1/2 (RX/OR ONLY) (NEEDLE) IMPLANT
NEEDLE HYPO 22GX1.5 SAFETY (NEEDLE) ×3 IMPLANT
NEEDLE SCLEROTHERAPY 23X2X240 (NEEDLE) IMPLANT
NS IRRIG 1000ML POUR BTL (IV SOLUTION) ×6 IMPLANT
OBTURATOR OPTICAL STANDARD 8MM (TROCAR) ×3
OBTURATOR OPTICAL STND 8 DVNC (TROCAR) ×2
OBTURATOR OPTICALSTD 8 DVNC (TROCAR) IMPLANT
OIL SILICONE PENTAX (PARTS (SERVICE/REPAIRS)) IMPLANT
PACK CHEST (CUSTOM PROCEDURE TRAY) ×3 IMPLANT
PACK UNIVERSAL I (CUSTOM PROCEDURE TRAY) ×3 IMPLANT
PAD ARMBOARD 7.5X6 YLW CONV (MISCELLANEOUS) ×6 IMPLANT
PORT ACCESS TROCAR AIRSEAL 12 (TROCAR) ×2 IMPLANT
PORT ACCESS TROCAR AIRSEAL 5M (TROCAR) ×1
SEAL CANN UNIV 5-8 DVNC XI (MISCELLANEOUS) ×8 IMPLANT
SEAL XI 5MM-8MM UNIVERSAL (MISCELLANEOUS) ×12
SEALER SYNCHRO 8 IS4000 DV (MISCELLANEOUS) ×3
SEALER SYNCHRO 8 IS4000 DVNC (MISCELLANEOUS) IMPLANT
SET TRI-LUMEN FLTR TB AIRSEAL (TUBING) ×3 IMPLANT
SHEET MEDIUM DRAPE 40X70 STRL (DRAPES) ×3 IMPLANT
STAPLER CANNULA SEAL DVNC XI (STAPLE) IMPLANT
STAPLER CANNULA SEAL XI (STAPLE)
SUT ETHIBOND 0 36 GRN (SUTURE) ×6 IMPLANT
SUT SILK  1 MH (SUTURE)
SUT SILK 1 MH (SUTURE) ×2 IMPLANT
SUT SURGIDAC NAB ES-9 0 48 120 (SUTURE) IMPLANT
SUT VIC AB 3-0 SH 27 (SUTURE) ×12
SUT VIC AB 3-0 SH 27X BRD (SUTURE) ×4 IMPLANT
SUT VIC AB 3-0 X1 27 (SUTURE) ×4 IMPLANT
SUT VICRYL 0 UR6 27IN ABS (SUTURE) ×6 IMPLANT
SYR 10ML LL (SYRINGE) IMPLANT
SYR 20CC LL (SYRINGE) ×6 IMPLANT
SYR 20ML ECCENTRIC (SYRINGE) ×3 IMPLANT
SYR 30ML SLIP (SYRINGE) ×3 IMPLANT
SYSTEM RETRIEVAL ANCHOR 8 (MISCELLANEOUS) ×1 IMPLANT
SYSTEM SAHARA CHEST DRAIN ATS (WOUND CARE) IMPLANT
TOWEL GREEN STERILE (TOWEL DISPOSABLE) ×3 IMPLANT
TOWEL GREEN STERILE FF (TOWEL DISPOSABLE) ×3 IMPLANT
TRAY FOLEY MTR SLVR 16FR STAT (SET/KITS/TRAYS/PACK) ×3 IMPLANT
TROCAR XCEL BLADELESS 5X75MML (TROCAR) ×1 IMPLANT
TROCAR XCEL NON-BLD 5MMX100MML (ENDOMECHANICALS) IMPLANT
TUBE CONNECTING 20X1/4 (TUBING) ×3 IMPLANT
TUBING ENDO SMARTCAP (MISCELLANEOUS) ×3 IMPLANT
UNDERPAD 30X36 HEAVY ABSORB (UNDERPADS AND DIAPERS) ×3 IMPLANT
WATER STERILE IRR 1000ML POUR (IV SOLUTION) ×4 IMPLANT

## 2020-12-17 NOTE — Op Note (Signed)
[address]       [place] [address]             [phone]        12/17/2020  Patient:  [name] Pre-Op Dx: Paraesophageal hernia GERD Obesity   Post-op Dx:  same Procedure: - Esophagoscopy - Robotic assisted laparoscopy - Paraesophageal hernia repair with Myriad mesh pledgets - Dor Fundoplication   Surgeon and Role:      * Sandia Pfund, Lucile Crater, MD - Primary    * E. Barrett, PA-C - assisting  Anesthesia  general EBL:  45m Blood Administration: none Specimen:  hiatal hernia sac   Counts: correct   Indications: 60year old female with a large hiatal hernia.  She also has significant reflux, and dysphagia.  Of concern is the fact that she must induce vomiting to treat her symptoms.  Cross-sectional imaging was from over 2 years ago, thus I will repeat his CT scan.  We will also take a look at this 6 mm pulmonary nodule that was noted.  We discussed the risks and benefits of upper endoscopy, robotic assisted laparoscopy with hiatal hernia repair, and fundoplication.  Her BMI is 39 thus I will use an absorbable mesh.  She is agree to proceed and is tentatively scheduled for August 8.  Findings: Large hiatal defect.  Stomach in the hernia sac.  4 stitches required to re-approximate the hernia  Operative Technique: After the risks, benefits and alternatives were thoroughly discussed, the patient was brought to the operative theatre.  Anesthesia was induced, and the esophagoscope was passed through the oropharynx down to the stomach.  The scope was retroflexed and the hiatal hernia was clearly evident.  The scope was pulled back and the mucosal surface of the esophagus was visualized.    The esophagus was tortuous and patulous.  The scope was then parked at 25 cm from the incisors.  The patient was then prepped and draped in normal sterile fashion.  An appropriate surgical pause was performed, and pre-operative antibiotics were dosed accordingly.  We  began with a 1 cm incision 15 cm caudad from the xiphoid and slightly lateral to the umbilicus.  Using an Optiview we entered the peritoneal space.  The abdomen was then insufflated with CO2.  3 other robotic ports were placed to triangulate the hiatus.  Another 12 mm port was placed in place at the level of the umbilicus laterally for an assistant port and another 5 mm trocar was placed in the right lower quadrant for liver retractor.  The patient was then placed in steep reverse Trendelenburg and the liver was elevated to expose the esophageal hiatus.  And then the robot was docked.  We began by dividing the gastrohepatic ligament to expose the right diaphragmatic crus and then dissected the hernia sac in a clockwise fashion to mobilize there the stomach and esophagus.  We then divided the short gastrics and moved towards the right crus and completed our dissection along the esophageal hiatus.  A Penrose drain was then used to encircle the the esophagus and we continued our dissection up into the mediastinum.  Once we had achieved 3 to 4 cm of intra-abdominal esophagus we then proceeded to reapproximate the crura with 0 Ethibond sutures in an interrupted fashion.  Myriad mesh pledgets were used.  The gastroscope was passed down through the lower esophageal sphincter into the stomach and would act as our bougie during this repair.  Next the stomach  was passed anterior to the esophagus and a Dor fundoplication was performed.  An air leak test was performed using the gastroscope.  No leak was evident.  The liver retractor was removed and all ports were removed under direct visualization.  The skin and soft tissue were closed with absorbable suture    The patient tolerated the procedure without any immediate complications, and was transferred to the PACU in stable condition.  Porter Nakama [name]

## 2020-12-17 NOTE — Interval H&P Note (Signed)
History and Physical Interval Note:  12/17/2020 10:46 AM  [name]  has presented today for surgery, with the diagnosis of PEH.  The various methods of treatment have been discussed with the patient and family. After consideration of risks, benefits and other options for treatment, the patient has consented to  Procedure(s): XI ROBOTIC ASSISTED Laparoscopy PARAESOPHAGEAL HERNIA REPAIR WITH FUNDOPLICATION (N/A) ESOPHAGOGASTRODUODENOSCOPY (EGD) (N/A) as a surgical intervention.  The patient's history has been reviewed, patient examined, no change in status, stable for surgery.  I have reviewed the patient's chart and labs.  Questions were answered to the patient's satisfaction.     [name] [name]

## 2020-12-17 NOTE — Anesthesia Preprocedure Evaluation (Signed)
Anesthesia Evaluation  Patient identified by MRN, date of birth, ID band Patient awake    Reviewed: Allergy & Precautions, NPO status , Patient's Chart, lab work & pertinent test results  Airway Mallampati: II  TM Distance: >3 FB     Dental   Pulmonary shortness of breath,    breath sounds clear to auscultation       Cardiovascular hypertension,  Rhythm:Regular Rate:Normal     Neuro/Psych  Neuromuscular disease    GI/Hepatic hiatal hernia, GERD  ,  Endo/Other    Renal/GU Renal disease     Musculoskeletal  (+) Arthritis , Fibromyalgia -  Abdominal   Peds  Hematology   Anesthesia Other Findings   Reproductive/Obstetrics                             Anesthesia Physical Anesthesia Plan  ASA: 3  Anesthesia Plan: General   Post-op Pain Management:    Induction:   PONV Risk Score and Plan: 3 and Ondansetron, Dexamethasone and Midazolam  Airway Management Planned: Oral ETT  Additional Equipment:   Intra-op Plan:   Post-operative Plan: Possible Post-op intubation/ventilation  Informed Consent: I have reviewed the patients History and Physical, chart, labs and discussed the procedure including the risks, benefits and alternatives for the proposed anesthesia with the patient or authorized representative who has indicated his/her understanding and acceptance.     Dental advisory given  Plan Discussed with: CRNA, Anesthesiologist and Surgeon  Anesthesia Plan Comments:         Anesthesia Quick Evaluation

## 2020-12-17 NOTE — Hospital Course (Addendum)
History of Present Illness:  [name] 60 y.o. female referred for surgical evaluation of a large paraesophageal hernia.  She has had significant symptoms for over 10 years.  She was previously scheduled to undergo repair at [facility] but this was canceled due to lack of insurance.  She currently has reflux on a daily basis and is unable to go without her omeprazole.  She also complains of significant dysphagia, and nausea.  After most meals she often has to force herself to throw up due to pain.  She is only able to eat small meals.  She also complains of dyspnea on exertion.    She was recently prescribed an inhaler but this also does not improve her shortness of breath.  She denies any weight loss.  She was evaluated by Dr. Kipp Brood who felt patient would benefit from surgical intervention.  The risks and benefits of the procedure were explained to the patient and she was agreeable to proceed.  Hospital Course:  [name] presented to [facility] on 12/17/2020.  She was taken to the operating room and underwent Repair of Paraesophageal hernia with fundoplication.  She tolerated the procedure without difficulty, was extubated, and taken to the PACU in stable condition.  The patient was treated with scheduled zofran to avoid nausea, vomiting.  She was kept NPO overnight and received IV Fluids.  She underwent Barium Esophogram on POD #1 which showed no evidence of leak.  She was started on a liquid diet which she tolerated without difficulty.  She will be progressed to a soft dysphagia 1 diet for several weeks.  Her pain is controlled.  She denies N/V.  Her surgical incisions are healing without evidence of infection.  She is medically stable for discharge home today.

## 2020-12-17 NOTE — Anesthesia Procedure Notes (Addendum)
Procedure Name: Intubation Date/Time: 12/17/2020 11:18 AM Performed by: Georgia Duff, CRNA Pre-anesthesia Checklist: Patient identified, Emergency Drugs available, Suction available and Patient being monitored Patient Re-evaluated:Patient Re-evaluated prior to induction Oxygen Delivery Method: Circle System Utilized Preoxygenation: Pre-oxygenation with 100% oxygen Induction Type: IV induction Ventilation: Mask ventilation without difficulty Laryngoscope Size: Miller and 2 Grade View: Grade I Tube type: Oral Number of attempts: 1 Airway Equipment and Method: Stylet and Oral airway Placement Confirmation: ETT inserted through vocal cords under direct vision, positive ETCO2 and breath sounds checked- equal and bilateral Secured at: 20 cm Tube secured with: Tape Dental Injury: Teeth and Oropharynx as per pre-operative assessment

## 2020-12-17 NOTE — Telephone Encounter (Signed)
Patient states she needs bloodwork done but she is finally having the surgery done on her stomach done today and the surgeon did bloodwork on her Friday at the hospital and patient thinks the bloodwork was done then. Please advise, thanks. Patient will be in the hospital till midweek.

## 2020-12-17 NOTE — Anesthesia Postprocedure Evaluation (Signed)
Anesthesia Post Note  Patient: [name]  Procedure(s) Performed: XI ROBOTIC ASSISTED Laparoscopy PARAESOPHAGEAL HERNIA REPAIR WITH FUNDOPLICATION (Abdomen) ESOPHAGOGASTRODUODENOSCOPY (EGD)     Patient location during evaluation: PACU Anesthesia Type: General Level of consciousness: awake Pain management: pain level controlled Vital Signs Assessment: post-procedure vital signs reviewed and stable Respiratory status: spontaneous breathing Cardiovascular status: stable Postop Assessment: no apparent nausea or vomiting Anesthetic complications: no   No notable events documented.  Last Vitals:  Vitals:   12/17/20 1555 12/17/20 1610  BP: 123/75 123/71  Pulse: (!) 59 60  Resp: 18 16  Temp:    SpO2: 99% 95%    Last Pain:  Vitals:   12/17/20 1610  TempSrc:   PainSc: 7                  Arrabella Westerman

## 2020-12-17 NOTE — Transfer of Care (Signed)
Immediate Anesthesia Transfer of Care Note  Patient: [name]  Procedure(s) Performed: XI ROBOTIC ASSISTED Laparoscopy PARAESOPHAGEAL HERNIA REPAIR WITH FUNDOPLICATION (Abdomen) ESOPHAGOGASTRODUODENOSCOPY (EGD)  Patient Location: PACU  Anesthesia Type:General  Level of Consciousness: awake, alert  and patient cooperative  Airway & Oxygen Therapy: Patient Spontanous Breathing and Patient connected to face mask oxygen  Post-op Assessment: Report given to RN and Post -op Vital signs reviewed and stable  Post vital signs: Reviewed and stable  Last Vitals:  Vitals Value Taken Time  BP    Temp    Pulse    Resp    SpO2      Last Pain:  Vitals:   12/17/20 0940  TempSrc:   PainSc: 0-No pain         Complications: No notable events documented.

## 2020-12-17 NOTE — Anesthesia Postprocedure Evaluation (Signed)
Anesthesia Post Note  Patient: [name]  Procedure(s) Performed: XI ROBOTIC ASSISTED Laparoscopy PARAESOPHAGEAL HERNIA REPAIR WITH FUNDOPLICATION (Abdomen) ESOPHAGOGASTRODUODENOSCOPY (EGD)     Patient location during evaluation: PACU Anesthesia Type: General Level of consciousness: awake Pain management: pain level controlled Vital Signs Assessment: post-procedure vital signs reviewed and stable Respiratory status: spontaneous breathing Cardiovascular status: stable Postop Assessment: no apparent nausea or vomiting Anesthetic complications: no   No notable events documented.  Last Vitals:  Vitals:   12/17/20 1525 12/17/20 1540  BP: (!) 148/89 131/77  Pulse: 74 60  Resp: 19 19  Temp:    SpO2: 100% 99%    Last Pain:  Vitals:   12/17/20 1540  TempSrc:   PainSc: 9                  Nekesha Font

## 2020-12-18 ENCOUNTER — Observation Stay (HOSPITAL_COMMUNITY): Payer: Medicare Other

## 2020-12-18 ENCOUNTER — Encounter (HOSPITAL_COMMUNITY): Payer: Self-pay | Admitting: Thoracic Surgery (Cardiothoracic Vascular Surgery)

## 2020-12-18 LAB — BASIC METABOLIC PANEL
Anion gap: 10 (ref 5–15)
BUN: 20 mg/dL (ref 6–20)
CO2: 26 mmol/L (ref 22–32)
Calcium: 9.1 mg/dL (ref 8.9–10.3)
Chloride: 100 mmol/L (ref 98–111)
Creatinine, Ser: 1.43 mg/dL — ABNORMAL HIGH (ref 0.44–1.00)
GFR, Estimated: 43 mL/min — ABNORMAL LOW (ref >60)
Glucose, Bld: 145 mg/dL — ABNORMAL HIGH (ref 70–99)
Potassium: 4 mmol/L (ref 3.5–5.1)
Sodium: 136 mmol/L (ref 135–145)

## 2020-12-18 LAB — CBC
HCT: 32.8 % — ABNORMAL LOW (ref 36.0–46.0)
Hemoglobin: 10.6 g/dL — ABNORMAL LOW (ref 12.0–15.0)
MCH: 27.7 pg (ref 26.0–34.0)
MCHC: 32.3 g/dL (ref 30.0–36.0)
MCV: 85.6 fL (ref 80.0–100.0)
Platelets: 400 10*3/uL (ref 150–400)
RBC: 3.83 MIL/uL — ABNORMAL LOW (ref 3.87–5.11)
RDW: 16.9 % — ABNORMAL HIGH (ref 11.5–15.5)
WBC: 13.3 10*3/uL — ABNORMAL HIGH (ref 4.0–10.5)
nRBC: 0 % (ref 0.0–0.2)

## 2020-12-18 LAB — SURGICAL PATHOLOGY

## 2020-12-18 MED ORDER — DIATRIZOATE MEGLUMINE & SODIUM 66-10 % PO SOLN
120.0000 mL | Freq: Once | ORAL | Status: AC
  Administered 2020-12-18: 100 mL via ORAL
  Filled 2020-12-18: qty 120

## 2020-12-18 NOTE — Progress Notes (Signed)
Patient has a foley in and has no order to keep in or remove it. Patient also has no order to advance the diet and is still NPO. Patient asking for something to eat. Called on-call MD and made MD aware of the situation. Per MD, to keep the patient NPO and the foley in till morning.

## 2020-12-18 NOTE — Discharge Summary (Signed)
Physician Discharge Summary  Patient ID: [name] MRN: [id number] DOB/AGE: 1960-10-07 60 y.o.  Admit date: 12/17/2020 Discharge date: 12/19/2020  Admission Diagnoses: Patient Active Problem List   Diagnosis Date Noted   Anxiety 11/02/2019   B12 deficiency 11/02/2019   Adjustment disorder with mixed anxiety and depressed mood 08/22/2019   Iron deficiency anemia 08/22/2019   Gastroesophageal reflux disease 08/22/2019   Chronic joint pain 08/22/2019   Stage 3 chronic kidney disease ([place]) 08/22/2019   Obesity (BMI 35.0-39.9 without comorbidity) 02/28/2019   Normocytic anemia, not due to blood loss 02/28/2019   Restless legs syndrome (RLS) 06/30/2018   Arthrosis of first carpometacarpal joint 02/11/2018   Primary osteoarthritis of first carpometacarpal joint of left hand    Pain in left hand 12/23/2017   Healthcare maintenance 11/16/2017   Tinea versicolor 11/16/2017   Chronic right shoulder pain 09/02/2017   Fibromyalgia 08/11/2017   Family history of diabetes mellitus 06/23/2016   Other fatigue 06/23/2016   Insomnia 06/23/2016   Rheumatoid arthritis ([place]) 06/23/2016   Status post right partial knee replacement 09/15/2014   Status post total knee replacement using cement 06/08/2014   Screening for colon cancer    Schatzki's ring    Hiatal hernia    Dysphagia 05/26/2014   Encounter for screening colonoscopy 05/26/2014   Adrenal adenoma 05/01/2014   Breast lump 05/01/2014   Metatarsalgia of both feet 01/12/2014   Bilateral leg pain 01/11/2014   Bilateral edema of lower extremity 01/11/2014   Other osteoarthritis of spine, thoracolumbar region 12/26/2013   Hypercholesteremia 05/19/2013   Periodic health assessment, general screening, adult 04/13/2013   GERD (gastroesophageal reflux disease) 04/13/2013   Essential hypertension 04/13/2013   Dyspnea 04/13/2013     Discharge Diagnoses:  Active Problems:   S/P repair of paraesophageal hernia   Discharged Condition:  good  History of Present Illness:  [name] 60 y.o. female referred for surgical evaluation of a large paraesophageal hernia.  She has had significant symptoms for over 10 years.  She was previously scheduled to undergo repair at [facility] but this was canceled due to lack of insurance.  She currently has reflux on a daily basis and is unable to go without her omeprazole.  She also complains of significant dysphagia, and nausea.  After most meals she often has to force herself to throw up due to pain.  She is only able to eat small meals.  She also complains of dyspnea on exertion.    She was recently prescribed an inhaler but this also does not improve her shortness of breath.  She denies any weight loss.  She was evaluated by Dr. Kipp Brood who felt patient would benefit from surgical intervention.  The risks and benefits of the procedure were explained to the patient and she was agreeable to proceed.  Hospital Course:  [name] presented to [facility] on 12/17/2020.  She was taken to the operating room and underwent Repair of Paraesophageal hernia with fundoplication.  She tolerated the procedure without difficulty, was extubated, and taken to the PACU in stable condition.  The patient was treated with scheduled zofran to avoid nausea, vomiting.  She was kept NPO overnight and received IV Fluids.  She underwent Barium Esophogram on POD #1 which showed no evidence of leak.  She was started on a liquid diet which she tolerated without difficulty.  She will be progressed to a soft dysphagia 1 diet for several weeks.  Her pain is controlled.  She  denies N/V.  Her surgical incisions are healing without evidence of infection.  She is medically stable for discharge home today.  Significant Diagnostic Studies: esophogram  Treatments: surgery:   12/17/2020   Patient:  [name] Pre-Op Dx: Paraesophageal hernia GERD Obesity   Post-op Dx:  same Procedure: - Esophagoscopy - Robotic assisted  laparoscopy - Paraesophageal hernia repair with Myriad mesh pledgets - Dor Fundoplication     Surgeon and Role:      * Lightfoot, Lucile Crater, MD - Primary    * E. Barrett, PA-C - assisting    Discharge Exam: Blood pressure (!) 149/76, pulse 78, temperature 98.7 F (37.1 C), temperature source Oral, resp. rate 18, height _0  (1.6 m), weight 99 kg, last menstrual period 02/07/2013, SpO2 95 %.   General appearance: alert, cooperative, and no distress Heart: regular rate and rhythm, S1, S2 normal, no murmur, click, rub or gallop Lungs: clear to auscultation bilaterally Abdomen: soft, non-tender; bowel sounds normal; no masses,  no organomegaly Extremities: extremities normal, atraumatic, no cyanosis or edema Wound: clean and dry  Disposition: Discharge disposition: 01-Home or Self Care      Allergies as of 12/19/2020       Reactions   Tramadol Itching   "bugs crawling all over"        Medication List     STOP taking these medications    metoprolol succinate 25 MG 24 hr tablet Commonly known as: TOPROL-XL       TAKE these medications    albuterol 108 (90 Base) MCG/ACT inhaler Commonly known as: VENTOLIN HFA Inhale 2 puffs into the lungs every 6 (six) hours as needed for wheezing or shortness of breath.   ciprofloxacin 500 MG tablet Commonly known as: Cipro Take 1 tablet (500 mg total) by mouth 2 (two) times daily.   FLUoxetine 20 MG tablet Commonly known as: PROZAC Take 1.5 tablets (30 mg total) by mouth daily. Take 1.5 tablet by mouth daily.   hydrochlorothiazide 50 MG tablet Commonly known as: HYDRODIURIL Take 1 tablet by mouth once daily   lisinopril 40 MG tablet Commonly known as: ZESTRIL Take 1 tablet by mouth once daily   metoprolol tartrate 25 MG tablet Commonly known as: LOPRESSOR Take 0.5 tablets (12.5 mg total) by mouth 2 (two) times daily.   omeprazole 20 MG capsule Commonly known as: PRILOSEC Take 1 capsule by mouth once daily    ondansetron 4 MG tablet Commonly known as: Zofran Take 1 tablet (4 mg total) by mouth every 6 (six) hours for 15 days.   oxyCODONE 5 MG immediate release tablet Commonly known as: Roxicodone Take 1 tablet (5 mg total) by mouth every 6 (six) hours as needed for severe pain.               Durable Medical Equipment  (From admission, onward)           Start     Ordered   12/19/20 0831  For home use only DME oxygen  Once       Question Answer Comment  Length of Need 6 Months   Mode or (Route) Nasal cannula   Liters per Minute 2   Frequency Continuous (stationary and portable oxygen unit needed)   Oxygen conserving device Yes   Oxygen delivery system Gas      12/19/20 0830            Follow-up Information     Lorrene Reid, PA-C. Call today.   Specialty:  Physician Assistant Contact information: [place] [address] [phone]         Lajuana Matte, MD Follow up.   Specialty: Cardiothoracic Surgery Why: Your VIRTUAL appointment is on 8/19 at 3:10pm. Please do no come to the office for this appointment. Contact information: [address] [place] [address] [phone]         [facility] Follow up.   Why: home oxygen Contact information: [place] [address] [phone]                 Signed: Elgie Collard, PA-C  12/19/2020, 1:02 PM

## 2020-12-18 NOTE — Progress Notes (Addendum)
      [address]       [place] [address]             [phone]      1 Day Post-Op Procedure(s) (LRB): XI ROBOTIC ASSISTED Laparoscopy PARAESOPHAGEAL HERNIA REPAIR WITH FUNDOPLICATION (N/A) ESOPHAGOGASTRODUODENOSCOPY (EGD) (N/A)  Subjective:  Patient has some discomfort in her chest. Denies nausea, vomiting  Objective: Vital signs in last 24 hours: Temp:  [97.6 F (36.4 C)-98.5 F (36.9 C)] 98.3 F (36.8 C) (08/09 0300) Pulse Rate:  [59-80] 67 (08/09 0300) Cardiac Rhythm: Normal sinus rhythm (08/09 0707) Resp:  [11-22] 13 (08/09 0300) BP: (114-148)/(59-89) 129/68 (08/09 0300) SpO2:  [95 %-100 %] 97 % (08/09 0300) Weight:  [99 kg] 99 kg (08/08 0927)  Intake/Output from previous day: 08/08 0701 - 08/09 0700 In: 2054.3 [I.V.:1954.3; IV Piggyback:100] Out: 330 [Urine:325; Blood:5]  General appearance: alert, cooperative, and no distress Heart: regular rate and rhythm Lungs: clear to auscultation bilaterally Abdomen: soft, non-tender; bowel sounds normal; no masses,  no organomegaly Wound: clean and dry  Lab Results: Recent Labs    12/17/20 1941 12/18/20 0029  WBC 16.0* 13.3*  HGB 11.1* 10.6*  HCT 34.8* 32.8*  PLT 431* 400   BMET:  Recent Labs    12/17/20 1941 12/18/20 0029  NA  --  136  K  --  4.0  CL  --  100  CO2  --  26  GLUCOSE  --  145*  BUN  --  20  CREATININE 1.47* 1.43*  CALCIUM  --  9.1    PT/INR: No results for input(s): LABPROT, INR in the last 72 hours. ABG    Component Value Date/Time   PHART 7.445 12/14/2020 1018   HCO3 24.9 12/14/2020 1018   TCO2 25 01/27/2018 0814   O2SAT 96.8 12/14/2020 1018   CBG (last 3)  No results for input(s): GLUCAP in the last 72 hours.  Assessment/Plan: S/P Procedure(s) (LRB): XI ROBOTIC ASSISTED Laparoscopy PARAESOPHAGEAL HERNIA REPAIR WITH FUNDOPLICATION (N/A) ESOPHAGOGASTRODUODENOSCOPY (EGD) (N/A)  CV- NSR, BP ranging 110s-140s- continue prn Lopressor for now, can resume oral  agents once she is cleared for oral intake Pulm- no acute issues Renal- creatinine remains stable at baseline, d/c foley catheter, stop IV fluids once cleared for oral intake GI- remains NPO, for barium esophogram this morning, if normal will start liquid diet today Dispo- patient stable, denies N/V, for esophogram this morning if normal will start diet, if tolerates without issue will plan to d/c home later today   LOS: 1 day   Ellwood Handler, PA-C 12/18/2020   Swallow today clear Advancing diet Pain control  Kiyah Demartini O Sai Moura

## 2020-12-18 NOTE — Discharge Instructions (Signed)
Discharge Instructions:  You may shower.  Please wash incisions daily with soap and water, keep dry.  Do not swim or tub bathe until incisions are healed Activity- up as tolerated, please walk at least 3 times per day CRUSH ALL MEDS IN APPLESAUCE, until diet is advanced DIET- soft foods, dysphagia --eat small amounts  Contact office if you develop problems or have questions/concerns at [phone]

## 2020-12-19 DIAGNOSIS — R1310 Dysphagia, unspecified: Secondary | ICD-10-CM | POA: Diagnosis present

## 2020-12-19 DIAGNOSIS — M069 Rheumatoid arthritis, unspecified: Secondary | ICD-10-CM | POA: Diagnosis present

## 2020-12-19 DIAGNOSIS — Z96653 Presence of artificial knee joint, bilateral: Secondary | ICD-10-CM | POA: Diagnosis present

## 2020-12-19 DIAGNOSIS — Z886 Allergy status to analgesic agent status: Secondary | ICD-10-CM | POA: Diagnosis not present

## 2020-12-19 DIAGNOSIS — N183 Chronic kidney disease, stage 3 unspecified: Secondary | ICD-10-CM | POA: Diagnosis present

## 2020-12-19 DIAGNOSIS — K219 Gastro-esophageal reflux disease without esophagitis: Secondary | ICD-10-CM | POA: Diagnosis present

## 2020-12-19 DIAGNOSIS — G4700 Insomnia, unspecified: Secondary | ICD-10-CM | POA: Diagnosis present

## 2020-12-19 DIAGNOSIS — K449 Diaphragmatic hernia without obstruction or gangrene: Secondary | ICD-10-CM | POA: Diagnosis present

## 2020-12-19 DIAGNOSIS — Z79899 Other long term (current) drug therapy: Secondary | ICD-10-CM | POA: Diagnosis not present

## 2020-12-19 DIAGNOSIS — E669 Obesity, unspecified: Secondary | ICD-10-CM | POA: Diagnosis present

## 2020-12-19 DIAGNOSIS — Z86718 Personal history of other venous thrombosis and embolism: Secondary | ICD-10-CM | POA: Diagnosis not present

## 2020-12-19 DIAGNOSIS — G2581 Restless legs syndrome: Secondary | ICD-10-CM | POA: Diagnosis present

## 2020-12-19 DIAGNOSIS — R911 Solitary pulmonary nodule: Secondary | ICD-10-CM | POA: Diagnosis present

## 2020-12-19 DIAGNOSIS — M797 Fibromyalgia: Secondary | ICD-10-CM | POA: Diagnosis present

## 2020-12-19 DIAGNOSIS — Z6839 Body mass index (BMI) 39.0-39.9, adult: Secondary | ICD-10-CM | POA: Diagnosis not present

## 2020-12-19 DIAGNOSIS — E7800 Pure hypercholesterolemia, unspecified: Secondary | ICD-10-CM | POA: Diagnosis present

## 2020-12-19 DIAGNOSIS — I10 Essential (primary) hypertension: Secondary | ICD-10-CM | POA: Diagnosis present

## 2020-12-19 DIAGNOSIS — F32A Depression, unspecified: Secondary | ICD-10-CM | POA: Diagnosis present

## 2020-12-19 MED ORDER — METOPROLOL TARTRATE 25 MG PO TABS: 12.5000 mg | ORAL_TABLET | Freq: Two times a day (BID) | ORAL | 1 refills | Status: DC

## 2020-12-19 MED ORDER — ONDANSETRON HCL 4 MG PO TABS: 4.0000 mg | ORAL_TABLET | Freq: Four times a day (QID) | ORAL | 1 refills | Status: AC

## 2020-12-19 MED ORDER — OXYCODONE HCL 5 MG PO TABS: 5.0000 mg | ORAL_TABLET | Freq: Four times a day (QID) | ORAL | 0 refills | Status: DC | PRN

## 2020-12-19 NOTE — Plan of Care (Signed)

## 2020-12-19 NOTE — TOC Transition Note (Signed)
Transition of Care [facility]) - CM/SW Discharge Note   Patient Details  Name: [name] MRN: [id number] Date of Birth: Apr 16, 1961  Transition of Care [facility]) CM/SW Contact:  Zenon Mayo, RN Phone Number: 12/19/2020, 1:37 PM   Clinical Narrative:    NCM spoke with patient, she does not have home oxygen, she is ok with [employer] supplying the home oxygen for her.  NCM made referral to [facility] with [employer].  The oxygen tanks will be brought to patient room and the concentrator will be set up at the patient 's home.     Final next level of care: Home/Self Care Barriers to Discharge: No Barriers Identified   Patient Goals and CMS Choice Patient states their goals for this hospitalization and ongoing recovery are:: return home   Choice offered to / list presented to : NA  Discharge Placement                       Discharge Plan and Services                DME Arranged: Oxygen DME Agency: [employer] Date DME Agency Contacted: 12/19/20 Time DME Agency Contacted: 1120 Representative spoke with at DME Agency: [place]: NA          Social Determinants of Health ([place]) Interventions     Readmission Risk Interventions No flowsheet data found.

## 2020-12-19 NOTE — Progress Notes (Signed)
      [name]       [name] [address]             [phone]      2 Days Post-Op Procedure(s) (LRB): XI ROBOTIC ASSISTED Laparoscopy PARAESOPHAGEAL HERNIA REPAIR WITH FUNDOPLICATION (N/A) ESOPHAGOGASTRODUODENOSCOPY (EGD) (N/A) Subjective: Feels okay this morning, no complaints. She does have some tenderness with swallowing but no difficulty.   Objective: Vital signs in last 24 hours: Temp:  [98 F (36.7 C)-98.7 F (37.1 C)] 98.7 F (37.1 C) (08/10 0726) Pulse Rate:  [70-85] 76 (08/10 0726) Cardiac Rhythm: Normal sinus rhythm (08/09 1914) Resp:  [17-20] 18 (08/10 0726) BP: (115-137)/(68-84) 137/84 (08/10 0726) SpO2:  [93 %-98 %] 93 % (08/10 0726)    Intake/Output from previous day: 08/09 0701 - 08/10 0700 In: 789.6 [P.O.:240; I.V.:549.6] Out: -  Intake/Output this shift: No intake/output data recorded.  General appearance: alert, cooperative, and no distress Heart: regular rate and rhythm, S1, S2 normal, no murmur, click, rub or gallop Lungs: clear to auscultation bilaterally Abdomen: soft, non-tender; bowel sounds normal; no masses,  no organomegaly Extremities: extremities normal, atraumatic, no cyanosis or edema Wound: clean and dry  Lab Results: Recent Labs    12/17/20 1941 12/18/20 0029  WBC 16.0* 13.3*  HGB 11.1* 10.6*  HCT 34.8* 32.8*  PLT 431* 400   BMET:  Recent Labs    12/17/20 1941 12/18/20 0029  NA  --  136  K  --  4.0  CL  --  100  CO2  --  26  GLUCOSE  --  145*  BUN  --  20  CREATININE 1.47* 1.43*  CALCIUM  --  9.1    PT/INR: No results for input(s): LABPROT, INR in the last 72 hours. ABG    Component Value Date/Time   PHART 7.445 12/14/2020 1018   HCO3 24.9 12/14/2020 1018   TCO2 25 01/27/2018 0814   O2SAT 96.8 12/14/2020 1018   CBG (last 3)  No results for input(s): GLUCAP in the last 72 hours.  Assessment/Plan: S/P Procedure(s) (LRB): XI ROBOTIC ASSISTED Laparoscopy PARAESOPHAGEAL HERNIA REPAIR WITH  FUNDOPLICATION (N/A) ESOPHAGOGASTRODUODENOSCOPY (EGD) (N/A)  CV- NSR, BP ranging 110s-140s- continue prn Lopressor for now, we will resume oral agents on d/c Pulm- Needs oxygen at night but was no on it during the day. Will order a 6 minute walk test to evaluate need for home oxygen. Renal- creatinine remains stable at baseline, d/c foley catheter, stop IV fluids once cleared for oral intake GI- remains on Dysphagia 1 and tolerating. Will plan to go home on baby food consistency diet.   Plan: 6 minute walk to determine need for home oxygen. Tolerating Dys 1 and encouraged to try baby food consistency food at home. She has an appointment in 1 week to see Dr. Kipp Brood.    LOS: 1 day    Elgie Collard 12/19/2020

## 2020-12-19 NOTE — TOC Progression Note (Signed)
Transition of Care [facility]) - Progression Note    Patient Details  Name: [name] MRN: [id number] Date of Birth: 09-12-1960  Transition of Care [facility]) CM/SW Contact  Zenon Mayo, RN Phone Number: 12/19/2020, 9:49 AM  Clinical Narrative:    NCM spoke with patient, she does not have home oxygen, she is ok with [employer] supplying the home oxygen for her.  NCM made referral to [facility] with [employer].  The oxygen tanks will be brought to patient room and the concentrator will be set up at the patient 's home.        Expected Discharge Plan and Services                                                 Social Determinants of Health (SDOH) Interventions    Readmission Risk Interventions No flowsheet data found.

## 2020-12-19 NOTE — Progress Notes (Signed)
SATURATION QUALIFICATIONS: (This note is used to comply with regulatory documentation for home oxygen)  Patient Saturations on Room Air at Rest = 92%  Patient Saturations on Room Air while Ambulating = 86%  Patient Saturations on 2 Liters of oxygen while Ambulating = 96%  Please briefly explain why patient needs home oxygen: Patient short of breath during ambulation can not maintain O2 sats during 6 mins walk, patient required 2L O2 to maintain 96%.

## 2020-12-28 ENCOUNTER — Other Ambulatory Visit: Payer: Self-pay

## 2020-12-28 ENCOUNTER — Telehealth (INDEPENDENT_AMBULATORY_CARE_PROVIDER_SITE_OTHER): Payer: Self-pay | Admitting: Thoracic Surgery (Cardiothoracic Vascular Surgery)

## 2020-12-28 DIAGNOSIS — K449 Diaphragmatic hernia without obstruction or gangrene: Secondary | ICD-10-CM

## 2020-12-28 NOTE — Progress Notes (Signed)
     [address]       [place] [address]             [phone]       Patient: Home Provider: Office Consent for Telemedicine visit obtained.  Today's visit was completed via a real-time telehealth (see specific modality noted below). The patient/authorized person provided oral consent at the time of the visit to engage in a telemedicine encounter with the present provider at [facility]. The patient/authorized person was informed of the potential benefits, limitations, and risks of telemedicine. The patient/authorized person expressed understanding that the laws that protect confidentiality also apply to telemedicine. The patient/authorized person acknowledged understanding that telemedicine does not provide emergency services and that he or she would need to call 911 or proceed to the nearest hospital for help if such a need arose.   Total time spent in the clinical discussion 10 minutes.  Telehealth Modality: Phone visit (audio only)  I had a telephone visit with [name].  She is s/p PEH repair.  She denies any dysphagia or reflux.  Her symptoms are much improved.    Ok to advance diet, and no longer crush meds F/U in 1 month with a CXR  Trooper

## 2021-01-10 ENCOUNTER — Telehealth: Payer: Self-pay

## 2021-01-10 ENCOUNTER — Other Ambulatory Visit: Payer: Self-pay | Admitting: Thoracic Surgery (Cardiothoracic Vascular Surgery)

## 2021-01-10 MED ORDER — METOCLOPRAMIDE HCL 5 MG PO TABS: 5.0000 mg | ORAL_TABLET | Freq: Three times a day (TID) | ORAL | 0 refills | Status: DC

## 2021-01-10 NOTE — Telephone Encounter (Signed)
Patient contacted the office concerned about nausea. She is s/p Robotic Paraesophageal hernia repair by Dr. Kipp Brood 8/8. She stated that since her diet was advanced to regular she has had trouble with nausea to the point she feels like she is going to pass out. Dr. Kipp Brood aware and sent prescription of Reglan into the pharmacy for patient to pick up. Reglan '5mg'$  TID before meals, and advised to eat all meals slowly. She acknowledged receipt.

## 2021-01-31 ENCOUNTER — Ambulatory Visit: Payer: Medicare Other

## 2021-02-01 ENCOUNTER — Ambulatory Visit: Payer: Medicare Other

## 2021-02-01 ENCOUNTER — Ambulatory Visit: Payer: Medicare Other | Admitting: Thoracic Surgery (Cardiothoracic Vascular Surgery)

## 2021-02-05 ENCOUNTER — Other Ambulatory Visit: Payer: Self-pay | Admitting: Thoracic Surgery (Cardiothoracic Vascular Surgery)

## 2021-02-05 DIAGNOSIS — Z8719 Personal history of other diseases of the digestive system: Secondary | ICD-10-CM

## 2021-02-07 ENCOUNTER — Ambulatory Visit
Admission: RE | Admit: 2021-02-07 | Discharge: 2021-02-07 | Disposition: A | Discharge status: Home / Self Care | Payer: Medicare Other | Source: Ambulatory Visit | Attending: Thoracic Surgery (Cardiothoracic Vascular Surgery) | Admitting: Thoracic Surgery (Cardiothoracic Vascular Surgery)

## 2021-02-07 ENCOUNTER — Other Ambulatory Visit: Payer: Self-pay

## 2021-02-07 ENCOUNTER — Encounter: Payer: Self-pay | Admitting: Surgical

## 2021-02-07 ENCOUNTER — Ambulatory Visit (INDEPENDENT_AMBULATORY_CARE_PROVIDER_SITE_OTHER): Payer: Self-pay | Admitting: Surgical

## 2021-02-07 VITALS — BP 126/81 | HR 71 | Resp 20 | Ht 63.0 in | Wt 214.0 lb

## 2021-02-07 DIAGNOSIS — Z8719 Personal history of other diseases of the digestive system: Secondary | ICD-10-CM

## 2021-02-07 DIAGNOSIS — K449 Diaphragmatic hernia without obstruction or gangrene: Secondary | ICD-10-CM

## 2021-02-07 DIAGNOSIS — Z9889 Other specified postprocedural states: Secondary | ICD-10-CM

## 2021-02-07 NOTE — Progress Notes (Signed)
[address]       [place] [address]             [phone]      Khylee M Derasmo [place] Medical Record #[id number] Date of Birth: Apr 12, 1961  Referring: Mauri Pole, MD Primary Care: Lorrene Reid, PA-C Primary Cardiologist: None   Chief Complaint:   POST OP FOLLOW UP 12/17/2020   Patient:  [name] Pre-Op Dx: Paraesophageal hernia GERD Obesity   Post-op Dx:  same Procedure: - Esophagoscopy - Robotic assisted laparoscopy - Paraesophageal hernia repair with Myriad mesh pledgets - Dor Fundoplication     Surgeon and Role:      * Lightfoot, Lucile Crater, MD - Primary    * E. Barrett, PA-C - assisting History of Present Illness:    The patient is a 60 year old female status post the above described procedure seen in the office on today's date and routine postsurgical follow-up.  In general the patient describes feeling much better than prior to the procedure.  She does continue to have some occasional dysphagia and nausea.  She has had a few episodes of vomiting but these have been relatively rare.  She notes that there are certain foods that she is still not ready to eat.  She describes some sharp pain in the right upper quadrant at times especially at night.  She also describes shortness of breath/DOE which is apparently a chronic issue that she experienced even prior to surgery.      Past Medical History:  Diagnosis Date   Acid reflux    takes Zantac and Omeprazole daily   Anemia    Anxiety    takes Citaopram daily   Arthritis    right knee   Arthrosis    left thumb [facility]   Chronic back pain    DDD   Clotting disorder (HCC)    hx of blood clot following knee scope   Depression    History of bronchitis 3+yrs ago   Hyperlipidemia    takes Atorvastatin daily   Hypertension    takes Lisinopril and HCTZ daily   Insomnia    takes Elavil nightly as needed   Paraesophageal hernia      Social History   Tobacco Use  Smoking  Status Never  Smokeless Tobacco Never    Social History   Substance and Sexual Activity  Alcohol Use No   Alcohol/week: 0.0 standard drinks     Allergies  Allergen Reactions   Tramadol Itching    "bugs crawling all over"    Current Outpatient Medications  Medication Sig Dispense Refill   albuterol (VENTOLIN HFA) 108 (90 Base) MCG/ACT inhaler Inhale 2 puffs into the lungs every 6 (six) hours as needed for wheezing or shortness of breath. 8 g 0   ciprofloxacin (CIPRO) 500 MG tablet Take 1 tablet (500 mg total) by mouth 2 (two) times daily. 5 tablet 0   FLUoxetine (PROZAC) 20 MG tablet Take 1.5 tablets (30 mg total) by mouth daily. Take 1.5 tablet by mouth daily. 135 tablet 0   hydrochlorothiazide (HYDRODIURIL) 50 MG tablet Take 1 tablet by mouth once daily 90 tablet 0   lisinopril (ZESTRIL) 40 MG tablet Take 1 tablet by mouth once daily 90 tablet 0   metoCLOPramide (REGLAN) 5 MG tablet Take 1 tablet (5 mg total) by mouth 3 (three) times daily before meals. 90 tablet 0   metoprolol tartrate (LOPRESSOR) 25 MG tablet  Take 0.5 tablets (12.5 mg total) by mouth 2 (two) times daily. 30 tablet 1   omeprazole (PRILOSEC) 20 MG capsule Take 1 capsule by mouth once daily 90 capsule 0   oxyCODONE (ROXICODONE) 5 MG immediate release tablet Take 1 tablet (5 mg total) by mouth every 6 (six) hours as needed for severe pain. 30 tablet 0   No current facility-administered medications for this visit.       Physical Exam: Ht '5\' 3"'  (1.6 m)   LMP 02/07/2013   BMI 38.67 kg/m   General appearance: alert, cooperative, and no distress Heart: regular rate and rhythm Lungs: clear to auscultation bilaterally Abdomen: Minor tenderness to palpation in the epigastrium. Extremities: No edema or calf tenderness Wound: Incisions well-healed without evidence of infection.   Diagnostic Studies & Laboratory data:     Recent Radiology Findings:   DG Chest 2 View  Result Date: 02/07/2021 CLINICAL DATA:   60 year old female status post paraesophageal hernia repair 9 weeks ago. EXAM: CHEST - 2 VIEW COMPARISON:  Chest x-ray 12/17/2020. FINDINGS: Lung volumes are normal. No consolidative airspace disease. No pleural effusions. No pneumothorax. No pulmonary nodule or mass noted. Pulmonary vasculature and the cardiomediastinal silhouette are within normal limits. Atherosclerosis in the thoracic aorta. IMPRESSION: 1.  No radiographic evidence of acute cardiopulmonary disease. 2. Aortic atherosclerosis. Electronically Signed   By: Vinnie Langton M.D.   On: 02/07/2021 10:38     DG Chest 2 View  Result Date: 02/07/2021 CLINICAL DATA:  60 year old female status post paraesophageal hernia repair 9 weeks ago. EXAM: CHEST - 2 VIEW COMPARISON:  Chest x-ray 12/17/2020. FINDINGS: Lung volumes are normal. No consolidative airspace disease. No pleural effusions. No pneumothorax. No pulmonary nodule or mass noted. Pulmonary vasculature and the cardiomediastinal silhouette are within normal limits. Atherosclerosis in the thoracic aorta. IMPRESSION: 1.  No radiographic evidence of acute cardiopulmonary disease. 2. Aortic atherosclerosis. Electronically Signed   By: Vinnie Langton M.D.   On: 02/07/2021 10:38    Recent Lab Findings: Lab Results  Component Value Date   WBC 13.3 (H) 12/18/2020   HGB 10.6 (L) 12/18/2020   HCT 32.8 (L) 12/18/2020   PLT 400 12/18/2020   GLUCOSE 145 (H) 12/18/2020   CHOL 236 (H) 06/29/2018   TRIG 122 06/29/2018   HDL 57 06/29/2018   LDLCALC 155 (H) 06/29/2018   ALT 20 12/14/2020   AST 23 12/14/2020   NA 136 12/18/2020   K 4.0 12/18/2020   CL 100 12/18/2020   CREATININE 1.43 (H) 12/18/2020   BUN 20 12/18/2020   CO2 26 12/18/2020   TSH 1.720 08/22/2019   INR 1.0 12/14/2020   HGBA1C 5.8 (H) 08/22/2019    Narrative & Impression  CLINICAL DATA:  Status post surgical repair of paraesophageal hernia.   EXAM: ESOPHOGRAM/BARIUM SWALLOW   TECHNIQUE: Single contrast examination was  performed using  Gastrografin.   FLUOROSCOPY TIME:  Radiation Exposure Index (if provided by the fluoroscopic device): 30.8 mGy.   COMPARISON:  None.   FINDINGS: No definite mass or stricture is noted in the esophagus. No definite leakage or extravasation is noted. Filling of the stomach is noted. Tertiary contractions are noted in the distal esophagus suggesting presbyesophagus. No definite hernia is noted.   IMPRESSION: No definite evidence of contrast extravasation or leakage is seen involving the esophagus. 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     Electronically Signed   By: Marijo Conception M.D.   On: 12/18/2020 12:18    Assessment /  Plan: The patient is overall doing well but continues to have some low-grade symptoms of nausea, rare vomiting and some dysphagia.  She also describes constipation.  I have encouraged her to use a laxative on a daily basis.  I have some concern that this right upper quadrant discomfort could be unrelated to her hernia procedure with other diagnoses such as gallbladder disease a possibility.  There could also be some postoperative swelling still contributing to the symptomatology.  In terms of her shortness of breath it appears to be a chronic issue that may require further evaluation by either her primary care or potentially pulmonary consultation.  We will make an appointment for the patient to see Dr. Kipp Brood in 1 month as this should be a good time interval to assess a postoperative result.  I told her to continue her current diet.      Medication Changes: No orders of the defined types were placed in this encounter.     John Giovanni, PA-C  02/07/2021 10:52 AM

## 2021-02-07 NOTE — Patient Instructions (Signed)
Continue same diet.  We will see in 1 month

## 2021-02-11 ENCOUNTER — Ambulatory Visit (HOSPITAL_BASED_OUTPATIENT_CLINIC_OR_DEPARTMENT_OTHER)
Admission: RE | Admit: 2021-02-11 | Discharge: 2021-02-11 | Disposition: A | Discharge status: Home / Self Care | Payer: Medicare Other | Source: Ambulatory Visit | Attending: Physician Assistant | Admitting: Physician Assistant

## 2021-02-11 ENCOUNTER — Other Ambulatory Visit: Payer: Self-pay

## 2021-02-11 DIAGNOSIS — Z1231 Encounter for screening mammogram for malignant neoplasm of breast: Secondary | ICD-10-CM | POA: Diagnosis not present

## 2021-02-12 ENCOUNTER — Encounter: Payer: Self-pay | Admitting: Physician Assistant

## 2021-02-12 ENCOUNTER — Ambulatory Visit (INDEPENDENT_AMBULATORY_CARE_PROVIDER_SITE_OTHER): Payer: Medicare Other | Admitting: Physician Assistant

## 2021-02-12 VITALS — BP 113/89 | HR 89 | Ht 63.0 in | Wt 214.0 lb

## 2021-02-12 DIAGNOSIS — R911 Solitary pulmonary nodule: Secondary | ICD-10-CM

## 2021-02-12 DIAGNOSIS — F419 Anxiety disorder, unspecified: Secondary | ICD-10-CM

## 2021-02-12 DIAGNOSIS — R0602 Shortness of breath: Secondary | ICD-10-CM | POA: Diagnosis not present

## 2021-02-12 DIAGNOSIS — Z0000 Encounter for general adult medical examination without abnormal findings: Secondary | ICD-10-CM

## 2021-02-12 DIAGNOSIS — K219 Gastro-esophageal reflux disease without esophagitis: Secondary | ICD-10-CM

## 2021-02-12 DIAGNOSIS — I10 Essential (primary) hypertension: Secondary | ICD-10-CM

## 2021-02-12 MED ORDER — FLUOXETINE HCL 20 MG PO TABS: 30.0000 mg | ORAL_TABLET | Freq: Every day | ORAL | 1 refills | Status: DC

## 2021-02-12 MED ORDER — LISINOPRIL 40 MG PO TABS: 40.0000 mg | ORAL_TABLET | Freq: Every day | ORAL | 1 refills | Status: DC

## 2021-02-12 MED ORDER — HYDROCHLOROTHIAZIDE 50 MG PO TABS: 50.0000 mg | ORAL_TABLET | Freq: Every day | ORAL | 1 refills | Status: DC

## 2021-02-12 MED ORDER — OMEPRAZOLE 20 MG PO CPDR: 20.0000 mg | DELAYED_RELEASE_CAPSULE | Freq: Every day | ORAL | 0 refills | Status: DC

## 2021-02-12 NOTE — Progress Notes (Signed)
Virtual Visit via Telephone Note:  I connected with [name] by telephone and verified that I am speaking with the correct person using two identifiers.    I discussed the limitations, risks, security and privacy concerns for performing an evaluation and management service by telephone and the availability of in person appointments. The staff discussed with patient that there may be a patient responsible charge related to this service. The patient expressed understanding and agreed to proceed.   Location of Patient- Home Location of Provider- Office   Subjective:   [name] is a 60 y.o. female who presents for Medicare Annual (Subsequent) preventive examination.  Review of Systems    General:   No F/C, wt loss Pulm:   No DIB, pleuritic chest pain, +SOB Card:  No CP, palpitations Abd:  No n/v/d or pain Ext:  No inc edema from baseline  Objective:    Today's Vitals   02/12/21 1355  BP: 113/89  Pulse: 89  Weight: 214 lb (97.1 kg)  Height: 5\' 3"  (1.6 m)   Body mass index is 37.91 kg/m.  Advanced Directives 12/17/2020 12/14/2020 04/26/2020 03/07/2020 02/29/2020 03/01/2019 01/21/2019  Does Patient Have a Medical Advance Directive? No No No No No No No  Would patient like information on creating a medical advance directive? No - Patient declined No - Patient declined No - Patient declined No - Patient declined No - Patient declined Yes (Inpatient - patient requests chaplain consult to create a medical advance directive) No - Patient declined    Current Medications (verified) Outpatient Encounter Medications as of 02/12/2021  Medication Sig   ciprofloxacin (CIPRO) 500 MG tablet Take 1 tablet (500 mg total) by mouth 2 (two) times daily.   metoCLOPramide (REGLAN) 5 MG tablet Take 1 tablet (5 mg total) by mouth 3 (three) times daily before meals.   metoprolol tartrate (LOPRESSOR) 25 MG tablet Take 0.5 tablets (12.5 mg total) by mouth 2 (two) times daily.   [DISCONTINUED] FLUoxetine  (PROZAC) 20 MG tablet Take 1.5 tablets (30 mg total) by mouth daily. Take 1.5 tablet by mouth daily.   [DISCONTINUED] hydrochlorothiazide (HYDRODIURIL) 50 MG tablet Take 1 tablet by mouth once daily   [DISCONTINUED] lisinopril (ZESTRIL) 40 MG tablet Take 1 tablet by mouth once daily   [DISCONTINUED] omeprazole (PRILOSEC) 20 MG capsule Take 1 capsule by mouth once daily   albuterol (VENTOLIN HFA) 108 (90 Base) MCG/ACT inhaler Inhale 2 puffs into the lungs every 6 (six) hours as needed for wheezing or shortness of breath. (Patient not taking: Reported on 02/12/2021)   FLUoxetine (PROZAC) 20 MG tablet Take 1.5 tablets (30 mg total) by mouth daily. Take 1.5 tablet by mouth daily.   hydrochlorothiazide (HYDRODIURIL) 50 MG tablet Take 1 tablet (50 mg total) by mouth daily.   lisinopril (ZESTRIL) 40 MG tablet Take 1 tablet (40 mg total) by mouth daily.   omeprazole (PRILOSEC) 20 MG capsule Take 1 capsule (20 mg total) by mouth daily.   oxyCODONE (ROXICODONE) 5 MG immediate release tablet Take 1 tablet (5 mg total) by mouth every 6 (six) hours as needed for severe pain. (Patient not taking: Reported on 02/12/2021)   No facility-administered encounter medications on file as of 02/12/2021.    Allergies (verified) Tramadol   History: Past Medical History:  Diagnosis Date   Acid reflux    takes Zantac and Omeprazole daily   Anemia    Anxiety    takes Citaopram daily   Arthritis  right knee   Arthrosis    left thumb [facility]   Chronic back pain    DDD   Clotting disorder (HCC)    hx of blood clot following knee scope   Depression    History of bronchitis 3+yrs ago   Hyperlipidemia    takes Atorvastatin daily   Hypertension    takes Lisinopril and HCTZ daily   Insomnia    takes Elavil nightly as needed   Paraesophageal hernia    Past Surgical History:  Procedure Laterality Date   BREAST BIOPSY Left    BUNIONECTOMY Right 02/18/2018   Procedure: Yehuda Budd;  Surgeon: Edrick Kins, DPM;  Location: [facility];  Service: Podiatry;  Laterality: Right;   CAPSULOTOMY Bilateral 02/18/2018   Procedure: CAPSULOTOMY MPJ RELEASE JOINT 2N BILATERAL;  Surgeon: Edrick Kins, DPM;  Location: [facility];  Service: Podiatry;  Laterality: Bilateral;   CARPOMETACARPEL SUSPENSION PLASTY Left 01/27/2018   Procedure: LEFT THUMB ligament reconstruction and tendon interposition;  Surgeon: Leandrew Koyanagi, MD;  Location: [facility];  Service: Orthopedics;  Laterality: Left;   CARPOMETACARPEL SUSPENSION PLASTY Left 03/07/2020   Procedure: REVISION LEFT THUMB CARPOMETACARPAL ([place]) ARTHROPLASTY;  Surgeon: Leandrew Koyanagi, MD;  Location: [facility];  Service: Orthopedics;  Laterality: Left;   CHONDROPLASTY Right 06/28/2014   Procedure: CHONDROPLASTY;  Surgeon: Marianna Payment, MD;  Location: [facility];  Service: Orthopedics;  Laterality: Right;   COLONOSCOPY N/A 05/29/2014   Procedure: COLONOSCOPY;  Surgeon: Daneil Dolin, MD;  [facility];  Service: Endoscopy;  Laterality: N/A;  215pm- Pt is working until 12:00 so she can't come any earlier   ESOPHAGOGASTRODUODENOSCOPY N/A 05/29/2014   Procedure: ESOPHAGOGASTRODUODENOSCOPY (EGD);  Surgeon: Daneil Dolin, MD;  [facility];  Service: Endoscopy;  Laterality: N/A;   ESOPHAGOGASTRODUODENOSCOPY N/A 12/17/2020   Procedure: ESOPHAGOGASTRODUODENOSCOPY (EGD);  Surgeon: Lajuana Matte, MD;  Location: [facility];  Service: Thoracic;  Laterality: N/A;   GANGLION CYST EXCISION Left 01/13/2002   HAMMER TOE SURGERY Bilateral 02/18/2018   Procedure: HAMMER TOE CORRECTION2ND BILATERAL;  Surgeon: Edrick Kins, DPM;  Location: [place] [facility];  Service: Podiatry;  Laterality: Bilateral;   KNEE ARTHROSCOPY WITH MEDIAL MENISECTOMY Right 06/28/2014   Procedure: RIGHT KNEE ARTHROSCOPY WITH PARTIAL MEDIAL MENISCECTOMY AND CHONDROPLASTY;  Surgeon: Marianna Payment, MD;  Location: [facility];  Service:  Orthopedics;  Laterality: Right;   MALONEY DILATION N/A 05/29/2014   Procedure: Venia Minks DILATION;  Surgeon: Daneil Dolin, MD;  [facility];  Service: Endoscopy;  Laterality: N/A;   PARTIAL KNEE ARTHROPLASTY Right 09/15/2014   Procedure: RIGHT UNICOMPARTMENTAL KNEE ARTHROPLASTY;  Surgeon: Leandrew Koyanagi, MD;  Location: [facility];  Service: Orthopedics;  Laterality: Right;   SHOULDER ARTHROSCOPY Right    TOTAL KNEE ARTHROPLASTY Left 04/08/2004   XI ROBOTIC ASSISTED PARAESOPHAGEAL HERNIA REPAIR N/A 12/17/2020   Procedure: XI ROBOTIC ASSISTED Laparoscopy PARAESOPHAGEAL HERNIA REPAIR WITH FUNDOPLICATION;  Surgeon: Lajuana Matte, MD;  Location: [facility];  Service: Thoracic;  Laterality: N/A;   Family History  Problem Relation Age of Onset   Cancer Father        lung   Lung cancer Father    GI problems Father    Cancer Sister        breast   Breast cancer Sister 86   Cancer Maternal Grandfather        lung   Lung cancer Maternal Grandfather  Cancer Paternal Grandfather        lung   Lung cancer Paternal Grandfather    Diabetes Son    Colon cancer Neg Hx    Esophageal cancer Neg Hx    Stomach cancer Neg Hx    Pancreatic cancer Neg Hx    Social History   Socioeconomic History   Marital status: Divorced    Spouse name: Not on file   Number of children: Not on file   Years of education: Not on file   Highest education level: Not on file  Occupational History   Occupation: Scientist, water quality  Tobacco Use   Smoking status: Never   Smokeless tobacco: Never  Vaping Use   Vaping Use: Never used  Substance and Sexual Activity   Alcohol use: No    Alcohol/week: 0.0 standard drinks   Drug use: No   Sexual activity: Not Currently    Birth control/protection: None, Post-menopausal  Other Topics Concern   Not on file  Social History Narrative   Not on file   Social Determinants of Health   Financial Resource Strain: Not on file  Food Insecurity: Not on file  Transportation Needs:  Not on file  Physical Activity: Not on file  Stress: Not on file  Social Connections: Not on file    Tobacco Counseling Counseling given: Not Answered    Diabetic? no    Activities of Daily Living In your present state of health, do you have any difficulty performing the following activities: 02/12/2021 12/17/2020  Hearing? N -  Vision? N -  Difficulty concentrating or making decisions? N -  Walking or climbing stairs? Y -  Comment - -  Dressing or bathing? N -  Doing errands, shopping? N N  Some recent data might be hidden    Patient Care Team: Lorrene Reid, PA-C as PCP - General (Physician Assistant) Daneil Dolin, MD as Consulting Physician (Gastroenterology) Leandrew Koyanagi, MD as Attending Physician (Orthopedic Surgery) Hurley Cisco, MD as Consulting Physician (Rheumatology)  Indicate any recent Medical Services you may have received from other than [facility] providers in the past year (date may be approximate).     Assessment:   This is a routine wellness examination for [name].  Hearing/Vision screen No results found.  Dietary issues and exercise activities discussed: -Patient is s/p paraesophageal hernia repair. Increase physical activity and advance diet as advised by surgeon.   Goals Addressed   None   Depression Screen PHQ 2/9 Scores 02/12/2021 10/25/2020 09/13/2020 06/04/2020 01/02/2020 11/02/2019 08/22/2019  PHQ - 2 Score 4 2 6 4 4 4 6   PHQ- 9 Score 9 5 16 14 14 13 20     Fall Risk Fall Risk  02/12/2021 10/25/2020 09/13/2020 06/04/2020 01/12/2020  Falls in the past year? 0 1 0 0 0  Number falls in past yr: 0 0 0 - -  Injury with Fall? 0 0 0 - -  Risk for fall due to : - - No Fall Risks - -  Follow up Falls evaluation completed Falls evaluation completed Falls evaluation completed Falls evaluation completed Falls evaluation completed    [place]:  Any stairs in or around the home? Yes  If so, are there any without handrails?  No  Home free of loose throw rugs in walkways, pet beds, electrical cords, etc? Yes  Adequate lighting in your home to reduce risk of falls? Yes   ASSISTIVE DEVICES UTILIZED TO PREVENT FALLS:  Life alert? No  Use of a cane, walker or w/c? No  Grab bars in the bathroom? No  Shower chair or bench in shower? No  Elevated toilet seat or a handicapped toilet? No   TIMED UP AND GO:  Was the test performed? No .  Length of time to ambulate 10 feet: 0 sec. Telehealth     Cognitive Function: wnl's     6CIT Screen 02/12/2021  What Year? 0 points  What month? 0 points  What time? 0 points  Count back from 20 0 points  Months in reverse 0 points  Repeat phrase 0 points  Total Score 0    Immunizations Immunization History  Administered Date(s) Administered   Tdap 04/29/2013    TDAP status: Up to date  Flu Vaccine status: Due, Education has been provided regarding the importance of this vaccine. Advised may receive this vaccine at local pharmacy or Health Dept. Aware to provide a copy of the vaccination record if obtained from local pharmacy or Health Dept. Verbalized acceptance and understanding.  Pneumococcal vaccine status: Due, Education has been provided regarding the importance of this vaccine. Advised may receive this vaccine at local pharmacy or Health Dept. Aware to provide a copy of the vaccination record if obtained from local pharmacy or Health Dept. Verbalized acceptance and understanding.  Covid-19 vaccine status: Declined, Education has been provided regarding the importance of this vaccine but patient still declined. Advised may receive this vaccine at local pharmacy or Health Dept.or vaccine clinic. Aware to provide a copy of the vaccination record if obtained from local pharmacy or Health Dept. Verbalized acceptance and understanding.  Qualifies for Shingles Vaccine? No   Zostavax completed No   Shingrix Completed?: No.    Education has been provided regarding the  importance of this vaccine. Patient has been advised to call insurance company to determine out of pocket expense if they have not yet received this vaccine. Advised may also receive vaccine at local pharmacy or Health Dept. Verbalized acceptance and understanding.  Screening Tests Health Maintenance  Topic Date Due   COVID-19 Vaccine (1) Never done   Hepatitis C Screening  Never done   Zoster Vaccines- Shingrix (1 of 2) Never done   MAMMOGRAM  07/17/2019   PAP SMEAR-Modifier  07/16/2020   INFLUENZA VACCINE  Never done   TETANUS/TDAP  04/30/2023   COLONOSCOPY (Pts 45-57yrs Insurance coverage will need to be confirmed)  10/17/2023   HIV Screening  Completed   HPV VACCINES  Aged Out    Health Maintenance  Health Maintenance Due  Topic Date Due   COVID-19 Vaccine (1) Never done   Hepatitis C Screening  Never done   Zoster Vaccines- Shingrix (1 of 2) Never done   MAMMOGRAM  07/17/2019   PAP SMEAR-Modifier  07/16/2020   INFLUENZA VACCINE  Never done    Colorectal cancer screening: Type of screening: Colonoscopy. Completed 10/16/2020. Repeat every 3 years  Mammogram status: Completed 02/12/2021. Repeat every year- report pending  Bone Density status: Ordered pt deferred. Pt provided with contact info and advised to call to schedule appt.  Lung Cancer Screening: (Low Dose CT Chest recommended if Age 79-80 years, 30 pack-year currently smoking OR have quit w/in 15years.) does not qualify.   Lung Cancer Screening Referral: no  Additional Screening:  Hepatitis C Screening: does not qualify; Completed n/a  Vision Screening: Recommended annual ophthalmology exams for early detection of glaucoma and other disorders of the eye. Is the patient up to date with their annual eye  exam?  No  Who is the provider or what is the name of the office in which the patient attends annual eye exams? N/a  If pt is not established with a provider, would they like to be referred to a provider to  establish care? Marland Kitchen No   Dental Screening: Recommended annual dental exams for proper oral hygiene  Community Resource Referral / Chronic Care Management: CRR required this visit?  No   CCM required this visit?  No     Plan:  -Will place new pulmonology referral for dyspnea and RUQ lung nodule. -Mammogram results pending. -Continue current medication regimen. Provided refills. -Continue to follow up with various specialists. -Follow up in 3 months for HTN, mood, shortness of breath and FBW  I have personally reviewed and noted the following in the patient's chart:   Medical and social history Use of alcohol, tobacco or illicit drugs  Current medications and supplements including opioid prescriptions.  Functional ability and status Nutritional status Physical activity Advanced directives List of other physicians Hospitalizations, surgeries, and ER visits in previous 12 months Vitals Screenings to include cognitive, depression, and falls Referrals and appointments  In addition, I have reviewed and discussed with patient certain preventive protocols, quality metrics, and best practice recommendations. A written personalized care plan for preventive services as well as general preventive health recommendations were provided to patient.     Lorrene Reid, PA-C   02/12/2021

## 2021-02-18 ENCOUNTER — Other Ambulatory Visit: Payer: Self-pay | Admitting: Physician Assistant

## 2021-02-18 DIAGNOSIS — R928 Other abnormal and inconclusive findings on diagnostic imaging of breast: Secondary | ICD-10-CM
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
## 2021-02-26 ENCOUNTER — Encounter: Payer: Self-pay | Admitting: Internal Medicine

## 2021-02-26 ENCOUNTER — Ambulatory Visit (INDEPENDENT_AMBULATORY_CARE_PROVIDER_SITE_OTHER): Payer: Medicare Other | Admitting: Internal Medicine

## 2021-02-26 ENCOUNTER — Other Ambulatory Visit: Payer: Self-pay

## 2021-02-26 VITALS — BP 116/71 | HR 62 | Resp 16 | Ht 63.0 in | Wt 211.0 lb

## 2021-02-26 DIAGNOSIS — M069 Rheumatoid arthritis, unspecified: Secondary | ICD-10-CM

## 2021-02-26 DIAGNOSIS — M797 Fibromyalgia: Secondary | ICD-10-CM | POA: Diagnosis not present

## 2021-02-26 DIAGNOSIS — M79604 Pain in right leg: Secondary | ICD-10-CM | POA: Diagnosis not present

## 2021-02-26 DIAGNOSIS — M79605 Pain in left leg: Secondary | ICD-10-CM

## 2021-02-26 NOTE — Patient Instructions (Signed)
I recommend checking out the [employer] patient-centered guide for fibromyalgia and chronic pain management: [email or web address]   

## 2021-03-06 ENCOUNTER — Encounter: Payer: Self-pay | Admitting: Neurology

## 2021-03-06 ENCOUNTER — Ambulatory Visit
Admission: RE | Admit: 2021-03-06 | Discharge: 2021-03-06 | Disposition: A | Discharge status: Home / Self Care | Payer: Medicare Other | Source: Ambulatory Visit | Attending: Physician Assistant | Admitting: Physician Assistant

## 2021-03-06 ENCOUNTER — Other Ambulatory Visit: Payer: Self-pay

## 2021-03-06 ENCOUNTER — Other Ambulatory Visit: Payer: Self-pay | Admitting: Physician Assistant

## 2021-03-06 DIAGNOSIS — R202 Paresthesia of skin: Secondary | ICD-10-CM

## 2021-03-06 DIAGNOSIS — R928 Other abnormal and inconclusive findings on diagnostic imaging of breast: Secondary | ICD-10-CM

## 2021-03-06 DIAGNOSIS — N6310 Unspecified lump in the right breast, unspecified quadrant: Secondary | ICD-10-CM

## 2021-03-08 ENCOUNTER — Ambulatory Visit: Payer: Self-pay | Admitting: Thoracic Surgery (Cardiothoracic Vascular Surgery)

## 2021-03-12 ENCOUNTER — Other Ambulatory Visit: Payer: Self-pay | Admitting: Physician Assistant

## 2021-03-12 DIAGNOSIS — I10 Essential (primary) hypertension: Secondary | ICD-10-CM

## 2021-03-15 ENCOUNTER — Ambulatory Visit (INDEPENDENT_AMBULATORY_CARE_PROVIDER_SITE_OTHER): Payer: Self-pay | Admitting: Thoracic Surgery (Cardiothoracic Vascular Surgery)

## 2021-03-15 ENCOUNTER — Other Ambulatory Visit: Payer: Self-pay

## 2021-03-15 VITALS — BP 126/80 | HR 69 | Resp 20 | Ht 66.0 in | Wt 216.0 lb

## 2021-03-15 DIAGNOSIS — K449 Diaphragmatic hernia without obstruction or gangrene: Secondary | ICD-10-CM

## 2021-03-15 DIAGNOSIS — Z8719 Personal history of other diseases of the digestive system: Secondary | ICD-10-CM

## 2021-03-15 DIAGNOSIS — Z9889 Other specified postprocedural states: Secondary | ICD-10-CM

## 2021-03-15 NOTE — Progress Notes (Signed)
      [address]       [place] [address]             [phone]        Mliss M Gfeller [facility] Medical Record #[id number] Date of Birth: Nov 10, 1960  Referring: Mauri Pole, MD Primary Care: Lorrene Reid, PA-C Primary Cardiologist:None  Reason for visit:   follow-up  History of Present Illness:     Ms. Huston presents in follow-up.  Overall she is doing well.  She denies any dysphagia or odontophagia.  She continues to take omeprazole once to come off of it.  Physical Exam: BP 126/80   Pulse 69   Resp 20   Ht 5\' 6"  (1.676 m)   Wt 216 lb (98 kg)   LMP 02/07/2013   SpO2 100% Comment: RA  BMI 34.86 kg/m   Alert NAD Incision clean.   Abdomen soft, ND No peripheral edema   Diagnostic Studies & Laboratory data: CXR: Clear     Assessment / Plan:   60 year old female status post robotic assisted paraesophageal hernia repair.  Currently doing quite well.  I instructed her to come off of her omeprazole.  She will follow-up as needed.   Lajuana Matte 03/15/2021 3:52 PM

## 2021-03-18 ENCOUNTER — Other Ambulatory Visit: Payer: Self-pay | Admitting: Physician Assistant

## 2021-03-18 ENCOUNTER — Other Ambulatory Visit: Payer: Self-pay

## 2021-03-18 ENCOUNTER — Ambulatory Visit
Admission: RE | Admit: 2021-03-18 | Discharge: 2021-03-18 | Disposition: A | Discharge status: Home / Self Care | Payer: Medicare Other | Source: Ambulatory Visit | Attending: Physician Assistant | Admitting: Physician Assistant

## 2021-03-18 DIAGNOSIS — N6310 Unspecified lump in the right breast, unspecified quadrant: Secondary | ICD-10-CM

## 2021-03-20 ENCOUNTER — Telehealth: Payer: Self-pay | Admitting: Hematology and Oncology

## 2021-03-20 NOTE — Telephone Encounter (Signed)
Spoke to patient to confirm afternoon [facility] appointment for 11/16, packet will be mailed

## 2021-03-22 ENCOUNTER — Encounter: Payer: Self-pay | Admitting: *Deleted

## 2021-03-26 ENCOUNTER — Encounter: Payer: Self-pay | Admitting: *Deleted

## 2021-03-26 ENCOUNTER — Other Ambulatory Visit: Payer: Self-pay | Admitting: *Deleted

## 2021-03-26 DIAGNOSIS — Z170 Estrogen receptor positive status [ER+]: Secondary | ICD-10-CM | POA: Insufficient documentation

## 2021-03-26 DIAGNOSIS — C50411 Malignant neoplasm of upper-outer quadrant of right female breast: Secondary | ICD-10-CM

## 2021-03-26 NOTE — Progress Notes (Signed)
[place] NOTE  Patient Care Team: Lorrene Reid, PA-C as PCP - General (Physician Assistant) Gala Romney, Cristopher Estimable, MD as Consulting Physician (Gastroenterology) Leandrew Koyanagi, MD as Attending Physician (Orthopedic Surgery) Hurley Cisco, MD as Consulting Physician (Rheumatology) Mauro Kaufmann, RN as Oncology Nurse Navigator Rockwell Germany, RN as Oncology Nurse Navigator Jovita Kussmaul, MD as Consulting Physician (General Surgery) Nicholas Lose, MD as Consulting Physician (Hematology and Oncology) Kyung Rudd, MD as Consulting Physician (Radiation Oncology)  CHIEF COMPLAINTS/PURPOSE OF CONSULTATION:  Newly diagnosed right breast cancer  HISTORY OF PRESENTING ILLNESS:  [name] 60 y.o. 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 Screening mammogram on 02/11/2021 showed a possible mass in the right breast, and no evidence of malignancy in the left breast. Diagnostic mammogram and [place] on 03/06/2021 showed highly suspicious 0.9 cm upper outer right breast mass. Biopsy on 03/18/2021 showed invasive ductal carcinoma and DCIS. She presents to the clinic today for initial evaluation and discussion of treatment options.   I reviewed her records extensively and collaborated the history with the patient.  SUMMARY OF ONCOLOGIC HISTORY: Oncology History  Malignant neoplasm of upper-outer quadrant of right breast in female, estrogen receptor positive ([place])  03/18/2021 Initial Diagnosis   Screening mammogram: a possible mass and distortion in the right breast, Diagnostic mammogram: highly suspicious 0.9 cm upper outer right breast mass. Biopsy: Grade 1 IDC with DCIS ER 100%, PR 1%, HER2 negative by [facility]   03/27/2021 Cancer Staging   Staging form: Breast, AJCC 8th Edition - Clinical stage from 03/27/2021: Stage IA (cT1b, cN0, cM0, G1, ER+, PR+, HER2-) - Signed by Nicholas Lose, MD on 03/27/2021 Stage prefix:  Initial diagnosis Histologic grading system: 3 grade system      MEDICAL HISTORY:  Past Medical History:  Diagnosis Date   Acid reflux    takes Zantac and Omeprazole daily   Anemia    Anxiety    takes Citaopram daily   Arthritis    right knee   Arthrosis    left thumb CMC   Chronic back pain    DDD   Clotting disorder (HCC)    hx of blood clot following knee scope   Depression    Fibromyalgia    History of bronchitis 3+yrs ago   Hyperlipidemia    takes Atorvastatin daily   Hypertension    takes Lisinopril and HCTZ daily   Insomnia    takes Elavil nightly as needed   Paraesophageal hernia     SURGICAL HISTORY: Past Surgical History:  Procedure Laterality Date   BREAST BIOPSY Left    BUNIONECTOMY Right 02/18/2018   Procedure: Yehuda Budd;  Surgeon: Edrick Kins, DPM;  Location: [place];  Service: Podiatry;  Laterality: Right;   CAPSULOTOMY Bilateral 02/18/2018   Procedure: CAPSULOTOMY MPJ RELEASE JOINT 2N BILATERAL;  Surgeon: Edrick Kins, DPM;  Location: [place];  Service: Podiatry;  Laterality: Bilateral;   CARPOMETACARPEL SUSPENSION PLASTY Left 01/27/2018   Procedure: LEFT THUMB ligament reconstruction and tendon interposition;  Surgeon: Leandrew Koyanagi, MD;  Location: [place];  Service: Orthopedics;  Laterality: Left;   CARPOMETACARPEL SUSPENSION PLASTY Left 03/07/2020   Procedure: REVISION LEFT THUMB CARPOMETACARPAL ([place]) ARTHROPLASTY;  Surgeon: Leandrew Koyanagi, MD;  Location: [place];  Service: Orthopedics;  Laterality: Left;   CHONDROPLASTY Right 06/28/2014   Procedure: CHONDROPLASTY;  Surgeon: Marianna Payment, MD;  Location: [place]  [place];  Service: Orthopedics;  Laterality: Right;   COLONOSCOPY N/A 05/29/2014   Procedure: COLONOSCOPY;  Surgeon: Daneil Dolin, MD;  [facility];  Service: Endoscopy;  Laterality: N/A;  215pm- Pt is working until 12:00 so she can't come any earlier    ESOPHAGOGASTRODUODENOSCOPY N/A 05/29/2014   Procedure: ESOPHAGOGASTRODUODENOSCOPY (EGD);  Surgeon: Daneil Dolin, MD;  [facility];  Service: Endoscopy;  Laterality: N/A;   ESOPHAGOGASTRODUODENOSCOPY N/A 12/17/2020   Procedure: ESOPHAGOGASTRODUODENOSCOPY (EGD);  Surgeon: Lajuana Matte, MD;  Location: [place];  Service: Thoracic;  Laterality: N/A;   GANGLION CYST EXCISION Left 01/13/2002   HAMMER TOE SURGERY Bilateral 02/18/2018   Procedure: HAMMER TOE CORRECTION2ND BILATERAL;  Surgeon: Edrick Kins, DPM;  Location: [place];  Service: Podiatry;  Laterality: Bilateral;   HERNIA REPAIR     KNEE ARTHROSCOPY WITH MEDIAL MENISECTOMY Right 06/28/2014   Procedure: RIGHT KNEE ARTHROSCOPY WITH PARTIAL MEDIAL MENISCECTOMY AND CHONDROPLASTY;  Surgeon: Marianna Payment, MD;  Location: [place];  Service: Orthopedics;  Laterality: Right;   MALONEY DILATION N/A 05/29/2014   Procedure: Venia Minks DILATION;  Surgeon: Daneil Dolin, MD;  [facility];  Service: Endoscopy;  Laterality: N/A;   PARTIAL KNEE ARTHROPLASTY Right 09/15/2014   Procedure: RIGHT UNICOMPARTMENTAL KNEE ARTHROPLASTY;  Surgeon: Leandrew Koyanagi, MD;  Location: [place];  Service: Orthopedics;  Laterality: Right;   SHOULDER ARTHROSCOPY Right    TOTAL KNEE ARTHROPLASTY Left 04/08/2004   XI ROBOTIC ASSISTED PARAESOPHAGEAL HERNIA REPAIR N/A 12/17/2020   Procedure: XI ROBOTIC ASSISTED Laparoscopy PARAESOPHAGEAL HERNIA REPAIR WITH FUNDOPLICATION;  Surgeon: Lajuana Matte, MD;  Location: [place];  Service: Thoracic;  Laterality: N/A;    SOCIAL HISTORY: Social History   Socioeconomic History   Marital status: Divorced    Spouse name: Not on file   Number of children: Not on file   Years of education: Not on file   Highest education level: Not on file  Occupational History   Occupation: Scientist, water quality  Tobacco Use   Smoking status: Never   Smokeless tobacco: Never  Vaping Use   Vaping Use: Never used   Substance and Sexual Activity   Alcohol use: No    Alcohol/week: 0.0 standard drinks   Drug use: No   Sexual activity: Not Currently    Birth control/protection: None, Post-menopausal  Other Topics Concern   Not on file  Social History Narrative   Not on file   Social Determinants of Health   Financial Resource Strain: Not on file  Food Insecurity: Not on file  Transportation Needs: Not on file  Physical Activity: Not on file  Stress: Not on file  Social Connections: Not on file  Intimate Partner Violence: Not on file    FAMILY HISTORY: Family History  Problem Relation Age of Onset   Stroke Mother    Cancer Father        lung   Lung cancer Father    GI problems Father    Cancer Sister        breast   Breast cancer Sister 51   Cancer Maternal Grandfather        lung   Lung cancer Maternal Grandfather    Cancer Paternal Grandfather        lung   Lung cancer Paternal Grandfather    Diabetes Son    Colon cancer Neg Hx    Esophageal cancer Neg Hx    Pancreatic cancer Neg Hx  ALLERGIES:  is allergic to tramadol.  MEDICATIONS:  Current Outpatient Medications  Medication Sig Dispense Refill   albuterol (VENTOLIN HFA) 108 (90 Base) MCG/ACT inhaler Inhale 2 puffs into the lungs every 6 (six) hours as needed for wheezing or shortness of breath. (Patient not taking: Reported on 03/15/2021) 8 g 0   ciprofloxacin (CIPRO) 500 MG tablet Take 1 tablet (500 mg total) by mouth 2 (two) times daily. (Patient not taking: Reported on 03/15/2021) 5 tablet 0   FLUoxetine (PROZAC) 20 MG tablet Take 1.5 tablets (30 mg total) by mouth daily. Take 1.5 tablet by mouth daily. 135 tablet 1   hydrochlorothiazide (HYDRODIURIL) 50 MG tablet Take 1 tablet (50 mg total) by mouth daily. 90 tablet 1   lisinopril (ZESTRIL) 40 MG tablet Take 1 tablet (40 mg total) by mouth daily. 90 tablet 1   metoCLOPramide (REGLAN) 5 MG tablet Take 1 tablet (5 mg total) by mouth 3 (three) times daily before meals.  (Patient not taking: Reported on 03/15/2021) 90 tablet 0   metoprolol succinate (TOPROL-XL) 25 MG 24 hr tablet Take 1 tablet by mouth once daily 90 tablet 0   metoprolol tartrate (LOPRESSOR) 25 MG tablet Take 0.5 tablets (12.5 mg total) by mouth 2 (two) times daily. (Patient not taking: Reported on 03/15/2021) 30 tablet 1   omeprazole (PRILOSEC) 20 MG capsule Take 1 capsule (20 mg total) by mouth daily. 90 capsule 0   oxyCODONE (ROXICODONE) 5 MG immediate release tablet Take 1 tablet (5 mg total) by mouth every 6 (six) hours as needed for severe pain. (Patient not taking: Reported on 03/15/2021) 30 tablet 0   No current facility-administered medications for this visit.    REVIEW OF SYSTEMS:   Constitutional: Denies fevers, chills or abnormal night sweats Eyes: Denies blurriness of vision, double vision or watery eyes Ears, nose, mouth, throat, and face: Denies mucositis or sore throat Respiratory: Denies cough, dyspnea or wheezes Cardiovascular: Denies palpitation, chest discomfort or lower extremity swelling Gastrointestinal:  Denies nausea, heartburn or change in bowel habits Skin: Denies abnormal skin rashes Lymphatics: Denies new lymphadenopathy or easy bruising Neurological:Denies numbness, tingling or new weaknesses Behavioral/Psych: Mood is stable, no new changes  Breast:  Denies any palpable lumps or discharge All other systems were reviewed with the patient and are negative.  PHYSICAL EXAMINATION: ECOG PERFORMANCE STATUS: 1 - Symptomatic but completely ambulatory  Vitals:   03/27/21 1257  BP: (!) 150/63  Pulse: 67  Resp: 18  Temp: 97.9 F (36.6 C)  SpO2: 100%   Filed Weights   03/27/21 1257  Weight: 216 lb 1.6 oz (98 kg)      LABORATORY DATA:  I have reviewed the data as listed Lab Results  Component Value Date   WBC 7.6 03/27/2021   HGB 10.9 (L) 03/27/2021   HCT 33.9 (L) 03/27/2021   MCV 87.4 03/27/2021   PLT 421 (H) 03/27/2021   Lab Results  Component Value  Date   NA 137 03/27/2021   K 4.3 03/27/2021   CL 101 03/27/2021   CO2 28 03/27/2021    RADIOGRAPHIC STUDIES: I have personally reviewed the radiological reports and agreed with the findings in the report.  ASSESSMENT AND PLAN:  Malignant neoplasm of upper-outer quadrant of right breast in female, estrogen receptor positive ([place]) 03/18/2021:Screening mammogram: a possible mass and distortion in the right breast, Diagnostic mammogram: highly suspicious 0.9 cm upper outer right breast mass. Biopsy: Grade 1 IDC with DCIS ER 100%, PR 1%, HER2 negative by  [facility]  Pathology and radiology counseling:Discussed with the patient, the details of pathology including the type of breast cancer,the clinical staging, the significance of ER, PR and HER-2/neu receptors and the implications for treatment. After reviewing the pathology in detail, we proceeded to discuss the different treatment options between surgery, radiation, chemotherapy, antiestrogen therapies.  Recommendations: 1. Breast conserving surgery followed by 2. +/- Oncotype DX testing depending on final pathology 3. Adjuvant radiation therapy followed by 4. Adjuvant antiestrogen therapy  Oncotype counseling: I discussed Oncotype DX test. I explained to the patient that this is a 21 gene panel to evaluate patient tumors DNA to calculate recurrence score. This would help determine whether patient has high risk or low risk breast cancer. She understands that if her tumor was found to be high risk, she would benefit from systemic chemotherapy. If low risk, no need of chemotherapy.  Return to clinic after surgery to discuss final pathology report and then determine if Oncotype DX testing will need to be sent.    All questions were answered. The patient knows to call the clinic with any problems, questions or concerns.   Rulon Eisenmenger, MD, MPH 03/27/2021    I, Thana Ates, am acting as scribe for Nicholas Lose, MD.  I have reviewed the above  documentation for accuracy and completeness, and I agree with the above.

## 2021-03-27 ENCOUNTER — Inpatient Hospital Stay: Payer: Medicare Other | Attending: Hematology and Oncology

## 2021-03-27 ENCOUNTER — Ambulatory Visit: Payer: Medicare Other | Admitting: Physical Therapy

## 2021-03-27 ENCOUNTER — Encounter: Payer: Self-pay | Admitting: *Deleted

## 2021-03-27 ENCOUNTER — Ambulatory Visit (HOSPITAL_BASED_OUTPATIENT_CLINIC_OR_DEPARTMENT_OTHER): Payer: Medicare Other | Admitting: Genetic Counselor

## 2021-03-27 ENCOUNTER — Encounter: Payer: Self-pay | Admitting: Physical Therapy

## 2021-03-27 ENCOUNTER — Other Ambulatory Visit: Payer: Self-pay

## 2021-03-27 ENCOUNTER — Encounter: Payer: Self-pay | Admitting: General Practice

## 2021-03-27 ENCOUNTER — Ambulatory Visit
Admission: RE | Admit: 2021-03-27 | Discharge: 2021-03-27 | Disposition: A | Discharge status: Home / Self Care | Payer: Medicare Other | Source: Ambulatory Visit | Attending: Radiation Oncology | Admitting: Radiation Oncology

## 2021-03-27 ENCOUNTER — Inpatient Hospital Stay (HOSPITAL_BASED_OUTPATIENT_CLINIC_OR_DEPARTMENT_OTHER): Payer: Medicare Other | Admitting: Hematology and Oncology

## 2021-03-27 DIAGNOSIS — Z888 Allergy status to other drugs, medicaments and biological substances status: Secondary | ICD-10-CM | POA: Insufficient documentation

## 2021-03-27 DIAGNOSIS — Z170 Estrogen receptor positive status [ER+]: Secondary | ICD-10-CM | POA: Insufficient documentation

## 2021-03-27 DIAGNOSIS — E785 Hyperlipidemia, unspecified: Secondary | ICD-10-CM | POA: Diagnosis not present

## 2021-03-27 DIAGNOSIS — Z803 Family history of malignant neoplasm of breast: Secondary | ICD-10-CM

## 2021-03-27 DIAGNOSIS — K219 Gastro-esophageal reflux disease without esophagitis: Secondary | ICD-10-CM | POA: Diagnosis not present

## 2021-03-27 DIAGNOSIS — F419 Anxiety disorder, unspecified: Secondary | ICD-10-CM | POA: Insufficient documentation

## 2021-03-27 DIAGNOSIS — Z885 Allergy status to narcotic agent status: Secondary | ICD-10-CM | POA: Diagnosis not present

## 2021-03-27 DIAGNOSIS — D699 Hemorrhagic condition, unspecified: Secondary | ICD-10-CM | POA: Insufficient documentation

## 2021-03-27 DIAGNOSIS — R233 Spontaneous ecchymoses: Secondary | ICD-10-CM | POA: Insufficient documentation

## 2021-03-27 DIAGNOSIS — Z801 Family history of malignant neoplasm of trachea, bronchus and lung: Secondary | ICD-10-CM | POA: Diagnosis not present

## 2021-03-27 DIAGNOSIS — Z8379 Family history of other diseases of the digestive system: Secondary | ICD-10-CM | POA: Diagnosis not present

## 2021-03-27 DIAGNOSIS — Z8042 Family history of malignant neoplasm of prostate: Secondary | ICD-10-CM | POA: Diagnosis not present

## 2021-03-27 DIAGNOSIS — I10 Essential (primary) hypertension: Secondary | ICD-10-CM | POA: Diagnosis not present

## 2021-03-27 DIAGNOSIS — C50411 Malignant neoplasm of upper-outer quadrant of right female breast: Secondary | ICD-10-CM

## 2021-03-27 DIAGNOSIS — M797 Fibromyalgia: Secondary | ICD-10-CM | POA: Insufficient documentation

## 2021-03-27 DIAGNOSIS — R293 Abnormal posture: Secondary | ICD-10-CM | POA: Insufficient documentation

## 2021-03-27 DIAGNOSIS — Z79899 Other long term (current) drug therapy: Secondary | ICD-10-CM | POA: Insufficient documentation

## 2021-03-27 DIAGNOSIS — Z823 Family history of stroke: Secondary | ICD-10-CM | POA: Insufficient documentation

## 2021-03-27 DIAGNOSIS — Z8659 Personal history of other mental and behavioral disorders: Secondary | ICD-10-CM | POA: Insufficient documentation

## 2021-03-27 DIAGNOSIS — Z833 Family history of diabetes mellitus: Secondary | ICD-10-CM | POA: Insufficient documentation

## 2021-03-27 LAB — CBC WITH DIFFERENTIAL (CANCER CENTER ONLY)
Abs Immature Granulocytes: 0.03 10*3/uL (ref 0.00–0.07)
Basophils Absolute: 0.1 10*3/uL (ref 0.0–0.1)
Basophils Relative: 1 %
Eosinophils Absolute: 0.2 10*3/uL (ref 0.0–0.5)
Eosinophils Relative: 3 %
HCT: 33.9 % — ABNORMAL LOW (ref 36.0–46.0)
Hemoglobin: 10.9 g/dL — ABNORMAL LOW (ref 12.0–15.0)
Immature Granulocytes: 0 %
Lymphocytes Relative: 35 %
Lymphs Abs: 2.7 10*3/uL (ref 0.7–4.0)
MCH: 28.1 pg (ref 26.0–34.0)
MCHC: 32.2 g/dL (ref 30.0–36.0)
MCV: 87.4 fL (ref 80.0–100.0)
Monocytes Absolute: 0.9 10*3/uL (ref 0.1–1.0)
Monocytes Relative: 12 %
Neutro Abs: 3.7 10*3/uL (ref 1.7–7.7)
Neutrophils Relative %: 49 %
Platelet Count: 421 10*3/uL — ABNORMAL HIGH (ref 150–400)
RBC: 3.88 MIL/uL (ref 3.87–5.11)
RDW: 15.9 % — ABNORMAL HIGH (ref 11.5–15.5)
WBC Count: 7.6 10*3/uL (ref 4.0–10.5)
nRBC: 0 % (ref 0.0–0.2)

## 2021-03-27 LAB — GENETIC SCREENING ORDER

## 2021-03-27 LAB — CMP (CANCER CENTER ONLY)
ALT: 17 U/L (ref 0–44)
AST: 20 U/L (ref 15–41)
Albumin: 3.6 g/dL (ref 3.5–5.0)
Alkaline Phosphatase: 80 U/L (ref 38–126)
Anion gap: 8 (ref 5–15)
BUN: 17 mg/dL (ref 6–20)
CO2: 28 mmol/L (ref 22–32)
Calcium: 9.6 mg/dL (ref 8.9–10.3)
Chloride: 101 mmol/L (ref 98–111)
Creatinine: 1.05 mg/dL — ABNORMAL HIGH (ref 0.44–1.00)
GFR, Estimated: >60 mL/min (ref >60)
Glucose, Bld: 101 mg/dL — ABNORMAL HIGH (ref 70–99)
Potassium: 4.3 mmol/L (ref 3.5–5.1)
Sodium: 137 mmol/L (ref 135–145)
Total Bilirubin: 0.4 mg/dL (ref 0.3–1.2)
Total Protein: 6.9 g/dL (ref 6.5–8.1)

## 2021-03-27 NOTE — Assessment & Plan Note (Signed)
03/18/2021:Screening mammogram: a possible mass and distortion in the right breast, Diagnostic mammogram: highly suspicious 0.9 cm upper outer right breast mass. Biopsy: Grade 1 IDC with DCIS ER 100%, PR 1%, HER2 negative by [facility]  Pathology and radiology counseling:Discussed with the patient, the details of pathology including the type of breast cancer,the clinical staging, the significance of ER, PR and HER-2/neu receptors and the implications for treatment. After reviewing the pathology in detail, we proceeded to discuss the different treatment options between surgery, radiation, chemotherapy, antiestrogen therapies.  Recommendations: 1. Breast conserving surgery followed by 2. +/- Oncotype DX testing depending on final pathology 3. Adjuvant radiation therapy followed by 4. Adjuvant antiestrogen therapy  Oncotype counseling: I discussed Oncotype DX test. I explained to the patient that this is a 21 gene panel to evaluate patient tumors DNA to calculate recurrence score. This would help determine whether patient has high risk or low risk breast cancer. She understands that if her tumor was found to be high risk, she would benefit from systemic chemotherapy. If low risk, no need of chemotherapy.  Return to clinic after surgery to discuss final pathology report and then determine if Oncotype DX testing will need to be sent.

## 2021-03-27 NOTE — Therapy (Signed)
[facility] [address] [place], [place], [address] Phone: [phone]   Fax:  [phone]  Physical Therapy Evaluation  Patient Details  Name: [name] MRN: [id number] Date of Birth: June 23, 1960 Referring Provider (PT): Dr. Autumn Messing   Encounter Date: 03/27/2021   PT End of Session - 03/27/21 2018     Visit Number 1    Number of Visits 2    Date for PT Re-Evaluation 05/22/21    PT Start Time 8127    PT Stop Time 1521    PT Time Calculation (min) 22 min    Activity Tolerance Patient tolerated treatment well    Behavior During Therapy [facility] for tasks assessed/performed             Past Medical History:  Diagnosis Date   Acid reflux    takes Zantac and Omeprazole daily   Anemia    Anxiety    takes Citaopram daily   Arthritis    right knee   Arthrosis    left thumb CMC   Chronic back pain    DDD   Clotting disorder (HCC)    hx of blood clot following knee scope   Depression    Fibromyalgia    History of bronchitis 3+yrs ago   Hyperlipidemia    takes Atorvastatin daily   Hypertension    takes Lisinopril and HCTZ daily   Insomnia    takes Elavil nightly as needed   Paraesophageal hernia     Past Surgical History:  Procedure Laterality Date   BREAST BIOPSY Left    BUNIONECTOMY Right 02/18/2018   Procedure: Yehuda Budd;  Surgeon: Edrick Kins, DPM;  Location: [place];  Service: Podiatry;  Laterality: Right;   CAPSULOTOMY Bilateral 02/18/2018   Procedure: CAPSULOTOMY MPJ RELEASE JOINT 2N BILATERAL;  Surgeon: Edrick Kins, DPM;  Location: [place];  Service: Podiatry;  Laterality: Bilateral;   CARPOMETACARPEL SUSPENSION PLASTY Left 01/27/2018   Procedure: LEFT THUMB ligament reconstruction and tendon interposition;  Surgeon: Leandrew Koyanagi, MD;  Location: [place];  Service: Orthopedics;  Laterality: Left;   CARPOMETACARPEL SUSPENSION PLASTY Left 03/07/2020   Procedure:  REVISION LEFT THUMB CARPOMETACARPAL ([place]) ARTHROPLASTY;  Surgeon: Leandrew Koyanagi, MD;  Location: [place];  Service: Orthopedics;  Laterality: Left;   CHONDROPLASTY Right 06/28/2014   Procedure: CHONDROPLASTY;  Surgeon: Marianna Payment, MD;  Location: [place];  Service: Orthopedics;  Laterality: Right;   COLONOSCOPY N/A 05/29/2014   Procedure: COLONOSCOPY;  Surgeon: Daneil Dolin, MD;  [facility];  Service: Endoscopy;  Laterality: N/A;  215pm- Pt is working until 12:00 so she can't come any earlier   ESOPHAGOGASTRODUODENOSCOPY N/A 05/29/2014   Procedure: ESOPHAGOGASTRODUODENOSCOPY (EGD);  Surgeon: Daneil Dolin, MD;  [facility];  Service: Endoscopy;  Laterality: N/A;   ESOPHAGOGASTRODUODENOSCOPY N/A 12/17/2020   Procedure: ESOPHAGOGASTRODUODENOSCOPY (EGD);  Surgeon: Lajuana Matte, MD;  Location: [place];  Service: Thoracic;  Laterality: N/A;   GANGLION CYST EXCISION Left 01/13/2002   HAMMER TOE SURGERY Bilateral 02/18/2018   Procedure: HAMMER TOE CORRECTION2ND BILATERAL;  Surgeon: Edrick Kins, DPM;  Location: [place];  Service: Podiatry;  Laterality: Bilateral;   HERNIA REPAIR     KNEE ARTHROSCOPY WITH MEDIAL MENISECTOMY Right 06/28/2014   Procedure: RIGHT KNEE ARTHROSCOPY WITH PARTIAL MEDIAL MENISCECTOMY AND CHONDROPLASTY;  Surgeon: Marianna Payment, MD;  Location: [place];  Service: Orthopedics;  Laterality: Right;   MALONEY DILATION N/A 05/29/2014   Procedure: Venia Minks DILATION;  Surgeon: Daneil Dolin, MD;  [facility];  Service: Endoscopy;  Laterality: N/A;   PARTIAL KNEE ARTHROPLASTY Right 09/15/2014   Procedure: RIGHT UNICOMPARTMENTAL KNEE ARTHROPLASTY;  Surgeon: Leandrew Koyanagi, MD;  Location: [place];  Service: Orthopedics;  Laterality: Right;   SHOULDER ARTHROSCOPY Right    TOTAL KNEE ARTHROPLASTY Left 04/08/2004   XI ROBOTIC ASSISTED PARAESOPHAGEAL HERNIA REPAIR N/A 12/17/2020    Procedure: XI ROBOTIC ASSISTED Laparoscopy PARAESOPHAGEAL HERNIA REPAIR WITH FUNDOPLICATION;  Surgeon: Lajuana Matte, MD;  Location: [place];  Service: Thoracic;  Laterality: N/A;    There were no vitals filed for this visit.    Subjective Assessment - 03/27/21 1952     Subjective Patient reports she is here today to be seen by her medical team for her newly diagnosed right breast cancer.    Pertinent History Patient was diagnosed on 02/11/2021 with right grade I invasive ductal carcinoma breast cancer. It measures 9 mm and is lcoated in the upper outer quadrant. It is ER/PR positive and HER2 negative with a Ki67 of 5%. She has a history of bilateral total knee replacements in 2007 (left) and 2017 (right).    Patient Stated Goals Reduce lymphedema risk and learn post op HEP    Currently in Pain? No/denies                [facility] PT Assessment - 03/27/21 0001       Assessment   Medical Diagnosis Right breast cancer    Referring Provider (PT) Dr. Autumn Messing    Onset Date/Surgical Date 02/11/21    Hand Dominance Right    Prior Therapy none      Precautions   Precautions Other (comment)    Precaution Comments active cancer      Restrictions   Weight Bearing Restrictions No      Balance Screen   Has the patient fallen in the past 6 months No    Has the patient had a decrease in activity level because of a fear of falling?  No    Is the patient reluctant to leave their home because of a fear of falling?  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      Cognition   Overall Cognitive Status Within Functional Limits for tasks assessed      Posture/Postural Control   Posture/Postural Control Postural limitations     Postural Limitations Rounded Shoulders;Forward head;Increased thoracic kyphosis      ROM / Strength   AROM / PROM / Strength AROM;Strength      AROM   Overall AROM Comments Cervical AROM is WNL    AROM Assessment Site Shoulder    Right/Left Shoulder Right;Left    Right Shoulder Extension 51 Degrees    Right Shoulder Flexion 141 Degrees    Right Shoulder ABduction 146 Degrees    Right Shoulder Internal Rotation 64 Degrees    Right Shoulder External Rotation 75 Degrees    Left Shoulder Extension 55 Degrees    Left Shoulder Flexion 143 Degrees    Left Shoulder ABduction 152 Degrees  Left Shoulder Internal Rotation 67 Degrees    Left Shoulder External Rotation 85 Degrees      Strength   Overall Strength Within functional limits for tasks performed               LYMPHEDEMA/ONCOLOGY QUESTIONNAIRE - 03/27/21 0001       Type   Cancer Type Right breast cancer      Lymphedema Assessments   Lymphedema Assessments Upper extremities      Right Upper Extremity Lymphedema   10 cm Proximal to Olecranon Process 35.4 cm    Olecranon Process 27.2 cm    10 cm Proximal to Ulnar Styloid Process 24.8 cm    Just Proximal to Ulnar Styloid Process 16.5 cm    Across Hand at [employer] 19 cm    At [place] of 2nd Digit 6 cm      Left Upper Extremity Lymphedema   10 cm Proximal to Olecranon Process 35.4 cm    Olecranon Process 26.9 cm    10 cm Proximal to Ulnar Styloid Process 24.5 cm    Just Proximal to Ulnar Styloid Process 16.6 cm    Across Hand at [employer] 18.5 cm    At [place] of 2nd Digit 5.8 cm             L-DEX FLOWSHEETS - 03/27/21 2000       L-DEX LYMPHEDEMA SCREENING   Measurement Type Unilateral    L-DEX MEASUREMENT EXTREMITY Upper Extremity    POSITION  Standing    DOMINANT SIDE Right    At Risk Side Right    BASELINE SCORE (UNILATERAL) -5.3             The patient was assessed using the L-Dex machine today to produce a lymphedema index baseline  score. The patient will be reassessed on a regular basis (typically every 3 months) to obtain new L-Dex scores. If the score is > 6.5 points away from his/her baseline score indicating onset of subclinical lymphedema, it will be recommended to wear a compression garment for 4 weeks, 12 hours per day and then be reassessed. If the score continues to be > 6.5 points from baseline at reassessment, we will initiate lymphedema treatment. Assessing in this manner has a 95% rate of preventing clinically significant lymphedema.      Katina Dung - 03/27/21 0001     Open a tight or new jar Severe difficulty    Do heavy household chores (wash walls, wash floors) Mild difficulty    Carry a shopping bag or briefcase No difficulty    Wash your back No difficulty    Use a knife to cut food Mild difficulty    Recreational activities in which you take some force or impact through your arm, shoulder, or hand (golf, hammering, tennis) Moderate difficulty    During the past week, to what extent has your arm, shoulder or hand problem interfered with your normal social activities with family, friends, neighbors, or groups? Slightly    During the past week, to what extent has your arm, shoulder or hand problem limited your work or other regular daily activities Slightly    Arm, shoulder, or hand pain. Moderate    Tingling (pins and needles) in your arm, shoulder, or hand Mild    Difficulty Sleeping Mild difficulty    DASH Score 29.55 %              Objective measurements completed on examination: See above findings.  Patient was instructed today in a home exercise program today for post op shoulder range of motion. These included active assist shoulder flexion in sitting, scapular retraction, wall walking with shoulder abduction, and hands behind head external rotation.  She was encouraged to do these twice a day, holding 3 seconds and repeating 5 times when permitted by her  physician.          PT Education - 03/27/21 1958     Education Details Lymphedema risk reduction and post op HEP    Person(s) Educated Patient    Methods Explanation;Demonstration;Handout    Comprehension Returned demonstration;Verbalized understanding                 PT Long Term Goals - 03/27/21 1948       PT LONG TERM GOAL #1   Title Patient will demonstrate she has regained full shoulder ROM and function post operatively compared to baselines.    Time 8    Period Weeks    Status New    Target Date 05/22/21             Breast Clinic Goals - 03/27/21 2019       Patient will be able to verbalize understanding of pertinent lymphedema risk reduction practices relevant to her diagnosis specifically related to skin care.   Time 1    Period Days    Status Achieved      Patient will be able to return demonstrate and/or verbalize understanding of the post-op home exercise program related to regaining shoulder range of motion.   Time 1    Period Days    Status Achieved      Patient will be able to verbalize understanding of the importance of attending the postoperative After Breast Cancer Class for further lymphedema risk reduction education and therapeutic exercise.   Time 1    Period Days    Status Achieved                   Plan - 03/27/21 1949     Clinical Impression Statement Patient was diagnosed on 02/11/2021 with right grade I invasive ductal carcinoma breast cancer. It measures 9 mm and is lcoated in the upper outer quadrant. It is ER/PR positive and HER2 negative with a Ki67 of 5%. She has a history of bilateral total knee replacements in 2007 (left) and 2017 (right). Her multidisciplinary medical team met prior to her assessments to determine a recommended treatment plan. She is planning to have a right lumpectomy and sentinel node biopsy followed by Oncotype testing, radiation, and anti-estrogen therapy. She will benefit from a post op PT  reassessment to determine needs and from L-Dex screens every 3 months for 2 years to detect subclinical lymphedema.    Stability/Clinical Decision Making Stable/Uncomplicated    Clinical Decision Making Low    Rehab Potential Excellent    PT Frequency --   Eval and 1 f/u visit   PT Treatment/Interventions ADLs/Self Care Home Management;Therapeutic exercise;Patient/family education    PT Next Visit Plan Will reassess 3-4 weeks post op to determine needs    PT Home Exercise Plan Post op shoulder ROM HEP    Consulted and Agree with Plan of Care Patient             Patient will benefit from skilled therapeutic intervention in order to improve the following deficits and impairments:  Postural dysfunction, Decreased range of motion, Decreased knowledge of precautions, Impaired UE functional use, Pain  Visit  Diagnosis: Malignant neoplasm of upper-outer quadrant of right breast in female, estrogen receptor positive ([place]) - Plan: PT plan of care cert/re-cert  Abnormal posture - Plan: PT plan of care cert/re-cert  Patient will follow up at outpatient cancer rehab 3-4 weeks following surgery.  If the patient requires physical therapy at that time, a specific plan will be dictated and sent to the referring physician for approval. The patient was educated today on appropriate basic range of motion exercises to begin post operatively and the importance of attending the After Breast Cancer class following surgery.  Patient was educated today on lymphedema risk reduction practices as it pertains to recommendations that will benefit the patient immediately following surgery.  She verbalized good understanding.      Problem List Patient Active Problem List   Diagnosis Date Noted   Malignant neoplasm of upper-outer quadrant of right breast in female, estrogen receptor positive ([place]) 03/26/2021   S/P repair of paraesophageal hernia 12/17/2020   Anxiety 11/02/2019   B12 deficiency 11/02/2019    Adjustment disorder with mixed anxiety and depressed mood 08/22/2019   Iron deficiency anemia 08/22/2019   Gastroesophageal reflux disease 08/22/2019   Chronic joint pain 08/22/2019   Stage 3 chronic kidney disease ([place]) 08/22/2019   Obesity (BMI 35.0-39.9 without comorbidity) 02/28/2019   Normocytic anemia, not due to blood loss 02/28/2019   Restless legs syndrome (RLS) 06/30/2018   Arthrosis of first carpometacarpal joint 02/11/2018   Primary osteoarthritis of first carpometacarpal joint of left hand    Pain in left hand 12/23/2017   Healthcare maintenance 11/16/2017   Tinea versicolor 11/16/2017   Chronic right shoulder pain 09/02/2017   Fibromyalgia 08/11/2017   Family history of diabetes mellitus 06/23/2016   Other fatigue 06/23/2016   Insomnia 06/23/2016   Rheumatoid arthritis ([place]) 06/23/2016   Status post right partial knee replacement 09/15/2014   Status post total knee replacement using cement 06/08/2014   Screening for colon cancer    Schatzki's ring    Hiatal hernia    Dysphagia 05/26/2014   Encounter for screening colonoscopy 05/26/2014   Adrenal adenoma 05/01/2014   Breast lump 05/01/2014   Metatarsalgia of both feet 01/12/2014   Bilateral leg pain 01/11/2014   Bilateral edema of lower extremity 01/11/2014   Other osteoarthritis of spine, thoracolumbar region 12/26/2013   Hypercholesteremia 05/19/2013   Periodic health assessment, general screening, adult 04/13/2013   GERD (gastroesophageal reflux disease) 04/13/2013   Essential hypertension 04/13/2013   Dyspnea 04/13/2013   Annia Friendly, PT 03/27/21 8:22 PM   [facility] @ [address] [place], [place], [address] Phone: [phone]   Fax:  [phone]  Name: TAYLAH DUBIEL MRN: [id number] Date of Birth: [birth year]

## 2021-03-27 NOTE — Progress Notes (Signed)
REFERRING PROVIDER: Nicholas Lose, MD [place], [place] [address]  PRIMARY PROVIDER:  Lorrene Reid, PA-C  PRIMARY REASON FOR VISIT:  Encounter Diagnoses  Name Primary?   Malignant neoplasm of upper-outer quadrant of right breast in female, estrogen receptor positive ([place]) Yes   Family history of breast cancer    Family history of prostate cancer     HISTORY OF PRESENT ILLNESS:   [name], a 60 y.o. female, was seen for a [place] cancer genetics consultation during the breast multidisciplinary clinic at the request of Dr. Lindi Adie due to a personal and family history of cancer.  [name] presents to clinic today to discuss the possibility of a hereditary predisposition to cancer, to discuss genetic testing, and to further clarify her future cancer risks, as well as potential cancer risks for family members.   In November 2022, at the age of 32, [name] was diagnosed with invasive ductal carcinoma of the right breast. The treatment plan is pending based on her genetic testing results.   CANCER HISTORY:  Oncology History  Malignant neoplasm of upper-outer quadrant of right breast in female, estrogen receptor positive ([place])  03/18/2021 Initial Diagnosis   Screening mammogram: a possible mass and distortion in the right breast, Diagnostic mammogram: highly suspicious 0.9 cm upper outer right breast mass. Biopsy: Grade 1 IDC with DCIS ER 100%, PR 1%, HER2 negative by [facility]   03/27/2021 Cancer Staging   Staging form: Breast, AJCC 8th Edition - Clinical stage from 03/27/2021: Stage IA (cT1b, cN0, cM0, G1, ER+, PR+, HER2-) - Signed by Nicholas Lose, MD on 03/27/2021 Stage prefix: Initial diagnosis Histologic grading system: 3 grade system      Past Medical History:  Diagnosis Date   Acid reflux    takes Zantac and Omeprazole daily   Anemia    Anxiety    takes Citaopram daily   Arthritis    right knee   Arthrosis    left thumb CMC   Chronic back pain     DDD   Clotting disorder (HCC)    hx of blood clot following knee scope   Depression    Fibromyalgia    History of bronchitis 3+yrs ago   Hyperlipidemia    takes Atorvastatin daily   Hypertension    takes Lisinopril and HCTZ daily   Insomnia    takes Elavil nightly as needed   Paraesophageal hernia     Past Surgical History:  Procedure Laterality Date   BREAST BIOPSY Left    BUNIONECTOMY Right 02/18/2018   Procedure: Yehuda Budd;  Surgeon: Edrick Kins, DPM;  Location: [place];  Service: Podiatry;  Laterality: Right;   CAPSULOTOMY Bilateral 02/18/2018   Procedure: CAPSULOTOMY MPJ RELEASE JOINT 2N BILATERAL;  Surgeon: Edrick Kins, DPM;  Location: [place];  Service: Podiatry;  Laterality: Bilateral;   CARPOMETACARPEL SUSPENSION PLASTY Left 01/27/2018   Procedure: LEFT THUMB ligament reconstruction and tendon interposition;  Surgeon: Leandrew Koyanagi, MD;  Location: [place];  Service: Orthopedics;  Laterality: Left;   CARPOMETACARPEL SUSPENSION PLASTY Left 03/07/2020   Procedure: REVISION LEFT THUMB CARPOMETACARPAL ([place]) ARTHROPLASTY;  Surgeon: Leandrew Koyanagi, MD;  Location: [place];  Service: Orthopedics;  Laterality: Left;   CHONDROPLASTY Right 06/28/2014   Procedure: CHONDROPLASTY;  Surgeon: Marianna Payment, MD;  Location: [place];  Service: Orthopedics;  Laterality: Right;   COLONOSCOPY N/A 05/29/2014   Procedure: COLONOSCOPY;  Surgeon: Daneil Dolin, MD;  [facility];  Service: Endoscopy;  Laterality: N/A;  215pm- Pt is working until 12:00 so she can't come any earlier   ESOPHAGOGASTRODUODENOSCOPY N/A 05/29/2014   Procedure: ESOPHAGOGASTRODUODENOSCOPY (EGD);  Surgeon: Daneil Dolin, MD;  [facility];  Service: Endoscopy;  Laterality: N/A;   ESOPHAGOGASTRODUODENOSCOPY N/A 12/17/2020   Procedure: ESOPHAGOGASTRODUODENOSCOPY (EGD);  Surgeon: Lajuana Matte, MD;  Location: [place];  Service:  Thoracic;  Laterality: N/A;   GANGLION CYST EXCISION Left 01/13/2002   HAMMER TOE SURGERY Bilateral 02/18/2018   Procedure: HAMMER TOE CORRECTION2ND BILATERAL;  Surgeon: Edrick Kins, DPM;  Location: [place];  Service: Podiatry;  Laterality: Bilateral;   HERNIA REPAIR     KNEE ARTHROSCOPY WITH MEDIAL MENISECTOMY Right 06/28/2014   Procedure: RIGHT KNEE ARTHROSCOPY WITH PARTIAL MEDIAL MENISCECTOMY AND CHONDROPLASTY;  Surgeon: Marianna Payment, MD;  Location: [place];  Service: Orthopedics;  Laterality: Right;   MALONEY DILATION N/A 05/29/2014   Procedure: Venia Minks DILATION;  Surgeon: Daneil Dolin, MD;  [facility];  Service: Endoscopy;  Laterality: N/A;   PARTIAL KNEE ARTHROPLASTY Right 09/15/2014   Procedure: RIGHT UNICOMPARTMENTAL KNEE ARTHROPLASTY;  Surgeon: Leandrew Koyanagi, MD;  Location: [place];  Service: Orthopedics;  Laterality: Right;   SHOULDER ARTHROSCOPY Right    TOTAL KNEE ARTHROPLASTY Left 04/08/2004   XI ROBOTIC ASSISTED PARAESOPHAGEAL HERNIA REPAIR N/A 12/17/2020   Procedure: XI ROBOTIC ASSISTED Laparoscopy PARAESOPHAGEAL HERNIA REPAIR WITH FUNDOPLICATION;  Surgeon: Lajuana Matte, MD;  [facility];  Service: Thoracic;  Laterality: N/A;    Social History   Socioeconomic History   Marital status: Divorced    Spouse name: Not on file   Number of children: Not on file   Years of education: Not on file   Highest education level: Not on file  Occupational History   Occupation: Scientist, water quality  Tobacco Use   Smoking status: Never   Smokeless tobacco: Never  Vaping Use   Vaping Use: Never used  Substance and Sexual Activity   Alcohol use: No    Alcohol/week: 0.0 standard drinks   Drug use: No   Sexual activity: Not Currently    Birth control/protection: None, Post-menopausal  Other Topics Concern   Not on file  Social History Narrative   Not on file   Social Determinants of Health   Financial Resource Strain: Not on file  Food  Insecurity: Not on file  Transportation Needs: Not on file  Physical Activity: Not on file  Stress: Not on file  Social Connections: Not on file     FAMILY HISTORY:  We obtained a detailed, 4-generation family history.  Significant diagnoses are listed below: Family History  Problem Relation Age of Onset   Lung cancer Father 86   Prostate cancer Father    Breast cancer Sister 65   Lung cancer Maternal Grandfather    Lung cancer Paternal Grandfather      [name]'s sister was diagnosed with stage IV breast cancer at 11. Her maternal grandfather was diagnosed with lung cancer and is deceased. [name] father was diagnosed with prostate cancer and had a prostatectomy and chemotherapy as his treatment. He was also diagnosed with lung cancer and died at 76. Her paternal grandfather was diagnosed with lung cancer and is deceased.   [name] is unaware of previous family history of genetic testing for hereditary cancer risks. There is no reported Ashkenazi Jewish ancestry.   GENETIC COUNSELING ASSESSMENT: [name] is a 60  y.o. female with a personal and family history of cancer which is somewhat suggestive of a hereditary cancer syndrome and predisposition to cancer. We, therefore, discussed and recommended the following at today's visit.   DISCUSSION: We discussed that 5 - 10% of cancer is hereditary, with most cases of hereditary breast cancer associated with mutations in BRCA1/2.  There are other genes that can be associated with hereditary breast cancer syndromes. Type of cancer risk and level of risk are gene-specific. We discussed that testing is beneficial for several reasons including knowing how to follow individuals after completing their treatment, identifying whether potential treatment options would be beneficial, and understanding if other family members could be at risk for cancer and allowing them to undergo genetic testing.   We reviewed the characteristics, features and  inheritance patterns of hereditary cancer syndromes. We also discussed genetic testing, including the appropriate family members to test, the process of testing, insurance coverage and turn-around-time for results. We discussed the implications of a negative, positive and/or variant of uncertain significant result. In order to get genetic test results in a timely manner so that [name] can use these genetic test results for surgical decisions, we recommended [name] pursue genetic testing for the [facility] panel. Once complete, we recommend [name] pursue reflex genetic testing to a more comprehensive gene panel.   [name]  was offered a common hereditary cancer panel (47 genes) and an expanded pan-cancer panel (77 genes). [name] was informed of the benefits and limitations of each panel, including that expanded pan-cancer panels contain genes that do not have clear management guidelines at this point in time.  We also discussed that as the number of genes included on a panel increases, the chances of variants of uncertain significance increases.  After considering the benefits and limitations of each gene panel, [name]  elected to have [facility] CustomNext Panel+RNA.  The CustomNext-Cancer+RNAinsight panel offered by [facility] includes sequencing and rearrangement analysis for the following 47 genes:  APC, ATM, AXIN2, BARD1, BMPR1A, BRCA1, BRCA2, BRIP1, CDH1, CDK4, CDKN2A, CHEK2, DICER1, EPCAM, GREM1, HOXB13, MEN1, MLH1, MSH2, MSH3, MSH6, MUTYH, NBN, NF1, NF2, NTHL1, PALB2, PMS2, POLD1, POLE, PTEN, RAD51C, RAD51D, RECQL, RET, SDHA, SDHAF2, SDHB, SDHC, SDHD, SMAD4, SMARCA4, STK11, TP53, TSC1, TSC2, and VHL.  RNA data is routinely analyzed for use in variant interpretation for all genes.  Based on Ms. Silber's personal and family history of cancer, she meets medical criteria for genetic testing. Despite that she meets criteria, she may still have an out of pocket cost. We discussed that if her out of  pocket cost for testing is over $100, the laboratory should contact them to discuss self-pay prices, patient pay assistance programs, if applicable, and other billing options.   PLAN: After considering the risks, benefits, and limitations, Ms. Meinzer provided informed consent to pursue genetic testing and the blood sample was sent to [facility] for analysis of the CustomNext Panel (47 genes). Results should be available within approximately 1-2 weeks' time, at which point they will be disclosed by telephone to Ms. Imber, as will any additional recommendations warranted by these results. Ms. Wentzel will receive a summary of her genetic counseling visit and a copy of her results once available. This information will also be available in Epic.   Ms. Tsutsui questions were answered to her satisfaction today. Our contact information was provided should additional questions or concerns arise. Thank you for the referral and allowing [place] to share in the care  of your patient.   Lucille Passy, MS, [facility] Genetic Counselor [email or web address] (P) [phone]  The patient was seen for a total of 20 minutes in face-to-face genetic counseling.  The patient brought her daughter. Drs. Magrinat, Lindi Adie and/or Burr Medico were available to discuss this case as needed.  _______________________________________________________________________ For Office Staff:  Number of people involved in session: 2 Was an Intern/ student involved with case: no

## 2021-03-27 NOTE — Progress Notes (Signed)
Radiation Oncology         [phone] ________________________________  Name: [name]        MRN: [id number]  Date of Service: 03/27/2021 DOB: 05/30/1960  CC:Abonza, Maritza, PA-C  Toth, Paul III, MD     REFERRING PHYSICIAN: Toth, Paul III, MD   DIAGNOSIS: The encounter diagnosis was Malignant neoplasm of upper-outer quadrant of right breast in female, estrogen receptor positive (HCC).   HISTORY OF PRESENT ILLNESS: [name] is a 59 y.o. female seen in the multidisciplinary breast clinic for a new diagnosis of right breast cancer. The patient was noted to have screening detected mass during a work up of a paraesophageal hernia that she ended up having surgical repair of. Further diagnostic imaging showed mass and distortion in the right breast.  Further diagnostic work-up identified a 9 mm mass in the 10 o'clock position, her right axilla was negative for adenopathy.  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  She is seen today to discuss treatment recommendations for her cancer.    PREVIOUS RADIATION THERAPY: No   PAST MEDICAL HISTORY:  Past Medical History:  Diagnosis Date   Acid reflux    takes Zantac and Omeprazole daily   Anemia    Anxiety    takes Citaopram daily   Arthritis    right knee   Arthrosis    left thumb CMC   Chronic back pain    DDD   Clotting disorder (HCC)    hx of blood clot following knee scope   Depression    Fibromyalgia    History of bronchitis 3+yrs ago   Hyperlipidemia    takes Atorvastatin daily   Hypertension    takes Lisinopril and HCTZ daily   Insomnia    takes Elavil nightly as needed   Paraesophageal hernia        PAST SURGICAL HISTORY: Past Surgical History:  Procedure Laterality Date   BREAST BIOPSY Left    BUNIONECTOMY Right 02/18/2018   Procedure: AUSTION BUNIONECTOMY;  Surgeon: Evans, Brent M, DPM;  [facility];  Service: Podiatry;  Laterality: Right;   CAPSULOTOMY Bilateral 02/18/2018   Procedure: CAPSULOTOMY MPJ RELEASE JOINT 2N BILATERAL;  Surgeon: Evans, Brent M, DPM;  [facility];  Service: Podiatry;  Laterality: Bilateral;   CARPOMETACARPEL SUSPENSION PLASTY Left 01/27/2018   Procedure: LEFT THUMB ligament reconstruction and tendon interposition;  Surgeon: Xu, Naiping M, MD;  Location: [facility];  Service: Orthopedics;  Laterality: Left;   CARPOMETACARPEL SUSPENSION PLASTY Left 03/07/2020   Procedure: REVISION LEFT THUMB CARPOMETACARPAL (CMC) ARTHROPLASTY;  Surgeon: Xu, Naiping M, MD;  Location: [facility];  Service: Orthopedics;  Laterality: Left;   CHONDROPLASTY Right 06/28/2014   Procedure: CHONDROPLASTY;  Surgeon: Naiping Michael Xu, MD;  Location: [facility];  Service: Orthopedics;  Laterality: Right;   COLONOSCOPY N/A 05/29/2014   Procedure: COLONOSCOPY;  Surgeon: Robert M Rourk, MD;  [facility];  Service: Endoscopy;  Laterality: N/A;  215pm- Pt is working until 12:00 so she can't come any earlier   ESOPHAGOGASTRODUODENOSCOPY N/A 05/29/2014   Procedure: ESOPHAGOGASTRODUODENOSCOPY (EGD);  Surgeon: Robert M Rourk, MD;  [facility];  Service: Endoscopy;  Laterality: N/A;   ESOPHAGOGASTRODUODENOSCOPY N/A 12/17/2020   Procedure: ESOPHAGOGASTRODUODENOSCOPY (EGD);  Surgeon: Lightfoot, Harrell O, MD;  [facility];  Service: Thoracic;  Laterality: N/A;     Radiation Oncology         [phone] ________________________________  Name: [name]        MRN: [id number]  Date of Service: 03/27/2021 DOB: [birth year]  DX:AJOINO, [name], PA-C  Jovita Kussmaul, MD     REFERRING PHYSICIAN: [name], MD   DIAGNOSIS: The encounter diagnosis was Malignant neoplasm of upper-outer quadrant of right breast in female, estrogen receptor positive ([place]).   HISTORY OF PRESENT ILLNESS: [name] is a 60 y.o. female seen in the multidisciplinary breast clinic for a new diagnosis of right breast cancer. The patient was noted to have screening detected mass during a work up of a paraesophageal hernia that she ended up having surgical repair of. Further diagnostic imaging showed mass and distortion in the right breast.  Further diagnostic work-up identified a 9 mm mass in the 10 o'clock position, her right axilla was negative for adenopathy.  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  She is seen today to discuss treatment recommendations for her cancer.    PREVIOUS RADIATION THERAPY: No   PAST MEDICAL HISTORY:  Past Medical History:  Diagnosis Date   Acid reflux    takes Zantac and Omeprazole daily   Anemia    Anxiety    takes Citaopram daily   Arthritis    right knee   Arthrosis    left thumb CMC   Chronic back pain    DDD   Clotting disorder (HCC)    hx of blood clot following knee scope   Depression    Fibromyalgia    History of bronchitis 3+yrs ago   Hyperlipidemia    takes Atorvastatin daily   Hypertension    takes Lisinopril and HCTZ daily   Insomnia    takes Elavil nightly as needed   Paraesophageal hernia        PAST SURGICAL HISTORY: Past Surgical History:  Procedure Laterality Date   BREAST BIOPSY Left    BUNIONECTOMY Right 02/18/2018   Procedure: Yehuda Budd;  Surgeon: Edrick Kins, DPM;  Location:  [place];  Service: Podiatry;  Laterality: Right;   CAPSULOTOMY Bilateral 02/18/2018   Procedure: CAPSULOTOMY MPJ RELEASE JOINT 2N BILATERAL;  Surgeon: Edrick Kins, DPM;  Location: [place];  Service: Podiatry;  Laterality: Bilateral;   CARPOMETACARPEL SUSPENSION PLASTY Left 01/27/2018   Procedure: LEFT THUMB ligament reconstruction and tendon interposition;  Surgeon: Leandrew Koyanagi, MD;  Location: [place];  Service: Orthopedics;  Laterality: Left;   CARPOMETACARPEL SUSPENSION PLASTY Left 03/07/2020   Procedure: REVISION LEFT THUMB CARPOMETACARPAL ([facility]) ARTHROPLASTY;  Surgeon: Leandrew Koyanagi, MD;  Location: [place];  Service: Orthopedics;  Laterality: Left;   CHONDROPLASTY Right 06/28/2014   Procedure: CHONDROPLASTY;  Surgeon: Marianna Payment, MD;  Location: [place];  Service: Orthopedics;  Laterality: Right;   COLONOSCOPY N/A 05/29/2014   Procedure: COLONOSCOPY;  Surgeon: Daneil Dolin, MD;  [facility];  Service: Endoscopy;  Laterality: N/A;  215pm- Pt is working until 12:00 so she can't come any earlier   ESOPHAGOGASTRODUODENOSCOPY N/A 05/29/2014   Procedure: ESOPHAGOGASTRODUODENOSCOPY (EGD);  Surgeon: Daneil Dolin, MD;  [facility];  Service: Endoscopy;  Laterality: N/A;   ESOPHAGOGASTRODUODENOSCOPY N/A 12/17/2020   Procedure: ESOPHAGOGASTRODUODENOSCOPY (EGD);  Surgeon: Lajuana Matte, MD;  Location: [place];  Service: Thoracic;  Laterality: N/A;  Radiation Oncology         [phone] ________________________________  Name: [name]        MRN: [id number]  Date of Service: 03/27/2021 DOB: [birth year]  DX:AJOINO, [name], PA-C  Jovita Kussmaul, MD     REFERRING PHYSICIAN: [name], MD   DIAGNOSIS: The encounter diagnosis was Malignant neoplasm of upper-outer quadrant of right breast in female, estrogen receptor positive ([place]).   HISTORY OF PRESENT ILLNESS: [name] is a 60 y.o. female seen in the multidisciplinary breast clinic for a new diagnosis of right breast cancer. The patient was noted to have screening detected mass during a work up of a paraesophageal hernia that she ended up having surgical repair of. Further diagnostic imaging showed mass and distortion in the right breast.  Further diagnostic work-up identified a 9 mm mass in the 10 o'clock position, her right axilla was negative for adenopathy.  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  She is seen today to discuss treatment recommendations for her cancer.    PREVIOUS RADIATION THERAPY: No   PAST MEDICAL HISTORY:  Past Medical History:  Diagnosis Date   Acid reflux    takes Zantac and Omeprazole daily   Anemia    Anxiety    takes Citaopram daily   Arthritis    right knee   Arthrosis    left thumb CMC   Chronic back pain    DDD   Clotting disorder (HCC)    hx of blood clot following knee scope   Depression    Fibromyalgia    History of bronchitis 3+yrs ago   Hyperlipidemia    takes Atorvastatin daily   Hypertension    takes Lisinopril and HCTZ daily   Insomnia    takes Elavil nightly as needed   Paraesophageal hernia        PAST SURGICAL HISTORY: Past Surgical History:  Procedure Laterality Date   BREAST BIOPSY Left    BUNIONECTOMY Right 02/18/2018   Procedure: Yehuda Budd;  Surgeon: Edrick Kins, DPM;  Location:  [place];  Service: Podiatry;  Laterality: Right;   CAPSULOTOMY Bilateral 02/18/2018   Procedure: CAPSULOTOMY MPJ RELEASE JOINT 2N BILATERAL;  Surgeon: Edrick Kins, DPM;  Location: [place];  Service: Podiatry;  Laterality: Bilateral;   CARPOMETACARPEL SUSPENSION PLASTY Left 01/27/2018   Procedure: LEFT THUMB ligament reconstruction and tendon interposition;  Surgeon: Leandrew Koyanagi, MD;  Location: [place];  Service: Orthopedics;  Laterality: Left;   CARPOMETACARPEL SUSPENSION PLASTY Left 03/07/2020   Procedure: REVISION LEFT THUMB CARPOMETACARPAL ([facility]) ARTHROPLASTY;  Surgeon: Leandrew Koyanagi, MD;  Location: [place];  Service: Orthopedics;  Laterality: Left;   CHONDROPLASTY Right 06/28/2014   Procedure: CHONDROPLASTY;  Surgeon: Marianna Payment, MD;  Location: [place];  Service: Orthopedics;  Laterality: Right;   COLONOSCOPY N/A 05/29/2014   Procedure: COLONOSCOPY;  Surgeon: Daneil Dolin, MD;  [facility];  Service: Endoscopy;  Laterality: N/A;  215pm- Pt is working until 12:00 so she can't come any earlier   ESOPHAGOGASTRODUODENOSCOPY N/A 05/29/2014   Procedure: ESOPHAGOGASTRODUODENOSCOPY (EGD);  Surgeon: Daneil Dolin, MD;  [facility];  Service: Endoscopy;  Laterality: N/A;   ESOPHAGOGASTRODUODENOSCOPY N/A 12/17/2020   Procedure: ESOPHAGOGASTRODUODENOSCOPY (EGD);  Surgeon: Lajuana Matte, MD;  Location: [place];  Service: Thoracic;  Laterality: N/A;  Radiation Oncology         [phone] ________________________________  Name: [name]        MRN: [id number]  Date of Service: 03/27/2021 DOB: [birth year]  DX:AJOINO, [name], PA-C  Jovita Kussmaul, MD     REFERRING PHYSICIAN: [name], MD   DIAGNOSIS: The encounter diagnosis was Malignant neoplasm of upper-outer quadrant of right breast in female, estrogen receptor positive ([place]).   HISTORY OF PRESENT ILLNESS: [name] is a 60 y.o. female seen in the multidisciplinary breast clinic for a new diagnosis of right breast cancer. The patient was noted to have screening detected mass during a work up of a paraesophageal hernia that she ended up having surgical repair of. Further diagnostic imaging showed mass and distortion in the right breast.  Further diagnostic work-up identified a 9 mm mass in the 10 o'clock position, her right axilla was negative for adenopathy.  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  She is seen today to discuss treatment recommendations for her cancer.    PREVIOUS RADIATION THERAPY: No   PAST MEDICAL HISTORY:  Past Medical History:  Diagnosis Date   Acid reflux    takes Zantac and Omeprazole daily   Anemia    Anxiety    takes Citaopram daily   Arthritis    right knee   Arthrosis    left thumb CMC   Chronic back pain    DDD   Clotting disorder (HCC)    hx of blood clot following knee scope   Depression    Fibromyalgia    History of bronchitis 3+yrs ago   Hyperlipidemia    takes Atorvastatin daily   Hypertension    takes Lisinopril and HCTZ daily   Insomnia    takes Elavil nightly as needed   Paraesophageal hernia        PAST SURGICAL HISTORY: Past Surgical History:  Procedure Laterality Date   BREAST BIOPSY Left    BUNIONECTOMY Right 02/18/2018   Procedure: Yehuda Budd;  Surgeon: Edrick Kins, DPM;  Location:  [place];  Service: Podiatry;  Laterality: Right;   CAPSULOTOMY Bilateral 02/18/2018   Procedure: CAPSULOTOMY MPJ RELEASE JOINT 2N BILATERAL;  Surgeon: Edrick Kins, DPM;  Location: [place];  Service: Podiatry;  Laterality: Bilateral;   CARPOMETACARPEL SUSPENSION PLASTY Left 01/27/2018   Procedure: LEFT THUMB ligament reconstruction and tendon interposition;  Surgeon: Leandrew Koyanagi, MD;  Location: [place];  Service: Orthopedics;  Laterality: Left;   CARPOMETACARPEL SUSPENSION PLASTY Left 03/07/2020   Procedure: REVISION LEFT THUMB CARPOMETACARPAL ([facility]) ARTHROPLASTY;  Surgeon: Leandrew Koyanagi, MD;  Location: [place];  Service: Orthopedics;  Laterality: Left;   CHONDROPLASTY Right 06/28/2014   Procedure: CHONDROPLASTY;  Surgeon: Marianna Payment, MD;  Location: [place];  Service: Orthopedics;  Laterality: Right;   COLONOSCOPY N/A 05/29/2014   Procedure: COLONOSCOPY;  Surgeon: Daneil Dolin, MD;  [facility];  Service: Endoscopy;  Laterality: N/A;  215pm- Pt is working until 12:00 so she can't come any earlier   ESOPHAGOGASTRODUODENOSCOPY N/A 05/29/2014   Procedure: ESOPHAGOGASTRODUODENOSCOPY (EGD);  Surgeon: Daneil Dolin, MD;  [facility];  Service: Endoscopy;  Laterality: N/A;   ESOPHAGOGASTRODUODENOSCOPY N/A 12/17/2020   Procedure: ESOPHAGOGASTRODUODENOSCOPY (EGD);  Surgeon: Lajuana Matte, MD;  Location: [place];  Service: Thoracic;  Laterality: N/A;  Radiation Oncology         [phone] ________________________________  Name: [name]        MRN: [id number]  Date of Service: 03/27/2021 DOB: 05/30/1960  CC:Abonza, Maritza, PA-C  Toth, Paul III, MD     REFERRING PHYSICIAN: Toth, Paul III, MD   DIAGNOSIS: The encounter diagnosis was Malignant neoplasm of upper-outer quadrant of right breast in female, estrogen receptor positive (HCC).   HISTORY OF PRESENT ILLNESS: [name] is a 59 y.o. female seen in the multidisciplinary breast clinic for a new diagnosis of right breast cancer. The patient was noted to have screening detected mass during a work up of a paraesophageal hernia that she ended up having surgical repair of. Further diagnostic imaging showed mass and distortion in the right breast.  Further diagnostic work-up identified a 9 mm mass in the 10 o'clock position, her right axilla was negative for adenopathy.  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  She is seen today to discuss treatment recommendations for her cancer.    PREVIOUS RADIATION THERAPY: No   PAST MEDICAL HISTORY:  Past Medical History:  Diagnosis Date   Acid reflux    takes Zantac and Omeprazole daily   Anemia    Anxiety    takes Citaopram daily   Arthritis    right knee   Arthrosis    left thumb CMC   Chronic back pain    DDD   Clotting disorder (HCC)    hx of blood clot following knee scope   Depression    Fibromyalgia    History of bronchitis 3+yrs ago   Hyperlipidemia    takes Atorvastatin daily   Hypertension    takes Lisinopril and HCTZ daily   Insomnia    takes Elavil nightly as needed   Paraesophageal hernia        PAST SURGICAL HISTORY: Past Surgical History:  Procedure Laterality Date   BREAST BIOPSY Left    BUNIONECTOMY Right 02/18/2018   Procedure: AUSTION BUNIONECTOMY;  Surgeon: Evans, Brent M, DPM;  [facility];  Service: Podiatry;  Laterality: Right;   CAPSULOTOMY Bilateral 02/18/2018   Procedure: CAPSULOTOMY MPJ RELEASE JOINT 2N BILATERAL;  Surgeon: Evans, Brent M, DPM;  [facility];  Service: Podiatry;  Laterality: Bilateral;   CARPOMETACARPEL SUSPENSION PLASTY Left 01/27/2018   Procedure: LEFT THUMB ligament reconstruction and tendon interposition;  Surgeon: Xu, Naiping M, MD;  Location: [facility];  Service: Orthopedics;  Laterality: Left;   CARPOMETACARPEL SUSPENSION PLASTY Left 03/07/2020   Procedure: REVISION LEFT THUMB CARPOMETACARPAL (CMC) ARTHROPLASTY;  Surgeon: Xu, Naiping M, MD;  Location: [facility];  Service: Orthopedics;  Laterality: Left;   CHONDROPLASTY Right 06/28/2014   Procedure: CHONDROPLASTY;  Surgeon: Naiping Michael Xu, MD;  Location: [facility];  Service: Orthopedics;  Laterality: Right;   COLONOSCOPY N/A 05/29/2014   Procedure: COLONOSCOPY;  Surgeon: Robert M Rourk, MD;  [facility];  Service: Endoscopy;  Laterality: N/A;  215pm- Pt is working until 12:00 so she can't come any earlier   ESOPHAGOGASTRODUODENOSCOPY N/A 05/29/2014   Procedure: ESOPHAGOGASTRODUODENOSCOPY (EGD);  Surgeon: Robert M Rourk, MD;  [facility];  Service: Endoscopy;  Laterality: N/A;   ESOPHAGOGASTRODUODENOSCOPY N/A 12/17/2020   Procedure: ESOPHAGOGASTRODUODENOSCOPY (EGD);  Surgeon: Lightfoot, Harrell O, MD;  [facility];  Service: Thoracic;  Laterality: N/A;  

## 2021-03-27 NOTE — Patient Instructions (Signed)

## 2021-03-28 ENCOUNTER — Encounter: Payer: Self-pay | Admitting: Neurology

## 2021-03-28 ENCOUNTER — Ambulatory Visit (INDEPENDENT_AMBULATORY_CARE_PROVIDER_SITE_OTHER): Payer: Medicare Other | Admitting: Neurology

## 2021-03-28 ENCOUNTER — Encounter: Payer: Self-pay | Admitting: Genetic Counselor

## 2021-03-28 DIAGNOSIS — M79604 Pain in right leg: Secondary | ICD-10-CM

## 2021-03-28 DIAGNOSIS — M79605 Pain in left leg: Secondary | ICD-10-CM

## 2021-03-28 NOTE — Progress Notes (Signed)
[facility] Psychosocial Distress Screening Spiritual Care  Met with [name] and her daughter in Breast Multidisciplinary Clinic to introduce Support Center team/resources, reviewing distress screen per protocol.  The patient scored a 10 on the Psychosocial Distress Thermometer which indicates severe distress. Also assessed for distress and other psychosocial needs.   ONCBCN DISTRESS SCREENING 03/28/2021  Screening Type Initial Screening  Distress experienced in past week (1-10) 10  Emotional problem type Depression;Nervousness/Anxiety;Adjusting to illness;Isolation/feeling alone;Feeling hopeless;Adjusting to appearance changes  Information Concerns Type Lack of info about treatment;Lack of info about diagnosis;Lack of info about complementary therapy choices;Lack of info about maintaining fitness  Physical Problem type Pain;Nausea/vomiting;Sleep/insomnia;Breathing  Referral to support programs Yes   [name] reports high distress. She notes that she is anxious about genetic testing results in particular. She also notes that some of the emotional concerns she checked on her screen are ongoing issues; she has a counselor as an additional layer of support.  Follow up needed: Yes.  We plan a pastoral check-in by phone in two weeks, and she knows to contact Team in the meantime if needs arise or circumstances change.   Chaplain  , MDiv, [facility] Pager [phone] Voicemail [phone]       

## 2021-03-28 NOTE — Progress Notes (Signed)
[facility] Clinic Note - Initial Visit   Date: 03/28/21  [name] MRN: [id number] DOB: 12-19-1960   Dear Dr. Dimple Casey:  Thank you for your kind referral of [name] for consultation of bilateral leg pain. Although her history is well known to you, please allow [place] to reiterate it for the purpose of our medical record. The patient was accompanied to the clinic by self.    History of Present Illness: [name] is a 60 y.o. right-handed female with hypertension, GERD, fibromyalgia, depression, RA, and newly diagnosed right breast cancer presenting for evaluation of bilateral leg pain.   Starting around 2 years ago, she began having bilateral leg pain which is triggered by standing or walking. Pain is located in the lower legs, ankles, and feet, described as as "hurt".  She is unable to describe the pain further, such as sharp, dull, throbbing, achy, and says the pain is "all of it".  Sometimes, pain will shoot up to her legs. She has tingling and numbness.  Rest does not alleviate her pain. Once her pain is triggered, it will last all day and slowly self-resolve after a few days.  She has associated weakness, described more as buckling.  She walks unassisted and has not had any falls.    She is on disability due to chronic pain. She lives alone in a one-level home.    Out-side paper records, electronic medical record, and images have been reviewed where available and summarized as:  Lab Results  Component Value Date   HGBA1C 5.8 (H) 08/22/2019   Lab Results  Component Value Date   VITAMINB12 246 11/15/2019   Lab Results  Component Value Date   TSH 1.720 08/22/2019   Lab Results  Component Value Date   ESRSEDRATE 18 09/12/2015    Past Medical History:  Diagnosis Date   Acid reflux    takes Zantac and Omeprazole daily   Anemia    Anxiety    takes Citaopram daily   Arthritis    right knee   Arthrosis    left thumb CMC   Chronic back pain     DDD   Clotting disorder (HCC)    hx of blood clot following knee scope   Depression    Fibromyalgia    History of bronchitis 3+yrs ago   Hyperlipidemia    takes Atorvastatin daily   Hypertension    takes Lisinopril and HCTZ daily   Insomnia    takes Elavil nightly as needed   Paraesophageal hernia     Past Surgical History:  Procedure Laterality Date   BREAST BIOPSY Left    BUNIONECTOMY Right 02/18/2018   Procedure: Ivory Broad;  Surgeon: Felecia Shelling, DPM;  Location: [facility];  Service: Podiatry;  Laterality: Right;   CAPSULOTOMY Bilateral 02/18/2018   Procedure: CAPSULOTOMY MPJ RELEASE JOINT 2N BILATERAL;  Surgeon: Felecia Shelling, DPM;  Location: [facility];  Service: Podiatry;  Laterality: Bilateral;   CARPOMETACARPEL SUSPENSION PLASTY Left 01/27/2018   Procedure: LEFT THUMB ligament reconstruction and tendon interposition;  Surgeon: Tarry Kos, MD;  Location: [facility];  Service: Orthopedics;  Laterality: Left;   CARPOMETACARPEL SUSPENSION PLASTY Left 03/07/2020   Procedure: REVISION LEFT THUMB CARPOMETACARPAL (CMC) ARTHROPLASTY;  Surgeon: Tarry Kos, MD;  Location: [facility];  Service: Orthopedics;  Laterality: Left;   CHONDROPLASTY Right 06/28/2014   Procedure: CHONDROPLASTY;  Surgeon: Cheral Almas, MD;  Location: [facility];  Service: Orthopedics;  Laterality: Right;   COLONOSCOPY N/A 05/29/2014   Procedure: COLONOSCOPY;  Surgeon: Corbin Ade, MD;  [facility];  Service: Endoscopy;  Laterality: N/A;  215pm- Pt is working until 12:00 so she can't come any earlier   ESOPHAGOGASTRODUODENOSCOPY N/A 05/29/2014   Procedure: ESOPHAGOGASTRODUODENOSCOPY (EGD);  Surgeon: Corbin Ade, MD;  [facility];  Service: Endoscopy;  Laterality: N/A;   ESOPHAGOGASTRODUODENOSCOPY N/A 12/17/2020   Procedure: ESOPHAGOGASTRODUODENOSCOPY (EGD);  Surgeon: Corliss Skains, MD;  Location: [facility];  Service:  Thoracic;  Laterality: N/A;   GANGLION CYST EXCISION Left 01/13/2002   HAMMER TOE SURGERY Bilateral 02/18/2018   Procedure: HAMMER TOE CORRECTION2ND BILATERAL;  Surgeon: Felecia Shelling, DPM;  Location: [facility];  Service: Podiatry;  Laterality: Bilateral;   HERNIA REPAIR     KNEE ARTHROSCOPY WITH MEDIAL MENISECTOMY Right 06/28/2014   Procedure: RIGHT KNEE ARTHROSCOPY WITH PARTIAL MEDIAL MENISCECTOMY AND CHONDROPLASTY;  Surgeon: Cheral Almas, MD;  Location: [facility];  Service: Orthopedics;  Laterality: Right;   MALONEY DILATION N/A 05/29/2014   Procedure: [name] DILATION;  Surgeon: Corbin Ade, MD;  [facility];  Service: Endoscopy;  Laterality: N/A;   PARTIAL KNEE ARTHROPLASTY Right 09/15/2014   Procedure: RIGHT UNICOMPARTMENTAL KNEE ARTHROPLASTY;  Surgeon: Tarry Kos, MD;  [facility]: [facility];  Service: Orthopedics;  Laterality: Right;   SHOULDER ARTHROSCOPY Right    TOTAL KNEE ARTHROPLASTY Left 04/08/2004   XI ROBOTIC ASSISTED PARAESOPHAGEAL HERNIA REPAIR N/A 12/17/2020   Procedure: XI ROBOTIC ASSISTED Laparoscopy PARAESOPHAGEAL HERNIA REPAIR WITH FUNDOPLICATION;  Surgeon: Corliss Skains, MD;  Location: [facility];  Service: Thoracic;  Laterality: N/A;     Medications:  Outpatient Encounter Medications as of 03/28/2021  Medication Sig   FLUoxetine (PROZAC) 20 MG tablet Take 1.5 tablets (30 mg total) by mouth daily. Take 1.5 tablet by mouth daily.   hydrochlorothiazide (HYDRODIURIL) 50 MG tablet Take 1 tablet (50 mg total) by mouth daily.   lisinopril (ZESTRIL) 40 MG tablet Take 1 tablet (40 mg total) by mouth daily.   metoprolol succinate (TOPROL-XL) 25 MG 24 hr tablet Take 1 tablet by mouth once daily   [DISCONTINUED] albuterol (VENTOLIN HFA) 108 (90 Base) MCG/ACT inhaler Inhale 2 puffs into the lungs every 6 (six) hours as needed for wheezing or shortness of breath. (Patient not taking: Reported on 03/15/2021)   [DISCONTINUED] ciprofloxacin (CIPRO) 500  MG tablet Take 1 tablet (500 mg total) by mouth 2 (two) times daily. (Patient not taking: Reported on 03/15/2021)   [DISCONTINUED] metoCLOPramide (REGLAN) 5 MG tablet Take 1 tablet (5 mg total) by mouth 3 (three) times daily before meals. (Patient not taking: Reported on 03/15/2021)   [DISCONTINUED] metoprolol tartrate (LOPRESSOR) 25 MG tablet Take 0.5 tablets (12.5 mg total) by mouth 2 (two) times daily. (Patient not taking: Reported on 03/15/2021)   [DISCONTINUED] omeprazole (PRILOSEC) 20 MG capsule Take 1 capsule (20 mg total) by mouth daily. (Patient not taking: Reported on 03/28/2021)   [DISCONTINUED] oxyCODONE (ROXICODONE) 5 MG immediate release tablet Take 1 tablet (5 mg total) by mouth every 6 (six) hours as needed for severe pain. (Patient not taking: Reported on 03/15/2021)   No facility-administered encounter medications on file as of 03/28/2021.    Allergies:  Allergies  Allergen Reactions   Tramadol Itching    "bugs crawling all over"    Family History: Family History  Problem Relation Age of Onset   Stroke Mother    Lung  cancer Father 47   GI problems Father    Prostate cancer Father    Cancer Sister        breast   Breast cancer Sister 37   Lung cancer Maternal Grandfather    Lung cancer Paternal Grandfather    Diabetes Son    Colon cancer Neg Hx    Esophageal cancer Neg Hx    Pancreatic cancer Neg Hx     Social History: Social History   Tobacco Use   Smoking status: Never   Smokeless tobacco: Never  Vaping Use   Vaping Use: Never used  Substance Use Topics   Alcohol use: No    Alcohol/week: 0.0 standard drinks   Drug use: No   Social History   Social History Narrative   Right Handed    Lives in a one story home     Vital Signs:  BP 128/85   Pulse 68   Ht 5\' 6"  (1.676 m)   Wt 215 lb (97.5 kg)   LMP 02/07/2013   SpO2 98%   BMI 34.70 kg/m     Neurological Exam: MENTAL STATUS including orientation to time, place, person, recent and remote  memory, attention span and concentration, language, and fund of knowledge is normal.  Speech is not dysarthric.  CRANIAL NERVES: II:  No visual field defects.   III-IV-VI: Pupils equal round and reactive to light.  Normal conjugate, extra-ocular eye movements in all directions of gaze.  No nystagmus.  No ptosis.   V:  Normal facial sensation.    VII:  Normal facial symmetry and movements.   VIII:  Normal hearing and vestibular function.   IX-X:  Normal palatal movement.   XI:  Normal shoulder shrug and head rotation.   XII:  Normal tongue strength and range of motion, no deviation or fasciculation.  MOTOR:  No atrophy, fasciculations or abnormal movements.  No pronator drift.   Upper Extremity:  Right  Left  Deltoid  5/5   5/5   Biceps  5/5   5/5   Triceps  5/5   5/5   Infraspinatus 5/5  5/5  Medial pectoralis 5/5  5/5  Wrist extensors  5/5   5/5   Wrist flexors  5/5   5/5   Finger extensors  5/5   5/5   Finger flexors  5/5   5/5   Dorsal interossei  5/5   5/5   Abductor pollicis  5/5   5/5   Tone (Ashworth scale)  0  0   Lower Extremity:  Right  Left  Hip flexors  5/5   5/5   Hip extensors  5/5   5/5   Adductor 5/5  5/5  Abductor 5/5  5/5  Knee flexors  5/5   5/5   Knee extensors  5/5   5/5   Dorsiflexors  5/5   5/5   Plantarflexors  5/5   5/5   Toe extensors  5/5   5/5   Toe flexors  5/5   5/5   Tone (Ashworth scale)  0  0   MSRs:  Right        Left                  brachioradialis 2+  2+  biceps 2+  2+  triceps 2+  2+  patellar 2+  2+  ankle jerk 2+  2+  Hoffman no  no  plantar response down  down   SENSORY:  Normal  and symmetric perception of light touch, pinprick, vibration, and proprioception.     COORDINATION/GAIT: Normal finger-to- nose-finger.  Intact rapid alternating movements bilaterally.  Gait narrow based and stable. Tandem and stressed gait intact.    IMPRESSION/PLAN: Bilateral lower leg pain worse with exertion and standing.  Her neurological  exam is normal. She is unable to characterize her pain very well. Neither her exam or symptoms have features of neuropathy, RLS, or radiculopathy. Her exam does not show brisk reflexes which makes neurogenic claudication less likely. I discussed doing PT to see if this can provide relief. She will think about it and prefers to do EMG first. To help localize her symptoms, she will return for NCS/EMG of the legs.  Further recommendations pending results.    Thank you for allowing me to participate in patient's care.  If I can answer any additional questions, I would be pleased to do so.    Sincerely,    Giovanni Biby K. Allena Katz, DO

## 2021-03-28 NOTE — Patient Instructions (Signed)
Nerve testing of the both legs  ELECTROMYOGRAM AND NERVE CONDUCTION STUDIES (EMG/NCS) INSTRUCTIONS  How to Prepare The neurologist conducting the EMG will need to know if you have certain medical conditions. Tell the neurologist and other EMG lab personnel if you: Have a pacemaker or any other electrical medical device Take blood-thinning medications Have hemophilia, a blood-clotting disorder that causes prolonged bleeding Bathing Take a shower or bath shortly before your exam in order to remove oils from your skin. Don't apply lotions or creams before the exam.  What to Expect You'll likely be asked to change into a hospital gown for the procedure and lie down on an examination table. The following explanations can help you understand what will happen during the exam.  Electrodes. The neurologist or a technician places surface electrodes at various locations on your skin depending on where you're experiencing symptoms. Or the neurologist may insert needle electrodes at different sites depending on your symptoms.  Sensations. The electrodes will at times transmit a tiny electrical current that you may feel as a twinge or spasm. The needle electrode may cause discomfort or pain that usually ends shortly after the needle is removed. If you are concerned about discomfort or pain, you may want to talk to the neurologist about taking a short break during the exam.  Instructions. During the needle EMG, the neurologist will assess whether there is any spontaneous electrical activity when the muscle is at rest - activity that isn't present in healthy muscle tissue - and the degree of activity when you slightly contract the muscle.  He or she will give you instructions on resting and contracting a muscle at appropriate times. Depending on what muscles and nerves the neurologist is examining, he or she may ask you to change positions during the exam.  After your EMG You may experience some temporary, minor  bruising where the needle electrode was inserted into your muscle. This bruising should fade within several days. If it persists, contact your primary care doctor.

## 2021-04-03 ENCOUNTER — Encounter: Payer: Self-pay | Admitting: *Deleted

## 2021-04-03 ENCOUNTER — Telehealth: Payer: Self-pay | Admitting: *Deleted

## 2021-04-03 NOTE — Telephone Encounter (Signed)
Spoke with patient to follow up from [facility] 11/16 and assess navigation needs. Patient denies any questions or concerns at this time. She is just waiting on genetic results before scheduling sx.Encouraged her to call should anything arise. Patient verbalized understanding.

## 2021-04-08 ENCOUNTER — Encounter: Payer: Self-pay | Admitting: General Practice

## 2021-04-08 NOTE — Progress Notes (Signed)
[facility] Note  Attempted [place] follow-up phone call. Left voicemail encouraging return call.   [place], [place], [facility] Pager [phone] Voicemail [phone]

## 2021-04-09 ENCOUNTER — Institutional Professional Consult (permissible substitution): Payer: Medicare Other | Admitting: Emergency Medicine

## 2021-04-10 ENCOUNTER — Telehealth: Payer: Self-pay | Admitting: Genetic Counselor

## 2021-04-10 ENCOUNTER — Ambulatory Visit: Payer: Medicare Other | Admitting: Internal Medicine

## 2021-04-10 ENCOUNTER — Encounter: Payer: Self-pay | Admitting: Genetic Counselor

## 2021-04-10 DIAGNOSIS — Z1379 Encounter for other screening for genetic and chromosomal anomalies: Secondary | ICD-10-CM | POA: Insufficient documentation

## 2021-04-10 NOTE — Telephone Encounter (Signed)
I attempted to contact [name] to discuss her genetic testing results (47 genes). I left a voicemail requesting she call me back at [phone].  Lucille Passy, MS, [facility] Genetic Counselor [email or web address] (P) [phone]

## 2021-04-10 NOTE — Telephone Encounter (Signed)
I contacted Ms. Alphin to discuss her genetic testing results. No pathogenic variants were identified in the 47 genes analyzed.    The test report has been scanned into EPIC and is located under the Molecular Pathology section of the Results Review tab.  A portion of the result report is included below for reference. Detailed clinic note to follow.  Lucille Passy, MS, [facility] Genetic Counselor [email or web address] (P) [phone]

## 2021-04-11 ENCOUNTER — Encounter: Payer: Self-pay | Admitting: *Deleted

## 2021-04-15 ENCOUNTER — Encounter: Payer: Self-pay | Admitting: *Deleted

## 2021-04-22 ENCOUNTER — Ambulatory Visit: Payer: Self-pay | Admitting: Genetic Counselor

## 2021-04-22 DIAGNOSIS — Z1379 Encounter for other screening for genetic and chromosomal anomalies: Secondary | ICD-10-CM

## 2021-04-22 NOTE — Progress Notes (Signed)
HPI:   Ms. Rosen was previously seen in the [facility] clinic due to a personal and family history of cancer and concerns regarding a hereditary predisposition to cancer. Please refer to our prior cancer genetics clinic note for more information regarding our discussion, assessment and recommendations, at the time. Ms. Isaac recent genetic test results were disclosed to her, as were recommendations warranted by these results. These results and recommendations are discussed in more detail below.  CANCER HISTORY:  Oncology History  Malignant neoplasm of upper-outer quadrant of right breast in female, estrogen receptor positive ([place])  03/18/2021 Initial Diagnosis   Screening mammogram: a possible mass and distortion in the right breast, Diagnostic mammogram: highly suspicious 0.9 cm upper outer right breast mass. Biopsy: Grade 1 IDC with DCIS ER 100%, PR 1%, HER2 negative by [facility]   03/27/2021 Cancer Staging   Staging form: Breast, AJCC 8th Edition - Clinical stage from 03/27/2021: Stage IA (cT1b, cN0, cM0, G1, ER+, PR+, HER2-) - Signed by Nicholas Lose, MD on 03/27/2021 Stage prefix: Initial diagnosis Histologic grading system: 3 grade system     Genetic Testing   [facility] CustomNext Panel (47 genes) was Negative. Report date is 04/09/2021.  The CustomNext-Cancer+RNAinsight panel offered by [facility] includes sequencing and rearrangement analysis for the following 47 genes:  APC, ATM, AXIN2, BARD1, BMPR1A, BRCA1, BRCA2, BRIP1, CDH1, CDK4, CDKN2A, CHEK2, CTNNA1, DICER1, EPCAM, GREM1, HOXB13, KIT, MEN1, MLH1, MSH2, MSH3, MSH6, MUTYH, NBN, NF1, NTHL1, PALB2, PDGFRA, PMS2, POLD1, POLE, PTEN, RAD50, RAD51C, RAD51D, SDHA, SDHB, SDHC, SDHD, SMAD4, SMARCA4, STK11, TP53, TSC1, TSC2, and VHL.  RNA data is routinely analyzed for use in variant interpretation for all genes.     FAMILY HISTORY:  We obtained a detailed, 4-generation family history.  Significant diagnoses are listed below:       Family History  Problem Relation Age of Onset   Lung cancer Father 16   Prostate cancer Father     Breast cancer Sister 72   Lung cancer Maternal Grandfather     Lung cancer Paternal Grandfather        Ms. Surita's sister was diagnosed with stage IV breast cancer at 63. Her maternal grandfather was diagnosed with lung cancer and is deceased. Ms. Weisse father was diagnosed with prostate cancer and had a prostatectomy and chemotherapy as his treatment. He was also diagnosed with lung cancer and died at 34. Her paternal grandfather was diagnosed with lung cancer and is deceased.    Ms. Podgurski is unaware of previous family history of genetic testing for hereditary cancer risks. There is no reported Ashkenazi Jewish ancestry.   GENETIC TEST RESULTS:  The [facility] CustomNext Panel found no pathogenic mutations.   The CustomNext-Cancer+RNAinsight panel offered by [facility] includes sequencing and rearrangement analysis for the following 47 genes:  APC, ATM, AXIN2, BARD1, BMPR1A, BRCA1, BRCA2, BRIP1, CDH1, CDK4, CDKN2A, CHEK2, CTNNA1, DICER1, EPCAM, GREM1, HOXB13, KIT, MEN1, MLH1, MSH2, MSH3, MSH6, MUTYH, NBN, NF1, NTHL1, PALB2, PDGFRA, PMS2, POLD1, POLE, PTEN, RAD50, RAD51C, RAD51D, SDHA, SDHB, SDHC, SDHD, SMAD4, SMARCA4, STK11, TP53, TSC1, TSC2, and VHL.  RNA data is routinely analyzed for use in variant interpretation for all genes.  The test report has been scanned into EPIC and is located under the Molecular Pathology section of the Results Review tab.  A portion of the result report is included below for reference. Genetic testing reported out on 04/09/2021.         Even though a pathogenic variant was not  identified, possible explanations for the cancer in the family may include: There may be no hereditary risk for cancer in the family. The cancers in Ms. Warrick and/or her family may be due to other genetic or environmental factors. There may be a gene mutation in one of these genes that  current testing methods cannot detect, but that chance is small. There could be another gene that has not yet been discovered, or that we have not yet tested, that is responsible for the cancer diagnoses in the family.  It is also possible there is a hereditary cause for the cancer in the family that Ms. Hermance did not inherit.  Therefore, it is important to remain in touch with cancer genetics in the future so that we can continue to offer Ms. Riva the most up to date genetic testing.   ADDITIONAL GENETIC TESTING:  We discussed with Ms. Leas that her genetic testing was fairly extensive.  If there are genes identified to increase cancer risk that can be analyzed in the future, we would be happy to discuss and coordinate this testing at that time.    CANCER SCREENING RECOMMENDATIONS:  Ms. Pritts test result is considered negative (normal).  This means that we have not identified a hereditary cause for her personal and family history of cancer at this time. Most cancers happen by chance and this negative test suggests that her cancer may fall into this category.    An individual's cancer risk and medical management are not determined by genetic test results alone. Overall cancer risk assessment incorporates additional factors, including personal medical history, family history, and any available genetic information that may result in a personalized plan for cancer prevention and surveillance. Therefore, it is recommended she continue to follow the cancer management and screening guidelines provided by her oncology and primary healthcare provider.  RECOMMENDATIONS FOR FAMILY MEMBERS:   Since she did not inherit a mutation in a cancer predisposition gene included on this panel, her children could not have inherited a mutation from her in one of these genes. Individuals in this family might be at some increased risk of developing cancer, over the general population risk, due to the family history of  cancer. We recommend women in this family have a yearly mammogram beginning at age 86, or 13 years younger than the earliest onset of cancer, an annual clinical breast exam, and perform monthly breast self-exams.  Other members of the family may still carry a pathogenic variant in one of these genes that Ms. Railsback did not inherit. Based on the family history, we recommend her sister, who was diagnosed with stage IV breast cancer, have genetic counseling and testing.  FOLLOW-UP:  Cancer genetics is a rapidly advancing field and it is possible that new genetic tests will be appropriate for her and/or her family members in the future. We encouraged her to remain in contact with cancer genetics on an annual basis so we can update her personal and family histories and let her know of advances in cancer genetics that may benefit this family.   Our contact number was provided. Ms. Deakins questions were answered to her satisfaction, and she knows she is welcome to call us at anytime with additional questions or concerns.   Lucille Passy, MS, [facility] Genetic Counselor [email or web address] (P) [phone]

## 2021-04-23 ENCOUNTER — Ambulatory Visit: Payer: Self-pay | Admitting: General Surgery

## 2021-04-23 ENCOUNTER — Other Ambulatory Visit: Payer: Self-pay | Admitting: General Surgery

## 2021-04-23 DIAGNOSIS — Z170 Estrogen receptor positive status [ER+]: Secondary | ICD-10-CM

## 2021-04-23 DIAGNOSIS — C50411 Malignant neoplasm of upper-outer quadrant of right female breast: Secondary | ICD-10-CM

## 2021-04-25 ENCOUNTER — Telehealth: Payer: Self-pay | Admitting: Hematology and Oncology

## 2021-04-25 ENCOUNTER — Encounter: Payer: Medicare Other | Admitting: Neurology

## 2021-04-25 NOTE — Telephone Encounter (Signed)
Scheduled per sch msg. Called and left msg  

## 2021-04-29 ENCOUNTER — Telehealth: Payer: Self-pay | Admitting: Physician Assistant

## 2021-04-29 ENCOUNTER — Ambulatory Visit: Payer: Medicare Other | Admitting: Internal Medicine

## 2021-04-29 NOTE — Telephone Encounter (Signed)
Called patient to inquire about which medication she had a question about. Unable to reach patient. Left message for patient to callback.

## 2021-04-29 NOTE — Telephone Encounter (Signed)
Patient called and wanted to speak specifically to [facility] regarding a medication that was given to her for something "personal" and wants [name] to call her back. [phone]

## 2021-05-07 ENCOUNTER — Encounter (HOSPITAL_BASED_OUTPATIENT_CLINIC_OR_DEPARTMENT_OTHER): Payer: Self-pay | Admitting: General Surgery

## 2021-05-14 ENCOUNTER — Institutional Professional Consult (permissible substitution): Payer: Medicare Other | Admitting: Emergency Medicine

## 2021-05-16 ENCOUNTER — Encounter: Payer: Self-pay | Admitting: *Deleted

## 2021-05-17 ENCOUNTER — Ambulatory Visit (HOSPITAL_BASED_OUTPATIENT_CLINIC_OR_DEPARTMENT_OTHER): Admission: RE | Admit: 2021-05-17 | Payer: Medicaid Other | Source: Home / Self Care | Admitting: General Surgery

## 2021-05-17 ENCOUNTER — Telehealth: Payer: Self-pay | Admitting: Hematology and Oncology

## 2021-05-17 SURGERY — BREAST LUMPECTOMY WITH RADIOACTIVE SEED AND SENTINEL LYMPH NODE BIOPSY
Anesthesia: General | Site: Breast | Laterality: Right

## 2021-05-17 NOTE — Telephone Encounter (Signed)
Sch per 1/5 inbasket, pt aware °

## 2021-05-23 ENCOUNTER — Encounter (HOSPITAL_BASED_OUTPATIENT_CLINIC_OR_DEPARTMENT_OTHER): Payer: Self-pay | Admitting: General Surgery

## 2021-05-23 ENCOUNTER — Other Ambulatory Visit: Payer: Self-pay

## 2021-05-28 ENCOUNTER — Ambulatory Visit: Payer: Medicare Other | Admitting: Hematology and Oncology

## 2021-05-30 ENCOUNTER — Encounter (HOSPITAL_BASED_OUTPATIENT_CLINIC_OR_DEPARTMENT_OTHER)
Admission: RE | Admit: 2021-05-30 | Discharge: 2021-05-30 | Disposition: A | Discharge status: Home / Self Care | Payer: Medicare Other | Source: Ambulatory Visit | Attending: General Surgery | Admitting: General Surgery

## 2021-05-30 DIAGNOSIS — Z01818 Encounter for other preprocedural examination: Secondary | ICD-10-CM | POA: Diagnosis not present

## 2021-05-30 LAB — BASIC METABOLIC PANEL
Anion gap: 12 (ref 5–15)
BUN: 13 mg/dL (ref 6–20)
CO2: 29 mmol/L (ref 22–32)
Calcium: 9.9 mg/dL (ref 8.9–10.3)
Chloride: 98 mmol/L (ref 98–111)
Creatinine, Ser: 1.18 mg/dL — ABNORMAL HIGH (ref 0.44–1.00)
GFR, Estimated: 53 mL/min — ABNORMAL LOW (ref >60)
Glucose, Bld: 94 mg/dL (ref 70–99)
Potassium: 4.1 mmol/L (ref 3.5–5.1)
Sodium: 139 mmol/L (ref 135–145)

## 2021-05-30 NOTE — Progress Notes (Signed)

## 2021-05-31 ENCOUNTER — Ambulatory Visit
Admission: RE | Admit: 2021-05-31 | Discharge: 2021-05-31 | Disposition: A | Discharge status: Home / Self Care | Payer: Medicare Other | Source: Ambulatory Visit | Attending: General Surgery | Admitting: General Surgery

## 2021-05-31 DIAGNOSIS — Z170 Estrogen receptor positive status [ER+]: Secondary | ICD-10-CM

## 2021-05-31 DIAGNOSIS — C50411 Malignant neoplasm of upper-outer quadrant of right female breast: Secondary | ICD-10-CM

## 2021-06-03 ENCOUNTER — Encounter (HOSPITAL_BASED_OUTPATIENT_CLINIC_OR_DEPARTMENT_OTHER): Payer: Self-pay | Admitting: General Surgery

## 2021-06-03 ENCOUNTER — Other Ambulatory Visit: Payer: Self-pay

## 2021-06-03 ENCOUNTER — Encounter (HOSPITAL_BASED_OUTPATIENT_CLINIC_OR_DEPARTMENT_OTHER)
Admission: RE | Disposition: A | Discharge status: Home / Self Care | Payer: Self-pay | Source: Home / Self Care | Attending: General Surgery

## 2021-06-03 ENCOUNTER — Ambulatory Visit (HOSPITAL_BASED_OUTPATIENT_CLINIC_OR_DEPARTMENT_OTHER)
Admission: RE | Admit: 2021-06-03 | Discharge: 2021-06-03 | Disposition: A | Discharge status: Home / Self Care | Payer: Medicare Other | Attending: General Surgery | Admitting: General Surgery

## 2021-06-03 ENCOUNTER — Ambulatory Visit (HOSPITAL_BASED_OUTPATIENT_CLINIC_OR_DEPARTMENT_OTHER): Payer: Medicare Other | Admitting: Anesthesiology

## 2021-06-03 ENCOUNTER — Ambulatory Visit
Admission: RE | Admit: 2021-06-03 | Discharge: 2021-06-03 | Disposition: A | Discharge status: Home / Self Care | Payer: Medicare Other | Source: Ambulatory Visit | Attending: General Surgery | Admitting: General Surgery

## 2021-06-03 DIAGNOSIS — F32A Depression, unspecified: Secondary | ICD-10-CM | POA: Insufficient documentation

## 2021-06-03 DIAGNOSIS — D0511 Intraductal carcinoma in situ of right breast: Secondary | ICD-10-CM | POA: Insufficient documentation

## 2021-06-03 DIAGNOSIS — Z170 Estrogen receptor positive status [ER+]: Secondary | ICD-10-CM | POA: Diagnosis not present

## 2021-06-03 DIAGNOSIS — K219 Gastro-esophageal reflux disease without esophagitis: Secondary | ICD-10-CM | POA: Diagnosis not present

## 2021-06-03 DIAGNOSIS — N6011 Diffuse cystic mastopathy of right breast: Secondary | ICD-10-CM | POA: Diagnosis not present

## 2021-06-03 DIAGNOSIS — Z79899 Other long term (current) drug therapy: Secondary | ICD-10-CM | POA: Insufficient documentation

## 2021-06-03 DIAGNOSIS — Z86718 Personal history of other venous thrombosis and embolism: Secondary | ICD-10-CM | POA: Diagnosis not present

## 2021-06-03 DIAGNOSIS — F419 Anxiety disorder, unspecified: Secondary | ICD-10-CM | POA: Diagnosis not present

## 2021-06-03 DIAGNOSIS — I10 Essential (primary) hypertension: Secondary | ICD-10-CM | POA: Insufficient documentation

## 2021-06-03 DIAGNOSIS — M797 Fibromyalgia: Secondary | ICD-10-CM | POA: Insufficient documentation

## 2021-06-03 HISTORY — PX: BREAST LUMPECTOMY WITH RADIOACTIVE SEED AND SENTINEL LYMPH NODE BIOPSY: SHX6550

## 2021-06-03 SURGERY — BREAST LUMPECTOMY WITH RADIOACTIVE SEED AND SENTINEL LYMPH NODE BIOPSY
Anesthesia: Regional | Site: Breast | Laterality: Right

## 2021-06-03 MED ORDER — PHENYLEPHRINE HCL (PRESSORS) 10 MG/ML IV SOLN
INTRAVENOUS | Status: DC | PRN
  Administered 2021-06-03 (×3): 80 ug via INTRAVENOUS

## 2021-06-03 MED ORDER — BUPIVACAINE-EPINEPHRINE (PF) 0.25% -1:200000 IJ SOLN
INTRAMUSCULAR | Status: AC
  Filled 2021-06-03: qty 60

## 2021-06-03 MED ORDER — CEFAZOLIN SODIUM-DEXTROSE 2-4 GM/100ML-% IV SOLN
2.0000 g | INTRAVENOUS | Status: AC
  Administered 2021-06-03: 2 g via INTRAVENOUS

## 2021-06-03 MED ORDER — CLONIDINE HCL (ANALGESIA) 100 MCG/ML EP SOLN
EPIDURAL | Status: DC | PRN
  Administered 2021-06-03: 100 ug

## 2021-06-03 MED ORDER — ROPIVACAINE HCL 5 MG/ML IJ SOLN
INTRAMUSCULAR | Status: DC | PRN
  Administered 2021-06-03: 30 mL via PERINEURAL

## 2021-06-03 MED ORDER — DEXAMETHASONE SODIUM PHOSPHATE 10 MG/ML IJ SOLN
INTRAMUSCULAR | Status: AC
  Filled 2021-06-03: qty 1

## 2021-06-03 MED ORDER — OXYCODONE HCL 5 MG PO TABS
5.0000 mg | ORAL_TABLET | Freq: Once | ORAL | Status: AC | PRN
  Administered 2021-06-03: 5 mg via ORAL

## 2021-06-03 MED ORDER — CHLORHEXIDINE GLUCONATE CLOTH 2 % EX PADS: 6.0000 | MEDICATED_PAD | Freq: Once | CUTANEOUS | Status: DC

## 2021-06-03 MED ORDER — ONDANSETRON HCL 4 MG/2ML IJ SOLN
INTRAMUSCULAR | Status: DC | PRN
Start: 2021-06-03 — End: 2021-06-03
  Administered 2021-06-03: 4 mg via INTRAVENOUS

## 2021-06-03 MED ORDER — LACTATED RINGERS IV SOLN: INTRAVENOUS | Status: DC

## 2021-06-03 MED ORDER — PHENYLEPHRINE 40 MCG/ML (10ML) SYRINGE FOR IV PUSH (FOR BLOOD PRESSURE SUPPORT)
PREFILLED_SYRINGE | INTRAVENOUS | Status: AC
  Filled 2021-06-03: qty 10

## 2021-06-03 MED ORDER — HYDROCODONE-ACETAMINOPHEN 5-325 MG PO TABS: 1.0000 | ORAL_TABLET | Freq: Four times a day (QID) | ORAL | 0 refills | Status: DC | PRN

## 2021-06-03 MED ORDER — GABAPENTIN 300 MG PO CAPS
300.0000 mg | ORAL_CAPSULE | ORAL | Status: AC
  Administered 2021-06-03: 300 mg via ORAL

## 2021-06-03 MED ORDER — BUPIVACAINE-EPINEPHRINE 0.25% -1:200000 IJ SOLN
INTRAMUSCULAR | Status: DC | PRN
  Administered 2021-06-03: 7 mL

## 2021-06-03 MED ORDER — FENTANYL CITRATE (PF) 100 MCG/2ML IJ SOLN
INTRAMUSCULAR | Status: DC | PRN
  Administered 2021-06-03: 50 ug via INTRAVENOUS

## 2021-06-03 MED ORDER — CELECOXIB 200 MG PO CAPS
200.0000 mg | ORAL_CAPSULE | ORAL | Status: AC
  Administered 2021-06-03: 200 mg via ORAL

## 2021-06-03 MED ORDER — ACETAMINOPHEN 500 MG PO TABS
ORAL_TABLET | ORAL | Status: AC
  Filled 2021-06-03: qty 2

## 2021-06-03 MED ORDER — GLYCOPYRROLATE PF 0.2 MG/ML IJ SOSY
PREFILLED_SYRINGE | INTRAMUSCULAR | Status: AC
  Filled 2021-06-03: qty 1

## 2021-06-03 MED ORDER — MAGTRACE LYMPHATIC TRACER
INTRAMUSCULAR | Status: DC | PRN
  Administered 2021-06-03: 2 mL via INTRAMUSCULAR

## 2021-06-03 MED ORDER — OXYCODONE HCL 5 MG PO TABS
ORAL_TABLET | ORAL | Status: AC
  Filled 2021-06-03: qty 1

## 2021-06-03 MED ORDER — OXYCODONE HCL 5 MG/5ML PO SOLN: 5.0000 mg | Freq: Once | ORAL | Status: AC | PRN

## 2021-06-03 MED ORDER — CEFAZOLIN SODIUM-DEXTROSE 2-4 GM/100ML-% IV SOLN
INTRAVENOUS | Status: AC
  Filled 2021-06-03: qty 100

## 2021-06-03 MED ORDER — FENTANYL CITRATE (PF) 100 MCG/2ML IJ SOLN
INTRAMUSCULAR | Status: AC
  Filled 2021-06-03: qty 2

## 2021-06-03 MED ORDER — PROMETHAZINE HCL 25 MG/ML IJ SOLN: 6.2500 mg | INTRAMUSCULAR | Status: DC | PRN

## 2021-06-03 MED ORDER — PROPOFOL 10 MG/ML IV BOLUS
INTRAVENOUS | Status: DC | PRN
Start: 2021-06-03 — End: 2021-06-03
  Administered 2021-06-03: 200 mg via INTRAVENOUS

## 2021-06-03 MED ORDER — DEXAMETHASONE SODIUM PHOSPHATE 10 MG/ML IJ SOLN
INTRAMUSCULAR | Status: DC | PRN
  Administered 2021-06-03: 5 mg via INTRAVENOUS

## 2021-06-03 MED ORDER — PROPOFOL 10 MG/ML IV BOLUS
INTRAVENOUS | Status: AC
  Filled 2021-06-03: qty 20

## 2021-06-03 MED ORDER — ACETAMINOPHEN 500 MG PO TABS
1000.0000 mg | ORAL_TABLET | ORAL | Status: AC
  Administered 2021-06-03: 1000 mg via ORAL

## 2021-06-03 MED ORDER — EPHEDRINE SULFATE (PRESSORS) 50 MG/ML IJ SOLN
INTRAMUSCULAR | Status: DC | PRN
  Administered 2021-06-03 (×2): 10 mg via INTRAVENOUS
  Administered 2021-06-03: 5 mg via INTRAVENOUS

## 2021-06-03 MED ORDER — FENTANYL CITRATE (PF) 100 MCG/2ML IJ SOLN: 25.0000 ug | INTRAMUSCULAR | Status: DC | PRN

## 2021-06-03 MED ORDER — LIDOCAINE HCL (CARDIAC) PF 100 MG/5ML IV SOSY
PREFILLED_SYRINGE | INTRAVENOUS | Status: DC | PRN
Start: 2021-06-03 — End: 2021-06-03
  Administered 2021-06-03: 40 mg via INTRATRACHEAL

## 2021-06-03 MED ORDER — MIDAZOLAM HCL 2 MG/2ML IJ SOLN
2.0000 mg | Freq: Once | INTRAMUSCULAR | Status: AC
  Administered 2021-06-03: 2 mg via INTRAVENOUS

## 2021-06-03 MED ORDER — AMISULPRIDE (ANTIEMETIC) 5 MG/2ML IV SOLN: 10.0000 mg | Freq: Once | INTRAVENOUS | Status: DC | PRN

## 2021-06-03 MED ORDER — ONDANSETRON HCL 4 MG/2ML IJ SOLN
INTRAMUSCULAR | Status: AC
  Filled 2021-06-03: qty 2

## 2021-06-03 MED ORDER — GABAPENTIN 300 MG PO CAPS
ORAL_CAPSULE | ORAL | Status: AC
  Filled 2021-06-03: qty 1

## 2021-06-03 MED ORDER — FENTANYL CITRATE (PF) 100 MCG/2ML IJ SOLN
50.0000 ug | Freq: Once | INTRAMUSCULAR | Status: AC
  Administered 2021-06-03: 50 ug via INTRAVENOUS

## 2021-06-03 MED ORDER — MIDAZOLAM HCL 2 MG/2ML IJ SOLN
INTRAMUSCULAR | Status: AC
  Filled 2021-06-03: qty 2

## 2021-06-03 MED ORDER — EPHEDRINE 5 MG/ML INJ
INTRAVENOUS | Status: AC
  Filled 2021-06-03: qty 5

## 2021-06-03 MED ORDER — GLYCOPYRROLATE 0.2 MG/ML IJ SOLN
INTRAMUSCULAR | Status: DC | PRN
  Administered 2021-06-03: .2 mg via INTRAVENOUS

## 2021-06-03 SURGICAL SUPPLY — 44 items
ADH SKN CLS APL DERMABOND .7 (GAUZE/BANDAGES/DRESSINGS) ×1
APL PRP STRL LF DISP 70% ISPRP (MISCELLANEOUS) ×1
APPLIER CLIP 9.375 MED OPEN (MISCELLANEOUS) ×3
APR CLP MED 9.3 20 MLT OPN (MISCELLANEOUS) ×1
BLADE SURG 15 STRL LF DISP TIS (BLADE) ×2 IMPLANT
BLADE SURG 15 STRL SS (BLADE) ×3
CANISTER SUC SOCK COL 7IN (MISCELLANEOUS) IMPLANT
CANISTER SUCT 1200ML W/VALVE (MISCELLANEOUS) IMPLANT
CHLORAPREP W/TINT 26 (MISCELLANEOUS) ×4 IMPLANT
CLIP APPLIE 9.375 MED OPEN (MISCELLANEOUS) ×2 IMPLANT
COVER BACK TABLE 60X90IN (DRAPES) ×4 IMPLANT
COVER MAYO STAND STRL (DRAPES) ×4 IMPLANT
COVER PROBE W GEL 5X96 (DRAPES) ×4 IMPLANT
DERMABOND ADVANCED (GAUZE/BANDAGES/DRESSINGS) ×2
DERMABOND ADVANCED .7 DNX12 (GAUZE/BANDAGES/DRESSINGS) ×2 IMPLANT
DRAPE LAPAROSCOPIC ABDOMINAL (DRAPES) ×4 IMPLANT
DRAPE UTILITY XL STRL (DRAPES) ×4 IMPLANT
ELECT COATED BLADE 2.86 ST (ELECTRODE) ×4 IMPLANT
ELECT REM PT RETURN 9FT ADLT (ELECTROSURGICAL) ×3
ELECTRODE REM PT RTRN 9FT ADLT (ELECTROSURGICAL) ×2 IMPLANT
GLOVE SURG ENC MOIS LTX SZ7.5 (GLOVE) ×4 IMPLANT
GLOVE SURG POLYISO LF SZ7 (GLOVE) ×2 IMPLANT
GLOVE SURG POLYISO LF SZ7.5 (GLOVE) ×2 IMPLANT
GLOVE SURG UNDER POLY LF SZ7 (GLOVE) ×2 IMPLANT
GLOVE SURG UNDER POLY LF SZ7.5 (GLOVE) ×2 IMPLANT
GOWN STRL REUS W/ TWL LRG LVL3 (GOWN DISPOSABLE) ×4 IMPLANT
GOWN STRL REUS W/TWL LRG LVL3 (GOWN DISPOSABLE) ×6
KIT MARKER MARGIN INK (KITS) ×4 IMPLANT
NDL HYPO 25X1 1.5 SAFETY (NEEDLE) ×2 IMPLANT
NEEDLE HYPO 25X1 1.5 SAFETY (NEEDLE) ×6 IMPLANT
NS IRRIG 1000ML POUR BTL (IV SOLUTION) IMPLANT
PACK BASIN DAY SURGERY FS (CUSTOM PROCEDURE TRAY) ×4 IMPLANT
PENCIL SMOKE EVACUATOR (MISCELLANEOUS) ×4 IMPLANT
SLEEVE SCD COMPRESS KNEE MED (STOCKING) ×4 IMPLANT
SPONGE T-LAP 18X18 ~~LOC~~+RFID (SPONGE) ×4 IMPLANT
SUT MON AB 4-0 PC3 18 (SUTURE) ×8 IMPLANT
SUT VICRYL 3-0 CR8 SH (SUTURE) ×4 IMPLANT
SYR CONTROL 10ML LL (SYRINGE) ×4 IMPLANT
TOWEL GREEN STERILE FF (TOWEL DISPOSABLE) ×4 IMPLANT
TRACER MAGTRACE VIAL (MISCELLANEOUS) ×2 IMPLANT
TRAY FAXITRON CT DISP (TRAY / TRAY PROCEDURE) ×4 IMPLANT
TUBE CONNECTING 20'X1/4 (TUBING) ×1
TUBE CONNECTING 20X1/4 (TUBING) ×1 IMPLANT
YANKAUER SUCT BULB TIP NO VENT (SUCTIONS) ×2 IMPLANT

## 2021-06-03 NOTE — Progress Notes (Signed)
Assisted Dr. Roanna Banning with right, ultrasound guided, pectoralis block. Side rails up, monitors on throughout procedure. See vital signs in flow sheet. Tolerated Procedure well.

## 2021-06-03 NOTE — H&P (Signed)
PROVIDER: Landry Corporal, MD  MRN: [id number] DOB: 12-Oct-1960 Subjective  Chief Complaint: Breast Cancer   History of Present Illness: [name] is a 61 y.o. female who is seen today as an office consultation at the request of Dr. Pasty Arch for evaluation of Breast Cancer .   We are asked to see the patient in consultation by Dr. Lindi Adie to evaluate her for a new right breast cancer. The patient is a 61 year old white female who recently had a hiatal hernia repair. An abnormality was noted in the breast on her chest x-ray. This prompted the work-up which ended up finding a 9 mm mass in the upper outer quadrant of the right breast. The axilla looked negative. 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 She is otherwise in pretty good health and does not smoke. She does have a family history of breast cancer significant for stage IV breast cancer in her sister  Review of Systems: A complete review of systems was obtained from the patient. I have reviewed this information and discussed as appropriate with the patient. See HPI as well for other ROS.  ROS   Medical History: Past Medical History:  Diagnosis Date   Anemia   Anxiety   Arthritis   DVT (deep venous thrombosis) (CMS-HCC)   GERD (gastroesophageal reflux disease)   Hypertension   Patient Active Problem List  Diagnosis   Rheumatoid arthritis (CMS-HCC)   Essential hypertension   Anxiety   SOB (shortness of breath)   Neck pain   Bleeds easily (CMS-HCC)   Easy bruising   History of depression   Nervousness   Malignant neoplasm of upper-outer quadrant of right breast in female, estrogen receptor positive (CMS-HCC)   Past Surgical History:  Procedure Laterality Date   feet surgery Bilateral  Date unknown   left wrist  Pins placed   REPAIR HIATAL HERNIA  Aug/Sept 2022    Allergies  Allergen Reactions   Tramadol Itching  "bugs crawling all  over"   Current Outpatient Medications on File Prior to Visit  Medication Sig Dispense Refill   FLUoxetine (PROZAC) 20 MG tablet Take 1.5 tablets (30 mg total) by mouth once daily   hydroCHLOROthiazide (HYDRODIURIL) 50 MG tablet Take 1 tablet (50 mg total) by mouth once daily   lisinopriL (ZESTRIL) 40 MG tablet Take 1 tablet (40 mg total) by mouth once daily   metoprolol succinate (TOPROL-XL) 25 MG XL tablet Take 1 tablet (25 mg total) by mouth once daily   No current facility-administered medications on file prior to visit.   Family History  Problem Relation Age of Onset   Stroke Mother   Skin cancer Father   Lung cancer Father   Prostate cancer Father   Breast cancer Sister   Breast cancer Paternal Aunt   Lung cancer Maternal Grandfather   Lung cancer Paternal Grandfather   Diabetes Son    Social History   Tobacco Use  Smoking Status Never  Smokeless Tobacco Never    Social History   Socioeconomic History   Marital status: Unknown  Tobacco Use   Smoking status: Never   Smokeless tobacco: Never  Vaping Use   Vaping Use: Never used  Substance and Sexual Activity   Alcohol use: Never   Drug use: Never   Objective:  There were no vitals filed for this visit.  There is no height or weight on file to calculate  BMI.  Physical Exam Vitals reviewed.  Constitutional:  General: She is not in acute distress. Appearance: Normal appearance.  HENT:  Head: Normocephalic and atraumatic.  Right Ear: External ear normal.  Left Ear: External ear normal.  Nose: Nose normal.  Mouth/Throat:  Mouth: Mucous membranes are moist.  Pharynx: Oropharynx is clear.  Eyes:  General: No scleral icterus. Extraocular Movements: Extraocular movements intact.  Conjunctiva/sclera: Conjunctivae normal.  Pupils: Pupils are equal, round, and reactive to light.  Cardiovascular:  Rate and Rhythm: Normal rate and regular rhythm.  Pulses: Normal pulses.  Heart sounds: Normal heart sounds.   Pulmonary:  Effort: Pulmonary effort is normal. No respiratory distress.  Breath sounds: Normal breath sounds.  Abdominal:  General: Bowel sounds are normal.  Palpations: Abdomen is soft.  Tenderness: There is no abdominal tenderness.  Musculoskeletal:  General: No swelling, tenderness or deformity. Normal range of motion.  Cervical back: Normal range of motion and neck supple.  Skin: General: Skin is warm and dry.  Coloration: Skin is not jaundiced.  Neurological:  General: No focal deficit present.  Mental Status: She is alert and oriented to person, place, and time.  Psychiatric:  Mood and Affect: Mood normal.  Behavior: Behavior normal.     Breast: There is no palpable mass in either breast. There is no palpable axillary, supraclavicular, or cervical lymphadenopathy.  Labs, Imaging and Diagnostic Testing:  Assessment and Plan:  Diagnoses and all orders for this visit:  Malignant neoplasm of upper-outer quadrant of right breast in female, estrogen receptor positive (CMS-HCC)    The patient appears to have a small stage I cancer in the upper outer quadrant of the right breast with clinically negative nodes and favorable markers. I have discussed with her in detail the different options for treatment. At this point she is undecided between lumpectomy and mastectomy. She would like to have genetics done given that she has a sister with stage IV breast cancer. I have discussed with her in detail the different risks and benefits as well as some of the technical aspects of the different surgeries and she understands. She will notify us once she has made her final decision. She will meet with medical and radiation oncology to discuss adjuvant therapy. She will also meet with physical therapy for preoperative lymphedema testing.

## 2021-06-03 NOTE — Interval H&P Note (Signed)
History and Physical Interval Note:  06/03/2021 8:11 AM  [name]  has presented today for surgery, with the diagnosis of RIGHT BREAST CANCER.  The various methods of treatment have been discussed with the patient and family. After consideration of risks, benefits and other options for treatment, the patient has consented to  Procedure(s): RIGHT BREAST LUMPECTOMY WITH RADIOACTIVE SEED AND SENTINEL LYMPH NODE BIOPSY (Right) as a surgical intervention.  The patient's history has been reviewed, patient examined, no change in status, stable for surgery.  I have reviewed the patient's chart and labs.  Questions were answered to the patient's satisfaction.     [name]

## 2021-06-03 NOTE — Transfer of Care (Signed)
Immediate Anesthesia Transfer of Care Note  Patient: SIBLE STRALEY  Procedure(s) Performed: RIGHT BREAST LUMPECTOMY WITH RADIOACTIVE SEED AND SENTINEL LYMPH NODE BIOPSY (Right: Breast)  Patient Location: PACU  Anesthesia Type:General  Level of Consciousness: drowsy and patient cooperative  Airway & Oxygen Therapy: Patient Spontanous Breathing and Patient connected to face mask oxygen  Post-op Assessment: Report given to RN and Post -op Vital signs reviewed and stable  Post vital signs: Reviewed and stable  Last Vitals:  Vitals Value Taken Time  BP    Temp    Pulse 68 06/03/21 0950  Resp    SpO2 99 % 06/03/21 0950  Vitals shown include unvalidated device data.  Last Pain:  Vitals:   06/03/21 0657  TempSrc: Oral  PainSc: 0-No pain         Complications: No notable events documented.

## 2021-06-03 NOTE — Anesthesia Postprocedure Evaluation (Signed)
Anesthesia Post Note  Patient: [name]  Procedure(s) Performed: RIGHT BREAST LUMPECTOMY WITH RADIOACTIVE SEED AND SENTINEL LYMPH NODE BIOPSY (Right: Breast)     Patient location during evaluation: PACU Anesthesia Type: Regional and General Level of consciousness: awake and alert Pain management: pain level controlled Vital Signs Assessment: post-procedure vital signs reviewed and stable Respiratory status: spontaneous breathing, nonlabored ventilation, respiratory function stable and patient connected to nasal cannula oxygen Cardiovascular status: blood pressure returned to baseline and stable Postop Assessment: no apparent nausea or vomiting Anesthetic complications: no   No notable events documented.  Last Vitals:  Vitals:   06/03/21 1015 06/03/21 1020  BP: 107/63 (!) 117/96  Pulse: 65 63  Resp: 14 13  Temp:  (!) 36.2 C  SpO2: 97% 96%    Last Pain:  Vitals:   06/03/21 1020  TempSrc:   PainSc: 8                  Netasha Wehrli

## 2021-06-03 NOTE — Anesthesia Procedure Notes (Signed)
Procedure Name: LMA Insertion Date/Time: 06/03/2021 8:28 AM Performed by: Glory Buff, CRNA Pre-anesthesia Checklist: Patient identified, Emergency Drugs available, Suction available and Patient being monitored Patient Re-evaluated:Patient Re-evaluated prior to induction Oxygen Delivery Method: Circle system utilized Preoxygenation: Pre-oxygenation with 100% oxygen Induction Type: IV induction LMA: LMA inserted LMA Size: 4.0 Number of attempts: 1 Placement Confirmation: positive ETCO2 Tube secured with: Tape Dental Injury: Injury to lip  Comments: Upper lip pinched with LMA insertion, artificial tears lubricant applied to lips.

## 2021-06-03 NOTE — Op Note (Signed)
06/03/2021  9:50 AM  PATIENT:  [name]  61 y.o. female  PRE-OPERATIVE DIAGNOSIS:  RIGHT BREAST CANCER  POST-OPERATIVE DIAGNOSIS:  RIGHT BREAST CANCER  PROCEDURE:  Procedure(s): RIGHT BREAST LUMPECTOMY WITH RADIOACTIVE SEED LOCALIZATION AND DEEP RIGHT AXILLARY SENTINEL LYMPH NODE BIOPSY (Right)  SURGEON:  Surgeon(s) and Role:    * Jovita Kussmaul, MD - Primary  PHYSICIAN ASSISTANT:   ASSISTANTS: Dr. Eustace Moore   ANESTHESIA:   local and general  EBL:  2 mL   BLOOD ADMINISTERED:none  DRAINS: none   LOCAL MEDICATIONS USED:  MARCAINE     SPECIMEN:  Source of Specimen:  right breast tissue with additional inferior margin and sentinel nodes x 2  DISPOSITION OF SPECIMEN:  PATHOLOGY  COUNTS:  YES  TOURNIQUET:  * No tourniquets in log *  DICTATION: .Dragon Dictation  After informed consent was obtained the patient was brought to the operating room and placed in the supine position on the operating table.  After adequate induction of general anesthesia the patient's right chest, breast, and axillary area were prepped with ChloraPrep, allowed to dry, and draped in usual sterile manner.  An appropriate timeout was performed.  Previously an I-125 seed was placed in the upper outer right breast to mark an area of invasive breast cancer.  At this point, 2 cc of iron oxide were also injected in the subareolar plexus of the right breast and the breast was massaged for 5 minutes.  The neoprobe was set to I-125 in the area of radioactivity was readily identified far in the upper outer quadrant.  The mag trace was also used to identify an area of signal in the right axilla.  These areas were very close to each other so I elected to make an elliptical incision in the skin overlying the area of radioactivity in the breast.  The incision was carried through the skin and subcutaneous tissue sharply with the electrocautery.  Dissection was then carried out around the radioactive seed while checking the  area of radioactivity frequently.  Once this was accomplished the lumpectomy specimen was removed from the patient.  The lumpectomy was oriented with the appropriate paint colors.  A specimen radiograph was obtained that showed the clip and seed to be near the center of the specimen.  The specimen was then sent to pathology for further evaluation.  I did elect to take an additional inferior margin and this was also marked appropriately and sent to pathology.  Next through this incision the deep right axillary space was entered.  The mag trace was used to identify a hotspot in the right axilla.  I was actually able to identify 2 hot spots.  Each of these nodes was removed sharply with the electrocautery and the surrounding small vessels and lymphatics were controlled with clips.  These were sent as sentinel nodes numbers 1 and 2.  No other hot or palpable nodes were identified in the right axilla.  Hemostasis was achieved using the Bovie electrocautery.  The deep right axillary space was closed with interrupted 3-0 Vicryl stitches.  The lumpectomy cavity was then closed also with interrupted 3-0 Vicryl stitches.  The skin was closed with a running 4-0 Monocryl subcuticular stitch.  Dermabond dressings were applied.  The patient tolerated the procedure well.  At the end of the case all needle sponge and instrument counts were correct.  The patient was then awakened and taken to recovery in stable condition. I was personally present during the key and  critical portions of this procedure and immediately available throughout the entire procedure, as documented in my operative note.   PLAN OF CARE: Discharge to home after PACU  PATIENT DISPOSITION:  PACU - hemodynamically stable.   Delay start of Pharmacological VTE agent (>24hrs) due to surgical blood loss or risk of bleeding: not applicable

## 2021-06-03 NOTE — Anesthesia Preprocedure Evaluation (Addendum)
Anesthesia Evaluation  Patient identified by MRN, date of birth, ID band Patient awake    Reviewed: Allergy & Precautions, NPO status , Patient's Chart, lab work & pertinent test results  Airway Mallampati: I  TM Distance: >3 FB Neck ROM: Full    Dental no notable dental hx.    Pulmonary shortness of breath,  Oxygen prn   Pulmonary exam normal breath sounds clear to auscultation       Cardiovascular hypertension, Pt. on home beta blockers and Pt. on medications Normal cardiovascular exam Rhythm:Regular Rate:Normal     Neuro/Psych PSYCHIATRIC DISORDERS Anxiety Depression  Neuromuscular disease    GI/Hepatic Neg liver ROS, hiatal hernia,   Endo/Other  negative endocrine ROS  Renal/GU negative Renal ROS     Musculoskeletal  (+) Arthritis , Fibromyalgia -  Abdominal (+) + obese,   Peds  Hematology  (+) anemia ,   Anesthesia Other Findings RIGHT BREAST CANCER  Reproductive/Obstetrics                            Anesthesia Physical Anesthesia Plan  ASA: 3  Anesthesia Plan: General and Regional   Post-op Pain Management:    Induction: Intravenous  PONV Risk Score and Plan: 3 and Ondansetron, Dexamethasone, Midazolam and Treatment may vary due to age or medical condition  Airway Management Planned: LMA  Additional Equipment:   Intra-op Plan:   Post-operative Plan: Extubation in OR  Informed Consent: I have reviewed the patients History and Physical, chart, labs and discussed the procedure including the risks, benefits and alternatives for the proposed anesthesia with the patient or authorized representative who has indicated his/her understanding and acceptance.     Dental advisory given  Plan Discussed with: CRNA  Anesthesia Plan Comments:        Anesthesia Quick Evaluation

## 2021-06-03 NOTE — Discharge Instructions (Addendum)
°  Post Anesthesia Home Care Instructions  Activity: Get plenty of rest for the remainder of the day. A responsible individual must stay with you for 24 hours following the procedure.  For the next 24 hours, DO NOT: -Drive a car -Paediatric nurse -Drink alcoholic beverages -Take any medication unless instructed by your physician -Make any legal decisions or sign important papers.  Meals: Start with liquid foods such as gelatin or soup. Progress to regular foods as tolerated. Avoid greasy, spicy, heavy foods. If nausea and/or vomiting occur, drink only clear liquids until the nausea and/or vomiting subsides. Call your physician if vomiting continues.  Special Instructions/Symptoms: Your throat may feel dry or sore from the anesthesia or the breathing tube placed in your throat during surgery. If this causes discomfort, gargle with warm salt water. The discomfort should disappear within 24 hours.  If you had a scopolamine patch placed behind your ear for the management of post- operative nausea and/or vomiting:  1. The medication in the patch is effective for 72 hours, after which it should be removed.  Wrap patch in a tissue and discard in the trash. Wash hands thoroughly with soap and water. 2. You may remove the patch earlier than 72 hours if you experience unpleasant side effects which may include dry mouth, dizziness or visual disturbances. 3. Avoid touching the patch. Wash your hands with soap and water after contact with the patch.  Next dose of Tylenol after 1pm as needed for pain. Last dose of Tylenol 1,000mg  given at 7:05am. Next dose of Ibuprofen/NSAIDs after 3pm as needed for pain. Last dose of Celebrex 200mg  given at 7:05am.

## 2021-06-03 NOTE — Anesthesia Procedure Notes (Signed)
Anesthesia Regional Block: Pectoralis block   Pre-Anesthetic Checklist: , timeout performed,  Correct Patient, Correct Site, Correct Laterality,  Correct Procedure, Correct Position, site marked,  Risks and benefits discussed,  Surgical consent,  Pre-op evaluation,  At surgeon's request and post-op pain management  Laterality: Right  Prep: chloraprep       Needles:  Injection technique: Single-shot  Needle Type: Echogenic Stimulator Needle     Needle Length: 9cm  Needle Gauge: 21     Additional Needles:   Procedures:,,,, ultrasound used (permanent image in chart),,    Narrative:  Start time: 06/03/2021 7:40 AM End time: 06/03/2021 7:50 AM Injection made incrementally with aspirations every 5 mL.  Performed by: Personally  Anesthesiologist: Murvin Natal, MD  Additional Notes: Functioning IV was confirmed and monitors were applied.  A timeout was performed. Sterile prep, hand hygiene and sterile gloves were used. A 64mm 21ga Arrow echogenic stimulator needle was used. Negative aspiration and negative test dose prior to incremental administration of local anesthetic. The patient tolerated the procedure well.  Ultrasound guidance: relevent anatomy identified, needle position confirmed, local anesthetic spread visualized around nerve(s), vascular puncture avoided.  Image printed for medical record.

## 2021-06-04 ENCOUNTER — Encounter (HOSPITAL_BASED_OUTPATIENT_CLINIC_OR_DEPARTMENT_OTHER): Payer: Self-pay | Admitting: General Surgery

## 2021-06-05 LAB — SURGICAL PATHOLOGY

## 2021-06-06 ENCOUNTER — Telehealth: Payer: Self-pay | Admitting: *Deleted

## 2021-06-06 ENCOUNTER — Encounter: Payer: Self-pay | Admitting: *Deleted

## 2021-06-06 ENCOUNTER — Ambulatory Visit: Payer: Self-pay | Admitting: Physical Therapy

## 2021-06-06 NOTE — Telephone Encounter (Signed)
Ordered oncotype per Dr. Gudena. Faxed requisition to pathology. °

## 2021-06-12 NOTE — Progress Notes (Signed)
Patient Care Team: Lorrene Reid, PA-C as PCP - General (Physician Assistant) Gala Romney, Cristopher Estimable, MD as Consulting Physician (Gastroenterology) Leandrew Koyanagi, MD as Attending Physician (Orthopedic Surgery) Hurley Cisco, MD as Consulting Physician (Rheumatology) Mauro Kaufmann, RN as Oncology Nurse Navigator Rockwell Germany, RN as Oncology Nurse Navigator Jovita Kussmaul, MD as Consulting Physician (General Surgery) Nicholas Lose, MD as Consulting Physician (Hematology and Oncology) Kyung Rudd, MD as Consulting Physician (Radiation Oncology) Alda Berthold, DO as Consulting Physician (Neurology)  DIAGNOSIS:    ICD-10-CM   1. Malignant neoplasm of upper-outer quadrant of right breast in female, estrogen receptor positive ([place])  C50.411    Z17.0       SUMMARY OF ONCOLOGIC HISTORY: Oncology History  Malignant neoplasm of upper-outer quadrant of right breast in female, estrogen receptor positive ([place])  03/18/2021 Initial Diagnosis   Screening mammogram: a possible mass and distortion in the right breast, Diagnostic mammogram: highly suspicious 0.9 cm upper outer right breast mass. Biopsy: Grade 1 IDC with DCIS ER 100%, PR 1%, HER2 negative by [facility]   03/27/2021 Cancer Staging   Staging form: Breast, AJCC 8th Edition - Clinical stage from 03/27/2021: Stage IA (cT1b, cN0, cM0, G1, ER+, PR+, HER2-) - Signed by Nicholas Lose, MD on 03/27/2021 Stage prefix: Initial diagnosis Histologic grading system: 3 grade system     Genetic Testing   [facility] CustomNext Panel (47 genes) was Negative. Report date is 04/09/2021.  The CustomNext-Cancer+RNAinsight panel offered by [facility] includes sequencing and rearrangement analysis for the following 47 genes:  APC, ATM, AXIN2, BARD1, BMPR1A, BRCA1, BRCA2, BRIP1, CDH1, CDK4, CDKN2A, CHEK2, CTNNA1, DICER1, EPCAM, GREM1, HOXB13, KIT, MEN1, MLH1, MSH2, MSH3, MSH6, MUTYH, NBN, NF1, NTHL1, PALB2, PDGFRA, PMS2, POLD1, POLE, PTEN, RAD50, RAD51C,  RAD51D, SDHA, SDHB, SDHC, SDHD, SMAD4, SMARCA4, STK11, TP53, TSC1, TSC2, and VHL.  RNA data is routinely analyzed for use in variant interpretation for all genes.     CHIEF COMPLIANT: Follow-up of right breast cancer  INTERVAL HISTORY: [name] is a 61 y.o. with above-mentioned history of right breast cancer. Right lumpectomy on 06/03/2021 showed invasive ductal carcinoma and DCIS with 2 right axillary lymph nodes negative for carcinoma. She presents to the clinic today for follow-up.  She is complaining of burning discomfort in the right breast on the lateral aspect since her surgery.  It is also extremely bruised.  ALLERGIES:  is allergic to tramadol.  MEDICATIONS:  Current Outpatient Medications  Medication Sig Dispense Refill   FLUoxetine (PROZAC) 20 MG tablet Take 1.5 tablets (30 mg total) by mouth daily. Take 1.5 tablet by mouth daily. 135 tablet 1   hydrochlorothiazide (HYDRODIURIL) 50 MG tablet Take 1 tablet (50 mg total) by mouth daily. 90 tablet 1   HYDROcodone-acetaminophen (NORCO/VICODIN) 5-325 MG tablet Take 1 tablet by mouth every 6 (six) hours as needed for moderate pain or severe pain. 10 tablet 0   lisinopril (ZESTRIL) 40 MG tablet Take 1 tablet (40 mg total) by mouth daily. 90 tablet 1   metoprolol succinate (TOPROL-XL) 25 MG 24 hr tablet Take 1 tablet by mouth once daily 90 tablet 0   OXYGEN Inhale into the lungs. At home at oxygen, uses rarely     No current facility-administered medications for this visit.    PHYSICAL EXAMINATION: ECOG PERFORMANCE STATUS: 1 - Symptomatic but completely ambulatory  Vitals:   06/13/21 1136  BP: 135/66  Pulse: (!) 58  SpO2: 98%   Filed Weights   06/13/21 1136  Weight: 219 lb 9.6 oz (99.6 kg)      LABORATORY DATA:  I have reviewed the data as listed CMP Latest Ref Rng & Units 05/30/2021 03/27/2021 12/18/2020  Glucose 70 - 99 mg/dL 94 101(H) 145(H)  BUN 6 - 20 mg/dL _0 Creatinine 0.44 - 1.00 mg/dL 1.18(H) 1.05(H)  1.43(H)  Sodium 135 - 145 mmol/L 139 137 136  Potassium 3.5 - 5.1 mmol/L 4.1 4.3 4.0  Chloride 98 - 111 mmol/L 98 101 100  CO2 22 - 32 mmol/L _1 Calcium 8.9 - 10.3 mg/dL 9.9 9.6 9.1  Total Protein 6.5 - 8.1 g/dL - 6.9 -  Total Bilirubin 0.3 - 1.2 mg/dL - 0.4 -  Alkaline Phos 38 - 126 U/L - 80 -  AST 15 - 41 U/L - 20 -  ALT 0 - 44 U/L - 17 -    Lab Results  Component Value Date   WBC 7.6 03/27/2021   HGB 10.9 (L) 03/27/2021   HCT 33.9 (L) 03/27/2021   MCV 87.4 03/27/2021   PLT 421 (H) 03/27/2021   NEUTROABS 3.7 03/27/2021    ASSESSMENT & PLAN:  Malignant neoplasm of upper-outer quadrant of right breast in female, estrogen receptor positive ([place]) 03/18/2021:Screening mammogram: a possible mass and distortion in the right breast, Diagnostic mammogram: highly suspicious 0.9 cm upper outer right breast mass. Biopsy: Grade 1 IDC with DCIS ER 100%, PR 1%, HER2 negative by [facility] 06/03/2021: Right lumpectomy: IDC with DCIS 1.7 cm, margins negative, ALH, 0/2 lymph nodes negative ER 100%, PR 100%, HER2 equivocal by IHC negative by FISH ratio 1.28, Ki-67 5%  Pathology counseling: I discussed the final pathology report of the patient provided  a copy of this report. I discussed the margins as well as lymph node surgeries. We also discussed the final staging along with previously performed ER/PR and HER-2/neu testing.  Treatment plan: 1.  Oncotype DX testing to determine if chemotherapy would be of any benefit 2. adjuvant radiation therapy 3.  Adjuvant antiestrogen therapy  Return to clinic based upon Oncotype DX test result    No orders of the defined types were placed in this encounter.  The patient has a good understanding of the overall plan. she agrees with it. she will call with any problems that may develop before the next visit here.  Total time spent: 30 mins including face to face time and time spent for planning, charting and coordination of care  Rulon Eisenmenger, MD,  MPH 06/13/2021  I, Thana Ates, am acting as scribe for Dr. Nicholas Lose.  I have reviewed the above documentation for accuracy and completeness, and I agree with the above.

## 2021-06-13 ENCOUNTER — Other Ambulatory Visit: Payer: Self-pay

## 2021-06-13 ENCOUNTER — Inpatient Hospital Stay: Payer: Medicare Other | Attending: Hematology and Oncology | Admitting: Hematology and Oncology

## 2021-06-13 DIAGNOSIS — Z79899 Other long term (current) drug therapy: Secondary | ICD-10-CM | POA: Insufficient documentation

## 2021-06-13 DIAGNOSIS — C50411 Malignant neoplasm of upper-outer quadrant of right female breast: Secondary | ICD-10-CM | POA: Insufficient documentation

## 2021-06-13 DIAGNOSIS — Z79811 Long term (current) use of aromatase inhibitors: Secondary | ICD-10-CM | POA: Insufficient documentation

## 2021-06-13 DIAGNOSIS — Z170 Estrogen receptor positive status [ER+]: Secondary | ICD-10-CM | POA: Insufficient documentation

## 2021-06-13 DIAGNOSIS — Z9221 Personal history of antineoplastic chemotherapy: Secondary | ICD-10-CM | POA: Diagnosis not present

## 2021-06-13 DIAGNOSIS — Z923 Personal history of irradiation: Secondary | ICD-10-CM | POA: Insufficient documentation

## 2021-06-13 NOTE — Assessment & Plan Note (Signed)
03/18/2021:Screening mammogram: a possible mass and distortion in the right breast, Diagnostic mammogram: highly suspicious 0.9 cm upper outer right breast mass. Biopsy: Grade 1 IDC with DCIS ER 100%, PR 1%, HER2 negative by [facility] 06/03/2021: Right lumpectomy: IDC with DCIS 1.7 cm, margins negative, ALH, 0/2 lymph nodes negative ER 100%, PR 100%, HER2 equivocal by IHC negative by FISH ratio 1.28, Ki-67 5%  Pathology counseling: I discussed the final pathology report of the patient provided  a copy of this report. I discussed the margins as well as lymph node surgeries. We also discussed the final staging along with previously performed ER/PR and HER-2/neu testing.  Treatment plan: 1.  Oncotype DX testing to determine if chemotherapy would be of any benefit 2. adjuvant radiation therapy 3.  Adjuvant antiestrogen therapy  Return to clinic based upon Oncotype DX test result

## 2021-06-14 ENCOUNTER — Telehealth: Payer: Self-pay | Admitting: *Deleted

## 2021-06-14 ENCOUNTER — Encounter: Payer: Self-pay | Admitting: *Deleted

## 2021-06-14 DIAGNOSIS — C50411 Malignant neoplasm of upper-outer quadrant of right female breast: Secondary | ICD-10-CM

## 2021-06-14 DIAGNOSIS — Z170 Estrogen receptor positive status [ER+]: Secondary | ICD-10-CM

## 2021-06-14 NOTE — Telephone Encounter (Signed)
Received oncotype results of 7/3%. Patient is aware. Referral placed for Dr.Moody

## 2021-06-21 ENCOUNTER — Encounter: Payer: Self-pay | Admitting: *Deleted

## 2021-06-25 ENCOUNTER — Encounter (HOSPITAL_COMMUNITY): Payer: Self-pay

## 2021-06-27 ENCOUNTER — Ambulatory Visit: Payer: Self-pay | Admitting: Physical Therapy

## 2021-06-28 ENCOUNTER — Telehealth: Payer: Self-pay | Admitting: Radiation Oncology

## 2021-06-28 NOTE — Telephone Encounter (Signed)
Called patient to reschedule appointment w. Dr. Lisbeth Renshaw. No answer, LVM for a return call.

## 2021-07-01 NOTE — Progress Notes (Signed)
Radiation Oncology         [phone] ________________________________  Name: [name]        MRN: [id number]  Date of Service: 07/03/2021 DOB: Jan 10, 1961  NW:[id number], [name], PA-C  Nicholas Lose, MD     REFERRING PHYSICIAN: Nicholas Lose, MD   DIAGNOSIS: The encounter diagnosis was Malignant neoplasm of upper-outer quadrant of right breast in female, estrogen receptor positive ([place]).   HISTORY OF PRESENT ILLNESS: [name] is a 61 y.o. female originally seen in the multidisciplinary breast clinic for a new diagnosis of right breast cancer. The patient was noted to have screening detected mass during a work up of a paraesophageal hernia that she ended up having surgical repair of. Further diagnostic imaging showed mass and distortion in the right breast measuring 9 mm  in the 10 o'clock position, her right axilla was negative for adenopathy.  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    Since her last visit, the patient has undergone right breast lumpectomy with sentinel node biopsy on 06/03/2021.  Final pathology showed a grade 1 invasive ductal carcinoma measuring 1.7 cm.  Margins were negative for disease, and additional tissue showed ALH and columnar cell hyperplasia with calcifications.  2 sentinel lymph nodes were negative for disease.  She had Oncotype DX score performed which revealed a score of 7 and no chemotherapy was recommended.  She is seen to discuss adjuvant radiotherapy.    PREVIOUS RADIATION THERAPY: No   PAST MEDICAL HISTORY:  Past Medical History:  Diagnosis Date   Acid reflux    takes Zantac and Omeprazole daily   Anemia    Anxiety    takes Citaopram daily   Arthritis    right knee   Arthrosis    left thumb CMC   Chronic back pain    DDD   Clotting disorder (HCC)    hx of blood clot following knee scope   Depression    Fibromyalgia    History  of bronchitis 3+yrs ago   Hyperlipidemia    takes Atorvastatin daily   Hypertension    takes Lisinopril and HCTZ daily   Insomnia    takes Elavil nightly as needed   Paraesophageal hernia        PAST SURGICAL HISTORY: Past Surgical History:  Procedure Laterality Date   BREAST BIOPSY Left    BREAST LUMPECTOMY WITH RADIOACTIVE SEED AND SENTINEL LYMPH NODE BIOPSY Right 06/03/2021   Procedure: RIGHT BREAST LUMPECTOMY WITH RADIOACTIVE SEED AND SENTINEL LYMPH NODE BIOPSY;  Surgeon: Jovita Kussmaul, MD;  Location: [place];  Service: General;  Laterality: Right;   BUNIONECTOMY Right 02/18/2018   Procedure: Yehuda Budd;  Surgeon: Edrick Kins, DPM;  Location: [place];  Service: Podiatry;  Laterality: Right;   CAPSULOTOMY Bilateral 02/18/2018   Procedure: CAPSULOTOMY MPJ RELEASE JOINT 2N BILATERAL;  Surgeon: Edrick Kins, DPM;  Location: [place];  Service: Podiatry;  Laterality: Bilateral;   CARPOMETACARPEL SUSPENSION PLASTY Left 01/27/2018   Procedure: LEFT THUMB ligament reconstruction and tendon interposition;  Surgeon: Leandrew Koyanagi, MD;  Location: [place];  Service: Orthopedics;  Laterality: Left;   CARPOMETACARPEL SUSPENSION PLASTY Left 03/07/2020   Procedure: REVISION LEFT THUMB CARPOMETACARPAL ([place]) ARTHROPLASTY;  Surgeon: Leandrew Koyanagi, MD;  Location: [place];  Service: Orthopedics;  Laterality: Left;   CHONDROPLASTY Right 06/28/2014  Procedure: CHONDROPLASTY;  Surgeon: Marianna Payment, MD;  Location: [place];  Service: Orthopedics;  Laterality: Right;   COLONOSCOPY N/A 05/29/2014   Procedure: COLONOSCOPY;  Surgeon: Daneil Dolin, MD;  [facility];  Service: Endoscopy;  Laterality: N/A;  215pm- Pt is working until 12:00 so she can't come any earlier   ESOPHAGOGASTRODUODENOSCOPY N/A 05/29/2014   Procedure: ESOPHAGOGASTRODUODENOSCOPY (EGD);  Surgeon: Daneil Dolin, MD;  [facility];   Service: Endoscopy;  Laterality: N/A;   ESOPHAGOGASTRODUODENOSCOPY N/A 12/17/2020   Procedure: ESOPHAGOGASTRODUODENOSCOPY (EGD);  Surgeon: Lajuana Matte, MD;  Location: [place];  Service: Thoracic;  Laterality: N/A;   GANGLION CYST EXCISION Left 01/13/2002   HAMMER TOE SURGERY Bilateral 02/18/2018   Procedure: HAMMER TOE CORRECTION2ND BILATERAL;  Surgeon: Edrick Kins, DPM;  Location: [place];  Service: Podiatry;  Laterality: Bilateral;   HERNIA REPAIR     KNEE ARTHROSCOPY WITH MEDIAL MENISECTOMY Right 06/28/2014   Procedure: RIGHT KNEE ARTHROSCOPY WITH PARTIAL MEDIAL MENISCECTOMY AND CHONDROPLASTY;  Surgeon: Marianna Payment, MD;  Location: [place];  Service: Orthopedics;  Laterality: Right;   MALONEY DILATION N/A 05/29/2014   Procedure: Venia Minks DILATION;  Surgeon: Daneil Dolin, MD;  [facility];  Service: Endoscopy;  Laterality: N/A;   PARTIAL KNEE ARTHROPLASTY Right 09/15/2014   Procedure: RIGHT UNICOMPARTMENTAL KNEE ARTHROPLASTY;  Surgeon: Leandrew Koyanagi, MD;  Location: [place];  Service: Orthopedics;  Laterality: Right;   SHOULDER ARTHROSCOPY Right    TOTAL KNEE ARTHROPLASTY Left 04/08/2004   XI ROBOTIC ASSISTED PARAESOPHAGEAL HERNIA REPAIR N/A 12/17/2020   Procedure: XI ROBOTIC ASSISTED Laparoscopy PARAESOPHAGEAL HERNIA REPAIR WITH FUNDOPLICATION;  Surgeon: Lajuana Matte, MD;  Location[facility];  Service: Thoracic;  Laterality: N/A;     FAMILY HISTORY:  Family History  Problem Relation Age of Onset   Stroke Mother    Lung cancer Father 27   GI problems Father    Prostate cancer Father    Cancer Sister        breast   Breast cancer Sister 58   Lung cancer Maternal Grandfather    Lung cancer Paternal Grandfather    Diabetes Son    Colon cancer Neg Hx    Esophageal cancer Neg Hx    Pancreatic cancer Neg Hx      SOCIAL HISTORY:  reports that she has never smoked. She has never used smokeless tobacco. She reports that she does not drink  alcohol and does not use drugs.  The patient is divorced and lives in [place]. She works part time at [employer] to get out of the house and socialize.   ALLERGIES: Tramadol   MEDICATIONS:  Current Outpatient Medications  Medication Sig Dispense Refill   FLUoxetine (PROZAC) 20 MG tablet Take 1.5 tablets (30 mg total) by mouth daily. Take 1.5 tablet by mouth daily. 135 tablet 1   hydrochlorothiazide (HYDRODIURIL) 50 MG tablet Take 1 tablet (50 mg total) by mouth daily. 90 tablet 1   HYDROcodone-acetaminophen (NORCO/VICODIN) 5-325 MG tablet Take 1 tablet by mouth every 6 (six) hours as needed for moderate pain or severe pain. 10 tablet 0   lisinopril (ZESTRIL) 40 MG tablet Take 1 tablet (40 mg total) by mouth daily. 90 tablet 1   metoprolol succinate (TOPROL-XL) 25 MG 24 hr tablet Take 1 tablet by mouth once daily 90 tablet 0   OXYGEN Inhale into the lungs. At home at oxygen, uses rarely  No current facility-administered medications for this visit.     REVIEW OF SYSTEMS: On review of systems, the patient reports that she is doing well overall. She was concerned that she ruptured a few stitches last week but her skin did not open. She denies any redness, fevers, or separation/drainage of her incision site.     PHYSICAL EXAM:  Wt Readings from Last 3 Encounters:  06/13/21 219 lb 9.6 oz (99.6 kg)  06/03/21 213 lb 3 oz (96.7 kg)  03/28/21 215 lb (97.5 kg)   Temp Readings from Last 3 Encounters:  06/03/21 (!) 97.1 F (36.2 C)  03/27/21 97.9 F (36.6 C) (Tympanic)  12/19/20 98.7 F (37.1 C) (Oral)   BP Readings from Last 3 Encounters:  06/13/21 135/66  06/03/21 (!) 117/96  03/28/21 128/85   Pulse Readings from Last 3 Encounters:  06/13/21 (!) 58  06/03/21 63  03/28/21 68    In general this is a well appearing Caucasian female in no acute distress. She's alert and oriented x4 and appropriate throughout the examination. Cardiopulmonary assessment is  negative for acute distress and she exhibits normal effort. Her right breast has a well-healed lumpectomy surgical site without erythema separation or drainage. There is mild induration without fluctuance deep to the well healed incision.     ECOG = 1  0 - Asymptomatic (Fully active, able to carry on all predisease activities without restriction)  1 - Symptomatic but completely ambulatory (Restricted in physically strenuous activity but ambulatory and able to carry out work of a light or sedentary nature. For example, light housework, office work)  2 - Symptomatic, <50% in bed during the day (Ambulatory and capable of all self care but unable to carry out any work activities. Up and about more than 50% of waking hours)  3 - Symptomatic, >50% in bed, but not bedbound (Capable of only limited self-care, confined to bed or chair 50% or more of waking hours)  4 - Bedbound (Completely disabled. Cannot carry on any self-care. Totally confined to bed or chair)  5 - Death   Eustace Pen MM, Creech RH, Tormey DC, et al. [phone]). "Toxicity and response criteria of the [facility]". [place] Oncol. 5 (6): 649-55    LABORATORY DATA:  Lab Results  Component Value Date   WBC 7.6 03/27/2021   HGB 10.9 (L) 03/27/2021   HCT 33.9 (L) 03/27/2021   MCV 87.4 03/27/2021   PLT 421 (H) 03/27/2021   Lab Results  Component Value Date   NA 139 05/30/2021   K 4.1 05/30/2021   CL 98 05/30/2021   CO2 29 05/30/2021   Lab Results  Component Value Date   ALT 17 03/27/2021   AST 20 03/27/2021   ALKPHOS 80 03/27/2021   BILITOT 0.4 03/27/2021      RADIOGRAPHY: MM Breast Surgical Specimen  Result Date: 06/03/2021 CLINICAL DATA:  Right breast cancer status lumpectomy EXAM: SPECIMEN RADIOGRAPH OF THE RIGHT BREAST COMPARISON:  Previous exam(s). FINDINGS: Status post excision of the right breast. The radioactive seed and biopsy marker clip are present, completely intact, and were marked for  pathology. IMPRESSION: Specimen radiograph of the right breast. Electronically Signed   By: Abelardo Diesel M.D.   On: 06/03/2021 09:12      IMPRESSION/PLAN: 1. Stage IA, pT1bN0M0 grade 1, ER/PR positive invasive ductal carcinoma of the right breast. I reviewed the final pathology findings and reviews the nature of right breast disease.  She has done well since surgery.  Dr. Lisbeth Renshaw does recommend external radiotherapy to the breast  to reduce risks of local recurrence followed by antiestrogen therapy. We discussed the risks, benefits, short, and long term effects of radiotherapy, as well as the curative intent, and the patient is interested in proceeding.  We discussed the delivery and logistics of radiotherapy and that Dr. Lisbeth Renshaw recommends weeks of radiotherapy to the right breast. Written consent is obtained and placed in the chart, a copy was provided to the patient.  She will simulate today. 2. Rheumatoid arthritis.  The patient is managed by Dr. Benjamine Mola but does not take methotrexate.  She is aware of the need to avoid this medication in the midst of radiotherapy.    In a visit lasting 45 minutes, greater than 50% of the time was spent face to face reviewing her case, as well as in preparation of, discussing, and coordinating the patient's care.      Carola Rhine, [facility]    **Disclaimer: This note was dictated with voice recognition software. Similar sounding words can inadvertently be transcribed and this note may contain transcription errors which may not have been corrected upon publication of note.**

## 2021-07-02 ENCOUNTER — Ambulatory Visit: Payer: Medicare Other

## 2021-07-02 ENCOUNTER — Telehealth: Payer: Self-pay | Admitting: Radiation Oncology

## 2021-07-02 ENCOUNTER — Ambulatory Visit: Payer: Medicare Other | Admitting: Radiation Oncology

## 2021-07-02 ENCOUNTER — Telehealth: Payer: Self-pay

## 2021-07-02 NOTE — Telephone Encounter (Signed)
Returned the call of patient's VM that was left 07/01/21, concerning appointment scheduled 07/03/21 w. Bryson Ha. No answer, LVM for a return call.

## 2021-07-02 NOTE — Telephone Encounter (Signed)
Called patient x2 and left a voicemail reminder of her 2:00pm-07/03/21 in-person appointment w/ Shona Simpson PA-C. I advised patient to arrive 69min early for check-in and I left my extension [phone] in case patient has any questions.

## 2021-07-03 ENCOUNTER — Ambulatory Visit
Admission: RE | Admit: 2021-07-03 | Discharge: 2021-07-03 | Disposition: A | Discharge status: Home / Self Care | Payer: Medicare Other | Source: Ambulatory Visit | Attending: Radiation Oncology | Admitting: Radiation Oncology

## 2021-07-03 ENCOUNTER — Encounter: Payer: Self-pay | Admitting: Radiation Oncology

## 2021-07-03 ENCOUNTER — Other Ambulatory Visit: Payer: Self-pay

## 2021-07-03 VITALS — BP 142/76 | HR 68 | Temp 97.8°F | Resp 20 | Ht 63.5 in | Wt 222.2 lb

## 2021-07-03 DIAGNOSIS — M129 Arthropathy, unspecified: Secondary | ICD-10-CM | POA: Diagnosis not present

## 2021-07-03 DIAGNOSIS — Z803 Family history of malignant neoplasm of breast: Secondary | ICD-10-CM | POA: Insufficient documentation

## 2021-07-03 DIAGNOSIS — Z79899 Other long term (current) drug therapy: Secondary | ICD-10-CM | POA: Diagnosis not present

## 2021-07-03 DIAGNOSIS — C50411 Malignant neoplasm of upper-outer quadrant of right female breast: Secondary | ICD-10-CM

## 2021-07-03 DIAGNOSIS — I10 Essential (primary) hypertension: Secondary | ICD-10-CM | POA: Diagnosis not present

## 2021-07-03 DIAGNOSIS — K219 Gastro-esophageal reflux disease without esophagitis: Secondary | ICD-10-CM | POA: Diagnosis not present

## 2021-07-03 DIAGNOSIS — Z510 Encounter for antineoplastic radiation therapy: Secondary | ICD-10-CM | POA: Insufficient documentation

## 2021-07-03 DIAGNOSIS — Z801 Family history of malignant neoplasm of trachea, bronchus and lung: Secondary | ICD-10-CM | POA: Insufficient documentation

## 2021-07-03 DIAGNOSIS — M069 Rheumatoid arthritis, unspecified: Secondary | ICD-10-CM | POA: Diagnosis not present

## 2021-07-03 DIAGNOSIS — G4700 Insomnia, unspecified: Secondary | ICD-10-CM | POA: Diagnosis not present

## 2021-07-03 DIAGNOSIS — E785 Hyperlipidemia, unspecified: Secondary | ICD-10-CM | POA: Diagnosis not present

## 2021-07-03 DIAGNOSIS — M797 Fibromyalgia: Secondary | ICD-10-CM | POA: Insufficient documentation

## 2021-07-03 DIAGNOSIS — Z8042 Family history of malignant neoplasm of prostate: Secondary | ICD-10-CM | POA: Diagnosis not present

## 2021-07-03 DIAGNOSIS — Z170 Estrogen receptor positive status [ER+]: Secondary | ICD-10-CM | POA: Insufficient documentation

## 2021-07-03 DIAGNOSIS — F419 Anxiety disorder, unspecified: Secondary | ICD-10-CM | POA: Insufficient documentation

## 2021-07-03 NOTE — Progress Notes (Signed)
Spoke w/ patient, verified identity, and began nursing interview. Patient reports no pain in relation to RT breast but is experiencing insomnia, fatigue, and overall joint pain 8/10, due to fibromyalgia. No other issues reported at this time.  Meaningful use complete. Postmenopausal- No chances of pregnancy -per patient.  BP (!) 142/76 (BP Location: Right Arm, Patient Position: Sitting, Cuff Size: Large)    Pulse 68    Temp 97.8 F (36.6 C) (Temporal)    Resp 20    Ht 5' 3.5" (1.613 m)    Wt 222 lb 3.2 oz (100.8 kg)    LMP 02/07/2013    SpO2 100%    BMI 38.74 kg/m

## 2021-07-09 ENCOUNTER — Encounter: Payer: Self-pay | Admitting: *Deleted

## 2021-07-10 ENCOUNTER — Ambulatory Visit
Admission: RE | Admit: 2021-07-10 | Discharge: 2021-07-10 | Disposition: A | Discharge status: Home / Self Care | Payer: Medicare Other | Source: Ambulatory Visit | Attending: Radiation Oncology | Admitting: Radiation Oncology

## 2021-07-10 ENCOUNTER — Other Ambulatory Visit: Payer: Self-pay

## 2021-07-10 DIAGNOSIS — Z510 Encounter for antineoplastic radiation therapy: Secondary | ICD-10-CM | POA: Diagnosis not present

## 2021-07-10 DIAGNOSIS — C50411 Malignant neoplasm of upper-outer quadrant of right female breast: Secondary | ICD-10-CM | POA: Diagnosis not present

## 2021-07-10 DIAGNOSIS — Z170 Estrogen receptor positive status [ER+]: Secondary | ICD-10-CM | POA: Diagnosis not present

## 2021-07-11 ENCOUNTER — Ambulatory Visit
Admission: RE | Admit: 2021-07-11 | Discharge: 2021-07-11 | Disposition: A | Discharge status: Home / Self Care | Payer: Medicare Other | Source: Ambulatory Visit | Attending: Radiation Oncology | Admitting: Radiation Oncology

## 2021-07-11 DIAGNOSIS — C50411 Malignant neoplasm of upper-outer quadrant of right female breast: Secondary | ICD-10-CM | POA: Diagnosis not present

## 2021-07-12 ENCOUNTER — Other Ambulatory Visit: Payer: Self-pay

## 2021-07-12 ENCOUNTER — Ambulatory Visit
Admission: RE | Admit: 2021-07-12 | Discharge: 2021-07-12 | Disposition: A | Discharge status: Home / Self Care | Payer: Medicare Other | Source: Ambulatory Visit | Attending: Radiation Oncology | Admitting: Radiation Oncology

## 2021-07-12 DIAGNOSIS — C50411 Malignant neoplasm of upper-outer quadrant of right female breast: Secondary | ICD-10-CM

## 2021-07-12 DIAGNOSIS — Z170 Estrogen receptor positive status [ER+]: Secondary | ICD-10-CM

## 2021-07-12 MED ORDER — RADIAPLEXRX EX GEL: Freq: Once | CUTANEOUS | Status: AC

## 2021-07-12 NOTE — Progress Notes (Signed)
Pt here for patient teaching.   ° °Pt given Radiation and You booklet, skin care instructions, and Radiaplex gel.   ° °Reviewed areas of pertinence such as fatigue, hair loss, skin changes, breast tenderness, and breast swelling .  ° °Pt able to give teach back of to pat skin and use unscented/gentle soap,apply Radiaplex bid, avoid applying anything to skin within 4 hours of treatment, avoid wearing an under wire bra, and to use an electric razor if they must shave.  ° °Pt verbalizes understanding of information given and will contact nursing with any questions or concerns.   ° ° ° ° ° °  ° ° ° °  ° °  ° ° ° °  °

## 2021-07-15 ENCOUNTER — Ambulatory Visit
Admission: RE | Admit: 2021-07-15 | Discharge: 2021-07-15 | Disposition: A | Discharge status: Home / Self Care | Payer: Medicare Other | Source: Ambulatory Visit | Attending: Radiation Oncology | Admitting: Radiation Oncology

## 2021-07-15 ENCOUNTER — Other Ambulatory Visit: Payer: Self-pay

## 2021-07-15 DIAGNOSIS — C50411 Malignant neoplasm of upper-outer quadrant of right female breast: Secondary | ICD-10-CM | POA: Diagnosis not present

## 2021-07-16 ENCOUNTER — Ambulatory Visit
Admission: RE | Admit: 2021-07-16 | Discharge: 2021-07-16 | Disposition: A | Discharge status: Home / Self Care | Payer: Medicare Other | Source: Ambulatory Visit | Attending: Radiation Oncology | Admitting: Radiation Oncology

## 2021-07-16 DIAGNOSIS — C50411 Malignant neoplasm of upper-outer quadrant of right female breast: Secondary | ICD-10-CM | POA: Diagnosis not present

## 2021-07-17 ENCOUNTER — Encounter (HOSPITAL_COMMUNITY): Payer: Self-pay

## 2021-07-17 ENCOUNTER — Other Ambulatory Visit: Payer: Self-pay

## 2021-07-17 ENCOUNTER — Ambulatory Visit
Admission: RE | Admit: 2021-07-17 | Discharge: 2021-07-17 | Disposition: A | Discharge status: Home / Self Care | Payer: Medicare Other | Source: Ambulatory Visit | Attending: Radiation Oncology | Admitting: Radiation Oncology

## 2021-07-17 DIAGNOSIS — C50411 Malignant neoplasm of upper-outer quadrant of right female breast: Secondary | ICD-10-CM | POA: Diagnosis not present

## 2021-07-18 ENCOUNTER — Ambulatory Visit
Admission: RE | Admit: 2021-07-18 | Discharge: 2021-07-18 | Disposition: A | Discharge status: Home / Self Care | Payer: Medicare Other | Source: Ambulatory Visit | Attending: Radiation Oncology | Admitting: Radiation Oncology

## 2021-07-18 DIAGNOSIS — C50411 Malignant neoplasm of upper-outer quadrant of right female breast: Secondary | ICD-10-CM | POA: Diagnosis not present

## 2021-07-19 ENCOUNTER — Telehealth: Payer: Self-pay | Admitting: Gastroenterology

## 2021-07-19 ENCOUNTER — Ambulatory Visit: Payer: Medicare Other

## 2021-07-19 ENCOUNTER — Telehealth: Payer: Self-pay

## 2021-07-19 ENCOUNTER — Telehealth: Payer: Self-pay | Admitting: *Deleted

## 2021-07-19 ENCOUNTER — Other Ambulatory Visit: Payer: Self-pay

## 2021-07-19 MED ORDER — SUCRALFATE 1 GM/10ML PO SUSP: 1.0000 g | Freq: Three times a day (TID) | ORAL | 0 refills | Status: DC | PRN

## 2021-07-19 MED ORDER — ONDANSETRON 4 MG PO TBDP: 4.0000 mg | ORAL_TABLET | Freq: Two times a day (BID) | ORAL | 1 refills | Status: DC | PRN

## 2021-07-19 NOTE — Telephone Encounter (Addendum)
Spoke with the patient and she states she is having some nausea and vomiting daily.  She is currently receiving radiation treatment to her right breast.  She was informed that her symptoms would not be a side effect of her radiation treatments.  After discussion with the MD she was advised to get in to see her GI doctor or primary care physician.  Patient expressed frustration with ability to get to another specialist office.  She verbalized understanding of the need to follow-up with the correct specialist. ? ?Gloriajean Dell. Leonie Green, BSN  ?

## 2021-07-19 NOTE — Telephone Encounter (Signed)
Spoke with the patient. She has reached out to [place] at the recommendation of her radiation oncology nurse. The patient is complaining of nausea and vomiting.  ?She had hiatal hernia repair last Fall in August. No problems from that. Dr Kipp Brood had her stop PPI. She was doing well. She was diagnosed with breast cancer a couple of months later.  ?She tells me she started her radiation 07/10/21. She began having nausea and vomiting several days later, she thinks around 07/15/21. She states it is a daily occurrence after a radiation treatment. She has tried supportive measures such as toast, ginger ale, or crackers before or after her treatment without relief. She frequently vomits on her ride home from treatment and remain nauseated the rest of the evening. The cycle starts over the next treatment.  ?She did not have radiation today. She does not have any nausea or vomiting today. Patient has eaten a regular diet. She takes her HTN medication and a medication for depression.  ?Please advise. ?

## 2021-07-19 NOTE — Telephone Encounter (Signed)
Pt called and LVM stating, "I need to talk about something that has to do with my radiation but was told to call Dr Orvilla Cornwall office." Attempted to return pt's call, LVM for her to call us back . ?

## 2021-07-19 NOTE — Telephone Encounter (Signed)
Please send Rx for Zofran '4mg'$  ODT twice daily as needed and Carafate suspension 1 gm before meals as needed. If she has persistent nausea and vomiting, she can request [place] or Oncology for phenergan or higher dose Zofran as needed.  ?

## 2021-07-19 NOTE — Telephone Encounter (Signed)
Inbound call from patient. States she was recently diagnosis with breast cancer and went through her first radiation treatment. States since beginning radiation she have been nausea and vomit. She expressed then the radiation is the cause but she states they informed her to call her GI dr as the radiation is not the cause. Patient would like to know what she can do  ?

## 2021-07-19 NOTE — Telephone Encounter (Signed)
Spoke with the patient and explained the plan of care, the use of the medications and how to get back in touch with me. She will call if she acutely worsens or she fails to improve. ?

## 2021-07-22 ENCOUNTER — Ambulatory Visit
Admission: RE | Admit: 2021-07-22 | Discharge: 2021-07-22 | Disposition: A | Discharge status: Home / Self Care | Payer: Medicare Other | Source: Ambulatory Visit | Attending: Radiation Oncology | Admitting: Radiation Oncology

## 2021-07-22 ENCOUNTER — Other Ambulatory Visit: Payer: Self-pay

## 2021-07-22 DIAGNOSIS — C50411 Malignant neoplasm of upper-outer quadrant of right female breast: Secondary | ICD-10-CM | POA: Diagnosis not present

## 2021-07-23 ENCOUNTER — Ambulatory Visit
Admission: RE | Admit: 2021-07-23 | Discharge: 2021-07-23 | Disposition: A | Discharge status: Home / Self Care | Payer: Medicare Other | Source: Ambulatory Visit | Attending: Radiation Oncology | Admitting: Radiation Oncology

## 2021-07-23 DIAGNOSIS — C50411 Malignant neoplasm of upper-outer quadrant of right female breast: Secondary | ICD-10-CM | POA: Diagnosis not present

## 2021-07-24 ENCOUNTER — Other Ambulatory Visit: Payer: Self-pay

## 2021-07-24 ENCOUNTER — Ambulatory Visit
Admission: RE | Admit: 2021-07-24 | Discharge: 2021-07-24 | Disposition: A | Discharge status: Home / Self Care | Payer: Medicare Other | Source: Ambulatory Visit | Attending: Radiation Oncology | Admitting: Radiation Oncology

## 2021-07-24 DIAGNOSIS — C50411 Malignant neoplasm of upper-outer quadrant of right female breast: Secondary | ICD-10-CM | POA: Diagnosis not present

## 2021-07-25 ENCOUNTER — Other Ambulatory Visit: Payer: Self-pay

## 2021-07-25 ENCOUNTER — Ambulatory Visit
Admission: RE | Admit: 2021-07-25 | Discharge: 2021-07-25 | Disposition: A | Discharge status: Home / Self Care | Payer: Medicare Other | Source: Ambulatory Visit | Attending: Radiation Oncology | Admitting: Radiation Oncology

## 2021-07-25 DIAGNOSIS — C50411 Malignant neoplasm of upper-outer quadrant of right female breast: Secondary | ICD-10-CM | POA: Diagnosis not present

## 2021-07-26 ENCOUNTER — Ambulatory Visit: Payer: Medicare Other | Admitting: Radiation Oncology

## 2021-07-26 ENCOUNTER — Ambulatory Visit: Payer: Medicare Other

## 2021-07-28 DIAGNOSIS — C50411 Malignant neoplasm of upper-outer quadrant of right female breast: Secondary | ICD-10-CM | POA: Diagnosis not present

## 2021-07-29 ENCOUNTER — Encounter: Payer: Self-pay | Admitting: Physician Assistant

## 2021-07-29 ENCOUNTER — Ambulatory Visit
Admission: RE | Admit: 2021-07-29 | Discharge: 2021-07-29 | Disposition: A | Discharge status: Home / Self Care | Payer: Medicare Other | Source: Ambulatory Visit | Attending: Radiation Oncology | Admitting: Radiation Oncology

## 2021-07-29 ENCOUNTER — Other Ambulatory Visit: Payer: Self-pay

## 2021-07-29 DIAGNOSIS — C50411 Malignant neoplasm of upper-outer quadrant of right female breast: Secondary | ICD-10-CM | POA: Diagnosis not present

## 2021-07-30 ENCOUNTER — Inpatient Hospital Stay: Payer: Medicare Other | Attending: Hematology and Oncology

## 2021-07-30 ENCOUNTER — Ambulatory Visit
Admission: RE | Admit: 2021-07-30 | Discharge: 2021-07-30 | Disposition: A | Discharge status: Home / Self Care | Payer: Medicare Other | Source: Ambulatory Visit | Attending: Radiation Oncology | Admitting: Radiation Oncology

## 2021-07-30 DIAGNOSIS — Z79811 Long term (current) use of aromatase inhibitors: Secondary | ICD-10-CM | POA: Insufficient documentation

## 2021-07-30 DIAGNOSIS — C50411 Malignant neoplasm of upper-outer quadrant of right female breast: Secondary | ICD-10-CM | POA: Insufficient documentation

## 2021-07-30 DIAGNOSIS — Z170 Estrogen receptor positive status [ER+]: Secondary | ICD-10-CM | POA: Insufficient documentation

## 2021-07-31 ENCOUNTER — Inpatient Hospital Stay: Payer: Medicare Other

## 2021-07-31 ENCOUNTER — Other Ambulatory Visit: Payer: Self-pay

## 2021-07-31 ENCOUNTER — Ambulatory Visit
Admission: RE | Admit: 2021-07-31 | Discharge: 2021-07-31 | Disposition: A | Discharge status: Home / Self Care | Payer: Medicare Other | Source: Ambulatory Visit | Attending: Radiation Oncology | Admitting: Radiation Oncology

## 2021-07-31 DIAGNOSIS — C50411 Malignant neoplasm of upper-outer quadrant of right female breast: Secondary | ICD-10-CM | POA: Diagnosis not present

## 2021-08-01 ENCOUNTER — Ambulatory Visit: Payer: Medicare Other

## 2021-08-01 ENCOUNTER — Inpatient Hospital Stay: Payer: Medicare Other

## 2021-08-01 DIAGNOSIS — C50411 Malignant neoplasm of upper-outer quadrant of right female breast: Secondary | ICD-10-CM | POA: Diagnosis not present

## 2021-08-02 ENCOUNTER — Ambulatory Visit: Payer: Medicare Other

## 2021-08-02 ENCOUNTER — Other Ambulatory Visit: Payer: Self-pay

## 2021-08-02 DIAGNOSIS — C50411 Malignant neoplasm of upper-outer quadrant of right female breast: Secondary | ICD-10-CM | POA: Diagnosis not present

## 2021-08-05 ENCOUNTER — Other Ambulatory Visit: Payer: Self-pay

## 2021-08-05 ENCOUNTER — Ambulatory Visit: Payer: Medicare Other

## 2021-08-05 ENCOUNTER — Encounter: Payer: Self-pay | Admitting: *Deleted

## 2021-08-05 ENCOUNTER — Ambulatory Visit
Admission: RE | Admit: 2021-08-05 | Discharge: 2021-08-05 | Disposition: A | Discharge status: Home / Self Care | Payer: Medicare Other | Source: Ambulatory Visit | Attending: Radiation Oncology | Admitting: Radiation Oncology

## 2021-08-05 ENCOUNTER — Other Ambulatory Visit: Payer: Self-pay | Admitting: Physician Assistant

## 2021-08-05 DIAGNOSIS — Z170 Estrogen receptor positive status [ER+]: Secondary | ICD-10-CM

## 2021-08-05 DIAGNOSIS — I10 Essential (primary) hypertension: Secondary | ICD-10-CM

## 2021-08-05 DIAGNOSIS — C50411 Malignant neoplasm of upper-outer quadrant of right female breast: Secondary | ICD-10-CM | POA: Diagnosis not present

## 2021-08-05 NOTE — Progress Notes (Signed)
? ?Patient Care Team: ?Brandi Reid, PA-C as PCP - General (Physician Assistant) ?Baker, Brandi Estimable, MD as Consulting Physician (Gastroenterology) ?Brandi Koyanagi, MD as Attending Physician (Orthopedic Surgery) ?Brandi Cisco, MD as Consulting Physician (Rheumatology) ?Brandi Kaufmann, RN as Oncology Nurse Navigator ?Brandi Germany, RN as Oncology Nurse Navigator ?Brandi Kussmaul, MD as Consulting Physician (General Surgery) ?Brandi Lose, MD as Consulting Physician (Hematology and Oncology) ?Brandi Rudd, MD as Consulting Physician (Radiation Oncology) ?Brandi Berthold, DO as Consulting Physician (Neurology) ? ?DIAGNOSIS:  ?Encounter Diagnosis  ?Name Primary?  ? Malignant neoplasm of upper-outer quadrant of right breast in female, estrogen receptor positive ([name])   ? ? ?SUMMARY OF ONCOLOGIC HISTORY: ?Oncology History  ?Malignant neoplasm of upper-outer quadrant of right breast in female, estrogen receptor positive ([name])  ?03/18/2021 Initial Diagnosis  ? Screening mammogram: a possible mass and distortion in the right breast, Diagnostic mammogram: highly suspicious 0.9 cm upper outer right breast mass. Biopsy: Grade 1 IDC with DCIS ER 100%, PR 1%, HER2 negative by FISH ?  ?03/27/2021 Cancer Staging  ? Staging form: Breast, AJCC 8th Edition ?- Clinical stage from 03/27/2021: Stage IA (cT1b, cN0, cM0, G1, ER+, PR+, HER2-) - Signed by Brandi Lose, MD on 03/27/2021 ?Stage prefix: Initial diagnosis ?Histologic grading system: 3 grade system ? ?  ? Genetic Testing  ? [facility] CustomNext Panel (47 genes) was Negative. Report date is 04/09/2021. ? ?The CustomNext-Cancer+RNAinsight panel offered by [facility] includes sequencing and rearrangement analysis for the following 47 genes:  APC, ATM, AXIN2, BARD1, BMPR1A, BRCA1, BRCA2, BRIP1, CDH1, CDK4, CDKN2A, CHEK2, CTNNA1, DICER1, EPCAM, GREM1, HOXB13, KIT, MEN1, MLH1, MSH2, MSH3, MSH6, MUTYH, NBN, NF1, NTHL1, PALB2, PDGFRA, PMS2, POLD1, POLE, PTEN, RAD50, RAD51C,  RAD51D, SDHA, SDHB, SDHC, SDHD, SMAD4, SMARCA4, STK11, TP53, TSC1, TSC2, and VHL.  RNA data is routinely analyzed for use in variant interpretation for all genes. ?  ? ? ?CHIEF COMPLIANT: Follow-up of right breast cancer ? ?INTERVAL HISTORY: [name] is a 61 y.o. with above-mentioned history of right breast cancer. Right lumpectomy on 06/03/2021 showed invasive ductal carcinoma and DCIS with 2 right axillary lymph nodes negative for carcinoma. She presents to the clinic today for follow-up. She complains of nausea after radiation. She states that she was very sensitive to it. Complains of burning and itching from it. ? ? ?ALLERGIES:  is allergic to tramadol. ? ?MEDICATIONS:  ?Current Outpatient Medications  ?Medication Sig Dispense Refill  ? [START ON 09/09/2021] letrozole (FEMARA) 2.5 MG tablet Take 1 tablet (2.5 mg total) by mouth daily. 90 tablet 3  ? FLUoxetine (PROZAC) 20 MG tablet Take 1.5 tablets (30 mg total) by mouth daily. Take 1.5 tablet by mouth daily. 135 tablet 1  ? hydrochlorothiazide (HYDRODIURIL) 50 MG tablet Take 1 tablet (50 mg total) by mouth daily. 90 tablet 1  ? HYDROcodone-acetaminophen (NORCO/VICODIN) 5-325 MG tablet Take 1 tablet by mouth every 6 (six) hours as needed for moderate pain or severe pain. 10 tablet 0  ? lisinopril (ZESTRIL) 40 MG tablet Take 1 tablet (40 mg total) by mouth daily. 90 tablet 1  ? metoprolol succinate (TOPROL-XL) 25 MG 24 hr tablet Take 1 tablet (25 mg total) by mouth daily. **PLEASE CONTACT OUR OFFICE TO SCHEDULE A FOLLOW UP FOR FUTURE MED REFILLS** 30 tablet 0  ? ondansetron (ZOFRAN-ODT) 4 MG disintegrating tablet Take 1 tablet (4 mg total) by mouth 2 (two) times daily as needed for nausea or vomiting. 30 tablet 1  ? OXYGEN Inhale  into the lungs. At home at oxygen, uses rarely    ? sucralfate (CARAFATE) 1 GM/10ML suspension Take 10 mLs (1 g total) by mouth 3 (three) times daily as needed. 420 mL 0  ? ?No current facility-administered medications for this visit.   ? ? ?PHYSICAL EXAMINATION: ?ECOG PERFORMANCE STATUS: 1 - Symptomatic but completely ambulatory ? ?Vitals:  ? 08/06/21 1054  ?BP: (!) 141/79  ?Pulse: 75  ?Resp: 17  ?Temp: (!) 97.3 ?F (36.3 ?C)  ?SpO2: 98%  ? ?Filed Weights  ? 08/06/21 1054  ?Weight: 222 lb 4.8 oz (100.8 kg)  ? ?  ? ?LABORATORY DATA:  ?I have reviewed the data as listed ? ?  Latest Ref Rng & Units 05/30/2021  ?  3:26 PM 03/27/2021  ? 12:08 PM 12/18/2020  ? 12:29 AM  ?CMP  ?Glucose 70 - 99 mg/dL 94   101   145    ?BUN 6 - 20 mg/dL '13   17   20    ' ?Creatinine 0.44 - 1.00 mg/dL 1.18   1.05   1.43    ?Sodium 135 - 145 mmol/L 139   137   136    ?Potassium 3.5 - 5.1 mmol/L 4.1   4.3   4.0    ?Chloride 98 - 111 mmol/L 98   101   100    ?CO2 22 - 32 mmol/L '29   28   26    ' ?Calcium 8.9 - 10.3 mg/dL 9.9   9.6   9.1    ?Total Protein 6.5 - 8.1 g/dL  6.9     ?Total Bilirubin 0.3 - 1.2 mg/dL  0.4     ?Alkaline Phos 38 - 126 U/L  80     ?AST 15 - 41 U/L  20     ?ALT 0 - 44 U/L  17     ? ? ?Lab Results  ?Component Value Date  ? WBC 7.6 03/27/2021  ? HGB 10.9 (L) 03/27/2021  ? HCT 33.9 (L) 03/27/2021  ? MCV 87.4 03/27/2021  ? PLT 421 (H) 03/27/2021  ? NEUTROABS 3.7 03/27/2021  ? ? ?ASSESSMENT & PLAN:  ?Malignant neoplasm of upper-outer quadrant of right breast in female, estrogen receptor positive ([name]) ?03/18/2021:Screening mammogram: a possible mass and distortion in the right breast, Diagnostic mammogram: highly suspicious 0.9 cm upper outer right breast mass. 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 ?Current treatment: Adjuvant radiation 07/18/2021-08/08/2021 ?Treatment plan: Adjuvant antiestrogen therapy with letrozole 2.5 mg daily x5 to 7 years to start 09/09/2021 ? ?Letrozole counseling: We discussed  the risks and benefits of anti-estrogen therapy with aromatase inhibitors. These include but not limited to insomnia, hot flashes, mood changes, vaginal dryness, bone density loss, and weight gain. We strongly believe that the benefits far outweigh the risks. Patient understands these risks and consented to starting treatment. Planned treatment duration is 5-7 years. ? ?Return to clinic in 3 months for survivorship care plan visit ? ? ? ?No orders of the defined types were placed in this encounter. ? ?The patient has a good understanding of the overall plan. she agrees with it. she will call with any problems that may develop before the next visit here. ?Total time  spent: 30 mins including face to face time and time spent for planning, charting and co-ordination of care ? ? Harriette Ohara, MD ?08/06/21 ? ? ? I Gardiner Coins am scribing for Dr. Lindi Adie ? ?I have reviewed the above documentation for accuracy and completeness, and I agree with the above. ?  ?

## 2021-08-06 ENCOUNTER — Inpatient Hospital Stay (HOSPITAL_BASED_OUTPATIENT_CLINIC_OR_DEPARTMENT_OTHER): Payer: Medicare Other | Admitting: Hematology and Oncology

## 2021-08-06 ENCOUNTER — Ambulatory Visit: Payer: Medicare Other

## 2021-08-06 ENCOUNTER — Ambulatory Visit
Admission: RE | Admit: 2021-08-06 | Discharge: 2021-08-06 | Disposition: A | Discharge status: Home / Self Care | Payer: Medicare Other | Source: Ambulatory Visit | Attending: Radiation Oncology | Admitting: Radiation Oncology

## 2021-08-06 ENCOUNTER — Inpatient Hospital Stay: Payer: Medicare Other

## 2021-08-06 DIAGNOSIS — Z170 Estrogen receptor positive status [ER+]: Secondary | ICD-10-CM | POA: Diagnosis not present

## 2021-08-06 DIAGNOSIS — C50411 Malignant neoplasm of upper-outer quadrant of right female breast: Secondary | ICD-10-CM | POA: Diagnosis present

## 2021-08-06 DIAGNOSIS — Z79811 Long term (current) use of aromatase inhibitors: Secondary | ICD-10-CM | POA: Diagnosis not present

## 2021-08-06 MED ORDER — LETROZOLE 2.5 MG PO TABS: 2.5000 mg | ORAL_TABLET | Freq: Every day | ORAL | 3 refills | Status: DC

## 2021-08-06 NOTE — Assessment & Plan Note (Addendum)
03/18/2021:Screening mammogram: a possible mass and distortion in the right breast, Diagnostic mammogram: highly suspicious 0.9 cm upper outer right breast mass. 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 ?Current treatment: Adjuvant radiation 07/18/2021-08/08/2021 ?Treatment plan: Adjuvant antiestrogen therapy with letrozole 2.5 mg daily x5 to 7 years to start 09/09/2021 ? ?Letrozole counseling: We discussed the risks and benefits of anti-estrogen therapy with aromatase inhibitors. These include but not limited to insomnia, hot flashes, mood changes, vaginal dryness, bone density loss, and weight gain. We strongly believe that the benefits far outweigh the risks. Patient understands these risks and consented to starting treatment. Planned treatment duration is 5-7 years. ? ?Return to clinic in 3 months for survivorship care plan visit ?

## 2021-08-07 ENCOUNTER — Inpatient Hospital Stay: Payer: Medicare Other

## 2021-08-07 ENCOUNTER — Ambulatory Visit: Payer: Medicare Other

## 2021-08-07 ENCOUNTER — Ambulatory Visit
Admission: RE | Admit: 2021-08-07 | Discharge: 2021-08-07 | Disposition: A | Discharge status: Home / Self Care | Payer: Medicare Other | Source: Ambulatory Visit | Attending: Radiation Oncology | Admitting: Radiation Oncology

## 2021-08-07 ENCOUNTER — Other Ambulatory Visit: Payer: Self-pay

## 2021-08-07 DIAGNOSIS — C50411 Malignant neoplasm of upper-outer quadrant of right female breast: Secondary | ICD-10-CM | POA: Diagnosis not present

## 2021-08-08 ENCOUNTER — Inpatient Hospital Stay: Payer: Medicare Other

## 2021-08-08 ENCOUNTER — Encounter: Payer: Self-pay | Admitting: Radiation Oncology

## 2021-08-08 ENCOUNTER — Ambulatory Visit
Admission: RE | Admit: 2021-08-08 | Discharge: 2021-08-08 | Disposition: A | Discharge status: Home / Self Care | Payer: Medicare Other | Source: Ambulatory Visit | Attending: Radiation Oncology | Admitting: Radiation Oncology

## 2021-08-08 DIAGNOSIS — C50411 Malignant neoplasm of upper-outer quadrant of right female breast: Secondary | ICD-10-CM | POA: Diagnosis not present

## 2021-08-09 ENCOUNTER — Telehealth: Payer: Self-pay | Admitting: Adult Health

## 2021-08-09 NOTE — Telephone Encounter (Signed)
.  Called pt per2/24 inbasket , Patient was unavailable, a message with appt time and date was left with number on file.   ?

## 2021-08-20 NOTE — Progress Notes (Signed)
? ?                                                                                                                                                          ?  Patient Name: [name] ?MRN: [id number] ?DOB: 09-08-1960 ?Referring Physician: Lorrene Reid ?Date of Service: 08/08/2021 ?[facility] [place] ? ?                                                      End Of Treatment Note ? ?Diagnoses: C50.411-Malignant neoplasm of upper-outer quadrant of right female breast ? ?Cancer Staging:  Stage IA, pT1bN0M0 grade 1, ER/PR positive invasive ductal carcinoma of the right breast ? ?Intent: Curative ? ?Radiation Treatment Dates: 07/10/2021 through 08/08/2021 ?Site Technique Total Dose (Gy) Dose per Fx (Gy) Completed Fx Beam Energies  ?Breast, Right: Breast_R 3D 42.56/42.56 2.66 16/16 10X  ?Breast, Right: Breast_R_Bst 3D 8/8 2 4/4 10X  ? ?Narrative: The patient tolerated radiation therapy relatively well. She developed fatigue and anticipated skin changes in the treatment field.  ? ?Plan: The patient will receive a call in about one month from the radiation oncology department. She will continue follow up with Dr. Lindi Adie as well. ? ? ?________________________________________________ ? ? ? ?Carola Rhine, PAC  ?

## 2021-09-03 ENCOUNTER — Telehealth: Payer: Self-pay | Admitting: *Deleted

## 2021-09-03 NOTE — Telephone Encounter (Signed)
Received after hours message from pt with complaint of right sided breast pain, headache, neck pain and fever of 101.0.  RN attempt x1 to contact pt.  No answer, LVM for pt to return call to the office.  ?

## 2021-09-08 ENCOUNTER — Emergency Department (HOSPITAL_BASED_OUTPATIENT_CLINIC_OR_DEPARTMENT_OTHER): Payer: Medicare Other | Admitting: Certified Registered Nurse Anesthetist

## 2021-09-08 ENCOUNTER — Ambulatory Visit (HOSPITAL_COMMUNITY)
Admission: EM | Admit: 2021-09-08 | Discharge: 2021-09-08 | Disposition: A | Discharge status: Home / Self Care | Payer: Medicare Other | Attending: Emergency Medicine | Admitting: Emergency Medicine

## 2021-09-08 ENCOUNTER — Encounter (HOSPITAL_COMMUNITY): Payer: Self-pay | Admitting: Oncology

## 2021-09-08 ENCOUNTER — Encounter (HOSPITAL_COMMUNITY)
Admission: EM | Disposition: A | Discharge status: Home / Self Care | Payer: Self-pay | Source: Home / Self Care | Attending: Emergency Medicine

## 2021-09-08 ENCOUNTER — Emergency Department (HOSPITAL_COMMUNITY): Payer: Medicare Other

## 2021-09-08 ENCOUNTER — Other Ambulatory Visit: Payer: Self-pay

## 2021-09-08 ENCOUNTER — Emergency Department (HOSPITAL_COMMUNITY): Payer: Medicare Other | Admitting: Certified Registered Nurse Anesthetist

## 2021-09-08 DIAGNOSIS — Z6837 Body mass index (BMI) 37.0-37.9, adult: Secondary | ICD-10-CM | POA: Diagnosis not present

## 2021-09-08 DIAGNOSIS — F419 Anxiety disorder, unspecified: Secondary | ICD-10-CM | POA: Insufficient documentation

## 2021-09-08 DIAGNOSIS — Z923 Personal history of irradiation: Secondary | ICD-10-CM | POA: Insufficient documentation

## 2021-09-08 DIAGNOSIS — I10 Essential (primary) hypertension: Secondary | ICD-10-CM | POA: Diagnosis not present

## 2021-09-08 DIAGNOSIS — K219 Gastro-esophageal reflux disease without esophagitis: Secondary | ICD-10-CM | POA: Insufficient documentation

## 2021-09-08 DIAGNOSIS — C50911 Malignant neoplasm of unspecified site of right female breast: Secondary | ICD-10-CM | POA: Diagnosis not present

## 2021-09-08 DIAGNOSIS — E669 Obesity, unspecified: Secondary | ICD-10-CM | POA: Diagnosis not present

## 2021-09-08 DIAGNOSIS — M797 Fibromyalgia: Secondary | ICD-10-CM | POA: Diagnosis not present

## 2021-09-08 DIAGNOSIS — N6489 Other specified disorders of breast: Secondary | ICD-10-CM | POA: Diagnosis not present

## 2021-09-08 DIAGNOSIS — I82409 Acute embolism and thrombosis of unspecified deep veins of unspecified lower extremity: Secondary | ICD-10-CM | POA: Diagnosis not present

## 2021-09-08 DIAGNOSIS — F32A Depression, unspecified: Secondary | ICD-10-CM | POA: Diagnosis not present

## 2021-09-08 DIAGNOSIS — F418 Other specified anxiety disorders: Secondary | ICD-10-CM | POA: Diagnosis not present

## 2021-09-08 DIAGNOSIS — N644 Mastodynia: Secondary | ICD-10-CM

## 2021-09-08 HISTORY — PX: IRRIGATION AND DEBRIDEMENT ABSCESS: SHX5252

## 2021-09-08 LAB — CBC WITH DIFFERENTIAL/PLATELET
Abs Immature Granulocytes: 0.03 10*3/uL (ref 0.00–0.07)
Basophils Absolute: 0.1 10*3/uL (ref 0.0–0.1)
Basophils Relative: 1 %
Eosinophils Absolute: 0.1 10*3/uL (ref 0.0–0.5)
Eosinophils Relative: 1 %
HCT: 38.1 % (ref 36.0–46.0)
Hemoglobin: 12.6 g/dL (ref 12.0–15.0)
Immature Granulocytes: 0 %
Lymphocytes Relative: 20 %
Lymphs Abs: 1.7 10*3/uL (ref 0.7–4.0)
MCH: 30 pg (ref 26.0–34.0)
MCHC: 33.1 g/dL (ref 30.0–36.0)
MCV: 90.7 fL (ref 80.0–100.0)
Monocytes Absolute: 1.1 10*3/uL — ABNORMAL HIGH (ref 0.1–1.0)
Monocytes Relative: 13 %
Neutro Abs: 5.6 10*3/uL (ref 1.7–7.7)
Neutrophils Relative %: 65 %
Platelets: 414 10*3/uL — ABNORMAL HIGH (ref 150–400)
RBC: 4.2 MIL/uL (ref 3.87–5.11)
RDW: 15.1 % (ref 11.5–15.5)
WBC: 8.5 10*3/uL (ref 4.0–10.5)
nRBC: 0 % (ref 0.0–0.2)

## 2021-09-08 LAB — BASIC METABOLIC PANEL
Anion gap: 8 (ref 5–15)
BUN: 19 mg/dL (ref 6–20)
CO2: 29 mmol/L (ref 22–32)
Calcium: 9.8 mg/dL (ref 8.9–10.3)
Chloride: 101 mmol/L (ref 98–111)
Creatinine, Ser: 1.03 mg/dL — ABNORMAL HIGH (ref 0.44–1.00)
GFR, Estimated: >60 mL/min (ref >60)
Glucose, Bld: 99 mg/dL (ref 70–99)
Potassium: 3.9 mmol/L (ref 3.5–5.1)
Sodium: 138 mmol/L (ref 135–145)

## 2021-09-08 SURGERY — IRRIGATION AND DEBRIDEMENT ABSCESS
Anesthesia: General | Laterality: Right

## 2021-09-08 MED ORDER — SODIUM CHLORIDE 0.9 % IR SOLN
Status: DC | PRN
  Administered 2021-09-08: 1000 mL

## 2021-09-08 MED ORDER — PROPOFOL 10 MG/ML IV BOLUS
INTRAVENOUS | Status: DC | PRN
  Administered 2021-09-08: 160 mg via INTRAVENOUS
  Administered 2021-09-08: 40 mg via INTRAVENOUS

## 2021-09-08 MED ORDER — ONDANSETRON HCL 4 MG/2ML IJ SOLN
INTRAMUSCULAR | Status: AC
Start: 2021-09-08 — End: ?
  Filled 2021-09-08: qty 2

## 2021-09-08 MED ORDER — BUPIVACAINE-EPINEPHRINE (PF) 0.25% -1:200000 IJ SOLN
INTRAMUSCULAR | Status: AC
  Filled 2021-09-08: qty 30

## 2021-09-08 MED ORDER — MIDAZOLAM HCL 5 MG/5ML IJ SOLN
INTRAMUSCULAR | Status: DC | PRN
Start: 2021-09-08 — End: 2021-09-08
  Administered 2021-09-08: 2 mg via INTRAVENOUS

## 2021-09-08 MED ORDER — DEXAMETHASONE SODIUM PHOSPHATE 10 MG/ML IJ SOLN
INTRAMUSCULAR | Status: DC | PRN
  Administered 2021-09-08: 10 mg via INTRAVENOUS

## 2021-09-08 MED ORDER — LIDOCAINE HCL (PF) 2 % IJ SOLN
INTRAMUSCULAR | Status: AC
  Filled 2021-09-08: qty 5

## 2021-09-08 MED ORDER — CEFAZOLIN SODIUM-DEXTROSE 2-4 GM/100ML-% IV SOLN
INTRAVENOUS | Status: AC
  Filled 2021-09-08: qty 100

## 2021-09-08 MED ORDER — FENTANYL CITRATE (PF) 100 MCG/2ML IJ SOLN
INTRAMUSCULAR | Status: DC | PRN
  Administered 2021-09-08 (×2): 50 ug via INTRAVENOUS

## 2021-09-08 MED ORDER — PROPOFOL 10 MG/ML IV BOLUS
INTRAVENOUS | Status: AC
Start: 2021-09-08 — End: ?
  Filled 2021-09-08: qty 20

## 2021-09-08 MED ORDER — ACETAMINOPHEN 10 MG/ML IV SOLN
INTRAVENOUS | Status: AC
  Filled 2021-09-08: qty 100

## 2021-09-08 MED ORDER — LIDOCAINE 2% (20 MG/ML) 5 ML SYRINGE
INTRAMUSCULAR | Status: DC | PRN
Start: 2021-09-08 — End: 2021-09-08
  Administered 2021-09-08: 80 mg via INTRAVENOUS

## 2021-09-08 MED ORDER — IOHEXOL 300 MG/ML  SOLN
75.0000 mL | Freq: Once | INTRAMUSCULAR | Status: AC | PRN
  Administered 2021-09-08: 75 mL via INTRAVENOUS

## 2021-09-08 MED ORDER — KETOROLAC TROMETHAMINE 15 MG/ML IJ SOLN
15.0000 mg | Freq: Once | INTRAMUSCULAR | Status: AC
  Administered 2021-09-08: 15 mg via INTRAVENOUS
  Filled 2021-09-08: qty 1

## 2021-09-08 MED ORDER — ONDANSETRON HCL 4 MG/2ML IJ SOLN
INTRAMUSCULAR | Status: DC | PRN
  Administered 2021-09-08: 4 mg via INTRAVENOUS

## 2021-09-08 MED ORDER — ONDANSETRON HCL 4 MG/2ML IJ SOLN: 4.0000 mg | Freq: Once | INTRAMUSCULAR | Status: DC | PRN

## 2021-09-08 MED ORDER — MIDAZOLAM HCL 2 MG/2ML IJ SOLN
INTRAMUSCULAR | Status: AC
  Filled 2021-09-08: qty 2

## 2021-09-08 MED ORDER — FENTANYL CITRATE (PF) 100 MCG/2ML IJ SOLN
INTRAMUSCULAR | Status: AC
  Filled 2021-09-08: qty 2

## 2021-09-08 MED ORDER — SODIUM CHLORIDE (PF) 0.9 % IJ SOLN
INTRAMUSCULAR | Status: AC
  Filled 2021-09-08: qty 50

## 2021-09-08 MED ORDER — CEFAZOLIN SODIUM-DEXTROSE 2-3 GM-%(50ML) IV SOLR
INTRAVENOUS | Status: DC | PRN
  Administered 2021-09-08: 2 g via INTRAVENOUS

## 2021-09-08 MED ORDER — BUPIVACAINE-EPINEPHRINE (PF) 0.25% -1:200000 IJ SOLN
INTRAMUSCULAR | Status: DC | PRN
  Administered 2021-09-08: 30 mL via PERINEURAL

## 2021-09-08 MED ORDER — OXYCODONE HCL 5 MG PO TABS: 5.0000 mg | ORAL_TABLET | Freq: Four times a day (QID) | ORAL | 0 refills | Status: DC | PRN

## 2021-09-08 MED ORDER — LACTATED RINGERS IV SOLN: INTRAVENOUS | Status: DC | PRN

## 2021-09-08 MED ORDER — ACETAMINOPHEN 10 MG/ML IV SOLN
INTRAVENOUS | Status: DC | PRN
  Administered 2021-09-08: 1000 mg via INTRAVENOUS

## 2021-09-08 MED ORDER — FENTANYL CITRATE PF 50 MCG/ML IJ SOSY: 25.0000 ug | PREFILLED_SYRINGE | INTRAMUSCULAR | Status: DC | PRN

## 2021-09-08 MED ORDER — DEXAMETHASONE SODIUM PHOSPHATE 10 MG/ML IJ SOLN
INTRAMUSCULAR | Status: AC
  Filled 2021-09-08: qty 1

## 2021-09-08 SURGICAL SUPPLY — 24 items
APL PRP STRL LF DISP 70% ISPRP (MISCELLANEOUS) ×1
BAG COUNTER SPONGE SURGICOUNT (BAG) IMPLANT
BAG SPNG CNTER NS LX DISP (BAG)
BLADE SURG 15 STRL LF DISP TIS (BLADE) IMPLANT
BLADE SURG 15 STRL SS (BLADE) ×2
CHLORAPREP W/TINT 26 (MISCELLANEOUS) ×1 IMPLANT
COVER SURGICAL LIGHT HANDLE (MISCELLANEOUS) ×3 IMPLANT
DRAIN PENROSE 0.25X18 (DRAIN) ×1 IMPLANT
DRAPE LAPAROTOMY TRNSV 102X78 (DRAPES) ×1 IMPLANT
DRAPE UTILITY XL STRL (DRAPES) ×3 IMPLANT
ELECT PENCIL ROCKER SW 15FT (MISCELLANEOUS) ×1 IMPLANT
GAUZE SPONGE 4X4 12PLY STRL (GAUZE/BANDAGES/DRESSINGS) ×1 IMPLANT
GLOVE BIOGEL PI IND STRL 7.0 (GLOVE) ×2 IMPLANT
GLOVE BIOGEL PI IND STRL 7.5 (GLOVE) ×2 IMPLANT
GLOVE BIOGEL PI INDICATOR 7.0 (GLOVE) ×1
GLOVE BIOGEL PI INDICATOR 7.5 (GLOVE) ×1
GOWN STRL REUS W/ TWL LRG LVL3 (GOWN DISPOSABLE) ×4 IMPLANT
GOWN STRL REUS W/TWL LRG LVL3 (GOWN DISPOSABLE) ×4
KIT BASIN OR (CUSTOM PROCEDURE TRAY) ×1 IMPLANT
PACK BASIC (CUSTOM PROCEDURE TRAY) ×1 IMPLANT
PENCIL SMOKE EVACUATOR (MISCELLANEOUS) IMPLANT
SPONGE T-LAP 18X18 ~~LOC~~+RFID (SPONGE) ×1 IMPLANT
TOWEL OR 17X26 10 PK STRL BLUE (TOWEL DISPOSABLE) ×3 IMPLANT
YANKAUER SUCT BULB TIP 10FT TU (MISCELLANEOUS) ×1 IMPLANT

## 2021-09-08 NOTE — Anesthesia Procedure Notes (Signed)
Procedure Name: LMA Insertion ?Date/Time: 09/08/2021 8:06 PM ?Performed by: Montel Clock, CRNA ?Pre-anesthesia Checklist: Patient identified, Emergency Drugs available, Suction available, Patient being monitored and Timeout performed ?Patient Re-evaluated:Patient Re-evaluated prior to induction ?Oxygen Delivery Method: Circle system utilized ?Preoxygenation: Pre-oxygenation with 100% oxygen ?Induction Type: IV induction ?Ventilation: Mask ventilation without difficulty ?LMA: LMA with gastric port inserted ?LMA Size: 4.0 ?Number of attempts: 1 ?Dental Injury: Teeth and Oropharynx as per pre-operative assessment  ? ? ? ? ?

## 2021-09-08 NOTE — ED Triage Notes (Signed)
Pt c/o painful mass on right breast. Pt has underwent a lumpectomy in January of 2023. Pt developed fever/chills was called in an antibiotic on Tuesday.  Pt reports burning pain to right breast. ?

## 2021-09-08 NOTE — Op Note (Signed)
Preop diagnosis: Right breast infected seroma ?Postop diagnosis: Same ?Procedure performed: Incision and drainage of infected left breast seroma ?Surgeon:Ramyah Pankowski K Javon Hupfer ?Anesthesia: General ?Indications:This is a 61 year old female diagnosed with right breast invasive ductal carcinoma.  She is s/p right breast lumpectomy and sentinel lymph node biopsy by Dr. Marlou Starks on 06/03/21.  She recently completed radiation to the right breast.  She has developed progressively worsening pain, swelling, and induration since finishing the radiation.  She was started on antibiotics earlier in the week by Dr. Marlou Starks, but the symptoms have worsened.  She presented to the ED for evaluation.  Normal WBC, but CT scan shows a large seroma with fat stranding and skin thickening.  Based on the clinical exam, this is felt to be an infected seroma.   ? ?Description of procedure: The patient is brought to the operating room and placed in the supine position on the operating room table.  After an adequate level of general anesthesia was obtained, her right breast was prepped with ChloraPrep and draped in sterile fashion.  A timeout was taken to ensure the proper patient and proper procedure.  Her right breast is visibly indurated and swollen.  We anesthetized her previous right upper outer quadrant breast incision with local anesthetic.  I made a 3 cm incision.  We dissected through the subcutaneous tissues with cautery.  We entered the seroma cavity.  The seroma was completely evacuated.  The fluid does not appear to be grossly purulent.  We completely decompressed the seroma.  Cautery was used for hemostasis.  We inserted 1/4 inch Penrose drain deep into the seroma cavity.  This was secured to the skin with interrupted 3-0 Ethilon sutures.  The remainder of the incision was loosely reapproximated with 3-0 Ethilon sutures.  A dry dressing was applied.  The patient was extubated and brought to recovery room in stable condition.  All sponge,  instrument, and needle counts are correct. ? ?Imogene Burn. Gianno Volner, MD, FACS ?[facility]  ?General Surgery ? ? ?09/08/2021 ?8:27 PM ? ?

## 2021-09-08 NOTE — ED Provider Notes (Signed)
?[facility] DEPT ?Provider Note ? ? ?CSN: [id number] ?Arrival date & time: 09/08/21  1155 ? ?  ? ?History ? ?Chief Complaint  ?Patient presents with  ? Breast Pain  ? ? ?[name] is a 61 y.o. female presenting due to right breast pain.  Patient was diagnosed with DCIS in her right breast in the winter 2022.  On 06/03/2021 patient underwent right upper quadrant lumpectomy.  In March she had 4 weeks of radiation therapy, 5 days.  Finished this on March 30.  Last week she started to experience inflammation of the right breast.  Last Thursday she noted a 101.6 degree fever however broke overnight.  She called her breast surgeon on Tuesday who sent doxycycline to the pharmacy.  She says that this does not change her symptoms and that her breast is becoming more inflamed and painful.  Denies any nipple discharge.  Has not been taking any occasions and is unable to see her breast surgeon until the end of the week and she is supposed to start oral treatment at that time. ? ?HPI ? ?  ? ?Home Medications ?Prior to Admission medications   ?Medication Sig Start Date End Date Taking? Authorizing Provider  ?FLUoxetine (PROZAC) 20 MG tablet Take 1.5 tablets (30 mg total) by mouth daily. Take 1.5 tablet by mouth daily. 02/12/21   Lorrene Reid, PA-C  ?hydrochlorothiazide (HYDRODIURIL) 50 MG tablet Take 1 tablet (50 mg total) by mouth daily. 02/12/21   Lorrene Reid, PA-C  ?HYDROcodone-acetaminophen (NORCO/VICODIN) 5-325 MG tablet Take 1 tablet by mouth every 6 (six) hours as needed for moderate pain or severe pain. 06/03/21   [name], MD  ?letrozole [facility]) 2.5 MG tablet Take 1 tablet (2.5 mg total) by mouth daily. 09/09/21   Nicholas Lose, MD  ?lisinopril (ZESTRIL) 40 MG tablet Take 1 tablet (40 mg total) by mouth daily. 02/12/21   Lorrene Reid, PA-C  ?metoprolol succinate (TOPROL-XL) 25 MG 24 hr tablet Take 1 tablet (25 mg total) by mouth daily. **PLEASE CONTACT OUR OFFICE TO SCHEDULE A  FOLLOW UP FOR FUTURE MED REFILLS** 08/05/21   Lorrene Reid, PA-C  ?ondansetron (ZOFRAN-ODT) 4 MG disintegrating tablet Take 1 tablet (4 mg total) by mouth 2 (two) times daily as needed for nausea or vomiting. 07/19/21   Mauri Pole, MD  ?OXYGEN Inhale into the lungs. At home at oxygen, uses rarely    [provider]  ?sucralfate (CARAFATE) 1 GM/10ML suspension Take 10 mLs (1 g total) by mouth 3 (three) times daily as needed. 07/19/21   Mauri Pole, MD  ?   ? ?Allergies    ?Tramadol   ? ?Review of Systems   ?Review of Systems ? ?Physical Exam ?Updated Vital Signs ?BP (!) 144/90 (BP Location: Left Arm)   Pulse 86   Temp 99 ?F (37.2 ?C) (Oral)   Resp 18   Ht '5\' 3"'$  (1.6 m)   Wt 95.3 kg   LMP 02/07/2013   SpO2 99%   BMI 37.20 kg/m?  ?Physical Exam ?Vitals and nursing note reviewed.  ?Constitutional:   ?   Appearance: Normal appearance.  ?HENT:  ?   Head: Normocephalic and atraumatic.  ?Eyes:  ?   General: No scleral icterus. ?   Conjunctiva/sclera: Conjunctivae normal.  ?Pulmonary:  ?   Effort: Pulmonary effort is normal. No respiratory distress.  ?Genitourinary: ?   Comments: Indurated and erythematous right breast.  Warmth to the superimposing skin.  No nipple discharge or  open wounds.  No palpable abscesses or masses.  Normal left breast exam.  She also has tender and indurated tissue in her right axilla ?Skin: ?   Findings: No rash.  ?Neurological:  ?   Mental Status: She is alert.  ?Psychiatric:     ?   Mood and Affect: Mood normal.  ? ? ?ED Results / Procedures / Treatments   ?Labs ?(all labs ordered are listed, but only abnormal results are displayed) ?Labs Reviewed  ?CBC WITH DIFFERENTIAL/PLATELET - Abnormal; Notable for the following components:  ?    Result Value  ? Platelets 414 (*)   ? Monocytes Absolute 1.1 (*)   ? All other components within normal limits  ?BASIC METABOLIC PANEL - Abnormal; Notable for the following components:  ? Creatinine, Ser 1.03 (*)   ? All other  components within normal limits  ? ? ?EKG ?None ? ?Radiology ?DG Chest 1 View ? ?Result Date: 09/08/2021 ?CLINICAL DATA:  Painful mass RIGHT breast, lumpectomy in January 2023, developed fever and chills and start antibiotics on Tuesday EXAM: CHEST  1 VIEW COMPARISON:  Portable exam 1502 hours compared to 02/07/2021 FINDINGS: Minimal enlargement of cardiac silhouette. Mediastinal contours and pulmonary vascularity normal. Lungs clear. No infiltrate, pleural effusion, or pneumothorax. Surgical clips in RIGHT axilla. IMPRESSION: No acute abnormalities. Electronically Signed   By: Lavonia Dana M.D.   On: 09/08/2021 15:30  ? ?CT Chest W Contrast ? ?Result Date: 09/08/2021 ?CLINICAL DATA:  Painful right breast mass. Status post lumpectomy in January of 2023. EXAM: CT CHEST WITH CONTRAST TECHNIQUE: Multidetector CT imaging of the chest was performed during intravenous contrast administration. RADIATION DOSE REDUCTION: This exam was performed according to the departmental dose-optimization program which includes automated exposure control, adjustment of the mA and/or kV according to patient size and/or use of iterative reconstruction technique. CONTRAST:  10m OMNIPAQUE IOHEXOL 300 MG/ML  SOLN COMPARISON:  12/10/2020 FINDINGS: Cardiovascular: Normal heart size. No pericardial effusion. Aortic atherosclerotic calcifications. Mediastinum/Nodes: No enlarged mediastinal, hilar, or axillary lymph nodes. Status post right axillary node dissection. Thyroid gland, trachea, and esophagus demonstrate no significant findings. Lungs/Pleura: No pleural effusion, airspace consolidation, or atelectasis. Within the anteromedial right upper lobe there is a nodule measuring 6 mm, image 63/7. Unchanged from previous exam. Scar identified within the inferior lingula. Unchanged from prior study. Stable perifissural nodule along the minor fissure of the right lung measuring 3 mm. This likely represents a benign intrapulmonary lymph node, image  75/7. Upper Abdomen: No acute abnormality identified within the imaged portions of the aperture abdomen. Moderate hiatal hernia identified. Right adrenal gland nodule measures 3.2 by 2.5 cm. This is unchanged when compared with 02/28/2019 compatible with a benign adenoma. No follow-up recommended.Calcified gallstones are identified measuring up to 2.1 cm. Musculoskeletal: Postoperative change within the right breast compatible with previous lumpectomy. Peripherally enhancing fluid attenuating mass within the right breast measures 7.9 x 5.5 cm, image 62/2. There is surrounding fat stranding. Skin thickening overlies the right breast. IMPRESSION: 1. Status post right breast lumpectomy and right axillary node dissection. 2. Peripherally enhancing fluid attenuating mass within the right breast is identified with surrounding fat stranding and overlying skin thickening. Findings consistent with postoperative fluid collection. Underlying infection cannot be excluded. If symptomatic, [facility]. 3. Stable right adrenal gland adenoma. 4. Stable 6 mm right upper lobe lung nodule. 5. Hiatal hernia. 6. Gallstones. 7. Aortic Atherosclerosis (ICD10-I70.0). Electronically Signed   By: TLovena Le  Clovis Riley M.D.   On: 09/08/2021 17:49   ? ?Procedures ?Procedures  ? ?Medications Ordered in ED ?Medications  ?sodium chloride (PF) 0.9 % injection (has no administration in time range)  ?ketorolac (TORADOL) 15 MG/ML injection 15 mg (15 mg Intravenous Given 09/08/21 1544)  ?iohexol (OMNIPAQUE) 300 MG/ML solution 75 mL (75 mLs Intravenous Contrast Given 09/08/21 1721)  ? ? ?ED Course/ Medical Decision Making/ A&P ?  ?                        ?Medical Decision Making ?Amount and/or Complexity of Data Reviewed ?Labs: ordered. ?Radiology: ordered. ? ?Risk ?Prescription drug management. ? ? ?This patient presents to the ED for concern of right breast pain in the setting of DCIS  of the right breast. ?  ? ?Past Medical History / Co-morbidities / Social History: ?DCIS status post lumpectomy and radiation therapy. ?  ?Additional history: ?Additional history obtained from internal and external cha

## 2021-09-08 NOTE — H&P (Signed)
[name] is an 61 y.o. female.   ?Chief Complaint: Right breast tenderness ?HPI: This is a 62 year old female diagnosed with right breast invasive ductal carcinoma.  She is s/p right breast lumpectomy and sentinel lymph node biopsy by Dr. Marlou Starks on 06/03/21.  She recently completed radiation to the right breast.  She has developed progressively worsening pain, swelling, and induration since finishing the radiation.  She was started on antibiotics earlier in the week by Dr. Marlou Starks, but the symptoms have worsened.  She presented to the ED for evaluation.  Normal WBC, but CT scan shows a large seroma with fat stranding and skin thickening.  Based on the clinical exam, this is felt to be an infected seroma.   ? ?Past Medical History:  ?Diagnosis Date  ? Acid reflux   ? takes Zantac and Omeprazole daily  ? Anemia   ? Anxiety   ? takes Citaopram daily  ? Arthritis   ? right knee  ? 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 Chronic back pain   ? DDD  ? Clotting disorder ([place])   ? hx of blood clot following knee scope  ? Depression   ? Fibromyalgia   ? History of bronchitis 3+yrs ago  ? Hyperlipidemia   ? takes Atorvastatin daily  ? Hypertension   ? takes Lisinopril and HCTZ daily  ? Insomnia   ? takes Elavil nightly as needed  ? Paraesophageal hernia   ? ? ?Past Surgical History:  ?Procedure Laterality Date  ? BREAST BIOPSY Left   ? BREAST LUMPECTOMY WITH RADIOACTIVE SEED AND SENTINEL LYMPH NODE BIOPSY Right 06/03/2021  ? Procedure: RIGHT BREAST LUMPECTOMY WITH RADIOACTIVE SEED AND SENTINEL LYMPH NODE BIOPSY;  Surgeon: Jovita Kussmaul, MD;  Location: [facility];  Service: General;  Laterality: Right;  ? BUNIONECTOMY Right 02/18/2018  ? Procedure: Yehuda Budd;  Surgeon: Edrick Kins, DPM;  Location: [facility];  Service: Podiatry;  Laterality: Right;  ? CAPSULOTOMY Bilateral 02/18/2018  ? Procedure: CAPSULOTOMY MPJ RELEASE JOINT 2N BILATERAL;  Surgeon: Edrick Kins, DPM;  Location: [facility];  Service: Podiatry;   Laterality: Bilateral;  ? CARPOMETACARPEL SUSPENSION PLASTY Left 01/27/2018  ? Procedure: LEFT THUMB ligament reconstruction and tendon interposition;  Surgeon: Leandrew Koyanagi, MD;  Location: [facility];  Service: Orthopedics;  Laterality: Left;  ? CARPOMETACARPEL SUSPENSION PLASTY Left 03/07/2020  ? Procedure: REVISION LEFT THUMB CARPOMETACARPAL ([place]) ARTHROPLASTY;  Surgeon: Leandrew Koyanagi, MD;  Location: [facility];  Service: Orthopedics;  Laterality: Left;  ? CHONDROPLASTY Right 06/28/2014  ? Procedure: CHONDROPLASTY;  Surgeon: Marianna Payment, MD;  Location: [facility];  Service: Orthopedics;  Laterality: Right;  ? COLONOSCOPY N/A 05/29/2014  ? Procedure: COLONOSCOPY;  Surgeon: Daneil Dolin, MD;  [facility];  Service: Endoscopy;  Laterality: N/A;  215pm- Pt is working until 12:00 so she can't come any earlier  ? ESOPHAGOGASTRODUODENOSCOPY N/A 05/29/2014  ? Procedure: ESOPHAGOGASTRODUODENOSCOPY (EGD);  Surgeon: Daneil Dolin, MD;  [facility];  Service: Endoscopy;  Laterality: N/A;  ? ESOPHAGOGASTRODUODENOSCOPY N/A 12/17/2020  ? Procedure: ESOPHAGOGASTRODUODENOSCOPY (EGD);  Surgeon: Lajuana Matte, MD;  Location: [facility];  Service: Thoracic;  Laterality: N/A;  ? GANGLION CYST EXCISION Left 01/13/2002  ? HAMMER TOE SURGERY Bilateral 02/18/2018  ? Procedure: HAMMER TOE CORRECTION2ND BILATERAL;  Surgeon: Edrick Kins, DPM;  Location: [facility];  Service: Podiatry;  Laterality: Bilateral;  ? HERNIA REPAIR    ?  KNEE ARTHROSCOPY WITH MEDIAL MENISECTOMY Right 06/28/2014  ? Procedure: RIGHT KNEE ARTHROSCOPY WITH PARTIAL MEDIAL MENISCECTOMY AND CHONDROPLASTY;  Surgeon: Marianna Payment, MD;  Location: [facility];  Service: Orthopedics;  Laterality: Right;  ? MALONEY DILATION N/A 05/29/2014  ? Procedure: MALONEY DILATION;  Surgeon: Daneil Dolin, MD;  [facility];  Service: Endoscopy;  Laterality: N/A;  ? PARTIAL KNEE  ARTHROPLASTY Right 09/15/2014  ? Procedure: RIGHT UNICOMPARTMENTAL KNEE ARTHROPLASTY;  Surgeon: Leandrew Koyanagi, MD;  Location: [facility];  Service: Orthopedics;  Laterality: Right;  ? SHOULDER ARTHROSCOPY Right   ? TOTAL KNEE ARTHROPLASTY Left 04/08/2004  ? XI ROBOTIC ASSISTED PARAESOPHAGEAL HERNIA REPAIR N/A 12/17/2020  ? Procedure: XI ROBOTIC ASSISTED Laparoscopy PARAESOPHAGEAL HERNIA REPAIR WITH FUNDOPLICATION;  Surgeon: Lajuana Matte, MD;  Location: [facility];  Service: Thoracic;  Laterality: N/A;  ? ? ?Family History  ?Problem Relation Age of Onset  ? Stroke Mother   ? Lung cancer Father 56  ? GI problems Father   ? Prostate cancer Father   ? Cancer Sister   ?     breast  ? Breast cancer Sister 34  ? Lung cancer Maternal Grandfather   ? Lung cancer Paternal Grandfather   ? Diabetes Son   ? Colon cancer Neg Hx   ? Esophageal cancer Neg Hx   ? Pancreatic cancer Neg Hx   ? ?Social History:  reports that she has never smoked. She has never used smokeless tobacco. She reports that she does not drink alcohol and does not use drugs. ? ?Allergies:  ?Allergies  ?Allergen Reactions  ? Tramadol Itching  ?  "bugs crawling all over"  ? ? ?Prior to Admission medications   ?Medication Sig Start Date End Date Taking? Authorizing Provider  ?doxycycline (MONODOX) 100 MG capsule Take 100 mg by mouth 2 (two) times daily. 09/03/21  Yes [provider]  ?FLUoxetine (PROZAC) 20 MG tablet Take 1.5 tablets (30 mg total) by mouth daily. Take 1.5 tablet by mouth daily. 02/12/21  Yes Abonza, [name], PA-C  ?hydrochlorothiazide (HYDRODIURIL) 50 MG tablet Take 1 tablet (50 mg total) by mouth daily. 02/12/21  Yes Abonza, [name], PA-C  ?lisinopril (ZESTRIL) 40 MG tablet Take 1 tablet (40 mg total) by mouth daily. 02/12/21  Yes Lorrene Reid, PA-C  ?metoprolol succinate (TOPROL-XL) 25 MG 24 hr tablet Take 1 tablet (25 mg total) by mouth daily. **PLEASE CONTACT OUR OFFICE TO SCHEDULE A FOLLOW UP FOR FUTURE MED REFILLS** 08/05/21  Yes  Abonza, Maritza, PA-C  ?ondansetron (ZOFRAN-ODT) 4 MG disintegrating tablet Take 1 tablet (4 mg total) by mouth 2 (two) times daily as needed for nausea or vomiting. 07/19/21  Yes Nandigam, Venia Minks, MD  ?HYDROcodone-acetaminophen (NORCO/VICODIN) 5-325 MG tablet Take 1 tablet by mouth every 6 (six) hours as needed for moderate pain or severe pain. ?Patient not taking: Reported on 09/08/2021 06/03/21   [name], MD  ?letrozole [facility]) 2.5 MG tablet Take 1 tablet (2.5 mg total) by mouth daily. 09/09/21   Nicholas Lose, MD  ?OXYGEN Inhale into the lungs. At home at oxygen, uses rarely    [provider]  ?sucralfate (CARAFATE) 1 GM/10ML suspension Take 10 mLs (1 g total) by mouth 3 (three) times daily as needed. ?Patient not taking: Reported on 09/08/2021 07/19/21   Mauri Pole, MD  ? ? ? ? ?Results for orders placed or performed during the hospital encounter of 09/08/21 (from the past 48 hour(s))  ?CBC with Differential  Status: Abnormal  ? Collection Time: 09/08/21  2:27 PM  ?Result Value Ref Range  ? WBC 8.5 4.0 - 10.5 K/uL  ? RBC 4.20 3.87 - 5.11 MIL/uL  ? Hemoglobin 12.6 12.0 - 15.0 g/dL  ? HCT 38.1 36.0 - 46.0 %  ? MCV 90.7 80.0 - 100.0 fL  ? MCH 30.0 26.0 - 34.0 pg  ? MCHC 33.1 30.0 - 36.0 g/dL  ? RDW 15.1 11.5 - 15.5 %  ? Platelets 414 (H) 150 - 400 K/uL  ? nRBC 0.0 0.0 - 0.2 %  ? Neutrophils Relative % 65 %  ? Neutro Abs 5.6 1.7 - 7.7 K/uL  ? Lymphocytes Relative 20 %  ? Lymphs Abs 1.7 0.7 - 4.0 K/uL  ? Monocytes Relative 13 %  ? Monocytes Absolute 1.1 (H) 0.1 - 1.0 K/uL  ? Eosinophils Relative 1 %  ? Eosinophils Absolute 0.1 0.0 - 0.5 K/uL  ? Basophils Relative 1 %  ? Basophils Absolute 0.1 0.0 - 0.1 K/uL  ? Immature Granulocytes 0 %  ? Abs Immature Granulocytes 0.03 0.00 - 0.07 K/uL  ?  Comment: Performed at [facility], [place] [address], [place], [place] [address]  ?Basic metabolic panel     Status: Abnormal  ? Collection Time: 09/08/21  3:06 PM  ?Result Value Ref  Range  ? Sodium 138 135 - 145 mmol/L  ? Potassium 3.9 3.5 - 5.1 mmol/L  ? Chloride 101 98 - 111 mmol/L  ? CO2 29 22 - 32 mmol/L  ? Glucose, Bld 99 70 - 99 mg/dL  ?  Comment: Glucose reference range applies only to s

## 2021-09-08 NOTE — Discharge Instructions (Signed)
Keep a dry dressing taped over the wound to absorb any drainage.  You will likely need to change this 2 or 3 times a day for the first few days.  Keep the gauze in place with some tape.  You may shower with the dressing in place and then change the dressing after your shower. ?

## 2021-09-08 NOTE — Transfer of Care (Signed)
Immediate Anesthesia Transfer of Care Note ? ?Patient: STEPHANNIE BRONER ? ?Procedure(s) Performed: IRRIGATION AND DEBRIDEMENT RIGHT BREAST ABSCESS (Right) ? ?Patient Location: PACU ? ?Anesthesia Type:General ? ?Level of Consciousness: drowsy and patient cooperative ? ?Airway & Oxygen Therapy: Patient Spontanous Breathing and Patient connected to face mask oxygen ? ?Post-op Assessment: Report given to RN and Post -op Vital signs reviewed and stable ? ?Post vital signs: Reviewed and stable ? ?Last Vitals:  ?Vitals Value Taken Time  ?BP 125/70 09/08/21 2033  ?Temp 36.7 ?C 09/08/21 2032  ?Pulse 92 09/08/21 2034  ?Resp 17 09/08/21 2034  ?SpO2 92 % 09/08/21 2034  ?Vitals shown include unvalidated device data. ? ?Last Pain:  ?Vitals:  ? 09/08/21 1914  ?TempSrc: Oral  ?PainSc: 6   ?   ? ?Patients Stated Pain Goal: 5 (09/08/21 1207) ? ?Complications: No notable events documented. ?

## 2021-09-08 NOTE — Anesthesia Preprocedure Evaluation (Signed)
Anesthesia Evaluation  ?Patient identified by MRN, date of birth, ID band ?Patient awake ? ? ? ?Reviewed: ?Allergy & Precautions, NPO status , Patient's Chart, lab work & pertinent test results ? ?Airway ?Mallampati: II ? ?TM Distance: >3 FB ?Neck ROM: Full ? ? ? Dental ? ?(+) Teeth Intact, Dental Advisory Given ?  ?Pulmonary ?neg pulmonary ROS,  ?  ?Pulmonary exam normal ?breath sounds clear to auscultation ? ? ? ? ? ? Cardiovascular ?hypertension, + DVT  ?Normal cardiovascular exam ?Rhythm:Regular Rate:Normal ? ? ?  ?Neuro/Psych ?PSYCHIATRIC DISORDERS Anxiety Depression negative neurological ROS ?   ? GI/Hepatic ?Neg liver ROS, hiatal hernia, GERD  Medicated,  ?Endo/Other  ?Obesity ? ? Renal/GU ?Renal InsufficiencyRenal disease  ? ?  ?Musculoskeletal ? ?(+) Arthritis , Fibromyalgia - ? Abdominal ?  ?Peds ? Hematology ?negative hematology ROS ?(+)   ?Anesthesia Other Findings ?Day of surgery medications reviewed with the patient. ? ?Right breast abscess ? Reproductive/Obstetrics ? ?  ? ? ? ? ? ? ? ? ? ? ? ? ? ?  ?  ? ? ? ? ? ? ? ? ?Anesthesia Physical ?Anesthesia Plan ? ?ASA: 3 and emergent ? ?Anesthesia Plan: General  ? ?Post-op Pain Management: Ofirmev IV (intra-op)*  ? ?Induction: Intravenous ? ?PONV Risk Score and Plan: 3 and Midazolam, Dexamethasone and Ondansetron ? ?Airway Management Planned: LMA ? ?Additional Equipment:  ? ?Intra-op Plan:  ? ?Post-operative Plan: Extubation in OR ? ?Informed Consent: I have reviewed the patients History and Physical, chart, labs and discussed the procedure including the risks, benefits and alternatives for the proposed anesthesia with the patient or authorized representative who has indicated his/her understanding and acceptance.  ? ? ? ?Dental advisory given ? ?Plan Discussed with: CRNA ? ?Anesthesia Plan Comments:   ? ? ? ? ? ? ?Anesthesia Quick Evaluation ? ?

## 2021-09-09 ENCOUNTER — Encounter (HOSPITAL_COMMUNITY): Payer: Self-pay | Admitting: Surgery

## 2021-09-09 ENCOUNTER — Telehealth: Payer: Self-pay | Admitting: *Deleted

## 2021-09-09 NOTE — Anesthesia Postprocedure Evaluation (Signed)
Anesthesia Post Note ? ?Patient: [name] ? ?Procedure(s) Performed: IRRIGATION AND DEBRIDEMENT RIGHT BREAST ABSCESS (Right) ? ?  ? ?Patient location during evaluation: PACU ?Anesthesia Type: General ?Level of consciousness: awake and alert ?Pain management: pain level controlled ?Vital Signs Assessment: post-procedure vital signs reviewed and stable ?Respiratory status: spontaneous breathing, nonlabored ventilation, respiratory function stable and patient connected to nasal cannula oxygen ?Cardiovascular status: blood pressure returned to baseline and stable ?Postop Assessment: no apparent nausea or vomiting ?Anesthetic complications: no ? ? ?No notable events documented. ? ?Last Vitals:  ?Vitals:  ? 09/08/21 2106 09/08/21 2112  ?BP: (!) 123/57 (!) 123/57  ?Pulse: 73 69  ?Resp: 20 17  ?Temp:  36.4 ?C  ?SpO2: 93% 96%  ?  ?Last Pain:  ?Vitals:  ? 09/08/21 2112  ?TempSrc:   ?PainSc: 0-No pain  ? ? ?  ?  ?  ?  ?  ?  ? ?[place] Lighter ? ? ? ? ?

## 2021-09-09 NOTE — Telephone Encounter (Signed)
Received call from pt stating over the weekend she was seen in the ED for right breast pain and underwent right breast surgery for an abscess.  Pt states she currently has a drain placed and will be f/u with surgeon. Pt requesting advice from MD if she should start Letrozole or wait.  Per MD pt needing to wait 2 weeks to start letrozole and f/u in our office at that time.  Pt educated, appt scheduled and pt verbalized understanding.  ?

## 2021-09-15 NOTE — Progress Notes (Signed)
?  Radiation Oncology         [phone] ?________________________________ ? ?Name: [name] MRN: [id number]  ?Date of Service: 09/16/2021  DOB: Jun 28, 1960 ? ?Post Treatment Telephone Note ? ?Diagnosis:   Stage IA, pT1bN0M0 grade 1, ER/PR positive invasive ductal carcinoma of the right breast ? ?Intent: Curative ? ?Radiation Treatment Dates: 07/10/2021 through 08/08/2021 ?Site Technique Total Dose (Gy) Dose per Fx (Gy) Completed Fx Beam Energies  ?Breast, Right: Breast_R 3D 42.56/42.56 2.66 16/16 10X  ?Breast, Right: Breast_R_Bst 3D 8/8 2 4/4 10X  ? ?Narrative: The patient tolerated radiation therapy relatively well. She developed fatigue and anticipated skin changes in the treatment field. She did have issues with postop infection and seroma. She did also undergo I&D of an infected seroma on 09/08/21.  ? ? ?Impression/Plan: ?1. Stage IA, pT1bN0M0 grade 1, ER/PR positive invasive ductal carcinoma of the right breast.  I was unable to reach the patient but left a voicemail and on the message, I discussed that we would be happy to continue to follow her as needed, but she will also continue to follow up with Dr. Georgette Dover since surgery and with Dr. Lindi Adie in medical oncology. She was counseled to call if she had questions about skin care or measures to avoid sun exposure to this area.  ? ? ? ? ? ?Carola Rhine, PAC  ? ? ? ? ?

## 2021-09-16 ENCOUNTER — Ambulatory Visit
Admission: RE | Admit: 2021-09-16 | Discharge: 2021-09-16 | Disposition: A | Discharge status: Home / Self Care | Payer: Medicare Other | Source: Ambulatory Visit | Attending: Adult Health | Admitting: Adult Health

## 2021-09-16 ENCOUNTER — Telehealth: Payer: Self-pay | Admitting: Physician Assistant

## 2021-09-16 DIAGNOSIS — C50411 Malignant neoplasm of upper-outer quadrant of right female breast: Secondary | ICD-10-CM | POA: Insufficient documentation

## 2021-09-16 DIAGNOSIS — F419 Anxiety disorder, unspecified: Secondary | ICD-10-CM

## 2021-09-16 DIAGNOSIS — I10 Essential (primary) hypertension: Secondary | ICD-10-CM

## 2021-09-16 DIAGNOSIS — Z170 Estrogen receptor positive status [ER+]: Secondary | ICD-10-CM | POA: Insufficient documentation

## 2021-09-16 MED ORDER — LISINOPRIL 40 MG PO TABS: 40.0000 mg | ORAL_TABLET | Freq: Every day | ORAL | 0 refills | Status: DC

## 2021-09-16 MED ORDER — HYDROCHLOROTHIAZIDE 50 MG PO TABS: 50.0000 mg | ORAL_TABLET | Freq: Every day | ORAL | 0 refills | Status: DC

## 2021-09-16 MED ORDER — FLUOXETINE HCL 20 MG PO TABS: 30.0000 mg | ORAL_TABLET | Freq: Every day | ORAL | 0 refills | Status: DC

## 2021-09-16 MED ORDER — METOPROLOL SUCCINATE ER 25 MG PO TB24: 25.0000 mg | ORAL_TABLET | Freq: Every day | ORAL | 0 refills | Status: DC

## 2021-09-16 NOTE — Telephone Encounter (Signed)
10 day supply sent to pharmacy. AS, CMA ?

## 2021-09-16 NOTE — Telephone Encounter (Signed)
Patient has an appointment here next Tuesday however she will be out of her Lisinopril, HCTZ, Metoprolol, Prozac. Please advise.  ?

## 2021-09-20 ENCOUNTER — Telehealth: Payer: Self-pay

## 2021-09-20 NOTE — Telephone Encounter (Signed)
Return call to pt, left VM requesting call back to further discuss her symptoms.   ?

## 2021-09-23 ENCOUNTER — Inpatient Hospital Stay (HOSPITAL_COMMUNITY)
Admission: AD | Admit: 2021-09-23 | Discharge: 2021-09-27 | DRG: 601 | Disposition: A | Discharge status: Home / Self Care | Payer: Medicare Other | Source: Ambulatory Visit | Attending: Internal Medicine | Admitting: Internal Medicine

## 2021-09-23 ENCOUNTER — Inpatient Hospital Stay: Payer: Medicare Other

## 2021-09-23 ENCOUNTER — Other Ambulatory Visit: Payer: Self-pay

## 2021-09-23 ENCOUNTER — Inpatient Hospital Stay: Payer: Medicare Other | Attending: Hematology and Oncology | Admitting: Hematology and Oncology

## 2021-09-23 ENCOUNTER — Encounter (HOSPITAL_COMMUNITY): Payer: Self-pay | Admitting: Student

## 2021-09-23 DIAGNOSIS — E669 Obesity, unspecified: Secondary | ICD-10-CM | POA: Diagnosis present

## 2021-09-23 DIAGNOSIS — I10 Essential (primary) hypertension: Secondary | ICD-10-CM | POA: Diagnosis present

## 2021-09-23 DIAGNOSIS — K219 Gastro-esophageal reflux disease without esophagitis: Secondary | ICD-10-CM | POA: Diagnosis present

## 2021-09-23 DIAGNOSIS — Z79899 Other long term (current) drug therapy: Secondary | ICD-10-CM | POA: Diagnosis not present

## 2021-09-23 DIAGNOSIS — Z170 Estrogen receptor positive status [ER+]: Secondary | ICD-10-CM

## 2021-09-23 DIAGNOSIS — N611 Abscess of the breast and nipple: Principal | ICD-10-CM | POA: Diagnosis present

## 2021-09-23 DIAGNOSIS — F32A Depression, unspecified: Secondary | ICD-10-CM | POA: Diagnosis present

## 2021-09-23 DIAGNOSIS — N61 Mastitis without abscess: Secondary | ICD-10-CM | POA: Diagnosis not present

## 2021-09-23 DIAGNOSIS — Z833 Family history of diabetes mellitus: Secondary | ICD-10-CM | POA: Diagnosis not present

## 2021-09-23 DIAGNOSIS — Z823 Family history of stroke: Secondary | ICD-10-CM | POA: Diagnosis not present

## 2021-09-23 DIAGNOSIS — F419 Anxiety disorder, unspecified: Secondary | ICD-10-CM | POA: Diagnosis present

## 2021-09-23 DIAGNOSIS — Z801 Family history of malignant neoplasm of trachea, bronchus and lung: Secondary | ICD-10-CM

## 2021-09-23 DIAGNOSIS — Z803 Family history of malignant neoplasm of breast: Secondary | ICD-10-CM

## 2021-09-23 DIAGNOSIS — C50411 Malignant neoplasm of upper-outer quadrant of right female breast: Secondary | ICD-10-CM

## 2021-09-23 DIAGNOSIS — Z6838 Body mass index (BMI) 38.0-38.9, adult: Secondary | ICD-10-CM | POA: Diagnosis not present

## 2021-09-23 DIAGNOSIS — E785 Hyperlipidemia, unspecified: Secondary | ICD-10-CM | POA: Diagnosis present

## 2021-09-23 DIAGNOSIS — Z8042 Family history of malignant neoplasm of prostate: Secondary | ICD-10-CM

## 2021-09-23 DIAGNOSIS — Z96653 Presence of artificial knee joint, bilateral: Secondary | ICD-10-CM | POA: Diagnosis present

## 2021-09-23 LAB — CBC WITH DIFFERENTIAL/PLATELET
Abs Immature Granulocytes: 0.03 10*3/uL (ref 0.00–0.07)
Basophils Absolute: 0.1 10*3/uL (ref 0.0–0.1)
Basophils Relative: 1 %
Eosinophils Absolute: 0.1 10*3/uL (ref 0.0–0.5)
Eosinophils Relative: 1 %
HCT: 38.7 % (ref 36.0–46.0)
Hemoglobin: 12.4 g/dL (ref 12.0–15.0)
Immature Granulocytes: 0 %
Lymphocytes Relative: 27 %
Lymphs Abs: 2.2 10*3/uL (ref 0.7–4.0)
MCH: 29.3 pg (ref 26.0–34.0)
MCHC: 32 g/dL (ref 30.0–36.0)
MCV: 91.5 fL (ref 80.0–100.0)
Monocytes Absolute: 0.9 10*3/uL (ref 0.1–1.0)
Monocytes Relative: 11 %
Neutro Abs: 4.9 10*3/uL (ref 1.7–7.7)
Neutrophils Relative %: 60 %
Platelets: 390 10*3/uL (ref 150–400)
RBC: 4.23 MIL/uL (ref 3.87–5.11)
RDW: 15.2 % (ref 11.5–15.5)
WBC: 8.3 10*3/uL (ref 4.0–10.5)
nRBC: 0 % (ref 0.0–0.2)

## 2021-09-23 LAB — COMPREHENSIVE METABOLIC PANEL
ALT: 53 U/L — ABNORMAL HIGH (ref 0–44)
AST: 19 U/L (ref 15–41)
Albumin: 3.8 g/dL (ref 3.5–5.0)
Alkaline Phosphatase: 107 U/L (ref 38–126)
Anion gap: 8 (ref 5–15)
BUN: 17 mg/dL (ref 6–20)
CO2: 28 mmol/L (ref 22–32)
Calcium: 9.6 mg/dL (ref 8.9–10.3)
Chloride: 102 mmol/L (ref 98–111)
Creatinine, Ser: 1.04 mg/dL — ABNORMAL HIGH (ref 0.44–1.00)
GFR, Estimated: >60 mL/min (ref >60)
Glucose, Bld: 103 mg/dL — ABNORMAL HIGH (ref 70–99)
Potassium: 4.2 mmol/L (ref 3.5–5.1)
Sodium: 138 mmol/L (ref 135–145)
Total Bilirubin: 0.4 mg/dL (ref 0.3–1.2)
Total Protein: 7.3 g/dL (ref 6.5–8.1)

## 2021-09-23 MED ORDER — VANCOMYCIN HCL 2000 MG/400ML IV SOLN
2000.0000 mg | Freq: Once | INTRAVENOUS | Status: AC
  Administered 2021-09-23: 2000 mg via INTRAVENOUS
  Filled 2021-09-23: qty 400

## 2021-09-23 MED ORDER — SODIUM CHLORIDE 0.9 % IV SOLN: Freq: Once | INTRAVENOUS | Status: AC

## 2021-09-23 MED ORDER — ONDANSETRON HCL 4 MG PO TABS: 4.0000 mg | ORAL_TABLET | Freq: Four times a day (QID) | ORAL | Status: DC | PRN

## 2021-09-23 MED ORDER — ACETAMINOPHEN 325 MG PO TABS
650.0000 mg | ORAL_TABLET | Freq: Four times a day (QID) | ORAL | Status: DC | PRN
  Administered 2021-09-24 – 2021-09-27 (×7): 650 mg via ORAL
  Filled 2021-09-23 (×7): qty 2

## 2021-09-23 MED ORDER — OXYCODONE HCL 5 MG PO TABS: 5.0000 mg | ORAL_TABLET | ORAL | Status: DC | PRN

## 2021-09-23 MED ORDER — VANCOMYCIN HCL 1250 MG/250ML IV SOLN
1250.0000 mg | INTRAVENOUS | Status: DC
  Administered 2021-09-24 – 2021-09-27 (×3): 1250 mg via INTRAVENOUS
  Filled 2021-09-23 (×4): qty 250

## 2021-09-23 MED ORDER — PANTOPRAZOLE SODIUM 40 MG PO TBEC
40.0000 mg | DELAYED_RELEASE_TABLET | Freq: Every day | ORAL | Status: DC
  Administered 2021-09-24 – 2021-09-27 (×4): 40 mg via ORAL
  Filled 2021-09-23 (×4): qty 1

## 2021-09-23 MED ORDER — ONDANSETRON HCL 4 MG/2ML IJ SOLN
4.0000 mg | Freq: Four times a day (QID) | INTRAMUSCULAR | Status: DC | PRN
  Administered 2021-09-23: 4 mg via INTRAVENOUS
  Filled 2021-09-23: qty 2

## 2021-09-23 MED ORDER — ACETAMINOPHEN 650 MG RE SUPP: 650.0000 mg | Freq: Four times a day (QID) | RECTAL | Status: DC | PRN

## 2021-09-23 MED ORDER — MORPHINE SULFATE (PF) 2 MG/ML IV SOLN
2.0000 mg | INTRAVENOUS | Status: DC | PRN
  Administered 2021-09-24: 2 mg via INTRAVENOUS
  Filled 2021-09-23: qty 1

## 2021-09-23 MED ORDER — SODIUM CHLORIDE 0.9 % IV SOLN
1.0000 g | INTRAVENOUS | Status: DC
  Administered 2021-09-23 – 2021-09-26 (×4): 1 g via INTRAVENOUS
  Filled 2021-09-23 (×5): qty 10

## 2021-09-23 NOTE — Progress Notes (Signed)
Pharmacy Antibiotic Note ? ?[name] is a 61 y.o. female admitted on 09/23/2021 with  breast wound infection .  Pharmacy has been consulted for Vanco dosing. ? ?Active Problem(s): Breast wound. Continued breast pain from I&D of an abscess 16 days ago. She reported continue draining, fever and nausea. She apparently has been on rounds of doxycycline and bactrim,  ? ?PMH: right breast cancer, HTN, GERD, depression, anxiety. ACD, arthritis, chronic back pain, fibromyalgia, HLD, insomnia, Paraesophageal hernia ? ?ID: Nonhealing breat wound (s/p doxy and Bactrim).  ?+ h/o MRSA in 2020. ?- WBC 8.3, Afebrile, Scr 1 ? ?Vanco 5/15.> ? ?Plan: ?Vanco 2g IV x 1 ?Vancomycin 1250 mg IV Q 24 hrs. Goal AUC 400-550. ?Expected AUC: 515 ?SCr used: 1 ? ? ? ?Height: '5\' 3"'$  (160 cm) ?Weight: 98.5 kg (217 lb 2.5 oz) ?IBW/kg (Calculated) : 52.4 ? ?Temp (24hrs), Avg:97.6 ?F (36.4 ?C), Min:97.2 ?F (36.2 ?C), Max:97.9 ?F (36.6 ?C) ? ?Recent Labs  ?Lab 09/23/21 ?1735  ?WBC 8.3  ?CREATININE 1.04*  ?  ?Estimated Creatinine Clearance: 65.1 mL/min (A) (by C-G formula based on SCr of 1.04 mg/dL (H)).   ? ?Allergies  ?Allergen Reactions  ? Tramadol Itching  ?  "bugs crawling all over"  ? ? ?[name] S. Alford Highland, PharmD, BCPS ?Clinical Staff Pharmacist ?[email or web address] ? ?Alford Highland, [employer] ?09/23/2021 6:06 PM ? ?

## 2021-09-23 NOTE — H&P (Signed)
?History and Physical  ? ? ?Patient: [name] WGN:[id number] DOB: [birth year] ?DOA: 09/23/2021 ?DOS: the patient was seen and examined on 09/23/2021 ?PCP: Lorrene Reid, PA-C  ?Patient coming from:  Cancer clinic ? ?Chief Complaint: Breast wound ? ?HPI: ARNECIA Baker is a 61 y.o. female with medical history significant of right breast cancer, HTN, GERD, anxiety. Presenting with right breast wound. She had regular follow up with her oncologist today for her breast cancer. She reported continued breast pain from where she had an I&D of an abscess 16 days ago. She reported continue draining. She reported fever and nausea. She apparently has been on rounds of doxycycline and bactrim, but they have not provided complete resolution. Her oncologist recommended that she be admitted to the hospital for IV abx and to be seen by general surgery. [facility] was consulted. [facility] was called for admission. She denies any other aggravating or alleviating factors.  ? ?Review of Systems: As mentioned in the history of present illness. All other systems reviewed and are negative. ?Past Medical History:  ?Diagnosis Date  ? Acid reflux   ? takes Zantac and Omeprazole daily  ? Anemia   ? Anxiety   ? takes Citaopram daily  ? Arthritis   ? right knee  ? 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 Chronic back pain   ? DDD  ? Clotting disorder ([place])   ? hx of blood clot following knee scope  ? Depression   ? Fibromyalgia   ? History of bronchitis 3+yrs ago  ? Hyperlipidemia   ? takes Atorvastatin daily  ? Hypertension   ? takes Lisinopril and HCTZ daily  ? Insomnia   ? takes Elavil nightly as needed  ? Paraesophageal hernia   ? ?Past Surgical History:  ?Procedure Laterality Date  ? BREAST BIOPSY Left   ? BREAST LUMPECTOMY WITH RADIOACTIVE SEED AND SENTINEL LYMPH NODE BIOPSY Right 06/03/2021  ? Procedure: RIGHT BREAST LUMPECTOMY WITH RADIOACTIVE SEED AND SENTINEL LYMPH NODE BIOPSY;  Surgeon: Jovita Kussmaul, MD;  Location: [place];  Service:  General;  Laterality: Right;  ? BUNIONECTOMY Right 02/18/2018  ? Procedure: Yehuda Budd;  Surgeon: Edrick Kins, DPM;  Location: [place];  Service: Podiatry;  Laterality: Right;  ? CAPSULOTOMY Bilateral 02/18/2018  ? Procedure: CAPSULOTOMY MPJ RELEASE JOINT 2N BILATERAL;  Surgeon: Edrick Kins, DPM;  Location: [place];  Service: Podiatry;  Laterality: Bilateral;  ? CARPOMETACARPEL SUSPENSION PLASTY Left 01/27/2018  ? Procedure: LEFT THUMB ligament reconstruction and tendon interposition;  Surgeon: Leandrew Koyanagi, MD;  Location: [place];  Service: Orthopedics;  Laterality: Left;  ? CARPOMETACARPEL SUSPENSION PLASTY Left 03/07/2020  ? Procedure: REVISION LEFT THUMB CARPOMETACARPAL ([place]) ARTHROPLASTY;  Surgeon: Leandrew Koyanagi, MD;  Location: [place];  Service: Orthopedics;  Laterality: Left;  ? CHONDROPLASTY Right 06/28/2014  ? Procedure: CHONDROPLASTY;  Surgeon: Marianna Payment, MD;  Location: [place];  Service: Orthopedics;  Laterality: Right;  ? COLONOSCOPY N/A 05/29/2014  ? Procedure: COLONOSCOPY;  Surgeon: Daneil Dolin, MD;  [facility];  Service: Endoscopy;  Laterality: N/A;  215pm- Pt is working until 12:00 so she can't come any earlier  ? ESOPHAGOGASTRODUODENOSCOPY N/A 05/29/2014  ? Procedure: ESOPHAGOGASTRODUODENOSCOPY (EGD);  Surgeon: Daneil Dolin, MD;  [facility];  Service: Endoscopy;  Laterality: N/A;  ? ESOPHAGOGASTRODUODENOSCOPY N/A 12/17/2020  ? Procedure: ESOPHAGOGASTRODUODENOSCOPY (EGD);  Surgeon: Lajuana Matte, MD;  [facility];  Service: Thoracic;  Laterality: N/A;  ? GANGLION CYST EXCISION Left 01/13/2002  ? HAMMER TOE SURGERY Bilateral 02/18/2018  ? Procedure: HAMMER TOE CORRECTION2ND BILATERAL;  Surgeon: Edrick Kins, DPM;  Location: [place];  Service: Podiatry;  Laterality: Bilateral;  ? HERNIA REPAIR    ? IRRIGATION AND DEBRIDEMENT ABSCESS Right 09/08/2021  ? Procedure: IRRIGATION AND  DEBRIDEMENT RIGHT BREAST ABSCESS;  Surgeon: Donnie Mesa, MD;  Location: [facility];  Service: General;  Laterality: Right;  ? KNEE ARTHROSCOPY WITH MEDIAL MENISECTOMY Right 06/28/2014  ? Procedure: RIGHT KNEE ARTHROSCOPY WITH PARTIAL MEDIAL MENISCECTOMY AND CHONDROPLASTY;  Surgeon: Marianna Payment, MD;  Location: [place];  Service: Orthopedics;  Laterality: Right;  ? MALONEY DILATION N/A 05/29/2014  ? Procedure: MALONEY DILATION;  Surgeon: Daneil Dolin, MD;  [facility];  Service: Endoscopy;  Laterality: N/A;  ? PARTIAL KNEE ARTHROPLASTY Right 09/15/2014  ? Procedure: RIGHT UNICOMPARTMENTAL KNEE ARTHROPLASTY;  Surgeon: Leandrew Koyanagi, MD;  Location: [place];  Service: Orthopedics;  Laterality: Right;  ? SHOULDER ARTHROSCOPY Right   ? TOTAL KNEE ARTHROPLASTY Left 04/08/2004  ? XI ROBOTIC ASSISTED PARAESOPHAGEAL HERNIA REPAIR N/A 12/17/2020  ? Procedure: XI ROBOTIC ASSISTED Laparoscopy PARAESOPHAGEAL HERNIA REPAIR WITH FUNDOPLICATION;  Surgeon: Lajuana Matte, MD;  Location: [place];  Service: Thoracic;  Laterality: N/A;  ? ?Social History:  reports that she has never smoked. She has never used smokeless tobacco. She reports that she does not drink alcohol and does not use drugs. ? ?Allergies  ?Allergen Reactions  ? Tramadol Itching  ?  "bugs crawling all over"  ? ? ?Family History  ?Problem Relation Age of Onset  ? Stroke Mother   ? Lung cancer Father 68  ? GI problems Father   ? Prostate cancer Father   ? Cancer Sister   ?     breast  ? Breast cancer Sister 11  ? Lung cancer Maternal Grandfather   ? Lung cancer Paternal Grandfather   ? Diabetes Son   ? Colon cancer Neg Hx   ? Esophageal cancer Neg Hx   ? Pancreatic cancer Neg Hx   ? ? ?Prior to Admission medications   ?Medication Sig Start Date End Date Taking? Authorizing Provider  ?doxycycline (MONODOX) 100 MG capsule Take 100 mg by mouth 2 (two) times daily. 09/03/21   [provider]  ?FLUoxetine (PROZAC) 20 MG tablet  Take 1.5 tablets (30 mg total) by mouth daily. Take 1.5 tablet by mouth daily. 09/16/21   Lorrene Reid, PA-C  ?hydrochlorothiazide (HYDRODIURIL) 50 MG tablet Take 1 tablet (50 mg total) by mouth daily. 09/16/21   Lorrene Reid, PA-C  ?letrozole (FEMARA) 2.5 MG tablet Take 1 tablet (2.5 mg total) by mouth daily. 09/09/21   Nicholas Lose, MD  ?lisinopril (ZESTRIL) 40 MG tablet Take 1 tablet (40 mg total) by mouth daily. 09/16/21   Lorrene Reid, PA-C  ?metoprolol succinate (TOPROL-XL) 25 MG 24 hr tablet Take 1 tablet (25 mg total) by mouth daily. **PLEASE CONTACT OUR OFFICE TO SCHEDULE A FOLLOW UP FOR FUTURE MED REFILLS** 09/16/21   Lorrene Reid, PA-C  ?ondansetron (ZOFRAN-ODT) 4 MG disintegrating tablet Take 1 tablet (4 mg total) by mouth 2 (two) times daily as needed for nausea or vomiting. 07/19/21   Mauri Pole, MD  ?oxyCODONE (OXY IR/ROXICODONE) 5 MG immediate release tablet Take 1 tablet (5 mg total) by mouth every 6 (six) hours as needed for severe pain. 09/08/21   Donnie Mesa, MD  ?  OXYGEN Inhale into the lungs. At home at oxygen, uses rarely    [provider]  ?sucralfate (CARAFATE) 1 GM/10ML suspension Take 10 mLs (1 g total) by mouth 3 (three) times daily as needed. ?Patient not taking: Reported on 09/08/2021 07/19/21   Mauri Pole, MD  ?sulfamethoxazole-trimethoprim (BACTRIM DS) 800-160 MG tablet Take 800 tablets by mouth. 09/20/21 09/27/21  [provider]  ? ? ?Physical Exam: ?Vitals:  ? 09/23/21 1611  ?BP: (!) 146/90  ?Pulse: 77  ?Resp: 18  ?Temp: 97.9 ?F (36.6 ?C)  ?TempSrc: Oral  ?SpO2: 98%  ? ?General: 61 y.o. female resting in bed in NAD ?Eyes: PERRL, normal sclera ?ENMT: Nares patent w/o discharge, orophaynx clear, dentition normal, ears w/o discharge/lesions/ulcers ?Neck: Supple, trachea midline ?Cardiovascular: RRR, +S1, S2, no m/g/r, equal pulses throughout ?Respiratory: CTABL, no w/r/r, normal WOB ?GI: BS+, NDNT, no masses noted, no organomegaly noted ?MSK: No  e/c/c, right breast incision is mildly erythematous, but is TTP along with half of that breast ?Neuro: A&O x 3, no focal deficits ?Psyc: Appropriate interaction and affect, calm/cooperative ? ?Data Reviewed: ? ?Results

## 2021-09-23 NOTE — Assessment & Plan Note (Addendum)
06/03/2021:Right lumpectomy: IDC with DCIS grade 1, 1.7 cm, margins negative focal ALH, 0/2 lymph nodes negative, ER 100%, PR 100%, HER2 negative, Ki-67 5% ?Oncotype DX score: 7, distant recurrence at 9 years: 3% ?Adjuvant radiation: 07/10/2021-08/08/2021 ? ?Cellulitis and purulent discharge from the right breast: Outpatient antibiotics were tried but they have not stopped the infection.  She was previously on doxycycline and currently on Bactrim. ?I discussed the case with Dr. Marlou Starks. ?We figured out that the best plan of action would be to admit her to the hospital for few days of IV antibiotics and have Dr. Marlou Starks evaluate her drain situation. ? ?Patient is anxious and worried about the breast not healing and infection getting worse. ? ?

## 2021-09-23 NOTE — Progress Notes (Signed)
? ?Patient Care Team: ?Lorrene Reid, PA-C as PCP - General (Physician Assistant) ?Rourk, Cristopher Estimable, MD as Consulting Physician (Gastroenterology) ?Leandrew Koyanagi, MD as Attending Physician (Orthopedic Surgery) ?Hurley Cisco, MD as Consulting Physician (Rheumatology) ?Mauro Kaufmann, RN as Oncology Nurse Navigator ?Rockwell Germany, RN as Oncology Nurse Navigator ?Jovita Kussmaul, MD as Consulting Physician (General Surgery) ?Nicholas Lose, MD as Consulting Physician (Hematology and Oncology) ?Kyung Rudd, MD as Consulting Physician (Radiation Oncology) ?Alda Berthold, DO as Consulting Physician (Neurology) ? ?DIAGNOSIS:  ?Encounter Diagnosis  ?Name Primary?  ? Malignant neoplasm of upper-outer quadrant of right breast in female, estrogen receptor positive ([place])   ? ? ?SUMMARY OF ONCOLOGIC HISTORY: ?Oncology History  ?Malignant neoplasm of upper-outer quadrant of right breast in female, estrogen receptor positive ([place])  ?03/18/2021 Initial Diagnosis  ? Screening mammogram: a possible mass and distortion in the right breast, Diagnostic mammogram: highly suspicious 0.9 cm upper outer right breast mass. Biopsy: Grade 1 IDC with DCIS ER 100%, PR 1%, HER2 negative by FISH ?  ?03/27/2021 Cancer Staging  ? Staging form: Breast, AJCC 8th Edition ?- Clinical stage from 03/27/2021: Stage IA (cT1b, cN0, cM0, G1, ER+, PR+, HER2-) - Signed by Nicholas Lose, MD on 03/27/2021 ?Stage prefix: Initial diagnosis ?Histologic grading system: 3 grade system ? ?  ? Genetic Testing  ? [facility] CustomNext Panel (47 genes) was Negative. Report date is 04/09/2021. ? ?The CustomNext-Cancer+RNAinsight panel offered by [facility] includes sequencing and rearrangement analysis for the following 47 genes:  APC, ATM, AXIN2, BARD1, BMPR1A, BRCA1, BRCA2, BRIP1, CDH1, CDK4, CDKN2A, CHEK2, CTNNA1, DICER1, EPCAM, GREM1, HOXB13, KIT, MEN1, MLH1, MSH2, MSH3, MSH6, MUTYH, NBN, NF1, NTHL1, PALB2, PDGFRA, PMS2, POLD1, POLE, PTEN, RAD50, RAD51C,  RAD51D, SDHA, SDHB, SDHC, SDHD, SMAD4, SMARCA4, STK11, TP53, TSC1, TSC2, and VHL.  RNA data is routinely analyzed for use in variant interpretation for all genes. ?  ?06/03/2021 Surgery  ? Right lumpectomy: IDC with DCIS grade 1, 1.7 cm, margins negative focal ALH, 0/2 lymph nodes negative, ER 100%, PR 100%, HER2 negative, Ki-67 5% ?  ?06/12/2021 Oncotype testing  ? Oncotype DX score 7: Distant recurrence at 9 years: 3% ?  ?07/10/2021 - 08/08/2021 Radiation Therapy  ? Adjuvant radiation ?  ? ? ?CHIEF COMPLIANT: Right breast infection ?  ? ?INTERVAL HISTORY: [name] is a 61 y.o. with above-mentioned history of right breast cancer. She presents to the clinic today for follow-up. She state that she has pain and discomfort. She state that she did have a fever and she state she got nauseated. She was concern about an infection from her surgery.  She has been on 2 different antibiotics including Bactrim recently and previous to that was doxycycline.  In spite of that her breast has not been healing.  She was seen in the emergency room with Dr. Georgette Dover and has a drain in place but the pus appears to be coming from the stitches and not from the drain ? ? ?ALLERGIES:  is allergic to tramadol. ? ?MEDICATIONS:  ?No current outpatient medications on file.  ? ?No current facility-administered medications for this visit.  ? ? ?PHYSICAL EXAMINATION: ?ECOG PERFORMANCE STATUS: 1 - Symptomatic but completely ambulatory ? ?Vitals:  ? 09/23/21 1347  ?BP: 128/79  ?Pulse: 89  ?Resp: 18  ?Temp: (!) 97.2 ?F (36.2 ?C)  ?SpO2: 100%  ? ?Filed Weights  ? 09/23/21 1347  ?Weight: 220 lb 6.4 oz (100 kg)  ? ? ?BREAST: Purulent drainage from the right  breast with induration and thickening of the right breast with erythema (exam performed in the presence of a chaperone) ? ?LABORATORY DATA:  ?I have reviewed the data as listed ? ?  Latest Ref Rng & Units 09/08/2021  ?  3:06 PM 05/30/2021  ?  3:26 PM 03/27/2021  ? 12:08 PM  ?CMP  ?Glucose 70 - 99 mg/dL 99    94   101    ?BUN 6 - 20 mg/dL '19   13   17    ' ?Creatinine 0.44 - 1.00 mg/dL 1.03   1.18   1.05    ?Sodium 135 - 145 mmol/L 138   139   137    ?Potassium 3.5 - 5.1 mmol/L 3.9   4.1   4.3    ?Chloride 98 - 111 mmol/L 101   98   101    ?CO2 22 - 32 mmol/L '29   29   28    ' ?Calcium 8.9 - 10.3 mg/dL 9.8   9.9   9.6    ?Total Protein 6.5 - 8.1 g/dL   6.9    ?Total Bilirubin 0.3 - 1.2 mg/dL   0.4    ?Alkaline Phos 38 - 126 U/L   80    ?AST 15 - 41 U/L   20    ?ALT 0 - 44 U/L   17    ? ? ?Lab Results  ?Component Value Date  ? WBC 8.5 09/08/2021  ? HGB 12.6 09/08/2021  ? HCT 38.1 09/08/2021  ? MCV 90.7 09/08/2021  ? PLT 414 (H) 09/08/2021  ? NEUTROABS 5.6 09/08/2021  ? ? ?ASSESSMENT & PLAN:  ?Malignant neoplasm of upper-outer quadrant of right breast in female, estrogen receptor positive ([place]) ?06/03/2021:Right lumpectomy: IDC with DCIS grade 1, 1.7 cm, margins negative focal ALH, 0/2 lymph nodes negative, ER 100%, PR 100%, HER2 negative, Ki-67 5% ?Oncotype DX score: 7, distant recurrence at 9 years: 3% ?Adjuvant radiation: 07/10/2021-08/08/2021 ? ?Cellulitis and purulent discharge from the right breast: Outpatient antibiotics were tried but they have not stopped the infection.  She was previously on doxycycline and currently on Bactrim. ?I discussed the case with Dr. Marlou Starks. ?We figured out that the best plan of action would be to admit her to the hospital for few days of IV antibiotics and have Dr. Marlou Starks evaluate her drain situation. ? ?Patient is anxious and worried about the breast not healing and infection getting worse. ? ? ? ?No orders of the defined types were placed in this encounter. ? ?The patient has a good understanding of the overall plan. she agrees with it. she will call with any problems that may develop before the next visit here. ?Total time spent: 30 mins including face to face time and time spent for planning, charting and co-ordination of care ? ? Harriette Ohara, MD ?09/23/21 ? ? ? I Gardiner Coins am  scribing for Dr. Lindi Adie ? ?I have reviewed the above documentation for accuracy and completeness, and I agree with the above. ?  ?

## 2021-09-24 ENCOUNTER — Inpatient Hospital Stay (HOSPITAL_COMMUNITY): Payer: Medicare Other

## 2021-09-24 ENCOUNTER — Ambulatory Visit: Payer: Medicare Other | Admitting: Physician Assistant

## 2021-09-24 ENCOUNTER — Encounter (HOSPITAL_COMMUNITY): Payer: Self-pay | Admitting: Internal Medicine

## 2021-09-24 DIAGNOSIS — N61 Mastitis without abscess: Secondary | ICD-10-CM | POA: Diagnosis not present

## 2021-09-24 LAB — COMPREHENSIVE METABOLIC PANEL
ALT: 44 U/L (ref 0–44)
AST: 18 U/L (ref 15–41)
Albumin: 3.5 g/dL (ref 3.5–5.0)
Alkaline Phosphatase: 104 U/L (ref 38–126)
Anion gap: 11 (ref 5–15)
BUN: 17 mg/dL (ref 6–20)
CO2: 23 mmol/L (ref 22–32)
Calcium: 9.1 mg/dL (ref 8.9–10.3)
Chloride: 101 mmol/L (ref 98–111)
Creatinine, Ser: 0.99 mg/dL (ref 0.44–1.00)
GFR, Estimated: >60 mL/min (ref >60)
Glucose, Bld: 153 mg/dL — ABNORMAL HIGH (ref 70–99)
Potassium: 3.7 mmol/L (ref 3.5–5.1)
Sodium: 135 mmol/L (ref 135–145)
Total Bilirubin: 0.5 mg/dL (ref 0.3–1.2)
Total Protein: 6.9 g/dL (ref 6.5–8.1)

## 2021-09-24 LAB — CBC
HCT: 39.4 % (ref 36.0–46.0)
Hemoglobin: 12.4 g/dL (ref 12.0–15.0)
MCH: 29.5 pg (ref 26.0–34.0)
MCHC: 31.5 g/dL (ref 30.0–36.0)
MCV: 93.6 fL (ref 80.0–100.0)
Platelets: 377 10*3/uL (ref 150–400)
RBC: 4.21 MIL/uL (ref 3.87–5.11)
RDW: 15.3 % (ref 11.5–15.5)
WBC: 11.9 10*3/uL — ABNORMAL HIGH (ref 4.0–10.5)
nRBC: 0 % (ref 0.0–0.2)

## 2021-09-24 LAB — HIV ANTIBODY (ROUTINE TESTING W REFLEX): HIV Screen 4th Generation wRfx: NONREACTIVE

## 2021-09-24 MED ORDER — FLUOXETINE HCL 10 MG PO CAPS
30.0000 mg | ORAL_CAPSULE | Freq: Every day | ORAL | Status: DC
  Administered 2021-09-24 – 2021-09-27 (×4): 30 mg via ORAL
  Filled 2021-09-24 (×4): qty 3

## 2021-09-24 MED ORDER — METOPROLOL SUCCINATE ER 25 MG PO TB24
25.0000 mg | ORAL_TABLET | Freq: Every day | ORAL | Status: DC
  Administered 2021-09-24 – 2021-09-27 (×4): 25 mg via ORAL
  Filled 2021-09-24 (×4): qty 1

## 2021-09-24 MED ORDER — PROCHLORPERAZINE EDISYLATE 10 MG/2ML IJ SOLN
5.0000 mg | Freq: Once | INTRAMUSCULAR | Status: AC
  Administered 2021-09-24: 5 mg via INTRAVENOUS
  Filled 2021-09-24: qty 2

## 2021-09-24 MED ORDER — ONDANSETRON 4 MG PO TBDP: 4.0000 mg | ORAL_TABLET | Freq: Two times a day (BID) | ORAL | Status: DC | PRN

## 2021-09-24 MED ORDER — DOCUSATE SODIUM 50 MG PO CAPS
50.0000 mg | ORAL_CAPSULE | Freq: Once | ORAL | Status: AC
  Administered 2021-09-24: 50 mg via ORAL
  Filled 2021-09-24: qty 1

## 2021-09-24 MED ORDER — HYDROCHLOROTHIAZIDE 25 MG PO TABS
25.0000 mg | ORAL_TABLET | Freq: Every day | ORAL | Status: DC
  Administered 2021-09-24 – 2021-09-27 (×4): 25 mg via ORAL
  Filled 2021-09-24 (×4): qty 1

## 2021-09-24 MED ORDER — HYDROCHLOROTHIAZIDE 25 MG PO TABS: 50.0000 mg | ORAL_TABLET | Freq: Every day | ORAL | Status: DC

## 2021-09-24 NOTE — Progress Notes (Signed)
?PROGRESS NOTE ? ? 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 Marlou Starks 06/03/2021 + adjuvant XRT 3/1 through 08/08/2021 ?--?  Mid April Rx antibiotics Dr. Marlou Starks ?Seen in ED Dr. Georgette Dover 4/30-seroma noted with fat stranding skin thickening?  Infected seroma--underwent 3 cm seroma excision with evacuation Penrose drain placed 4/30 and patient discharged ?Present [facility] ED after follow-up in clinic 5/15-found to have worsening purulent discharge this (despite doxycycline, Bactrim) ? ?Hospital-Problem based course ? ?Infected breast abscess in the underlying setting of stage I breast cancer lumpectomy 05/2021 ?Recent seroma evacuation 4/30 ?Appreciate of general surgery input-await chest CT ?continuing vancomycin and ceftriaxone ?Continuing morphine for severe pain oxycodone for moderate pain ?Saline lock IV ?Right stage I breast cancer followed by Dr. Lindi Adie ?We will CC Dr. Lindi Adie ?Hypertension ?Continue HCTZ but change the dose to 25 [the dosage of 50 mg carries a high risk of salt wasting and does not add to the antihypertensive potential of this medication] ?Hold lisinopril at this time ?Depression ?Continue Prozac 30 daily ? ?DVT prophylaxis: SCDs ?Code Status: Full ?Family Communication: None at bedside ?Disposition:  ?Status is: Inpatient ?Remains inpatient appropriate because:  ? ?Patient is not stable at this time for discharge awaiting chest CT and Nplate foot surgery ?  ?Consultants:  ?General surgery ? ?Procedures: Chest CT is pending ? ?Antimicrobials: Vancomycin and ceftriaxone ? ? ?Subjective: ?Awake alert looks comfortable feels better than she did-no chest pain at this time still states that she has some oozing ?I did not examine the breast wound as it had just been examined by surgery ? ?Objective: ?Vitals:  ? 09/23/21 1728 09/23/21 2057 09/24/21 0012  09/24/21 0411  ?BP:  123/68 (!) 149/73 (!) 151/74  ?Pulse:  80 72 75  ?Resp:  '18 18 18  '$ ?Temp:  97.8 ?F (36.6 ?C) 98.2 ?F (36.8 ?C) 98.4 ?F (36.9 ?C)  ?TempSrc:  Oral Oral Oral  ?SpO2:  97% 99% 94%  ?Weight: 98.5 kg     ?Height: '5\' 3"'$  (1.6 m)     ? ? ?Intake/Output Summary (Last 24 hours) at 09/24/2021 1033 ?Last data filed at 09/24/2021 0251 ?Gross per 24 hour  ?Intake 740 ml  ?Output --  ?Net 740 ml  ? ?Filed Weights  ? 09/23/21 1728  ?Weight: 98.5 kg  ? ? ?Examination: ?EOMI NCAT no focal deficit no wheeze rales rhonchi ?Chest is clear no added sound ?S1-S2 no murmur ?Abdomen soft no rebound no guarding slightly obese ?No lower extremity edema ? ? ? ?Data Reviewed: personally reviewed  ? ?CBC ?   ?Component Value Date/Time  ? WBC 11.9 (H) 09/24/2021 0543  ? RBC 4.21 09/24/2021 0543  ? HGB 12.4 09/24/2021 0543  ? HGB 10.9 (L) 03/27/2021 1208  ? HGB 11.9 06/04/2020 1119  ? HCT 39.4 09/24/2021 0543  ? HCT 36.1 06/04/2020 1119  ? PLT 377 09/24/2021 0543  ? PLT 421 (H) 03/27/2021 1208  ? PLT 357 06/04/2020 1119  ? MCV 93.6 09/24/2021 0543  ? MCV 83 06/04/2020 1119  ? MCH 29.5 09/24/2021 0543  ? MCHC 31.5 09/24/2021 0543  ? RDW 15.3 09/24/2021 0543  ? RDW 16.6 (H) 06/04/2020 1119  ? LYMPHSABS 2.2 09/23/2021 1735  ? LYMPHSABS 1.8 06/04/2020 1119  ? MONOABS 0.9 09/23/2021 1735  ? EOSABS 0.1 09/23/2021 1735  ? EOSABS 0.0 06/04/2020 1119  ? BASOSABS 0.1 09/23/2021 1735  ?  BASOSABS 0.0 06/04/2020 1119  ? ? ?  Latest Ref Rng & Units 09/24/2021  ?  5:43 AM 09/23/2021  ?  5:35 PM 09/08/2021  ?  3:06 PM  ?CMP  ?Glucose 70 - 99 mg/dL 153   103   99    ?BUN 6 - 20 mg/dL '17   17   19    '$ ?Creatinine 0.44 - 1.00 mg/dL 0.99   1.04   1.03    ?Sodium 135 - 145 mmol/L 135   138   138    ?Potassium 3.5 - 5.1 mmol/L 3.7   4.2   3.9    ?Chloride 98 - 111 mmol/L 101   102   101    ?CO2 22 - 32 mmol/L '23   28   29    '$ ?Calcium 8.9 - 10.3 mg/dL 9.1   9.6   9.8    ?Total Protein 6.5 - 8.1 g/dL 6.9   7.3     ?Total Bilirubin 0.3 - 1.2 mg/dL 0.5   0.4      ?Alkaline Phos 38 - 126 U/L 104   107     ?AST 15 - 41 U/L 18   19     ?ALT 0 - 44 U/L 44   53     ? ? ? ?Radiology Studies: ?No results found. ? ? ?Scheduled Meds: ? pantoprazole  40 mg Oral Daily  ? ?Continuous Infusions: ? cefTRIAXone (ROCEPHIN)  IV Stopped (09/23/21 2112)  ? vancomycin    ? ? ? LOS: 1 day  ? ?Time spent: 26 ? ?Nita Sells, MD ?[facility] ?To contact the attending provider between 7A-7P or the covering provider during after hours 7P-7A, please log into the web site [email or web address] and access using universal [place] password for that web site. If you do not have the password, please call the hospital operator. ? ?09/24/2021, 10:33 AM  ? ? ?

## 2021-09-24 NOTE — Progress Notes (Signed)
?  Transition of Care (TOC) Screening Note ? ? ?Patient Details  ?Name: [name] ?Date of Birth: [birth year] ? ? ?Transition of Care (TOC) CM/SW Contact:    ?Mykai Wendorf, Meriam Sprague, RN ?Phone Number: ?09/24/2021, 3:00 PM ? ? ? ?Transition of Care Department [facility]) has reviewed patient and no TOC needs have been identified at this time. We will continue to monitor patient advancement through interdisciplinary progression rounds. If new patient transition needs arise, please place a TOC consult. ?  ?

## 2021-09-24 NOTE — Consult Note (Signed)
? ? ? ?[name] ?[birth year]  ?[id number].   ? ?Requesting MD: Dr. Verneita Griffes ?Chief Complaint/Reason for Consult: breast wound ? ?HPI:  ?This is a 61 yo female with a history of invasive ductal carcinoma who underwent a right breast lumpectomy with sentinel lymph node biopsy by Dr. Marlou Starks on 06/03/21.  She then underwent radiation to this breast.  She developed progressively worsening pain, swelling, and induration since finishing her radiation.  She was started on antibiotics by Dr. Marlou Starks, but symptoms worsened.  She presented to the ED where she had a CT scan that revealed a large seroma with fat stranding and skin thickening.  She then underwent an I&D of this seroma with placement of a 1/4" penrose drain by Dr. Georgette Dover on 09/08/21.  She has had several other courses of abx therapy including Bactrim and doxy.  She continues to have drainage and was sent to the ED yesterday by her oncologist for IV abx therapy.  We have been asked to see her for further evaluation and recommendations. ? ?ROS: ?ROS: Please see HPI. ? ?Family History  ?Problem Relation Age of Onset  ? Stroke Mother   ? Lung cancer Father 21  ? GI problems Father   ? Prostate cancer Father   ? Cancer Sister   ?     breast  ? Breast cancer Sister 44  ? Lung cancer Maternal Grandfather   ? Lung cancer Paternal Grandfather   ? Diabetes Son   ? Colon cancer Neg Hx   ? Esophageal cancer Neg Hx   ? Pancreatic cancer Neg Hx   ? ? ?Past Medical History:  ?Diagnosis Date  ? Acid reflux   ? takes Zantac and Omeprazole daily  ? Anemia   ? Anxiety   ? takes Citaopram daily  ? Arthritis   ? right knee  ? 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 Chronic back pain   ? DDD  ? Clotting disorder ([place])   ? hx of blood clot following knee scope  ? Depression   ? Fibromyalgia   ? History of bronchitis 3+yrs ago  ? Hyperlipidemia   ? takes Atorvastatin daily  ? Hypertension   ? takes Lisinopril and HCTZ daily  ? Insomnia   ? takes Elavil nightly as needed  ? Paraesophageal hernia    ? ? ?Past Surgical History:  ?Procedure Laterality Date  ? BREAST BIOPSY Left   ? BREAST LUMPECTOMY WITH RADIOACTIVE SEED AND SENTINEL LYMPH NODE BIOPSY Right 06/03/2021  ? Procedure: RIGHT BREAST LUMPECTOMY WITH RADIOACTIVE SEED AND SENTINEL LYMPH NODE BIOPSY;  Surgeon: Jovita Kussmaul, MD;  Location: [place];  Service: General;  Laterality: Right;  ? BUNIONECTOMY Right 02/18/2018  ? Procedure: Yehuda Budd;  Surgeon: Edrick Kins, DPM;  Location: [place];  Service: Podiatry;  Laterality: Right;  ? CAPSULOTOMY Bilateral 02/18/2018  ? Procedure: CAPSULOTOMY MPJ RELEASE JOINT 2N BILATERAL;  Surgeon: Edrick Kins, DPM;  Location: [place];  Service: Podiatry;  Laterality: Bilateral;  ? CARPOMETACARPEL SUSPENSION PLASTY Left 01/27/2018  ? Procedure: LEFT THUMB ligament reconstruction and tendon interposition;  Surgeon: Leandrew Koyanagi, MD;  Location: [place];  Service: Orthopedics;  Laterality: Left;  ? CARPOMETACARPEL SUSPENSION PLASTY Left 03/07/2020  ? Procedure: REVISION LEFT THUMB CARPOMETACARPAL ([place]) ARTHROPLASTY;  Surgeon: Leandrew Koyanagi, MD;  Location: [place];  Service: Orthopedics;  Laterality: Left;  ? CHONDROPLASTY Right 06/28/2014  ? Procedure: CHONDROPLASTY;  Surgeon:  Naiping Eduard Roux, MD;  Location: [place];  Service: Orthopedics;  Laterality: Right;  ? COLONOSCOPY N/A 05/29/2014  ? Procedure: COLONOSCOPY;  Surgeon: Daneil Dolin, MD;  [facility];  Service: Endoscopy;  Laterality: N/A;  215pm- Pt is working until 12:00 so she can't come any earlier  ? ESOPHAGOGASTRODUODENOSCOPY N/A 05/29/2014  ? Procedure: ESOPHAGOGASTRODUODENOSCOPY (EGD);  Surgeon: Daneil Dolin, MD;  [facility];  Service: Endoscopy;  Laterality: N/A;  ? ESOPHAGOGASTRODUODENOSCOPY N/A 12/17/2020  ? Procedure: ESOPHAGOGASTRODUODENOSCOPY (EGD);  Surgeon: Lajuana Matte, MD;  Location: [place];  Service: Thoracic;  Laterality: N/A;   ? GANGLION CYST EXCISION Left 01/13/2002  ? HAMMER TOE SURGERY Bilateral 02/18/2018  ? Procedure: HAMMER TOE CORRECTION2ND BILATERAL;  Surgeon: Edrick Kins, DPM;  Location: [place];  Service: Podiatry;  Laterality: Bilateral;  ? HERNIA REPAIR    ? IRRIGATION AND DEBRIDEMENT ABSCESS Right 09/08/2021  ? Procedure: IRRIGATION AND DEBRIDEMENT RIGHT BREAST ABSCESS;  Surgeon: Donnie Mesa, MD;  [facility];  Service: General;  Laterality: Right;  ? KNEE ARTHROSCOPY WITH MEDIAL MENISECTOMY Right 06/28/2014  ? Procedure: RIGHT KNEE ARTHROSCOPY WITH PARTIAL MEDIAL MENISCECTOMY AND CHONDROPLASTY;  Surgeon: Marianna Payment, MD;  Location: [place];  Service: Orthopedics;  Laterality: Right;  ? MALONEY DILATION N/A 05/29/2014  ? Procedure: MALONEY DILATION;  Surgeon: Daneil Dolin, MD;  [facility];  Service: Endoscopy;  Laterality: N/A;  ? PARTIAL KNEE ARTHROPLASTY Right 09/15/2014  ? Procedure: RIGHT UNICOMPARTMENTAL KNEE ARTHROPLASTY;  Surgeon: Leandrew Koyanagi, MD;  Location: [facility];  Service: Orthopedics;  Laterality: Right;  ? SHOULDER ARTHROSCOPY Right   ? TOTAL KNEE ARTHROPLASTY Left 04/08/2004  ? XI ROBOTIC ASSISTED PARAESOPHAGEAL HERNIA REPAIR N/A 12/17/2020  ? Procedure: XI ROBOTIC ASSISTED Laparoscopy PARAESOPHAGEAL HERNIA REPAIR WITH FUNDOPLICATION;  Surgeon: Lajuana Matte, MD;  Location: [place];  Service: Thoracic;  Laterality: N/A;  ? ? ?Social History:  reports that she has never smoked. She has never used smokeless tobacco. She reports that she does not drink alcohol and does not use drugs. ? ?Allergies:  ?Allergies  ?Allergen Reactions  ? Tramadol Itching  ?  "bugs crawling all over"  ? ? ?Medications Prior to Admission  ?Medication Sig Dispense Refill  ? FLUoxetine (PROZAC) 20 MG tablet Take 1.5 tablets (30 mg total) by mouth daily. Take 1.5 tablet by mouth daily. 10 tablet 0  ? hydrochlorothiazide (HYDRODIURIL) 50 MG tablet Take 1 tablet (50 mg total) by mouth daily.  10 tablet 0  ? lisinopril (ZESTRIL) 40 MG tablet Take 1 tablet (40 mg total) by mouth daily. 10 tablet 0  ? metoprolol succinate (TOPROL-XL) 25 MG 24 hr tablet Take 1 tablet (25 mg total) by mouth daily. **PLEASE CONTACT OUR OFFICE TO SCHEDULE A FOLLOW UP FOR FUTURE MED REFILLS** 10 tablet 0  ? ondansetron (ZOFRAN-ODT) 4 MG disintegrating tablet Take 1 tablet (4 mg total) by mouth 2 (two) times daily as needed for nausea or vomiting. 30 tablet 1  ? oxyCODONE (OXY IR/ROXICODONE) 5 MG immediate release tablet Take 1 tablet (5 mg total) by mouth every 6 (six) hours as needed for severe pain. 12 tablet 0  ? sulfamethoxazole-trimethoprim (BACTRIM DS) 800-160 MG tablet Take 800 tablets by mouth 2 (two) times daily.    ? letrozole (FEMARA) 2.5 MG tablet Take 1 tablet (2.5 mg total) by mouth daily. 90 tablet 3  ? OXYGEN Inhale into the lungs. At home at oxygen,  uses rarely    ? sucralfate (CARAFATE) 1 GM/10ML suspension Take 10 mLs (1 g total) by mouth 3 (three) times daily as needed. (Patient not taking: Reported on 09/08/2021) 420 mL 0  ? ? ? ?Physical Exam: ?Blood pressure (!) 151/74, pulse 75, temperature 98.4 ?F (36.9 ?C), temperature source Oral, resp. rate 18, height '5\' 3"'$  (1.6 m), weight 98.5 kg, last menstrual period 02/07/2013, SpO2 94 %. ?General: pleasant, WD, WN female who is laying in bed in NAD ?HEENT: head is normocephalic, atraumatic.  Sclera are noninjected.  PERRL.  Ears and nose without any masses or lesions.  Mouth is pink and moist ?Heart: regular, rate, and rhythm.  Normal s1,s2. No obvious murmurs, gallops, or rubs noted.  Palpable radial and pedal pulses bilaterally ?Lungs: CTAB, no wheezes, rhonchi, or rales noted.  Respiratory effort nonlabored ?Breast: incision looks good. Indurated but no cellulitis. Penrose still in place. Still tender ?Abd: soft, NT, ND, +BS, no masses, hernias, or organomegaly ?MS: all 4 extremities are symmetrical with no cyanosis, clubbing, or edema. ?Skin: warm and dry with  no masses, lesions, or rashes ?Neuro: Cranial nerves 2-12 grossly intact, sensation is normal throughout ?Psych: A&Ox3 with an appropriate affect. ? ? ?Results for orders placed or performed during th

## 2021-09-25 DIAGNOSIS — N61 Mastitis without abscess: Secondary | ICD-10-CM | POA: Diagnosis not present

## 2021-09-25 LAB — COMPREHENSIVE METABOLIC PANEL
ALT: 35 U/L (ref 0–44)
AST: 18 U/L (ref 15–41)
Albumin: 3.5 g/dL (ref 3.5–5.0)
Alkaline Phosphatase: 100 U/L (ref 38–126)
Anion gap: 9 (ref 5–15)
BUN: 12 mg/dL (ref 6–20)
CO2: 26 mmol/L (ref 22–32)
Calcium: 9.6 mg/dL (ref 8.9–10.3)
Chloride: 102 mmol/L (ref 98–111)
Creatinine, Ser: 0.95 mg/dL (ref 0.44–1.00)
GFR, Estimated: >60 mL/min (ref >60)
Glucose, Bld: 137 mg/dL — ABNORMAL HIGH (ref 70–99)
Potassium: 3.8 mmol/L (ref 3.5–5.1)
Sodium: 137 mmol/L (ref 135–145)
Total Bilirubin: 0.7 mg/dL (ref 0.3–1.2)
Total Protein: 6.8 g/dL (ref 6.5–8.1)

## 2021-09-25 LAB — CBC WITH DIFFERENTIAL/PLATELET
Abs Immature Granulocytes: 0.04 10*3/uL (ref 0.00–0.07)
Basophils Absolute: 0.1 10*3/uL (ref 0.0–0.1)
Basophils Relative: 1 %
Eosinophils Absolute: 0.1 10*3/uL (ref 0.0–0.5)
Eosinophils Relative: 1 %
HCT: 39.2 % (ref 36.0–46.0)
Hemoglobin: 12.7 g/dL (ref 12.0–15.0)
Immature Granulocytes: 0 %
Lymphocytes Relative: 20 %
Lymphs Abs: 1.8 10*3/uL (ref 0.7–4.0)
MCH: 29.6 pg (ref 26.0–34.0)
MCHC: 32.4 g/dL (ref 30.0–36.0)
MCV: 91.4 fL (ref 80.0–100.0)
Monocytes Absolute: 1.1 10*3/uL — ABNORMAL HIGH (ref 0.1–1.0)
Monocytes Relative: 12 %
Neutro Abs: 5.9 10*3/uL (ref 1.7–7.7)
Neutrophils Relative %: 66 %
Platelets: 367 10*3/uL (ref 150–400)
RBC: 4.29 MIL/uL (ref 3.87–5.11)
RDW: 15.4 % (ref 11.5–15.5)
WBC: 9 10*3/uL (ref 4.0–10.5)
nRBC: 0 % (ref 0.0–0.2)

## 2021-09-25 MED ORDER — POLYETHYLENE GLYCOL 3350 17 G PO PACK
17.0000 g | PACK | Freq: Once | ORAL | Status: AC
  Administered 2021-09-25: 17 g via ORAL
  Filled 2021-09-25: qty 1

## 2021-09-25 NOTE — Plan of Care (Signed)
?  Problem: Education: ?Goal: Knowledge of General Education information will improve ?Description: Including pain rating scale, medication(s)/side effects and non-pharmacologic comfort measures ?Outcome: Progressing ?  ?Problem: Health Behavior/Discharge Planning: ?Goal: Ability to manage health-related needs will improve ?Outcome: Progressing ?  ?Problem: Clinical Measurements: ?Goal: Ability to maintain clinical measurements within normal limits will improve ?Outcome: Progressing ?Goal: Will remain free from infection ?Outcome: Progressing ?Goal: Diagnostic test results will improve ?Outcome: Progressing ?Goal: Respiratory complications will improve ?Outcome: Progressing ?Goal: Cardiovascular complication will be avoided ?Outcome: Progressing ?  ?Problem: Activity: ?Goal: Risk for activity intolerance will decrease ?Outcome: Progressing ?  ?Problem: Nutrition: ?Goal: Adequate nutrition will be maintained ?Outcome: Progressing ?  ?Problem: Coping: ?Goal: Level of anxiety will decrease ?Outcome: Progressing ?  ?Problem: Elimination: ?Goal: Will not experience complications related to urinary retention ?Outcome: Progressing ?  ?Problem: Pain Managment: ?Goal: General experience of comfort will improve ?Outcome: Progressing ?  ?Problem: Safety: ?Goal: Ability to remain free from injury will improve ?Outcome: Progressing ?  ?Problem: Skin Integrity: ?Goal: Risk for impaired skin integrity will decrease ?Outcome: Progressing ?  ? ?Pt resting comfortably in bed at this time.  Pain is 0/10 after taking PRNs early in the morning.  Up ad lib.  Encouraged patient to walk with assistance today to help with bowel motility. ?

## 2021-09-25 NOTE — Progress Notes (Signed)
?PROGRESS NOTE ? ? ? ?[name]  WCH:[id number] DOB: 1960-09-17 DOA: 09/23/2021 ?PCP: Lorrene Reid, PA-C  ?Brief Narrative: 62 year old female admitted with right breast abscess.  She is admitted with purulent discharge despite being on doxycycline and Bactrim as an outpatient.  She has history of right breast cancer status postlumpectomy and XRT. ?Infected seroma 09/08/2021 seen in ED by Dr.tsuie underwent excision and evacuation with Penrose drain in place and patient was discharged.   ? ?Assessment & Plan: ?  ?Principal Problem: ?  Breast infection ?Active Problems: ?  GERD (gastroesophageal reflux disease) ?  Essential hypertension ?  Anxiety ?  Malignant neoplasm of upper-outer quadrant of right breast in female, estrogen receptor positive ([place]) ? ? ?#1 right breast abscess status post recent seroma evacuation 09/08/2021. ?General surgery following. ?On vancomycin and Rocephin.  Blood cultures no growth to date ?I do not see any wound cultures ?Chest CT does not show any undrained area of abscess or fluid. ?Pain control with morphine and oxycodone. ? ?#2 hypertension blood pressure 134/70 ?On HCTZ and metoprolol ? ?#3 depression on Prozac ? ? ? ?Estimated body mass index is 38.47 kg/m? as calculated from the following: ?  Height as of this encounter: '5\' 3"'$  (1.6 m). ?  Weight as of this encounter: 98.5 kg. ? ?DVT prophylaxis: SCD  ?code Status: Full code ?Family Communication: None at bedside ?Disposition Plan:  Status is: Inpatient ?Remains inpatient appropriate because: Right breast abscess on IV antibiotics ?  ?Consultants:  ?General surgery ? ?Procedures: None ?Antimicrobials: Vancomycin Rocephin ? ?Subjective: ?Patient resting in bed requesting diet to be changed to regular diet as she is not eating much from the heart healthy menu. ?Repeat CT chest shows no concern for undrained abscess. ? ?Objective: ?Vitals:  ? 09/24/21 2032 09/25/21 0520 09/25/21 0928 09/25/21 1426  ?BP: (!) 149/75 139/66 130/74 134/70   ?Pulse: 64 69 91 73  ?Resp: 18   18  ?Temp: 98.2 ?F (36.8 ?C) 98.2 ?F (36.8 ?C)  98.3 ?F (36.8 ?C)  ?TempSrc: Oral Oral  Oral  ?SpO2: 97% 95%  94%  ?Weight:      ?Height:      ? ? ?Intake/Output Summary (Last 24 hours) at 09/25/2021 1436 ?Last data filed at 09/25/2021 0900 ?Gross per 24 hour  ?Intake --  ?Output 100 ml  ?Net -100 ml  ? ?Filed Weights  ? 09/23/21 1728  ?Weight: 98.5 kg  ? ? ?Examination: ? ?General exam: Appears calm and comfortable  ?Respiratory system: Clear to auscultation. Respiratory effort normal.  Right breast covered with a dressing ?Cardiovascular system: S1 & S2 heard, RRR. No JVD, murmurs, rubs, gallops or clicks. No pedal edema. ?Gastrointestinal system: Abdomen is nondistended, soft and nontender. No organomegaly or masses felt. Normal bowel sounds heard. ?Central nervous system: Alert and oriented. No focal neurological deficits. ?Extremities: Symmetric 5 x 5 power. ?Skin: No rashes, lesions or ulcers ?Psychiatry: Judgement and insight appear normal. Mood & affect appropriate.  ? ? ? ?Data Reviewed: I have personally reviewed following labs and imaging studies ? ?CBC: ?Recent Labs  ?Lab 09/23/21 ?1735 09/24/21 ?3536 09/25/21 ?0542  ?WBC 8.3 11.9* 9.0  ?NEUTROABS 4.9  --  5.9  ?HGB 12.4 12.4 12.7  ?HCT 38.7 39.4 39.2  ?MCV 91.5 93.6 91.4  ?PLT 390 377 367  ? ?Basic Metabolic Panel: ?Recent Labs  ?Lab 09/23/21 ?1735 09/24/21 ?1443 09/25/21 ?0542  ?NA 138 135 137  ?K 4.2 3.7 3.8  ?CL 102 101 102  ?CO2 28  23 26  ?GLUCOSE 103* 153* 137*  ?BUN '17 17 12  '$ ?CREATININE 1.04* 0.99 0.95  ?CALCIUM 9.6 9.1 9.6  ? ?GFR: ?Estimated Creatinine Clearance: 71.3 mL/min (by C-G formula based on SCr of 0.95 mg/dL). ?Liver Function Tests: ?Recent Labs  ?Lab 09/23/21 ?1735 09/24/21 ?4496 09/25/21 ?0542  ?AST '19 18 18  '$ ?ALT 53* 44 35  ?ALKPHOS 107 104 100  ?BILITOT 0.4 0.5 0.7  ?PROT 7.3 6.9 6.8  ?ALBUMIN 3.8 3.5 3.5  ? ?No results for input(s): LIPASE, AMYLASE in the last 168 hours. ?No results for input(s):  AMMONIA in the last 168 hours. ?Coagulation Profile: ?No results for input(s): INR, PROTIME in the last 168 hours. ?Cardiac Enzymes: ?No results for input(s): CKTOTAL, CKMB, CKMBINDEX, TROPONINI in the last 168 hours. ?BNP (last 3 results) ?No results for input(s): PROBNP in the last 8760 hours. ?HbA1C: ?No results for input(s): HGBA1C in the last 72 hours. ?CBG: ?No results for input(s): GLUCAP in the last 168 hours. ?Lipid Profile: ?No results for input(s): CHOL, HDL, LDLCALC, TRIG, CHOLHDL, LDLDIRECT in the last 72 hours. ?Thyroid Function Tests: ?No results for input(s): TSH, T4TOTAL, FREET4, T3FREE, THYROIDAB in the last 72 hours. ?Anemia Panel: ?No results for input(s): VITAMINB12, FOLATE, FERRITIN, TIBC, IRON, RETICCTPCT in the last 72 hours. ?Sepsis Labs: ?No results for input(s): PROCALCITON, LATICACIDVEN in the last 168 hours. ? ?Recent Results (from the past 240 hour(s))  ?Culture, blood (Routine X 2) w Reflex to ID Panel     Status: None (Preliminary result)  ? Collection Time: 09/23/21  6:17 PM  ? Specimen: BLOOD  ?Result Value Ref Range Status  ? Specimen Description   Final  ?  BLOOD LEFT ANTECUBITAL ?Performed at [facility], [place] [address], [place], [place] [address] ?  ? Special Requests   Final  ?  BOTTLES DRAWN AEROBIC ONLY Blood Culture adequate volume ?Performed at [facility], [place] [address] [place], [place] [address] ?  ? Culture   Final  ?  NO GROWTH 2 DAYS ?Performed at [facility], [place] [address], [place], [place] [address] ?  ? Report Status PENDING  Incomplete  ?Culture, blood (Routine X 2) w Reflex to ID Panel     Status: None (Preliminary result)  ? Collection Time: 09/23/21  6:22 PM  ? Specimen: BLOOD  ?Result Value Ref Range Status  ? Specimen Description   Final  ?  BLOOD BLOOD LEFT HAND ?Performed at [facility], [place] [address], [place], [place] [address] ?  ? Special Requests   Final  ?  IN PEDIATRIC BOTTLE Blood  Culture adequate volume ?Performed at [facility], [place] [address] [place], [place] [address] ?  ? Culture   Final  ?  NO GROWTH 2 DAYS ?Performed at [facility], [place] [address] [place], [place] [address] ?  ? Report Status PENDING  Incomplete  ?  ? ? ? ? ? ?Radiology Studies: ?CT CHEST WO CONTRAST ? ?Result Date: 09/24/2021 ?CLINICAL DATA:  Breast abscess EXAM: CT CHEST WITHOUT CONTRAST TECHNIQUE: Multidetector CT imaging of the chest was performed following the standard protocol without IV contrast. RADIATION DOSE REDUCTION: This exam was performed according to the departmental dose-optimization program which includes automated exposure control, adjustment of the mA and/or kV according to patient size and/or use of iterative reconstruction technique. COMPARISON:  09/08/2021 FINDINGS: Cardiovascular: Heart size normal. Trace pericardial fluid. Aortic Atherosclerosis (ICD10-170.0). Mediastinum/Nodes: Small hiatal hernia.  No mass or adenopathy. Lungs/Pleura: No pleural effusion. No pneumothorax. 7 mm anterior right upper lobe nodule (Im54,Se5) , stable since 02/28/2019. Linear scarring or atelectasis in the medial basal segment left lower lobe. lungs otherwise clear. Upper Abdomen: 3.6 cm 7 HU right adrenal nodule likely adenoma, stable since 02/28/2019. partially calcified gallstones measuring up to 2.7 cm in the nondilated gallbladder. No acute findings. Musculoskeletal: Interval placement of drain catheter into the previously noted right breast collection, with evacuation of the fluid component. No residual or undrained fluid component identified. Postop changes in the right breast with skin thickening. Regional bones unremarkable. IMPRESSION: 1. Interval evacuation of right breast abscess by percutaneous drain catheter. 2. 7 mm right solid pulmonary nodule within the upper lobe, stable since 2020. If patient is high risk for malignancy, recommend an additional non-contrast Chest CT at  18-24 months; if patient is low risk for malignancy a non-contrast Chest CT at 18-24 months is optional. These guidelines do not apply to immunocompromised patients and patients with cancer. Follow up

## 2021-09-25 NOTE — Progress Notes (Signed)
? ?Subjective/Chief Complaint: ?Feels a little better than yesterday.  ? ? ?Objective: ?Vital signs in last 24 hours: ?Temp:  [98.2 ?F (36.8 ?C)-98.3 ?F (36.8 ?C)] 98.3 ?F (36.8 ?C) (05/17 1426) ?Pulse Rate:  [64-91] 73 (05/17 1426) ?Resp:  [18] 18 (05/17 1426) ?BP: (130-149)/(66-75) 134/70 (05/17 1426) ?SpO2:  [94 %-97 %] 94 % (05/17 1426) ?Last BM Date : 09/22/21 ? ?Intake/Output from previous day: ?05/16 0701 - 05/17 0700 ?In: 240 [P.O.:240] ?Out: -  ?Intake/Output this shift: ?Total I/O ?In: -  ?Out: 100 [Urine:100] ? ?General appearance: alert and cooperative ?Resp: clear to auscultation bilaterally ?Breasts: tender at drain site ?Cardio: regular rate and rhythm ?GI: soft, non-tender; bowel sounds normal; no masses,  no organomegaly ? ?Lab Results:  ?Recent Labs  ?  09/24/21 ?4098 09/25/21 ?0542  ?WBC 11.9* 9.0  ?HGB 12.4 12.7  ?HCT 39.4 39.2  ?PLT 377 367  ? ?BMET ?Recent Labs  ?  09/24/21 ?1191 09/25/21 ?0542  ?NA 135 137  ?K 3.7 3.8  ?CL 101 102  ?CO2 23 26  ?GLUCOSE 153* 137*  ?BUN 17 12  ?CREATININE 0.99 0.95  ?CALCIUM 9.1 9.6  ? ?PT/INR ?No results for input(s): LABPROT, INR in the last 72 hours. ?ABG ?No results for input(s): PHART, HCO3 in the last 72 hours. ? ?Invalid input(s): PCO2, PO2 ? ?Studies/Results: ?CT CHEST WO CONTRAST ? ?Result Date: 09/24/2021 ?CLINICAL DATA:  Breast abscess EXAM: CT CHEST WITHOUT CONTRAST TECHNIQUE: Multidetector CT imaging of the chest was performed following the standard protocol without IV contrast. RADIATION DOSE REDUCTION: This exam was performed according to the departmental dose-optimization program which includes automated exposure control, adjustment of the mA and/or kV according to patient size and/or use of iterative reconstruction technique. COMPARISON:  09/08/2021 FINDINGS: Cardiovascular: Heart size normal. Trace pericardial fluid. Aortic Atherosclerosis (ICD10-170.0). Mediastinum/Nodes: Small hiatal hernia.  No mass or adenopathy. Lungs/Pleura: No pleural  effusion. No pneumothorax. 7 mm anterior right upper lobe nodule (Im54,Se5) , stable since 02/28/2019. Linear scarring or atelectasis in the medial basal segment left lower lobe. lungs otherwise clear. Upper Abdomen: 3.6 cm 7 HU right adrenal nodule likely adenoma, stable since 02/28/2019. partially calcified gallstones measuring up to 2.7 cm in the nondilated gallbladder. No acute findings. Musculoskeletal: Interval placement of drain catheter into the previously noted right breast collection, with evacuation of the fluid component. No residual or undrained fluid component identified. Postop changes in the right breast with skin thickening. Regional bones unremarkable. IMPRESSION: 1. Interval evacuation of right breast abscess by percutaneous drain catheter. 2. 7 mm right solid pulmonary nodule within the upper lobe, stable since 2020. If patient is high risk for malignancy, recommend an additional non-contrast Chest CT at 18-24 months; if patient is low risk for malignancy a non-contrast Chest CT at 18-24 months is optional. These guidelines do not apply to immunocompromised patients and patients with cancer. Follow up in patients with significant comorbidities as clinically warranted. For lung cancer screening, adhere to Lung-RADS guidelines. Reference: Radiology. 2017; 284(1):228-43. 3. Cholelithiasis Electronically Signed   By: Lucrezia Europe M.D.   On: 09/24/2021 14:35   ? ?Anti-infectives: ?Anti-infectives (From admission, onward)  ? ? Start     Dose/Rate Route Frequency Ordered Stop  ? 09/24/21 1930  vancomycin (VANCOREADY) IVPB 1250 mg/250 mL       ? 1,250 mg ?166.7 mL/hr over 90 Minutes Intravenous Every 24 hours 09/23/21 1806    ? 09/23/21 1900  vancomycin (VANCOREADY) IVPB 2000 mg/400 mL       ?  2,000 mg ?200 mL/hr over 120 Minutes Intravenous  Once 09/23/21 1804 09/23/21 2332  ? 09/23/21 1845  cefTRIAXone (ROCEPHIN) 1 g in sodium chloride 0.9 % 100 mL IVPB       ? 1 g ?200 mL/hr over 30 Minutes Intravenous  Every 24 hours 09/23/21 1749 09/30/21 1844  ? ?  ? ? ?Assessment/Plan: ?s/p * No surgery found * ?Advance diet ?CT showed good evacuation of abscess cavity ?Continue IV abx although no fever and normal wbc but pain seems to be improving with abx ?Leave drain in for now ? LOS: 2 days  ? ? ?[name] ?09/25/2021 ? ?

## 2021-09-26 DIAGNOSIS — N61 Mastitis without abscess: Secondary | ICD-10-CM | POA: Diagnosis not present

## 2021-09-26 LAB — CREATININE, SERUM
Creatinine, Ser: 0.97 mg/dL (ref 0.44–1.00)
GFR, Estimated: >60 mL/min (ref >60)

## 2021-09-26 NOTE — Progress Notes (Signed)
Subjective/Chief Complaint: Slowly improving   Objective: Vital signs in last 24 hours: Temp:  [97.8 F (36.6 C)-98.3 F (36.8 C)] 97.8 F (36.6 C) (05/18 0525) Pulse Rate:  [66-73] 66 (05/18 0525) Resp:  [16-18] 16 (05/18 0525) BP: (134-141)/(68-88) 138/68 (05/18 0525) SpO2:  [93 %-94 %] 93 % (05/18 0525) Last BM Date : 09/25/21  Intake/Output from previous day: 05/17 0701 - 05/18 0700 In: -  Out: 100 [Urine:100] Intake/Output this shift: Total I/O In: 713.6 [IV Piggyback:713.6] Out: -   General appearance: alert and cooperative Resp: clear to auscultation bilaterally Breasts: tender RUQ, drain intact Cardio: regular rate and rhythm GI: soft, non-tender; bowel sounds normal; no masses,  no organomegaly  Lab Results:  Recent Labs    09/24/21 0543 09/25/21 0542  WBC 11.9* 9.0  HGB 12.4 12.7  HCT 39.4 39.2  PLT 377 367   BMET Recent Labs    09/24/21 0543 09/25/21 0542 09/26/21 0539  NA 135 137  --   K 3.7 3.8  --   CL 101 102  --   CO2 23 26  --   GLUCOSE 153* 137*  --   BUN 17 12  --   CREATININE 0.99 0.95 0.97  CALCIUM 9.1 9.6  --    PT/INR No results for input(s): LABPROT, INR in the last 72 hours. ABG No results for input(s): PHART, HCO3 in the last 72 hours.  Invalid input(s): PCO2, PO2  Studies/Results: CT CHEST WO CONTRAST  Result Date: 09/24/2021 CLINICAL DATA:  Breast abscess EXAM: CT CHEST WITHOUT CONTRAST TECHNIQUE: Multidetector CT imaging of the chest was performed following the standard protocol without IV contrast. RADIATION DOSE REDUCTION: This exam was performed according to the departmental dose-optimization program which includes automated exposure control, adjustment of the mA and/or kV according to patient size and/or use of iterative reconstruction technique. COMPARISON:  09/08/2021 FINDINGS: Cardiovascular: Heart size normal. Trace pericardial fluid. Aortic Atherosclerosis (ICD10-170.0). Mediastinum/Nodes: Small hiatal  hernia.  No mass or adenopathy. Lungs/Pleura: No pleural effusion. No pneumothorax. 7 mm anterior right upper lobe nodule (Im54,Se5) , stable since 02/28/2019. Linear scarring or atelectasis in the medial basal segment left lower lobe. lungs otherwise clear. Upper Abdomen: 3.6 cm 7 HU right adrenal nodule likely adenoma, stable since 02/28/2019. partially calcified gallstones measuring up to 2.7 cm in the nondilated gallbladder. No acute findings. Musculoskeletal: Interval placement of drain catheter into the previously noted right breast collection, with evacuation of the fluid component. No residual or undrained fluid component identified. Postop changes in the right breast with skin thickening. Regional bones unremarkable. IMPRESSION: 1. Interval evacuation of right breast abscess by percutaneous drain catheter. 2. 7 mm right solid pulmonary nodule within the upper lobe, stable since 2020. If patient is high risk for malignancy, recommend an additional non-contrast Chest CT at 18-24 months; if patient is low risk for malignancy a non-contrast Chest CT at 18-24 months is optional. These guidelines do not apply to immunocompromised patients and patients with cancer. Follow up in patients with significant comorbidities as clinically warranted. For lung cancer screening, adhere to Lung-RADS guidelines. Reference: Radiology. 2017; 284(1):228-43. 3. Cholelithiasis Electronically Signed   By: Lucrezia Europe M.D.   On: 09/24/2021 14:35    Anti-infectives: Anti-infectives (From admission, onward)    Start     Dose/Rate Route Frequency Ordered Stop   09/24/21 1930  vancomycin (VANCOREADY) IVPB 1250 mg/250 mL        1,250 mg 166.7 mL/hr over 90 Minutes Intravenous Every 24  hours 09/23/21 1806     09/23/21 1900  vancomycin (VANCOREADY) IVPB 2000 mg/400 mL        2,000 mg 200 mL/hr over 120 Minutes Intravenous  Once 09/23/21 1804 09/23/21 2332   09/23/21 1845  cefTRIAXone (ROCEPHIN) 1 g in sodium chloride 0.9 % 100 mL  IVPB        1 g 200 mL/hr over 30 Minutes Intravenous Every 24 hours 09/23/21 1749 09/30/21 1844       Assessment/Plan: s/p * No surgery found * Continue abx. Consider switching to orals soon She will keep drain at d/c. Plan for her to follow up with me about a week after d/c and I will consider removing drain and starting packing of wound  LOS: 3 days    [name] 09/26/2021

## 2021-09-26 NOTE — Progress Notes (Signed)
PROGRESS NOTE    [name]  QBH:[id number] DOB: November 01, 1960 DOA: 09/23/2021 PCP: Lorrene Reid, PA-C  Brief Narrative: 61 year old female admitted with right breast abscess.  She is admitted with purulent discharge despite being on doxycycline and Bactrim as an outpatient.  She has history of right breast cancer status postlumpectomy and XRT. Infected seroma 09/08/2021 seen in ED by Dr.tsuie underwent excision and evacuation with Penrose drain in place and patient was discharged.    Assessment & Plan:   Principal Problem:   Breast infection Active Problems:   GERD (gastroesophageal reflux disease)   Essential hypertension   Anxiety   Malignant neoplasm of upper-outer quadrant of right breast in female, estrogen receptor positive ([place])   #1 right breast abscess status post recent seroma evacuation 09/08/2021. General surgery following. On vancomycin and Rocephin.  Blood cultures no growth to date I do not see any wound cultures Chest CT does not show any undrained area of abscess or fluid. Pain control with morphine and oxycodone. Await surgical clearance for discharge home  #2 hypertension blood pressure 134/70 On HCTZ and metoprolol  #3 depression on Prozac    Estimated body mass index is 38.47 kg/m as calculated from the following:   Height as of this encounter: '5\' 3"'$  (1.6 m).   Weight as of this encounter: 98.5 kg.  DVT prophylaxis: SCD  code Status: Full code Family Communication: None at bedside Disposition Plan:  Status is: Inpatient Remains inpatient appropriate because: Right breast abscess on IV antibiotics   Consultants:  General surgery  Procedures: None Antimicrobials: Vancomycin Rocephin  Subjective:  She is resting in bed Continues to have pain and drainage from the right breast remains on IV vancomycin and Rocephin  Objective: Vitals:   09/25/21 1426 09/25/21 2145 09/26/21 0525 09/26/21 1303  BP: 134/70 (!) 141/88 138/68 130/83  Pulse: 73 69  66 77  Resp: '18 18 16 16  '$ Temp: 98.3 F (36.8 C) 98.2 F (36.8 C) 97.8 F (36.6 C) 98.5 F (36.9 C)  TempSrc: Oral Oral Oral Oral  SpO2: 94% 94% 93% 96%  Weight:      Height:        Intake/Output Summary (Last 24 hours) at 09/26/2021 1516 Last data filed at 09/26/2021 1100 Gross per 24 hour  Intake 713.61 ml  Output --  Net 713.61 ml    Filed Weights   09/23/21 1728  Weight: 98.5 kg    Examination:  General exam: Appears calm and comfortable  Respiratory system: Clear to auscultation. Respiratory effort normal.  Right breast covered with a dressing Cardiovascular system: S1 & S2 heard, RRR. No JVD, murmurs, rubs, gallops or clicks. No pedal edema. Gastrointestinal system: Abdomen is nondistended, soft and nontender. No organomegaly or masses felt. Normal bowel sounds heard. Central nervous system: Alert and oriented. No focal neurological deficits. Extremities: Symmetric 5 x 5 power. Skin: No rashes, lesions or ulcers Psychiatry: Judgement and insight appear normal. Mood & affect appropriate.     Data Reviewed: I have personally reviewed following labs and imaging studies  CBC: Recent Labs  Lab 09/23/21 1735 09/24/21 0543 09/25/21 0542  WBC 8.3 11.9* 9.0  NEUTROABS 4.9  --  5.9  HGB 12.4 12.4 12.7  HCT 38.7 39.4 39.2  MCV 91.5 93.6 91.4  PLT 390 377 097    Basic Metabolic Panel: Recent Labs  Lab 09/23/21 1735 09/24/21 0543 09/25/21 0542 09/26/21 0539  NA 138 135 137  --   K 4.2 3.7 3.8  --  CL 102 101 102  --   CO2 '28 23 26  '$ --   GLUCOSE 103* 153* 137*  --   BUN '17 17 12  '$ --   CREATININE 1.04* 0.99 0.95 0.97  CALCIUM 9.6 9.1 9.6  --     GFR: Estimated Creatinine Clearance: 69.8 mL/min (by C-G formula based on SCr of 0.97 mg/dL). Liver Function Tests: Recent Labs  Lab 09/23/21 1735 09/24/21 0543 09/25/21 0542  AST '19 18 18  '$ ALT 53* 44 35  ALKPHOS 107 104 100  BILITOT 0.4 0.5 0.7  PROT 7.3 6.9 6.8  ALBUMIN 3.8 3.5 3.5    No results  for input(s): LIPASE, AMYLASE in the last 168 hours. No results for input(s): AMMONIA in the last 168 hours. Coagulation Profile: No results for input(s): INR, PROTIME in the last 168 hours. Cardiac Enzymes: No results for input(s): CKTOTAL, CKMB, CKMBINDEX, TROPONINI in the last 168 hours. BNP (last 3 results) No results for input(s): PROBNP in the last 8760 hours. HbA1C: No results for input(s): HGBA1C in the last 72 hours. CBG: No results for input(s): GLUCAP in the last 168 hours. Lipid Profile: No results for input(s): CHOL, HDL, LDLCALC, TRIG, CHOLHDL, LDLDIRECT in the last 72 hours. Thyroid Function Tests: No results for input(s): TSH, T4TOTAL, FREET4, T3FREE, THYROIDAB in the last 72 hours. Anemia Panel: No results for input(s): VITAMINB12, FOLATE, FERRITIN, TIBC, IRON, RETICCTPCT in the last 72 hours. Sepsis Labs: No results for input(s): PROCALCITON, LATICACIDVEN in the last 168 hours.  Recent Results (from the past 240 hour(s))  Culture, blood (Routine X 2) w Reflex to ID Panel     Status: None (Preliminary result)   Collection Time: 09/23/21  6:17 PM   Specimen: BLOOD  Result Value Ref Range Status   Specimen Description   Final    BLOOD LEFT ANTECUBITAL Performed at [facility] [address], [place], [place] [address]    Special Requests   Final    BOTTLES DRAWN AEROBIC ONLY Blood Culture adequate volume Performed at [facility] [address] [place], [place] [address]    Culture   Final    NO GROWTH 3 DAYS Performed at [facility], [place] [address] [place], [place] [address]    Report Status PENDING  Incomplete  Culture, blood (Routine X 2) w Reflex to ID Panel     Status: None (Preliminary result)   Collection Time: 09/23/21  6:22 PM   Specimen: BLOOD  Result Value Ref Range Status   Specimen Description   Final    BLOOD BLOOD LEFT HAND Performed at [facility] [address]  [place], [place] [address]    Special Requests   Final    IN PEDIATRIC BOTTLE Blood Culture adequate volume Performed at [facility] [address], [place], [place] [address]    Culture   Final    NO GROWTH 3 DAYS Performed at [facility], [place] [address], [place], [place] [address]    Report Status PENDING  Incomplete          Radiology Studies: No results found.      Scheduled Meds:  FLUoxetine  30 mg Oral Daily   hydrochlorothiazide  25 mg Oral Daily   metoprolol succinate  25 mg Oral Daily   pantoprazole  40 mg Oral Daily   Continuous Infusions:  cefTRIAXone (ROCEPHIN)  IV 1 g (09/25/21 1810)   vancomycin 1,250 mg (09/25/21  1919)     LOS: 3 days    Time spent: 39 min    Georgette Shell, MD  09/26/2021, 3:16 PM

## 2021-09-26 NOTE — Progress Notes (Signed)
Pharmacy Antibiotic Note  [name] is a 61 y.o. female admitted on 09/23/2021 with  breast wound infection .  Pharmacy has been consulted for Vancomycin dosing.  Patient is also on ceftriaxone 1 g IV every 24 hours per provider x 7 days.  Active Problem(s): Breast wound. Continued breast pain from I&D of an abscess on 09/08/2021. She reported continue draining, fever and nausea. She apparently has been on rounds of doxycycline and bactrim.   PMH: right breast cancer, HTN, GERD, depression, anxiety. ACD, arthritis, chronic back pain, fibromyalgia, HLD, insomnia, Paraesophageal hernia  ID: Nonhealing breast wound (s/p doxy and Bactrim).  + h/o MRSA in 2020. - WBC WNL, Afebrile, Scr 0.97 (stable)  Vancomycin 5/15.>  Plan: Continue Vancomycin 1250 mg IV Q 24 hrs (Goal AUC 400-550, Expected AUC: 515, SCr used: 1) Monitor clinical progress, renal function, vancomycin levels if indicated F/U C&S, abx deescalation / LOT     Height: '5\' 3"'$  (160 cm) Weight: 98.5 kg (217 lb 2.5 oz) IBW/kg (Calculated) : 52.4  Temp (24hrs), Avg:98.1 F (36.7 C), Min:97.8 F (36.6 C), Max:98.3 F (36.8 C)  Recent Labs  Lab 09/23/21 1735 09/24/21 0543 09/25/21 0542 09/26/21 0539  WBC 8.3 11.9* 9.0  --   CREATININE 1.04* 0.99 0.95 0.97     Estimated Creatinine Clearance: 69.8 mL/min (by C-G formula based on SCr of 0.97 mg/dL).    Allergies  Allergen Reactions   Tramadol Itching    "bugs crawling all over"    Royetta Asal, PharmD, [place] Please utilize Amion for appropriate phone number to reach the unit pharmacist ([place]) 09/26/2021 8:26 AM

## 2021-09-26 NOTE — Plan of Care (Signed)

## 2021-09-26 NOTE — Care Management Important Message (Signed)
Important Message  Patient Details IM Letter given to the Patient. Name: [name] MRN: [id number] Date of Birth: 1960/06/09   Medicare Important Message Given:  Yes     Kerin Salen 09/26/2021, 10:01 AM

## 2021-09-27 ENCOUNTER — Other Ambulatory Visit (HOSPITAL_COMMUNITY): Payer: Self-pay

## 2021-09-27 DIAGNOSIS — N61 Mastitis without abscess: Secondary | ICD-10-CM | POA: Diagnosis not present

## 2021-09-27 LAB — CBC
HCT: 36.9 % (ref 36.0–46.0)
Hemoglobin: 12.3 g/dL (ref 12.0–15.0)
MCH: 30.4 pg (ref 26.0–34.0)
MCHC: 33.3 g/dL (ref 30.0–36.0)
MCV: 91.3 fL (ref 80.0–100.0)
Platelets: 315 10*3/uL (ref 150–400)
RBC: 4.04 MIL/uL (ref 3.87–5.11)
RDW: 15 % (ref 11.5–15.5)
WBC: 7 10*3/uL (ref 4.0–10.5)
nRBC: 0 % (ref 0.0–0.2)

## 2021-09-27 LAB — COMPREHENSIVE METABOLIC PANEL
ALT: 22 U/L (ref 0–44)
AST: 15 U/L (ref 15–41)
Albumin: 3.2 g/dL — ABNORMAL LOW (ref 3.5–5.0)
Alkaline Phosphatase: 79 U/L (ref 38–126)
Anion gap: 8 (ref 5–15)
BUN: 17 mg/dL (ref 6–20)
CO2: 25 mmol/L (ref 22–32)
Calcium: 8.9 mg/dL (ref 8.9–10.3)
Chloride: 100 mmol/L (ref 98–111)
Creatinine, Ser: 1.05 mg/dL — ABNORMAL HIGH (ref 0.44–1.00)
GFR, Estimated: >60 mL/min (ref >60)
Glucose, Bld: 112 mg/dL — ABNORMAL HIGH (ref 70–99)
Potassium: 3.1 mmol/L — ABNORMAL LOW (ref 3.5–5.1)
Sodium: 133 mmol/L — ABNORMAL LOW (ref 135–145)
Total Bilirubin: 0.5 mg/dL (ref 0.3–1.2)
Total Protein: 6.4 g/dL — ABNORMAL LOW (ref 6.5–8.1)

## 2021-09-27 MED ORDER — LINEZOLID 600 MG PO TABS
600.0000 mg | ORAL_TABLET | Freq: Two times a day (BID) | ORAL | 0 refills | Status: DC
  Filled 2021-09-27: qty 10, 5d supply, fill #0

## 2021-09-27 MED ORDER — POTASSIUM CHLORIDE CRYS ER 20 MEQ PO TBCR
40.0000 meq | EXTENDED_RELEASE_TABLET | Freq: Once | ORAL | Status: AC
  Administered 2021-09-27: 40 meq via ORAL
  Filled 2021-09-27: qty 2

## 2021-09-27 MED ORDER — ACETAMINOPHEN 325 MG PO TABS: 650.0000 mg | ORAL_TABLET | Freq: Four times a day (QID) | ORAL | Status: DC | PRN

## 2021-09-27 MED ORDER — LINEZOLID 600 MG PO TABS: 600.0000 mg | ORAL_TABLET | Freq: Two times a day (BID) | ORAL | 0 refills | Status: DC

## 2021-09-27 NOTE — Discharge Summary (Signed)
Physician Discharge Summary  [name] NLG:[id number] DOB: 23-Jan-1961 DOA: 09/23/2021  PCP: Lorrene Reid, PA-C  Admit date: 09/23/2021 Discharge date: 09/27/2021  Admitted From: Home Disposition: Home  Recommendations for Outpatient Follow-up:  Follow up with PCP in 1-2 weeks Please obtain BMP/CBC in one week Please follow up with general surgery and oncology and PCP  Home Health: None Equipment/Devices: None  Discharge Condition: Stable CODE STATUS: Full code Diet recommendation: Cardiac Brief/Interim Summary: 61 year old female admitted with right breast abscess.  She is admitted with purulent discharge despite being on doxycycline and Bactrim as an outpatient.  She has history of right breast cancer status postlumpectomy and XRT. Infected seroma 09/08/2021 seen in ED by Dr.tsuie underwent excision and evacuation with Penrose drain in place and patient was discharged  Discharge Diagnoses:  Principal Problem:   Breast infection Active Problems:   GERD (gastroesophageal reflux disease)   Essential hypertension   Anxiety   Malignant neoplasm of upper-outer quadrant of right breast in female, estrogen receptor positive ([place])     #1 right breast abscess status post recent seroma evacuation 09/08/2021.  Patient was treated with vancomycin and Rocephin.  Blood cultures no growth to date.  General surgery was consulted and was following.  Discussed with infectious disease prior to discharge plan to discharge her on Zyvox 600 mg twice a day for 5 more days to finish a course of 10-day treatment.  #2 hypertension blood pressure 134/70 On HCTZ and metoprolol   #3 depression on Prozac   Estimated body mass index is 38.47 kg/m as calculated from the following:   Height as of this encounter: '5\' 3"'$  (1.6 m).   Weight as of this encounter: 98.5 kg.  Discharge Instructions  Discharge Instructions     Diet - low sodium heart healthy   Complete by: As directed    Discharge wound  care:   Complete by: As directed    See orders   Increase activity slowly   Complete by: As directed       Allergies as of 09/27/2021       Reactions   Tramadol Itching   "bugs crawling all over"        Medication List     STOP taking these medications    OXYGEN   sucralfate 1 GM/10ML suspension Commonly known as: Carafate   sulfamethoxazole-trimethoprim 800-160 MG tablet Commonly known as: BACTRIM DS       TAKE these medications    acetaminophen 325 MG tablet Commonly known as: TYLENOL Take 2 tablets (650 mg total) by mouth every 6 (six) hours as needed for mild pain (or Fever >/= 101).   FLUoxetine 20 MG tablet Commonly known as: PROZAC Take 1.5 tablets (30 mg total) by mouth daily. Take 1.5 tablet by mouth daily.   hydrochlorothiazide 50 MG tablet Commonly known as: HYDRODIURIL Take 1 tablet (50 mg total) by mouth daily.   letrozole 2.5 MG tablet Commonly known as: FEMARA Take 1 tablet (2.5 mg total) by mouth daily.   linezolid 600 MG tablet Commonly known as: Zyvox Take 1 tablet (600 mg total) by mouth 2 (two) times daily.   lisinopril 40 MG tablet Commonly known as: ZESTRIL Take 1 tablet (40 mg total) by mouth daily.   metoprolol succinate 25 MG 24 hr tablet Commonly known as: TOPROL-XL Take 1 tablet (25 mg total) by mouth daily. **PLEASE CONTACT OUR OFFICE TO SCHEDULE A FOLLOW UP FOR FUTURE MED REFILLS**   ondansetron 4 MG disintegrating tablet Commonly  known as: ZOFRAN-ODT Take 1 tablet (4 mg total) by mouth 2 (two) times daily as needed for nausea or vomiting.   oxyCODONE 5 MG immediate release tablet Commonly known as: Oxy IR/ROXICODONE Take 1 tablet (5 mg total) by mouth every 6 (six) hours as needed for severe pain.               Discharge Care Instructions  (From admission, onward)           Start     Ordered   09/27/21 0000  Discharge wound care:       Comments: See orders   09/27/21 1458            Follow-up  Information     Lorrene Reid, PA-C Follow up.   Specialty: Physician Assistant Contact information: [place] [address] [phone]         Jovita Kussmaul, MD Follow up.   Specialty: General Surgery Contact information: [address] [place]  [address] [phone]                Allergies  Allergen Reactions   Tramadol Itching    "bugs crawling all over"    Consultations: General surgery   Procedures/Studies: DG Chest 1 View  Result Date: 09/08/2021 CLINICAL DATA:  Painful mass RIGHT breast, lumpectomy in January 2023, developed fever and chills and start antibiotics on Tuesday EXAM: CHEST  1 VIEW COMPARISON:  Portable exam 1502 hours compared to 02/07/2021 FINDINGS: Minimal enlargement of cardiac silhouette. Mediastinal contours and pulmonary vascularity normal. Lungs clear. No infiltrate, pleural effusion, or pneumothorax. Surgical clips in RIGHT axilla. IMPRESSION: No acute abnormalities. Electronically Signed   By: Lavonia Dana M.D.   On: 09/08/2021 15:30   CT CHEST WO CONTRAST  Result Date: 09/24/2021 CLINICAL DATA:  Breast abscess EXAM: CT CHEST WITHOUT CONTRAST TECHNIQUE: Multidetector CT imaging of the chest was performed following the standard protocol without IV contrast. RADIATION DOSE REDUCTION: This exam was performed according to the departmental dose-optimization program which includes automated exposure control, adjustment of the mA and/or kV according to patient size and/or use of iterative reconstruction technique. COMPARISON:  09/08/2021 FINDINGS: Cardiovascular: Heart size normal. Trace pericardial fluid. Aortic Atherosclerosis (ICD10-170.0). Mediastinum/Nodes: Small hiatal hernia.  No mass or adenopathy. Lungs/Pleura: No pleural effusion. No pneumothorax. 7 mm anterior right upper lobe nodule (Im54,Se5) , stable since 02/28/2019. Linear scarring or atelectasis in the medial basal segment left lower lobe.  lungs otherwise clear. Upper Abdomen: 3.6 cm 7 HU right adrenal nodule likely adenoma, stable since 02/28/2019. partially calcified gallstones measuring up to 2.7 cm in the nondilated gallbladder. No acute findings. Musculoskeletal: Interval placement of drain catheter into the previously noted right breast collection, with evacuation of the fluid component. No residual or undrained fluid component identified. Postop changes in the right breast with skin thickening. Regional bones unremarkable. IMPRESSION: 1. Interval evacuation of right breast abscess by percutaneous drain catheter. 2. 7 mm right solid pulmonary nodule within the upper lobe, stable since 2020. If patient is high risk for malignancy, recommend an additional non-contrast Chest CT at 18-24 months; if patient is low risk for malignancy a non-contrast Chest CT at 18-24 months is optional. These guidelines do not apply to immunocompromised patients and patients with cancer. Follow up in patients with significant comorbidities as clinically warranted. For lung cancer screening, adhere to Lung-RADS guidelines. Reference: Radiology. 2017; 284(1):228-43. 3. Cholelithiasis Electronically Signed   By: Lucrezia Europe  M.D.   On: 09/24/2021 14:35   CT Chest W Contrast  Result Date: 09/08/2021 CLINICAL DATA:  Painful right breast mass. Status post lumpectomy in January of 2023. EXAM: CT CHEST WITH CONTRAST TECHNIQUE: Multidetector CT imaging of the chest was performed during intravenous contrast administration. RADIATION DOSE REDUCTION: This exam was performed according to the departmental dose-optimization program which includes automated exposure control, adjustment of the mA and/or kV according to patient size and/or use of iterative reconstruction technique. CONTRAST:  11m OMNIPAQUE IOHEXOL 300 MG/ML  SOLN COMPARISON:  12/10/2020 FINDINGS: Cardiovascular: Normal heart size. No pericardial effusion. Aortic atherosclerotic calcifications. Mediastinum/Nodes: No  enlarged mediastinal, hilar, or axillary lymph nodes. Status post right axillary node dissection. Thyroid gland, trachea, and esophagus demonstrate no significant findings. Lungs/Pleura: No pleural effusion, airspace consolidation, or atelectasis. Within the anteromedial right upper lobe there is a nodule measuring 6 mm, image 63/7. Unchanged from previous exam. Scar identified within the inferior lingula. Unchanged from prior study. Stable perifissural nodule along the minor fissure of the right lung measuring 3 mm. This likely represents a benign intrapulmonary lymph node, image 75/7. Upper Abdomen: No acute abnormality identified within the imaged portions of the aperture abdomen. Moderate hiatal hernia identified. Right adrenal gland nodule measures 3.2 by 2.5 cm. This is unchanged when compared with 02/28/2019 compatible with a benign adenoma. No follow-up recommended.Calcified gallstones are identified measuring up to 2.1 cm. Musculoskeletal: Postoperative change within the right breast compatible with previous lumpectomy. Peripherally enhancing fluid attenuating mass within the right breast measures 7.9 x 5.5 cm, image 62/2. There is surrounding fat stranding. Skin thickening overlies the right breast. IMPRESSION: 1. Status post right breast lumpectomy and right axillary node dissection. 2. Peripherally enhancing fluid attenuating mass within the right breast is identified with surrounding fat stranding and overlying skin thickening. Findings consistent with postoperative fluid collection. Underlying infection cannot be excluded. If symptomatic, [facility]. 3. Stable right adrenal gland adenoma. 4. Stable 6 mm right upper lobe lung nodule. 5. Hiatal hernia. 6. Gallstones. 7. Aortic Atherosclerosis (ICD10-I70.0). Electronically Signed   By: TKerby MoorsM.D.   On: 09/08/2021 17:49   (Echo, Carotid, EGD, Colonoscopy, ERCP)     Subjective:  Patient is resting in bed no complaints no diarrhea Discharge Exam: Vitals:   09/27/21 1254 09/27/21 1346  BP: 122/74 128/79  Pulse: 76 76  Resp: 15 16  Temp: 98.3 F (36.8 C) 98.1 F (36.7 C)  SpO2: 96% 97%   Vitals:   09/26/21 2128 09/27/21 0506 09/27/21 1254 09/27/21 1346  BP: 140/81 (!) 142/72 122/74 128/79  Pulse: 75 62 76 76  Resp: '14 18 15 16  '$ Temp: 98 F (36.7 C) 97.8 F (36.6 C) 98.3 F (36.8 C) 98.1 F (36.7 C)  TempSrc: Oral Oral Oral Oral  SpO2: 99% 95% 96% 97%  Weight:      Height:        General: Pt is alert, awake, not in acute distress Cardiovascular: RRR, S1/S2 +, no rubs, no gallops Respiratory: CTA bilaterally, no wheezing, no rhonchi dressing noted on the right breast Abdominal: Soft, NT, ND, bowel sounds + Extremities: no edema, no cyanosis    The results of significant diagnostics from this hospitalization (including imaging, microbiology, ancillary and laboratory) are listed below for reference.     Microbiology: Recent Results (from the past 240 hour(s))  Culture, blood (Routine X 2) w Reflex to ID Panel     Status:  None (Preliminary result)   Collection Time: 09/23/21  6:17 PM   Specimen: BLOOD  Result Value Ref Range Status   Specimen Description   Final    BLOOD LEFT ANTECUBITAL Performed at [facility] [address] [place], [place] [address]    Special Requests   Final    BOTTLES DRAWN AEROBIC ONLY Blood Culture adequate volume Performed at [facility] [address] [place], [place] [address]    Culture   Final    NO GROWTH 4 DAYS Performed at [facility], [place] [address], [place], [place] [address]    Report Status PENDING  Incomplete  Culture, blood (Routine X 2) w Reflex to ID Panel     Status: None (Preliminary result)   Collection Time: 09/23/21  6:22 PM   Specimen: BLOOD  Result Value Ref Range Status   Specimen Description   Final    BLOOD BLOOD  LEFT HAND Performed at [facility] [address] [place], [place] [address]    Special Requests   Final    IN PEDIATRIC BOTTLE Blood Culture adequate volume Performed at [facility] [address] [place], [place] [address]    Culture   Final    NO GROWTH 4 DAYS Performed at [facility], [place] [address] [place], [place] [address]    Report Status PENDING  Incomplete     Labs: BNP (last 3 results) No results for input(s): BNP in the last 8760 hours. Basic Metabolic Panel: Recent Labs  Lab 09/23/21 1735 09/24/21 0543 09/25/21 0542 09/26/21 0539 09/27/21 0710  NA 138 135 137  --  133*  K 4.2 3.7 3.8  --  3.1*  CL 102 101 102  --  100  CO2 '28 23 26  '$ --  25  GLUCOSE 103* 153* 137*  --  112*  BUN '17 17 12  '$ --  17  CREATININE 1.04* 0.99 0.95 0.97 1.05*  CALCIUM 9.6 9.1 9.6  --  8.9   Liver Function Tests: Recent Labs  Lab 09/23/21 1735 09/24/21 0543 09/25/21 0542 09/27/21 0710  AST '19 18 18 15  '$ ALT 53* 44 35 22  ALKPHOS 107 104 100 79  BILITOT 0.4 0.5 0.7 0.5  PROT 7.3 6.9 6.8 6.4*  ALBUMIN 3.8 3.5 3.5 3.2*   No results for input(s): LIPASE, AMYLASE in the last 168 hours. No results for input(s): AMMONIA in the last 168 hours. CBC: Recent Labs  Lab 09/23/21 1735 09/24/21 0543 09/25/21 0542 09/27/21 0710  WBC 8.3 11.9* 9.0 7.0  NEUTROABS 4.9  --  5.9  --   HGB 12.4 12.4 12.7 12.3  HCT 38.7 39.4 39.2 36.9  MCV 91.5 93.6 91.4 91.3  PLT 390 377 367 315   Cardiac Enzymes: No results for input(s): CKTOTAL, CKMB, CKMBINDEX, TROPONINI in the last 168 hours. BNP: Invalid input(s): POCBNP CBG: No results for input(s): GLUCAP in the last 168 hours. D-Dimer No results for input(s): DDIMER in the last 72 hours. Hgb A1c No results for input(s): HGBA1C in the last 72 hours. Lipid Profile No results for input(s): CHOL, HDL, LDLCALC, TRIG, CHOLHDL, LDLDIRECT in the last 72 hours. Thyroid function studies No  results for input(s): TSH, T4TOTAL, T3FREE, THYROIDAB in the last 72 hours.  Invalid input(s): FREET3 Anemia work up No results for input(s): VITAMINB12, FOLATE, FERRITIN, TIBC, IRON, RETICCTPCT in the last 72 hours. Urinalysis    Component Value Date/Time  COLORURINE YELLOW 12/14/2020 [place] 12/14/2020 1034   LABSPEC 1.015 12/14/2020 1034   PHURINE 6.0 12/14/2020 1034   GLUCOSEU NEGATIVE 12/14/2020 1034   HGBUR NEGATIVE 12/14/2020 1034   [place] 12/14/2020 1034   BILIRUBINUR negative 04/23/2016 [place] 12/14/2020 1034   PROTEINUR NEGATIVE 12/14/2020 1034   UROBILINOGEN 0.2 04/23/2016 1415   UROBILINOGEN 0.2 09/07/2014 1503   NITRITE NEGATIVE 12/14/2020 1034   LEUKOCYTESUR SMALL (A) 12/14/2020 1034   Sepsis Labs Invalid input(s): PROCALCITONIN,  WBC,  LACTICIDVEN Microbiology Recent Results (from the past 240 hour(s))  Culture, blood (Routine X 2) w Reflex to ID Panel     Status: None (Preliminary result)   Collection Time: 09/23/21  6:17 PM   Specimen: BLOOD  Result Value Ref Range Status   Specimen Description   Final    BLOOD LEFT ANTECUBITAL Performed at [facility], [place] [address] [place], [place] [address]    Special Requests   Final    BOTTLES DRAWN AEROBIC ONLY Blood Culture adequate volume Performed at [facility] [address], [place], [place] [address]    Culture   Final    NO GROWTH 4 DAYS Performed at [facility], [place] [address], [place], [place] [address]    Report Status PENDING  Incomplete  Culture, blood (Routine X 2) w Reflex to ID Panel     Status: None (Preliminary result)   Collection Time: 09/23/21  6:22 PM   Specimen: BLOOD  Result Value Ref Range Status   Specimen Description   Final    BLOOD BLOOD LEFT HAND Performed at [facility] [address], [place], [place] [address]    Special Requests   Final    IN PEDIATRIC  BOTTLE Blood Culture adequate volume Performed at [facility] [address] [place], [place] [address]    Culture   Final    NO GROWTH 4 DAYS Performed at [facility], [place] [address], [place], [place] [address]    Report Status PENDING  Incomplete     Time coordinating discharge: 39 minutes  SIGNED:   Georgette Shell, MD  [facility] 09/27/2021, 3:56 PM

## 2021-09-27 NOTE — TOC Benefit Eligibility Note (Signed)
Patient Advocate Encounter   Received notification that prior authorization for Linezolid '600MG'$  tablets is required.   PA submitted on 09/27/2021 Key [id number] Status is pending

## 2021-09-27 NOTE — TOC Benefit Eligibility Note (Signed)
Patient Advocate Encounter  Prior Authorization for Linezolid '600MG'$  tablets has been approved.    PA# [id number] Effective dates: 06/29/2021 through 12/28/2021  Patients co-pay is $0.28.

## 2021-09-27 NOTE — Discharge Instructions (Addendum)
Wash right breast with normal saline pat to dry apply dry gauze around the Penrose drain and cover with foam dressing

## 2021-09-27 NOTE — TOC Benefit Eligibility Note (Signed)
Patient Research scientist (life sciences) completed.     The patient is currently admitted and upon discharge could be taking Linezolid '600MG'$  tablets.   The current 30 day co-pay is requiring a prior authorization  The patient is insured through [facility].

## 2021-09-27 NOTE — Plan of Care (Signed)

## 2021-09-28 LAB — CULTURE, BLOOD (ROUTINE X 2)
Culture: NO GROWTH
Culture: NO GROWTH
Special Requests: ADEQUATE
Special Requests: ADEQUATE

## 2021-10-02 ENCOUNTER — Ambulatory Visit: Payer: Medicare Other | Admitting: Physician Assistant

## 2021-10-02 ENCOUNTER — Encounter: Payer: Self-pay | Admitting: Physician Assistant

## 2021-10-02 ENCOUNTER — Ambulatory Visit (INDEPENDENT_AMBULATORY_CARE_PROVIDER_SITE_OTHER): Payer: Medicare Other | Admitting: Physician Assistant

## 2021-10-02 VITALS — BP 112/74 | HR 82 | Temp 97.7°F | Ht 63.5 in | Wt 219.0 lb

## 2021-10-02 DIAGNOSIS — I10 Essential (primary) hypertension: Secondary | ICD-10-CM | POA: Diagnosis not present

## 2021-10-02 DIAGNOSIS — F32A Depression, unspecified: Secondary | ICD-10-CM | POA: Diagnosis not present

## 2021-10-02 DIAGNOSIS — R319 Hematuria, unspecified: Secondary | ICD-10-CM

## 2021-10-02 DIAGNOSIS — F419 Anxiety disorder, unspecified: Secondary | ICD-10-CM | POA: Diagnosis not present

## 2021-10-02 LAB — POCT URINALYSIS DIPSTICK
Bilirubin, UA: NEGATIVE
Glucose, UA: NEGATIVE
Ketones, UA: NEGATIVE
Nitrite, UA: NEGATIVE
Protein, UA: POSITIVE — AB
Spec Grav, UA: 1.02 (ref 1.010–1.025)
Urobilinogen, UA: 0.2 E.U./dL
pH, UA: 7 (ref 5.0–8.0)

## 2021-10-02 MED ORDER — METOPROLOL SUCCINATE ER 25 MG PO TB24: 25.0000 mg | ORAL_TABLET | Freq: Every day | ORAL | 0 refills | Status: DC

## 2021-10-02 MED ORDER — HYDROCHLOROTHIAZIDE 50 MG PO TABS: 50.0000 mg | ORAL_TABLET | Freq: Every day | ORAL | 0 refills | Status: DC

## 2021-10-02 MED ORDER — FLUOXETINE HCL 40 MG PO CAPS: 40.0000 mg | ORAL_CAPSULE | Freq: Every day | ORAL | 2 refills | Status: DC

## 2021-10-02 MED ORDER — CEFTRIAXONE SODIUM 1 G IJ SOLR
1.0000 g | Freq: Once | INTRAMUSCULAR | Status: AC
  Administered 2021-10-02: 1 g via INTRAMUSCULAR

## 2021-10-02 MED ORDER — LISINOPRIL 40 MG PO TABS: 40.0000 mg | ORAL_TABLET | Freq: Every day | ORAL | 0 refills | Status: DC

## 2021-10-02 NOTE — Progress Notes (Addendum)
Established patient acute visit   Patient: [name]   DOB: [birth year]   61 y.o. Female  MRN: [id number] Visit Date: 10/02/2021  Chief Complaint  Patient presents with   Hematuria   Subjective    HPI  Patient presents with c/o lower abdominal pain, blood in the urine and when wiping, and urinary frequency. Denies burning with urination. Symptoms started when she was in the hospital last week. States had low back pain when in the hospital last week. Patient reports there are some days where she continues to feel down and does not have motivation to do things. Taking Prozac 30 mg without issues.     Medications: Outpatient Medications Prior to Visit  Medication Sig Note   acetaminophen (TYLENOL) 325 MG tablet Take 2 tablets (650 mg total) by mouth every 6 (six) hours as needed for mild pain (or Fever >/= 101).    letrozole (FEMARA) 2.5 MG tablet Take 1 tablet (2.5 mg total) by mouth daily.    ondansetron (ZOFRAN-ODT) 4 MG disintegrating tablet Take 1 tablet (4 mg total) by mouth 2 (two) times daily as needed for nausea or vomiting.    oxyCODONE (OXY IR/ROXICODONE) 5 MG immediate release tablet Take 1 tablet (5 mg total) by mouth every 6 (six) hours as needed for severe pain.    [DISCONTINUED] FLUoxetine (PROZAC) 20 MG tablet Take 1.5 tablets (30 mg total) by mouth daily. Take 1.5 tablet by mouth daily.    [DISCONTINUED] hydrochlorothiazide (HYDRODIURIL) 50 MG tablet Take 1 tablet (50 mg total) by mouth daily.    [DISCONTINUED] linezolid (ZYVOX) 600 MG tablet Take 1 tablet (600 mg total) by mouth 2 (two) times daily. 10/02/2021: med interaction with Prozac   [DISCONTINUED] lisinopril (ZESTRIL) 40 MG tablet Take 1 tablet (40 mg total) by mouth daily.    [DISCONTINUED] metoprolol succinate (TOPROL-XL) 25 MG 24 hr tablet Take 1 tablet (25 mg total) by mouth daily. **PLEASE CONTACT OUR OFFICE TO SCHEDULE A FOLLOW UP FOR FUTURE MED REFILLS**    No facility-administered medications prior to  visit.    Review of Systems Review of Systems:  A fourteen system review of systems was performed and found to be positive as per HPI.  Last CBC Lab Results  Component Value Date   WBC 7.0 09/27/2021   HGB 12.3 09/27/2021   HCT 36.9 09/27/2021   MCV 91.3 09/27/2021   MCH 30.4 09/27/2021   RDW 15.0 09/27/2021   PLT 315 09/32/6712   Last metabolic panel Lab Results  Component Value Date   GLUCOSE 112 (H) 09/27/2021   NA 133 (L) 09/27/2021   K 3.1 (L) 09/27/2021   CL 100 09/27/2021   CO2 25 09/27/2021   BUN 17 09/27/2021   CREATININE 1.05 (H) 09/27/2021   GFRNONAA >60 09/27/2021   CALCIUM 8.9 09/27/2021   PROT 6.4 (L) 09/27/2021   ALBUMIN 3.2 (L) 09/27/2021   LABGLOB 2.5 06/04/2020   AGRATIO 1.6 06/04/2020   BILITOT 0.5 09/27/2021   ALKPHOS 79 09/27/2021   AST 15 09/27/2021   ALT 22 09/27/2021   ANIONGAP 8 09/27/2021   Last lipids Lab Results  Component Value Date   CHOL 236 (H) 06/29/2018   HDL 57 06/29/2018   LDLCALC 155 (H) 06/29/2018   TRIG 122 06/29/2018   CHOLHDL 4.1 06/29/2018   Last hemoglobin A1c Lab Results  Component Value Date   HGBA1C 5.8 (H) 08/22/2019   Last thyroid functions Lab Results  Component Value Date   TSH 1.720  08/22/2019   Last vitamin D Lab Results  Component Value Date   VD25OH 18.4 (L) 08/22/2019     Objective    BP 112/74   Pulse 82   Temp 97.7 F (36.5 C)   Ht 5' 3.5" (1.613 m)   Wt 219 lb (99.3 kg)   LMP 02/07/2013   SpO2 98%   BMI 38.19 kg/m    Physical Exam  General:  Well Developed, well nourished, appropriate for stated age.  Neuro:  Alert and oriented,  extra-ocular muscles intact  HEENT:  Normocephalic, atraumatic, neck supple  Skin:  no gross rash, warm, pink. Abdomen: suprapubic tenderness, non-distended, no CVA tenderness Cardiac:  RRR, S1 S2 Respiratory: CTA B/L  Vascular:  Ext warm, no cyanosis apprec.; cap RF less 2 sec. Psych:  No HI/SI, judgement and insight good, Euthymic mood. Full  Affect.   Results for orders placed or performed in visit on 10/02/21  POCT urinalysis dipstick  Result Value Ref Range   Color, UA     Clarity, UA     Glucose, UA Negative Negative   Bilirubin, UA neg    Ketones, UA neg    Spec Grav, UA 1.020 1.010 - 1.025   Blood, UA large    pH, UA 7.0 5.0 - 8.0   Protein, UA Positive (A) Negative   Urobilinogen, UA 0.2 0.2 or 1.0 E.U./dL   Nitrite, UA neg    Leukocytes, UA Trace (A) Negative   Appearance     Odor      Assessment & Plan     Patient presenting with urinary symptoms so UA collected which is positive for trace of leukocytes, large blood and protein. Will send for urine culture. Patient was prescribed Linezolid which is a medication interaction with Prozac so recommend to discontinue medication. Reviewed hospital notes and labs, blood culture negative. Will administer ceftriaxone 1 gram injection for empiric treatment of UTI and will also provide antibiotic coverage for breast infection. Recommend to follow-up with general surgery as scheduled next week. Patient deferred obtaining labs today so will collect next week at f/up visit. Will increase Prozac to 40 mg to help improve mood. Provided needed medication refills and recommend to follow-up next week for chronic conditions.    Return in about 1 week (around 10/09/2021) for Mood, HTN, UTI.        Lorrene Reid, PA-C  [facility] [phone] (phone) [phone] (fax)  [place]

## 2021-10-02 NOTE — Patient Instructions (Signed)

## 2021-10-04 LAB — URINE CULTURE

## 2021-10-21 ENCOUNTER — Telehealth: Payer: Self-pay | Admitting: *Deleted

## 2021-10-21 NOTE — Telephone Encounter (Signed)
Received call from pt with complaint of ongoing right breast infection and slow healing. Pt currently being treated by Dr. Marlou Starks and is packing the wound daily.  Pt states she is becoming depressed with the long process and is embarrassed to be around others due to a constant odor.  Pt states she does not f/u with Dr. Marlou Starks until next week and would like to be seen sooner.  MD out of office this week and RN encouraged pt to reach back out to Dr. Marlou Starks to be seen sooner.  RN also messaged Dr. Marlou Starks and his team regarding pt complaint and requested pt to be seen this week.

## 2021-10-23 ENCOUNTER — Encounter: Payer: Self-pay | Admitting: *Deleted

## 2021-10-23 NOTE — Progress Notes (Signed)
Received message from nurse with Dr. Marlou Starks stating she has reached out to pt and offered an office f/u to evaluate wound.  Stated pt politely declined at this time and would follow up next week with their office.

## 2021-10-24 ENCOUNTER — Telehealth: Payer: Self-pay | Admitting: Adult Health

## 2021-10-24 NOTE — Telephone Encounter (Signed)
Rescheduled appointment per provider PAL. Left message. 

## 2021-10-31 ENCOUNTER — Encounter (HOSPITAL_COMMUNITY): Payer: Self-pay | Admitting: Emergency Medicine

## 2021-10-31 ENCOUNTER — Emergency Department (HOSPITAL_COMMUNITY): Payer: Medicare Other

## 2021-10-31 ENCOUNTER — Telehealth: Payer: Self-pay

## 2021-10-31 ENCOUNTER — Observation Stay (HOSPITAL_COMMUNITY)
Admission: EM | Admit: 2021-10-31 | Discharge: 2021-11-02 | Disposition: A | Discharge status: Home / Self Care | Payer: Medicare Other | Attending: Internal Medicine | Admitting: Internal Medicine

## 2021-10-31 DIAGNOSIS — I2699 Other pulmonary embolism without acute cor pulmonale: Secondary | ICD-10-CM | POA: Diagnosis not present

## 2021-10-31 DIAGNOSIS — F32A Depression, unspecified: Secondary | ICD-10-CM | POA: Diagnosis not present

## 2021-10-31 DIAGNOSIS — L03313 Cellulitis of chest wall: Secondary | ICD-10-CM | POA: Insufficient documentation

## 2021-10-31 DIAGNOSIS — Z7901 Long term (current) use of anticoagulants: Secondary | ICD-10-CM | POA: Insufficient documentation

## 2021-10-31 DIAGNOSIS — R059 Cough, unspecified: Secondary | ICD-10-CM | POA: Insufficient documentation

## 2021-10-31 DIAGNOSIS — Z853 Personal history of malignant neoplasm of breast: Secondary | ICD-10-CM | POA: Insufficient documentation

## 2021-10-31 DIAGNOSIS — Z79899 Other long term (current) drug therapy: Secondary | ICD-10-CM | POA: Insufficient documentation

## 2021-10-31 DIAGNOSIS — Z79811 Long term (current) use of aromatase inhibitors: Secondary | ICD-10-CM | POA: Insufficient documentation

## 2021-10-31 DIAGNOSIS — E876 Hypokalemia: Secondary | ICD-10-CM | POA: Diagnosis not present

## 2021-10-31 DIAGNOSIS — N644 Mastodynia: Secondary | ICD-10-CM | POA: Diagnosis present

## 2021-10-31 DIAGNOSIS — I10 Essential (primary) hypertension: Secondary | ICD-10-CM | POA: Diagnosis not present

## 2021-10-31 DIAGNOSIS — N61 Mastitis without abscess: Principal | ICD-10-CM | POA: Diagnosis present

## 2021-10-31 DIAGNOSIS — F419 Anxiety disorder, unspecified: Secondary | ICD-10-CM | POA: Insufficient documentation

## 2021-10-31 LAB — CBC WITH DIFFERENTIAL/PLATELET
Abs Immature Granulocytes: 0.04 10*3/uL (ref 0.00–0.07)
Basophils Absolute: 0.1 10*3/uL (ref 0.0–0.1)
Basophils Relative: 1 %
Eosinophils Absolute: 0.1 10*3/uL (ref 0.0–0.5)
Eosinophils Relative: 1 %
HCT: 37.7 % (ref 36.0–46.0)
Hemoglobin: 12.3 g/dL (ref 12.0–15.0)
Immature Granulocytes: 0 %
Lymphocytes Relative: 19 %
Lymphs Abs: 1.9 10*3/uL (ref 0.7–4.0)
MCH: 29.8 pg (ref 26.0–34.0)
MCHC: 32.6 g/dL (ref 30.0–36.0)
MCV: 91.3 fL (ref 80.0–100.0)
Monocytes Absolute: 1 10*3/uL (ref 0.1–1.0)
Monocytes Relative: 9 %
Neutro Abs: 7 10*3/uL (ref 1.7–7.7)
Neutrophils Relative %: 70 %
Platelets: 332 10*3/uL (ref 150–400)
RBC: 4.13 MIL/uL (ref 3.87–5.11)
RDW: 14.5 % (ref 11.5–15.5)
WBC: 10.1 10*3/uL (ref 4.0–10.5)
nRBC: 0 % (ref 0.0–0.2)

## 2021-10-31 LAB — BASIC METABOLIC PANEL
Anion gap: 9 (ref 5–15)
BUN: 21 mg/dL — ABNORMAL HIGH (ref 6–20)
CO2: 25 mmol/L (ref 22–32)
Calcium: 9.1 mg/dL (ref 8.9–10.3)
Chloride: 102 mmol/L (ref 98–111)
Creatinine, Ser: 1.08 mg/dL — ABNORMAL HIGH (ref 0.44–1.00)
GFR, Estimated: 59 mL/min — ABNORMAL LOW (ref >60)
Glucose, Bld: 107 mg/dL — ABNORMAL HIGH (ref 70–99)
Potassium: 3.2 mmol/L — ABNORMAL LOW (ref 3.5–5.1)
Sodium: 136 mmol/L (ref 135–145)

## 2021-10-31 LAB — LACTIC ACID, PLASMA: Lactic Acid, Venous: 2.1 mmol/L (ref 0.5–1.9)

## 2021-10-31 MED ORDER — APIXABAN 5 MG PO TABS: 5.0000 mg | ORAL_TABLET | Freq: Two times a day (BID) | ORAL | Status: DC

## 2021-10-31 MED ORDER — DOXYCYCLINE HYCLATE 100 MG PO TABS
100.0000 mg | ORAL_TABLET | Freq: Once | ORAL | Status: AC
  Administered 2021-10-31: 100 mg via ORAL
  Filled 2021-10-31: qty 1

## 2021-10-31 MED ORDER — LACTATED RINGERS IV BOLUS
1000.0000 mL | Freq: Once | INTRAVENOUS | Status: AC
Start: 2021-10-31 — End: 2021-10-31
  Administered 2021-10-31: 1000 mL via INTRAVENOUS

## 2021-10-31 MED ORDER — LISINOPRIL 20 MG PO TABS
40.0000 mg | ORAL_TABLET | Freq: Every evening | ORAL | Status: DC
  Administered 2021-11-01: 40 mg via ORAL
  Filled 2021-10-31: qty 2

## 2021-10-31 MED ORDER — IPRATROPIUM-ALBUTEROL 0.5-2.5 (3) MG/3ML IN SOLN
3.0000 mL | Freq: Four times a day (QID) | RESPIRATORY_TRACT | Status: DC | PRN
  Administered 2021-11-02: 3 mL via RESPIRATORY_TRACT
  Filled 2021-10-31: qty 3

## 2021-10-31 MED ORDER — TRAZODONE HCL 50 MG PO TABS: 25.0000 mg | ORAL_TABLET | Freq: Every evening | ORAL | Status: DC | PRN

## 2021-10-31 MED ORDER — APIXABAN 5 MG PO TABS
10.0000 mg | ORAL_TABLET | Freq: Once | ORAL | Status: DC
Start: 2021-10-31 — End: 2021-10-31

## 2021-10-31 MED ORDER — ONDANSETRON HCL 4 MG PO TABS: 4.0000 mg | ORAL_TABLET | Freq: Four times a day (QID) | ORAL | Status: DC | PRN

## 2021-10-31 MED ORDER — ACETAMINOPHEN 650 MG RE SUPP: 650.0000 mg | Freq: Four times a day (QID) | RECTAL | Status: DC | PRN

## 2021-10-31 MED ORDER — APIXABAN (ELIQUIS) EDUCATION KIT FOR DVT/PE PATIENTS
PACK | Freq: Once | Status: DC
  Filled 2021-10-31: qty 1

## 2021-10-31 MED ORDER — POTASSIUM CHLORIDE CRYS ER 20 MEQ PO TBCR
40.0000 meq | EXTENDED_RELEASE_TABLET | Freq: Once | ORAL | Status: AC
  Administered 2021-10-31: 40 meq via ORAL
  Filled 2021-10-31: qty 2

## 2021-10-31 MED ORDER — ACETAMINOPHEN 325 MG PO TABS
650.0000 mg | ORAL_TABLET | Freq: Four times a day (QID) | ORAL | Status: DC | PRN
  Administered 2021-11-01: 650 mg via ORAL
  Filled 2021-10-31: qty 2

## 2021-10-31 MED ORDER — SODIUM CHLORIDE 0.9 % IV SOLN: INTRAVENOUS | Status: DC

## 2021-10-31 MED ORDER — LETROZOLE 2.5 MG PO TABS
2.5000 mg | ORAL_TABLET | Freq: Every evening | ORAL | Status: DC
  Administered 2021-11-01: 2.5 mg via ORAL
  Filled 2021-10-31 (×2): qty 1

## 2021-10-31 MED ORDER — IOHEXOL 300 MG/ML  SOLN
75.0000 mL | Freq: Once | INTRAMUSCULAR | Status: AC | PRN
  Administered 2021-10-31: 75 mL via INTRAVENOUS

## 2021-10-31 MED ORDER — APIXABAN 5 MG PO TABS
10.0000 mg | ORAL_TABLET | Freq: Two times a day (BID) | ORAL | Status: DC
  Administered 2021-10-31 – 2021-11-02 (×4): 10 mg via ORAL
  Filled 2021-10-31 (×4): qty 2

## 2021-10-31 MED ORDER — SENNOSIDES-DOCUSATE SODIUM 8.6-50 MG PO TABS: 1.0000 | ORAL_TABLET | Freq: Every evening | ORAL | Status: DC | PRN

## 2021-10-31 MED ORDER — METOPROLOL SUCCINATE ER 25 MG PO TB24
25.0000 mg | ORAL_TABLET | Freq: Every evening | ORAL | Status: DC
  Administered 2021-11-01: 25 mg via ORAL
  Filled 2021-10-31: qty 1

## 2021-10-31 MED ORDER — DOXYCYCLINE HYCLATE 100 MG PO TABS
100.0000 mg | ORAL_TABLET | Freq: Two times a day (BID) | ORAL | Status: DC
Start: 2021-11-01 — End: 2021-11-02
  Administered 2021-11-01 – 2021-11-02 (×3): 100 mg via ORAL
  Filled 2021-10-31 (×4): qty 1

## 2021-10-31 MED ORDER — IPRATROPIUM BROMIDE 0.02 % IN SOLN: 0.5000 mg | Freq: Four times a day (QID) | RESPIRATORY_TRACT | Status: DC | PRN

## 2021-10-31 MED ORDER — FLUOXETINE HCL 20 MG PO CAPS
40.0000 mg | ORAL_CAPSULE | Freq: Every evening | ORAL | Status: DC
  Administered 2021-11-01: 40 mg via ORAL
  Filled 2021-10-31: qty 2

## 2021-10-31 MED ORDER — ONDANSETRON HCL 4 MG/2ML IJ SOLN: 4.0000 mg | Freq: Four times a day (QID) | INTRAMUSCULAR | Status: DC | PRN

## 2021-10-31 MED ORDER — BISACODYL 5 MG PO TBEC: 5.0000 mg | DELAYED_RELEASE_TABLET | Freq: Every day | ORAL | Status: DC | PRN

## 2021-10-31 MED ORDER — ALBUTEROL SULFATE (2.5 MG/3ML) 0.083% IN NEBU: 2.5000 mg | INHALATION_SOLUTION | Freq: Four times a day (QID) | RESPIRATORY_TRACT | Status: DC | PRN

## 2021-10-31 MED ORDER — HYDROCHLOROTHIAZIDE 25 MG PO TABS
50.0000 mg | ORAL_TABLET | Freq: Every evening | ORAL | Status: DC
  Administered 2021-11-01: 50 mg via ORAL
  Filled 2021-10-31: qty 2

## 2021-10-31 NOTE — Telephone Encounter (Signed)
Return call to pt, pt requests to see Dr Lindi Adie today.  I advised her that we were not able to see her today and encouraged her to keep her appointment with Dr Marlou Starks on Monday.  Pt verbalized understanding and thanks

## 2021-10-31 NOTE — ED Provider Notes (Addendum)
Shared visit.  Patient here with pain in her right axilla.  History of breast cancer, breast abscess history.  Currently doing packing for breast abscess.  Not currently on antibiotics.  Follows with general surgery for this.  She denies any fevers or chills.  She does have a little bit of red streaking from her breast abscess site up into her right axilla which is tender.  However she has no fever or tachycardia.  Does not appear to be septic.  She has no white count.  Mild lactic acid of 2.1.  Otherwise lab work is unremarkable.  We did get a CT scan of her chest that showed no abscess.  There is may be some mild cellulitic process now in the right axilla.  There is also an incidental finding of a right lower lobe segmental pulmonary embolism.  There is no evidence of right heart strain.  She is not having any chest pain or shortness of breath.  Her hemodynamics are normal.  We talked with general surgery, Dr. Georgette Dover who recommends oral doxycycline.  Overall given blood clot with cancer history and high PESI score did talk with hospitalist and will admit for anticoagulation and PE care.  This chart was dictated using voice recognition software.  Despite best efforts to proofread,  errors can occur which can change the documentation meaning.    Lennice Sites, DO 10/31/21 2135    Lennice Sites, DO 10/31/21 2157

## 2021-10-31 NOTE — H&P (Signed)
History and Physical   [facility] [facility] Admission History and Physical [employer], D.O.    Patient Name: [name] MR#: [id number] Date of Birth: 05-Dec-1960 Date of Admission: 10/31/2021  Referring MD/NP/PA: Dr. Lockie Mola Primary Care Physician: Mayer Masker, PA-C  Chief Complaint:  Chief Complaint  Patient presents with   Abscess    HPI: [name] is a 61 y.o. female with a known history of right breast cancer with lymph node resection, status post I&D of infected seroma of the right breast and debridement on April 30 which has been packed and dressed daily presents to the emergency department for evaluation of right breast pain and swelling.  Patient has been compliant with treatment however she noticed the redness and swelling and pain have been worsening, extending towards her right axilla for the past 3 days  Patient denies fevers/chills, weakness, dizziness, chest pain, shortness of breath, N/V/C/D, abdominal pain, dysuria/frequency, changes in mental status.    Otherwise there has been no change in status. Patient has been taking medication as prescribed and there has been no recent change in medication or diet.  No recent antibiotics.  There has been no recent illness, hospitalizations, travel or sick contacts.    EMS/ED Course: Patient received oral doxycycline, Eliquis, potassium. Medical admission has been requested for further management of cellulitis of the right breast, PE with elevated PESI score.  Review of Systems:  CONSTITUTIONAL: No fever/chills, fatigue, weakness, weight gain/loss, headache. EYES: No blurry or double vision. ENT: No tinnitus, postnasal drip, redness or soreness of the oropharynx. RESPIRATORY: No cough, dyspnea, wheeze.  No hemoptysis.  CARDIOVASCULAR: No chest pain, palpitations, syncope, orthopnea. No lower extremity edema.  GASTROINTESTINAL: No nausea, vomiting, abdominal pain, diarrhea, constipation.  No  hematemesis, melena or hematochezia. GENITOURINARY: No dysuria, frequency, hematuria. ENDOCRINE: No polyuria or nocturia. No heat or cold intolerance. HEMATOLOGY: No anemia, bruising, bleeding. INTEGUMENTARY: Positive right breast pain, swelling, redness and drainage.  No rashes, ulcers, lesions. MUSCULOSKELETAL: No arthritis, gout, dyspnea. NEUROLOGIC: No numbness, tingling, ataxia, seizure-type activity, weakness. PSYCHIATRIC: No anxiety, depression, insomnia.   Past Medical History:  Diagnosis Date   Acid reflux    takes Zantac and Omeprazole daily   Anemia    Anxiety    takes Citaopram daily   Arthritis    right knee   Arthrosis    left thumb CMC   Chronic back pain    DDD   Clotting disorder (HCC)    hx of blood clot following knee scope   Depression    Fibromyalgia    History of bronchitis 3+yrs ago   Hyperlipidemia    takes Atorvastatin daily   Hypertension    takes Lisinopril and HCTZ daily   Insomnia    takes Elavil nightly as needed   Paraesophageal hernia     Past Surgical History:  Procedure Laterality Date   BREAST BIOPSY Left    BREAST LUMPECTOMY WITH RADIOACTIVE SEED AND SENTINEL LYMPH NODE BIOPSY Right 06/03/2021   Procedure: RIGHT BREAST LUMPECTOMY WITH RADIOACTIVE SEED AND SENTINEL LYMPH NODE BIOPSY;  Surgeon: Griselda Miner, MD;  Location:  S[facility]ERY [facility];  Service: General;  Laterality: Right;   BUNIONECTOMY Right 02/18/2018   Procedure: Ivory Broad;  Surgeon: Felecia Shelling, DPM;  Location: [facility];  Service: Podiatry;  Laterality: Right;   CAPSULOTOMY Bilateral 02/18/2018   Procedure: CAPSULOTOMY MPJ RELEASE JOINT 2N BILATERAL;  Surgeon: Felecia Shelling, DPM;  Location: [facility];  Service: Podiatry;  Laterality: Bilateral;   CARPOMETACARPEL SUSPENSION PLASTY Left 01/27/2018   Procedure: LEFT THUMB ligament reconstruction and tendon interposition;  Surgeon: Tarry Kos, MD;  Location: [facility];  Service: Orthopedics;   Laterality: Left;   CARPOMETACARPEL SUSPENSION PLASTY Left 03/07/2020   Procedure: REVISION LEFT THUMB CARPOMETACARPAL (CMC) ARTHROPLASTY;  Surgeon: Tarry Kos, MD;  Location: [facility];  Service: Orthopedics;  Laterality: Left;   CHONDROPLASTY Right 06/28/2014   Procedure: CHONDROPLASTY;  Surgeon: Cheral Almas, MD;  Location: [facility];  Service: Orthopedics;  Laterality: Right;   COLONOSCOPY N/A 05/29/2014   Procedure: COLONOSCOPY;  Surgeon: Corbin Ade, MD;  [facility]: [facility];  Service: Endoscopy;  Laterality: N/A;  215pm- Pt is working until 12:00 so she can't come any earlier   ESOPHAGOGASTRODUODENOSCOPY N/A 05/29/2014   Procedure: ESOPHAGOGASTRODUODENOSCOPY (EGD);  Surgeon: Corbin Ade, MD;  [facility];  Service: Endoscopy;  Laterality: N/A;   ESOPHAGOGASTRODUODENOSCOPY N/A 12/17/2020   Procedure: ESOPHAGOGASTRODUODENOSCOPY (EGD);  Surgeon: Corliss Skains, MD;  Location: [facility];  Service: Thoracic;  Laterality: N/A;   GANGLION CYST EXCISION Left 01/13/2002   HAMMER TOE SURGERY Bilateral 02/18/2018   Procedure: HAMMER TOE CORRECTION2ND BILATERAL;  Surgeon: Felecia Shelling, DPM;  Location: [facility];  Service: Podiatry;  Laterality: Bilateral;   HERNIA REPAIR     IRRIGATION AND DEBRIDEMENT ABSCESS Right 09/08/2021   Procedure: IRRIGATION AND DEBRIDEMENT RIGHT BREAST ABSCESS;  Surgeon: Manus Rudd, MD;  Location: [facility];  Service: General;  Laterality: Right;   KNEE ARTHROSCOPY WITH MEDIAL MENISECTOMY Right 06/28/2014   Procedure: RIGHT KNEE ARTHROSCOPY WITH PARTIAL MEDIAL MENISCECTOMY AND CHONDROPLASTY;  Surgeon: Cheral Almas, MD;  Location:  SURGERY CENTER;  Service: Orthopedics;  Laterality: Right;   MALONEY DILATION N/A 05/29/2014   Procedure: [name] DILATION;  Surgeon: Corbin Ade, MD;  [facility];  Service: Endoscopy;  Laterality: N/A;   PARTIAL KNEE ARTHROPLASTY Right 09/15/2014    Procedure: RIGHT UNICOMPARTMENTAL KNEE ARTHROPLASTY;  Surgeon: Tarry Kos, MD;  Location: [facility];  Service: Orthopedics;  Laterality: Right;   SHOULDER ARTHROSCOPY Right    TOTAL KNEE ARTHROPLASTY Left 04/08/2004   XI ROBOTIC ASSISTED PARAESOPHAGEAL HERNIA REPAIR N/A 12/17/2020   Procedure: XI ROBOTIC ASSISTED Laparoscopy PARAESOPHAGEAL HERNIA REPAIR WITH FUNDOPLICATION;  Surgeon: Corliss Skains, MD;  Location: [facility];  Service: Thoracic;  Laterality: N/A;     reports that she has never smoked. She has never used smokeless tobacco. She reports that she does not drink alcohol and does not use drugs.  Allergies  Allergen Reactions   Tramadol Itching    "bugs crawling all over"    Family History  Problem Relation Age of Onset   Stroke Mother    Lung cancer Father 34   GI problems Father    Prostate cancer Father    Cancer Sister        breast   Breast cancer Sister 71   Lung cancer Maternal Grandfather    Lung cancer Paternal Grandfather    Diabetes Son    Colon cancer Neg Hx    Esophageal cancer Neg Hx    Pancreatic cancer Neg Hx     Prior to Admission medications   Medication Sig Start Date End Date Taking? Authorizing Provider  acetaminophen (TYLENOL) 325 MG tablet Take 2 tablets (650 mg total) by mouth every 6 (six) hours as needed for mild pain (or Fever >/= 101). 09/27/21  Yes  Alwyn Ren, MD  FLUoxetine (PROZAC) 40 MG capsule Take 1 capsule (40 mg total) by mouth daily. Patient taking differently: Take 40 mg by mouth every evening. 10/02/21  Yes Abonza, Maritza, PA-C  hydrochlorothiazide (HYDRODIURIL) 50 MG tablet Take 1 tablet (50 mg total) by mouth daily. Patient taking differently: Take 50 mg by mouth every evening. 10/02/21  Yes Abonza, Maritza, PA-C  letrozole (FEMARA) 2.5 MG tablet Take 1 tablet (2.5 mg total) by mouth daily. Patient taking differently: Take 2.5 mg by mouth every evening. 09/09/21  Yes Serena Croissant, MD  lisinopril (ZESTRIL) 40 MG tablet  Take 1 tablet (40 mg total) by mouth daily. Patient taking differently: Take 40 mg by mouth every evening. 10/02/21  Yes Abonza, Maritza, PA-C  metoprolol succinate (TOPROL-XL) 25 MG 24 hr tablet Take 1 tablet (25 mg total) by mouth daily. Patient taking differently: Take 25 mg by mouth every evening. 10/02/21  Yes Abonza, Maritza, PA-C  ondansetron (ZOFRAN-ODT) 4 MG disintegrating tablet Take 1 tablet (4 mg total) by mouth 2 (two) times daily as needed for nausea or vomiting. 07/19/21  Yes Napoleon Form, MD    Physical Exam: Vitals:   10/31/21 1547 10/31/21 1923 10/31/21 2318  BP: (!) 110/99 131/85 (!) 144/78  Pulse: 85 60 66  Resp: 19 17 16   Temp: 98.9 F (37.2 C)    TempSrc: Oral    SpO2: 100% 100% 97%    GENERAL: 61 y.o.-year-old white female patient, well-developed, well-nourished lying in the bed in no acute distress.  Pleasant and cooperative.   HEENT: Head atraumatic, normocephalic. Pupils equal. Mucus membranes moist. NECK: Supple. No JVD. CHEST: Normal breath sounds bilaterally. No wheezing, rales, rhonchi or crackles. No use of accessory muscles of respiration.  No reproducible chest wall tenderness.  CARDIOVASCULAR: S1, S2 normal. No murmurs, rubs, or gallops. Cap refill <2 seconds. Pulses intact distally.  ABDOMEN: Soft, nondistended, nontender. No rebound, guarding, rigidity. Normoactive bowel sounds present in all four quadrants.  EXTREMITIES: No pedal edema, cyanosis, or clubbing. No calf tenderness or Homan's sign.  NEUROLOGIC: The patient is alert and oriented x 3. Cranial nerves II through XII are grossly intact with no focal sensorimotor deficit. PSYCHIATRIC:  Normal affect, mood, thought content. SKIN: Right breast erythema diffusely.  Lateral incision with packing in place and yellow to green serous drainage.  Diffusely tender right breast.  Skin thickening laterally    Labs on Admission:  CBC: Recent Labs  Lab 10/31/21 1630  WBC 10.1  NEUTROABS 7.0   HGB 12.3  HCT 37.7  MCV 91.3  PLT 332   Basic Metabolic Panel: Recent Labs  Lab 10/31/21 1630  NA 136  K 3.2*  CL 102  CO2 25  GLUCOSE 107*  BUN 21*  CREATININE 1.08*  CALCIUM 9.1   GFR: CrCl cannot be calculated (Unknown ideal weight.). Liver Function Tests: No results for input(s): "AST", "ALT", "ALKPHOS", "BILITOT", "PROT", "ALBUMIN" in the last 168 hours. No results for input(s): "LIPASE", "AMYLASE" in the last 168 hours. No results for input(s): "AMMONIA" in the last 168 hours. Coagulation Profile: No results for input(s): "INR", "PROTIME" in the last 168 hours. Cardiac Enzymes: No results for input(s): "CKTOTAL", "CKMB", "CKMBINDEX", "TROPONINI" in the last 168 hours. BNP (last 3 results) No results for input(s): "PROBNP" in the last 8760 hours. HbA1C: No results for input(s): "HGBA1C" in the last 72 hours. CBG: No results for input(s): "GLUCAP" in the last 168 hours. Lipid Profile: No results for input(s): "CHOL", "HDL", "  LDLCALC", "TRIG", "CHOLHDL", "LDLDIRECT" in the last 72 hours. Thyroid Function Tests: No results for input(s): "TSH", "T4TOTAL", "FREET4", "T3FREE", "THYROIDAB" in the last 72 hours. Anemia Panel: No results for input(s): "VITAMINB12", "FOLATE", "FERRITIN", "TIBC", "IRON", "RETICCTPCT" in the last 72 hours. Urine analysis:    Component Value Date/Time   COLORURINE YELLOW 12/14/2020 1034   APPEARANCEUR CLEAR 12/14/2020 1034   LABSPEC 1.015 12/14/2020 1034   PHURINE 6.0 12/14/2020 1034   GLUCOSEU NEGATIVE 12/14/2020 1034   HGBUR NEGATIVE 12/14/2020 1034   BILIRUBINUR neg 10/02/2021 1008   KETONESUR NEGATIVE 12/14/2020 1034   PROTEINUR Positive (A) 10/02/2021 1008   PROTEINUR NEGATIVE 12/14/2020 1034   UROBILINOGEN 0.2 10/02/2021 1008   UROBILINOGEN 0.2 09/07/2014 1503   NITRITE neg 10/02/2021 1008   NITRITE NEGATIVE 12/14/2020 1034   LEUKOCYTESUR Trace (A) 10/02/2021 1008   LEUKOCYTESUR SMALL (A) 12/14/2020 1034   Sepsis  Labs: @LABRCNTIP (procalcitonin:4,lacticidven:4) )No results found for this or any previous visit (from the past 240 hour(s)).   Radiological Exams on Admission: CT Chest W Contrast  Result Date: 10/31/2021 CLINICAL DATA:  Soft tissue mass. EXAM: CT CHEST WITH CONTRAST TECHNIQUE: Multidetector CT imaging of the chest was performed during intravenous contrast administration. RADIATION DOSE REDUCTION: This exam was performed according to the departmental dose-optimization program which includes automated exposure control, adjustment of the mA and/or kV according to patient size and/or use of iterative reconstruction technique. CONTRAST:  75mL OMNIPAQUE IOHEXOL 300 MG/ML  SOLN COMPARISON:  CT chest 09/24/2021. CT chest 02/28/2019. FINDINGS: Cardiovascular: There is a segmental right lower lobe pulmonary embolism image 2/112. No other pulmonary emboli are seen. Heart is normal in size. Aorta is normal in size. There is no pericardial effusion. Mediastinum/Nodes: No enlarged mediastinal, hilar, or axillary lymph nodes. Thyroid gland, trachea, and esophagus demonstrate no significant findings. Moderate size hiatal hernia is unchanged. Lungs/Pleura: 7 mm right upper lobe pulmonary nodule appears unchanged. The lungs are otherwise clear. There is no pleural effusion or pneumothorax. Upper Abdomen: Gallstones are again seen. Musculoskeletal: Drainage catheter has been removed in the interval. Thick-walled area with central air in the right breast measures 3.0 x 3.1 cm. There is fistulous tract to the lateral skin surface. No fluid identified in this region. Right breast skin thickening persists, but has decreased. There surgical clips in the right axilla. There is some new mild haziness of fat in the right axilla. No acute fractures are seen. IMPRESSION: 1. Right lower lobe segmental pulmonary embolism. No evidence for right heart strain. 2. Drainage catheter has been removed from the right breast. There is a 3.0 x 3.1  cm thick-walled lesion containing air with fistulous tract to the skin surface in the region of previous abscess. No fluid collection identified in this region. 3. Right breast skin thickening has decreased. 4. New mild subcutaneous haziness in the right axilla may be infectious/inflammatory. 5. Unchanged 7 mm right upper lobe nodule. Non-contrast chest CT at 6-12 months is recommended. If the nodule is stable at time of repeat CT, then future CT at 18-24 months (from today's scan) is considered optional for low-risk patients, but is recommended for high-risk patients. This recommendation follows the consensus statement: Guidelines for Management of Incidental Pulmonary Nodules Detected on CT Images: From the Fleischner Society 2017; Radiology 2017; 284:228-243. Electronically Signed   By: Darliss Cheney M.D.   On: 10/31/2021 20:58      Assessment/Plan  This is a 61 y.o. female with a history of right breast cancer with cysts abscess and  cellulitis, hypertension, anxiety and depression now being admitted with:  #.  Right breast cellulitis - Admit inpatient - Continue oral doxycycline per surgery recommendations - Surgery will see patient in a.m.  #.  Incidental finding of right lower lobe segmental pulmonary embolism without any evidence of right heart strain - Continue Eliquis - Check Dopplers of lower extremities -O2 and breathing treatments as needed  #.  Mild hypokalemia - Oral replacement received in the emergency department -Follow BMP in a.m.  #. History of breast cancer - Continue Femara  #. History of hypertension - Continue hydrochlorothiazide, lisinopril, metoprolol  #. History of anxiety and depression - Continue Prozac  Admission status: Inpatient IV Fluids: Normal saline Diet/Nutrition: Heart healthy Consults called: Neurosurgery DVT Px: Eliquis, sCDs and early ambulation. Code Status: Full Code  Disposition Plan: To home in less than 24 hours  All the records are  reviewed and case discussed with ED provider. Management plans discussed with the patient and/or family who express understanding and agree with plan of care.  Walid Haig D.O. on 10/31/2021 at 11:26 PM CC: Primary care physician; Mayer Masker, PA-C   10/31/2021, 11:26 PM

## 2021-10-31 NOTE — ED Provider Triage Note (Cosign Needed)
Emergency Medicine Provider Triage Evaluation Note  [name] , a 61 y.o. female  was evaluated in triage.  Pt complains of breast pain. Hx of R breast CA with surgery.  Developed R breast abscess that was I&D and packed for the past few months.  Report noticing painful lumps in R axillary region and concerns of worsening infection.  No fever, sob.   Review of Systems  Positive: As above Negative: As above  Physical Exam  BP (!) 110/99 (BP Location: Right Arm)   Pulse 85   Temp 98.9 F (37.2 C) (Oral)   Resp 19   LMP 02/07/2013   SpO2 100%  Gen:   Awake, no distress   Resp:  Normal effort  MSK:   Moves extremities without difficulty  Other:    Medical Decision Making  Medically screening exam initiated at 3:49 PM.  Appropriate orders placed.  [name] was informed that the remainder of the evaluation will be completed by another provider, this initial triage assessment does not replace that evaluation, and the importance of remaining in the ED until their evaluation is complete.  May benefit either breast US or chest CT w/ CM to assess for deep tissue infection.  Has reactive lymph nodes to R axillary region.    Domenic Moras, PA-C 10/31/21 1556

## 2021-10-31 NOTE — ED Triage Notes (Signed)
Patient here from home reporting right breast cancer with lymph node removal, reports infection at site for 3 months. Reports painful knots to right breast under arm.

## 2021-11-01 ENCOUNTER — Encounter (HOSPITAL_COMMUNITY): Payer: Self-pay | Admitting: Family Medicine

## 2021-11-01 ENCOUNTER — Other Ambulatory Visit: Payer: Self-pay

## 2021-11-01 ENCOUNTER — Observation Stay (HOSPITAL_BASED_OUTPATIENT_CLINIC_OR_DEPARTMENT_OTHER): Payer: Medicare Other

## 2021-11-01 ENCOUNTER — Observation Stay (HOSPITAL_COMMUNITY): Payer: Medicare Other

## 2021-11-01 DIAGNOSIS — L03313 Cellulitis of chest wall: Secondary | ICD-10-CM | POA: Diagnosis not present

## 2021-11-01 DIAGNOSIS — I2699 Other pulmonary embolism without acute cor pulmonale: Secondary | ICD-10-CM

## 2021-11-01 DIAGNOSIS — E876 Hypokalemia: Secondary | ICD-10-CM | POA: Diagnosis not present

## 2021-11-01 DIAGNOSIS — N61 Mastitis without abscess: Secondary | ICD-10-CM | POA: Diagnosis not present

## 2021-11-01 LAB — BASIC METABOLIC PANEL
Anion gap: 8 (ref 5–15)
BUN: 17 mg/dL (ref 6–20)
CO2: 25 mmol/L (ref 22–32)
Calcium: 8.9 mg/dL (ref 8.9–10.3)
Chloride: 105 mmol/L (ref 98–111)
Creatinine, Ser: 0.9 mg/dL (ref 0.44–1.00)
GFR, Estimated: >60 mL/min (ref >60)
Glucose, Bld: 102 mg/dL — ABNORMAL HIGH (ref 70–99)
Potassium: 3.1 mmol/L — ABNORMAL LOW (ref 3.5–5.1)
Sodium: 138 mmol/L (ref 135–145)

## 2021-11-01 LAB — CBC
HCT: 33.3 % — ABNORMAL LOW (ref 36.0–46.0)
Hemoglobin: 10.8 g/dL — ABNORMAL LOW (ref 12.0–15.0)
MCH: 29.7 pg (ref 26.0–34.0)
MCHC: 32.4 g/dL (ref 30.0–36.0)
MCV: 91.5 fL (ref 80.0–100.0)
Platelets: 283 10*3/uL (ref 150–400)
RBC: 3.64 MIL/uL — ABNORMAL LOW (ref 3.87–5.11)
RDW: 14.5 % (ref 11.5–15.5)
WBC: 8.4 10*3/uL (ref 4.0–10.5)
nRBC: 0 % (ref 0.0–0.2)

## 2021-11-01 MED ORDER — POTASSIUM CHLORIDE CRYS ER 20 MEQ PO TBCR
40.0000 meq | EXTENDED_RELEASE_TABLET | ORAL | Status: AC
  Administered 2021-11-01 (×2): 40 meq via ORAL
  Filled 2021-11-01 (×2): qty 2

## 2021-11-01 MED ORDER — LIDOCAINE HCL 1 % IJ SOLN
INTRAMUSCULAR | Status: AC
  Administered 2021-11-01: 10 mL
  Filled 2021-11-01: qty 20

## 2021-11-01 NOTE — Plan of Care (Signed)
The patient is admitted from ER to 3 [address] 34. A & O x 4. Denied any acute pain. Full assessment to epic completed. Patient voiced no concern. Will continue to monitor.

## 2021-11-01 NOTE — Progress Notes (Signed)
Subjective/Chief Complaint: Right breast pain and drainage, has called our office but not able to get an appt and was told could be seen next week.  Due to pain she came to er.  Also found to have a RLL PE.  She had prior lump/sn by Dr [name] and then radiotherapy. This was followed by an infection that was then taken to or for infected seroma- did not appear purulent. Penrose placed which has been removed. This was 4/30.  Has ct here that still shows collection and having milky drainage   Objective: Vital signs in last 24 hours: Temp:  [98 F (36.7 C)-98.9 F (37.2 C)] 98.3 F (36.8 C) (06/23 0531) Pulse Rate:  [60-85] 67 (06/23 0531) Resp:  [16-19] 17 (06/23 0531) BP: (110-147)/(61-99) 122/61 (06/23 0531) SpO2:  [97 %-100 %] 98 % (06/23 0531) Weight:  [101.2 kg] 101.2 kg (06/23 0051) Last BM Date : 10/31/21  Intake/Output from previous day: 06/22 0701 - 06/23 0700 In: 462 [I.V.:462] Out: -  Intake/Output this shift: No intake/output data recorded.  Right breast with mild erythema (dont really think this is cellulitis ), tender at site small opening with purulent drainage  Lab Results:  Recent Labs    10/31/21 1630 11/01/21 0342  WBC 10.1 8.4  HGB 12.3 10.8*  HCT 37.7 33.3*  PLT 332 283   BMET Recent Labs    10/31/21 1630 11/01/21 0342  NA 136 138  K 3.2* 3.1*  CL 102 105  CO2 25 25  GLUCOSE 107* 102*  BUN 21* 17  CREATININE 1.08* 0.90  CALCIUM 9.1 8.9   PT/INR No results for input(s): "LABPROT", "INR" in the last 72 hours. ABG No results for input(s): "PHART", "HCO3" in the last 72 hours.  Invalid input(s): "PCO2", "PO2"  Studies/Results: CT Chest W Contrast  Result Date: 10/31/2021 CLINICAL DATA:  Soft tissue mass. EXAM: CT CHEST WITH CONTRAST TECHNIQUE: Multidetector CT imaging of the chest was performed during intravenous contrast administration. RADIATION DOSE REDUCTION: This exam was performed according to the departmental dose-optimization  program which includes automated exposure control, adjustment of the mA and/or kV according to patient size and/or use of iterative reconstruction technique. CONTRAST:  75mL OMNIPAQUE IOHEXOL 300 MG/ML  SOLN COMPARISON:  CT chest 09/24/2021. CT chest 02/28/2019. FINDINGS: Cardiovascular: There is a segmental right lower lobe pulmonary embolism image 2/112. No other pulmonary emboli are seen. Heart is normal in size. Aorta is normal in size. There is no pericardial effusion. Mediastinum/Nodes: No enlarged mediastinal, hilar, or axillary lymph nodes. Thyroid gland, trachea, and esophagus demonstrate no significant findings. Moderate size hiatal hernia is unchanged. Lungs/Pleura: 7 mm right upper lobe pulmonary nodule appears unchanged. The lungs are otherwise clear. There is no pleural effusion or pneumothorax. Upper Abdomen: Gallstones are again seen. Musculoskeletal: Drainage catheter has been removed in the interval. Thick-walled area with central air in the right breast measures 3.0 x 3.1 cm. There is fistulous tract to the lateral skin surface. No fluid identified in this region. Right breast skin thickening persists, but has decreased. There surgical clips in the right axilla. There is some new mild haziness of fat in the right axilla. No acute fractures are seen. IMPRESSION: 1. Right lower lobe segmental pulmonary embolism. No evidence for right heart strain. 2. Drainage catheter has been removed from the right breast. There is a 3.0 x 3.1 cm thick-walled lesion containing air with fistulous tract to the skin surface in the region of previous abscess. No fluid collection identified  in this region. 3. Right breast skin thickening has decreased. 4. New mild subcutaneous haziness in the right axilla may be infectious/inflammatory. 5. Unchanged 7 mm right upper lobe nodule. Non-contrast chest CT at 6-12 months is recommended. If the nodule is stable at time of repeat CT, then future CT at 18-24 months (from today's  scan) is considered optional for low-risk patients, but is recommended for high-risk patients. This recommendation follows the consensus statement: Guidelines for Management of Incidental Pulmonary Nodules Detected on CT Images: From the Fleischner Society 2017; Radiology 2017; 284:228-243. Electronically Signed   By: Darliss Cheney M.D.   On: 10/31/2021 20:58    Anti-infectives: Anti-infectives (From admission, onward)    Start     Dose/Rate Route Frequency Ordered Stop   11/01/21 1000  doxycycline (VIBRA-TABS) tablet 100 mg        100 mg Oral Every 12 hours 10/31/21 2355     10/31/21 2130  doxycycline (VIBRA-TABS) tablet 100 mg        100 mg Oral  Once 10/31/21 2118 10/31/21 2208       Assessment/Plan: Right breast abscess s/p lumpectomy/radiotherapy PE -I think ideal treatment for this is to go to OR and drain this more widely and let heal by secondary intention at this point although difficult with radiotherapy. Unfortunately with PE and eliquis now this is not really possible today.  Continue packing this daily and allowing to drain. Will also ask IR if they can aspirate this at all given fact that surgery not as much of an option at this point and that at least could temporize this. -likely home soon on abx p  I reviewed ED provider notes, hospitalist notes, last 24 h vitals and pain scores, last 24 h labs and trends, and last 24 h imaging results.  This care required moderate level of medical decision making.    Emelia Loron 11/01/2021

## 2021-11-01 NOTE — Progress Notes (Signed)
[facility]  PROGRESS NOTE  [name] YQM:[id number] DOB: April 26, 1961 DOA: 10/31/2021 PCP: Mayer Masker, PA-C   Brief HPI:   61y.o. female with a known history of right breast cancer with lymph node resection, status post I&D of infected seroma of the right breast and debridement on April 30 which has been packed and dressed daily presents to the emergency department for evaluation of right breast pain and swelling.  Patient has been compliant with treatment however she noticed the redness and swelling and pain have been worsening, extending towards her right axilla for the past 3 days  General surgery was consulted; patient started on doxycycline    Subjective   Patient seen and examined, denies any pain.   Assessment/Plan:    Right breast cellulitis/abscess -Seen by general surgery; no surgical intervention planned as patient is on Eliquis and also on radiotherapy -General surgery planning to get IR to aspirate the abscess and sent home on antibiotics -Continue doxycycline  Pulmonary embolism -Incidental finding of right lower lobe segmental pulmonary embolism without evidence of right heart strain -Patient started on Eliquis -Venous duplex of lower extremities is negative for DVT  Hypokalemia -Potassium is 3.1 -Replace potassium with K. Dur 40 mEq p.o. x2 -Follow BMP in am  History of breast cancer -Continue Femara  History of hypertension -Continue HCTZ, lisinopril, metoprolol  History of anxiety/depression -Continue Prozac     Medications     apixaban  10 mg Oral BID   Followed by   Melene Muller ON 11/07/2021] apixaban  5 mg Oral BID   doxycycline  100 mg Oral Q12H   FLUoxetine  40 mg Oral QPM   hydrochlorothiazide  50 mg Oral QPM   letrozole  2.5 mg Oral QPM   lisinopril  40 mg Oral QPM   metoprolol succinate  25 mg Oral QPM     Data Reviewed:   CBG:  No results for input(s): "GLUCAP" in the last 168 hours.  SpO2: 97 %    Vitals:    11/01/21 0051 11/01/21 0531 11/01/21 0931 11/01/21 1324  BP:  122/61 (!) 131/101 139/77  Pulse:  67 84 89  Resp:  17 18 18   Temp:  98.3 F (36.8 C) 98 F (36.7 C) 98.7 F (37.1 C)  TempSrc:  Oral Oral Oral  SpO2:  98% 97% 97%  Weight: 101.2 kg     Height: 5\' 4"  (1.626 m)         Data Reviewed:  Basic Metabolic Panel: Recent Labs  Lab 10/31/21 1630 11/01/21 0342  NA 136 138  K 3.2* 3.1*  CL 102 105  CO2 25 25  GLUCOSE 107* 102*  BUN 21* 17  CREATININE 1.08* 0.90  CALCIUM 9.1 8.9    CBC: Recent Labs  Lab 10/31/21 1630 11/01/21 0342  WBC 10.1 8.4  NEUTROABS 7.0  --   HGB 12.3 10.8*  HCT 37.7 33.3*  MCV 91.3 91.5  PLT 332 283    LFT No results for input(s): "AST", "ALT", "ALKPHOS", "BILITOT", "PROT", "ALBUMIN" in the last 168 hours.   Antibiotics: Anti-infectives (From admission, onward)    Start     Dose/Rate Route Frequency Ordered Stop   11/01/21 1000  doxycycline (VIBRA-TABS) tablet 100 mg        100 mg Oral Every 12 hours 10/31/21 2355     10/31/21 2130  doxycycline (VIBRA-TABS) tablet 100 mg        100 mg Oral  Once 10/31/21 2118 10/31/21 2208  DVT prophylaxis: Eliquis  Code Status: Full code  Family Communication: No family at bedside   CONSULTS General surgery   Objective    Physical Examination:   General-appears in no acute distress Heart-S1-S2, regular, no murmur auscultated Lungs-clear to auscultation bilaterally, no wheezing or crackles auscultated Abdomen-soft, nontender, no organomegaly Extremities-no edema in the lower extremities Neuro-alert, oriented x3, no focal deficit noted   Status is: Inpatient: Breast abscess           Nyjai Graff S Trayveon Beckford   [facility] If 7PM-7AM, please contact night-coverage at [email or web address], Office  [phone]   11/01/2021, 3:01 PM  LOS: 0 days

## 2021-11-02 ENCOUNTER — Observation Stay (HOSPITAL_COMMUNITY): Payer: Medicare Other

## 2021-11-02 ENCOUNTER — Encounter (HOSPITAL_COMMUNITY): Payer: Self-pay | Admitting: Family Medicine

## 2021-11-02 DIAGNOSIS — N61 Mastitis without abscess: Secondary | ICD-10-CM | POA: Diagnosis not present

## 2021-11-02 DIAGNOSIS — I2699 Other pulmonary embolism without acute cor pulmonale: Secondary | ICD-10-CM | POA: Diagnosis not present

## 2021-11-02 LAB — CBC
HCT: 33.9 % — ABNORMAL LOW (ref 36.0–46.0)
Hemoglobin: 10.6 g/dL — ABNORMAL LOW (ref 12.0–15.0)
MCH: 29.4 pg (ref 26.0–34.0)
MCHC: 31.3 g/dL (ref 30.0–36.0)
MCV: 94.2 fL (ref 80.0–100.0)
Platelets: 290 10*3/uL (ref 150–400)
RBC: 3.6 MIL/uL — ABNORMAL LOW (ref 3.87–5.11)
RDW: 14.7 % (ref 11.5–15.5)
WBC: 5.7 10*3/uL (ref 4.0–10.5)
nRBC: 0 % (ref 0.0–0.2)

## 2021-11-02 LAB — BASIC METABOLIC PANEL
Anion gap: 8 (ref 5–15)
BUN: 16 mg/dL (ref 6–20)
CO2: 23 mmol/L (ref 22–32)
Calcium: 8.9 mg/dL (ref 8.9–10.3)
Chloride: 108 mmol/L (ref 98–111)
Creatinine, Ser: 0.9 mg/dL (ref 0.44–1.00)
GFR, Estimated: >60 mL/min (ref >60)
Glucose, Bld: 100 mg/dL — ABNORMAL HIGH (ref 70–99)
Potassium: 3.6 mmol/L (ref 3.5–5.1)
Sodium: 139 mmol/L (ref 135–145)

## 2021-11-02 MED ORDER — OMEPRAZOLE MAGNESIUM 20 MG PO TBEC: 20.0000 mg | DELAYED_RELEASE_TABLET | Freq: Every day | ORAL | 0 refills | Status: DC

## 2021-11-02 MED ORDER — ALBUTEROL SULFATE HFA 108 (90 BASE) MCG/ACT IN AERS: 2.0000 | INHALATION_SPRAY | Freq: Four times a day (QID) | RESPIRATORY_TRACT | 2 refills | Status: DC | PRN

## 2021-11-02 MED ORDER — DM-GUAIFENESIN ER 30-600 MG PO TB12: 1.0000 | ORAL_TABLET | Freq: Two times a day (BID) | ORAL | 0 refills | Status: DC

## 2021-11-02 MED ORDER — APIXABAN (ELIQUIS) VTE STARTER PACK (10MG AND 5MG): ORAL_TABLET | ORAL | 0 refills | Status: DC

## 2021-11-02 MED ORDER — GUAIFENESIN-DM 100-10 MG/5ML PO SYRP
5.0000 mL | ORAL_SOLUTION | ORAL | Status: DC | PRN
  Administered 2021-11-02: 5 mL via ORAL
  Filled 2021-11-02: qty 10

## 2021-11-02 MED ORDER — PANTOPRAZOLE SODIUM 40 MG PO TBEC
40.0000 mg | DELAYED_RELEASE_TABLET | Freq: Every day | ORAL | Status: DC
  Administered 2021-11-02: 40 mg via ORAL
  Filled 2021-11-02: qty 1

## 2021-11-02 NOTE — Discharge Summary (Signed)
Physician Discharge Summary  [name]:[id number] DOB: 07-03-1960 DOA: 10/31/2021  PCP: Mayer Masker, PA-C  Admit date: 10/31/2021 Discharge date: 11/02/2021  Admitted From: Home Disposition: Home  Recommendations for Outpatient Follow-up:  Follow up with PCP in 1-2 weeks Please obtain BMP/CBC in one week Follow-up with general surgery scheduled Consider changing lisinopril to ARB if cough persists:  Home Health: Equipment/Devices:  Discharge Condition: Stable CODE STATUS: Full code Diet recommendation: Heart healthy  Brief/Interim Summary: 61y.o. female with a known history of right breast cancer with lymph node resection, status post I&D of infected seroma of the right breast and debridement on April 30 which has been packed and dressed daily presents to the emergency department for evaluation of right breast pain and swelling.  Patient has been compliant with treatment however she noticed the redness and swelling and pain have been worsening, extending towards her right axilla for the past 3 days   General surgery was consulted; patient started on doxycycline  Discharge Diagnoses:  Principal Problem:   Cellulitis of right breast  Right breast cellulitis/abscess -Seen by general surgery; no surgical intervention planned as patient is on Eliquis and also on radiotherapy -Patient seen by IR, and aspiration of fluid collection was attempted, although no fluid could be aspirated -It was felt that patient did not have a true abscess and only has an empty cavity at this point -She will plan to follow-up with general surgery for further wound management, no antibiotics indicated at this time   Pulmonary embolism -Incidental finding of right lower lobe segmental pulmonary embolism without evidence of right heart strain -Patient started on Eliquis -Venous duplex of lower extremities is negative for DVT   Hypokalemia -Potassium is 3.1 -Replaced   History of breast  cancer -Continue Femara   History of hypertension -Continue HCTZ, lisinopril, metoprolol   History of anxiety/depression -Continue Prozac  Cough -Appears to be relatively acute in onset -No prior history of tobacco use or asthma -Chest x-ray repeated prior to discharge did not show any acute findings -Started on PPI, antitussives -She has been on lisinopril for the past 5 years without any issues -Would consider changing to losartan if her cough persists as an outpatient  Discharge Instructions  Discharge Instructions     Diet - low sodium heart healthy   Complete by: As directed    Discharge wound care:   Complete by: As directed    Cleanse with NS, pat gently dry. Fill defects with iodoform gauze, top with dry gauze, ABD pads and secure with tape. Change PRN for soiling, otherwise daily   Increase activity slowly   Complete by: As directed       Allergies as of 11/02/2021       Reactions   Tramadol Itching   "bugs crawling all over"        Medication List     TAKE these medications    acetaminophen 325 MG tablet Commonly known as: TYLENOL Take 2 tablets (650 mg total) by mouth every 6 (six) hours as needed for mild pain (or Fever >/= 101).   albuterol 108 (90 Base) MCG/ACT inhaler Commonly known as: VENTOLIN HFA Inhale 2 puffs into the lungs every 6 (six) hours as needed for wheezing or shortness of breath.   Apixaban Starter Pack (10mg  and 5mg ) Commonly known as: ELIQUIS STARTER PACK Take as directed on package: start with two-5mg  tablets twice daily for 7 days. On day 8, switch to one-5mg  tablet twice daily.   dextromethorphan-guaiFENesin  30-600 MG 12hr tablet Commonly known as: MUCINEX DM Take 1 tablet by mouth 2 (two) times daily.   FLUoxetine 40 MG capsule Commonly known as: PROZAC Take 1 capsule (40 mg total) by mouth daily. What changed: when to take this   hydrochlorothiazide 50 MG tablet Commonly known as: HYDRODIURIL Take 1 tablet (50 mg  total) by mouth daily. What changed: when to take this   letrozole 2.5 MG tablet Commonly known as: FEMARA Take 1 tablet (2.5 mg total) by mouth daily. What changed: when to take this   lisinopril 40 MG tablet Commonly known as: ZESTRIL Take 1 tablet (40 mg total) by mouth daily. What changed: when to take this   metoprolol succinate 25 MG 24 hr tablet Commonly known as: TOPROL-XL Take 1 tablet (25 mg total) by mouth daily. What changed: when to take this   omeprazole 20 MG tablet Commonly known as: PriLOSEC OTC Take 1 tablet (20 mg total) by mouth daily.   ondansetron 4 MG disintegrating tablet Commonly known as: ZOFRAN-ODT Take 1 tablet (4 mg total) by mouth 2 (two) times daily as needed for nausea or vomiting.               Discharge Care Instructions  (From admission, onward)           Start     Ordered   11/02/21 0000  Discharge wound care:       Comments: Cleanse with NS, pat gently dry. Fill defects with iodoform gauze, top with dry gauze, ABD pads and secure with tape. Change PRN for soiling, otherwise daily   11/02/21 1445            Follow-up Information     Emelia Loron, MD Follow up in 2 week(s).   Specialty: General Surgery Contact information: [address] [place] [address] [phone]         Mayer Masker, PA-C. Schedule an appointment as soon as possible for a visit in 2 week(s).   Specialty: Physician Assistant Contact information: [address] [place] [address] [phone]                Allergies  Allergen Reactions   Tramadol Itching    "bugs crawling all over"    Consultations: General surgery Interventional radiology   Procedures/Studies: DG CHEST PORT 1 VIEW  Result Date: 11/02/2021 CLINICAL DATA:  Cough EXAM: PORTABLE CHEST 1 VIEW COMPARISON:  September 08, 2021 FINDINGS: The cardiac silhouette is prominent but stable and not well evaluated due to  portable technique. The hila, mediastinum, lungs, and pleura are unremarkable. IMPRESSION: No acute abnormality. Continued prominence of the cardiac silhouette, poorly assessed on a portable film. Electronically Signed   By: Gerome Sam III M.D.   On: 11/02/2021 13:45   VAS [place] LOWER EXTREMITY VENOUS (DVT)  Result Date: 11/01/2021  Lower Venous DVT Study Patient Name:  MERLE HAUCK  Date of Exam:   11/01/2021 Medical Rec #: [id number]    Accession #:    [id number] Date of Birth: [birth year]    Patient Gender: F Patient Age:   80 years Exam Location:  [facility] Procedure:      VAS [place] LOWER EXTREMITY VENOUS (DVT) Referring Phys: ALEXIS HUGELMEYER --------------------------------------------------------------------------------  Indications: Pulmonary embolism.  Comparison Study: Previous exam on 09/2014 was negative for DVT. Performing Technologist: Ernestene Mention RVT, RDMS  Examination Guidelines: A complete evaluation includes B-mode imaging, spectral Doppler, color Doppler, and power  Doppler as needed of all accessible portions of each vessel. Bilateral testing is considered an integral part of a complete examination. Limited examinations for reoccurring indications may be performed as noted. The reflux portion of the exam is performed with the patient in reverse Trendelenburg.  +---------+---------------+---------+-----------+----------+--------------+ RIGHT    CompressibilityPhasicitySpontaneityPropertiesThrombus Aging +---------+---------------+---------+-----------+----------+--------------+ CFV      Full           Yes      Yes                                 +---------+---------------+---------+-----------+----------+--------------+ SFJ      Full                                                        +---------+---------------+---------+-----------+----------+--------------+ FV Prox  Full           Yes      Yes                                  +---------+---------------+---------+-----------+----------+--------------+ FV Mid   Full           Yes      Yes                                 +---------+---------------+---------+-----------+----------+--------------+ FV DistalFull           Yes      Yes                                 +---------+---------------+---------+-----------+----------+--------------+ PFV      Full                                                        +---------+---------------+---------+-----------+----------+--------------+ POP      Full           Yes      Yes                                 +---------+---------------+---------+-----------+----------+--------------+ PTV      Full                                                        +---------+---------------+---------+-----------+----------+--------------+ PERO     Full                                                        +---------+---------------+---------+-----------+----------+--------------+   +---------+---------------+---------+-----------+----------+-------------------+ LEFT     CompressibilityPhasicitySpontaneityPropertiesThrombus Aging      +---------+---------------+---------+-----------+----------+-------------------+ CFV  Full           Yes      Yes                                      +---------+---------------+---------+-----------+----------+-------------------+ SFJ      Full                                                             +---------+---------------+---------+-----------+----------+-------------------+ FV Prox  Full           Yes      Yes                                      +---------+---------------+---------+-----------+----------+-------------------+ FV Mid   Full           Yes      Yes                                      +---------+---------------+---------+-----------+----------+-------------------+ FV DistalFull           Yes      Yes                                       +---------+---------------+---------+-----------+----------+-------------------+ PFV      Full                                                             +---------+---------------+---------+-----------+----------+-------------------+ POP      Full           Yes      Yes                                      +---------+---------------+---------+-----------+----------+-------------------+ PTV      Full                                                             +---------+---------------+---------+-----------+----------+-------------------+ PERO                                                  Not well visualized +---------+---------------+---------+-----------+----------+-------------------+     Summary: BILATERAL: - No evidence of deep vein thrombosis seen in the lower extremities, bilaterally. -No evidence of popliteal cyst, bilaterally.   *See table(s) above for measurements and observations. Electronically signed by Sherald Hess MD on 11/01/2021 at 6:18:11 PM.  Final    CT Chest W Contrast  Result Date: 10/31/2021 CLINICAL DATA:  Soft tissue mass. EXAM: CT CHEST WITH CONTRAST TECHNIQUE: Multidetector CT imaging of the chest was performed during intravenous contrast administration. RADIATION DOSE REDUCTION: This exam was performed according to the departmental dose-optimization program which includes automated exposure control, adjustment of the mA and/or kV according to patient size and/or use of iterative reconstruction technique. CONTRAST:  75mL OMNIPAQUE IOHEXOL 300 MG/ML  SOLN COMPARISON:  CT chest 09/24/2021. CT chest 02/28/2019. FINDINGS: Cardiovascular: There is a segmental right lower lobe pulmonary embolism image 2/112. No other pulmonary emboli are seen. Heart is normal in size. Aorta is normal in size. There is no pericardial effusion. Mediastinum/Nodes: No enlarged mediastinal, hilar, or axillary lymph nodes. Thyroid gland, trachea, and esophagus  demonstrate no significant findings. Moderate size hiatal hernia is unchanged. Lungs/Pleura: 7 mm right upper lobe pulmonary nodule appears unchanged. The lungs are otherwise clear. There is no pleural effusion or pneumothorax. Upper Abdomen: Gallstones are again seen. Musculoskeletal: Drainage catheter has been removed in the interval. Thick-walled area with central air in the right breast measures 3.0 x 3.1 cm. There is fistulous tract to the lateral skin surface. No fluid identified in this region. Right breast skin thickening persists, but has decreased. There surgical clips in the right axilla. There is some new mild haziness of fat in the right axilla. No acute fractures are seen. IMPRESSION: 1. Right lower lobe segmental pulmonary embolism. No evidence for right heart strain. 2. Drainage catheter has been removed from the right breast. There is a 3.0 x 3.1 cm thick-walled lesion containing air with fistulous tract to the skin surface in the region of previous abscess. No fluid collection identified in this region. 3. Right breast skin thickening has decreased. 4. New mild subcutaneous haziness in the right axilla may be infectious/inflammatory. 5. Unchanged 7 mm right upper lobe nodule. Non-contrast chest CT at 6-12 months is recommended. If the nodule is stable at time of repeat CT, then future CT at 18-24 months (from today's scan) is considered optional for low-risk patients, but is recommended for high-risk patients. This recommendation follows the consensus statement: Guidelines for Management of Incidental Pulmonary Nodules Detected on CT Images: From the Fleischner Society 2017; Radiology 2017; 284:228-243. Electronically Signed   By: Darliss Cheney M.D.   On: 10/31/2021 20:58      Subjective: Reports developing a cough overnight.  This is mostly nonproductive.  Otherwise she does not have any complaints  Discharge Exam: Vitals:   11/01/21 2120 11/02/21 0544 11/02/21 0855 11/02/21 1337  BP:  (!) 125/53 (!) 142/76  (!) 143/65  Pulse: 73 66  71  Resp: 20 18  20   Temp: 98.2 F (36.8 C) 97.9 F (36.6 C)  98.4 F (36.9 C)  TempSrc: Oral Oral  Oral  SpO2: 98% 97% 99% 99%  Weight:      Height:        General: Pt is alert, awake, not in acute distress Cardiovascular: RRR, S1/S2 +, no rubs, no gallops Respiratory: CTA bilaterally, no wheezing, no rhonchi Abdominal: Soft, NT, ND, bowel sounds + Extremities: no edema, no cyanosis    The results of significant diagnostics from this hospitalization (including imaging, microbiology, ancillary and laboratory) are listed below for reference.     Microbiology: No results found for this or any previous visit (from the past 240 hour(s)).   Labs: BNP (last 3 results) No results for input(s): "BNP" in the last 8760 hours.  Basic Metabolic Panel: Recent Labs  Lab 10/31/21 1630 11/01/21 0342 11/02/21 0337  NA 136 138 139  K 3.2* 3.1* 3.6  CL 102 105 108  CO2 25 25 23   GLUCOSE 107* 102* 100*  BUN 21* 17 16  CREATININE 1.08* 0.90 0.90  CALCIUM 9.1 8.9 8.9   Liver Function Tests: No results for input(s): "AST", "ALT", "ALKPHOS", "BILITOT", "PROT", "ALBUMIN" in the last 168 hours. No results for input(s): "LIPASE", "AMYLASE" in the last 168 hours. No results for input(s): "AMMONIA" in the last 168 hours. CBC: Recent Labs  Lab 10/31/21 1630 11/01/21 0342 11/02/21 0337  WBC 10.1 8.4 5.7  NEUTROABS 7.0  --   --   HGB 12.3 10.8* 10.6*  HCT 37.7 33.3* 33.9*  MCV 91.3 91.5 94.2  PLT 332 283 290   Cardiac Enzymes: No results for input(s): "CKTOTAL", "CKMB", "CKMBINDEX", "TROPONINI" in the last 168 hours. BNP: Invalid input(s): "POCBNP" CBG: No results for input(s): "GLUCAP" in the last 168 hours. D-Dimer No results for input(s): "DDIMER" in the last 72 hours. Hgb A1c No results for input(s): "HGBA1C" in the last 72 hours. Lipid Profile No results for input(s): "CHOL", "HDL", "LDLCALC", "TRIG", "CHOLHDL", "LDLDIRECT"  in the last 72 hours. Thyroid function studies No results for input(s): "TSH", "T4TOTAL", "T3FREE", "THYROIDAB" in the last 72 hours.  Invalid input(s): "FREET3" Anemia work up No results for input(s): "VITAMINB12", "FOLATE", "FERRITIN", "TIBC", "IRON", "RETICCTPCT" in the last 72 hours. Urinalysis    Component Value Date/Time   COLORURINE YELLOW 12/14/2020 1034   APPEARANCEUR CLEAR 12/14/2020 1034   LABSPEC 1.015 12/14/2020 1034   PHURINE 6.0 12/14/2020 1034   GLUCOSEU NEGATIVE 12/14/2020 1034   HGBUR NEGATIVE 12/14/2020 1034   BILIRUBINUR neg 10/02/2021 1008   KETONESUR NEGATIVE 12/14/2020 1034   PROTEINUR Positive (A) 10/02/2021 1008   PROTEINUR NEGATIVE 12/14/2020 1034   UROBILINOGEN 0.2 10/02/2021 1008   UROBILINOGEN 0.2 09/07/2014 1503   NITRITE neg 10/02/2021 1008   NITRITE NEGATIVE 12/14/2020 1034   LEUKOCYTESUR Trace (A) 10/02/2021 1008   LEUKOCYTESUR SMALL (A) 12/14/2020 1034   Sepsis Labs Recent Labs  Lab 10/31/21 1630 11/01/21 0342 11/02/21 0337  WBC 10.1 8.4 5.7   Microbiology No results found for this or any previous visit (from the past 240 hour(s)).   Time coordinating discharge:  SIGNED:   Erick Blinks, MD  [facility] 11/02/2021, 7:51 PM   If 7PM-7AM, please contact night-coverage [email or web address]

## 2021-11-02 NOTE — Progress Notes (Signed)
Pt stable at time of d/c instructions and education. No needs at this time. Pt awaiting ride home.

## 2021-11-05 ENCOUNTER — Telehealth: Payer: Self-pay

## 2021-11-07 ENCOUNTER — Inpatient Hospital Stay: Payer: Medicaid Other | Admitting: Adult Health

## 2021-11-14 NOTE — Patient Instructions (Addendum)
Pulmonary Embolism  A pulmonary embolism (PE) is a sudden blockage or decrease of blood flow in one or both lungs that happens when a clot travels into the arteries of the lung (pulmonary arteries). Most blockages come from a blood clot that forms in the vein of a leg or arm (deep vein thrombosis, DVT) and travels to the lungs. A clot is blood that has thickened into a gel or solid. PE is a dangerous and life-threatening condition that needs to be treated right away. What are the causes? This condition is usually caused by a blood clot that forms in a vein and moves to the lungs. In rare cases, it may be caused by air, fat, part of a tumor, or other tissue that moves through the veins and into the lungs. What increases the risk? The following factors may make you more likely to develop this condition: Experiencing a traumatic injury, such as breaking a hip or leg. Having: A spinal cord injury. Major surgery, especially hip or knee replacement, or surgery on parts of the nervous system or on the abdomen. A stroke. A blood-clotting disease. Long-term (chronic) lung or heart disease. Cancer, especially if you are being treated with chemotherapy. A central venous catheter. Taking medicines that contain estrogen. These include birth control pills and hormone replacement therapy. Being: Pregnant. In the period of time after your baby is delivered (postpartum). Older than age 60. Overweight. A smoker, especially if you have other risks. Not very active (sedentary), not being able to move at all, or spending long periods sitting, such as travel over 6 hours. You are also at a greater risk if you have a leg in a cast or splint. What are the signs or symptoms? Symptoms of this condition usually start suddenly and include: Shortness of breath during activity or at rest. Coughing, coughing up blood, or coughing up bloody mucus. Chest pain, back pain, or shoulder blade pain that gets worse with deep  breaths. Rapid or irregular heartbeat. Feeling light-headed or dizzy, or fainting. Feeling anxious. Pain and swelling in a leg. This is a symptom of DVT, which can lead to PE. How is this diagnosed? This condition may be diagnosed based on your medical history, a physical exam, and tests. Tests may include: Blood tests. An ECG (electrocardiogram) of the heart. A CT pulmonary angiogram. This test checks blood flow in and around your lungs. A ventilation-perfusion scan, also called a lung VQ scan. This test measures air flow and blood flow to the lungs. An ultrasound to check for a DVT. How is this treated? Treatment for this condition depends on many factors, such as the cause of your PE, your risk for bleeding or developing more clots, and other medical conditions you may have. Treatment aims to stop blood clots from forming or growing larger. In some cases, treatment may be aimed at breaking apart or removing the blood clot. Treatment may include: Medicines, such as: Blood thinning medicines, also called anticoagulants, to stop clots from forming and growing. Medicines that break apart clots (fibrinolytics). Procedures, such as: Using a flexible tube to remove a blood clot (embolectomy) or to deliver medicine to destroy it (catheter-directed thrombolysis). Surgery to remove the clot (surgical embolectomy). This is rare. You may need a combination of immediate, long-term, and extended treatments. Your treatment may continue for several months (maintenance therapy) or longer depending on your medical conditions. You and your health care provider will work together to choose the treatment program that is best for you.   Follow these instructions at home: Medicines Take over-the-counter and prescription medicines only as told by your health care provider. If you are taking blood thinners: Talk with your health care provider before you take any medicines that contain aspirin or NSAIDs, such as  ibuprofen. These medicines increase your risk for dangerous bleeding. Take your medicine exactly as told, at the same time every day. Avoid activities that could cause injury or bruising, and follow instructions about how to prevent falls. Wear a medical alert bracelet or carry a card that lists what medicines you take. Understand what foods and drugs interact with any medicines that you are taking. General instructions Ask your health care provider when you may return to your normal activities. Avoid sitting or lying for a long time without moving. Maintain a healthy weight. Ask your health care provider what weight is healthy for you. Do not use any products that contain nicotine or tobacco. These products include cigarettes, chewing tobacco, and vaping devices, such as e-cigarettes. If you need help quitting, ask your health care provider. Talk with your health care provider about any travel plans. It is important to make sure that you are still able to take your medicine while traveling. Keep all follow-up visits. This is important. Where to find more information American Lung Association: www.lung.org Centers for Disease Control and Prevention: www.cdc.gov Contact a health care provider if: You missed a dose of your blood thinner medicine. You have a fever. Get help right away if: You have: New or increased pain, swelling, warmth, or redness in an arm or leg. Shortness of breath that gets worse during activity or at rest. Worsening chest pain. A rapid or irregular heartbeat. A severe headache. Vision changes. A serious fall or accident, or you hit your head. Blood in your vomit, stool, or urine. A cut that will not stop bleeding. You cough up blood. You feel light-headed or dizzy, and that feeling does not go away. You cannot move your arms or legs. You are confused or have memory loss. These symptoms may represent a serious problem that is an emergency. Do not wait to see if the  symptoms will go away. Get medical help right away. Call your local emergency services (911 in the [place] Do not drive yourself to the hospital. Summary A pulmonary embolism (PE) is a serious and potentially life-threatening condition. It happens when a blood clot from one part of the body travels to the arteries of the lung, causing a sudden blockage or decrease of blood flow to the lungs. This may result in shortness of breath, chest pain, dizziness, and fainting. Treatments for this condition usually include medicines to thin your blood (anticoagulants) or medicines to break apart blood clots. If you are given blood thinners, take your medicine exactly as told by your health care provider, at the same time every day. This is important. Understand what foods and drugs interact with any medicines that you are taking. If you have signs of PE or DVT, call your local emergency services (911 in the [place] This information is not intended to replace advice given to you by your health care provider. Make sure you discuss any questions you have with your health care provider. Document Revised: 03/30/2020 Document Reviewed: 03/30/2020 Elsevier Patient Education  2023 [employer]  

## 2021-11-20 ENCOUNTER — Encounter: Payer: Self-pay | Admitting: Physician Assistant

## 2021-11-20 ENCOUNTER — Ambulatory Visit (INDEPENDENT_AMBULATORY_CARE_PROVIDER_SITE_OTHER): Payer: Medicare Other | Admitting: Physician Assistant

## 2021-11-20 VITALS — BP 96/66 | HR 60 | Temp 97.7°F | Ht 63.5 in | Wt 219.0 lb

## 2021-11-20 DIAGNOSIS — F419 Anxiety disorder, unspecified: Secondary | ICD-10-CM

## 2021-11-20 DIAGNOSIS — I2699 Other pulmonary embolism without acute cor pulmonale: Secondary | ICD-10-CM | POA: Diagnosis not present

## 2021-11-20 DIAGNOSIS — I10 Essential (primary) hypertension: Secondary | ICD-10-CM

## 2021-11-20 DIAGNOSIS — Z09 Encounter for follow-up examination after completed treatment for conditions other than malignant neoplasm: Secondary | ICD-10-CM | POA: Diagnosis not present

## 2021-11-20 DIAGNOSIS — Z170 Estrogen receptor positive status [ER+]: Secondary | ICD-10-CM

## 2021-11-20 DIAGNOSIS — R059 Cough, unspecified: Secondary | ICD-10-CM

## 2021-11-20 DIAGNOSIS — R911 Solitary pulmonary nodule: Secondary | ICD-10-CM

## 2021-11-20 DIAGNOSIS — N61 Mastitis without abscess: Secondary | ICD-10-CM

## 2021-11-20 DIAGNOSIS — C50411 Malignant neoplasm of upper-outer quadrant of right female breast: Secondary | ICD-10-CM | POA: Diagnosis not present

## 2021-11-20 DIAGNOSIS — F32A Depression, unspecified: Secondary | ICD-10-CM

## 2021-11-20 MED ORDER — AMLODIPINE BESYLATE 5 MG PO TABS: 5.0000 mg | ORAL_TABLET | Freq: Every day | ORAL | 1 refills | Status: DC

## 2021-11-20 NOTE — Progress Notes (Signed)
Established patient visit   Patient: [name]   DOB: [birth year]   61 y.o. Female  MRN: [id number] Visit Date: 11/20/2021  Chief Complaint  Patient presents with   Follow-up   Subjective    HPI  Patient presents for hospital follow-up. States site is closing. Denies worsening redness or pain. Reports slight drainage. Patient reports saw her surgeon today and will continue to monitor conservatively for now, no surgery. Patient reports continues to have cough that started suddenly. Reports a dull headache. Continues with Eliquis, reports taking 5 mg twice daily.    Discharge summary:  Admit date: 10/31/2021 Discharge date: 11/02/2021   Admitted From: Home Disposition: Home   Recommendations for Outpatient Follow-up:  Follow up with PCP in 1-2 weeks Please obtain BMP/CBC in one week Follow-up with general surgery scheduled Consider changing lisinopril to ARB if cough persists:   Home Health: Equipment/Devices:   Discharge Condition: Stable CODE STATUS: Full code Diet recommendation: Heart healthy   Brief/Interim Summary: 60y.o. female with a known history of right breast cancer with lymph node resection, status post I&D of infected seroma of the right breast and debridement on April 30 which has been packed and dressed daily presents to the emergency department for evaluation of right breast pain and swelling.  Patient has been compliant with treatment however she noticed the redness and swelling and pain have been worsening, extending towards her right axilla for the past 3 days   General surgery was consulted; patient started on doxycycline   Discharge Diagnoses:  Principal Problem:   Cellulitis of right breast   Right breast cellulitis/abscess -Seen by general surgery; no surgical intervention planned as patient is on Eliquis and also on radiotherapy -Patient seen by IR, and aspiration of fluid collection was attempted, although no fluid could be aspirated -It was  felt that patient did not have a true abscess and only has an empty cavity at this point -She will plan to follow-up with general surgery for further wound management, no antibiotics indicated at this time   Pulmonary embolism -Incidental finding of right lower lobe segmental pulmonary embolism without evidence of right heart strain -Patient started on Eliquis -Venous duplex of lower extremities is negative for DVT   Hypokalemia -Potassium is 3.1 -Replaced   History of breast cancer -Continue Femara   History of hypertension -Continue HCTZ, lisinopril, metoprolol   History of anxiety/depression -Continue Prozac   Cough -Appears to be relatively acute in onset -No prior history of tobacco use or asthma -Chest x-ray repeated prior to discharge did not show any acute findings -Started on PPI, antitussives -She has been on lisinopril for the past 5 years without any issues -Would consider changing to losartan if her cough persists as an outpatient   Discharge Instructions   Discharge Instructions       Diet - low sodium heart healthy   Complete by: As directed      Discharge wound care:   Complete by: As directed      Cleanse with NS, pat gently dry. Fill defects with iodoform gauze, top with dry gauze, ABD pads and secure with tape. Change PRN for soiling, otherwise daily    Increase activity slowly   Complete by: As directed           Medications: Outpatient Medications Prior to Visit  Medication Sig   acetaminophen (TYLENOL) 325 MG tablet Take 2 tablets (650 mg total) by mouth every 6 (six) hours as needed for mild pain (or  Fever >/= 101).   albuterol (VENTOLIN HFA) 108 (90 Base) MCG/ACT inhaler Inhale 2 puffs into the lungs every 6 (six) hours as needed for wheezing or shortness of breath.   APIXABAN (ELIQUIS) VTE STARTER PACK (10MG AND 5MG) Take as directed on package: start with two-60m tablets twice daily for 7 days. On day 8, switch to one-51mtablet twice daily.    dextromethorphan-guaiFENesin (MUCINEX DM) 30-600 MG 12hr tablet Take 1 tablet by mouth 2 (two) times daily.   FLUoxetine (PROZAC) 40 MG capsule Take 1 capsule (40 mg total) by mouth daily. (Patient taking differently: Take 40 mg by mouth every evening.)   hydrochlorothiazide (HYDRODIURIL) 50 MG tablet Take 1 tablet (50 mg total) by mouth daily. (Patient taking differently: Take 50 mg by mouth every evening.)   letrozole (FEMARA) 2.5 MG tablet Take 1 tablet (2.5 mg total) by mouth daily. (Patient taking differently: Take 2.5 mg by mouth every evening.)   metoprolol succinate (TOPROL-XL) 25 MG 24 hr tablet Take 1 tablet (25 mg total) by mouth daily. (Patient taking differently: Take 25 mg by mouth every evening.)   omeprazole (PRILOSEC OTC) 20 MG tablet Take 1 tablet (20 mg total) by mouth daily.   ondansetron (ZOFRAN-ODT) 4 MG disintegrating tablet Take 1 tablet (4 mg total) by mouth 2 (two) times daily as needed for nausea or vomiting.   [DISCONTINUED] lisinopril (ZESTRIL) 40 MG tablet Take 1 tablet (40 mg total) by mouth daily. (Patient taking differently: Take 40 mg by mouth every evening.)   No facility-administered medications prior to visit.    Review of Systems Review of Systems:  A fourteen system review of systems was performed and found to be positive as per HPI.     Objective    BP 96/66   Pulse 60   Temp 97.7 F (36.5 C)   Ht 5' 3.5" (1.613 m)   Wt 219 lb (99.3 kg)   LMP 02/07/2013   SpO2 99%   BMI 38.19 kg/m  BP Readings from Last 3 Encounters:  11/20/21 96/66  11/02/21 (!) 143/65  10/02/21 112/74   Wt Readings from Last 3 Encounters:  11/20/21 219 lb (99.3 kg)  11/01/21 223 lb 1.7 oz (101.2 kg)  10/02/21 219 lb (99.3 kg)    Physical Exam  General:  Pleasant and cooperative, appropriate for stated age.  Neuro:  Alert and oriented, extra-ocular muscles intact  HEENT:  Normocephalic, atraumatic, neck supple  Skin:  no gross rash, warm, pink. Cardiac:  RRR, S1  S2 Respiratory: CTA B/L  Vascular:  Ext warm, no cyanosis apprec.; cap RF less 2 sec. Psych:  No HI/SI, judgement and insight good, Euthymic mood. Full Affect.   No results found for any visits on 11/20/21.  Assessment & Plan      Problem List Items Addressed This Visit       Cardiovascular and Mediastinum   Essential hypertension   Relevant Medications   amLODipine (NORVASC) 5 MG tablet   Other Relevant Orders   CBC w/Diff   Comp Met (CMET)     Other   Anxiety   Malignant neoplasm of upper-outer quadrant of right breast in female, estrogen receptor positive ([place]  Cellulitis of right breast   Other Visit Diagnoses     Hospital discharge follow-up    -  Primary   Relevant Orders   CBC w/Diff   Comp Met (CMET)   Acute pulmonary embolism without acute cor pulmonale, unspecified pulmonary embolism type ([place]  Relevant Medications   amLODipine (NORVASC) 5 MG tablet   Depression, unspecified depression type       Cough, unspecified type       Pulmonary nodule, right          Hospital discharge follow-up, Cellulitis of right breast: -Reviewed hospital notes, labs and imaging. -US BIOPSY FNA w/ IMAGING: IMPRESSION: Status post attempted ultrasound-guided aspiration of right breast cavity yielded no fluid, with aspiration of only air through the communicating tract to the skin surface. -Recommend to follow surgeon recommendations and monitor for worsening symptoms. -Will attempt to collect labs (CBC, CMP), patient reports is a difficult stick.   Pulmonary embolism: -Chest CT w/ contrast revealed right lower lobe segmental pulmonary embolism, no evidence for right heart strain. Discussed with patient given her risk factors recommend to continue with anticoagulant therapy for 6 months. At that time we can reassess and decide if medication needs to be extended. Continue Eliquis 5 mg BID.  Malignant neoplasm of upper-outer quadrant of right breast in female, estrogen  receptor positive: -Followed by Oncology and G. Surgery. -On letrozole 2.5 mg daily.  Essential hypertension, Cough: -Discussed with patient soft blood pressure today. Given acute onset of cough will change blood pressure medication- Lisinopril. Imaging studies (chest CT and XR) revealed clear lungs. Discussed cough, not commonly, but can also be associated with ARB therapy so will change to CCB- amlodipine 5 mg. Recommend to monitor BP at home and if readings consistently >135/80 should notify me for medication adjustments. Discussed potential side effects and advised to let me know if unable to tolerate medication. Pt verbalized understanding. Continue HCTZ 50 mg daily and metoprolol succinate 25 mg daily. Will continue to monitor.  Right pulmonary nodule: -Chest CT w/ contrast revealed unchanged 7 mm right upper lobe nodule with recommendations to repeat CT in 6-12 months. Patient was previously referred to pulmonology, referral was closed. If cough fails to improve or nodule changes upon repeat imaging then recommend new referral.   Anxiety and depression: -Stable -Continue fluoxetine 40 mg daily.  -Will continue to monitor.   Return in about 3 months (around 02/20/2022) for HTN, mood, med management.        Lorrene Reid, PA-C  [facility] [phone] (phone) [phone] (fax)  [place]

## 2021-11-21 LAB — CBC WITH DIFFERENTIAL/PLATELET
Basophils Absolute: 0.1 10*3/uL (ref 0.0–0.2)
Basos: 1 %
EOS (ABSOLUTE): 0.1 10*3/uL (ref 0.0–0.4)
Eos: 1 %
Hematocrit: 41.3 % (ref 34.0–46.6)
Hemoglobin: 13.6 g/dL (ref 11.1–15.9)
Immature Grans (Abs): 0.1 10*3/uL (ref 0.0–0.1)
Immature Granulocytes: 1 %
Lymphocytes Absolute: 2.1 10*3/uL (ref 0.7–3.1)
Lymphs: 18 %
MCH: 29.8 pg (ref 26.6–33.0)
MCHC: 32.9 g/dL (ref 31.5–35.7)
MCV: 91 fL (ref 79–97)
Monocytes Absolute: 1 10*3/uL — ABNORMAL HIGH (ref 0.1–0.9)
Monocytes: 9 %
Neutrophils Absolute: 8.2 10*3/uL — ABNORMAL HIGH (ref 1.4–7.0)
Neutrophils: 70 %
Platelets: 378 10*3/uL (ref 150–450)
RBC: 4.56 x10E6/uL (ref 3.77–5.28)
RDW: 14 % (ref 11.7–15.4)
WBC: 11.5 10*3/uL — ABNORMAL HIGH (ref 3.4–10.8)

## 2021-11-21 LAB — COMPREHENSIVE METABOLIC PANEL
ALT: 21 IU/L (ref 0–32)
AST: 22 IU/L (ref 0–40)
Albumin/Globulin Ratio: 1.5 (ref 1.2–2.2)
Albumin: 4.4 g/dL (ref 3.8–4.9)
Alkaline Phosphatase: 105 IU/L (ref 44–121)
BUN/Creatinine Ratio: 16 (ref 9–23)
BUN: 20 mg/dL (ref 6–24)
Bilirubin Total: 0.3 mg/dL (ref 0.0–1.2)
CO2: 22 mmol/L (ref 20–29)
Calcium: 10.2 mg/dL (ref 8.7–10.2)
Chloride: 98 mmol/L (ref 96–106)
Creatinine, Ser: 1.24 mg/dL — ABNORMAL HIGH (ref 0.57–1.00)
Globulin, Total: 2.9 g/dL (ref 1.5–4.5)
Glucose: 90 mg/dL (ref 70–99)
Potassium: 3.9 mmol/L (ref 3.5–5.2)
Sodium: 141 mmol/L (ref 134–144)
Total Protein: 7.3 g/dL (ref 6.0–8.5)
eGFR: 50 mL/min/{1.73_m2} — ABNORMAL LOW (ref >59)

## 2021-11-26 ENCOUNTER — Other Ambulatory Visit: Payer: Medicaid Other

## 2021-11-26 ENCOUNTER — Telehealth: Payer: Self-pay | Admitting: Physician Assistant

## 2021-11-26 DIAGNOSIS — N289 Disorder of kidney and ureter, unspecified: Secondary | ICD-10-CM

## 2021-11-26 DIAGNOSIS — R7989 Other specified abnormal findings of blood chemistry: Secondary | ICD-10-CM

## 2021-11-26 NOTE — Telephone Encounter (Signed)
Patient states her morning BPs have been high-150s/80s-90s. She was recently put on new BP meds and is currently keeping track of at home BPs but states she is waking up with headaches in the morning. AS, CMA

## 2021-11-27 ENCOUNTER — Other Ambulatory Visit: Payer: Self-pay | Admitting: Physician Assistant

## 2021-11-27 DIAGNOSIS — I10 Essential (primary) hypertension: Secondary | ICD-10-CM

## 2021-11-27 LAB — COMPREHENSIVE METABOLIC PANEL
ALT: 22 IU/L (ref 0–32)
AST: 20 IU/L (ref 0–40)
Albumin/Globulin Ratio: 1.5 (ref 1.2–2.2)
Albumin: 4.3 g/dL (ref 3.8–4.9)
Alkaline Phosphatase: 105 IU/L (ref 44–121)
BUN/Creatinine Ratio: 13 (ref 9–23)
BUN: 18 mg/dL (ref 6–24)
Bilirubin Total: 0.3 mg/dL (ref 0.0–1.2)
CO2: 25 mmol/L (ref 20–29)
Calcium: 10.3 mg/dL — ABNORMAL HIGH (ref 8.7–10.2)
Chloride: 97 mmol/L (ref 96–106)
Creatinine, Ser: 1.36 mg/dL — ABNORMAL HIGH (ref 0.57–1.00)
Globulin, Total: 2.8 g/dL (ref 1.5–4.5)
Glucose: 124 mg/dL — ABNORMAL HIGH (ref 70–99)
Potassium: 4.2 mmol/L (ref 3.5–5.2)
Sodium: 139 mmol/L (ref 134–144)
Total Protein: 7.1 g/dL (ref 6.0–8.5)
eGFR: 45 mL/min/{1.73_m2} — ABNORMAL LOW (ref >59)

## 2021-11-27 LAB — CBC
Hematocrit: 39.9 % (ref 34.0–46.6)
Hemoglobin: 13.2 g/dL (ref 11.1–15.9)
MCH: 29.7 pg (ref 26.6–33.0)
MCHC: 33.1 g/dL (ref 31.5–35.7)
MCV: 90 fL (ref 79–97)
Platelets: 361 10*3/uL (ref 150–450)
RBC: 4.45 x10E6/uL (ref 3.77–5.28)
RDW: 14.2 % (ref 11.7–15.4)
WBC: 9.6 10*3/uL (ref 3.4–10.8)

## 2021-11-27 MED ORDER — CLONIDINE HCL 0.1 MG PO TABS: 0.1000 mg | ORAL_TABLET | Freq: Two times a day (BID) | ORAL | 0 refills | Status: DC

## 2021-11-27 NOTE — Telephone Encounter (Signed)
Patient is aware of the results and verbalized understanding. AS, CMA 

## 2021-11-27 NOTE — Telephone Encounter (Signed)
LMTCB. AS, CMA

## 2021-11-28 ENCOUNTER — Telehealth: Payer: Self-pay | Admitting: Adult Health

## 2021-11-28 ENCOUNTER — Encounter: Payer: Self-pay | Admitting: Adult Health

## 2021-11-28 ENCOUNTER — Other Ambulatory Visit: Payer: Self-pay

## 2021-11-28 ENCOUNTER — Inpatient Hospital Stay: Payer: Medicare Other | Attending: Hematology and Oncology | Admitting: Adult Health

## 2021-11-28 ENCOUNTER — Other Ambulatory Visit (INDEPENDENT_AMBULATORY_CARE_PROVIDER_SITE_OTHER): Payer: Medicare Other

## 2021-11-28 VITALS — BP 126/78 | HR 93 | Temp 97.5°F | Resp 18 | Ht 63.5 in | Wt 217.6 lb

## 2021-11-28 DIAGNOSIS — Z79899 Other long term (current) drug therapy: Secondary | ICD-10-CM | POA: Insufficient documentation

## 2021-11-28 DIAGNOSIS — Z803 Family history of malignant neoplasm of breast: Secondary | ICD-10-CM | POA: Insufficient documentation

## 2021-11-28 DIAGNOSIS — Z7901 Long term (current) use of anticoagulants: Secondary | ICD-10-CM | POA: Diagnosis not present

## 2021-11-28 DIAGNOSIS — N289 Disorder of kidney and ureter, unspecified: Secondary | ICD-10-CM

## 2021-11-28 DIAGNOSIS — I10 Essential (primary) hypertension: Secondary | ICD-10-CM | POA: Insufficient documentation

## 2021-11-28 DIAGNOSIS — M129 Arthropathy, unspecified: Secondary | ICD-10-CM | POA: Insufficient documentation

## 2021-11-28 DIAGNOSIS — R519 Headache, unspecified: Secondary | ICD-10-CM | POA: Insufficient documentation

## 2021-11-28 DIAGNOSIS — R232 Flushing: Secondary | ICD-10-CM | POA: Insufficient documentation

## 2021-11-28 DIAGNOSIS — M797 Fibromyalgia: Secondary | ICD-10-CM | POA: Insufficient documentation

## 2021-11-28 DIAGNOSIS — Z923 Personal history of irradiation: Secondary | ICD-10-CM | POA: Insufficient documentation

## 2021-11-28 DIAGNOSIS — E785 Hyperlipidemia, unspecified: Secondary | ICD-10-CM | POA: Diagnosis not present

## 2021-11-28 DIAGNOSIS — Z801 Family history of malignant neoplasm of trachea, bronchus and lung: Secondary | ICD-10-CM | POA: Diagnosis not present

## 2021-11-28 DIAGNOSIS — Z79811 Long term (current) use of aromatase inhibitors: Secondary | ICD-10-CM | POA: Insufficient documentation

## 2021-11-28 DIAGNOSIS — Z170 Estrogen receptor positive status [ER+]: Secondary | ICD-10-CM | POA: Insufficient documentation

## 2021-11-28 DIAGNOSIS — C50411 Malignant neoplasm of upper-outer quadrant of right female breast: Secondary | ICD-10-CM | POA: Diagnosis present

## 2021-11-28 LAB — POCT URINALYSIS DIPSTICK
Bilirubin, UA: NEGATIVE
Blood, UA: NEGATIVE
Glucose, UA: NEGATIVE
Ketones, UA: NEGATIVE
Leukocytes, UA: NEGATIVE
Nitrite, UA: NEGATIVE
Protein, UA: NEGATIVE
Spec Grav, UA: 1.02 (ref 1.010–1.025)
Urobilinogen, UA: 0.2 E.U./dL
pH, UA: 6 (ref 5.0–8.0)

## 2021-11-28 NOTE — Telephone Encounter (Signed)
Scheduled appointment per 7/20 los. Patient is aware.

## 2021-11-28 NOTE — Addendum Note (Signed)
Addended by: Mickel Crow on: 11/28/2021 02:38 PM   Modules accepted: Orders

## 2021-11-28 NOTE — Progress Notes (Signed)
SURVIVORSHIP VISIT:   BRIEF ONCOLOGIC HISTORY:  Oncology History  Malignant neoplasm of upper-outer quadrant of right breast in female, estrogen receptor positive ([place])  03/18/2021 Initial Diagnosis   Screening mammogram: a possible mass and distortion in the right breast, Diagnostic mammogram: highly suspicious 0.9 cm upper outer right breast mass. Biopsy: Grade 1 IDC with DCIS ER 100%, PR 1%, HER2 negative by [facility]   03/27/2021 Cancer Staging   Staging form: Breast, AJCC 8th Edition - Clinical stage from 03/27/2021: Stage IA (cT1b, cN0, cM0, G1, ER+, PR+, HER2-) - Signed by Nicholas Lose, MD on 03/27/2021 Stage prefix: Initial diagnosis Histologic grading system: 3 grade system    Genetic Testing   [facility] CustomNext Panel (47 genes) was Negative. Report date is 04/09/2021.  The CustomNext-Cancer+RNAinsight panel offered by [facility] includes sequencing and rearrangement analysis for the following 47 genes:  APC, ATM, AXIN2, BARD1, BMPR1A, BRCA1, BRCA2, BRIP1, CDH1, CDK4, CDKN2A, CHEK2, CTNNA1, DICER1, EPCAM, GREM1, HOXB13, KIT, MEN1, MLH1, MSH2, MSH3, MSH6, MUTYH, NBN, NF1, NTHL1, PALB2, PDGFRA, PMS2, POLD1, POLE, PTEN, RAD50, RAD51C, RAD51D, SDHA, SDHB, SDHC, SDHD, SMAD4, SMARCA4, STK11, TP53, TSC1, TSC2, and VHL.  RNA data is routinely analyzed for use in variant interpretation for all genes.   06/03/2021 Surgery   Right lumpectomy: IDC with DCIS grade 1, 1.7 cm, margins negative focal ALH, 0/2 lymph nodes negative, ER 100%, PR 100%, HER2 negative, Ki-67 5%   06/12/2021 Oncotype testing   Oncotype DX score 7: Distant recurrence at 9 years: 3%   07/10/2021 - 08/08/2021 Radiation Therapy   Site Technique Total Dose (Gy) Dose per Fx (Gy) Completed Fx Beam Energies  Breast, Right: Breast_R 3D 42.56/42.56 2.66 16/16 10X  Breast, Right: Breast_R_Bst 3D 8/8 2 4/4 10X     09/09/2021 -  Anti-estrogen oral therapy   Letrozole     INTERVAL HISTORY:  Ms. Peltz to review her survivorship care  plan detailing her treatment course for breast cancer, as well as monitoring long-term side effects of that treatment, education regarding health maintenance, screening, and overall wellness and health promotion.     Overall, Ms. Sloss reports feeling quite well.  She is taking letrozole daily and is reporting increased hot flashes.  She has been seeing her PCP recently because her kidney function has increased.  Her blood pressure medications have changed and she has developed subsequent headaches.  HER bp is 126/78 today.    REVIEW OF SYSTEMS:  Review of Systems  Constitutional:  Negative for appetite change, chills, fatigue, fever and unexpected weight change.  HENT:   Negative for hearing loss, lump/mass and trouble swallowing.   Eyes:  Negative for eye problems and icterus.  Respiratory:  Negative for chest tightness, cough and shortness of breath.   Cardiovascular:  Negative for chest pain, leg swelling and palpitations.  Gastrointestinal:  Negative for abdominal distention, abdominal pain, constipation, diarrhea, nausea and vomiting.  Endocrine: Positive for hot flashes.  Genitourinary:  Negative for difficulty urinating.   Musculoskeletal:  Negative for arthralgias and gait problem.  Skin:  Negative for itching and rash.  Neurological:  Positive for headaches. Negative for dizziness, extremity weakness, gait problem, light-headedness, numbness, seizures and speech difficulty.  Hematological:  Negative for adenopathy. Does not bruise/bleed easily.  Psychiatric/Behavioral:  Negative for depression. The patient is not nervous/anxious.    Breast: Denies any new nodularity, masses, tenderness, nipple changes, or nipple discharge.      ONCOLOGY TREATMENT TEAM:  1. Surgeon:  Dr. Marlou Starks at [facility]  2. Medical Oncologist: Dr. Lindi Adie  3. Radiation Oncologist: Dr. Lisbeth Renshaw    PAST MEDICAL/SURGICAL HISTORY:  Past Medical History:  Diagnosis Date   Acid reflux    takes Zantac  and Omeprazole daily   Anemia    Anxiety    takes Citaopram daily   Arthritis    right knee   Arthrosis    left thumb C[facility]   Chronic back pain    DDD   Clotting disorder (HCC)    hx of blood clot following knee scope   Depression    Fibromyalgia    History of bronchitis 3+yrs ago   Hyperlipidemia    takes Atorvastatin daily   Hypertension    takes Lisinopril and HCTZ daily   Insomnia    takes Elavil nightly as needed   Paraesophageal hernia    Past Surgical History:  Procedure Laterality Date   BREAST BIOPSY Left    BREAST LUMPECTOMY WITH RADIOACTIVE SEED AND SENTINEL LYMPH NODE BIOPSY Right 06/03/2021   Procedure: RIGHT BREAST LUMPECTOMY WITH RADIOACTIVE SEED AND SENTINEL LYMPH NODE BIOPSY;  Surgeon: Jovita Kussmaul, MD;  Location: [place];  Service: General;  Laterality: Right;   BUNIONECTOMY Right 02/18/2018   Procedure: Yehuda Budd;  Surgeon: Edrick Kins, DPM;  Location: [place];  Service: Podiatry;  Laterality: Right;   CAPSULOTOMY Bilateral 02/18/2018   Procedure: CAPSULOTOMY MPJ RELEASE JOINT 2N BILATERAL;  Surgeon: Edrick Kins, DPM;  Location: [place];  Service: Podiatry;  Laterality: Bilateral;   CARPOMETACARPEL SUSPENSION PLASTY Left 01/27/2018   Procedure: LEFT THUMB ligament reconstruction and tendon interposition;  Surgeon: Leandrew Koyanagi, MD;  Location: [place];  Service: Orthopedics;  Laterality: Left;   CARPOMETACARPEL SUSPENSION PLASTY Left 03/07/2020   Procedure: REVISION LEFT THUMB CARPOMETACARPAL ([place]) ARTHROPLASTY;  Surgeon: Leandrew Koyanagi, MD;  Location: [place];  Service: Orthopedics;  Laterality: Left;   CHONDROPLASTY Right 06/28/2014   Procedure: CHONDROPLASTY;  Surgeon: Marianna Payment, MD;  Location: [place];  Service: Orthopedics;  Laterality: Right;   COLONOSCOPY N/A 05/29/2014   Procedure: COLONOSCOPY;  Surgeon: Daneil Dolin, MD;  [facility];  Service:  Endoscopy;  Laterality: N/A;  215pm- Pt is working until 12:00 so she can't come any earlier   ESOPHAGOGASTRODUODENOSCOPY N/A 05/29/2014   Procedure: ESOPHAGOGASTRODUODENOSCOPY (EGD);  Surgeon: Daneil Dolin, MD;  [facility];  Service: Endoscopy;  Laterality: N/A;   ESOPHAGOGASTRODUODENOSCOPY N/A 12/17/2020   Procedure: ESOPHAGOGASTRODUODENOSCOPY (EGD);  Surgeon: Lajuana Matte, MD;  Location: [place];  Service: Thoracic;  Laterality: N/A;   GANGLION CYST EXCISION Left 01/13/2002   HAMMER TOE SURGERY Bilateral 02/18/2018   Procedure: HAMMER TOE CORRECTION2ND BILATERAL;  Surgeon: Edrick Kins, DPM;  Location: [place];  Service: Podiatry;  Laterality: Bilateral;   HERNIA REPAIR     IRRIGATION AND DEBRIDEMENT ABSCESS Right 09/08/2021   Procedure: IRRIGATION AND DEBRIDEMENT RIGHT BREAST ABSCESS;  Surgeon: Donnie Mesa, MD;  Location: [facility];  Service: General;  Laterality: Right;   KNEE ARTHROSCOPY WITH MEDIAL MENISECTOMY Right 06/28/2014   Procedure: RIGHT KNEE ARTHROSCOPY WITH PARTIAL MEDIAL MENISCECTOMY AND CHONDROPLASTY;  Surgeon: Marianna Payment, MD;  Location: [place];  Service: Orthopedics;  Laterality: Right;   MALONEY DILATION N/A 05/29/2014   Procedure: Venia Minks DILATION;  Surgeon: Daneil Dolin, MD;  [facility];  Service: Endoscopy;  Laterality: N/A;   PARTIAL KNEE ARTHROPLASTY Right 09/15/2014   Procedure:  RIGHT UNICOMPARTMENTAL KNEE ARTHROPLASTY;  Surgeon: Leandrew Koyanagi, MD;  Location: [place];  Service: Orthopedics;  Laterality: Right;   SHOULDER ARTHROSCOPY Right    TOTAL KNEE ARTHROPLASTY Left 04/08/2004   XI ROBOTIC ASSISTED PARAESOPHAGEAL HERNIA REPAIR N/A 12/17/2020   Procedure: XI ROBOTIC ASSISTED Laparoscopy PARAESOPHAGEAL HERNIA REPAIR WITH FUNDOPLICATION;  Surgeon: Lajuana Matte, MD;  Location: [facility];  Service: Thoracic;  Laterality: N/A;     ALLERGIES:  Allergies  Allergen Reactions   Tramadol Itching    "bugs  crawling all over"     CURRENT MEDICATIONS:  Outpatient Encounter Medications as of 11/28/2021  Medication Sig   acetaminophen (TYLENOL) 325 MG tablet Take 2 tablets (650 mg total) by mouth every 6 (six) hours as needed for mild pain (or Fever >/= 101).   albuterol (VENTOLIN HFA) 108 (90 Base) MCG/ACT inhaler Inhale 2 puffs into the lungs every 6 (six) hours as needed for wheezing or shortness of breath.   amLODipine (NORVASC) 5 MG tablet Take 1 tablet (5 mg total) by mouth daily. (Patient taking differently: Take 10 mg by mouth daily.)   apixaban (ELIQUIS) 5 MG TABS tablet Take 5 mg by mouth 2 (two) times daily.   cloNIDine (CATAPRES) 0.1 MG tablet Take 1 tablet (0.1 mg total) by mouth 2 (two) times daily. (Patient taking differently: Take 0.1 mg by mouth daily.)   FLUoxetine (PROZAC) 40 MG capsule Take 1 capsule (40 mg total) by mouth daily. (Patient taking differently: Take 40 mg by mouth every evening.)   letrozole (FEMARA) 2.5 MG tablet Take 1 tablet (2.5 mg total) by mouth daily. (Patient taking differently: Take 2.5 mg by mouth every evening.)   omeprazole (PRILOSEC OTC) 20 MG tablet Take 1 tablet (20 mg total) by mouth daily.   dextromethorphan-guaiFENesin (MUCINEX DM) 30-600 MG 12hr tablet Take 1 tablet by mouth 2 (two) times daily. (Patient not taking: Reported on 11/28/2021)   ondansetron (ZOFRAN-ODT) 4 MG disintegrating tablet Take 1 tablet (4 mg total) by mouth 2 (two) times daily as needed for nausea or vomiting. (Patient not taking: Reported on 11/28/2021)   [DISCONTINUED] APIXABAN (ELIQUIS) VTE STARTER PACK (10MG AND 5MG) Take as directed on package: start with two-66m tablets twice daily for 7 days. On day 8, switch to one-596mtablet twice daily. (Patient not taking: Reported on 11/28/2021)   [DISCONTINUED] hydrochlorothiazide (HYDRODIURIL) 50 MG tablet Take 1 tablet (50 mg total) by mouth daily. (Patient not taking: Reported on 11/28/2021)   [DISCONTINUED] metoprolol succinate  (TOPROL-XL) 25 MG 24 hr tablet Take 1 tablet (25 mg total) by mouth daily. (Patient not taking: Reported on 11/28/2021)   No facility-administered encounter medications on file as of 11/28/2021.     ONCOLOGIC FAMILY HISTORY:  Family History  Problem Relation Age of Onset   Stroke Mother    Lung cancer Father [phone] GI problems Father    Prostate cancer Father    Cancer Sister        breast   Breast cancer Sister [phone] Lung cancer Maternal Grandfather    Lung cancer Paternal Grandfather    Diabetes Son    Colon cancer Neg Hx    Esophageal cancer Neg Hx    Pancreatic cancer Neg Hx       SOCIAL HISTORY:  Social History   Socioeconomic History   Marital status: Divorced    Spouse name: Not on file   Number of children: 5   Years of education: Not on file  Highest education level: Not on file  Occupational History   Occupation: Scientist, water quality  Tobacco Use   Smoking status: Never   Smokeless tobacco: Never  Vaping Use   Vaping Use: Never used  Substance and Sexual Activity   Alcohol use: No    Alcohol/week: 0.0 standard drinks of alcohol   Drug use: No   Sexual activity: Not Currently    Birth control/protection: None, Post-menopausal  Other Topics Concern   Not on file  Social History Narrative   Right Handed    Lives in a one story home    Social Determinants of Health   Financial Resource Strain: Not on file  Food Insecurity: Not on file  Transportation Needs: Not on file  Physical Activity: Sufficiently Active (07/16/2017)   Exercise Vital Sign    Days of Exercise per Week: 7 days    Minutes of Exercise per Session: 60 min  Stress: Stress Concern Present (07/16/2017)   Ray    Feeling of Stress : Rather much  Social Connections: Moderately Isolated (07/16/2017)   Social Connection and Isolation Panel [NHANES]    Frequency of Communication with Friends and Family: More than three times a week     Frequency of Social Gatherings with Friends and Family: Once a week    Attends Religious Services: Never    Marine scientist or Organizations: No    Attends Archivist Meetings: Never    Marital Status: Divorced  Human resources officer Violence: Not At Risk (07/16/2017)   Humiliation, Afraid, Rape, and Kick questionnaire    Fear of Current or Ex-Partner: No    Emotionally Abused: No    Physically Abused: No    Sexually Abused: No     OBSERVATIONS/OBJECTIVE:  BP 126/78 (BP Location: Left Arm, Patient Position: Sitting)   Pulse 93   Temp (!) 97.5 F (36.4 C) (Tympanic)   Resp 18   Ht 5' 3.5" (1.613 m)   Wt 217 lb 9.6 oz (98.7 kg)   LMP 02/07/2013   SpO2 97%   BMI 37.94 kg/m  GENERAL: Patient is a well appearing female in no acute distress HEENT:  Sclerae anicteric.  Oropharynx clear and moist. No ulcerations or evidence of oropharyngeal candidiasis. Neck is supple.  NODES:  No cervical, supraclavicular, or axillary lymphadenopathy palpated.  BREAST EXAM:  right breast s/p lumpectomy and radiation, no sign of local recurrence, left breast benign LUNGS:  Clear to auscultation bilaterally.  No wheezes or rhonchi. HEART:  Regular rate and rhythm. No murmur appreciated. ABDOMEN:  Soft, nontender.  Positive, normoactive bowel sounds. No organomegaly palpated. MSK:  No focal spinal tenderness to palpation. Full range of motion bilaterally in the upper extremities. EXTREMITIES:  No peripheral edema.   SKIN:  Clear with no obvious rashes or skin changes. No nail dyscrasia. NEURO:  Nonfocal. Well oriented.  Appropriate affect.   LABORATORY DATA:  None for this visit.  DIAGNOSTIC IMAGING:  None for this visit.    ASSESSMENT AND PLAN:  Ms.. Dalporto is a pleasant 61 y.o. female with Stage IA right breast invasive ductal carcinoma, ER+/PR+/HER2-, diagnosed in 03/2021, treated with lumpectomy, adjuvant radiation therapy, and anti-estrogen therapy with Letrozole beginning in 09/2021.   She presents to the Survivorship Clinic for our initial meeting and routine follow-up post-completion of treatment for breast cancer.    1. Stage IA right/left breast cancer:  Ms. Roseman is continuing to recover from definitive treatment for breast cancer.  She will follow-up with her medical oncologist, Dr. Lindi Adie in 6 months with history and physical exam per surveillance protocol.  She will continue her anti-estrogen therapy with Letrozole. Thus far, she is tolerating the Letrozole well, with minimal side effects. She was instructed to make Dr. Lindi Adie or myself aware if she begins to experience any worsening side effects of the medication and I could see her back in clinic to help manage those side effects, as needed. Her mammogram is due 03/2022; orders placed today.   Today, a comprehensive survivorship care plan and treatment summary was reviewed with the patient today detailing her breast cancer diagnosis, treatment course, potential late/long-term effects of treatment, appropriate follow-up care with recommendations for the future, and patient education resources.  A copy of this summary, along with a letter will be sent to the patient's primary care provider via mail/fax/In Basket message after today's visit.    2. Headaches: This could be related to blood pressure medication changes.  She is not having any weakness, numbness, gait issues, vision changes related to these.  I recommended tylenol and to call us next week if they are not improving as she adjusts to the new medications and I will order MRI brain w wo contrast.   3. Bone health:  Given Ms. Baldinger's age/history of breast cancer and her current treatment regimen including anti-estrogen therapy with Letrozole, she is at risk for bone demineralization.  Buffy has not undergone bone density testing and I placed orders for this to be completed at [facility] today.  She was given education on specific activities to promote bone  health.  3. Cancer screening:  Due to Ms. Wurzel's history and her age, she should receive screening for skin cancers, colon cancer, and gynecologic cancers.  The information and recommendations are listed on the patient's comprehensive care plan/treatment summary and were reviewed in detail with the patient.    4. Health maintenance and wellness promotion: Ms. Shelton was encouraged to consume 5-7 servings of fruits and vegetables per day. We reviewed the "Nutrition Rainbow" handout.  She was also encouraged to engage in moderate to vigorous exercise for 30 minutes per day most days of the week. We discussed the [employer] fitness program, which is designed for cancer survivors to help them become more physically fit after cancer treatments.  She was instructed to limit her alcohol consumption and continue to abstain from tobacco use.     5. Support services/counseling: It is not uncommon for this period of the patient's cancer care trajectory to be one of many emotions and stressors. She was given information regarding our available services and encouraged to contact me with any questions or for help enrolling in any of our support group/programs.    Follow up instructions:    -Return to cancer center 04/2022 for f/u with Dr. Lindi Adie  -Mammogram due in 03/2022 -Follow up with surgery 12/2021 -She is welcome to return back to the Survivorship Clinic at any time; no additional follow-up needed at this time.  -Consider referral back to survivorship as a long-term survivor for continued surveillance  The patient was provided an opportunity to ask questions and all were answered. The patient agreed with the plan and demonstrated an understanding of the instructions.   Total encounter time:40 minutes*in face-to-face visit time, chart review, lab review, care coordination, order entry, and documentation of the encounter time.    Wilber Bihari, NP 11/28/21 11:19 AM Medical Oncology and  Hematology [facility] [place], [place] [address] Tel. [phone]    Fax. [phone]  *Total Encounter Time as defined by the Centers for Medicare and Medicaid Services includes, in addition to the face-to-face time of a patient visit (documented in the note above) non-face-to-face time: obtaining and reviewing outside history, ordering and reviewing medications, tests or procedures, care coordination (communications with other health care professionals or caregivers) and documentation in the medical record.

## 2021-11-30 LAB — URINE CULTURE

## 2021-12-02 ENCOUNTER — Other Ambulatory Visit: Payer: Medicaid Other

## 2021-12-02 DIAGNOSIS — R748 Abnormal levels of other serum enzymes: Secondary | ICD-10-CM

## 2021-12-02 DIAGNOSIS — I2699 Other pulmonary embolism without acute cor pulmonale: Secondary | ICD-10-CM

## 2021-12-02 MED ORDER — APIXABAN 5 MG PO TABS: 5.0000 mg | ORAL_TABLET | Freq: Two times a day (BID) | ORAL | 2 refills | Status: DC

## 2021-12-02 NOTE — Addendum Note (Signed)
Addended by: Mickel Crow on: 12/02/2021 02:55 PM   Modules accepted: Orders

## 2021-12-03 LAB — HEPATIC FUNCTION PANEL
ALT: 20 IU/L (ref 0–32)
AST: 18 IU/L (ref 0–40)
Albumin: 4.1 g/dL (ref 3.8–4.9)
Alkaline Phosphatase: 85 IU/L (ref 44–121)
Bilirubin Total: 0.2 mg/dL (ref 0.0–1.2)
Bilirubin, Direct: <0.1 mg/dL (ref 0.00–0.40)
Total Protein: 6.6 g/dL (ref 6.0–8.5)

## 2021-12-04 ENCOUNTER — Telehealth: Payer: Self-pay | Admitting: Physician Assistant

## 2021-12-04 NOTE — Telephone Encounter (Signed)
Called pt she is advised of results

## 2021-12-04 NOTE — Telephone Encounter (Signed)
Patient called and wanted a call back about her lab work and possibly her kidney levels. AMUCK

## 2021-12-04 NOTE — Telephone Encounter (Signed)
Patient called back and left a voicemail stating she has a missed call and would like another call back regarding this. AMUCK

## 2021-12-20 ENCOUNTER — Other Ambulatory Visit: Payer: Self-pay | Admitting: Physician Assistant

## 2021-12-20 DIAGNOSIS — F419 Anxiety disorder, unspecified: Secondary | ICD-10-CM

## 2021-12-20 DIAGNOSIS — F32A Depression, unspecified: Secondary | ICD-10-CM

## 2021-12-23 ENCOUNTER — Other Ambulatory Visit: Payer: Medicaid Other

## 2021-12-23 DIAGNOSIS — N289 Disorder of kidney and ureter, unspecified: Secondary | ICD-10-CM

## 2021-12-25 LAB — SPECIMEN STATUS REPORT

## 2021-12-25 LAB — RENAL FUNCTION PANEL
Albumin: 3.9 g/dL (ref 3.8–4.9)
Albumin: 4.2 g/dL (ref 3.8–4.9)
BUN/Creatinine Ratio: 21 (ref 9–23)
BUN/Creatinine Ratio: 14 (ref 9–23)
BUN: 23 mg/dL (ref 6–24)
BUN: 16 mg/dL (ref 6–24)
CO2: 20 mmol/L (ref 20–29)
CO2: 20 mmol/L (ref 20–29)
Calcium: 9.9 mg/dL (ref 8.7–10.2)
Calcium: 10 mg/dL (ref 8.7–10.2)
Chloride: 102 mmol/L (ref 96–106)
Chloride: 104 mmol/L (ref 96–106)
Creatinine, Ser: 1.1 mg/dL — ABNORMAL HIGH (ref 0.57–1.00)
Creatinine, Ser: 1.13 mg/dL — ABNORMAL HIGH (ref 0.57–1.00)
Glucose: 110 mg/dL — ABNORMAL HIGH (ref 70–99)
Glucose: 106 mg/dL — ABNORMAL HIGH (ref 70–99)
Phosphorus: 3 mg/dL (ref 3.0–4.3)
Phosphorus: 4.4 mg/dL — ABNORMAL HIGH (ref 3.0–4.3)
Potassium: 4 mmol/L (ref 3.5–5.2)
Potassium: 4 mmol/L (ref 3.5–5.2)
Sodium: 140 mmol/L (ref 134–144)
Sodium: 142 mmol/L (ref 134–144)
eGFR: 58 mL/min/{1.73_m2} — ABNORMAL LOW (ref >59)
eGFR: 56 mL/min/{1.73_m2} — ABNORMAL LOW (ref >59)

## 2022-01-03 ENCOUNTER — Telehealth: Payer: Self-pay | Admitting: Physician Assistant

## 2022-01-03 NOTE — Telephone Encounter (Signed)
Patient called regarding bp

## 2022-01-03 NOTE — Telephone Encounter (Signed)
Per patient BP has been consistently above 130/90. Per [name] patient to add clonidine 0.'1mg'$  1 po qd with other bp meds. AS, CMA

## 2022-01-14 ENCOUNTER — Ambulatory Visit (HOSPITAL_BASED_OUTPATIENT_CLINIC_OR_DEPARTMENT_OTHER)
Admission: RE | Admit: 2022-01-14 | Discharge: 2022-01-14 | Disposition: A | Discharge status: Home / Self Care | Payer: Medicare Other | Source: Ambulatory Visit | Attending: Adult Health | Admitting: Adult Health

## 2022-01-14 ENCOUNTER — Telehealth: Payer: Self-pay

## 2022-01-14 DIAGNOSIS — Z780 Asymptomatic menopausal state: Secondary | ICD-10-CM | POA: Diagnosis not present

## 2022-01-14 DIAGNOSIS — C50411 Malignant neoplasm of upper-outer quadrant of right female breast: Secondary | ICD-10-CM | POA: Insufficient documentation

## 2022-01-14 DIAGNOSIS — Z170 Estrogen receptor positive status [ER+]: Secondary | ICD-10-CM | POA: Insufficient documentation

## 2022-01-14 DIAGNOSIS — Z853 Personal history of malignant neoplasm of breast: Secondary | ICD-10-CM | POA: Insufficient documentation

## 2022-01-14 DIAGNOSIS — M069 Rheumatoid arthritis, unspecified: Secondary | ICD-10-CM | POA: Diagnosis not present

## 2022-01-14 DIAGNOSIS — M85851 Other specified disorders of bone density and structure, right thigh: Secondary | ICD-10-CM | POA: Insufficient documentation

## 2022-01-14 DIAGNOSIS — Z13820 Encounter for screening for osteoporosis: Secondary | ICD-10-CM | POA: Diagnosis not present

## 2022-01-14 DIAGNOSIS — E2839 Other primary ovarian failure: Secondary | ICD-10-CM | POA: Insufficient documentation

## 2022-01-14 NOTE — Telephone Encounter (Signed)
-----   Message from Gardenia Phlegm, NP sent at 01/14/2022  1:10 PM EDT ----- Mild osteopenia, recommend calcium, vitamin d weight bearing exercise and repeat in 2 years ----- Message ----- From: Interface, Rad Results In Sent: 01/14/2022  11:39 AM EDT To: Gardenia Phlegm, NP

## 2022-01-14 NOTE — Telephone Encounter (Signed)
Attempted to call pt to give results per NP. No answer. LVM for call back.

## 2022-01-15 ENCOUNTER — Ambulatory Visit (INDEPENDENT_AMBULATORY_CARE_PROVIDER_SITE_OTHER): Payer: Medicare Other

## 2022-01-15 ENCOUNTER — Telehealth: Payer: Self-pay | Admitting: Physician Assistant

## 2022-01-15 ENCOUNTER — Encounter: Payer: Self-pay | Admitting: Orthopaedic Surgery

## 2022-01-15 ENCOUNTER — Ambulatory Visit (INDEPENDENT_AMBULATORY_CARE_PROVIDER_SITE_OTHER): Payer: Medicare Other | Admitting: Orthopaedic Surgery

## 2022-01-15 DIAGNOSIS — I10 Essential (primary) hypertension: Secondary | ICD-10-CM

## 2022-01-15 DIAGNOSIS — G8929 Other chronic pain: Secondary | ICD-10-CM

## 2022-01-15 DIAGNOSIS — M545 Low back pain, unspecified: Secondary | ICD-10-CM

## 2022-01-15 DIAGNOSIS — M25561 Pain in right knee: Secondary | ICD-10-CM | POA: Diagnosis not present

## 2022-01-15 MED ORDER — CLONIDINE HCL 0.1 MG PO TABS: 0.1000 mg | ORAL_TABLET | Freq: Two times a day (BID) | ORAL | 0 refills | Status: DC

## 2022-01-15 MED ORDER — LIDOCAINE HCL 1 % IJ SOLN
2.0000 mL | INTRAMUSCULAR | Status: AC | PRN
  Administered 2022-01-15: 2 mL

## 2022-01-15 MED ORDER — METHYLPREDNISOLONE ACETATE 40 MG/ML IJ SUSP
40.0000 mg | INTRAMUSCULAR | Status: AC | PRN
  Administered 2022-01-15: 40 mg via INTRA_ARTICULAR

## 2022-01-15 MED ORDER — BUPIVACAINE HCL 0.5 % IJ SOLN
2.0000 mL | INTRAMUSCULAR | Status: AC | PRN
  Administered 2022-01-15: 2 mL via INTRA_ARTICULAR

## 2022-01-15 NOTE — Telephone Encounter (Signed)
Can you call this patient? She stated she called last week, I'm unsure if I talked to her or not but she said she really needs to speak with you. [phone]

## 2022-01-15 NOTE — Progress Notes (Signed)
Office Visit Note   Patient: [name]           Date of Birth: [birth year]           MRN: [id number] Visit Date: 01/15/2022              Requested by: Lorrene Reid, PA-C [address],  [place] [id number] PCP: Lorrene Reid, PA-C   Assessment & Plan: Visit Diagnoses:  1. Chronic pain of right knee   2. Chronic right-sided low back pain, unspecified whether sciatica present     Plan: Impression is right knee pain with effusion.  Etiology lateral meniscus tear versus progression of DJD in the lateral and patellofemoral compartments as evidenced on the x-rays.  I performed an aspiration and cortisone injection today.  22 cc aspirated which was sent to the lab.  If symptoms persist will need MRI to rule out lateral meniscus tear.  Follow-Up Instructions: No follow-ups on file.   Orders:  Orders Placed This Encounter  Procedures   Large Joint Inj: R knee   XR KNEE 3 VIEW RIGHT   XR Lumbar Spine 2-3 Views   Synovial Fluid Analysis, Complete   No orders of the defined types were placed in this encounter.     Procedures: Large Joint Inj: R knee on 01/15/2022 2:16 PM Indications: pain Details: 22 G needle  Arthrogram: No  Medications: 40 mg methylPREDNISolone acetate 40 MG/ML; 2 mL lidocaine 1 %; 2 mL bupivacaine 0.5 % Aspirate: 22 mL blood-tinged; sent for lab analysis Outcome: tolerated well, no immediate complications Consent was given by the patient. Patient was prepped and draped in the usual sterile fashion.       Clinical Data: No additional findings.   Subjective: Chief Complaint  Patient presents with   Right Knee - Pain   Lower Back - Pain    HPI [name] returns today for right knee pain.  Started a while ago.  Has some associated low back pain but mainly she has lateral knee pain that prevents her from a standing or walking for more than 30 minutes at a time.  Flexing her knee helps.  Underwent medial partial knee replacement in  2016.  [place] any injuries.  Review of Systems  Constitutional: Negative.   HENT: Negative.    Eyes: Negative.   Respiratory: Negative.    Cardiovascular: Negative.   Endocrine: Negative.   Musculoskeletal: Negative.   Neurological: Negative.   Hematological: Negative.   Psychiatric/Behavioral: Negative.    All other systems reviewed and are negative.    Objective: Vital Signs: LMP 02/07/2013   Physical Exam Vitals and nursing note reviewed.  Constitutional:      Appearance: She is well-developed.  HENT:     Head: Normocephalic and atraumatic.  Pulmonary:     Effort: Pulmonary effort is normal.  Abdominal:     Palpations: Abdomen is soft.  Musculoskeletal:     Cervical back: Neck supple.  Skin:    General: Skin is warm.     Capillary Refill: Capillary refill takes less than 2 seconds.  Neurological:     Mental Status: She is alert and oriented to person, place, and time.  Psychiatric:        Behavior: Behavior normal.        Thought Content: Thought content normal.        Judgment: Judgment normal.     Ortho Exam Emanation of right knee shows moderate joint effusion.  Fully  healed surgical scar.  Lateral joint line tenderness.  Normal range of motion. Specialty Comments:  No specialty comments available.  Imaging: No results found.   PMFS History: Patient Active Problem List   Diagnosis Date Noted   Cellulitis of right breast 10/31/2021   Breast infection 09/23/2021   Genetic testing 04/10/2021   History of depression 03/27/2021   Easy bruising 03/27/2021   Bleeds easily ([place]) 03/27/2021   Malignant neoplasm of upper-outer quadrant of right breast in female, estrogen receptor positive ([place]) 03/26/2021   S/P repair of paraesophageal hernia 12/17/2020   Anxiety 11/02/2019   B12 deficiency 11/02/2019   Adjustment disorder with mixed anxiety and depressed mood 08/22/2019   Iron deficiency anemia 08/22/2019   Gastroesophageal reflux disease 08/22/2019    Chronic joint pain 08/22/2019   Stage 3 chronic kidney disease ([place]) 08/22/2019   Obesity (BMI 35.0-39.9 without comorbidity) 02/28/2019   Normocytic anemia, not due to blood loss 02/28/2019   Restless legs syndrome (RLS) 06/30/2018   Arthrosis of first carpometacarpal joint 02/11/2018   Primary osteoarthritis of first carpometacarpal joint of left hand    Pain in left hand 12/23/2017   Healthcare maintenance 11/16/2017   Tinea versicolor 11/16/2017   Chronic right shoulder pain 09/02/2017   Fibromyalgia 08/11/2017   Family history of diabetes mellitus 06/23/2016   Other fatigue 06/23/2016   Insomnia 06/23/2016   Rheumatoid arthritis ([place]) 06/23/2016   Status post right partial knee replacement 09/15/2014   Status post total knee replacement using cement 06/08/2014   Screening for colon cancer    Schatzki's ring    Hiatal hernia    Dysphagia 05/26/2014   Encounter for screening colonoscopy 05/26/2014   Adrenal adenoma 05/01/2014   Breast lump 05/01/2014   Metatarsalgia of both feet 01/12/2014   Bilateral leg pain 01/11/2014   Bilateral edema of lower extremity 01/11/2014   Other osteoarthritis of spine, thoracolumbar region 12/26/2013   Hypercholesteremia 05/19/2013   Periodic health assessment, general screening, adult 04/13/2013   GERD (gastroesophageal reflux disease) 04/13/2013   Essential hypertension 04/13/2013   Dyspnea 04/13/2013   Past Medical History:  Diagnosis Date   Acid reflux    takes Zantac and Omeprazole daily   Anemia    Anxiety    takes Citaopram daily   Arthritis    right knee   Arthrosis    left thumb CMC   Chronic back pain    DDD   Clotting disorder (HCC)    hx of blood clot following knee scope   Depression    Fibromyalgia    History of bronchitis 3+yrs ago   Hyperlipidemia    takes Atorvastatin daily   Hypertension    takes Lisinopril and HCTZ daily   Insomnia    takes Elavil nightly as needed   Paraesophageal hernia     Family  History  Problem Relation Age of Onset   Stroke Mother    Lung cancer Father 73   GI problems Father    Prostate cancer Father    Cancer Sister        breast   Breast cancer Sister 45   Lung cancer Maternal Grandfather    Lung cancer Paternal Grandfather    Diabetes Son    Colon cancer Neg Hx    Esophageal cancer Neg Hx    Pancreatic cancer Neg Hx     Past Surgical History:  Procedure Laterality Date   BREAST BIOPSY Left    BREAST LUMPECTOMY WITH RADIOACTIVE SEED AND SENTINEL  LYMPH NODE BIOPSY Right 06/03/2021   Procedure: RIGHT BREAST LUMPECTOMY WITH RADIOACTIVE SEED AND SENTINEL LYMPH NODE BIOPSY;  Surgeon: Jovita Kussmaul, MD;  Location: [facility];  Service: General;  Laterality: Right;   BUNIONECTOMY Right 02/18/2018   Procedure: Yehuda Budd;  Surgeon: Edrick Kins, DPM;  Location: [facility];  Service: Podiatry;  Laterality: Right;   CAPSULOTOMY Bilateral 02/18/2018   Procedure: CAPSULOTOMY MPJ RELEASE JOINT 2N BILATERAL;  Surgeon: Edrick Kins, DPM;  Location: [facility];  Service: Podiatry;  Laterality: Bilateral;   CARPOMETACARPEL SUSPENSION PLASTY Left 01/27/2018   Procedure: LEFT THUMB ligament reconstruction and tendon interposition;  Surgeon: Leandrew Koyanagi, MD;  Location: [facility];  Service: Orthopedics;  Laterality: Left;   CARPOMETACARPEL SUSPENSION PLASTY Left 03/07/2020   Procedure: REVISION LEFT THUMB CARPOMETACARPAL ([place]) ARTHROPLASTY;  Surgeon: Leandrew Koyanagi, MD;  Location: [facility];  Service: Orthopedics;  Laterality: Left;   CHONDROPLASTY Right 06/28/2014   Procedure: CHONDROPLASTY;  Surgeon: Marianna Payment, MD;  Location: [facility];  Service: Orthopedics;  Laterality: Right;   COLONOSCOPY N/A 05/29/2014   Procedure: COLONOSCOPY;  Surgeon: Daneil Dolin, MD;  [facility];  Service: Endoscopy;  Laterality: N/A;  215pm- Pt is working until 12:00 so she can't come any earlier    ESOPHAGOGASTRODUODENOSCOPY N/A 05/29/2014   Procedure: ESOPHAGOGASTRODUODENOSCOPY (EGD);  Surgeon: Daneil Dolin, MD;  [facility];  Service: Endoscopy;  Laterality: N/A;   ESOPHAGOGASTRODUODENOSCOPY N/A 12/17/2020   Procedure: ESOPHAGOGASTRODUODENOSCOPY (EGD);  Surgeon: Lajuana Matte, MD;  Location: [facility];  Service: Thoracic;  Laterality: N/A;   GANGLION CYST EXCISION Left 01/13/2002   HAMMER TOE SURGERY Bilateral 02/18/2018   Procedure: HAMMER TOE CORRECTION2ND BILATERAL;  Surgeon: Edrick Kins, DPM;  Location: [facility];  Service: Podiatry;  Laterality: Bilateral;   HERNIA REPAIR     IRRIGATION AND DEBRIDEMENT ABSCESS Right 09/08/2021   Procedure: IRRIGATION AND DEBRIDEMENT RIGHT BREAST ABSCESS;  Surgeon: Donnie Mesa, MD;  [facility];  Service: General;  Laterality: Right;   KNEE ARTHROSCOPY WITH MEDIAL MENISECTOMY Right 06/28/2014   Procedure: RIGHT KNEE ARTHROSCOPY WITH PARTIAL MEDIAL MENISCECTOMY AND CHONDROPLASTY;  Surgeon: Marianna Payment, MD;  Location: [facility];  Service: Orthopedics;  Laterality: Right;   MALONEY DILATION N/A 05/29/2014   Procedure: Venia Minks DILATION;  Surgeon: Daneil Dolin, MD;  [facility];  Service: Endoscopy;  Laterality: N/A;   PARTIAL KNEE ARTHROPLASTY Right 09/15/2014   Procedure: RIGHT UNICOMPARTMENTAL KNEE ARTHROPLASTY;  Surgeon: Leandrew Koyanagi, MD;  Location: [facility];  Service: Orthopedics;  Laterality: Right;   SHOULDER ARTHROSCOPY Right    TOTAL KNEE ARTHROPLASTY Left 04/08/2004   XI ROBOTIC ASSISTED PARAESOPHAGEAL HERNIA REPAIR N/A 12/17/2020   Procedure: XI ROBOTIC ASSISTED Laparoscopy PARAESOPHAGEAL HERNIA REPAIR WITH FUNDOPLICATION;  Surgeon: Lajuana Matte, MD;  Location: [facility];  Service: Thoracic;  Laterality: N/A;   Social History   Occupational History   Occupation: Scientist, water quality  Tobacco Use   Smoking status: Never   Smokeless tobacco: Never  Vaping Use   Vaping Use: Never used   Substance and Sexual Activity   Alcohol use: No    Alcohol/week: 0.0 standard drinks of alcohol   Drug use: No   Sexual activity: Not Currently    Birth control/protection: None, Post-menopausal

## 2022-01-16 LAB — SYNOVIAL FLUID ANALYSIS, COMPLETE
Basophils, %: 0 %
Eosinophils-Synovial: 0 % (ref 0–2)
Lymphocytes-Synovial Fld: 90 % — ABNORMAL HIGH (ref 0–74)
Monocyte/Macrophage: 4 % (ref 0–69)
Neutrophil, Synovial: 6 % (ref 0–24)
Synoviocytes, %: 0 % (ref 0–15)
WBC, Synovial: 679 cells/uL — ABNORMAL HIGH (ref <150)

## 2022-01-17 ENCOUNTER — Other Ambulatory Visit: Payer: Self-pay | Admitting: Physician Assistant

## 2022-01-17 DIAGNOSIS — I10 Essential (primary) hypertension: Secondary | ICD-10-CM

## 2022-01-18 ENCOUNTER — Other Ambulatory Visit: Payer: Self-pay | Admitting: Physician Assistant

## 2022-01-18 DIAGNOSIS — I10 Essential (primary) hypertension: Secondary | ICD-10-CM

## 2022-01-24 ENCOUNTER — Telehealth: Payer: Self-pay | Admitting: Physician Assistant

## 2022-01-24 ENCOUNTER — Telehealth: Payer: Self-pay | Admitting: Orthopaedic Surgery

## 2022-01-24 ENCOUNTER — Other Ambulatory Visit: Payer: Self-pay | Admitting: Physician Assistant

## 2022-01-24 MED ORDER — PREDNISONE 10 MG (21) PO TBPK: ORAL_TABLET | ORAL | 0 refills | Status: DC

## 2022-01-24 MED ORDER — METHOCARBAMOL 750 MG PO TABS: 750.0000 mg | ORAL_TABLET | Freq: Two times a day (BID) | ORAL | 0 refills | Status: DC | PRN

## 2022-01-24 MED ORDER — ACETAMINOPHEN-CODEINE 300-30 MG PO TABS: 1.0000 | ORAL_TABLET | Freq: Two times a day (BID) | ORAL | 0 refills | Status: DC | PRN

## 2022-01-24 NOTE — Telephone Encounter (Signed)
Pt called and states he is having a lot of back pain. She said back has been having spasms and tylenol is not work and unable to take ibuprofen because blood thinners. She wants to know what to do?   Cb [phone]

## 2022-01-24 NOTE — Telephone Encounter (Signed)
I relayed information today. She said that she does not think the cortisone injection into her knee is going to help.

## 2022-01-24 NOTE — Telephone Encounter (Signed)
Received call from [facility] with [place] needing to verify diag for Rx Tylenol 3.  The number to contact Gildardo Cranker is [phone]

## 2022-01-24 NOTE — Telephone Encounter (Signed)
Called pharmacy back. I was placed on hold for 33mn. I hung up and called back again, and was placed on extended hold again.

## 2022-01-24 NOTE — Telephone Encounter (Signed)
I sent in tylenol 3, robaxin and a sterapred taper

## 2022-02-01 ENCOUNTER — Other Ambulatory Visit: Payer: Self-pay | Admitting: Physician Assistant

## 2022-02-01 DIAGNOSIS — I10 Essential (primary) hypertension: Secondary | ICD-10-CM

## 2022-02-02 ENCOUNTER — Other Ambulatory Visit: Payer: Self-pay | Admitting: Physician Assistant

## 2022-02-02 DIAGNOSIS — I10 Essential (primary) hypertension: Secondary | ICD-10-CM

## 2022-02-05 ENCOUNTER — Other Ambulatory Visit: Payer: Self-pay | Admitting: Physician Assistant

## 2022-02-05 DIAGNOSIS — I10 Essential (primary) hypertension: Secondary | ICD-10-CM

## 2022-02-17 ENCOUNTER — Ambulatory Visit: Payer: Medicare Other

## 2022-02-20 ENCOUNTER — Encounter: Payer: Self-pay | Admitting: Physician Assistant

## 2022-02-20 ENCOUNTER — Ambulatory Visit (INDEPENDENT_AMBULATORY_CARE_PROVIDER_SITE_OTHER): Payer: Medicare Other | Admitting: Physician Assistant

## 2022-02-20 VITALS — BP 107/75 | HR 81 | Ht 63.5 in | Wt 223.6 lb

## 2022-02-20 DIAGNOSIS — F419 Anxiety disorder, unspecified: Secondary | ICD-10-CM | POA: Diagnosis not present

## 2022-02-20 DIAGNOSIS — I10 Essential (primary) hypertension: Secondary | ICD-10-CM | POA: Diagnosis not present

## 2022-02-20 DIAGNOSIS — C50411 Malignant neoplasm of upper-outer quadrant of right female breast: Secondary | ICD-10-CM | POA: Diagnosis not present

## 2022-02-20 DIAGNOSIS — F32A Depression, unspecified: Secondary | ICD-10-CM

## 2022-02-20 DIAGNOSIS — Z170 Estrogen receptor positive status [ER+]: Secondary | ICD-10-CM

## 2022-02-20 MED ORDER — CLONIDINE HCL 0.1 MG PO TABS: 0.1000 mg | ORAL_TABLET | Freq: Two times a day (BID) | ORAL | 1 refills | Status: DC

## 2022-02-20 MED ORDER — FLUOXETINE HCL 40 MG PO CAPS: 40.0000 mg | ORAL_CAPSULE | Freq: Every day | ORAL | 1 refills | Status: DC

## 2022-02-20 MED ORDER — AMLODIPINE BESYLATE 5 MG PO TABS: 5.0000 mg | ORAL_TABLET | Freq: Every day | ORAL | 1 refills | Status: DC

## 2022-02-20 NOTE — Progress Notes (Signed)
Established patient visit   Patient: [name]   DOB: 28-Dec-1960   61 y.o. Female  MRN: [id number] Visit Date: 02/20/2022  No chief complaint on file.  Subjective    HPI  Patient presents for follow-up on hypertension and mood. Patient reports having trouble getting refills for amlodipine and clonidine. Taking metoprolol succinate 25 mg and hydrochlorothiazide 50 mg daily. Reports her blood pressure tends to be higher in the morning which improves throughout the day. BP readings range from 135-140/80-90s. No chest pain, palpitations, syncope or edema. Does reports has been experiencing muscle spasms of low back and most recently also started having left sided neck and shoulder pain. Patient is followed by orthopedics. Also having some headaches. Reports medication compliance with fluoxetine. Overall mood has been stable.       02/20/2022    3:37 PM 11/20/2021    3:26 PM 10/02/2021   10:00 AM 02/12/2021    2:01 PM 10/25/2020    1:58 PM  Depression screen PHQ 2/9  Decreased Interest '1 1 2 1 1  ' Down, Depressed, Hopeless '2 2 2 3 1  ' PHQ - 2 Score '3 3 4 4 2  ' Altered sleeping '2 2 2 3 1  ' Tired, decreased energy '2 2 2 1 1  ' Change in appetite 0 1 1 0 0  Feeling bad or failure about yourself  '1 1 1 1 1  ' Trouble concentrating 0 1 1 0 0  Moving slowly or fidgety/restless 0 1 0 0 0  Suicidal thoughts 0 0 0 0 0  PHQ-9 Score '8 11 11 9 5  ' Difficult doing work/chores  Somewhat difficult Somewhat difficult  Somewhat difficult      02/20/2022    3:38 PM 11/20/2021    3:26 PM 10/02/2021   10:01 AM 02/12/2021    2:02 PM  GAD 7 : Generalized Anxiety Score  Nervous, Anxious, on Edge '2 2 2 3  ' Control/stop worrying '2 2 2 3  ' Worry too much - different things '2 2 1 3  ' Trouble relaxing 0 '2 1 3  ' Restless 0 0 0 0  Easily annoyed or irritable 0 0 0 0  Afraid - awful might happen '1 1 1 1  ' Total GAD 7 Score '7 9 7 13  ' Anxiety Difficulty  Somewhat difficult Somewhat difficult          Medications: Outpatient Medications Prior to Visit  Medication Sig   acetaminophen (TYLENOL) 325 MG tablet Take 2 tablets (650 mg total) by mouth every 6 (six) hours as needed for mild pain (or Fever >/= 101).   acetaminophen-codeine (TYLENOL #3) 300-30 MG tablet Take 1 tablet by mouth 2 (two) times daily as needed for moderate pain.   albuterol (VENTOLIN HFA) 108 (90 Base) MCG/ACT inhaler Inhale 2 puffs into the lungs every 6 (six) hours as needed for wheezing or shortness of breath.   apixaban (ELIQUIS) 5 MG TABS tablet Take 1 tablet (5 mg total) by mouth 2 (two) times daily.   dextromethorphan-guaiFENesin (MUCINEX DM) 30-600 MG 12hr tablet Take 1 tablet by mouth 2 (two) times daily.   hydrochlorothiazide (HYDRODIURIL) 50 MG tablet Take 1 tablet by mouth once daily   letrozole (FEMARA) 2.5 MG tablet Take 1 tablet (2.5 mg total) by mouth daily. (Patient taking differently: Take 2.5 mg by mouth every evening.)   methocarbamol (ROBAXIN-750) 750 MG tablet Take 1 tablet (750 mg total) by mouth 2 (two) times daily as needed for muscle spasms.   metoprolol succinate (TOPROL-XL)  25 MG 24 hr tablet Take 1 tablet by mouth once daily   omeprazole (PRILOSEC OTC) 20 MG tablet Take 1 tablet (20 mg total) by mouth daily.   ondansetron (ZOFRAN-ODT) 4 MG disintegrating tablet Take 1 tablet (4 mg total) by mouth 2 (two) times daily as needed for nausea or vomiting.   predniSONE (STERAPRED UNI-PAK 21 TAB) 10 MG (21) TBPK tablet Take as directed   [DISCONTINUED] amLODipine (NORVASC) 5 MG tablet Take 1 tablet by mouth once daily   [DISCONTINUED] cloNIDine (CATAPRES) 0.1 MG tablet Take 1 tablet (0.1 mg total) by mouth 2 (two) times daily.   [DISCONTINUED] FLUoxetine (PROZAC) 40 MG capsule Take 1 capsule by mouth once daily   [DISCONTINUED] lisinopril (ZESTRIL) 40 MG tablet Take 1 tablet by mouth once daily   No facility-administered medications prior to visit.    Review of Systems Review of Systems:  A  fourteen system review of systems was performed and found to be positive as per HPI.  Last CBC Lab Results  Component Value Date   WBC 9.6 11/26/2021   HGB 13.2 11/26/2021   HCT 39.9 11/26/2021   MCV 90 11/26/2021   MCH 29.7 11/26/2021   RDW 14.2 11/26/2021   PLT 361 41/63/8453   Last metabolic panel Lab Results  Component Value Date   GLUCOSE 106 (H) 12/23/2021   NA 142 12/23/2021   K 4.0 12/23/2021   CL 104 12/23/2021   CO2 20 12/23/2021   BUN 16 12/23/2021   CREATININE 1.13 (H) 12/23/2021   EGFR 56 (L) 12/23/2021   CALCIUM 10.0 12/23/2021   PHOS 4.4 (H) 12/23/2021   PROT 6.6 12/02/2021   ALBUMIN 4.2 12/23/2021   LABGLOB 2.8 11/26/2021   AGRATIO 1.5 11/26/2021   BILITOT 0.2 12/02/2021   ALKPHOS 85 12/02/2021   AST 18 12/02/2021   ALT 20 12/02/2021   ANIONGAP 8 11/02/2021   Last lipids Lab Results  Component Value Date   CHOL 236 (H) 06/29/2018   HDL 57 06/29/2018   LDLCALC 155 (H) 06/29/2018   TRIG 122 06/29/2018   CHOLHDL 4.1 06/29/2018   Last hemoglobin A1c Lab Results  Component Value Date   HGBA1C 5.8 (H) 08/22/2019   Last thyroid functions Lab Results  Component Value Date   TSH 1.720 08/22/2019   Last vitamin D Lab Results  Component Value Date   VD25OH 18.4 (L) 08/22/2019       Objective    BP 107/75   Pulse 81   Ht 5' 3.5" (1.613 m)   Wt 223 lb 9.6 oz (101.4 kg)   LMP 02/07/2013   SpO2 97%   BMI 38.99 kg/m  BP Readings from Last 3 Encounters:  02/20/22 107/75  11/28/21 126/78  11/20/21 96/66   Wt Readings from Last 3 Encounters:  02/20/22 223 lb 9.6 oz (101.4 kg)  11/28/21 217 lb 9.6 oz (98.7 kg)  11/20/21 219 lb (99.3 kg)    Physical Exam  General:  Pleasant and cooperative, appropriate for stated age.  Neuro:  Alert and oriented,  extra-ocular muscles intact  HEENT:  Normocephalic, atraumatic, neck supple  Skin:  no gross rash, warm, pink. Cardiac:  RRR, S1 S2 wnl's Respiratory: CTA B/L  w/o rhonchi or  wheezing. Vascular:  Ext warm, no cyanosis apprec.; cap RF less 2 sec. Psych:  No HI/SI, judgement and insight good, Euthymic mood. Full Affect.   No results found for any visits on 02/20/22.  Assessment & Plan  Problem List Items Addressed This Visit       Cardiovascular and Mediastinum   Essential hypertension - Primary    -BP in office well controlled. Discussed with patient to continue with amlodipine 5 mg daily, metoprolol succinate 25 mg daily, HCTZ 50 mg daily and clonidine 0.1 mg BID. Lisinopril was discontinued and patient started on clonidine due to renal decline and at that time BP was uncontrolled. If BP remains well controlled then treatment adjustments can be considered such as reducing clonidine. Unable to obtain CMP today.      Relevant Medications   amLODipine (NORVASC) 5 MG tablet   cloNIDine (CATAPRES) 0.1 MG tablet   Other Relevant Orders   Comp Met (CMET)     Other   Anxiety   Relevant Medications   FLUoxetine (PROZAC) 40 MG capsule   Malignant neoplasm of upper-outer quadrant of right breast in female, estrogen receptor positive ([place])   Other Visit Diagnoses     Depression, unspecified depression type       Relevant Medications   FLUoxetine (PROZAC) 40 MG capsule      Anxiety, Depression: -Stable. No SI/HI. Recommend to continue fluoxetine 40 mg daily.   Malignant neoplasm of upper-outer quadrant of right breast in female, estrogen receptor positive: -Followed by oncology. Patient on Letrozole 2.5 mg. Discussed with patient discussing with her oncologist possible medication side effects including headache and joint pain.  Return in about 4 months (around 06/23/2022) for HTN, mood.        Lorrene Reid, PA-C  [facility] [phone] (phone) [phone] (fax)  [place]

## 2022-02-20 NOTE — Assessment & Plan Note (Signed)
-  BP in office well controlled. Discussed with patient to continue with amlodipine 5 mg daily, metoprolol succinate 25 mg daily, HCTZ 50 mg daily and clonidine 0.1 mg BID. Lisinopril was discontinued and patient started on clonidine due to renal decline and at that time BP was uncontrolled. If BP remains well controlled then treatment adjustments can be considered such as reducing clonidine. Unable to obtain CMP today.

## 2022-02-20 NOTE — Patient Instructions (Signed)
Letrozole Tablets What is this medication? LETROZOLE (LET roe zole) treats some types of breast cancer. It works by decreasing the amount of estrogen hormone your body makes, which slows or stops breast cancer cells from spreading or growing. This medicine may be used for other purposes; ask your health care provider or pharmacist if you have questions. COMMON BRAND NAME(S): Femara What should I tell my care team before I take this medication? They need to know if you have any of these conditions: High cholesterol Liver disease Osteoporosis (weak bones) An unusual or allergic reaction to letrozole, other medications, foods, dyes, or preservatives Pregnant or trying to get pregnant Breast-feeding How should I use this medication? Take this medication by mouth with a glass of water. You may take it with or without food. Follow the directions on the prescription label. Take your medication at regular intervals. Do not take your medication more often than directed. Do not stop taking except on your care team's advice. Talk to your care team about the use of this medication in children. Special care may be needed. Overdosage: If you think you have taken too much of this medicine contact a poison control center or emergency room at once. NOTE: This medicine is only for you. Do not share this medicine with others. What if I miss a dose? If you miss a dose, take it as soon as you can. If it is almost time for your next dose, take only that dose. Do not take double or extra doses. What may interact with this medication? Do not take this medication with any of the following: Estrogens, like hormone replacement therapy or birth control pills This medication may also interact with the following: Dietary supplements such as androstenedione or DHEA Prasterone Tamoxifen This list may not describe all possible interactions. Give your health care provider a list of all the medicines, herbs,  non-prescription drugs, or dietary supplements you use. Also tell them if you smoke, drink alcohol, or use illegal drugs. Some items may interact with your medicine. What should I watch for while using this medication? Tell your care team if your symptoms do not start to get better or if they get worse. Do not become pregnant while taking this medication or for 3 weeks after stopping it. Women should inform their care team if they wish to become pregnant or think they might be pregnant. There is a potential for serious side effects to an unborn child. Talk to your care team or pharmacist for more information. Do not breast-feed while taking this medication or for 3 weeks after stopping it. This medication may interfere with the ability to have a child. Talk with your care team if you are concerned about your fertility. Using this medication for a long time may increase your risk of low bone mass. Talk to your care team about bone health. You may get drowsy or dizzy. Do not drive, use machinery, or do anything that needs mental alertness until you know how this medication affects you. Do not stand or sit up quickly, especially if you are an older patient. This reduces the risk of dizzy or fainting spells. You may need blood work done while you are taking this medication. What side effects may I notice from receiving this medication? Side effects that you should report to your care team as soon as possible: Allergic reactions--skin rash, itching, hives, swelling of the face, lips, tongue, or throat Side effects that usually do not require medical attention (report to  your care team if they continue or are bothersome): Dizziness Fatigue Headache Hot flashes Joint Pain Swelling of the ankles, hands, or feet This list may not describe all possible side effects. Call your doctor for medical advice about side effects. You may report side effects to FDA at 1-800-FDA-1088. Where should I keep my  medication? Keep out of the reach of children. Store between 15 and 30 degrees C (59 and 86 degrees F). Throw away any unused medication after the expiration date. NOTE: This sheet is a summary. It may not cover all possible information. If you have questions about this medicine, talk to your doctor, pharmacist, or health care provider.  2023 Elsevier/Gold Standard (2020-11-15 00:00:00)

## 2022-03-09 ENCOUNTER — Other Ambulatory Visit: Payer: Self-pay | Admitting: Physician Assistant

## 2022-03-09 DIAGNOSIS — I2699 Other pulmonary embolism without acute cor pulmonale: Secondary | ICD-10-CM

## 2022-03-11 ENCOUNTER — Ambulatory Visit
Admission: RE | Admit: 2022-03-11 | Discharge: 2022-03-11 | Disposition: A | Discharge status: Home / Self Care | Payer: Medicare Other | Source: Ambulatory Visit | Attending: Adult Health | Admitting: Adult Health

## 2022-03-11 DIAGNOSIS — Z170 Estrogen receptor positive status [ER+]: Secondary | ICD-10-CM

## 2022-03-11 HISTORY — DX: Malignant neoplasm of unspecified site of unspecified female breast: C50.919

## 2022-03-11 HISTORY — DX: Personal history of irradiation: Z92.3

## 2022-03-28 ENCOUNTER — Other Ambulatory Visit: Payer: Self-pay | Admitting: Thoracic Surgery (Cardiothoracic Vascular Surgery)

## 2022-03-28 ENCOUNTER — Ambulatory Visit (INDEPENDENT_AMBULATORY_CARE_PROVIDER_SITE_OTHER): Payer: Medicare Other | Admitting: Thoracic Surgery (Cardiothoracic Vascular Surgery)

## 2022-03-28 ENCOUNTER — Ambulatory Visit
Admission: RE | Admit: 2022-03-28 | Discharge: 2022-03-28 | Disposition: A | Discharge status: Home / Self Care | Payer: Medicare Other | Source: Ambulatory Visit | Attending: Thoracic Surgery (Cardiothoracic Vascular Surgery) | Admitting: Thoracic Surgery (Cardiothoracic Vascular Surgery)

## 2022-03-28 VITALS — BP 115/73 | HR 70 | Resp 20 | Ht 63.0 in | Wt 221.0 lb

## 2022-03-28 DIAGNOSIS — Z8719 Personal history of other diseases of the digestive system: Secondary | ICD-10-CM | POA: Diagnosis not present

## 2022-03-28 DIAGNOSIS — Z9889 Other specified postprocedural states: Secondary | ICD-10-CM

## 2022-03-28 DIAGNOSIS — R1310 Dysphagia, unspecified: Secondary | ICD-10-CM

## 2022-03-28 NOTE — Progress Notes (Signed)
      [address]       [place] [address]             [phone]        Romonda M [facility] Medical Record #[id number] Date of Birth: 04-Jul-1960  Referring: Mauri Pole, MD Primary Care: Lorrene Reid, PA-C Primary Cardiologist:None  Reason for visit:   follow-up  History of Present Illness:     61yo female s/p PEH repair in 2022, now has worsening reflux  Physical Exam: BP 115/73 (BP Location: Left Arm, Patient Position: Sitting)   Pulse 70   Resp 20   Ht '5\' 3"'$  (1.6 m)   Wt 221 lb (100.2 kg)   LMP 02/07/2013   SpO2 98% Comment: RA  BMI 39.15 kg/m   Alert NAD Abdomen, ND no peripheral edema   Diagnostic Studies & Laboratory data: IMPRESSION: 1. Postsurgical changes at the gastroesophageal junction consistent with previous paraesophageal hernia repair. 2. Small hiatal hernia with significant spontaneous gastroesophageal reflux. No evidence of esophagitis or stricture.    Assessment / Plan:   61yo female s/p PEH.  Small hiatal hernia with reflux.  Will restart omeprazole.  Will f/u in 1 months   Lajuana Matte 03/28/2022 4:03 PM

## 2022-04-02 ENCOUNTER — Other Ambulatory Visit: Payer: Medicare Other

## 2022-04-22 ENCOUNTER — Other Ambulatory Visit: Payer: Self-pay

## 2022-04-22 ENCOUNTER — Inpatient Hospital Stay: Payer: Medicare Other | Attending: Hematology and Oncology | Admitting: Hematology and Oncology

## 2022-04-22 ENCOUNTER — Encounter: Payer: Self-pay | Admitting: General Practice

## 2022-04-22 VITALS — BP 135/73 | HR 83 | Temp 97.8°F | Resp 18 | Ht 63.0 in | Wt 224.6 lb

## 2022-04-22 DIAGNOSIS — Z79811 Long term (current) use of aromatase inhibitors: Secondary | ICD-10-CM | POA: Diagnosis not present

## 2022-04-22 DIAGNOSIS — Z923 Personal history of irradiation: Secondary | ICD-10-CM | POA: Diagnosis not present

## 2022-04-22 DIAGNOSIS — Z170 Estrogen receptor positive status [ER+]: Secondary | ICD-10-CM | POA: Diagnosis not present

## 2022-04-22 DIAGNOSIS — R232 Flushing: Secondary | ICD-10-CM | POA: Diagnosis not present

## 2022-04-22 DIAGNOSIS — C50411 Malignant neoplasm of upper-outer quadrant of right female breast: Secondary | ICD-10-CM | POA: Diagnosis present
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
## 2022-04-22 NOTE — Progress Notes (Signed)
[facility] Spiritual Care Note  Referred by Dr Lindi Adie for emotional support. Left voicemail encouraging return call.   [place], [place], [facility] Pager [phone] Voicemail [phone]
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
## 2022-04-23 ENCOUNTER — Other Ambulatory Visit: Payer: Self-pay

## 2022-04-23 ENCOUNTER — Other Ambulatory Visit: Payer: Self-pay | Admitting: Nurse Practitioner

## 2022-04-23 ENCOUNTER — Telehealth: Payer: Self-pay | Admitting: Licensed Clinical Social Worker

## 2022-04-23 DIAGNOSIS — I10 Essential (primary) hypertension: Secondary | ICD-10-CM

## 2022-04-23 DIAGNOSIS — I2699 Other pulmonary embolism without acute cor pulmonale: Secondary | ICD-10-CM

## 2022-04-23 MED ORDER — APIXABAN 5 MG PO TABS: 5.0000 mg | ORAL_TABLET | Freq: Two times a day (BID) | ORAL | 0 refills | Status: DC

## 2022-04-23 NOTE — Telephone Encounter (Signed)
Received return TC from pt. She is interested in potentially connecting to an agency for ongoing therapy related to past concerns. CSW provided list of three agencies that are paneled with her insurance and explained process for finding a therapist that feels like the right fit for her.  No other needs at this time.   Maalle Starrett E Rydan Gulyas, LCSW

## 2022-04-23 NOTE — Telephone Encounter (Signed)
[place] Work  Clinical Social Work was referred by medical provider for assessment of psychosocial needs.  Clinical Social Worker attempted to contact patient by phone  to offer support and assess for needs.   No answer. Left VM with direct contact information.     [place], [place] Worker [employer]

## 2022-04-25 ENCOUNTER — Ambulatory Visit (INDEPENDENT_AMBULATORY_CARE_PROVIDER_SITE_OTHER): Payer: Medicare Other | Admitting: Thoracic Surgery (Cardiothoracic Vascular Surgery)

## 2022-04-25 ENCOUNTER — Other Ambulatory Visit: Payer: Self-pay | Admitting: Thoracic Surgery (Cardiothoracic Vascular Surgery)

## 2022-04-25 DIAGNOSIS — Z8719 Personal history of other diseases of the digestive system: Secondary | ICD-10-CM

## 2022-04-25 DIAGNOSIS — K449 Diaphragmatic hernia without obstruction or gangrene: Secondary | ICD-10-CM

## 2022-04-25 NOTE — Progress Notes (Signed)
     [address]       [place] [address]             [phone]       Patient: Home Provider: Office Consent for Telemedicine visit obtained.  Today's visit was completed via a real-time telehealth (see specific modality noted below). The patient/authorized person provided oral consent at the time of the visit to engage in a telemedicine encounter with the present provider at [facility]. The patient/authorized person was informed of the potential benefits, limitations, and risks of telemedicine. The patient/authorized person expressed understanding that the laws that protect confidentiality also apply to telemedicine. The patient/authorized person acknowledged understanding that telemedicine does not provide emergency services and that he or she would need to call 911 or proceed to the nearest hospital for help if such a need arose.   Total time spent in the clinical discussion 10 minutes.  Telehealth Modality: Phone visit (audio only)  I had a telephone visit with Mrs. Gappa.  She has had 2 episodes of sharp pain with meals.  Once at Thanksgiving.  He reflux is better with prilosec.    I will obtain a CT chest, and have her follow-up.  Shaneeka Scarboro [name]

## 2022-05-02 ENCOUNTER — Ambulatory Visit: Payer: Medicare Other | Admitting: Thoracic Surgery (Cardiothoracic Vascular Surgery)

## 2022-05-02 ENCOUNTER — Ambulatory Visit (HOSPITAL_COMMUNITY): Payer: Medicare Other

## 2022-05-09 ENCOUNTER — Ambulatory Visit: Payer: Medicare Other | Admitting: Thoracic Surgery (Cardiothoracic Vascular Surgery)

## 2022-05-09 ENCOUNTER — Ambulatory Visit (HOSPITAL_COMMUNITY): Payer: Medicare Other

## 2022-05-20 ENCOUNTER — Other Ambulatory Visit: Payer: Self-pay | Admitting: Nurse Practitioner

## 2022-05-20 DIAGNOSIS — I2699 Other pulmonary embolism without acute cor pulmonale: Secondary | ICD-10-CM

## 2022-05-21 ENCOUNTER — Ambulatory Visit (HOSPITAL_COMMUNITY): Payer: Medicaid Other

## 2022-05-23 ENCOUNTER — Ambulatory Visit: Payer: Medicare Other | Admitting: Thoracic Surgery (Cardiothoracic Vascular Surgery)

## 2022-05-26 ENCOUNTER — Other Ambulatory Visit: Payer: Self-pay

## 2022-05-26 DIAGNOSIS — I10 Essential (primary) hypertension: Secondary | ICD-10-CM

## 2022-05-26 MED ORDER — CLONIDINE HCL 0.1 MG PO TABS: 0.1000 mg | ORAL_TABLET | Freq: Two times a day (BID) | ORAL | 0 refills | Status: DC

## 2022-06-02 ENCOUNTER — Ambulatory Visit (HOSPITAL_COMMUNITY)
Admission: RE | Admit: 2022-06-02 | Discharge: 2022-06-02 | Disposition: A | Discharge status: Home / Self Care | Payer: Medicare Other | Source: Ambulatory Visit | Attending: Thoracic Surgery (Cardiothoracic Vascular Surgery) | Admitting: Thoracic Surgery (Cardiothoracic Vascular Surgery)

## 2022-06-02 DIAGNOSIS — Z8719 Personal history of other diseases of the digestive system: Secondary | ICD-10-CM | POA: Insufficient documentation

## 2022-06-02 DIAGNOSIS — I700 Atherosclerosis of aorta: Secondary | ICD-10-CM | POA: Diagnosis not present

## 2022-06-02 DIAGNOSIS — K8020 Calculus of gallbladder without cholecystitis without obstruction: Secondary | ICD-10-CM | POA: Diagnosis not present

## 2022-06-02 DIAGNOSIS — R911 Solitary pulmonary nodule: Secondary | ICD-10-CM | POA: Insufficient documentation

## 2022-06-02 DIAGNOSIS — Z9889 Other specified postprocedural states: Secondary | ICD-10-CM | POA: Diagnosis present

## 2022-06-02 DIAGNOSIS — D3501 Benign neoplasm of right adrenal gland: Secondary | ICD-10-CM | POA: Diagnosis not present

## 2022-06-02 DIAGNOSIS — Z853 Personal history of malignant neoplasm of breast: Secondary | ICD-10-CM | POA: Diagnosis not present

## 2022-06-02 DIAGNOSIS — K449 Diaphragmatic hernia without obstruction or gangrene: Secondary | ICD-10-CM | POA: Insufficient documentation

## 2022-06-05 NOTE — H&P (View-Only) (Signed)
[address]       [place] [address]             [phone]        Harleen M Valdivia [place] Medical Record #[id number] Date of Birth: 08/12/1960  Referring: Mauri Pole, MD Primary Care: Lorrene Reid, PA-C Primary Cardiologist:None  Reason for visit:   follow-up  History of Present Illness:     62 year old female presents in follow-up to review cross-sectional imaging.  She previously underwent a paraesophageal hernia repair in 2022, but has developed new reflux symptoms, and nausea with meals.  She denies any dysphagia.  Physical Exam: BP 127/79 (BP Location: Left Arm, Patient Position: Sitting)   Pulse 87   Resp 20   Ht '5\' 3"'$  (1.6 m)   Wt 226 lb (102.5 kg)   LMP 02/07/2013   SpO2 96% Comment: RA  BMI 40.03 kg/m   Alert NAD Abdomen, ND No peripheral edema   Diagnostic Studies & Laboratory data: CT chest: Small recurrence of her hiatal hernia.  She also has a 7 mm nodule in her right upper lobe which has been stable for several years.    Assessment / Plan:   60-year-old female with a recurrent small hiatal hernia however she is symptomatic from this.  She also has a 7 mm pulmonary nodule which has been stable.  Regards to her hiatal hernia she would like this repaired.  She is tentatively scheduled for early in February for this.  Regards to the pulmonary nodule made a referral to pulmonary medicine for further evaluation of this.   Lajuana Matte 06/06/2022 3:17 PM

## 2022-06-05 NOTE — Progress Notes (Signed)
[address]       [place] [address]             [phone]        Rogan M Leys [place] Medical Record #[id number] Date of Birth: [birth year]  Referring: Mauri Pole, MD Primary Care: Lorrene Reid, PA-C Primary Cardiologist:None  Reason for visit:   follow-up  History of Present Illness:     62 year old female presents in follow-up to review cross-sectional imaging.  She previously underwent a paraesophageal hernia repair in 2022, but has developed new reflux symptoms, and nausea with meals.  She denies any dysphagia.  Physical Exam: BP 127/79 (BP Location: Left Arm, Patient Position: Sitting)   Pulse 87   Resp 20   Ht '5\' 3"'$  (1.6 m)   Wt 226 lb (102.5 kg)   LMP 02/07/2013   SpO2 96% Comment: RA  BMI 40.03 kg/m   Alert NAD Abdomen, ND No peripheral edema   Diagnostic Studies & Laboratory data: CT chest: Small recurrence of her hiatal hernia.  She also has a 7 mm nodule in her right upper lobe which has been stable for several years.    Assessment / Plan:   89-year-old female with a recurrent small hiatal hernia however she is symptomatic from this.  She also has a 7 mm pulmonary nodule which has been stable.  Regards to her hiatal hernia she would like this repaired.  She is tentatively scheduled for early in February for this.  Regards to the pulmonary nodule made a referral to pulmonary medicine for further evaluation of this.   Lajuana Matte 06/06/2022 3:17 PM

## 2022-06-06 ENCOUNTER — Ambulatory Visit (INDEPENDENT_AMBULATORY_CARE_PROVIDER_SITE_OTHER): Payer: Medicare Other | Admitting: Thoracic Surgery (Cardiothoracic Vascular Surgery)

## 2022-06-06 ENCOUNTER — Encounter: Payer: Self-pay | Admitting: *Deleted

## 2022-06-06 ENCOUNTER — Ambulatory Visit: Payer: Medicare Other | Admitting: Thoracic Surgery (Cardiothoracic Vascular Surgery)

## 2022-06-06 ENCOUNTER — Other Ambulatory Visit: Payer: Self-pay | Admitting: *Deleted

## 2022-06-06 VITALS — BP 127/79 | HR 87 | Resp 20 | Ht 63.0 in | Wt 226.0 lb

## 2022-06-06 DIAGNOSIS — K449 Diaphragmatic hernia without obstruction or gangrene: Secondary | ICD-10-CM

## 2022-06-09 NOTE — Pre-Procedure Instructions (Signed)
Surgical Instructions    Your procedure is scheduled on Thursday, June 12, 2022 at 12:00 PM.  Report to [facility] Main Entrance "A" at 10:00 A.M., then check in with the Admitting office.  Call this number if you have problems the morning of surgery:  [phone]   If you have any questions prior to your surgery date call [phone]: Open Monday-Friday 8am-4pm  *If you experience any cold or flu symptoms such as cough, fever, chills, shortness of breath, etc. between now and your scheduled surgery, please notify us.*    Remember:  Do not eat or drink after midnight the night before your surgery    Take these medicines the morning of surgery with A SIP OF WATER:  amLODipine (NORVASC)  cloNIDine (CATAPRES)  FLUoxetine (PROZAC)  dextromethorphan-guaiFENesin (MUCINEX DM)  metoprolol succinate (TOPROL-XL)  omeprazole (PRILOSEC OTC)   IF NEEDED: acetaminophen (TYLENOL)  acetaminophen-codeine (TYLENOL #3)  albuterol (VENTOLIN HFA) - Bring with you day of surgery. methocarbamol (ROBAXIN-750)  ondansetron (ZOFRAN-ODT)    **STOP ELIQUIS 3 DAYS PRIOR TO SURGERY. YOUR LAST DOSE WILL BE ON 06/08/22.**  As of today, STOP taking any Aspirin (unless otherwise instructed by your surgeon) Aleve, Naproxen, Ibuprofen, Motrin, Advil, Goody's, BC's, all herbal medications, fish oil, and all vitamins.                     Do NOT Smoke (Tobacco/Vaping) for 24 hours prior to your procedure.  If you use a CPAP at night, you may bring your mask/headgear for your overnight stay.   Contacts, glasses, piercing's, hearing aid's, dentures or partials may not be worn into surgery, please bring cases for these belongings.    For patients admitted to the hospital, discharge time will be determined by your treatment team.   Patients discharged the day of surgery will not be allowed to drive home, and someone needs to stay with them for 24 hours.  SURGICAL WAITING ROOM VISITATION Patients having  surgery or a procedure may have two support people in the waiting area. Visitors may stay in the waiting area during the procedure and switch out with other visitors if needed. Only 1 support person is allowed in the pre-op area with the patient AFTER the patient is prepped. This person cannot be switched out. Children under the age of 42 must have an adult accompany them who is not the patient. If the patient needs to stay at the hospital during part of their recovery, the visitor guidelines for inpatient rooms apply.  Please refer to the [facility] website for the visitor guidelines for Inpatients (after your surgery is over and you are in a regular room).    Special instructions:   [facility]- Preparing For Surgery  Before surgery, you can play an important role. Because skin is not sterile, your skin needs to be as free of germs as possible. You can reduce the number of germs on your skin by washing with CHG (chlorahexidine gluconate) Soap before surgery.  CHG is an antiseptic cleaner which kills germs and bonds with the skin to continue killing germs even after washing.    Oral Hygiene is also important to reduce your risk of infection.  Remember - BRUSH YOUR TEETH THE MORNING OF SURGERY WITH YOUR REGULAR TOOTHPASTE  Please do not use if you have an allergy to CHG or antibacterial soaps. If your skin becomes reddened/irritated stop using the CHG.  Do not shave (including legs and underarms) for at least 48 hours  prior to first CHG shower. It is OK to shave your face.  Please follow these instructions carefully.   Shower the NIGHT BEFORE SURGERY and the MORNING OF SURGERY  If you chose to wash your hair, wash your hair first as usual with your normal shampoo.  After you shampoo, rinse your hair and body thoroughly to remove the shampoo.  Use CHG Soap as you would any other liquid soap. You can apply CHG directly to the skin and wash gently with a scrungie or a clean washcloth.    Apply the CHG Soap to your body ONLY FROM THE NECK DOWN.  Do not use on open wounds or open sores. Avoid contact with your eyes, ears, mouth and genitals (private parts). Wash Face and genitals (private parts)  with your normal soap.   Wash thoroughly, paying special attention to the area where your surgery will be performed.  Thoroughly rinse your body with warm water from the neck down.  DO NOT shower/wash with your normal soap after using and rinsing off the CHG Soap.  Pat yourself dry with a CLEAN TOWEL.  Wear CLEAN PAJAMAS to bed the night before surgery  Place CLEAN SHEETS on your bed the night before your surgery  DO NOT SLEEP WITH PETS.   Day of Surgery: Take a shower with CHG soap. Do not wear jewelry or makeup Do not wear lotions, powders, perfumes/colognes, or deodorant. Do not shave 48 hours prior to surgery. Do not wear nail polish, gel polish, artificial nails, or any other type of covering on natural nails (fingers and toes) If you have artificial nails or gel coating that need to be removed by a nail salon, please have this removed prior to surgery. Artificial nails or gel coating may interfere with anesthesia's ability to adequately monitor your vital signs. Wear Clean/Comfortable clothing the morning of surgery Do not bring valuables to the hospital.  [facility] is not responsible for any belongings or valuables. Remember to brush your teeth WITH YOUR REGULAR TOOTHPASTE.   Please read over the following fact sheets that you were given.  If you received a COVID test during your pre-op visit  it is requested that you wear a mask when out in public, stay away from anyone that may not be feeling well and notify your surgeon if you develop symptoms. If you have been in contact with anyone that has tested positive in the last 10 days please notify you surgeon.

## 2022-06-10 ENCOUNTER — Encounter (HOSPITAL_COMMUNITY)
Admission: RE | Admit: 2022-06-10 | Discharge: 2022-06-10 | Disposition: A | Discharge status: Home / Self Care | Payer: Medicare Other | Source: Ambulatory Visit | Attending: Thoracic Surgery (Cardiothoracic Vascular Surgery) | Admitting: Thoracic Surgery (Cardiothoracic Vascular Surgery)

## 2022-06-10 ENCOUNTER — Ambulatory Visit (HOSPITAL_COMMUNITY)
Admission: RE | Admit: 2022-06-10 | Discharge: 2022-06-10 | Disposition: A | Discharge status: Home / Self Care | Payer: Medicare Other | Source: Ambulatory Visit | Attending: Thoracic Surgery (Cardiothoracic Vascular Surgery) | Admitting: Thoracic Surgery (Cardiothoracic Vascular Surgery)

## 2022-06-10 ENCOUNTER — Other Ambulatory Visit: Payer: Self-pay

## 2022-06-10 ENCOUNTER — Encounter (HOSPITAL_COMMUNITY): Payer: Self-pay

## 2022-06-10 VITALS — BP 117/77 | HR 74 | Temp 97.8°F | Resp 18 | Ht 63.0 in | Wt 226.7 lb

## 2022-06-10 DIAGNOSIS — Z01818 Encounter for other preprocedural examination: Secondary | ICD-10-CM | POA: Insufficient documentation

## 2022-06-10 DIAGNOSIS — I10 Essential (primary) hypertension: Secondary | ICD-10-CM

## 2022-06-10 DIAGNOSIS — Z1152 Encounter for screening for COVID-19: Secondary | ICD-10-CM | POA: Insufficient documentation

## 2022-06-10 DIAGNOSIS — K449 Diaphragmatic hernia without obstruction or gangrene: Secondary | ICD-10-CM

## 2022-06-10 LAB — SURGICAL PCR SCREEN
MRSA, PCR: NEGATIVE
Staphylococcus aureus: NEGATIVE

## 2022-06-10 LAB — COMPREHENSIVE METABOLIC PANEL
ALT: 25 U/L (ref 0–44)
AST: 28 U/L (ref 15–41)
Albumin: 3.7 g/dL (ref 3.5–5.0)
Alkaline Phosphatase: 93 U/L (ref 38–126)
Anion gap: 7 (ref 5–15)
BUN: 17 mg/dL (ref 6–20)
CO2: 29 mmol/L (ref 22–32)
Calcium: 9.7 mg/dL (ref 8.9–10.3)
Chloride: 98 mmol/L (ref 98–111)
Creatinine, Ser: 1.3 mg/dL — ABNORMAL HIGH (ref 0.44–1.00)
GFR, Estimated: 47 mL/min — ABNORMAL LOW (ref >60)
Glucose, Bld: 116 mg/dL — ABNORMAL HIGH (ref 70–99)
Potassium: 3.2 mmol/L — ABNORMAL LOW (ref 3.5–5.1)
Sodium: 134 mmol/L — ABNORMAL LOW (ref 135–145)
Total Bilirubin: 0.4 mg/dL (ref 0.3–1.2)
Total Protein: 7.1 g/dL (ref 6.5–8.1)

## 2022-06-10 LAB — URINALYSIS, ROUTINE W REFLEX MICROSCOPIC
Bilirubin Urine: NEGATIVE
Glucose, UA: NEGATIVE mg/dL
Hgb urine dipstick: NEGATIVE
Ketones, ur: NEGATIVE mg/dL
Leukocytes,Ua: NEGATIVE
Nitrite: NEGATIVE
Protein, ur: NEGATIVE mg/dL
Specific Gravity, Urine: 1.021 (ref 1.005–1.030)
pH: 6 (ref 5.0–8.0)

## 2022-06-10 LAB — TYPE AND SCREEN
ABO/RH(D): A POS
Antibody Screen: NEGATIVE

## 2022-06-10 LAB — CBC
HCT: 40.3 % (ref 36.0–46.0)
Hemoglobin: 13.2 g/dL (ref 12.0–15.0)
MCH: 29.6 pg (ref 26.0–34.0)
MCHC: 32.8 g/dL (ref 30.0–36.0)
MCV: 90.4 fL (ref 80.0–100.0)
Platelets: 367 10*3/uL (ref 150–400)
RBC: 4.46 MIL/uL (ref 3.87–5.11)
RDW: 13.6 % (ref 11.5–15.5)
WBC: 9.6 10*3/uL (ref 4.0–10.5)
nRBC: 0 % (ref 0.0–0.2)

## 2022-06-10 LAB — PROTIME-INR
INR: 1.1 (ref 0.8–1.2)
Prothrombin Time: 13.7 seconds (ref 11.4–15.2)

## 2022-06-10 LAB — APTT: aPTT: 27 seconds (ref 24–36)

## 2022-06-10 NOTE — Progress Notes (Signed)
PCP - Lorrene Reid, PA-C Cardiologist - Denies Oncologist: Dr. Nicholas Lose  PPM/ICD - Denies  Chest x-ray - 06/10/22 EKG - 06/10/22 Stress Test - Denies ECHO - 03/01/19 Cardiac Cath - Denies  Sleep Study - Denies  Diabetes: Denies  Blood Thinner Instructions: LD of Eliquis: 06/08/22 Aspirin Instructions: N/A  ERAS Protcol - No  COVID TEST- 06/10/22 in PAT   Anesthesia review: Yes  Patient denies shortness of breath, fever, cough and chest pain at PAT appointment   All instructions explained to the patient, with a verbal understanding of the material. Patient agrees to go over the instructions while at home for a better understanding. Patient also instructed to self quarantine after being tested for COVID-19. The opportunity to ask questions was provided.

## 2022-06-10 NOTE — Pre-Procedure Instructions (Signed)
Surgical Instructions    Your procedure is scheduled on Thursday, June 12, 2022 at 12:00 PM.  Report to [facility] Main Entrance "A" at 10:00 A.M., then check in with the Admitting office.  Call this number if you have problems the morning of surgery:  [phone]   If you have any questions prior to your surgery date call [phone]: Open Monday-Friday 8am-4pm  *If you experience any cold or flu symptoms such as cough, fever, chills, shortness of breath, etc. between now and your scheduled surgery, please notify us.*    Remember:  Do not eat or drink after midnight the night before your surgery    Take these medicines the morning of surgery with A SIP OF WATER:  amLODipine (NORVASC)  cloNIDine (CATAPRES)  FLUoxetine (PROZAC)  metoprolol succinate (TOPROL-XL)  omeprazole (PRILOSEC OTC)   IF NEEDED: acetaminophen (TYLENOL)  acetaminophen-codeine (TYLENOL #3)  albuterol (VENTOLIN HFA) - Bring with you day of surgery. methocarbamol (ROBAXIN-750)  ondansetron (ZOFRAN-ODT)    **STOP ELIQUIS 3 DAYS PRIOR TO SURGERY. YOUR LAST DOSE WILL BE ON 06/08/22.**  As of today, STOP taking any Aspirin (unless otherwise instructed by your surgeon) Aleve, Naproxen, Ibuprofen, Motrin, Advil, Goody's, BC's, all herbal medications, fish oil, and all vitamins.                     Do NOT Smoke (Tobacco/Vaping) for 24 hours prior to your procedure.  If you use a CPAP at night, you may bring your mask/headgear for your overnight stay.   Contacts, glasses, piercing's, hearing aid's, dentures or partials may not be worn into surgery, please bring cases for these belongings.    For patients admitted to the hospital, discharge time will be determined by your treatment team.   Patients discharged the day of surgery will not be allowed to drive home, and someone needs to stay with them for 24 hours.  SURGICAL WAITING ROOM VISITATION Patients having surgery or a procedure may have two support  people in the waiting area. Visitors may stay in the waiting area during the procedure and switch out with other visitors if needed. Only 1 support person is allowed in the pre-op area with the patient AFTER the patient is prepped. This person cannot be switched out. Children under the age of 95 must have an adult accompany them who is not the patient. If the patient needs to stay at the hospital during part of their recovery, the visitor guidelines for inpatient rooms apply.  Please refer to the [facility] website for the visitor guidelines for Inpatients (after your surgery is over and you are in a regular room).    Special instructions:   - Preparing For Surgery  Before surgery, you can play an important role. Because skin is not sterile, your skin needs to be as free of germs as possible. You can reduce the number of germs on your skin by washing with CHG (chlorahexidine gluconate) Soap before surgery.  CHG is an antiseptic cleaner which kills germs and bonds with the skin to continue killing germs even after washing.    Oral Hygiene is also important to reduce your risk of infection.  Remember - BRUSH YOUR TEETH THE MORNING OF SURGERY WITH YOUR REGULAR TOOTHPASTE  Please do not use if you have an allergy to CHG or antibacterial soaps. If your skin becomes reddened/irritated stop using the CHG.  Do not shave (including legs and underarms) for at least 48 hours prior to first CHG  shower. It is OK to shave your face.  Please follow these instructions carefully.   Shower the NIGHT BEFORE SURGERY and the MORNING OF SURGERY  If you chose to wash your hair, wash your hair first as usual with your normal shampoo.  After you shampoo, rinse your hair and body thoroughly to remove the shampoo.  Use CHG Soap as you would any other liquid soap. You can apply CHG directly to the skin and wash gently with a scrungie or a clean washcloth.   Apply the CHG Soap to your body ONLY FROM THE  NECK DOWN.  Do not use on open wounds or open sores. Avoid contact with your eyes, ears, mouth and genitals (private parts). Wash Face and genitals (private parts)  with your normal soap.   Wash thoroughly, paying special attention to the area where your surgery will be performed.  Thoroughly rinse your body with warm water from the neck down.  DO NOT shower/wash with your normal soap after using and rinsing off the CHG Soap.  Pat yourself dry with a CLEAN TOWEL.  Wear CLEAN PAJAMAS to bed the night before surgery  Place CLEAN SHEETS on your bed the night before your surgery  DO NOT SLEEP WITH PETS.   Day of Surgery: Take a shower with CHG soap. Do not wear jewelry or makeup Do not wear lotions, powders, perfumes/colognes, or deodorant. Do not shave 48 hours prior to surgery. Do not wear nail polish, gel polish, artificial nails, or any other type of covering on natural nails (fingers and toes) If you have artificial nails or gel coating that need to be removed by a nail salon, please have this removed prior to surgery. Artificial nails or gel coating may interfere with anesthesia's ability to adequately monitor your vital signs. Wear Clean/Comfortable clothing the morning of surgery Do not bring valuables to the hospital.  [facility] is not responsible for any belongings or valuables. Remember to brush your teeth WITH YOUR REGULAR TOOTHPASTE.   Please read over the following fact sheets that you were given.  If you received a COVID test during your pre-op visit  it is requested that you wear a mask when out in public, stay away from anyone that may not be feeling well and notify your surgeon if you develop symptoms. If you have been in contact with anyone that has tested positive in the last 10 days please notify you surgeon.

## 2022-06-11 LAB — SARS CORONAVIRUS 2 (TAT 6-24 HRS): SARS Coronavirus 2: NEGATIVE

## 2022-06-12 ENCOUNTER — Inpatient Hospital Stay (HOSPITAL_COMMUNITY): Payer: Medicare Other | Admitting: Physician Assistant

## 2022-06-12 ENCOUNTER — Other Ambulatory Visit: Payer: Self-pay

## 2022-06-12 ENCOUNTER — Encounter (HOSPITAL_COMMUNITY): Payer: Self-pay | Admitting: Thoracic Surgery (Cardiothoracic Vascular Surgery)

## 2022-06-12 ENCOUNTER — Encounter (HOSPITAL_COMMUNITY)
Admission: RE | Disposition: A | Discharge status: Home / Self Care | Payer: Self-pay | Source: Home / Self Care | Attending: Thoracic Surgery (Cardiothoracic Vascular Surgery)

## 2022-06-12 ENCOUNTER — Inpatient Hospital Stay (HOSPITAL_COMMUNITY)
Admission: RE | Admit: 2022-06-12 | Discharge: 2022-06-15 | DRG: 328 | Disposition: A | Discharge status: Home / Self Care | Payer: Medicare Other | Attending: Thoracic Surgery (Cardiothoracic Vascular Surgery) | Admitting: Thoracic Surgery (Cardiothoracic Vascular Surgery)

## 2022-06-12 DIAGNOSIS — K449 Diaphragmatic hernia without obstruction or gangrene: Principal | ICD-10-CM | POA: Diagnosis present

## 2022-06-12 DIAGNOSIS — F418 Other specified anxiety disorders: Secondary | ICD-10-CM

## 2022-06-12 DIAGNOSIS — Z1152 Encounter for screening for COVID-19: Secondary | ICD-10-CM | POA: Diagnosis not present

## 2022-06-12 DIAGNOSIS — K219 Gastro-esophageal reflux disease without esophagitis: Secondary | ICD-10-CM | POA: Diagnosis present

## 2022-06-12 DIAGNOSIS — R197 Diarrhea, unspecified: Secondary | ICD-10-CM | POA: Diagnosis present

## 2022-06-12 DIAGNOSIS — Z885 Allergy status to narcotic agent status: Secondary | ICD-10-CM

## 2022-06-12 DIAGNOSIS — I10 Essential (primary) hypertension: Secondary | ICD-10-CM

## 2022-06-12 DIAGNOSIS — Z8719 Personal history of other diseases of the digestive system: Principal | ICD-10-CM

## 2022-06-12 DIAGNOSIS — N289 Disorder of kidney and ureter, unspecified: Secondary | ICD-10-CM

## 2022-06-12 DIAGNOSIS — F419 Anxiety disorder, unspecified: Secondary | ICD-10-CM

## 2022-06-12 DIAGNOSIS — R911 Solitary pulmonary nodule: Secondary | ICD-10-CM | POA: Diagnosis present

## 2022-06-12 DIAGNOSIS — I2699 Other pulmonary embolism without acute cor pulmonale: Secondary | ICD-10-CM

## 2022-06-12 DIAGNOSIS — F32A Depression, unspecified: Secondary | ICD-10-CM

## 2022-06-12 HISTORY — PX: ESOPHAGOGASTRODUODENOSCOPY: SHX5428

## 2022-06-12 HISTORY — DX: Personal history of other diseases of the digestive system: Z87.19

## 2022-06-12 HISTORY — PX: XI ROBOTIC ASSISTED HIATAL HERNIA REPAIR: SHX6889

## 2022-06-12 SURGERY — REPAIR, HERNIA, HIATAL, ROBOT-ASSISTED
Anesthesia: General | Site: Chest

## 2022-06-12 MED ORDER — 0.9 % SODIUM CHLORIDE (POUR BTL) OPTIME
TOPICAL | Status: DC | PRN
  Administered 2022-06-12: 2000 mL

## 2022-06-12 MED ORDER — SUGAMMADEX SODIUM 200 MG/2ML IV SOLN
INTRAVENOUS | Status: DC | PRN
  Administered 2022-06-12: 200 mg via INTRAVENOUS

## 2022-06-12 MED ORDER — ONDANSETRON HCL 4 MG/2ML IJ SOLN
INTRAMUSCULAR | Status: DC | PRN
  Administered 2022-06-12: 4 mg via INTRAVENOUS

## 2022-06-12 MED ORDER — ACETAMINOPHEN 10 MG/ML IV SOLN
INTRAVENOUS | Status: DC | PRN
  Administered 2022-06-12: 1000 mg via INTRAVENOUS

## 2022-06-12 MED ORDER — FENTANYL CITRATE PF 50 MCG/ML IJ SOSY
25.0000 ug | PREFILLED_SYRINGE | INTRAMUSCULAR | Status: DC | PRN
  Administered 2022-06-12 – 2022-06-13 (×2): 25 ug via INTRAVENOUS
  Filled 2022-06-12 (×3): qty 1

## 2022-06-12 MED ORDER — ACETAMINOPHEN 10 MG/ML IV SOLN
INTRAVENOUS | Status: AC
  Filled 2022-06-12: qty 100

## 2022-06-12 MED ORDER — BUPIVACAINE HCL (PF) 0.5 % IJ SOLN
INTRAMUSCULAR | Status: AC
  Filled 2022-06-12: qty 30

## 2022-06-12 MED ORDER — LIDOCAINE 2% (20 MG/ML) 5 ML SYRINGE
INTRAMUSCULAR | Status: DC | PRN
  Administered 2022-06-12: 80 mg via INTRAVENOUS

## 2022-06-12 MED ORDER — PROPOFOL 10 MG/ML IV BOLUS
INTRAVENOUS | Status: DC | PRN
  Administered 2022-06-12: 60 mg via INTRAVENOUS
  Administered 2022-06-12: 140 mg via INTRAVENOUS

## 2022-06-12 MED ORDER — ACETAMINOPHEN 160 MG/5ML PO SOLN: 1000.0000 mg | Freq: Once | ORAL | Status: DC | PRN

## 2022-06-12 MED ORDER — ACETAMINOPHEN 10 MG/ML IV SOLN: 1000.0000 mg | Freq: Once | INTRAVENOUS | Status: DC | PRN

## 2022-06-12 MED ORDER — FENTANYL CITRATE (PF) 250 MCG/5ML IJ SOLN
INTRAMUSCULAR | Status: DC | PRN
  Administered 2022-06-12: 50 ug via INTRAVENOUS
  Administered 2022-06-12: 150 ug via INTRAVENOUS

## 2022-06-12 MED ORDER — ROCURONIUM BROMIDE 10 MG/ML (PF) SYRINGE
PREFILLED_SYRINGE | INTRAVENOUS | Status: DC | PRN
  Administered 2022-06-12: 80 mg via INTRAVENOUS
  Administered 2022-06-12: 20 mg via INTRAVENOUS
  Administered 2022-06-12: 30 mg via INTRAVENOUS

## 2022-06-12 MED ORDER — PHENYLEPHRINE HCL-NACL 20-0.9 MG/250ML-% IV SOLN
INTRAVENOUS | Status: DC | PRN
  Administered 2022-06-12: 15 ug/min via INTRAVENOUS

## 2022-06-12 MED ORDER — DEXAMETHASONE SODIUM PHOSPHATE 10 MG/ML IJ SOLN
INTRAMUSCULAR | Status: AC
  Filled 2022-06-12: qty 1

## 2022-06-12 MED ORDER — ROCURONIUM BROMIDE 10 MG/ML (PF) SYRINGE
PREFILLED_SYRINGE | INTRAVENOUS | Status: AC
  Filled 2022-06-12: qty 20

## 2022-06-12 MED ORDER — KETOROLAC TROMETHAMINE 30 MG/ML IJ SOLN
15.0000 mg | Freq: Four times a day (QID) | INTRAMUSCULAR | Status: DC | PRN
  Administered 2022-06-12 (×2): 15 mg via INTRAVENOUS
  Filled 2022-06-12 (×2): qty 1

## 2022-06-12 MED ORDER — ACETAMINOPHEN 500 MG PO TABS: 1000.0000 mg | ORAL_TABLET | Freq: Once | ORAL | Status: DC | PRN

## 2022-06-12 MED ORDER — PROPOFOL 10 MG/ML IV BOLUS
INTRAVENOUS | Status: AC
  Filled 2022-06-12: qty 20

## 2022-06-12 MED ORDER — MIDAZOLAM HCL 2 MG/2ML IJ SOLN
INTRAMUSCULAR | Status: AC
  Filled 2022-06-12: qty 2

## 2022-06-12 MED ORDER — ONDANSETRON 4 MG PO TBDP: 4.0000 mg | ORAL_TABLET | Freq: Four times a day (QID) | ORAL | Status: DC | PRN

## 2022-06-12 MED ORDER — ORAL CARE MOUTH RINSE: 15.0000 mL | OROMUCOSAL | Status: DC | PRN

## 2022-06-12 MED ORDER — FENTANYL CITRATE (PF) 100 MCG/2ML IJ SOLN: 25.0000 ug | INTRAMUSCULAR | Status: DC | PRN

## 2022-06-12 MED ORDER — CEFAZOLIN SODIUM-DEXTROSE 2-4 GM/100ML-% IV SOLN
2.0000 g | Freq: Three times a day (TID) | INTRAVENOUS | Status: AC
  Administered 2022-06-12: 2 g via INTRAVENOUS
  Filled 2022-06-12: qty 100

## 2022-06-12 MED ORDER — ONDANSETRON HCL 4 MG/2ML IJ SOLN: 4.0000 mg | Freq: Four times a day (QID) | INTRAMUSCULAR | Status: DC | PRN

## 2022-06-12 MED ORDER — OXYCODONE HCL 5 MG PO TABS: 5.0000 mg | ORAL_TABLET | Freq: Once | ORAL | Status: DC | PRN

## 2022-06-12 MED ORDER — DEXAMETHASONE SODIUM PHOSPHATE 10 MG/ML IJ SOLN
INTRAMUSCULAR | Status: DC | PRN
  Administered 2022-06-12: 10 mg via INTRAVENOUS

## 2022-06-12 MED ORDER — BUPIVACAINE LIPOSOME 1.3 % IJ SUSP: INTRAMUSCULAR | Status: DC | PRN

## 2022-06-12 MED ORDER — BUPIVACAINE LIPOSOME 1.3 % IJ SUSP
INTRAMUSCULAR | Status: AC
  Filled 2022-06-12: qty 20

## 2022-06-12 MED ORDER — LACTATED RINGERS IV SOLN: INTRAVENOUS | Status: DC

## 2022-06-12 MED ORDER — HYDRALAZINE HCL 20 MG/ML IJ SOLN: 10.0000 mg | INTRAMUSCULAR | Status: DC | PRN

## 2022-06-12 MED ORDER — CEFAZOLIN SODIUM-DEXTROSE 2-4 GM/100ML-% IV SOLN
2.0000 g | INTRAVENOUS | Status: AC
  Administered 2022-06-12: 2 g via INTRAVENOUS
  Filled 2022-06-12: qty 100

## 2022-06-12 MED ORDER — ENOXAPARIN SODIUM 40 MG/0.4ML IJ SOSY
40.0000 mg | PREFILLED_SYRINGE | INTRAMUSCULAR | Status: DC
  Administered 2022-06-13 – 2022-06-15 (×3): 40 mg via SUBCUTANEOUS
  Filled 2022-06-12 (×3): qty 0.4

## 2022-06-12 MED ORDER — LIDOCAINE 2% (20 MG/ML) 5 ML SYRINGE
INTRAMUSCULAR | Status: AC
  Filled 2022-06-12: qty 5

## 2022-06-12 MED ORDER — ORAL CARE MOUTH RINSE: 15.0000 mL | Freq: Once | OROMUCOSAL | Status: AC

## 2022-06-12 MED ORDER — CHLORHEXIDINE GLUCONATE 0.12 % MT SOLN
15.0000 mL | Freq: Once | OROMUCOSAL | Status: AC
  Administered 2022-06-12: 15 mL via OROMUCOSAL
  Filled 2022-06-12: qty 15

## 2022-06-12 MED ORDER — MIDAZOLAM HCL 2 MG/2ML IJ SOLN
INTRAMUSCULAR | Status: DC | PRN
  Administered 2022-06-12: 2 mg via INTRAVENOUS

## 2022-06-12 MED ORDER — ONDANSETRON HCL 4 MG/2ML IJ SOLN
INTRAMUSCULAR | Status: AC
  Filled 2022-06-12: qty 2

## 2022-06-12 MED ORDER — PANTOPRAZOLE SODIUM 40 MG IV SOLR
40.0000 mg | Freq: Every day | INTRAVENOUS | Status: DC
  Administered 2022-06-12 – 2022-06-13 (×2): 40 mg via INTRAVENOUS
  Filled 2022-06-12 (×2): qty 10

## 2022-06-12 MED ORDER — FENTANYL CITRATE (PF) 250 MCG/5ML IJ SOLN
INTRAMUSCULAR | Status: AC
  Filled 2022-06-12: qty 5

## 2022-06-12 MED ORDER — OXYCODONE HCL 5 MG/5ML PO SOLN: 5.0000 mg | Freq: Once | ORAL | Status: DC | PRN

## 2022-06-12 SURGICAL SUPPLY — 76 items
ADH SKN CLS APL DERMABOND .7 (GAUZE/BANDAGES/DRESSINGS) ×2
BLADE SURG 11 STRL SS (BLADE) ×3 IMPLANT
BUTTON OLYMPUS DEFENDO 5 PIECE (MISCELLANEOUS) ×3 IMPLANT
CANISTER SUCT 3000ML PPV (MISCELLANEOUS) ×6 IMPLANT
CATH THORACIC 28FR (CATHETERS) IMPLANT
CNTNR URN SCR LID CUP LEK RST (MISCELLANEOUS) ×3 IMPLANT
CONT SPEC 4OZ STRL OR WHT (MISCELLANEOUS) ×2
DEFOGGER SCOPE WARMER CLEARIFY (MISCELLANEOUS) ×3 IMPLANT
DERMABOND ADVANCED .7 DNX12 (GAUZE/BANDAGES/DRESSINGS) ×3 IMPLANT
DEVICE SUTURE ENDOST 10MM (ENDOMECHANICALS) IMPLANT
DRAIN PENROSE 1/4X12 LTX STRL (WOUND CARE) IMPLANT
DRAPE ARM DVNC X/XI (DISPOSABLE) ×12 IMPLANT
DRAPE COLUMN DVNC XI (DISPOSABLE) ×3 IMPLANT
DRAPE CV SPLIT W-CLR ANES SCRN (DRAPES) ×3 IMPLANT
DRAPE DA VINCI XI ARM (DISPOSABLE) ×8
DRAPE DA VINCI XI COLUMN (DISPOSABLE) ×2
DRAPE INCISE IOBAN 66X45 STRL (DRAPES) IMPLANT
DRAPE ORTHO SPLIT 77X108 STRL (DRAPES) ×2
DRAPE SURG ORHT 6 SPLT 77X108 (DRAPES) ×3 IMPLANT
ELECT REM PT RETURN 9FT ADLT (ELECTROSURGICAL) ×2
ELECTRODE REM PT RTRN 9FT ADLT (ELECTROSURGICAL) ×3 IMPLANT
FELT TEFLON 1X6 (MISCELLANEOUS) IMPLANT
GAUZE SPONGE 4X4 12PLY STRL (GAUZE/BANDAGES/DRESSINGS) ×3 IMPLANT
GLOVE BIO SURGEON STRL SZ 6.5 (GLOVE) IMPLANT
GLOVE BIO SURGEON STRL SZ7 (GLOVE) ×3 IMPLANT
GLOVE BIO SURGEON STRL SZ7.5 (GLOVE) ×9 IMPLANT
GLOVE BIOGEL PI IND STRL 6.5 (GLOVE) IMPLANT
GOWN STRL REUS W/ TWL LRG LVL3 (GOWN DISPOSABLE) ×3 IMPLANT
GOWN STRL REUS W/ TWL XL LVL3 (GOWN DISPOSABLE) ×6 IMPLANT
GOWN STRL REUS W/TWL 2XL LVL3 (GOWN DISPOSABLE) ×3 IMPLANT
GOWN STRL REUS W/TWL LRG LVL3 (GOWN DISPOSABLE) ×6
GOWN STRL REUS W/TWL XL LVL3 (GOWN DISPOSABLE) ×4
GRASPER SUT TROCAR 14GX15 (MISCELLANEOUS) IMPLANT
IRRIGATOR SUCT 8 DISP DVNC XI (IRRIGATION / IRRIGATOR) IMPLANT
IRRIGATOR SUCTION 8MM XI DISP (IRRIGATION / IRRIGATOR) ×2
IV NS 1000ML (IV SOLUTION)
IV NS 1000ML BAXH (IV SOLUTION) IMPLANT
KIT BASIN OR (CUSTOM PROCEDURE TRAY) ×3 IMPLANT
KIT TURNOVER KIT B (KITS) ×3 IMPLANT
MARKER SKIN DUAL TIP RULER LAB (MISCELLANEOUS) ×3 IMPLANT
NDL HYPO 22X1.5 SAFETY MO (MISCELLANEOUS) IMPLANT
NEEDLE HYPO 22X1.5 SAFETY MO (MISCELLANEOUS) ×2 IMPLANT
NEEDLE SAFETY HYPO 22GAX1.5 (MISCELLANEOUS) ×2
NS IRRIG 1000ML POUR BTL (IV SOLUTION) ×6 IMPLANT
OBTURATOR OPTICAL STANDARD 8MM (TROCAR) ×2
OBTURATOR OPTICAL STND 8 DVNC (TROCAR) ×2
OBTURATOR OPTICALSTD 8 DVNC (TROCAR) ×3 IMPLANT
OIL SILICONE PENTAX (PARTS (SERVICE/REPAIRS)) IMPLANT
PACK CHEST (CUSTOM PROCEDURE TRAY) ×3 IMPLANT
PAD ARMBOARD 7.5X6 YLW CONV (MISCELLANEOUS) ×6 IMPLANT
PORT ACCESS TROCAR AIRSEAL 12 (TROCAR) IMPLANT
SEAL CANN UNIV 5-8 DVNC XI (MISCELLANEOUS) ×12 IMPLANT
SEAL XI 5MM-8MM UNIVERSAL (MISCELLANEOUS) ×8
SEALER SYNCHRO 8 IS4000 DV (MISCELLANEOUS) ×2
SEALER SYNCHRO 8 IS4000 DVNC (MISCELLANEOUS) IMPLANT
SUT ETHIBOND 0 36 GRN (SUTURE) ×6 IMPLANT
SUT SILK  1 MH (SUTURE) ×2
SUT SILK 1 MH (SUTURE) ×3 IMPLANT
SUT SURGIDAC NAB ES-9 0 48 120 (SUTURE) IMPLANT
SUT VIC AB 2-0 CT1 27 (SUTURE) ×2
SUT VIC AB 2-0 CT1 TAPERPNT 27 (SUTURE) ×3 IMPLANT
SUT VIC AB 3-0 SH 27 (SUTURE) ×4
SUT VIC AB 3-0 SH 27X BRD (SUTURE) ×6 IMPLANT
SUT VICRYL 0 UR6 27IN ABS (SUTURE) ×6 IMPLANT
SYR 20ML ECCENTRIC (SYRINGE) ×3 IMPLANT
SYSTEM SAHARA CHEST DRAIN ATS (WOUND CARE) IMPLANT
TOWEL GREEN STERILE (TOWEL DISPOSABLE) ×3 IMPLANT
TOWEL GREEN STERILE FF (TOWEL DISPOSABLE) ×3 IMPLANT
TRAY FOLEY MTR SLVR 16FR STAT (SET/KITS/TRAYS/PACK) ×3 IMPLANT
TROCAR PORT AIRSEAL 8X120 (TROCAR) IMPLANT
TROCAR XCEL BLADELESS 5X75MML (TROCAR) ×3 IMPLANT
TROCAR XCEL NON-BLD 5MMX100MML (ENDOMECHANICALS) IMPLANT
TUBE CONNECTING 20X1/4 (TUBING) ×3 IMPLANT
TUBING ENDO SMARTCAP (MISCELLANEOUS) ×3 IMPLANT
UNDERPAD 30X36 HEAVY ABSORB (UNDERPADS AND DIAPERS) ×3 IMPLANT
WATER STERILE IRR 1000ML POUR (IV SOLUTION) ×3 IMPLANT

## 2022-06-12 NOTE — Plan of Care (Signed)

## 2022-06-12 NOTE — Interval H&P Note (Signed)
History and Physical Interval Note:  06/12/2022 12:18 PM  [name]  has presented today for surgery, with the diagnosis of HIATAL HERNIA.  The various methods of treatment have been discussed with the patient and family. After consideration of risks, benefits and other options for treatment, the patient has consented to  Procedure(s): XI ROBOTIC [place] (N/A) ESOPHAGOGASTRODUODENOSCOPY (EGD) (N/A) as a surgical intervention.  The patient's history has been reviewed, patient examined, no change in status, stable for surgery.  I have reviewed the patient's chart and labs.  Questions were answered to the patient's satisfaction.     Adreana Coull [name]

## 2022-06-12 NOTE — Hospital Course (Addendum)
History of Present Illness:     62 year old female presents in follow-up to review cross-sectional imaging.  She previously underwent a paraesophageal hernia repair in 2022, but has developed new reflux symptoms, and nausea with meals.  She denies any dysphagia.  Dr. Kipp Brood reviewed Ms. Leveque's diagnostic studies and determined surgical intervention would provide her with the best long term treatment. He discussed her treatment options as well as the risks and benefits of surgery. Ms. Briski was agreeable to proceed with surgery.  Hospital Course: Ms. Sherrard arrived at [facility] and was brought to the operating room on 06/12/22. She underwent robotic assisted paraesophageal hernia repair. She tolerated the procedure well and was transferred to the PACU in stable condition. On POD1 she was having uncontrolled pain, she was started on scheduled Toradol, Hycet PRN and Fentanyl PRN for breakthrough pain. She uses albuterol at home and was experiencing shortness of breath, albuterol nebulizer was ordered as needed. She underwent a swallow study which showed no leak. Dysphagia 1 diet was started.

## 2022-06-12 NOTE — Transfer of Care (Signed)
Immediate Anesthesia Transfer of Care Note  Patient: [name]  Procedure(s) Performed: XI ROBOTIC ASSISTED HIATAL HERNIA REPAIR (Chest) ESOPHAGOGASTRODUODENOSCOPY (EGD)  Patient Location: PACU  Anesthesia Type:General  Level of Consciousness: awake, alert , and oriented  Airway & Oxygen Therapy: Patient Spontanous Breathing and Patient connected to nasal cannula oxygen  Post-op Assessment: Report given to RN and Post -op Vital signs reviewed and stable  Post vital signs: Reviewed and stable  Last Vitals:  Vitals Value Taken Time  BP 121/73 06/12/22 1600  Temp 36.7 C 06/12/22 1600  Pulse 77 06/12/22 1605  Resp 17 06/12/22 1605  SpO2 92 % 06/12/22 1605  Vitals shown include unvalidated device data.  Last Pain:  Vitals:   06/12/22 1600  TempSrc:   PainSc: Asleep      Patients Stated Pain Goal: 0 (83/33/83 2919)  Complications: No notable events documented.

## 2022-06-12 NOTE — Anesthesia Procedure Notes (Signed)
Procedure Name: Intubation Date/Time: 06/12/2022 12:47 PM  Performed by: Valda Favia, CRNAPre-anesthesia Checklist: Patient identified, Emergency Drugs available, Suction available, Patient being monitored and Timeout performed Patient Re-evaluated:Patient Re-evaluated prior to induction Oxygen Delivery Method: Circle system utilized Preoxygenation: Pre-oxygenation with 100% oxygen Induction Type: IV induction Ventilation: Mask ventilation without difficulty Laryngoscope Size: Mac and 4 Grade View: Grade II Tube type: Oral Tube size: 8.0 mm Number of attempts: 3 (2 attempts by [place]) Airway Equipment and Method: Stylet Placement Confirmation: ETT inserted through vocal cords under direct vision, positive ETCO2 and breath sounds checked- equal and bilateral Secured at: 20 cm Tube secured with: Tape Dental Injury: Teeth and Oropharynx as per pre-operative assessment  Comments: DLx1 by Vicente Males Dominguez(paramedic student) esophageal intubation, oral tube removed and mask ventilation resumed VSS.  DLx2 by Darnelle Going unable to visualize glottic opening, mask ventilation resumed VSS DLx3 by A. Delano Frate, CRNA Grade II view successful intubation see above note

## 2022-06-12 NOTE — Brief Op Note (Signed)
06/12/2022  3:44 PM  PATIENT:  [name]  62 y.o. female  PRE-OPERATIVE DIAGNOSIS:  HIATAL HERNIA  POST-OPERATIVE DIAGNOSIS:  HIATAL HERNIA  PROCEDURE:  Procedure(s): XI ROBOTIC ASSISTED HIATAL HERNIA REPAIR (N/A) ESOPHAGOGASTRODUODENOSCOPY (EGD) (N/A)  SURGEON:  Surgeon(s) and Role:    * Lightfoot, Lucile Crater, MD - Primary  PHYSICIAN ASSISTANT: Denajah Farias PA-C  ASSISTANTS: none   ANESTHESIA:   general  EBL:  50 ML  BLOOD ADMINISTERED:none  DRAINS: none   LOCAL MEDICATIONS USED:  BUPIVICAINE  and OTHER EXPAREL  SPECIMEN:  No Specimen  DISPOSITION OF SPECIMEN:  N/A  COUNTS:  YES  TOURNIQUET:  * No tourniquets in log *  DICTATION: .Dragon Dictation  PLAN OF CARE: Admit to inpatient   PATIENT DISPOSITION:  PACU - hemodynamically stable.   Delay start of Pharmacological VTE agent (>24hrs) due to surgical blood loss or risk of bleeding: no

## 2022-06-12 NOTE — Anesthesia Preprocedure Evaluation (Signed)
Anesthesia Evaluation  Patient identified by MRN, date of birth, ID band Patient awake    Reviewed: Allergy & Precautions, NPO status , Patient's Chart, lab work & pertinent test results  History of Anesthesia Complications Negative for: history of anesthetic complications  Airway Mallampati: II  TM Distance: >3 FB Neck ROM: Full    Dental  (+) Teeth Intact, Dental Advisory Given   Pulmonary neg pulmonary ROS   breath sounds clear to auscultation       Cardiovascular hypertension, Pt. on medications (-) angina + DVT  (-) Past MI  Rhythm:Regular   1. Left ventricular ejection fraction, by visual estimation, is 60 to  65%. The left ventricle has normal function. Normal left ventricular size.  There is no left ventricular hypertrophy.   2. Global right ventricle has normal systolic function.The right  ventricular size is normal. No increase in right ventricular wall  thickness.   3. Left atrial size was mildly dilated.   4. Right atrial size was normal.   5. The mitral valve is normal in structure. Trace mitral valve  regurgitation. No evidence of mitral stenosis.   6. The tricuspid valve is normal in structure. Tricuspid valve  regurgitation is mild.   7. The aortic valve is normal in structure. Aortic valve regurgitation  was not visualized by color flow Doppler. Structurally normal aortic  valve, with no evidence of sclerosis or stenosis.   8. The pulmonic valve was normal in structure. Pulmonic valve  regurgitation is not visualized by color flow Doppler.   9. Normal pulmonary artery systolic pressure.  10. The inferior vena cava is normal in size with greater than 50%  respiratory variability, suggesting right atrial pressure of 3 mmHg.   FINDINGS     Neuro/Psych  PSYCHIATRIC DISORDERS Anxiety Depression    negative neurological ROS     GI/Hepatic Neg liver ROS, hiatal hernia,GERD  Medicated,,  Endo/Other  negative  endocrine ROS    Renal/GU Renal InsufficiencyRenal diseaseLab Results      Component                Value               Date                      CREATININE               1.30 (H)            06/10/2022           Lab Results      Component                Value               Date                      K                        3.2 (L)             06/10/2022                Musculoskeletal  (+) Arthritis ,  Fibromyalgia -  Abdominal   Peds  Hematology negative hematology ROS (+) Lab Results      Component                Value  Date                      WBC                      9.6                 06/10/2022                HGB                      13.2                06/10/2022                HCT                      40.3                06/10/2022                MCV                      90.4                06/10/2022                PLT                      [id number]                 06/10/2022              Anesthesia Other Findings Day of surgery medications reviewed with the patient.  Right breast abscess  Reproductive/Obstetrics                              Anesthesia Physical Anesthesia Plan  ASA: 3  Anesthesia Plan: General   Post-op Pain Management: Ofirmev IV (intra-op)*   Induction: Intravenous  PONV Risk Score and Plan: 3 and Midazolam, Dexamethasone and Ondansetron  Airway Management Planned: Oral ETT  Additional Equipment: None  Intra-op Plan:   Post-operative Plan: Extubation in OR  Informed Consent: I have reviewed the patients History and Physical, chart, labs and discussed the procedure including the risks, benefits and alternatives for the proposed anesthesia with the patient or authorized representative who has indicated his/her understanding and acceptance.     Dental advisory given  Plan Discussed with: CRNA  Anesthesia Plan Comments:          Anesthesia Quick Evaluation

## 2022-06-12 NOTE — Discharge Instructions (Signed)
Robot-Assisted Thoracic Surgery, Care After The following information offers guidance on how to care for yourself after your procedure. Your health care provider may also give you more specific instructions. If you have problems or questions, contact your health care provider. What can I expect after the procedure? After the procedure, it is common to have: Some pain and aches in the area of your surgical incisions. Pain when breathing in (inhaling) and coughing. Tiredness (fatigue). Trouble sleeping. Constipation. Follow these instructions at home: Medicines Take over-the-counter and prescription medicines only as told by your health care provider. If you were prescribed an antibiotic medicine, take it as told by your health care provider. Do not stop taking the antibiotic even if you start to feel better. Talk with your health care provider about safe and effective ways to manage pain after your procedure. Pain management should fit your specific health needs. Take pain medicine before pain becomes severe. Relieving and controlling your pain will make breathing easier for you. Ask your health care provider if the medicine prescribed to you requires you to avoid driving or using machinery. Eating and drinking Follow instructions from your health care provider about eating or drinking restrictions. These will vary depending on what procedure you had. Your health care provider may recommend: A liquid diet or soft diet for the first few days. Meals that are smaller and more frequent. A diet of fruits, vegetables, whole grains, and low-fat proteins. Limiting foods that are high in fat and processed sugar, including fried or sweet foods. Incision care Follow instructions from your health care provider about how to take care of your incisions. Make sure you: Wash your hands with soap and water for at least 20 seconds before and after you change your bandage (dressing). If soap and water are not  available, use hand sanitizer. Change your dressing as told by your health care provider. Leave stitches (sutures), skin glue, or adhesive strips in place. These skin closures may need to stay in place for 2 weeks or longer. If adhesive strip edges start to loosen and curl up, you may trim the loose edges. Do not remove adhesive strips completely unless your health care provider tells you to do that. Check your incision area every day for signs of infection. Check for: Redness, swelling, or more pain. Fluid or blood. Warmth. Pus or a bad smell. Activity Return to your normal activities as told by your health care provider. Ask your health care provider what activities are safe for you. Ask your health care provider when it is safe for you to drive. Do not lift anything that is heavier than 10 lb (4.5 kg), or the limit that you are told, until your health care provider says that it is safe. Rest as told by your health care provider. Avoid sitting for a long time without moving. Get up to take short walks every 1-2 hours. This is important to improve blood flow and breathing. Ask for help if you feel weak or unsteady. Do exercises as told by your health care provider. Pneumonia prevention  Do deep breathing exercises and cough regularly as directed. This helps clear mucus and opens your lungs. Doing this helps prevent lung infection (pneumonia). If you were given an incentive spirometer, use it as told. An incentive spirometer is a tool that measures how well you are filling your lungs with each breath. Coughing may hurt less if you try to support your chest. This is called splinting. Try one of these when you  cough: Hold a pillow against your chest. Place the palms of both hands on top of your incision area. Do not use any products that contain nicotine or tobacco. These products include cigarettes, chewing tobacco, and vaping devices, such as e-cigarettes. If you need help quitting, ask your  health care provider. Avoid secondhand smoke. General instructions If you have a drainage tube: Follow instructions from your health care provider about how to take care of it. Do not travel by airplane after your tube is removed until your health care provider tells you it is safe. You may need to take these actions to prevent or treat constipation: Drink enough fluid to keep your urine pale yellow. Take over-the-counter or prescription medicines. Eat foods that are high in fiber, such as beans, whole grains, and fresh fruits and vegetables. Limit foods that are high in fat and processed sugars, such as fried or sweet foods. Keep all follow-up visits. This is important. Contact a health care provider if: You have redness, swelling, or more pain around an incision. You have fluid or blood coming from an incision. An incision feels warm to the touch. You have pus or a bad smell coming from an incision. You have a fever. You cannot eat or drink without vomiting. Your pain medicine is not controlling your pain. Get help right away if: You have chest pain. Your heart is beating quickly. You have trouble breathing. You have trouble speaking. You are confused. You feel weak or dizzy, or you faint. These symptoms may represent a serious problem that is an emergency. Do not wait to see if the symptoms will go away. Get medical help right away. Call your local emergency services (911 in the [place] Do not drive yourself to the hospital. Summary Talk with your health care provider about safe and effective ways to manage pain after your procedure. Pain management should fit your specific health needs. Return to your normal activities as told by your health care provider. Ask your health care provider what activities are safe for you. Do deep breathing exercises and cough regularly as directed. This helps to clear mucus and prevent pneumonia. If it hurts to cough, ease pain by holding a pillow  against your chest or by placing the palms of both hands over your incisions. This information is not intended to replace advice given to you by your health care provider. Make sure you discuss any questions you have with your health care provider. Document Revised: 01/20/2020 Document Reviewed: 01/20/2020 Elsevier Patient Education  [place].  Dysphagia Eating Plan, Pureed This eating plan helps people who are not able to bite or chew food. It is also for people who do not have good control of their tongues. Pureed foods are smooth. They are prepared without lumps so that they can be swallowed safely. Work with your health care provider, nutrition and diet specialist (dietitian), or your speech-language pathologist to make sure you are following the eating plan safely and getting all the nutrients you need. What are tips for following this plan? Cooking If a food is not originally a smooth texture, you may be able to eat the food after: Pureeing it. This can be done with a blender. Moistening it. This can be done by adding juice, cooking liquid, gravy, or sauce to a dry food and then pureeing it. For example, you may have bread if you soak it in milk and puree it. If a food is too thin, you may add a commercial thickener,  corn starch, rice cereal, or potato flakes to thicken it. Strain and throw away any excess liquid or liquid that separates from a solid pureed food before eating. Strain lumps, chunks, pulp, and seeds from pureed foods before eating. Reheat foods slowly to prevent a tough crust from forming. Meal planning Eat a variety of foods to get all the nutrients you need. Add dry milk or protein powder to food to increase calories and protein content. Follow your meal plan as told by your dietitian. General information You may eat foods that are soft and have a pudding-like texture. Do not eat foods that you have to chew. If you have to chew the food, then you cannot eat  it. Avoid foods that are hard, dry, sticky, chunky, lumpy, or stringy. Also avoid foods with nuts, seeds, raisins, skins, or pulp. You may be instructed to thicken liquids. Follow your health care provider's instructions about how to do this and to what consistency. The foods you may eat are usually eaten with a spoon. You can use a spoon or fork to test the texture of a food: Using a spoon, you can do a spoon tilt test to check if food holds together on the spoon and is not too firm, too sticky, or too thick. Food should hold its shape on the spoon and slide off easily with almost no food left on the spoon. With food on a fork, the food should sit on a mound or pile on top of the fork and should not seep or drip through the fork prongs continuously. What foods should I eat?  Fruits Pureed fruits such as melons and apples without seeds or pulp. Mashed bananas. Mashed avocado. Fruit juices without pulp or seeds. Vegetables Pureed vegetables. Smooth tomato paste or sauce. Mashed or pureed potatoes without skin. Grains Soft breads, pancakes, [place] toast, muffins, and bread stuffing pureed to a smooth, moist texture, without nuts or seeds. Cooked cereals that have a pudding-like consistency, such as hot wheat cereal or farina. Pureed oatmeal. Pureed, well-cooked pasta and rice. Meats and other proteins Pureed meat, poultry, and fish. Smooth pate or liverwurst. Smooth souffles. Pureed beans such as lentils. Pureed eggs. Smooth nut and seed butters. Pureed tofu. Dairy Yogurt. Milk. Pureed cottage cheese. Nutritional dairy drinks or shakes. Cream cheese. Smooth pudding, ice cream, sherbet, and malts. Fats and oils Butter. Margarine. Vegetable oils. Smooth and strained gravy. Sour cream. Mayonnaise. Smooth sauces such as white sauce, cheese sauce, or hollandaise sauce. Sweets and desserts Moistened and pureed cookies and cakes. Whipped topping. Gelatin. Pudding pops. Seasonings and other  foods Finely ground spices. Jelly. Honey. Pureed casseroles. Strained soups. Pureed sandwiches. Beverages Anything prepared at the consistency recommended by your health care provider. The items listed above may not be a complete list of foods and beverages you can eat. Contact a dietitian for more information. What foods should I avoid? Fruits Whole fresh, frozen, canned, or dried fruits that have not been pureed. Stringy fruits, such as pineapple or coconut. Watermelon with seeds. Dried fruit or fruit leather. Vegetables Whole vegetables. Stringy vegetables such as celery. Tomatoes or tomato sauce with seeds. Fried vegetables. Grains Oatmeal. Dry cereals. Hard breads. Breads with seeds or nuts. Whole pasta, rice, or other grains. Whole pancakes, waffles, biscuits, muffins, or rolls. Meats and other proteins Whole or ground meat, fish, or poultry. Dried or cooked lentils or legumes that have been cooked but not mashed or pureed. Non-pureed eggs. Nuts and seeds. Crunchy peanut butter. Whole tofu  or other meat alternatives. Dairy Cheese cubes or slices. Non-pureed cottage cheese. Yogurt with fruit chunks. Fats and oils All fats and sauces that have lumps or chunks. Sweets and desserts Solid desserts. Sticky, chewy sweets such as licorice and caramel. Candy with nuts or coconut. Seasonings and other foods Coarse or seeded herbs and spices. Chunky preserves. Jams with seeds. Whole sandwiches. Non-pureed casseroles. Chunky soups. The items listed above may not be a complete list of foods and beverages you should avoid. Contact a dietitian for more information. Summary Pureed foods can be helpful for those who have difficulty chewing or problems controlling or moving food with their tongues. On this dysphagia eating plan, you may eat foods that are soft and have a pudding-like texture. Do not eat foods that you have to chew. If you have to chew the food, then you cannot eat it. You may be  instructed to thicken liquids. Follow your health care provider's instructions about how to do this and to what consistency. This information is not intended to replace advice given to you by your health care provider. Make sure you discuss any questions you have with your health care provider. Document Revised: 06/20/2021 Document Reviewed: 06/20/2021 Elsevier Patient Education  [place].  If any questions or concerns arise, please do not hesitate to contact our office at [phone]

## 2022-06-12 NOTE — Op Note (Signed)
[address]       [place] [address]             [phone]        06/12/2022  Patient:  [name]: Recurrent hiatal hernia   Post-op Baker:  same Procedure: - Esophagoscopy - Robotic assisted laparoscopy - Paraesophageal hernia repair - Gastropexy   Surgeon and Role:      * Lajuana Matte, MD - Primary  Assistant: Evonnie Pat, PA-C  An experienced assistant was required given the complexity of this surgery and the standard of surgical care. The assistant was needed for exposure, dissection, suctioning, retraction of delicate tissues and sutures, instrument exchange and for overall help during this procedure.   Anesthesia  general EBL:  159m Blood Administration: none Specimen:  none   Counts: correct   Indications: 62year-old female with a recurrent small hiatal hernia however she is symptomatic from this. She also has a 7 mm pulmonary nodule which has been stable. Regards to her hiatal hernia she would like this repaired. She is tentatively scheduled for early in February for this. Regards to the pulmonary nodule made a referral to pulmonary medicine for further evaluation of this.   Findings: Recurrence in her hiatal hernia.  It appears as though the crural stitches pulled through.  There is only stomach hiatus.  We were able to mobilize the esophagus, and perform the hiatal repair with felt pledgets.  There was no obvious mucosal injury on endoscopy at completion of the case.  Operative Technique: After the risks, benefits and alternatives were thoroughly discussed, the patient was brought to the operative theatre.  Anesthesia was induced, and the esophagoscope was passed through the oropharynx down to the stomach.  The scope was retroflexed and the hiatal hernia was clearly evident.  The scope was pulled back and the mucosal surface of the esophagus was visualized.    The scope was then parked at 25 cm from the incisors.  The patient  was then prepped and draped in normal sterile fashion.  An appropriate surgical pause was performed, and pre-operative antibiotics were dosed accordingly.  We began with a 1 cm incision 15 cm caudad from the xiphoid and slightly lateral to the umbilicus.  Using an Optiview we entered the peritoneal space.  The abdomen was then insufflated with CO2.  3 other robotic ports were placed to triangulate the hiatus.  Another 12 mm port was placed in place at the level of the umbilicus laterally for an assistant port and another 5 mm trocar was placed in the right lower quadrant for liver retractor.  The patient was then placed in steep reverse Trendelenburg and the liver was elevated to expose the esophageal hiatus.  And then the robot was docked.  We began by dividing the gastrohepatic ligament to expose the right diaphragmatic crus and then dissected the hernia sac in a clockwise fashion to mobilize there the stomach and esophagus.  We then divided the short gastrics and moved towards the right crus and completed our dissection along the esophageal hiatus.  A Penrose drain was then used to encircle the the esophagus and we continued our dissection up into the mediastinum.  Once we had achieved 3 to 4 cm of intra-abdominal esophagus we then proceeded to reapproximate the crura with 0 Ethibond sutures in an interrupted fashion.  Pledgets were used to reapproximate the crura.  The gastroscope was passed down through the lower esophageal sphincter  into the stomach and would act as our bougie during this repair.  An air leak test was performed using the gastroscope.  No leak was evident.  The stomach was intact to the anterior abdominal wall.  The liver retractor was removed and all ports were removed under direct visualization.  The skin and soft tissue were closed with absorbable suture    The patient tolerated the procedure without any immediate complications, and was transferred to the PACU in stable  condition.  [name]

## 2022-06-13 ENCOUNTER — Encounter (HOSPITAL_COMMUNITY): Payer: Self-pay | Admitting: Thoracic Surgery (Cardiothoracic Vascular Surgery)

## 2022-06-13 ENCOUNTER — Inpatient Hospital Stay (HOSPITAL_COMMUNITY): Payer: Medicare Other

## 2022-06-13 LAB — BASIC METABOLIC PANEL
Anion gap: 13 (ref 5–15)
BUN: 23 mg/dL — ABNORMAL HIGH (ref 6–20)
CO2: 24 mmol/L (ref 22–32)
Calcium: 9.4 mg/dL (ref 8.9–10.3)
Chloride: 97 mmol/L — ABNORMAL LOW (ref 98–111)
Creatinine, Ser: 1.26 mg/dL — ABNORMAL HIGH (ref 0.44–1.00)
GFR, Estimated: 49 mL/min — ABNORMAL LOW (ref >60)
Glucose, Bld: 141 mg/dL — ABNORMAL HIGH (ref 70–99)
Potassium: 3.8 mmol/L (ref 3.5–5.1)
Sodium: 134 mmol/L — ABNORMAL LOW (ref 135–145)

## 2022-06-13 LAB — CBC
HCT: 38.9 % (ref 36.0–46.0)
Hemoglobin: 12.9 g/dL (ref 12.0–15.0)
MCH: 29.5 pg (ref 26.0–34.0)
MCHC: 33.2 g/dL (ref 30.0–36.0)
MCV: 88.8 fL (ref 80.0–100.0)
Platelets: 322 10*3/uL (ref 150–400)
RBC: 4.38 MIL/uL (ref 3.87–5.11)
RDW: 13.8 % (ref 11.5–15.5)
WBC: 10.8 10*3/uL — ABNORMAL HIGH (ref 4.0–10.5)
nRBC: 0 % (ref 0.0–0.2)

## 2022-06-13 MED ORDER — HYDROCODONE-ACETAMINOPHEN 7.5-325 MG/15ML PO SOLN
10.0000 mL | Freq: Four times a day (QID) | ORAL | Status: DC | PRN
  Administered 2022-06-13 – 2022-06-14 (×4): 10 mL via ORAL
  Filled 2022-06-13 (×4): qty 15

## 2022-06-13 MED ORDER — KETOROLAC TROMETHAMINE 30 MG/ML IJ SOLN
15.0000 mg | Freq: Four times a day (QID) | INTRAMUSCULAR | Status: DC
  Administered 2022-06-13 – 2022-06-14 (×4): 15 mg via INTRAVENOUS
  Filled 2022-06-13 (×4): qty 1

## 2022-06-13 MED ORDER — HYDROCHLOROTHIAZIDE 50 MG PO TABS
50.0000 mg | ORAL_TABLET | Freq: Every day | ORAL | Status: DC
  Administered 2022-06-14 – 2022-06-15 (×2): 50 mg via ORAL
  Filled 2022-06-13 (×2): qty 1

## 2022-06-13 MED ORDER — ALBUTEROL SULFATE (2.5 MG/3ML) 0.083% IN NEBU: 2.5000 mg | INHALATION_SOLUTION | Freq: Four times a day (QID) | RESPIRATORY_TRACT | Status: DC | PRN

## 2022-06-13 MED ORDER — IOHEXOL 300 MG/ML  SOLN
100.0000 mL | Freq: Once | INTRAMUSCULAR | Status: AC | PRN
  Administered 2022-06-13: 200 mL via ORAL

## 2022-06-13 NOTE — Progress Notes (Addendum)
      [address]       [place] [address]             [phone]      1 Day Post-Op Procedure(s) (LRB): XI ROBOTIC ASSISTED HIATAL HERNIA REPAIR (N/A) ESOPHAGOGASTRODUODENOSCOPY (EGD) (N/A) Subjective: Patient complaining of severe pain this AM and is out of breath easily.   Objective: Vital signs in last 24 hours: Temp:  [97.5 F (36.4 C)-98.5 F (36.9 C)] 98.5 F (36.9 C) (02/02 0312) Pulse Rate:  [59-86] 64 (02/02 0312) Cardiac Rhythm: Normal sinus rhythm (02/01 1700) Resp:  [10-20] 10 (02/02 0312) BP: (109-132)/(56-87) 114/66 (02/02 0312) SpO2:  [92 %-98 %] 92 % (02/02 0312) Weight:  [95.3 kg-102.4 kg] 102.4 kg (02/01 1747)  Hemodynamic parameters for last 24 hours:    Intake/Output from previous day: 02/01 0701 - 02/02 0700 In: 1900 [I.V.:1700; IV Piggyback:200] Out: 190 [Urine:140; Blood:50] Intake/Output this shift: No intake/output data recorded.  General appearance: alert, cooperative, and no distress Neurologic: intact Heart: regular rate and rhythm, S1, S2 normal, no murmur, click, rub or gallop Lungs: clear to auscultation bilaterally Abdomen: Hypoactive bowel sounds, tender to generalized palpation, no point tenderness, no distension Extremities: edema trace Wound: Clean and dry, no sign of infection  Lab Results: Recent Labs    06/10/22 1556 06/13/22 0212  WBC 9.6 10.8*  HGB 13.2 12.9  HCT 40.3 38.9  PLT 367 322   BMET:  Recent Labs    06/10/22 1556 06/13/22 0212  NA 134* 134*  K 3.2* 3.8  CL 98 97*  CO2 29 24  GLUCOSE 116* 141*  BUN 17 23*  CREATININE 1.30* 1.26*  CALCIUM 9.7 9.4    PT/INR:  Recent Labs    06/10/22 1556  LABPROT 13.7  INR 1.1   ABG    Component Value Date/Time   PHART 7.445 12/14/2020 1018   HCO3 24.9 12/14/2020 1018   TCO2 25 01/27/2018 0814   O2SAT 96.8 12/14/2020 1018   CBG (last 3)  No results for input(s): "GLUCAP" in the last 72 hours.  Assessment/Plan: S/P Procedure(s)  (LRB): XI ROBOTIC ASSISTED HIATAL HERNIA REPAIR (N/A) ESOPHAGOGASTRODUODENOSCOPY (EGD) (N/A)  Neuro: Uncontrolled pain this AM. Start scheduled Toradol, Hycet PRN and Fentanyl PRN for breakthrough pain.  CV: Stable hemodynamics. NSR-SB, SBP 114. No monitoring currently, will start cardiac monitoring.   Pulm: Patient out of breath easily. O2 saturations 96% on RA. Takes inhaler at home, will order albuterol PRN.  GI: NPO until swallow study today. If clear, will start dysphagia 1 diet and crushed meds.   Endo: Preop A1C 5.8. No CBGs.   Renal: Cr 1.26, UO 140cc unsure if all recorded.   Dispo: Improve pain control. Swallow study today. Hopefully home tomorrow.    LOS: 1 day    Magdalene River, PA-C 06/13/2022  Some pain overnight Swallow study today. Dispo planning for tomorrow.  Ceanna Wareing [name]

## 2022-06-13 NOTE — Discharge Summary (Signed)
Physician Discharge Summary  Patient ID: [name] MRN: [id number] DOB/AGE: 10/01/1960 62 y.o.  Admit date: 06/12/2022 Discharge date: 06/15/2022  Admission Diagnoses: Hiatal hernia  Discharge Diagnoses:  Principal Problem:   H/O hiatal hernia S/P robotic assisted paraesophageal hernia repair  Discharged Condition: good  History of Present Illness:     62 year old female presents in follow-up to review cross-sectional imaging.  She previously underwent a paraesophageal hernia repair in 2022, but has developed new reflux symptoms, and nausea with meals.  She denies any dysphagia.  Dr. Kipp Brood reviewed [name]'s diagnostic studies and determined surgical intervention would provide her with the best long term treatment. He discussed her treatment options as well as the risks and benefits of surgery. [name] was agreeable to proceed with surgery.  Hospital Course: [name] arrived at [facility] and was brought to the operating room on 06/12/22. She underwent robotic assisted paraesophageal hernia repair. She tolerated the procedure well and was transferred to the PACU in stable condition. On POD1 she was having uncontrolled pain, she was started on scheduled Toradol, Hycet PRN and Fentanyl PRN for breakthrough pain. She uses albuterol at home and was experiencing shortness of breath, albuterol nebulizer was ordered as needed. She underwent a swallow study which showed no leak. Dysphagia 1 diet was started.  She tolerated the diet without N/V.  She would however experience patient with oral intake.  Her PPI regimen was adjusted and muscle relaxants were initiated.  She was having issues with diarrhea, which was treated with prn immodium.  The patient's diarrhea resolved.  Her discomfort with eating much improved and was mainly now with swallowing.  She ambulated independently.  Her surgical incisions are healing without evidence of infection.  She is medically stable for discharge home  today.  Consults: None  Significant Diagnostic Studies:   CLINICAL DATA:  Provided history: Hiatal hernia with gastroesophageal reflux. Additional history obtained from the [place] assisted laparoscopy and paraesophageal hernia repair with gastropexy 06/12/2022.   EXAM: ESOPHAGUS/BARIUM SWALLOW/TABLET STUDY   TECHNIQUE: A problem-oriented water-soluble contrast esophagram was performed to assess for leak or obstruction status post paraesophageal hernia repair. The exam was performed by Soyla Dryer, NP and with supervised interpreted by Dr. Kellie Simmering.   FLUOROSCOPY: Fluoroscopy time: 1 minute, 42 seconds (39.60 mGy).   COMPARISON:  Prior esophograms 03/28/2022 and earlier.   FINDINGS: A problem-oriented water-soluble contrast esophagram was performed to assess for leak or obstruction status post paraesophageal hernia repair.   Postsurgical appearance at the level of the distal esophagus, GE junction and proximal stomach consistent with the provided history of paraesophageal hernia repair.   Prompt passage of contrast from the distal esophagus into the stomach.   No extraluminal contrast demonstrated to suggest a leak.   No gastroesophageal reflux was observed.   IMPRESSION: 1. Problem-oriented water-soluble contrast esophagram performed to assess for leak or obstruction status post paraesophageal hernia repair. 2. Prompt passage of contrast from the distal esophagus into the stomach. 3. No extraluminal contrast demonstrated to suggest a leak.     Electronically Signed   By: Kellie Simmering D.O.   On: 06/13/2022 10:21   Treatments: surgery: 06/12/2022   Patient:  [name] Pre-Op Dx: Recurrent hiatal hernia   Post-op Dx:  same Procedure: - Esophagoscopy - Robotic assisted laparoscopy - Paraesophageal hernia repair - Gastropexy     Surgeon and Role:      * Lajuana Matte, MD - Primary  PATHOLOGY:  Discharge  Exam: Blood pressure (!) 140/80, pulse 69, temperature 98 F (36.7 C), temperature source Oral, resp. rate 20, height '5\' 3"'$  (1.6 m), weight 102.4 kg, last menstrual period 02/07/2013, SpO2 92 %.  General appearance: alert, cooperative, and no distress Heart: regular rate and rhythm Lungs: clear to auscultation bilaterally Abdomen: soft, non-tender; bowel sounds normal; no masses,  no organomegaly Extremities: extremities normal, atraumatic, no cyanosis or edema Wound: clean and dry   Discharge disposition: 01-Home or Self Care   Allergies as of 06/15/2022       Reactions   Tramadol Itching   "bugs crawling all over"        Medication List     STOP taking these medications    acetaminophen-codeine 300-30 MG tablet Commonly known as: TYLENOL #3   letrozole 2.5 MG tablet Commonly known as: FEMARA       TAKE these medications    acetaminophen 325 MG tablet Commonly known as: TYLENOL Take 2 tablets (650 mg total) by mouth every 6 (six) hours as needed for mild pain (or Fever >/= 101). Crush tablet before taking What changed: additional instructions   albuterol 108 (90 Base) MCG/ACT inhaler Commonly known as: VENTOLIN HFA Inhale 2 puffs into the lungs every 6 (six) hours as needed for wheezing or shortness of breath.   amLODipine 5 MG tablet Commonly known as: NORVASC Take 1 tablet (5 mg total) by mouth daily.   apixaban 5 MG Tabs tablet Commonly known as: Eliquis Take 1 tablet (5 mg total) by mouth 2 (two) times daily. Crush tablet before taking What changed: additional instructions   cloNIDine 0.1 MG tablet Commonly known as: CATAPRES Take 1 tablet (0.1 mg total) by mouth 2 (two) times daily.   dextromethorphan-guaiFENesin 30-600 MG 12hr tablet Commonly known as: MUCINEX DM Take 1 tablet by mouth 2 (two) times daily. Crush tablet before taking What changed: additional instructions   FLUoxetine 40 MG capsule Commonly known as: PROZAC Take 1 capsule (40 mg  total) by mouth daily. Crush tablet before taking What changed: additional instructions   hydrochlorothiazide 50 MG tablet Commonly known as: HYDRODIURIL Take 1 tablet (50 mg total) by mouth daily. Crush tablet before taking What changed: additional instructions   HYDROcodone-acetaminophen 7.5-325 MG tablet Commonly known as: Norco Take 1 tablet by mouth every 6 (six) hours as needed for moderate pain.   loperamide 2 MG capsule Commonly known as: IMODIUM Take 1-2 capsules (2-4 mg total) by mouth as needed for diarrhea or loose stools.   methocarbamol 750 MG tablet Commonly known as: Robaxin-750 Take 1 tablet (750 mg total) by mouth 2 (two) times daily as needed for muscle spasms. Crush tablet before taking What changed: additional instructions   metoprolol succinate 25 MG 24 hr tablet Commonly known as: TOPROL-XL Take 1 tablet (25 mg total) by mouth daily. Crush tablet before taking What changed: additional instructions   omeprazole 20 MG tablet Commonly known as: PriLOSEC OTC Take 1 tablet (20 mg total) by mouth daily. Crush tablet before taking What changed: additional instructions   ondansetron 4 MG disintegrating tablet Commonly known as: ZOFRAN-ODT Take 1 tablet (4 mg total) by mouth every 6 (six) hours as needed for nausea. What changed:  when to take this reasons to take this        Follow-up Information     Lajuana Matte, MD Follow up on 06/18/2022.   Specialty: Cardiothoracic Surgery Why: Virtual follow up appointment with Dr. Kipp Brood at [facility]. Do NOT  come to the office as this is a VIRTUAL appointment, Dr. Kipp Brood will call you. Contact information: [place] [address] [phone]                 Signed: Ellwood Handler, PA-C  06/15/2022, 7:57 AM

## 2022-06-13 NOTE — TOC Initial Note (Signed)
Transition of Care [facility]) - Initial/Assessment Note    Patient Details  Name: [name] MRN: [id number] Date of Birth: Jul 12, 1960  Transition of Care [facility]) CM/SW Contact:    Verdell Carmine, RN Phone Number: 06/13/2022, 5:01 PM  Clinical Narrative:                    Patient here for Hiatal hernia repair. TOC has reviewed the patient and found no needs. The patient will be discussed in daily progressive rounds. If a needs is identified, please place a TOC consult.     Patient Goals and CMS Choice            Expected Discharge Plan and Services                                              Prior Living Arrangements/Services                       Activities of Daily Living Home Assistive Devices/Equipment: Eyeglasses ADL Screening (condition at time of admission) Patient's cognitive ability adequate to safely complete daily activities?: Yes Is the patient deaf or have difficulty hearing?: No Does the patient have difficulty seeing, even when wearing glasses/contacts?: No Does the patient have difficulty concentrating, remembering, or making decisions?: No Patient able to express need for assistance with ADLs?: Yes Does the patient have difficulty dressing or bathing?: No Independently performs ADLs?: Yes (appropriate for developmental age) Does the patient have difficulty walking or climbing stairs?: No Weakness of Legs: Both Weakness of Arms/Hands: None  Permission Sought/Granted                  Emotional Assessment              Admission diagnosis:  H/O hiatal hernia [Z87.19] Patient Active Problem List   Diagnosis Date Noted   H/O hiatal hernia 06/12/2022   Cellulitis of right breast 10/31/2021   Breast infection 09/23/2021   Genetic testing 04/10/2021   History of depression 03/27/2021   Easy bruising 03/27/2021   Bleeds easily ([place]) 03/27/2021   Malignant neoplasm of upper-outer quadrant of right breast in female,  estrogen receptor positive ([place]) 03/26/2021   S/P repair of paraesophageal hernia 12/17/2020   Anxiety 11/02/2019   B12 deficiency 11/02/2019   Adjustment disorder with mixed anxiety and depressed mood 08/22/2019   Iron deficiency anemia 08/22/2019   Gastroesophageal reflux disease 08/22/2019   Chronic joint pain 08/22/2019   Stage 3 chronic kidney disease ([place]) 08/22/2019   Obesity (BMI 35.0-39.9 without comorbidity) 02/28/2019   Normocytic anemia, not due to blood loss 02/28/2019   Restless legs syndrome (RLS) 06/30/2018   Arthrosis of first carpometacarpal joint 02/11/2018   Primary osteoarthritis of first carpometacarpal joint of left hand    Pain in left hand 12/23/2017   Healthcare maintenance 11/16/2017   Tinea versicolor 11/16/2017   Chronic right shoulder pain 09/02/2017   Fibromyalgia 08/11/2017   Family history of diabetes mellitus 06/23/2016   Other fatigue 06/23/2016   Insomnia 06/23/2016   Rheumatoid arthritis ([place]) 06/23/2016   Status post right partial knee replacement 09/15/2014   Status post total knee replacement using cement 06/08/2014   Screening for colon cancer    Schatzki's ring    Hiatal hernia    Dysphagia 05/26/2014   Encounter for screening colonoscopy  05/26/2014   Adrenal adenoma 05/01/2014   Breast lump 05/01/2014   Metatarsalgia of both feet 01/12/2014   Bilateral leg pain 01/11/2014   Bilateral edema of lower extremity 01/11/2014   Other osteoarthritis of spine, thoracolumbar region 12/26/2013   Hypercholesteremia 05/19/2013   Periodic health assessment, general screening, adult 04/13/2013   GERD (gastroesophageal reflux disease) 04/13/2013   Essential hypertension 04/13/2013   Dyspnea 04/13/2013   PCP:  Lorrene Reid, PA-C Pharmacy:   [facility] [place], [place] [address] Phone: [phone] Fax: [phone]  [facility] [address] [place] [address] Phone: [phone] Fax: [place], [place], [place] - [address] [place] [address] Phone: [phone] Fax: [place], [place] [place] [address] [place] [address] Phone: [phone] Fax: [phone]     Social Determinants of Health (SDOH) Social History: SDOH Screenings   Food Insecurity: No Food Insecurity (06/12/2022)  Housing: Low Risk  (06/12/2022)  Transportation Needs: No Transportation Needs (06/12/2022)  Utilities: Not At Risk (06/12/2022)  Depression (PHQ2-9): Medium Risk (02/20/2022)  Physical Activity: Sufficiently Active (07/16/2017)  Social Connections: Moderately Isolated (07/16/2017)  Stress: Stress Concern Present (07/16/2017)  Tobacco Use: Low Risk  (06/13/2022)   SDOH Interventions:     Readmission Risk Interventions     No data to display

## 2022-06-14 LAB — BASIC METABOLIC PANEL
Anion gap: 10 (ref 5–15)
BUN: 32 mg/dL — ABNORMAL HIGH (ref 6–20)
CO2: 27 mmol/L (ref 22–32)
Calcium: 9.4 mg/dL (ref 8.9–10.3)
Chloride: 97 mmol/L — ABNORMAL LOW (ref 98–111)
Creatinine, Ser: 1.31 mg/dL — ABNORMAL HIGH (ref 0.44–1.00)
GFR, Estimated: 47 mL/min — ABNORMAL LOW (ref >60)
Glucose, Bld: 119 mg/dL — ABNORMAL HIGH (ref 70–99)
Potassium: 3.4 mmol/L — ABNORMAL LOW (ref 3.5–5.1)
Sodium: 134 mmol/L — ABNORMAL LOW (ref 135–145)

## 2022-06-14 MED ORDER — HYDROCODONE-ACETAMINOPHEN 7.5-325 MG/15ML PO SOLN
10.0000 mL | ORAL | Status: DC | PRN
  Administered 2022-06-14 – 2022-06-15 (×3): 10 mL via ORAL
  Filled 2022-06-14 (×3): qty 15

## 2022-06-14 MED ORDER — FLUOXETINE HCL 20 MG PO CAPS
40.0000 mg | ORAL_CAPSULE | Freq: Every day | ORAL | Status: DC
  Administered 2022-06-14 – 2022-06-15 (×2): 40 mg via ORAL
  Filled 2022-06-14 (×3): qty 2

## 2022-06-14 MED ORDER — OMEPRAZOLE 20 MG PO CPDR
20.0000 mg | DELAYED_RELEASE_CAPSULE | Freq: Two times a day (BID) | ORAL | Status: DC
  Administered 2022-06-14 – 2022-06-15 (×3): 20 mg via ORAL
  Filled 2022-06-14 (×3): qty 1

## 2022-06-14 MED ORDER — AMLODIPINE BESYLATE 5 MG PO TABS
5.0000 mg | ORAL_TABLET | Freq: Every day | ORAL | Status: DC
  Administered 2022-06-14 – 2022-06-15 (×2): 5 mg via ORAL
  Filled 2022-06-14 (×2): qty 1

## 2022-06-14 MED ORDER — METHOCARBAMOL 500 MG PO TABS
750.0000 mg | ORAL_TABLET | Freq: Three times a day (TID) | ORAL | Status: DC | PRN
  Administered 2022-06-14 (×2): 750 mg via ORAL
  Filled 2022-06-14 (×2): qty 2

## 2022-06-14 MED ORDER — LOPERAMIDE HCL 2 MG PO CAPS
2.0000 mg | ORAL_CAPSULE | ORAL | Status: DC | PRN
  Administered 2022-06-14: 4 mg via ORAL
  Administered 2022-06-14: 2 mg via ORAL
  Filled 2022-06-14 (×2): qty 2

## 2022-06-14 NOTE — Progress Notes (Signed)
      [address]       [place] [address]             [phone]      2 Days Post-Op Procedure(s) (LRB): XI ROBOTIC ASSISTED HIATAL HERNIA REPAIR (N/A) ESOPHAGOGASTRODUODENOSCOPY (EGD) (N/A)  Subjective:  Patient continues to complain of pain with eating. Denies N/V.  She has had multiple episodes of diarrhea.  She really thinks she should stay another day.  Objective: Vital signs in last 24 hours: Temp:  [97.7 F (36.5 C)-98.3 F (36.8 C)] 98 F (36.7 C) (02/03 0408) Pulse Rate:  [57-70] 70 (02/03 0408) Cardiac Rhythm: Normal sinus rhythm (02/02 1940) Resp:  [12-20] 20 (02/03 0408) BP: (115-137)/(65-77) 132/77 (02/03 0408) SpO2:  [92 %-95 %] 92 % (02/03 0408)  Intake/Output from previous day: 02/02 0701 - 02/03 0700 In: 300 [P.O.:300] Out: -   General appearance: alert, cooperative, and no distress Heart: regular rate and rhythm Lungs: clear to auscultation bilaterally Abdomen: soft, non-tender; bowel sounds normal; no masses,  no organomegaly Extremities: extremities normal, atraumatic, no cyanosis or edema Wound: clean and dry  Lab Results: Recent Labs    06/13/22 0212  WBC 10.8*  HGB 12.9  HCT 38.9  PLT 322   BMET:  Recent Labs    06/13/22 0212 06/14/22 0112  NA 134* 134*  K 3.8 3.4*  CL 97* 97*  CO2 24 27  GLUCOSE 141* 119*  BUN 23* 32*  CREATININE 1.26* 1.31*  CALCIUM 9.4 9.4    PT/INR: No results for input(s): "LABPROT", "INR" in the last 72 hours. ABG    Component Value Date/Time   PHART 7.445 12/14/2020 1018   HCO3 24.9 12/14/2020 1018   TCO2 25 01/27/2018 0814   O2SAT 96.8 12/14/2020 1018   CBG (last 3)  No results for input(s): "GLUCAP" in the last 72 hours.  Assessment/Plan: S/P Procedure(s) (LRB): XI ROBOTIC ASSISTED HIATAL HERNIA REPAIR (N/A) ESOPHAGOGASTRODUODENOSCOPY (EGD) (N/A)  CV- NSR, H/O HTN- BP is slowing increase- resume home Norvasc Pulm- no acute issues, off oxygen GI- pain with eating, will  change protonix to home Omeprazole BID, diarrhea will add prn Immodium Pain control- stop all IV pain medications, continue Hycet, will resume home Robaxin Dispo- patient with continued pain, multiple episodes of diarrhea.. will resume home muscle relaxants.Marland Kitchen add prn immodium for diarrhea, try Omeprazole prn as I wonder if pain could be spasmodic with ingestion, observe patient today if remains clinically stable for d/c in AM   LOS: 2 days    Ellwood Handler, PA-C 06/14/2022

## 2022-06-14 NOTE — Progress Notes (Signed)
Mobility Specialist Progress Note:   06/14/22 1344  Mobility  Activity Ambulated independently in hallway  Level of Assistance Independent  Assistive Device None  Distance Ambulated (ft) 500 ft  Activity Response Tolerated well  Mobility Referral Yes  $Mobility charge 1 Mobility   Pre-mobility: 80 HR, 96% SpO2 During mobility:102 HR, 92%SpO2 Post-mobility: 73 HR, 96%SpO2  Pt received in bed and agreeable. No complaints. Pt returned to bed with all needs met and call bell in reach.   Andrey Campanile Mobility Specialist Please contact via SecureChat or  Rehab office at [phone]

## 2022-06-15 ENCOUNTER — Other Ambulatory Visit: Payer: Self-pay | Admitting: Nurse Practitioner

## 2022-06-15 DIAGNOSIS — I2699 Other pulmonary embolism without acute cor pulmonale: Secondary | ICD-10-CM

## 2022-06-15 MED ORDER — FLUOXETINE HCL 40 MG PO CAPS: 40.0000 mg | ORAL_CAPSULE | Freq: Every day | ORAL | 1 refills | Status: DC

## 2022-06-15 MED ORDER — HYDROCODONE-ACETAMINOPHEN 7.5-325 MG PO TABS: 1.0000 | ORAL_TABLET | Freq: Four times a day (QID) | ORAL | 0 refills | Status: DC | PRN

## 2022-06-15 MED ORDER — DM-GUAIFENESIN ER 30-600 MG PO TB12: 1.0000 | ORAL_TABLET | Freq: Two times a day (BID) | ORAL | 0 refills | Status: DC

## 2022-06-15 MED ORDER — METHOCARBAMOL 750 MG PO TABS: 750.0000 mg | ORAL_TABLET | Freq: Two times a day (BID) | ORAL | 0 refills | Status: DC | PRN

## 2022-06-15 MED ORDER — LOPERAMIDE HCL 2 MG PO CAPS: 2.0000 mg | ORAL_CAPSULE | ORAL | 0 refills | Status: DC | PRN

## 2022-06-15 MED ORDER — OMEPRAZOLE MAGNESIUM 20 MG PO TBEC: 20.0000 mg | DELAYED_RELEASE_TABLET | Freq: Every day | ORAL | 0 refills | Status: DC

## 2022-06-15 MED ORDER — ACETAMINOPHEN 325 MG PO TABS: 650.0000 mg | ORAL_TABLET | Freq: Four times a day (QID) | ORAL | Status: DC | PRN

## 2022-06-15 MED ORDER — METOPROLOL SUCCINATE ER 25 MG PO TB24: 25.0000 mg | ORAL_TABLET | Freq: Every day | ORAL | 0 refills | Status: DC

## 2022-06-15 MED ORDER — HYDROCHLOROTHIAZIDE 50 MG PO TABS: 50.0000 mg | ORAL_TABLET | Freq: Every day | ORAL | 0 refills | Status: DC

## 2022-06-15 MED ORDER — APIXABAN 5 MG PO TABS: 5.0000 mg | ORAL_TABLET | Freq: Two times a day (BID) | ORAL | 0 refills | Status: DC

## 2022-06-15 MED ORDER — CLONIDINE HCL 0.1 MG PO TABS
0.1000 mg | ORAL_TABLET | Freq: Two times a day (BID) | ORAL | Status: DC
  Administered 2022-06-15: 0.1 mg via ORAL
  Filled 2022-06-15: qty 1

## 2022-06-15 MED ORDER — ONDANSETRON 4 MG PO TBDP: 4.0000 mg | ORAL_TABLET | Freq: Four times a day (QID) | ORAL | 0 refills | Status: DC | PRN

## 2022-06-15 NOTE — Progress Notes (Signed)
Discharge instructions reviewed with pt.  Copy of instructions given to pt. Pt informed her scripts sent to her pharmacy for pick up. Pt's daughter on her way to pick up pt, pt will call unit desk when her ride arrives at main entrance.   Pt to be d/c'd via wheelchair with belongings, with daughter when she arrives.             To be escorted by staff.

## 2022-06-15 NOTE — Progress Notes (Signed)
      [address]       [place] [address]             [phone]      3 Days Post-Op Procedure(s) (LRB): XI ROBOTIC ASSISTED HIATAL HERNIA REPAIR (N/A) ESOPHAGOGASTRODUODENOSCOPY (EGD) (N/A)  Subjective:  Patient is feeling much better.  She is not having any further diarrhea.  She continues to have some discomfort with swallowing.  She ambulated 500 ft yesterday.  Objective: Vital signs in last 24 hours: Temp:  [97.8 F (36.6 C)-98.7 F (37.1 C)] 98 F (36.7 C) (02/04 0353) Pulse Rate:  [68-81] 69 (02/04 0353) Cardiac Rhythm: Normal sinus rhythm (02/03 1930) Resp:  [20] 20 (02/04 0353) BP: (122-150)/(70-80) 140/80 (02/04 0353) SpO2:  [92 %-97 %] 92 % (02/04 0353)  Intake/Output from previous day: 02/03 0701 - 02/04 0700 In: 240 [P.O.:240] Out: -   General appearance: alert, cooperative, and no distress Heart: regular rate and rhythm Lungs: clear to auscultation bilaterally Abdomen: soft, non-tender; bowel sounds normal; no masses,  no organomegaly Extremities: extremities normal, atraumatic, no cyanosis or edema Wound: clean and dry  Lab Results: Recent Labs    06/13/22 0212  WBC 10.8*  HGB 12.9  HCT 38.9  PLT 322   BMET:  Recent Labs    06/13/22 0212 06/14/22 0112  NA 134* 134*  K 3.8 3.4*  CL 97* 97*  CO2 24 27  GLUCOSE 141* 119*  BUN 23* 32*  CREATININE 1.26* 1.31*  CALCIUM 9.4 9.4    PT/INR: No results for input(s): "LABPROT", "INR" in the last 72 hours. ABG    Component Value Date/Time   PHART 7.445 12/14/2020 1018   HCO3 24.9 12/14/2020 1018   TCO2 25 01/27/2018 0814   O2SAT 96.8 12/14/2020 1018   CBG (last 3)  No results for input(s): "GLUCAP" in the last 72 hours.  Assessment/Plan: S/P Procedure(s) (LRB): XI ROBOTIC ASSISTED HIATAL HERNIA REPAIR (N/A) ESOPHAGOGASTRODUODENOSCOPY (EGD) (N/A)  CV- NSR, + HTN- will resume home medications Pulm- not on oxygen, continue IS GI- diarrhea resolved, tolerating diet  w/o N/V Dispo- patient stable, will d/c home today   LOS: 3 days  Ellwood Handler, PA-C 06/15/2022

## 2022-06-15 NOTE — Progress Notes (Signed)
Mobility Specialist Progress Note:   06/15/22 0923  Mobility  Activity Ambulated independently in hallway  Level of Assistance Independent  Assistive Device None  Distance Ambulated (ft) 500 ft  Activity Response Tolerated well  Mobility Referral Yes  $Mobility charge 1 Mobility   Pt received in bed and agreeable. No complaints. Pt returned to bed with all needs met, call bell in reach, and NT in room.   Andrey Campanile Mobility Specialist Please contact via SecureChat or  Rehab office at [phone]

## 2022-06-16 ENCOUNTER — Telehealth: Payer: Self-pay

## 2022-06-16 ENCOUNTER — Other Ambulatory Visit: Payer: Self-pay | Admitting: *Deleted

## 2022-06-16 DIAGNOSIS — I10 Essential (primary) hypertension: Secondary | ICD-10-CM

## 2022-06-16 MED ORDER — METOPROLOL SUCCINATE ER 25 MG PO TB24: 25.0000 mg | ORAL_TABLET | Freq: Every day | ORAL | 0 refills | Status: DC

## 2022-06-16 MED ORDER — FLUOXETINE HCL 20 MG PO TABS: 40.0000 mg | ORAL_TABLET | Freq: Every day | ORAL | 0 refills | Status: DC

## 2022-06-16 NOTE — Telephone Encounter (Signed)
Transition Care Management Follow-up Telephone Call Date of discharge and from where: [facility] 06/15/2022 How have you been since you were released from the hospital? sore Any questions or concerns? No  Items Reviewed: Did the pt receive and understand the discharge instructions provided? Yes  Medications obtained and verified? Yes  Other? No  Any new allergies since your discharge? No  Dietary orders reviewed? Yes Do you have support at home? Yes   Home Care and Equipment/Supplies: Were home health services ordered? no If so, what is the name of the agency? N/a  Has the agency set up a time to come to the patient's home? no Were any new equipment or medical supplies ordered?  No What is the name of the medical supply agency? N/a Were you able to get the supplies/equipment? no Do you have any questions related to the use of the equipment or supplies? No  Functional Questionnaire: (I = Independent and D = Dependent) ADLs: I  Bathing/Dressing- I  Meal Prep- I  Eating- I  Maintaining continence- I  Transferring/Ambulation- I  Managing Meds- I  Follow up appointments reviewed:  PCP Hospital f/u appt confirmed? No   Specialist Hospital f/u appt confirmed? Yes  Scheduled to see Dr Kipp Brood on 06/18/2022 @ televisit. Are transportation arrangements needed? No  If their condition worsens, is the pt aware to call PCP or go to the Emergency Dept.? Yes Was the patient provided with contact information for the PCP's office or ED? Yes Was to pt encouraged to call back with questions or concerns? Yes .lh

## 2022-06-16 NOTE — Progress Notes (Signed)
[facility] called with medication clarification. Per Pharmacist, Fluoxetine capsules can not be crushed. It is recommended that '20mg'$  tablets be ordered to replace capsules. Also, Metoprolol can not be crushed, recommendations are made to split pill instead. Per Josie Saunders, PA, Fluoxetine tablets ordered to replace capsules. Metoprolol RX changed to reflect pharmacist recommendations.

## 2022-06-16 NOTE — Anesthesia Postprocedure Evaluation (Signed)
Anesthesia Post Note  Patient: EVERLENE CUNNING  Procedure(s) Performed: XI ROBOTIC ASSISTED HIATAL HERNIA REPAIR (C[place]) ESOPHAGOGASTRODUODENOSCOPY (EGD)     Patient location during evaluation: PACU Anesthesia Type: General Level of consciousness: awake and alert Pain management: pain level controlled Vital Signs Assessment: post-procedure vital signs reviewed and stable Respiratory status: spontaneous breathing, nonlabored ventilation and respiratory function stable Cardiovascular status: blood pressure returned to baseline and stable Postop Assessment: no apparent nausea or vomiting Anesthetic complications: no   No notable events documented.  Last Vitals:  Vitals:   06/15/22 0353 06/15/22 0925  BP: (!) 140/80 (!) 128/90  Pulse: 69 88  Resp: 20 20  Temp: 36.7 C 36.7 C  SpO2: 92% 98%    Last Pain:  Vitals:   06/15/22 0952  TempSrc:   PainSc: 8                  Reilly Blades

## 2022-06-17 ENCOUNTER — Telehealth: Payer: Self-pay | Admitting: Physician Assistant

## 2022-06-17 NOTE — Telephone Encounter (Incomplete)
[address]            [place] [address]          [phone]     BLANCH STANG is an 62 y.o. female. [birth year] [id number]  History of Presenting Illness:  This is 62 year old female with a past medical history of breast cancer, fibromyalgia, depression, hyperlipidemia, anxiety, anemia, GERD, para esophageal hernia who is s/p esophagoscopy, robotic assisted laparoscopy, para esophageal hernia repair and gastropexy by Dr. Kipp Brood on 06/12/2022. She was discharged in stable condition on 06/15/2022 on a Dysphagia I diet. I called her on her ** phone and used her name (I was in the office) and birthday to identify her. Patient reports **  Past Medical History: Past Medical History:  Diagnosis Date   Acid reflux    takes Zantac and Omeprazole daily   Anemia    Anxiety    takes Citaopram daily   Arthritis    right knee   Arthrosis    left thumb CMC   Breast cancer (HCC)    Chronic back pain    DDD   Clotting disorder (HCC)    hx of blood clot following knee scope   Depression    Fibromyalgia    History of bronchitis 3+yrs ago   Hyperlipidemia    takes Atorvastatin daily   Hypertension    takes Lisinopril and HCTZ daily   Insomnia    takes Elavil nightly as needed   Paraesophageal hernia    Personal history of radiation therapy     Past Surgical History: Past Surgical History:  Procedure Laterality Date   BREAST BIOPSY Left    BREAST LUMPECTOMY     BREAST LUMPECTOMY WITH RADIOACTIVE SEED AND SENTINEL LYMPH NODE BIOPSY Right 06/03/2021   Procedure: RIGHT BREAST LUMPECTOMY WITH RADIOACTIVE SEED AND SENTINEL LYMPH NODE BIOPSY;  Surgeon: Jovita Kussmaul, MD;  Location: [facility];  Service: General;  Laterality: Right;   BUNIONECTOMY Right 02/18/2018   Procedure: Yehuda Budd;  Surgeon: Edrick Kins, DPM;  Location: [facility];  Service: Podiatry;  Laterality: Right;   CAPSULOTOMY Bilateral 02/18/2018    Procedure: CAPSULOTOMY MPJ RELEASE JOINT 2N BILATERAL;  Surgeon: Edrick Kins, DPM;  Location: [facility];  Service: Podiatry;  Laterality: Bilateral;   CARPOMETACARPEL SUSPENSION PLASTY Left 01/27/2018   Procedure: LEFT THUMB ligament reconstruction and tendon interposition;  Surgeon: Leandrew Koyanagi, MD;  Location: [facility];  Service: Orthopedics;  Laterality: Left;   CARPOMETACARPEL SUSPENSION PLASTY Left 03/07/2020   Procedure: REVISION LEFT THUMB CARPOMETACARPAL ([place]) ARTHROPLASTY;  Surgeon: Leandrew Koyanagi, MD;  Location: [facility];  Service: Orthopedics;  Laterality: Left;   CHONDROPLASTY Right 06/28/2014   Procedure: CHONDROPLASTY;  Surgeon: Marianna Payment, MD;  Location: [facility];  Service: Orthopedics;  Laterality: Right;   COLONOSCOPY N/A 05/29/2014   Procedure: COLONOSCOPY;  Surgeon: Daneil Dolin, MD;  [facility];  Service: Endoscopy;  Laterality: N/A;  215pm- Pt is working until 12:00 so she can't come any earlier   ESOPHAGOGASTRODUODENOSCOPY N/A 05/29/2014   Procedure: ESOPHAGOGASTRODUODENOSCOPY (EGD);  Surgeon: Daneil Dolin, MD;  [facility];  Service: Endoscopy;  Laterality: N/A;   ESOPHAGOGASTRODUODENOSCOPY N/A 12/17/2020   Procedure: ESOPHAGOGASTRODUODENOSCOPY (EGD);  Surgeon: Lajuana Matte, MD;  Location: [facility];  Service: Thoracic;  Laterality: N/A;  ESOPHAGOGASTRODUODENOSCOPY N/A 06/12/2022   Procedure: ESOPHAGOGASTRODUODENOSCOPY (EGD);  Surgeon: Lajuana Matte, MD;  Location: [facility];  Service: Thoracic;  Laterality: N/A;   GANGLION CYST EXCISION Left 01/13/2002   HAMMER TOE SURGERY Bilateral 02/18/2018   Procedure: HAMMER TOE CORRECTION2ND BILATERAL;  Surgeon: Edrick Kins, DPM;  Location: [facility];  Service: Podiatry;  Laterality: Bilateral;   HERNIA REPAIR     IRRIGATION AND DEBRIDEMENT ABSCESS Right 09/08/2021   Procedure: IRRIGATION AND DEBRIDEMENT RIGHT BREAST ABSCESS;  Surgeon: Donnie Mesa, MD;  [facility]: [facility];  Service: General;  Laterality: Right;   KNEE ARTHROSCOPY WITH MEDIAL MENISECTOMY Right 06/28/2014   Procedure: RIGHT KNEE ARTHROSCOPY WITH PARTIAL MEDIAL MENISCECTOMY AND CHONDROPLASTY;  Surgeon: Marianna Payment, MD;  Location: [facility];  Service: Orthopedics;  Laterality: Right;   MALONEY DILATION N/A 05/29/2014   Procedure: Venia Minks DILATION;  Surgeon: Daneil Dolin, MD;  [facility];  Service: Endoscopy;  Laterality: N/A;   PARTIAL KNEE ARTHROPLASTY Right 09/15/2014   Procedure: RIGHT UNICOMPARTMENTAL KNEE ARTHROPLASTY;  Surgeon: Leandrew Koyanagi, MD;  Location: [facility];  Service: Orthopedics;  Laterality: Right;   SHOULDER ARTHROSCOPY Right    TOTAL KNEE ARTHROPLASTY Left 04/08/2004   XI ROBOTIC ASSISTED HIATAL HERNIA REPAIR N/A 06/12/2022   Procedure: XI ROBOTIC ASSISTED HIATAL HERNIA REPAIR;  Surgeon: Lajuana Matte, MD;  Location: [facility];  Service: Thoracic;  Laterality: N/A;   XI ROBOTIC ASSISTED PARAESOPHAGEAL HERNIA REPAIR N/A 12/17/2020   Procedure: XI ROBOTIC ASSISTED Laparoscopy PARAESOPHAGEAL HERNIA REPAIR WITH FUNDOPLICATION;  Surgeon: Lajuana Matte, MD;  Location: [facility];  Service: Thoracic;  Laterality: N/A;     Assessment/Plan ***  Nani Skillern, PA-C 06/17/2022, 8:52 AM

## 2022-06-18 ENCOUNTER — Telehealth: Payer: Self-pay

## 2022-06-19 ENCOUNTER — Ambulatory Visit (INDEPENDENT_AMBULATORY_CARE_PROVIDER_SITE_OTHER): Payer: Self-pay | Admitting: Thoracic Surgery (Cardiothoracic Vascular Surgery)

## 2022-06-19 DIAGNOSIS — Z9889 Other specified postprocedural states: Secondary | ICD-10-CM

## 2022-06-19 DIAGNOSIS — Z8719 Personal history of other diseases of the digestive system: Secondary | ICD-10-CM

## 2022-06-19 NOTE — Progress Notes (Signed)
     [address]       [place] [address]             [phone]       Patient: Home Provider: Office Consent for Telemedicine visit obtained.  Today's visit was completed via a real-time telehealth (see specific modality noted below). The patient/authorized person provided oral consent at the time of the visit to engage in a telemedicine encounter with the present provider at [facility]. The patient/authorized person was informed of the potential benefits, limitations, and risks of telemedicine. The patient/authorized person expressed understanding that the laws that protect confidentiality also apply to telemedicine. The patient/authorized person acknowledged understanding that telemedicine does not provide emergency services and that he or she would need to call 911 or proceed to the nearest hospital for help if such a need arose.   Total time spent in the clinical discussion 10 minutes.  Telehealth Modality: Phone visit (audio only)  I had a telephone visit with [name].  Overall doing better.  She does still have some surgical pain.  She denies any reflux, or dysphagia.  She will follow-up in 1 month with a cxr.  [name]

## 2022-06-23 ENCOUNTER — Ambulatory Visit (INDEPENDENT_AMBULATORY_CARE_PROVIDER_SITE_OTHER): Payer: Medicare Other | Admitting: Nurse Practitioner

## 2022-06-23 ENCOUNTER — Encounter: Payer: Self-pay | Admitting: Nurse Practitioner

## 2022-06-23 VITALS — BP 130/81 | HR 96 | Ht 63.0 in | Wt 216.1 lb

## 2022-06-23 DIAGNOSIS — I10 Essential (primary) hypertension: Secondary | ICD-10-CM

## 2022-06-23 DIAGNOSIS — F32A Depression, unspecified: Secondary | ICD-10-CM | POA: Diagnosis not present

## 2022-06-23 DIAGNOSIS — K8020 Calculus of gallbladder without cholecystitis without obstruction: Secondary | ICD-10-CM

## 2022-06-23 DIAGNOSIS — R911 Solitary pulmonary nodule: Secondary | ICD-10-CM | POA: Insufficient documentation

## 2022-06-23 DIAGNOSIS — Z9889 Other specified postprocedural states: Secondary | ICD-10-CM

## 2022-06-23 DIAGNOSIS — Z8719 Personal history of other diseases of the digestive system: Secondary | ICD-10-CM

## 2022-06-23 MED ORDER — FLUOXETINE HCL 20 MG PO TABS: 40.0000 mg | ORAL_TABLET | Freq: Every day | ORAL | 2 refills | Status: DC

## 2022-06-23 NOTE — Progress Notes (Signed)
Established patient visit   Patient: [name]   DOB: 08-07-1960   62 y.o. Female  MRN: [id number] Visit Date: 06/23/2022   Chief Complaint  Patient presents with   Follow-up   Hypertension   Subjective    HPI  Hypertension  -generally well controlled  - did not take blood pressure medication today -second repair of hiatal hernia done 06/12/2022.  -she is recovering from this surgery.  -is due to have routine, fasting labs  -due for medicare wellness visit.   -new spot found on her lungs during hospitalization  -gallstones, measuring up to 2.2 cm in diameter.  -will see Dr. Donne Hazel. Unsure if she needs a referral or not.      Medications: No facility-administered medications prior to visit.   Outpatient Medications Prior to Visit  Medication Sig   albuterol (VENTOLIN HFA) 108 (90 Base) MCG/ACT inhaler Inhale 2 puffs into the lungs every 6 (six) hours as needed for wheezing or shortness of breath.   amLODipine (NORVASC) 5 MG tablet Take 1 tablet (5 mg total) by mouth daily.   apixaban (ELIQUIS) 5 MG TABS tablet Take 1 tablet (5 mg total) by mouth 2 (two) times daily. Crush tablet before taking   dextromethorphan-guaiFENesin (MUCINEX DM) 30-600 MG 12hr tablet Take 1 tablet by mouth 2 (two) times daily. Crush tablet before taking (Patient not taking: Reported on 07/23/2022)   hydrochlorothiazide (HYDRODIURIL) 50 MG tablet Take 1 tablet (50 mg total) by mouth daily. Crush tablet before taking   metoprolol succinate (TOPROL-XL) 25 MG 24 hr tablet Take 1 tablet (25 mg total) by mouth daily. Split tablet in 1/2 prior to taking. (Patient taking differently: Take 25 mg by mouth daily.)   [DISCONTINUED] acetaminophen (TYLENOL) 325 MG tablet Take 2 tablets (650 mg total) by mouth every 6 (six) hours as needed for mild pain (or Fever >/= 101). Crush tablet before taking   [DISCONTINUED] cloNIDine (CATAPRES) 0.1 MG tablet Take 1 tablet (0.1 mg total) by mouth 2 (two) times daily.    [DISCONTINUED] FLUoxetine (PROZAC) 20 MG tablet Take 2 tablets (40 mg total) by mouth daily. Crush tablets before taking.   [DISCONTINUED] FLUoxetine (PROZAC) 40 MG capsule Take 1 capsule (40 mg total) by mouth daily. Crush tablet before taking   [DISCONTINUED] HYDROcodone-acetaminophen (NORCO) 7.5-325 MG tablet Take 1 tablet by mouth every 6 (six) hours as needed for moderate pain. (Patient not taking: Reported on 07/23/2022)   [DISCONTINUED] loperamide (IMODIUM) 2 MG capsule Take 1-2 capsules (2-4 mg total) by mouth as needed for diarrhea or loose stools.   [DISCONTINUED] methocarbamol (ROBAXIN-750) 750 MG tablet Take 1 tablet (750 mg total) by mouth 2 (two) times daily as needed for muscle spasms. Crush tablet before taking   [DISCONTINUED] ondansetron (ZOFRAN-ODT) 4 MG disintegrating tablet Take 1 tablet (4 mg total) by mouth every 6 (six) hours as needed for nausea.    Review of Systems  Constitutional:  Positive for fatigue. Negative for activity change, appetite change, chills and fever.  HENT:  Negative for congestion, postnasal drip, rhinorrhea, sinus pressure, sinus pain, sneezing and sore throat.   Eyes: Negative.   Respiratory:  Negative for cough, chest tightness, shortness of breath and wheezing.   Cardiovascular:  Negative for chest pain and palpitations.  Gastrointestinal:  Negative for abdominal pain, constipation, diarrhea, nausea and vomiting.       Healing from repair of hiatal hernia.   Endocrine: Negative for cold intolerance, heat intolerance, polydipsia and polyuria.  Genitourinary:  Negative  for dyspareunia, dysuria, flank pain, frequency and urgency.  Musculoskeletal:  Negative for arthralgias, back pain and myalgias.  Skin:  Negative for rash.  Allergic/Immunologic: Negative for environmental allergies.  Neurological:  Negative for dizziness, weakness and headaches.  Hematological:  Negative for adenopathy.  Psychiatric/Behavioral:  Positive for dysphoric mood. The  patient is nervous/anxious.        Well managed on current medication. Patient does have PTSD and will begin seeing counselor for this in near future.     Last CBC Lab Results  Component Value Date   WBC 9.3 07/24/2022   HGB 13.2 07/24/2022   HCT 39.1 07/24/2022   MCV 87.9 07/24/2022   MCH 29.7 07/24/2022   RDW 14.7 07/24/2022   PLT 373 59/56/3875   Last metabolic panel Lab Results  Component Value Date   GLUCOSE 117 (H) 07/24/2022   NA 135 07/24/2022   K 3.1 (L) 07/24/2022   CL 100 07/24/2022   CO2 23 07/24/2022   BUN 25 (H) 07/24/2022   CREATININE 1.23 (H) 07/24/2022   GFRNONAA 50 (L) 07/24/2022   CALCIUM 9.5 07/24/2022   PHOS 4.4 (H) 12/23/2021   PROT 7.1 06/10/2022   ALBUMIN 3.7 06/10/2022   LABGLOB 2.8 11/26/2021   AGRATIO 1.5 11/26/2021   BILITOT 0.4 06/10/2022   ALKPHOS 93 06/10/2022   AST 28 06/10/2022   ALT 25 06/10/2022   ANIONGAP 12 07/24/2022   Last lipids Lab Results  Component Value Date   CHOL 236 (H) 06/29/2018   HDL 57 06/29/2018   LDLCALC 155 (H) 06/29/2018   TRIG 122 06/29/2018   CHOLHDL 4.1 06/29/2018   Last hemoglobin A1c Lab Results  Component Value Date   HGBA1C 5.8 (H) 08/22/2019   Last thyroid functions Lab Results  Component Value Date   TSH 1.720 08/22/2019   Last vitamin D Lab Results  Component Value Date   VD25OH 18.4 (L) 08/22/2019       Objective     Today's Vitals   06/23/22 1313  BP: 130/81  Pulse: 96  SpO2: 98%  Weight: 216 lb 1.9 oz (98 kg)  Height: 5\' 3"  (1.6 m)   Body mass index is 38.28 kg/m.  BP Readings from Last 3 Encounters:  07/31/22 123/66  07/24/22 115/81  07/18/22 130/83    Wt Readings from Last 3 Encounters:  07/29/22 220 lb 4.8 oz (99.9 kg)  07/24/22 220 lb 4.8 oz (99.9 kg)  07/18/22 219 lb (99.3 kg)    Physical Exam Vitals and nursing note reviewed.  Constitutional:      Appearance: Normal appearance. She is well-developed. She is ill-appearing.  HENT:     Head: Normocephalic  and atraumatic.     Nose: Nose normal.     Mouth/Throat:     Mouth: Mucous membranes are moist.     Pharynx: Oropharynx is clear.  Eyes:     Extraocular Movements: Extraocular movements intact.     Conjunctiva/sclera: Conjunctivae normal.     Pupils: Pupils are equal, round, and reactive to light.  Cardiovascular:     Rate and Rhythm: Normal rate and regular rhythm.     Pulses: Normal pulses.     Heart sounds: Normal heart sounds.  Pulmonary:     Effort: Pulmonary effort is normal.     Breath sounds: Normal breath sounds.  Abdominal:     General: There is no distension.     Palpations: Abdomen is soft. There is no mass.     Tenderness: There  is abdominal tenderness. There is no right CVA tenderness, left CVA tenderness, guarding or rebound.     Hernia: No hernia is present.  Musculoskeletal:        General: Normal range of motion.     Cervical back: Normal range of motion and neck supple.  Lymphadenopathy:     Cervical: No cervical adenopathy.  Skin:    General: Skin is warm and dry.     Capillary Refill: Capillary refill takes less than 2 seconds.  Neurological:     General: No focal deficit present.     Mental Status: She is alert and oriented to person, place, and time.  Psychiatric:        Mood and Affect: Mood normal.        Behavior: Behavior normal.        Thought Content: Thought content normal.        Judgment: Judgment normal.     Assessment & Plan    1. Essential hypertension Stable. Continue bp medication as prescribed   2. Depression, unspecified depression type Stable. Continue prozac 40 mg daily.  - FLUoxetine (PROZAC) 20 MG tablet; Take 2 tablets (40 mg total) by mouth daily. Crush tablets before taking.  Dispense: 60 tablet; Refill: 2  3. Gallstones Gallstones noted on CT scan done in hospital. Will see Dr. Donne Hazel - surgery - for further evaluation.   4. Pulmonary nodule, right Stable 7 mm pulmonary nodule noted on ct chest done in hospital.  Repeat in 1 year for follow up   5. S/P hernia repair Patient doing well after repair of hiatal hernia.     Problem List Items Addressed This Visit       Cardiovascular and Mediastinum   Essential hypertension - Primary     Respiratory   Pulmonary nodule, right     Digestive   Gallstones     Other   Depression   Relevant Medications   FLUoxetine (PROZAC) 20 MG tablet   Other Visit Diagnoses     S/P hernia repair            Return in about 6 months (around 12/22/2022) for medicare wellness, FBW a week prior to visit.         Ronnell Freshwater, NP  [facility] [phone] (phone) [phone] (fax)  [place]

## 2022-06-24 NOTE — Progress Notes (Unsigned)
Synopsis: Referred for RML lung nodule by Lajuana Matte, MD  Subjective:   PATIENT ID: [name] GENDER: female DOB: Jul 04, 1960, MRN: [id number]  No chief complaint on file.  62yF with history of R breast cancer sp lumpectomy/XRT, paraesophageal hernia sp repair/gastropexy 06/12/22 with Dr. Kipp Brood referred for RML pulmonary nodule  Otherwise pertinent review of systems is negative.  Past Medical History:  Diagnosis Date   Acid reflux    takes Zantac and Omeprazole daily   Anemia    Anxiety    takes Citaopram daily   Arthritis    right knee   Arthrosis    left thumb [facility]   Breast cancer (HCC)    Chronic back pain    DDD   Clotting disorder (HCC)    hx of blood clot following knee scope   Depression    Fibromyalgia    History of bronchitis 3+yrs ago   Hyperlipidemia    takes Atorvastatin daily   Hypertension    takes Lisinopril and HCTZ daily   Insomnia    takes Elavil nightly as needed   Paraesophageal hernia    Personal history of radiation therapy      Family History  Problem Relation Age of Onset   Stroke Mother    Lung cancer Father 65   GI problems Father    Prostate cancer Father    Cancer Sister        breast   Breast cancer Sister 19   Lung cancer Maternal Grandfather    Lung cancer Paternal Grandfather    Diabetes Son    Colon cancer Neg Hx    Esophageal cancer Neg Hx    Pancreatic cancer Neg Hx      Past Surgical History:  Procedure Laterality Date   BREAST BIOPSY Left    BREAST LUMPECTOMY     BREAST LUMPECTOMY WITH RADIOACTIVE SEED AND SENTINEL LYMPH NODE BIOPSY Right 06/03/2021   Procedure: RIGHT BREAST LUMPECTOMY WITH RADIOACTIVE SEED AND SENTINEL LYMPH NODE BIOPSY;  Surgeon: Jovita Kussmaul, MD;  Location: [facility];  Service: General;  Laterality: Right;   BUNIONECTOMY Right 02/18/2018   Procedure: Yehuda Budd;  Surgeon: Edrick Kins, DPM;  Location: [facility];  Service: Podiatry;  Laterality: Right;    CAPSULOTOMY Bilateral 02/18/2018   Procedure: CAPSULOTOMY MPJ RELEASE JOINT 2N BILATERAL;  Surgeon: Edrick Kins, DPM;  Location: [facility];  Service: Podiatry;  Laterality: Bilateral;   CARPOMETACARPEL SUSPENSION PLASTY Left 01/27/2018   Procedure: LEFT THUMB ligament reconstruction and tendon interposition;  Surgeon: Leandrew Koyanagi, MD;  Location: [place];  Service: Orthopedics;  Laterality: Left;   CARPOMETACARPEL SUSPENSION PLASTY Left 03/07/2020   Procedure: REVISION LEFT THUMB CARPOMETACARPAL ([place]) ARTHROPLASTY;  Surgeon: Leandrew Koyanagi, MD;  Location: [facility];  Service: Orthopedics;  Laterality: Left;   CHONDROPLASTY Right 06/28/2014   Procedure: CHONDROPLASTY;  Surgeon: Marianna Payment, MD;  Location: [facility];  Service: Orthopedics;  Laterality: Right;   COLONOSCOPY N/A 05/29/2014   Procedure: COLONOSCOPY;  Surgeon: Daneil Dolin, MD;  [facility];  Service: Endoscopy;  Laterality: N/A;  215pm- Pt is working until 12:00 so she can't come any earlier   ESOPHAGOGASTRODUODENOSCOPY N/A 05/29/2014   Procedure: ESOPHAGOGASTRODUODENOSCOPY (EGD);  Surgeon: Daneil Dolin, MD;  [facility];  Service: Endoscopy;  Laterality: N/A;   ESOPHAGOGASTRODUODENOSCOPY N/A 12/17/2020   Procedure: ESOPHAGOGASTRODUODENOSCOPY (EGD);  Surgeon: Lajuana Matte, MD;  [facility];  Service: Thoracic;  Laterality: N/A;   ESOPHAGOGASTRODUODENOSCOPY N/A 06/12/2022   Procedure: ESOPHAGOGASTRODUODENOSCOPY (EGD);  Surgeon: Lajuana Matte, MD;  Location: [facility];  Service: Thoracic;  Laterality: N/A;   GANGLION CYST EXCISION Left 01/13/2002   HAMMER TOE SURGERY Bilateral 02/18/2018   Procedure: HAMMER TOE CORRECTION2ND BILATERAL;  Surgeon: Edrick Kins, DPM;  Location: [place];  Service: Podiatry;  Laterality: Bilateral;   HERNIA REPAIR     IRRIGATION AND DEBRIDEMENT ABSCESS Right 09/08/2021   Procedure: IRRIGATION AND DEBRIDEMENT  RIGHT BREAST ABSCESS;  Surgeon: Donnie Mesa, MD;  Location: [facility];  Service: General;  Laterality: Right;   KNEE ARTHROSCOPY WITH MEDIAL MENISECTOMY Right 06/28/2014   Procedure: RIGHT KNEE ARTHROSCOPY WITH PARTIAL MEDIAL MENISCECTOMY AND CHONDROPLASTY;  Surgeon: Marianna Payment, MD;  Location: [facility];  Service: Orthopedics;  Laterality: Right;   MALONEY DILATION N/A 05/29/2014   Procedure: Venia Minks DILATION;  Surgeon: Daneil Dolin, MD;  [facility];  Service: Endoscopy;  Laterality: N/A;   PARTIAL KNEE ARTHROPLASTY Right 09/15/2014   Procedure: RIGHT UNICOMPARTMENTAL KNEE ARTHROPLASTY;  Surgeon: Leandrew Koyanagi, MD;  Location: [facility];  Service: Orthopedics;  Laterality: Right;   SHOULDER ARTHROSCOPY Right    TOTAL KNEE ARTHROPLASTY Left 04/08/2004   XI ROBOTIC ASSISTED HIATAL HERNIA REPAIR N/A 06/12/2022   Procedure: XI ROBOTIC ASSISTED HIATAL HERNIA REPAIR;  Surgeon: Lajuana Matte, MD;  Location: [facility];  Service: Thoracic;  Laterality: N/A;   XI ROBOTIC ASSISTED PARAESOPHAGEAL HERNIA REPAIR N/A 12/17/2020   Procedure: XI ROBOTIC ASSISTED Laparoscopy PARAESOPHAGEAL HERNIA REPAIR WITH FUNDOPLICATION;  Surgeon: Lajuana Matte, MD;  Location: [facility];  Service: Thoracic;  Laterality: N/A;    Social History   Socioeconomic History   Marital status: Divorced    Spouse name: Not on file   Number of children: 5   Years of education: Not on file   Highest education level: Not on file  Occupational History   Occupation: Scientist, water quality  Tobacco Use   Smoking status: Never   Smokeless tobacco: Never  Vaping Use   Vaping Use: Never used  Substance and Sexual Activity   Alcohol use: No    Alcohol/week: 0.0 standard drinks of alcohol   Drug use: No   Sexual activity: Not Currently    Birth control/protection: None, Post-menopausal  Other Topics Concern   Not on file  Social History Narrative   Right Handed    Lives in a one story home    Social  Determinants of Health   Financial Resource Strain: Not on file  Food Insecurity: No Food Insecurity (06/12/2022)   Hunger Vital Sign    Worried About Running Out of Food in the Last Year: Never true    Ran Out of Food in the Last Year: Never true  Transportation Needs: No Transportation Needs (06/12/2022)   PRAPARE - Hydrologist (Medical): No    Lack of Transportation (Non-Medical): No  Physical Activity: Sufficiently Active (07/16/2017)   Exercise Vital Sign    Days of Exercise per Week: 7 days    Minutes of Exercise per Session: 60 min  Stress: Stress Concern Present (07/16/2017)   [place]    Feeling of Stress : Rather much  Social Connections: Moderately Isolated (07/16/2017)   Social Connection and Isolation Panel [NHANES]    Frequency of Communication with Friends and Family: More than three times a  week    Frequency of Social Gatherings with Friends and Family: Once a week    Attends Religious Services: Never    Marine scientist or Organizations: No    Attends Archivist Meetings: Never    Marital Status: Divorced  Human resources officer Violence: Not At Risk (06/12/2022)   Humiliation, Afraid, Rape, and Kick questionnaire    Fear of Current or Ex-Partner: No    Emotionally Abused: No    Physically Abused: No    Sexually Abused: No     Allergies  Allergen Reactions   Tramadol Itching    "bugs crawling all over"     Outpatient Medications Prior to Visit  Medication Sig Dispense Refill   albuterol (VENTOLIN HFA) 108 (90 Base) MCG/ACT inhaler Inhale 2 puffs into the lungs every 6 (six) hours as needed for wheezing or shortness of breath. 8 g 2   amLODipine (NORVASC) 5 MG tablet Take 1 tablet (5 mg total) by mouth daily. 90 tablet 1   apixaban (ELIQUIS) 5 MG TABS tablet Take 1 tablet (5 mg total) by mouth 2 (two) times daily. Crush tablet before taking 60 tablet 0    cloNIDine (CATAPRES) 0.1 MG tablet Take 1 tablet (0.1 mg total) by mouth 2 (two) times daily. 60 tablet 0   dextromethorphan-guaiFENesin (MUCINEX DM) 30-600 MG 12hr tablet Take 1 tablet by mouth 2 (two) times daily. Crush tablet before taking 30 tablet 0   FLUoxetine (PROZAC) 20 MG tablet Take 2 tablets (40 mg total) by mouth daily. Crush tablets before taking. 60 tablet 2   hydrochlorothiazide (HYDRODIURIL) 50 MG tablet Take 1 tablet (50 mg total) by mouth daily. Crush tablet before taking 90 tablet 0   HYDROcodone-acetaminophen (NORCO) 7.5-325 MG tablet Take 1 tablet by mouth every 6 (six) hours as needed for moderate pain. 30 tablet 0   metoprolol succinate (TOPROL-XL) 25 MG 24 hr tablet Take 1 tablet (25 mg total) by mouth daily. Split tablet in 1/2 prior to taking. 90 tablet 0   No facility-administered medications prior to visit.       Objective:   Physical Exam:  General appearance: 62 y.o., female, NAD, conversant  Eyes: anicteric sclerae; PERRL, tracking appropriately HENT: NCAT; MMM Neck: Trachea midline; no lymphadenopathy, no JVD Lungs: CTAB, no crackles, no wheeze, with normal respiratory effort CV: RRR, no murmur  Abdomen: Soft, non-tender; non-distended, BS present  Extremities: No peripheral edema, warm Skin: Normal turgor and texture; no rash Psych: Appropriate affect Neuro: Alert and oriented to person and place, no focal deficit     There were no vitals filed for this visit.   on *** LPM *** RA BMI Readings from Last 3 Encounters:  06/23/22 38.28 kg/m  06/12/22 39.99 kg/m  06/10/22 40.16 kg/m   Wt Readings from Last 3 Encounters:  06/23/22 216 lb 1.9 oz (98 kg)  06/12/22 225 lb 12 oz (102.4 kg)  06/10/22 226 lb 11.2 oz (102.8 kg)     CBC    Component Value Date/Time   WBC 10.8 (H) 06/13/2022 0212   RBC 4.38 06/13/2022 0212   HGB 12.9 06/13/2022 0212   HGB 13.2 11/26/2021 1346   HCT 38.9 06/13/2022 0212   HCT 39.9 11/26/2021 1346   PLT 322  06/13/2022 0212   PLT 361 11/26/2021 1346   MCV 88.8 06/13/2022 0212   MCV 90 11/26/2021 1346   MCH 29.5 06/13/2022 0212   MCHC 33.2 06/13/2022 0212   RDW 13.8 06/13/2022 0212  RDW 14.2 11/26/2021 1346   LYMPHSABS 2.1 11/20/2021 1607   MONOABS 1.0 10/31/2021 1630   EOSABS 0.1 11/20/2021 1607   BASOSABS 0.1 11/20/2021 1607    ***  Chest Imaging: CT Chest 06/02/22 reviewed by me with 19m RML pulmonary nodule stable relative to prior  Pulmonary Functions Testing Results:     No data to display           Echocardiogram 02/2019:    1. Left ventricular ejection fraction, by visual estimation, is 60 to  65%. The left ventricle has normal function. Normal left ventricular size.  There is no left ventricular hypertrophy.   2. Global right ventricle has normal systolic function.The right  ventricular size is normal. No increase in right ventricular wall  thickness.   3. Left atrial size was mildly dilated.   4. Right atrial size was normal.   5. The mitral valve is normal in structure. Trace mitral valve  regurgitation. No evidence of mitral stenosis.   6. The tricuspid valve is normal in structure. Tricuspid valve  regurgitation is mild.   7. The aortic valve is normal in structure. Aortic valve regurgitation  was not visualized by color flow Doppler. Structurally normal aortic  valve, with no evidence of sclerosis or stenosis.   8. The pulmonic valve was normal in structure. Pulmonic valve  regurgitation is not visualized by color flow Doppler.   9. Normal pulmonary artery systolic pressure.  10. The inferior vena cava is normal in size with greater than 50%  respiratory variability, suggesting right atrial pressure of 3 mmHg.   Heart Catheterization: ***    Assessment & Plan:    Plan:      NMaryjane Hurter MD [facility] 06/24/2022 8:02 PM

## 2022-06-25 ENCOUNTER — Ambulatory Visit (INDEPENDENT_AMBULATORY_CARE_PROVIDER_SITE_OTHER): Payer: Medicare Other | Admitting: Student

## 2022-06-25 ENCOUNTER — Encounter: Payer: Self-pay | Admitting: Student

## 2022-06-25 VITALS — BP 114/78 | HR 86 | Temp 98.4°F | Ht 63.0 in | Wt 219.6 lb

## 2022-06-25 DIAGNOSIS — R911 Solitary pulmonary nodule: Secondary | ICD-10-CM | POA: Diagnosis not present

## 2022-06-25 DIAGNOSIS — R0609 Other forms of dyspnea: Secondary | ICD-10-CM

## 2022-06-25 NOTE — Patient Instructions (Signed)
- albuterol 1-2 puffs as needed for cough, trouble breathing - breathing tests in 8 weeks on same day as next clinic visit - can do bloodwork next visit - I don't think you technically need any more ct scans for this nodule. Based on our guidelines it is very likely benign after watching a nodule of this size with no growth over 2 years, and it's actually already been 4 years that it's been stable. If you place high value on finding out what it is however then we can pursue robotic bronchoscopy to try to diagnose cause of the nodule.   Gastroesophageal Reflux Disease, Adult Gastroesophageal reflux (GER) happens when acid from the stomach flows up into the tube that connects the mouth and the stomach (esophagus). Normally, food travels down the esophagus and stays in the stomach to be digested. However, when a person has GER, food and stomach acid sometimes move back up into the esophagus. If this becomes a more serious problem, the person may be diagnosed with a disease called gastroesophageal reflux disease (GERD). GERD occurs when the reflux: Happens often. Causes frequent or severe symptoms. Causes problems such as damage to the esophagus. When stomach acid comes in contact with the esophagus, the acid may cause inflammation in the esophagus. Over time, GERD may create small holes (ulcers) in the lining of the esophagus. What are the causes? This condition is caused by a problem with the muscle between the esophagus and the stomach (lower esophageal sphincter, or LES). Normally, the LES muscle closes after food passes through the esophagus to the stomach. When the LES is weakened or abnormal, it does not close properly, and that allows food and stomach acid to go back up into the esophagus. The LES can be weakened by certain dietary substances, medicines, and medical conditions, including: Tobacco use. Pregnancy. Having a hiatal hernia. Alcohol use. Certain foods and beverages, such as coffee,  chocolate, onions, and peppermint. What increases the risk? You are more likely to develop this condition if you: Have an increased body weight. Have a connective tissue disorder. Take NSAIDs, such as ibuprofen. What are the signs or symptoms? Symptoms of this condition include: Heartburn. Difficult or painful swallowing and the feeling of having a lump in the throat. A bitter taste in the mouth. Bad breath and having a large amount of saliva. Having an upset or bloated stomach and belching. Chest pain. Different conditions can cause chest pain. Make sure you see your health care provider if you experience chest pain. Shortness of breath or wheezing. Ongoing (chronic) cough or a nighttime cough. Wearing away of tooth enamel. Weight loss. How is this diagnosed? This condition may be diagnosed based on a medical history and a physical exam. To determine if you have mild or severe GERD, your health care provider may also monitor how you respond to treatment. You may also have tests, including: A test to examine your stomach and esophagus with a small camera (endoscopy). A test that measures the acidity level in your esophagus. A test that measures how much pressure is on your esophagus. A barium swallow or modified barium swallow test to show the shape, size, and functioning of your esophagus. How is this treated? Treatment for this condition may vary depending on how severe your symptoms are. Your health care provider may recommend: Changes to your diet. Medicine. Surgery. The goal of treatment is to help relieve your symptoms and to prevent complications. Follow these instructions at home: Eating and drinking  Follow  a diet as recommended by your health care provider. This may involve avoiding foods and drinks such as: Coffee and tea, with or without caffeine. Drinks that contain alcohol. Energy drinks and sports drinks. Carbonated drinks or sodas. Chocolate and  cocoa. Peppermint and mint flavorings. Garlic and onions. Horseradish. Spicy and acidic foods, including peppers, chili powder, curry powder, vinegar, hot sauces, and barbecue sauce. Citrus fruit juices and citrus fruits, such as oranges, lemons, and limes. Tomato-based foods, such as red sauce, chili, salsa, and pizza with red sauce. Fried and fatty foods, such as donuts, french fries, potato chips, and high-fat dressings. High-fat meats, such as hot dogs and fatty cuts of red and white meats, such as rib eye steak, sausage, ham, and bacon. High-fat dairy items, such as whole milk, butter, and cream cheese. Eat small, frequent meals instead of large meals. Avoid drinking large amounts of liquid with your meals. Avoid eating meals during the 2-3 hours before bedtime. Avoid lying down right after you eat. Do not exercise right after you eat. Lifestyle  Do not use any products that contain nicotine or tobacco. These products include cigarettes, chewing tobacco, and vaping devices, such as e-cigarettes. If you need help quitting, ask your health care provider. Try to reduce your stress by using methods such as yoga or meditation. If you need help reducing stress, ask your health care provider. If you are overweight, reduce your weight to an amount that is healthy for you. Ask your health care provider for guidance about a safe weight loss goal. General instructions Pay attention to any changes in your symptoms. Take over-the-counter and prescription medicines only as told by your health care provider. Do not take aspirin, ibuprofen, or other NSAIDs unless your health care provider told you to take these medicines. Wear loose-fitting clothing. Do not wear anything tight around your waist that causes pressure on your abdomen. Raise (elevate) the head of your bed about 6 inches (15 cm). You can use a wedge to do this. Avoid bending over if this makes your symptoms worse. Keep all follow-up  visits. This is important. Contact a health care provider if: You have: New symptoms. Unexplained weight loss. Difficulty swallowing or it hurts to swallow. Wheezing or a persistent cough. A hoarse voice. Your symptoms do not improve with treatment. Get help right away if: You have sudden pain in your arms, neck, jaw, teeth, or back. You suddenly feel sweaty, dizzy, or light-headed. You have chest pain or shortness of breath. You vomit and the vomit is green, yellow, or black, or it looks like blood or coffee grounds. You faint. You have stool that is red, bloody, or black. You cannot swallow, drink, or eat. These symptoms may represent a serious problem that is an emergency. Do not wait to see if the symptoms will go away. Get medical help right away. Call your local emergency services (911 in the [place] Do not drive yourself to the hospital. Summary Gastroesophageal reflux happens when acid from the stomach flows up into the esophagus. GERD is a disease in which the reflux happens often, causes frequent or severe symptoms, or causes problems such as damage to the esophagus. Treatment for this condition may vary depending on how severe your symptoms are. Your health care provider may recommend diet and lifestyle changes, medicine, or surgery. Contact a health care provider if you have new or worsening symptoms. Take over-the-counter and prescription medicines only as told by your health care provider. Do not take aspirin, ibuprofen,  or other NSAIDs unless your health care provider told you to do so. Keep all follow-up visits as told by your health care provider. This is important. This information is not intended to replace advice given to you by your health care provider. Make sure you discuss any questions you have with your health care provider. Document Revised: 11/07/2019 Document Reviewed: 11/07/2019 Elsevier Patient Education  [place].

## 2022-06-28 ENCOUNTER — Other Ambulatory Visit: Payer: Self-pay | Admitting: Nurse Practitioner

## 2022-06-28 DIAGNOSIS — I10 Essential (primary) hypertension: Secondary | ICD-10-CM

## 2022-07-14 ENCOUNTER — Other Ambulatory Visit: Payer: Self-pay | Admitting: General Surgery

## 2022-07-17 ENCOUNTER — Other Ambulatory Visit: Payer: Self-pay | Admitting: Thoracic Surgery (Cardiothoracic Vascular Surgery)

## 2022-07-17 DIAGNOSIS — Z8719 Personal history of other diseases of the digestive system: Secondary | ICD-10-CM

## 2022-07-18 ENCOUNTER — Ambulatory Visit (INDEPENDENT_AMBULATORY_CARE_PROVIDER_SITE_OTHER): Payer: Self-pay | Admitting: Thoracic Surgery (Cardiothoracic Vascular Surgery)

## 2022-07-18 ENCOUNTER — Ambulatory Visit
Admission: RE | Admit: 2022-07-18 | Discharge: 2022-07-18 | Disposition: A | Discharge status: Home / Self Care | Payer: Medicare Other | Source: Ambulatory Visit | Attending: Thoracic Surgery (Cardiothoracic Vascular Surgery) | Admitting: Thoracic Surgery (Cardiothoracic Vascular Surgery)

## 2022-07-18 VITALS — BP 130/83 | HR 79 | Resp 20 | Ht 63.0 in | Wt 219.0 lb

## 2022-07-18 DIAGNOSIS — Z8719 Personal history of other diseases of the digestive system: Secondary | ICD-10-CM

## 2022-07-18 DIAGNOSIS — Z9889 Other specified postprocedural states: Secondary | ICD-10-CM

## 2022-07-18 NOTE — Progress Notes (Signed)
      [address]       [place], [address]             [id number]        Lindley M Frieson [place] Medical Record #[id number] Date of Birth: 03/15/1961  Referring: Mauri Pole, MD Primary Care: Lorrene Reid, PA-C (Inactive) Primary Cardiologist:None  Reason for visit:   follow-up  History of Present Illness:     62yo female presents for 1 month f/u appointment.  Overall doing well.  She has had 3 episodes of dysphagia with hamburger meat.  She had an episode of emesis with this.  She denies any reflux.  Physical Exam: Ht 5\' 3"  (1.6 m)   LMP 02/07/2013   BMI 38.90 kg/m   Alert NAD Incision clean.   Abdomen, ND no peripheral edema   Diagnostic Studies & Laboratory data: CXR: clear     Assessment / Plan:   62yo female s/p redo PEH repair.  Doing well.  She will f/u after another swallow study.  Lajuana Matte 07/18/2022 10:21 AM

## 2022-07-23 NOTE — Progress Notes (Signed)
Surgical Instructions    Your procedure is scheduled on Tuesday, 07/29/22.  Report to [facility] Main Entrance "A" at 11:15 A.M., then check in with the Admitting office.  Call this number if you have problems the morning of surgery:  [phone]   If you have any questions prior to your surgery date call [phone]: Open Monday-Friday 8am-4pm If you experience any cold or flu symptoms such as cough, fever, chills, shortness of breath, etc. between now and your scheduled surgery, please notify us at the above number     Remember:  Do not eat after midnight the night before your surgery  You may drink clear liquids until 10:15am the morning of your surgery.   Clear liquids allowed are: Water, Non-Citrus Juices (without pulp), Carbonated Beverages, Clear Tea, Black Coffee ONLY (NO MILK, CREAM OR POWDERED CREAMER of any kind), and Gatorade  Patient Instructions  The night before surgery:  No food after midnight. ONLY clear liquids after midnight  The day of surgery (if you do NOT have diabetes):  Drink ONE (1) Pre-Surgery Clear Ensure by 10:15am the morning of surgery. Drink in one sitting. Do not sip.  This drink was given to you during your hospital  pre-op appointment visit. Nothing else to drink after completing the  Pre-Surgery Clear Ensure.          If you have questions, please contact your surgeon's office.     Take these medicines the morning of surgery with A SIP OF WATER:  amLODipine (NORVASC)  cloNIDine (CATAPRES)  FLUoxetine (PROZAC)  metoprolol succinate (TOPROL-XL)   IF NEEDED: albuterol (VENTOLIN HFA) inhaler- bring with you the day of surgery   As of today, STOP taking any Aspirin (unless otherwise instructed by your surgeon) Aleve, Naproxen, Ibuprofen, Motrin, Advil, Goody's, BC's, all herbal medications, fish oil, and all vitamins.  Please follow instructions regarding your apixaban (ELIQUIS). If no instructions were given to you, please reach out to  your surgeons office.            Do not wear jewelry or makeup. Do not wear lotions, powders, perfumes or deodorant. Do not shave 48 hours prior to surgery.   Do not bring valuables to the hospital. Do not wear nail polish, gel polish, artificial nails, or any other type of covering on natural nails (fingers and toes) If you have artificial nails or gel coating that need to be removed by a nail salon, please have this removed prior to surgery. Artificial nails or gel coating may interfere with anesthesia's ability to adequately monitor your vital signs.  [facility] is not responsible for any belongings or valuables.    Do NOT Smoke (Tobacco/Vaping)  24 hours prior to your procedure  If you use a CPAP at night, you may bring your mask for your overnight stay.   Contacts, glasses, hearing aids, dentures or partials may not be worn into surgery, please bring cases for these belongings   For patients admitted to the hospital, discharge time will be determined by your treatment team.   Patients discharged the day of surgery will not be allowed to drive home, and someone needs to stay with them for 24 hours.   SURGICAL WAITING ROOM VISITATION Patients having surgery or a procedure may have no more than 2 support people in the waiting area - these visitors may rotate.   Children under the age of 96 must have an adult with them who is not the patient. If the patient needs to stay  at the hospital during part of their recovery, the visitor guidelines for inpatient rooms apply. Pre-op nurse will coordinate an appropriate time for 1 support person to accompany patient in pre-op.  This support person may not rotate.   Please refer to [email or web address] for the visitor guidelines for Inpatients (after your surgery is over and you are in a regular room).    Special instructions:    Oral Hygiene is also important to reduce your risk of infection.   Remember - BRUSH YOUR TEETH THE MORNING OF SURGERY WITH YOUR REGULAR TOOTHPASTE   [facility]- Preparing For Surgery  Before surgery, you can play an important role. Because skin is not sterile, your skin needs to be as free of germs as possible. You can reduce the number of germs on your skin by washing with CHG (chlorahexidine gluconate) Soap before surgery.  CHG is an antiseptic cleaner which kills germs and bonds with the skin to continue killing germs even after washing.     Please do not use if you have an allergy to CHG or antibacterial soaps. If your skin becomes reddened/irritated stop using the CHG.  Do not shave (including legs and underarms) for at least 48 hours prior to first CHG shower. It is OK to shave your face.  Please follow these instructions carefully.     Shower the NIGHT BEFORE SURGERY and the MORNING OF SURGERY with CHG Soap.   If you chose to wash your hair, wash your hair first as usual with your normal shampoo. After you shampoo, rinse your hair and body thoroughly to remove the shampoo.  Then [employer] and genitals (private parts) with your normal soap and rinse thoroughly to remove soap.  After that Use CHG Soap as you would any other liquid soap. You can apply CHG directly to the skin and wash gently with a scrungie or a clean washcloth.   Apply the CHG Soap to your body ONLY FROM THE NECK DOWN.  Do not use on open wounds or open sores. Avoid contact with your eyes, ears, mouth and genitals (private parts). Wash Face and genitals (private parts)  with your normal soap.   Wash thoroughly, paying special attention to the area where your surgery will be performed.  Thoroughly rinse your body with warm water from the neck down.  DO NOT shower/wash with your normal soap after using and rinsing off the CHG Soap.  Pat yourself dry with a CLEAN TOWEL.  Wear CLEAN PAJAMAS to bed the night before surgery  Place CLEAN SHEETS on your bed the night before your  surgery  DO NOT SLEEP WITH PETS.   Day of Surgery: Take a shower with CHG soap. Wear Clean/Comfortable clothing the morning of surgery Do not apply any deodorants/lotions.   Remember to brush your teeth WITH YOUR REGULAR TOOTHPASTE.    If you received a COVID test during your pre-op visit, it is requested that you wear a mask when out in public, stay away from anyone that may not be feeling well, and notify your surgeon if you develop symptoms. If you have been in contact with anyone that has tested positive in the last 10 days, please notify your surgeon.    Please read over the following fact sheets that you were given.

## 2022-07-24 ENCOUNTER — Encounter (HOSPITAL_COMMUNITY): Payer: Self-pay

## 2022-07-24 ENCOUNTER — Encounter (HOSPITAL_COMMUNITY)
Admission: RE | Admit: 2022-07-24 | Discharge: 2022-07-24 | Disposition: A | Discharge status: Home / Self Care | Payer: Medicare Other | Source: Ambulatory Visit | Attending: General Surgery | Admitting: General Surgery

## 2022-07-24 ENCOUNTER — Other Ambulatory Visit: Payer: Self-pay

## 2022-07-24 VITALS — BP 115/81 | HR 75 | Temp 97.8°F | Resp 20 | Ht 63.0 in | Wt 220.3 lb

## 2022-07-24 DIAGNOSIS — Z86718 Personal history of other venous thrombosis and embolism: Secondary | ICD-10-CM | POA: Insufficient documentation

## 2022-07-24 DIAGNOSIS — E785 Hyperlipidemia, unspecified: Secondary | ICD-10-CM | POA: Diagnosis not present

## 2022-07-24 DIAGNOSIS — E876 Hypokalemia: Secondary | ICD-10-CM | POA: Insufficient documentation

## 2022-07-24 DIAGNOSIS — I2510 Atherosclerotic heart disease of native coronary artery without angina pectoris: Secondary | ICD-10-CM | POA: Insufficient documentation

## 2022-07-24 DIAGNOSIS — I10 Essential (primary) hypertension: Secondary | ICD-10-CM | POA: Diagnosis not present

## 2022-07-24 DIAGNOSIS — Z7901 Long term (current) use of anticoagulants: Secondary | ICD-10-CM | POA: Diagnosis not present

## 2022-07-24 DIAGNOSIS — R944 Abnormal results of kidney function studies: Secondary | ICD-10-CM | POA: Insufficient documentation

## 2022-07-24 DIAGNOSIS — Z853 Personal history of malignant neoplasm of breast: Secondary | ICD-10-CM | POA: Diagnosis not present

## 2022-07-24 DIAGNOSIS — Z01818 Encounter for other preprocedural examination: Secondary | ICD-10-CM

## 2022-07-24 DIAGNOSIS — Z01812 Encounter for preprocedural laboratory examination: Secondary | ICD-10-CM | POA: Insufficient documentation

## 2022-07-24 DIAGNOSIS — R911 Solitary pulmonary nodule: Secondary | ICD-10-CM | POA: Insufficient documentation

## 2022-07-24 DIAGNOSIS — Z923 Personal history of irradiation: Secondary | ICD-10-CM | POA: Diagnosis not present

## 2022-07-24 HISTORY — DX: Other forms of dyspnea: R06.09

## 2022-07-24 LAB — CBC
HCT: 39.1 % (ref 36.0–46.0)
Hemoglobin: 13.2 g/dL (ref 12.0–15.0)
MCH: 29.7 pg (ref 26.0–34.0)
MCHC: 33.8 g/dL (ref 30.0–36.0)
MCV: 87.9 fL (ref 80.0–100.0)
Platelets: 373 10*3/uL (ref 150–400)
RBC: 4.45 MIL/uL (ref 3.87–5.11)
RDW: 14.7 % (ref 11.5–15.5)
WBC: 9.3 10*3/uL (ref 4.0–10.5)
nRBC: 0 % (ref 0.0–0.2)

## 2022-07-24 LAB — BASIC METABOLIC PANEL
Anion gap: 12 (ref 5–15)
BUN: 25 mg/dL — ABNORMAL HIGH (ref 6–20)
CO2: 23 mmol/L (ref 22–32)
Calcium: 9.5 mg/dL (ref 8.9–10.3)
Chloride: 100 mmol/L (ref 98–111)
Creatinine, Ser: 1.23 mg/dL — ABNORMAL HIGH (ref 0.44–1.00)
GFR, Estimated: 50 mL/min — ABNORMAL LOW (ref >60)
Glucose, Bld: 117 mg/dL — ABNORMAL HIGH (ref 70–99)
Potassium: 3.1 mmol/L — ABNORMAL LOW (ref 3.5–5.1)
Sodium: 135 mmol/L (ref 135–145)

## 2022-07-24 NOTE — Progress Notes (Addendum)
PCP - Ronnell Freshwater, NP  [facility] (patient has not seen Leretha Pol yet, was previously seeing Lorrene Reid, PA-C) Pulmonologist- Dr. Verlee Monte CT surgery- Dr. Kipp Brood Cardiologist - denies  PPM/ICD - denies    Chest x-ray - 05/14/22 EKG - 05/14/22 Stress Test - denies ECHO - 03/01/19 Cardiac Cath - denies  Sleep Study - denies   Fasting Blood Sugar - N/A   Last dose of GLP1 agonist-  N/A   Blood Thinner Instructions: Follow your surgeon's instructions on when to stop Eliquis.  If no instructions were given by your surgeon then you will need to call the office to get those instructions.  Pt states she was not given any instructions. Pt states she will call Dr. Cristal Generous office today.  Aspirin Instructions: N/A  ERAS Protcol - ERAS + ensure  COVID TEST- N/A   Anesthesia review: yes, review med hx- hx of dyspnea on exertion- pt sees Dr Verlee Monte. Recent hiatal hernia repair with Dr. Kipp Brood. Pt on Eliquis d/t history of blood clot in lungs per patient and DVT- pt states she will contact Dr. Cristal Generous office for instructions on Eliquis.  Patient denies shortness of breath, fever, cough and chest pain at PAT appointment   All instructions explained to the patient, with a verbal understanding of the material. Patient agrees to go over the instructions while at home for a better understanding.  The opportunity to ask questions was provided.

## 2022-07-25 NOTE — Anesthesia Preprocedure Evaluation (Signed)
Anesthesia Evaluation  Patient identified by MRN, date of birth, ID band Patient awake    Reviewed: Allergy & Precautions, NPO status , Patient's Chart, lab work & pertinent test results  History of Anesthesia Complications Negative for: history of anesthetic complications  Airway Mallampati: II  TM Distance: >3 FB Neck ROM: Full    Dental  (+) Teeth Intact, Dental Advisory Given   Pulmonary neg pulmonary ROS   breath sounds clear to auscultation       Cardiovascular hypertension, Pt. on medications (-) angina + DVT  (-) Past MI  Rhythm:Regular  Echo 2020  1. Left ventricular ejection fraction, by visual estimation, is 60 to 65%. The left ventricle has normal function. Normal left ventricular size. There is no left ventricular hypertrophy.   2. Global right ventricle has normal systolic function.The right ventricular size is normal. No increase in right ventricular wall thickness.   3. Left atrial size was mildly dilated.   4. Right atrial size was normal.   5. The mitral valve is normal in structure. Trace mitral valve regurgitation. No evidence of mitral stenosis.   6. The tricuspid valve is normal in structure. Tricuspid valve regurgitation is mild.   7. The aortic valve is normal in structure. Aortic valve regurgitation was not visualized by color flow Doppler. Structurally normal aortic valve, with no evidence of sclerosis or stenosis.   8. The pulmonic valve was normal in structure. Pulmonic valve regurgitation is not visualized by color flow Doppler.   9. Normal pulmonary artery systolic pressure.  10. The inferior vena cava is normal in size with greater than 50% respiratory variability, suggesting right atrial pressure of 3 mmHg.      Neuro/Psych  PSYCHIATRIC DISORDERS Anxiety Depression    negative neurological ROS     GI/Hepatic Neg liver ROS, hiatal hernia,GERD  Medicated,,  Endo/Other    Morbid obesity   Renal/GU Renal InsufficiencyRenal diseaseLab Results      Component                Value               Date                      CREATININE               1.30 (H)            06/10/2022           Lab Results      Component                Value               Date                      K                        3.2 (L)             06/10/2022                Musculoskeletal  (+) Arthritis ,  Fibromyalgia -  Abdominal  (+) + obese  Peds  Hematology  (+) Blood dyscrasia Lab Results      Component                Value  Date                      WBC                      9.6                 06/10/2022                HGB                      13.2                06/10/2022                HCT                      40.3                06/10/2022                MCV                      90.4                06/10/2022                PLT                      367                 06/10/2022              Anesthesia Other Findings Day of surgery medications reviewed with the patient.  Right breast abscess  Reproductive/Obstetrics                             Anesthesia Physical Anesthesia Plan  ASA: 3  Anesthesia Plan: General   Post-op Pain Management: Tylenol PO (pre-op)* and Regional block*   Induction: Intravenous  PONV Risk Score and Plan: 3 and Midazolam, Dexamethasone and Ondansetron  Airway Management Planned: Oral ETT  Additional Equipment: None  Intra-op Plan:   Post-operative Plan: Extubation in OR  Informed Consent: I have reviewed the patients History and Physical, chart, labs and discussed the procedure including the risks, benefits and alternatives for the proposed anesthesia with the patient or authorized representative who has indicated his/her understanding and acceptance.     Dental advisory given  Plan Discussed with: CRNA  Anesthesia Plan Comments: (PAT note by Karoline Caldwell, PA-C: 62 year old female with history of DOE, HTN,  HLD, DVT, R breast cancer sp lumpectomy/XRT 2023, paraesophageal hernia sp repair/gastropexy 06/12/22 with Dr. Kipp Brood.  Recently evaluated by pulmonologist Dr. Verlee Monte for RML nodule.  She is noted to have a history of cough and dyspnea with exertion for the past couple years.  Last seen 06/25/2022.  Per note, nodule has been stable over 4 years of surveillance and felt to be likely benign.  She was recommended continue albuterol as needed for cough and SOB.  Follows with Dr. Kipp Brood for paraesophageal hernia s/p repair on 06/12/2022.  Last seen in follow-up on 07/18/2022 and noted to be doing well.  Patient reported at preadmission testing appointment she did not have instructions on holding her Eliquis.  She was instructed to call surgeons office for instructions.  I also called surgeons  office on 07/25/2022 and spoke with Elmyra Ricks who advised that she would reach out to Dr. Donne Hazel to make sure the patient had instructions.  Preop labs reviewed, mild hypokalemia potassium 3.1, creatinine mildly elevated 1.23, otherwise unremarkable.  EKG 06/10/2022: NSR.  Rate 74.  TTE 03/01/2019: 1. Left ventricular ejection fraction, by visual estimation, is 60 to  65%. The left ventricle has normal function. Normal left ventricular size.  There is no left ventricular hypertrophy.  2. Global right ventricle has normal systolic function.The right  ventricular size is normal. No increase in right ventricular wall  thickness.  3. Left atrial size was mildly dilated.  4. Right atrial size was normal.  5. The mitral valve is normal in structure. Trace mitral valve  regurgitation. No evidence of mitral stenosis.  6. The tricuspid valve is normal in structure. Tricuspid valve  regurgitation is mild.  7. The aortic valve is normal in structure. Aortic valve regurgitation  was not visualized by color flow Doppler. Structurally normal aortic  valve, with no evidence of sclerosis or stenosis.  8. The pulmonic  valve was normal in structure. Pulmonic valve  regurgitation is not visualized by color flow Doppler.  9. Normal pulmonary artery systolic pressure.  10. The inferior vena cava is normal in size with greater than 50%  respiratory variability, suggesting right atrial pressure of 3 mmHg.    )        Anesthesia Quick Evaluation

## 2022-07-25 NOTE — Progress Notes (Signed)
Anesthesia Chart Review:  62 year old female with history of DOE, HTN, HLD, DVT, R breast cancer sp lumpectomy/XRT 2023, paraesophageal hernia sp repair/gastropexy 06/12/22 with Dr. Kipp Brood.  Recently evaluated by pulmonologist Dr. Verlee Monte for RML nodule.  She is noted to have a history of cough and dyspnea with exertion for the past couple years.  Last seen 06/25/2022.  Per note, nodule has been stable over 4 years of surveillance and felt to be likely benign.  She was recommended continue albuterol as needed for cough and SOB.  Follows with Dr. Kipp Brood for paraesophageal hernia s/p repair on 06/12/2022.  Last seen in follow-up on 07/18/2022 and noted to be doing well.  Patient reported at preadmission testing appointment she did not have instructions on holding her Eliquis.  She was instructed to call surgeons office for instructions.  I also called surgeons office on 07/25/2022 and spoke with Elmyra Ricks who advised that she would reach out to Dr. Donne Hazel to make sure the patient had instructions.  Preop labs reviewed, mild hypokalemia potassium 3.1, creatinine mildly elevated 1.23, otherwise unremarkable.  EKG 06/10/2022: NSR.  Rate 74.  TTE 03/01/2019:  1. Left ventricular ejection fraction, by visual estimation, is 60 to  65%. The left ventricle has normal function. Normal left ventricular size.  There is no left ventricular hypertrophy.   2. Global right ventricle has normal systolic function.The right  ventricular size is normal. No increase in right ventricular wall  thickness.   3. Left atrial size was mildly dilated.   4. Right atrial size was normal.   5. The mitral valve is normal in structure. Trace mitral valve  regurgitation. No evidence of mitral stenosis.   6. The tricuspid valve is normal in structure. Tricuspid valve  regurgitation is mild.   7. The aortic valve is normal in structure. Aortic valve regurgitation  was not visualized by color flow Doppler. Structurally normal  aortic  valve, with no evidence of sclerosis or stenosis.   8. The pulmonic valve was normal in structure. Pulmonic valve  regurgitation is not visualized by color flow Doppler.   9. Normal pulmonary artery systolic pressure.  10. The inferior vena cava is normal in size with greater than 50%  respiratory variability, suggesting right atrial pressure of 3 mmHg.      [facility] Short Stay Center/Anesthesiology Phone [phone] 07/25/2022 1:55 PM

## 2022-07-29 ENCOUNTER — Ambulatory Visit (HOSPITAL_COMMUNITY): Payer: Medicare Other | Admitting: Physician Assistant

## 2022-07-29 ENCOUNTER — Other Ambulatory Visit: Payer: Self-pay

## 2022-07-29 ENCOUNTER — Encounter (HOSPITAL_COMMUNITY): Payer: Self-pay | Admitting: General Surgery

## 2022-07-29 ENCOUNTER — Ambulatory Visit (HOSPITAL_BASED_OUTPATIENT_CLINIC_OR_DEPARTMENT_OTHER): Payer: Medicare Other | Admitting: Anesthesiology

## 2022-07-29 ENCOUNTER — Observation Stay (HOSPITAL_COMMUNITY)
Admission: RE | Admit: 2022-07-29 | Discharge: 2022-07-31 | Disposition: A | Discharge status: Home / Self Care | Payer: Medicare Other | Attending: General Surgery | Admitting: General Surgery

## 2022-07-29 ENCOUNTER — Encounter (HOSPITAL_COMMUNITY)
Admission: RE | Disposition: A | Discharge status: Home / Self Care | Payer: Self-pay | Source: Home / Self Care | Attending: General Surgery

## 2022-07-29 DIAGNOSIS — K8020 Calculus of gallbladder without cholecystitis without obstruction: Secondary | ICD-10-CM

## 2022-07-29 DIAGNOSIS — F418 Other specified anxiety disorders: Secondary | ICD-10-CM | POA: Diagnosis not present

## 2022-07-29 DIAGNOSIS — Z86718 Personal history of other venous thrombosis and embolism: Secondary | ICD-10-CM | POA: Diagnosis not present

## 2022-07-29 DIAGNOSIS — I10 Essential (primary) hypertension: Secondary | ICD-10-CM | POA: Insufficient documentation

## 2022-07-29 DIAGNOSIS — Z853 Personal history of malignant neoplasm of breast: Secondary | ICD-10-CM | POA: Insufficient documentation

## 2022-07-29 DIAGNOSIS — I82409 Acute embolism and thrombosis of unspecified deep veins of unspecified lower extremity: Secondary | ICD-10-CM

## 2022-07-29 DIAGNOSIS — K8012 Calculus of gallbladder with acute and chronic cholecystitis without obstruction: Principal | ICD-10-CM | POA: Insufficient documentation

## 2022-07-29 DIAGNOSIS — Z79899 Other long term (current) drug therapy: Secondary | ICD-10-CM | POA: Diagnosis not present

## 2022-07-29 DIAGNOSIS — Z9049 Acquired absence of other specified parts of digestive tract: Secondary | ICD-10-CM

## 2022-07-29 HISTORY — PX: CHOLECYSTECTOMY: SHX55

## 2022-07-29 SURGERY — LAPAROSCOPIC CHOLECYSTECTOMY WITH INTRAOPERATIVE CHOLANGIOGRAM
Anesthesia: General | Site: Abdomen

## 2022-07-29 MED ORDER — AMISULPRIDE (ANTIEMETIC) 5 MG/2ML IV SOLN
10.0000 mg | Freq: Once | INTRAVENOUS | Status: AC | PRN
  Administered 2022-07-29: 10 mg via INTRAVENOUS

## 2022-07-29 MED ORDER — CHLORHEXIDINE GLUCONATE CLOTH 2 % EX PADS: 6.0000 | MEDICATED_PAD | Freq: Once | CUTANEOUS | Status: DC

## 2022-07-29 MED ORDER — FENTANYL CITRATE (PF) 100 MCG/2ML IJ SOLN: 100.0000 ug | Freq: Once | INTRAMUSCULAR | Status: AC

## 2022-07-29 MED ORDER — OXYCODONE HCL 5 MG/5ML PO SOLN: 5.0000 mg | Freq: Once | ORAL | Status: AC | PRN

## 2022-07-29 MED ORDER — ORAL CARE MOUTH RINSE: 15.0000 mL | Freq: Once | OROMUCOSAL | Status: AC

## 2022-07-29 MED ORDER — FLUOXETINE HCL 20 MG PO CAPS
40.0000 mg | ORAL_CAPSULE | Freq: Every day | ORAL | Status: DC
  Administered 2022-07-30 – 2022-07-31 (×2): 40 mg via ORAL
  Filled 2022-07-29 (×2): qty 2

## 2022-07-29 MED ORDER — LIDOCAINE 2% (20 MG/ML) 5 ML SYRINGE
INTRAMUSCULAR | Status: AC
  Filled 2022-07-29: qty 5

## 2022-07-29 MED ORDER — IPRATROPIUM-ALBUTEROL 0.5-2.5 (3) MG/3ML IN SOLN
RESPIRATORY_TRACT | Status: AC
  Filled 2022-07-29: qty 3

## 2022-07-29 MED ORDER — METOPROLOL SUCCINATE ER 25 MG PO TB24
25.0000 mg | ORAL_TABLET | Freq: Every day | ORAL | Status: DC
  Administered 2022-07-30: 25 mg via ORAL
  Filled 2022-07-29 (×2): qty 1

## 2022-07-29 MED ORDER — PROPOFOL 10 MG/ML IV BOLUS
INTRAVENOUS | Status: AC
  Filled 2022-07-29: qty 20

## 2022-07-29 MED ORDER — ACETAMINOPHEN 500 MG PO TABS
1000.0000 mg | ORAL_TABLET | Freq: Four times a day (QID) | ORAL | Status: DC
  Administered 2022-07-29 – 2022-07-30 (×3): 1000 mg via ORAL
  Filled 2022-07-29 (×3): qty 2

## 2022-07-29 MED ORDER — SODIUM CHLORIDE 0.9 % IR SOLN
Status: DC | PRN
  Administered 2022-07-29 (×2): 1000 mL

## 2022-07-29 MED ORDER — OXYCODONE HCL 5 MG PO TABS
5.0000 mg | ORAL_TABLET | ORAL | Status: DC | PRN
  Administered 2022-07-29 – 2022-07-31 (×5): 5 mg via ORAL
  Filled 2022-07-29 (×4): qty 1

## 2022-07-29 MED ORDER — METHOCARBAMOL 500 MG PO TABS
500.0000 mg | ORAL_TABLET | Freq: Three times a day (TID) | ORAL | Status: DC | PRN
  Administered 2022-07-29 – 2022-07-30 (×2): 500 mg via ORAL
  Filled 2022-07-29: qty 1

## 2022-07-29 MED ORDER — AMISULPRIDE (ANTIEMETIC) 5 MG/2ML IV SOLN
INTRAVENOUS | Status: AC
  Filled 2022-07-29: qty 4

## 2022-07-29 MED ORDER — INDOCYANINE GREEN 25 MG IV SOLR
INTRAVENOUS | Status: DC | PRN
  Administered 2022-07-29: 5 mg via INTRAVENOUS

## 2022-07-29 MED ORDER — SUGAMMADEX SODIUM 200 MG/2ML IV SOLN
INTRAVENOUS | Status: DC | PRN
  Administered 2022-07-29: 200 mg via INTRAVENOUS

## 2022-07-29 MED ORDER — FENTANYL CITRATE (PF) 100 MCG/2ML IJ SOLN
INTRAMUSCULAR | Status: AC
  Filled 2022-07-29: qty 2

## 2022-07-29 MED ORDER — METOPROLOL SUCCINATE ER 25 MG PO TB24
ORAL_TABLET | ORAL | Status: AC
  Filled 2022-07-29: qty 1

## 2022-07-29 MED ORDER — CHLORHEXIDINE GLUCONATE 0.12 % MT SOLN
15.0000 mL | Freq: Once | OROMUCOSAL | Status: AC
  Administered 2022-07-29: 15 mL via OROMUCOSAL
  Filled 2022-07-29: qty 15

## 2022-07-29 MED ORDER — METOPROLOL SUCCINATE ER 25 MG PO TB24
25.0000 mg | ORAL_TABLET | Freq: Once | ORAL | Status: AC
  Administered 2022-07-29: 25 mg via ORAL

## 2022-07-29 MED ORDER — OXYCODONE HCL 5 MG PO TABS
5.0000 mg | ORAL_TABLET | Freq: Once | ORAL | Status: AC | PRN
  Administered 2022-07-29: 5 mg via ORAL

## 2022-07-29 MED ORDER — ROCURONIUM BROMIDE 10 MG/ML (PF) SYRINGE
PREFILLED_SYRINGE | INTRAVENOUS | Status: AC
  Filled 2022-07-29: qty 10

## 2022-07-29 MED ORDER — ROCURONIUM BROMIDE 100 MG/10ML IV SOLN
INTRAVENOUS | Status: DC | PRN
  Administered 2022-07-29: 60 mg via INTRAVENOUS

## 2022-07-29 MED ORDER — IPRATROPIUM-ALBUTEROL 0.5-2.5 (3) MG/3ML IN SOLN
3.0000 mL | Freq: Once | RESPIRATORY_TRACT | Status: AC
  Administered 2022-07-29: 3 mL via RESPIRATORY_TRACT

## 2022-07-29 MED ORDER — BUPIVACAINE HCL (PF) 0.25 % IJ SOLN
INTRAMUSCULAR | Status: AC
  Filled 2022-07-29: qty 30

## 2022-07-29 MED ORDER — HYDRALAZINE HCL 20 MG/ML IJ SOLN
INTRAMUSCULAR | Status: AC
  Filled 2022-07-29: qty 1

## 2022-07-29 MED ORDER — PROPOFOL 10 MG/ML IV BOLUS
INTRAVENOUS | Status: DC | PRN
  Administered 2022-07-29: 170 mg via INTRAVENOUS

## 2022-07-29 MED ORDER — AMLODIPINE BESYLATE 5 MG PO TABS
5.0000 mg | ORAL_TABLET | Freq: Every day | ORAL | Status: DC
  Administered 2022-07-30 – 2022-07-31 (×2): 5 mg via ORAL
  Filled 2022-07-29 (×2): qty 1

## 2022-07-29 MED ORDER — MIDAZOLAM HCL 2 MG/2ML IJ SOLN
INTRAMUSCULAR | Status: AC
  Administered 2022-07-29: 2 mg via INTRAVENOUS
  Filled 2022-07-29: qty 2

## 2022-07-29 MED ORDER — SIMETHICONE 80 MG PO CHEW: 40.0000 mg | CHEWABLE_TABLET | Freq: Four times a day (QID) | ORAL | Status: DC | PRN

## 2022-07-29 MED ORDER — ONDANSETRON 4 MG PO TBDP
4.0000 mg | ORAL_TABLET | Freq: Four times a day (QID) | ORAL | Status: DC | PRN
  Administered 2022-07-29: 4 mg via ORAL
  Filled 2022-07-29: qty 1

## 2022-07-29 MED ORDER — DEXAMETHASONE SODIUM PHOSPHATE 10 MG/ML IJ SOLN
INTRAMUSCULAR | Status: AC
  Filled 2022-07-29: qty 1

## 2022-07-29 MED ORDER — MORPHINE SULFATE (PF) 2 MG/ML IV SOLN: 1.0000 mg | INTRAVENOUS | Status: DC | PRN

## 2022-07-29 MED ORDER — MIDAZOLAM HCL 2 MG/2ML IJ SOLN: 2.0000 mg | Freq: Once | INTRAMUSCULAR | Status: AC

## 2022-07-29 MED ORDER — DEXAMETHASONE SODIUM PHOSPHATE 10 MG/ML IJ SOLN
INTRAMUSCULAR | Status: DC | PRN
  Administered 2022-07-29: 10 mg via INTRAVENOUS

## 2022-07-29 MED ORDER — PROMETHAZINE HCL 25 MG/ML IJ SOLN: 6.2500 mg | INTRAMUSCULAR | Status: DC | PRN

## 2022-07-29 MED ORDER — EPHEDRINE 5 MG/ML INJ
INTRAVENOUS | Status: AC
  Filled 2022-07-29: qty 5

## 2022-07-29 MED ORDER — ONDANSETRON HCL 4 MG/2ML IJ SOLN
4.0000 mg | Freq: Four times a day (QID) | INTRAMUSCULAR | Status: DC | PRN
  Administered 2022-07-30: 4 mg via INTRAVENOUS
  Filled 2022-07-29: qty 2

## 2022-07-29 MED ORDER — FENTANYL CITRATE (PF) 100 MCG/2ML IJ SOLN
INTRAMUSCULAR | Status: AC
  Administered 2022-07-29: 100 ug via INTRAVENOUS
  Filled 2022-07-29: qty 2

## 2022-07-29 MED ORDER — ONDANSETRON HCL 4 MG/2ML IJ SOLN
INTRAMUSCULAR | Status: AC
  Filled 2022-07-29: qty 2

## 2022-07-29 MED ORDER — HYDROMORPHONE HCL 1 MG/ML IJ SOLN
INTRAMUSCULAR | Status: AC
  Filled 2022-07-29: qty 0.5

## 2022-07-29 MED ORDER — ENSURE PRE-SURGERY PO LIQD: 296.0000 mL | Freq: Once | ORAL | Status: DC

## 2022-07-29 MED ORDER — HYDROCHLOROTHIAZIDE 25 MG PO TABS
50.0000 mg | ORAL_TABLET | Freq: Every day | ORAL | Status: DC
  Administered 2022-07-30 – 2022-07-31 (×2): 50 mg via ORAL
  Filled 2022-07-29 (×3): qty 2

## 2022-07-29 MED ORDER — CLONIDINE HCL 0.1 MG PO TABS
0.1000 mg | ORAL_TABLET | Freq: Two times a day (BID) | ORAL | Status: DC
  Administered 2022-07-29 – 2022-07-31 (×4): 0.1 mg via ORAL
  Filled 2022-07-29 (×4): qty 1

## 2022-07-29 MED ORDER — BUPIVACAINE HCL (PF) 0.25 % IJ SOLN
INTRAMUSCULAR | Status: DC | PRN
  Administered 2022-07-29: 15 mL

## 2022-07-29 MED ORDER — 0.9 % SODIUM CHLORIDE (POUR BTL) OPTIME
TOPICAL | Status: DC | PRN
  Administered 2022-07-29: 1000 mL

## 2022-07-29 MED ORDER — LACTATED RINGERS IV SOLN: INTRAVENOUS | Status: DC

## 2022-07-29 MED ORDER — ROPIVACAINE HCL 5 MG/ML IJ SOLN
INTRAMUSCULAR | Status: DC | PRN
  Administered 2022-07-29 (×2): 25 mL via PERINEURAL

## 2022-07-29 MED ORDER — DEXAMETHASONE SODIUM PHOSPHATE 4 MG/ML IJ SOLN
INTRAMUSCULAR | Status: DC | PRN
  Administered 2022-07-29 (×2): 2.5 mg via PERINEURAL

## 2022-07-29 MED ORDER — ACETAMINOPHEN 500 MG PO TABS
1000.0000 mg | ORAL_TABLET | ORAL | Status: AC
  Administered 2022-07-29: 1000 mg via ORAL
  Filled 2022-07-29: qty 2

## 2022-07-29 MED ORDER — HYDROMORPHONE HCL 1 MG/ML IJ SOLN
INTRAMUSCULAR | Status: DC | PRN
  Administered 2022-07-29: .5 mg via INTRAVENOUS

## 2022-07-29 MED ORDER — METHOCARBAMOL 1000 MG/10ML IJ SOLN
500.0000 mg | Freq: Three times a day (TID) | INTRAVENOUS | Status: DC | PRN
  Filled 2022-07-29: qty 5

## 2022-07-29 MED ORDER — CLONIDINE HCL (ANALGESIA) 100 MCG/ML EP SOLN
EPIDURAL | Status: DC | PRN
  Administered 2022-07-29 (×2): 40 ug

## 2022-07-29 MED ORDER — HEMOSTATIC AGENTS (NO CHARGE) OPTIME
TOPICAL | Status: DC | PRN
  Administered 2022-07-29: 3 via TOPICAL

## 2022-07-29 MED ORDER — FENTANYL CITRATE (PF) 250 MCG/5ML IJ SOLN
INTRAMUSCULAR | Status: AC
  Filled 2022-07-29: qty 5

## 2022-07-29 MED ORDER — FENTANYL CITRATE (PF) 250 MCG/5ML IJ SOLN
INTRAMUSCULAR | Status: DC | PRN
  Administered 2022-07-29 (×5): 50 ug via INTRAVENOUS

## 2022-07-29 MED ORDER — LIDOCAINE HCL (CARDIAC) PF 100 MG/5ML IV SOSY
PREFILLED_SYRINGE | INTRAVENOUS | Status: DC | PRN
  Administered 2022-07-29: 100 mg via INTRATRACHEAL

## 2022-07-29 MED ORDER — ALBUTEROL SULFATE HFA 108 (90 BASE) MCG/ACT IN AERS: 2.0000 | INHALATION_SPRAY | Freq: Four times a day (QID) | RESPIRATORY_TRACT | Status: DC | PRN

## 2022-07-29 MED ORDER — SODIUM CHLORIDE 0.9 % IV SOLN: INTRAVENOUS | Status: DC

## 2022-07-29 MED ORDER — ROPIVACAINE HCL 5 MG/ML IJ SOLN: INTRAMUSCULAR | Status: DC | PRN

## 2022-07-29 MED ORDER — METHOCARBAMOL 500 MG PO TABS
ORAL_TABLET | ORAL | Status: AC
  Filled 2022-07-29: qty 1

## 2022-07-29 MED ORDER — OXYCODONE HCL 5 MG PO TABS
ORAL_TABLET | ORAL | Status: AC
  Filled 2022-07-29: qty 2

## 2022-07-29 MED ORDER — FENTANYL CITRATE (PF) 100 MCG/2ML IJ SOLN
25.0000 ug | INTRAMUSCULAR | Status: DC | PRN
  Administered 2022-07-29: 50 ug via INTRAVENOUS

## 2022-07-29 MED ORDER — CEFAZOLIN SODIUM-DEXTROSE 2-4 GM/100ML-% IV SOLN
2.0000 g | INTRAVENOUS | Status: AC
  Administered 2022-07-29: 2 g via INTRAVENOUS
  Filled 2022-07-29: qty 100

## 2022-07-29 MED ORDER — ONDANSETRON HCL 4 MG/2ML IJ SOLN
INTRAMUSCULAR | Status: DC | PRN
  Administered 2022-07-29: 4 mg via INTRAVENOUS

## 2022-07-29 SURGICAL SUPPLY — 47 items
ADH SKN CLS APL DERMABOND .7 (GAUZE/BANDAGES/DRESSINGS) ×1
APL PRP STRL LF DISP 70% ISPRP (MISCELLANEOUS) ×1
APPLIER CLIP 5 13 M/L LIGAMAX5 (MISCELLANEOUS) ×1
APR CLP MED LRG 5 ANG JAW (MISCELLANEOUS) ×1
BAG COUNTER SPONGE SURGICOUNT (BAG) ×2 IMPLANT
BAG SPEC RTRVL 10 TROC 200 (ENDOMECHANICALS) ×1
BAG SPNG CNTER NS LX DISP (BAG) ×1
BLADE CLIPPER SURG (BLADE) IMPLANT
CANISTER SUCT 3000ML PPV (MISCELLANEOUS) ×2 IMPLANT
CHLORAPREP W/TINT 26 (MISCELLANEOUS) ×2 IMPLANT
CLIP APPLIE 5 13 M/L LIGAMAX5 (MISCELLANEOUS) ×2 IMPLANT
COVER MAYO STAND STRL (DRAPES) IMPLANT
COVER SURGICAL LIGHT HANDLE (MISCELLANEOUS) ×2 IMPLANT
DERMABOND ADVANCED .7 DNX12 (GAUZE/BANDAGES/DRESSINGS) ×2 IMPLANT
DRAPE C-ARM 42X120 X-RAY (DRAPES) IMPLANT
ELECT REM PT RETURN 9FT ADLT (ELECTROSURGICAL) ×1
ELECTRODE REM PT RTRN 9FT ADLT (ELECTROSURGICAL) ×2 IMPLANT
GLOVE BIO SURGEON STRL SZ7 (GLOVE) ×2 IMPLANT
GLOVE BIOGEL PI IND STRL 7.5 (GLOVE) ×2 IMPLANT
GOWN STRL REUS W/ TWL LRG LVL3 (GOWN DISPOSABLE) ×6 IMPLANT
GOWN STRL REUS W/TWL LRG LVL3 (GOWN DISPOSABLE) ×3
GRASPER SUT TROCAR 14GX15 (MISCELLANEOUS) ×2 IMPLANT
HEMOSTAT SNOW SURGICEL 2X4 (HEMOSTASIS) IMPLANT
IRRIG SUCT STRYKERFLOW 2 WTIP (MISCELLANEOUS) ×1
IRRIGATION SUCT STRKRFLW 2 WTP (MISCELLANEOUS) ×2 IMPLANT
KIT BASIN OR (CUSTOM PROCEDURE TRAY) ×2 IMPLANT
KIT IMAGING PINPOINTPAQ (MISCELLANEOUS) IMPLANT
KIT TURNOVER KIT B (KITS) ×2 IMPLANT
NS IRRIG 1000ML POUR BTL (IV SOLUTION) ×2 IMPLANT
PAD ARMBOARD 7.5X6 YLW CONV (MISCELLANEOUS) ×2 IMPLANT
POUCH RETRIEVAL ECOSAC 10 (ENDOMECHANICALS) ×2 IMPLANT
POUCH RETRIEVAL ECOSAC 10MM (ENDOMECHANICALS) ×1
SCISSORS LAP 5X35 DISP (ENDOMECHANICALS) ×2 IMPLANT
SET CHOLANGIOGRAPH 5 50 .035 (SET/KITS/TRAYS/PACK) IMPLANT
SET TUBE SMOKE EVAC HIGH FLOW (TUBING) ×2 IMPLANT
SLEEVE Z-THREAD 5X100MM (TROCAR) ×4 IMPLANT
SPECIMEN JAR SMALL (MISCELLANEOUS) ×2 IMPLANT
STRIP CLOSURE SKIN 1/2X4 (GAUZE/BANDAGES/DRESSINGS) ×2 IMPLANT
SUT MNCRL AB 4-0 PS2 18 (SUTURE) ×2 IMPLANT
SUT VICRYL 0 UR6 27IN ABS (SUTURE) ×2 IMPLANT
TOWEL GREEN STERILE (TOWEL DISPOSABLE) ×2 IMPLANT
TOWEL GREEN STERILE FF (TOWEL DISPOSABLE) ×2 IMPLANT
TRAY LAPAROSCOPIC MC (CUSTOM PROCEDURE TRAY) ×2 IMPLANT
TROCAR BALLN 12MMX100 BLUNT (TROCAR) ×2 IMPLANT
TROCAR Z-THREAD OPTICAL 5X100M (TROCAR) ×2 IMPLANT
WARMER LAPAROSCOPE (MISCELLANEOUS) ×2 IMPLANT
WATER STERILE IRR 1000ML POUR (IV SOLUTION) ×2 IMPLANT

## 2022-07-29 NOTE — Op Note (Signed)
Preoperative diagnosis: Symptomatic cholelithiasis Postoperative diagnosis: Same as above Procedure: Laparoscopic cholecystectomy Surgeon: Dr. Serita Grammes Anesthesia: General with bilateral tap blocks Estimated blood loss: 50 cc Specimens: Gallbladder and contents to pathology Complications: None Drains: None Sponge needle count was correct at completion Disposition recovery stable condition  Indications:62 year old female who I know from previous care for breast cancer. She had a paraesophageal hernia repair in the past and had a small recurrent hiatal hernia that was symptomatic. She described some dysphagia as well as reflux symptoms. She is also had some right upper quadrant pain after eating for some time. This is gotten worse over the last several weeks since she had surgery. On February 1 she underwent a repeat paraesophageal hernia repair. She also underwent a gastropexy at the same time it appears. On her preoperative CT of her chest done on January 24 she had noted a small right upper lobe lung nodule that ends up being benign with no further workup needed. She also has some fairly significant gallstones that are present on the CT scan.. She describes worsening right upper quadrant and right-sided pain especially after eating the diet that she is on.  We discussed proceeding with laparoscopic cholecystectomy.  Procedure: After informed consent was obtained she was taken to the operating room.  She had undergone bilateral tap blocks.  She was given antibiotics.  ICG was given.  SCDs were placed.  She was then placed under general anesthesia without complication.  She was prepped and draped in a sterile sterile surgical fashion.  A surgical timeout was then performed.  I infiltrated Marcaine below the umbilicus.  I made a vertical incision.  I carried this to the fascia.  I incised the fascia sharply.  I entered the peritoneum bluntly without injury.  I placed a 0 Vicryl pursestring suture  through the fascia.  I then inserted a Hassan trocar and insufflated the abdomen to 15 mmHg pressure.  She did not have a lot of scarring in the region of the gallbladder from the recent surgery.  Her gallbladder clearly had pretty significant chronic cholecystitis.  I ends inserted 3 additional 5 mm trocars in the epigastrium and right side of the abdomen under direct vision without complication.  The gallbladder was then retracted cephalad.  The omentum and the duodenum were adherent and these were taken down in a combination of blunt dissection as well as sharp dissection.  The gallbladder was then retracted lateral.  With some difficulty I was eventually able to dissect the entire triangle and get the critical view of safety.  I confirmed the cystic duct and common duct with the ICG dye and fluorescence mode.  I then clipped the artery and divided this.  I clipped the cystic duct and divided this in a similar fashion.  The clips completely traverse the duct and the duct was viable.  There was a small posterior branch that was bleeding I placed an additional clip on.  The gallbladder was removed with some difficulty from the liver bed due to its scarring.  I did not enter into the gallbladder.  This was then placed in a retrieval bag and removed from the abdomen.  Hemostasis was eventually obtained.  I placed snow in the gallbladder fossa due to its friability.  I then removed the [facility] trocar and time I pursestring down.  I placed an additional 0 Vicryl suture to completely obliterate the umbilical defect.  I then remove the remaining trocars and desufflated the abdomen.  I closed  these with 4 Monocryl and glue.  She tolerated this well was extubated and transferred to recovery stable.

## 2022-07-29 NOTE — Interval H&P Note (Signed)
History and Physical Interval Note:  07/29/2022 12:09 PM  [name]  has presented today for surgery, with the diagnosis of GALLSTONES.  The various methods of treatment have been discussed with the patient and family. After consideration of risks, benefits and other options for treatment, the patient has consented to  Procedure(s) with comments: LAPAROSCOPIC CHOLECYSTECTOMY WITH  ICG DYE (N/A) - RNFA  ICG DYE  TAP BLOCK as a surgical intervention.  The patient's history has been reviewed, patient examined, no change in status, stable for surgery.  I have reviewed the patient's chart and labs.  Questions were answered to the patient's satisfaction.     Rolm Bookbinder

## 2022-07-29 NOTE — Progress Notes (Signed)
I went in to check on patient and she stated she "was not feeling so well". I asked what was bothering her and she stated she is breathing but every 3 to 4 breaths she feels as if she has been holding her breath for a long time. Patient has clear lung sounds and vitals are stable. Dr. Kae Heller notified and I was instructed to give patient an incentive spirometer and if that does not help to reach back out.

## 2022-07-29 NOTE — Transfer of Care (Signed)
Immediate Anesthesia Transfer of Care Note  Patient: [name]  Procedure(s) Performed: LAPAROSCOPIC CHOLECYSTECTOMY WITH  ICG DYE (Abdomen)  Patient Location: PACU  Anesthesia Type:General  Level of Consciousness: drowsy and patient cooperative  Airway & Oxygen Therapy: Patient Spontanous Breathing  Post-op Assessment: Report given to RN and Post -op Vital signs reviewed and stable  Post vital signs: Reviewed and stable  Last Vitals:  Vitals Value Taken Time  BP 135/75 07/29/22 1549  Temp    Pulse 67 07/29/22 1550  Resp 13 07/29/22 1550  SpO2 90 % 07/29/22 1550  Vitals shown include unvalidated device data.  Last Pain:  Vitals:   07/29/22 1207  PainSc: 0-No pain         Complications: No notable events documented.

## 2022-07-29 NOTE — Discharge Instructions (Signed)
[facility] LAPAROSCOPIC SURGERY: POST OP INSTRUCTIONS  Always review your discharge instruction sheet given to you by the facility where your surgery was performed. IF YOU HAVE DISABILITY OR FAMILY LEAVE FORMS, YOU MUST BRING THEM TO THE OFFICE FOR PROCESSING.   DO NOT GIVE THEM TO YOUR DOCTOR.  A prescription for pain medication may be given to you upon discharge.  Take your pain medication as prescribed, if needed.  If narcotic pain medicine is not needed, then you may take acetaminophen (Tylenol), naprosyn (Alleve), or ibuprofen (Advil) as needed. Take your usually prescribed medications unless otherwise directed. If you need a refill on your pain medication, please contact your pharmacy.  They will contact our office to request authorization. Prescriptions will not be filled after 5pm or on week-ends. You should follow a light diet the first few days after arrival home, such as soup and crackers, etc.  Be sure to include lots of fluids daily. Most patients will experience some swelling and bruising in the area of the incisions.  Ice packs will help.  Swelling and bruising can take several days to resolve.  It is common to experience some constipation if taking pain medication after surgery.  Increasing fluid intake and taking a stool softener (such as Colace) will usually help or prevent this problem from occurring.  A mild laxative (Milk of Magnesia or Miralax) should be taken according to package instructions if there are no bowel movements after 48 hours. Unless discharge instructions indicate otherwise, you may remove your bandages 48 hours after surgery, and you may shower at that time.  You may have steri-strips (small skin tapes) in place directly over the incision.  These strips should be left on the skin for 7-10 days.  If your surgeon used skin glue on the incision, you may shower in 24 hours.  The glue will flake off over the next  2-3 weeks.  Any sutures or staples will be removed at the office during your follow-up visit. ACTIVITIES:  You may resume regular (light) daily activities beginning the next day--such as daily self-care, walking, climbing stairs--gradually increasing activities as tolerated.  You may have sexual intercourse when it is comfortable.  Refrain from any heavy lifting or straining until approved by your doctor. You may drive when you are no longer taking prescription pain medication, you can comfortably wear a seatbelt, and you can safely maneuver your car and apply brakes. RETURN TO WORK:  __________________________________________________________ You should see your doctor in the office for a follow-up appointment approximately 2-3 weeks after your surgery.  Make sure that you call for this appointment within a day or two after you arrive home to insure a convenient appointment time. OTHER INSTRUCTIONS: __________________________________________________________________________________________________________________________ __________________________________________________________________________________________________________________________ WHEN TO CALL YOUR DOCTOR: Fever over 101.0 Inability to urinate Continued bleeding from incision. Increased pain, redness, or drainage from the incision. Increasing abdominal pain  The clinic staff is available to answer your questions during regular business hours.  Please don't hesitate to call and ask to speak to one of the nurses for clinical concerns.  If you have a medical emergency, go to the nearest emergency room or call 911.  A surgeon from [facility] is always on call at the hospital. [address], [address], [place], [place]  [address] ? [address] [address], [place], [place]   [address] [phone] ? [phone] ? FAX [phone] Web site: [email or web address]  

## 2022-07-29 NOTE — Anesthesia Procedure Notes (Signed)
Anesthesia Regional Block: TAP block   Pre-Anesthetic Checklist: , timeout performed,  Correct Patient, Correct Site, Correct Laterality,  Correct Procedure, Correct Position, site marked,  Risks and benefits discussed,  Surgical consent,  Pre-op evaluation,  At surgeon's request and post-op pain management  Laterality: Right and Left  Prep: chloraprep       Needles:  Injection technique: Single-shot  Needle Type: Echogenic Stimulator Needle      Needle Gauge: 21     Additional Needles:   Procedures:,,,, ultrasound used (permanent image in chart),,    Narrative:  Start time: 07/29/2022 12:40 PM End time: 07/29/2022 1:10 PM Injection made incrementally with aspirations every 5 mL.  Performed by: Personally  Anesthesiologist: Nolon Nations, MD  Additional Notes: BP cuff, SpO2 and EKG monitors applied. Sedation begun.  Anesthetic injected incrementally, slowly, and after neg aspirations under direct ultrasound guidance. Good fascial spread noted. Patient tolerated well.

## 2022-07-29 NOTE — Anesthesia Postprocedure Evaluation (Signed)
Anesthesia Post Note  Patient: [name]  Procedure(s) Performed: LAPAROSCOPIC CHOLECYSTECTOMY WITH  ICG DYE (Abdomen)     Patient location during evaluation: PACU Anesthesia Type: General Level of consciousness: sedated and patient cooperative Pain management: pain level controlled Vital Signs Assessment: post-procedure vital signs reviewed and stable Respiratory status: spontaneous breathing Cardiovascular status: stable Anesthetic complications: no   No notable events documented.  Last Vitals:  Vitals:   07/29/22 1739 07/29/22 1944  BP: (!) 110/57 123/69  Pulse: 65 69  Resp: 15 18  Temp: 36.5 C 36.5 C  SpO2: 92% 95%    Last Pain:  Vitals:   07/29/22 1739  TempSrc: Oral  PainSc: 0-No pain                 Nolon Nations

## 2022-07-29 NOTE — H&P (Signed)
62 year old female who I know from previous care for breast cancer. She had a paraesophageal hernia repair in the past and had a small recurrent hiatal hernia that was symptomatic. She described some dysphagia as well as reflux symptoms. She is also had some right upper quadrant pain after eating for some time. This is gotten worse over the last several weeks since she had surgery. On February 1 she underwent a repeat paraesophageal hernia repair. She also underwent a gastropexy at the same time it appears. On her preoperative CT of her chest done on January 24 she had noted a small right upper lobe lung nodule that ends up being benign with no further workup needed. She also has some fairly significant gallstones that are present on the CT scan. She has done okay after her paraesophageal hernia repair. She describes worsening right upper quadrant and right-sided pain especially after eating the diet that she is on. She is referred to discuss cholecystectomy.  Review of Systems: A complete review of systems was obtained from the patient. I have reviewed this information and discussed as appropriate with the patient. See HPI as well for other ROS.  Review of Systems Gastrointestinal: Positive for abdominal pain and heartburn. All other systems reviewed and are negative.   Medical History: Past Medical History: Diagnosis Date Anemia Anxiety Arthritis DVT (deep venous thrombosis) (CMS-HCC) GERD (gastroesophageal reflux disease) Hypertension  Patient Active Problem List Diagnosis Rheumatoid arthritis (CMS-HCC) Essential hypertension Anxiety SOB (shortness of breath) Neck pain Bleeds easily (CMS-HCC) Easy bruising History of depression Nervousness Malignant neoplasm of upper-outer quadrant of right breast in female, estrogen receptor positive Abscess of right breast  Past Surgical History: Procedure Laterality Date feet surgery Bilateral Date unknown left wrist Pins placed REPAIR  HIATAL HERNIA Aug/Sept 2022   Allergies Allergen Reactions Tramadol Itching "bugs crawling all over"  Current Outpatient Medications on File Prior to Visit Medication Sig Dispense Refill FLUoxetine (PROZAC) 20 MG tablet Take 1.5 tablets (30 mg total) by mouth once daily hydroCHLOROthiazide (HYDRODIURIL) 50 MG tablet Take 1 tablet (50 mg total) by mouth once daily lisinopriL (ZESTRIL) 40 MG tablet Take 1 tablet (40 mg total) by mouth once daily metoprolol succinate (TOPROL-XL) 25 MG XL tablet Take 1 tablet (25 mg total) by mouth once daily  No current facility-administered medications on file prior to visit.  Family History Problem Relation Age of Onset Stroke Mother Skin cancer Father Lung cancer Father Prostate cancer Father Breast cancer Sister Breast cancer Paternal Aunt Lung cancer Maternal Grandfather Lung cancer Paternal Grandfather Diabetes Son   Social History  Tobacco Use Smoking Status Never Smokeless Tobacco Never   Social History  Socioeconomic History Marital status: Unknown Tobacco Use Smoking status: Never Smokeless tobacco: Never Vaping Use Vaping Use: Never used Substance and Sexual Activity Alcohol use: Never Drug use: Never  Objective:  Vitals: 07/14/22 1509 07/14/22 1510 Weight: (!) 101.2 kg (223 lb) Height: 161.3 cm (5' 3.5") PainSc: 0-No pain  Body mass index is 38.88 kg/m.  Physical Exam Vitals reviewed. Constitutional: Appearance: Normal appearance. Abdominal: Palpations: Abdomen is soft. Tenderness: There is abdominal tenderness (with palpation) in the right upper quadrant. Negative signs include Murphy's sign. Hernia: No hernia is present. Comments: Multiple well healed scars Neurological: Mental Status: She is alert.   Assessment and Plan:  Gallstones  Laparoscopic cholecystectomy, ICG dye  I do think that she has symptoms related to her gallbladder although not all of them. I think surgery indicated but  likely will relieve symptoms only partially.  I discussed the procedure in detail. We discussed the risks and benefits of a laparoscopic cholecystectomy and possible cholangiogram including, but not limited to bleeding, infection, injury to surrounding structures such as the intestine or liver, bile leak, retained gallstones, need to convert to an open procedure, prolonged diarrhea, blood clots such as DVT, common bile duct injury, anesthesia risks, and possible need for additional procedures. The likelihood of improvement in symptoms and return to the patient's normal status is good. We discussed the typical post-operative recovery course. Will plan to schedule in a couple weeks. I think scarring from prior surgery should not be prohibitive for lap chole.

## 2022-07-29 NOTE — Anesthesia Procedure Notes (Signed)
Procedure Name: Intubation Date/Time: 07/29/2022 2:33 PM  Performed by: Lance Coon, CRNAPre-anesthesia Checklist: Patient identified, Emergency Drugs available, Suction available, Patient being monitored and Timeout performed Patient Re-evaluated:Patient Re-evaluated prior to induction Oxygen Delivery Method: Circle system utilized Preoxygenation: Pre-oxygenation with 100% oxygen Induction Type: IV induction Ventilation: Mask ventilation without difficulty Laryngoscope Size: Miller and 3 Grade View: Grade I Tube type: Oral Tube size: 7.0 mm Number of attempts: 1 Airway Equipment and Method: Stylet Placement Confirmation: ETT inserted through vocal cords under direct vision, positive ETCO2 and breath sounds checked- equal and bilateral Secured at: 21 cm Tube secured with: Tape Dental Injury: Teeth and Oropharynx as per pre-operative assessment

## 2022-07-30 ENCOUNTER — Encounter (HOSPITAL_COMMUNITY): Payer: Self-pay | Admitting: General Surgery

## 2022-07-30 DIAGNOSIS — K8012 Calculus of gallbladder with acute and chronic cholecystitis without obstruction: Secondary | ICD-10-CM | POA: Diagnosis not present

## 2022-07-30 MED ORDER — SODIUM CHLORIDE 0.9 % IV BOLUS
500.0000 mL | Freq: Once | INTRAVENOUS | Status: AC
  Administered 2022-07-30: 500 mL via INTRAVENOUS

## 2022-07-30 NOTE — Care Management Obs Status (Cosign Needed)
[facility] NOTIFICATION   Patient Details  Name: [name] MRN: [id number] Date of Birth: 23-Mar-1961   Medicare Observation Status Notification Given:  Yes    Curlene Labrum, RN 07/30/2022, 8:57 AM

## 2022-07-30 NOTE — Plan of Care (Signed)
Pt admitted to 2 west from PACU, pt alert and oriented x4, pt on room air, pt has 4 incision sites to abdomen that are clean and dry, incisions covered with steri-strips and skin glue. RN charted under LDA placed by PACU. Pt was ambulatory with standby assist.

## 2022-07-30 NOTE — Progress Notes (Signed)
Mobility Specialist - Progress Note   07/30/22 1030  Mobility  Activity Ambulated with assistance in hallway  Level of Assistance Standby assist, set-up cues, supervision of patient - no hands on  Assistive Device None  Distance Ambulated (ft) 200 ft  Activity Response Tolerated poorly  Mobility Referral Yes  $Mobility charge 1 Mobility   Pt was received in bed and agreeable to mobility. Pt required x2 standing rest break d/t abdominal pain throughout ambulation. Up return to room pt c/o feeling nausea and out of breath. Pt was coached through pursed lip breathing. Pt was returned to bed with all needs met. RN notified.  Franki Monte  Mobility Specialist Please contact via Solicitor or Rehab office at [phone]

## 2022-07-30 NOTE — TOC Initial Note (Signed)
Transition of Care [facility]) - Initial/Assessment Note    Patient Details  Name: [name] MRN: [id number] Date of Birth: 06/27/1960  Transition of Care [facility]) CM/SW Contact:    Curlene Labrum, RN Phone Number: 07/30/2022, 10:36 AM  Clinical Narrative:                 Patient provided with Medicare Observation letter.  No other TOC needs determined for return to home at this time.  Transition of Care [facility]) Screening Note   Patient Details  Name: [name] Date of Birth: July 26, 1960   Transition of Care [facility]) CM/SW Contact:    Curlene Labrum, RN Phone Number: 07/30/2022, 10:37 AM    Transition of Care Department [facility]) has reviewed patient and no TOC needs have been identified at this time. We will continue to monitor patient advancement through interdisciplinary progression rounds. If new patient transition needs arise, please place a TOC consult.    Expected Discharge Plan: Home/Self Care Barriers to Discharge: Continued Medical Work up   Patient Goals and CMS Choice Patient states their goals for this hospitalization and ongoing recovery are:: To get better and go home CMS Medicare.gov Compare Post Acute Care list provided to:: Patient Choice offered to / list presented to : Patient [place] ownership interest in [facility].provided to:: Patient    Expected Discharge Plan and Services   Discharge Planning Services: CM Consult   Living arrangements for the past 2 months: [place]                                      Prior Living Arrangements/Services Living arrangements for the past 2 months: Single Family Home Lives with:: Self Patient language and need for interpreter reviewed:: Yes Do you feel safe going back to the place where you live?: Yes      Need for Family Participation in Patient Care: Yes (Comment) Care giver support system in place?: Yes (comment)   Criminal Activity/Legal Involvement Pertinent to  Current Situation/Hospitalization: No - Comment as needed  Activities of Daily Living Home Assistive Devices/Equipment: Eyeglasses ADL Screening (condition at time of admission) Patient's cognitive ability adequate to safely complete daily activities?: Yes Is the patient deaf or have difficulty hearing?: No Does the patient have difficulty seeing, even when wearing glasses/contacts?: No Does the patient have difficulty concentrating, remembering, or making decisions?: No Patient able to express need for assistance with ADLs?: Yes Does the patient have difficulty dressing or bathing?: No Independently performs ADLs?: Yes (appropriate for developmental age) Does the patient have difficulty walking or climbing stairs?: No Weakness of Legs: None Weakness of Arms/Hands: None  Permission Sought/Granted Permission sought to share information with : Case Manager Permission granted to share information with : Yes, Verbal Permission Granted              Emotional Assessment Appearance:: Appears stated age Attitude/Demeanor/Rapport: Gracious Affect (typically observed): Accepting Orientation: : Oriented to Self, Oriented to Place, Oriented to  Time, Oriented to Situation Alcohol / Substance Use: Not Applicable Psych Involvement: No (comment)  Admission diagnosis:  S/P laparoscopic cholecystectomy [Z90.49] Patient Active Problem List   Diagnosis Date Noted   S/P laparoscopic cholecystectomy 07/29/2022   Depression 06/23/2022   Gallstones 06/23/2022   Pulmonary nodule, right 06/23/2022   H/O hiatal hernia 06/12/2022   Cellulitis of right breast 10/31/2021   Breast infection 09/23/2021  Genetic testing 04/10/2021   History of depression 03/27/2021   Easy bruising 03/27/2021   Bleeds easily ([place]) 03/27/2021   Malignant neoplasm of upper-outer quadrant of right breast in female, estrogen receptor positive ([place]) 03/26/2021   S/P repair of paraesophageal hernia 12/17/2020   Anxiety  11/02/2019   B12 deficiency 11/02/2019   Adjustment disorder with mixed anxiety and depressed mood 08/22/2019   Iron deficiency anemia 08/22/2019   Gastroesophageal reflux disease 08/22/2019   Chronic joint pain 08/22/2019   Stage 3 chronic kidney disease ([place]) 08/22/2019   Obesity (BMI 35.0-39.9 without comorbidity) 02/28/2019   Normocytic anemia, not due to blood loss 02/28/2019   Restless legs syndrome (RLS) 06/30/2018   Arthrosis of first carpometacarpal joint 02/11/2018   Primary osteoarthritis of first carpometacarpal joint of left hand    Pain in left hand 12/23/2017   Healthcare maintenance 11/16/2017   Tinea versicolor 11/16/2017   Chronic right shoulder pain 09/02/2017   Fibromyalgia 08/11/2017   Family history of diabetes mellitus 06/23/2016   Other fatigue 06/23/2016   Insomnia 06/23/2016   Rheumatoid arthritis ([place]) 06/23/2016   Status post right partial knee replacement 09/15/2014   Status post total knee replacement using cement 06/08/2014   Screening for colon cancer    Schatzki's ring    Hiatal hernia    Dysphagia 05/26/2014   Encounter for screening colonoscopy 05/26/2014   Adrenal adenoma 05/01/2014   Breast lump 05/01/2014   Metatarsalgia of both feet 01/12/2014   Bilateral leg pain 01/11/2014   Bilateral edema of lower extremity 01/11/2014   Other osteoarthritis of spine, thoracolumbar region 12/26/2013   Hypercholesteremia 05/19/2013   Periodic health assessment, general screening, adult 04/13/2013   GERD (gastroesophageal reflux disease) 04/13/2013   Essential hypertension 04/13/2013   Dyspnea 04/13/2013   PCP:  Lorrene Reid, PA-C (Inactive) Pharmacy:   [facility] [place] [address] Phone: [phone] Fax: [phone]  [facility] [address] [place] [address] Phone: [phone] Fax: [place], [place], [place] - [address] [place] [address] Phone: [phone] Fax: [place], [place] [place] [address] [place] [address] Phone: [phone] Fax: [phone]     Social Determinants of Health (SDOH) Social History: SDOH Screenings   Food Insecurity: No Food Insecurity (07/29/2022)  Housing: Low Risk  (07/29/2022)  Transportation Needs: No Transportation Needs (07/29/2022)  Utilities: Not At Risk (07/29/2022)  Depression (PHQ2-9): Medium Risk (06/23/2022)  Physical Activity: Sufficiently Active (07/16/2017)  Social Connections: Moderately Isolated (07/16/2017)  Stress: Stress Concern Present (07/16/2017)  Tobacco Use: Low Risk  (07/30/2022)   SDOH Interventions:     Readmission Risk Interventions     No data to display

## 2022-07-30 NOTE — Progress Notes (Signed)
1 Day Post-Op   Subjective/Chief Complaint: Dizzy when up last night, some nausea, ate full dinner, sore   Objective: Vital signs in last 24 hours: Temp:  [97.6 F (36.4 C)-98.2 F (36.8 C)] 97.9 F (36.6 C) (03/20 0725) Pulse Rate:  [59-78] 59 (03/20 0725) Resp:  [12-22] 18 (03/20 0725) BP: (98-139)/(54-122) 120/65 (03/20 0725) SpO2:  [92 %-99 %] 96 % (03/20 0725) Weight:  [99.9 kg] 99.9 kg (03/19 1207) Last BM Date : 07/28/22  Intake/Output from previous day: 03/19 0701 - 03/20 0700 In: 1699.5 [P.O.:240; I.V.:1459.5] Out: 20 [Blood:20] Intake/Output this shift: No intake/output data recorded.  Ab approp tender incisions clean Cv regular Pulm effort normal  Lab Results:  No results for input(s): "WBC", "HGB", "HCT", "PLT" in the last 72 hours. BMET No results for input(s): "NA", "K", "CL", "CO2", "GLUCOSE", "BUN", "CREATININE", "CALCIUM" in the last 72 hours. PT/INR No results for input(s): "LABPROT", "INR" in the last 72 hours. ABG No results for input(s): "PHART", "HCO3" in the last 72 hours.  Invalid input(s): "PCO2", "PO2"  Studies/Results: No results found.  Anti-infectives: Anti-infectives (From admission, onward)    Start     Dose/Rate Route Frequency Ordered Stop   07/29/22 1200  ceFAZolin (ANCEF) IVPB 2g/100 mL premix        2 g 200 mL/hr over 30 Minutes Intravenous On call to O.R. 07/29/22 1155 07/29/22 1436       Assessment/Plan: POD 1 lap chole -hopefully will feel better later -will see about dc after breakfast and if up and around    Rolm Bookbinder 07/30/2022

## 2022-07-31 DIAGNOSIS — K8012 Calculus of gallbladder with acute and chronic cholecystitis without obstruction: Secondary | ICD-10-CM | POA: Diagnosis not present

## 2022-07-31 LAB — SURGICAL PATHOLOGY

## 2022-07-31 MED ORDER — OXYCODONE HCL 5 MG PO TABS: 5.0000 mg | ORAL_TABLET | ORAL | 0 refills | Status: DC | PRN

## 2022-07-31 MED ORDER — ONDANSETRON 4 MG PO TBDP: 4.0000 mg | ORAL_TABLET | Freq: Four times a day (QID) | ORAL | 0 refills | Status: DC | PRN

## 2022-07-31 NOTE — Progress Notes (Signed)
Mobility Specialist - Progress Note   07/31/22 0950  Mobility  Activity Ambulated with assistance in hallway  Level of Assistance Standby assist, set-up cues, supervision of patient - no hands on  Assistive Device None  Distance Ambulated (ft) 200 ft  Activity Response Tolerated well  Mobility Referral Yes  $Mobility charge 1 Mobility   Pt was received in bed and agreeable to mobility. Pt express feeling much better than yesterday during walk. Pt c/o slight abdominal pain. Pt was returned to bed with all needs met.   Franki Monte  Mobility Specialist Please contact via Solicitor or Rehab office at [phone]

## 2022-07-31 NOTE — Discharge Summary (Signed)
Physician Discharge Summary  Patient ID: [name] MRN: [id number] DOB/AGE: 1960/12/14 62 y.o.  Admit date: 07/29/2022 Discharge date: 07/31/2022  Admission Diagnoses: Chronic cholecystitis  Discharge Diagnoses:  Principal Problem:   S/P laparoscopic cholecystectomy   Discharged Condition: good  Hospital Course: 62 yof s/p lap chole for chronic cholecystitis. Tol diet, was dizzy initially but better now. Ready for dc  Consults: None  Significant Diagnostic Studies: none  Treatments: surgery: lap chole  Discharge Exam: Blood pressure 94/80, pulse 61, temperature 97.8 F (36.6 C), temperature source Oral, resp. rate 17, height 5\' 3"  (1.6 m), weight 99.9 kg, last menstrual period 02/07/2013, SpO2 97 %. Ab soft approp tender incisions without infection  Disposition: Discharge disposition: 01-Home or Self Care        Allergies as of 07/31/2022       Reactions   Tramadol Itching   "bugs crawling all over"        Medication List     TAKE these medications    albuterol 108 (90 Base) MCG/ACT inhaler Commonly known as: VENTOLIN HFA Inhale 2 puffs into the lungs every 6 (six) hours as needed for wheezing or shortness of breath.   amLODipine 5 MG tablet Commonly known as: NORVASC Take 1 tablet (5 mg total) by mouth daily.   apixaban 5 MG Tabs tablet Commonly known as: Eliquis Take 1 tablet (5 mg total) by mouth 2 (two) times daily. Crush tablet before taking   cloNIDine 0.1 MG tablet Commonly known as: CATAPRES Take 1 tablet by mouth twice daily   dextromethorphan-guaiFENesin 30-600 MG 12hr tablet Commonly known as: MUCINEX DM Take 1 tablet by mouth 2 (two) times daily. Crush tablet before taking   FLUoxetine 20 MG tablet Commonly known as: PROZAC Take 2 tablets (40 mg total) by mouth daily. Crush tablets before taking.   hydrochlorothiazide 50 MG tablet Commonly known as: HYDRODIURIL Take 1 tablet (50 mg total) by mouth daily. Crush tablet before  taking   metoprolol succinate 25 MG 24 hr tablet Commonly known as: TOPROL-XL Take 1 tablet (25 mg total) by mouth daily. Split tablet in 1/2 prior to taking. What changed: additional instructions   ondansetron 4 MG disintegrating tablet Commonly known as: ZOFRAN-ODT Take 1 tablet (4 mg total) by mouth every 6 (six) hours as needed for nausea.   oxyCODONE 5 MG immediate release tablet Commonly known as: Oxy IR/ROXICODONE Take 1 tablet (5 mg total) by mouth every 4 (four) hours as needed for moderate pain.        Follow-up Information     Rolm Bookbinder, MD Follow up in 3 day(s).   Specialty: General Surgery Contact information: [address] [place] [address] [phone]                 Signed: Rolm Bookbinder 07/31/2022, 6:53 AM

## 2022-08-15 ENCOUNTER — Other Ambulatory Visit: Payer: Self-pay

## 2022-08-15 DIAGNOSIS — F32A Depression, unspecified: Secondary | ICD-10-CM

## 2022-08-15 MED ORDER — FLUOXETINE HCL 20 MG PO TABS: 40.0000 mg | ORAL_TABLET | Freq: Every day | ORAL | 0 refills | Status: DC

## 2022-08-25 ENCOUNTER — Other Ambulatory Visit: Payer: Self-pay

## 2022-08-25 DIAGNOSIS — I10 Essential (primary) hypertension: Secondary | ICD-10-CM

## 2022-08-25 MED ORDER — AMLODIPINE BESYLATE 5 MG PO TABS: 5.0000 mg | ORAL_TABLET | Freq: Every day | ORAL | 1 refills | Status: DC

## 2022-09-12 ENCOUNTER — Other Ambulatory Visit: Payer: Self-pay | Admitting: Physician Assistant

## 2022-09-12 DIAGNOSIS — I2699 Other pulmonary embolism without acute cor pulmonale: Secondary | ICD-10-CM

## 2022-09-19 ENCOUNTER — Other Ambulatory Visit: Payer: Self-pay | Admitting: Thoracic Surgery (Cardiothoracic Vascular Surgery)

## 2022-09-19 DIAGNOSIS — Z8719 Personal history of other diseases of the digestive system: Secondary | ICD-10-CM

## 2022-09-25 ENCOUNTER — Ambulatory Visit (HOSPITAL_COMMUNITY): Admission: RE | Admit: 2022-09-25 | Payer: Medicare Other | Source: Ambulatory Visit

## 2022-10-02 ENCOUNTER — Ambulatory Visit (HOSPITAL_COMMUNITY)
Admission: RE | Admit: 2022-10-02 | Discharge: 2022-10-02 | Disposition: A | Discharge status: Home / Self Care | Payer: Medicare Other | Source: Ambulatory Visit | Attending: Thoracic Surgery (Cardiothoracic Vascular Surgery) | Admitting: Thoracic Surgery (Cardiothoracic Vascular Surgery)

## 2022-10-02 DIAGNOSIS — Z8719 Personal history of other diseases of the digestive system: Secondary | ICD-10-CM | POA: Insufficient documentation

## 2022-10-02 DIAGNOSIS — Z9889 Other specified postprocedural states: Secondary | ICD-10-CM | POA: Insufficient documentation

## 2022-10-07 ENCOUNTER — Other Ambulatory Visit: Payer: Self-pay | Admitting: Physician Assistant

## 2022-10-07 DIAGNOSIS — I2699 Other pulmonary embolism without acute cor pulmonale: Secondary | ICD-10-CM

## 2022-10-10 ENCOUNTER — Ambulatory Visit: Payer: Medicare Other | Admitting: Thoracic Surgery (Cardiothoracic Vascular Surgery)

## 2022-10-13 ENCOUNTER — Telehealth: Payer: Self-pay | Admitting: *Deleted

## 2022-10-13 DIAGNOSIS — I2699 Other pulmonary embolism without acute cor pulmonale: Secondary | ICD-10-CM

## 2022-10-13 NOTE — Telephone Encounter (Signed)
Pt calling to request refill for below, she said that it was denied and I told her that it looks like the request is going to the wrong group. Please send refill.            apixaban (ELIQUIS) 5 MG TABS tablet    [address] [place], [place] - [address]    LOV 06/23/22 ROV 12/29/22

## 2022-10-14 MED ORDER — APIXABAN 5 MG PO TABS: 5.0000 mg | ORAL_TABLET | Freq: Two times a day (BID) | ORAL | 0 refills | Status: DC

## 2022-10-14 NOTE — Telephone Encounter (Signed)
It took me a while to track down the she is on this, but given that she has had recent surgery I think it is reasonable to keep her on the Eliquis for a few more months.  It has been filled by this office in the past, so I did provide a refill.  I do need to see her in the office so that we can discuss whether or not we are going to continue the Eliquis, so please make sure that she keeps an in person visit on 12/29/2022 even if the AWV portion is moved to the phone.  Thank you!

## 2022-10-24 ENCOUNTER — Encounter (HOSPITAL_BASED_OUTPATIENT_CLINIC_OR_DEPARTMENT_OTHER): Payer: Self-pay | Admitting: Student

## 2022-10-24 ENCOUNTER — Ambulatory Visit (INDEPENDENT_AMBULATORY_CARE_PROVIDER_SITE_OTHER): Payer: Medicare Other | Admitting: Student

## 2022-10-24 ENCOUNTER — Ambulatory Visit (HOSPITAL_BASED_OUTPATIENT_CLINIC_OR_DEPARTMENT_OTHER): Payer: Medicare Other

## 2022-10-24 DIAGNOSIS — S92512A Displaced fracture of proximal phalanx of left lesser toe(s), initial encounter for closed fracture: Secondary | ICD-10-CM

## 2022-10-24 DIAGNOSIS — M79672 Pain in left foot: Secondary | ICD-10-CM

## 2022-10-25 NOTE — Progress Notes (Signed)
Chief Complaint: Left foot pain     History of Present Illness:    [name] is a pleasant 62 y.o. female presenting today for evaluation of left foot pain after an injury 2 days ago.  She states that she hit the outside of her foot on the corner of a door, and then had it stepped on multiple times afterwards by her dog.  She does report swelling and bruising of the fifth toe.  This is very painful with weightbearing so she has been pronating her foot to keep weight off of it.  Pain is moderate in intensity and feels often like a burning and throbbing sensation.  She has tried taking Tylenol for pain relief.  She is a patient of Dr. Roda Shutters and has seen him for numerous orthopedic problems.   Surgical History:   Left 2nd toe hammertoe correction 2019  PMH/PSH/Family History/Social History/Meds/Allergies:    Past Medical History:  Diagnosis Date   Acid reflux    takes Zantac and Omeprazole daily   Anemia    Anxiety    takes Citaopram daily   Arthritis    right knee   Arthrosis    left thumb CMC   Breast cancer (HCC)    Chronic back pain    DDD   Clotting disorder (HCC)    hx of blood clot following knee scope, pt reports hx blood clot in her lung   Depression    Dyspnea on exertion    Fibromyalgia    History of bronchitis 3+yrs ago   Hyperlipidemia    takes Atorvastatin daily   Hypertension    takes Lisinopril and HCTZ daily   Insomnia    takes Elavil nightly as needed   Paraesophageal hernia    Personal history of radiation therapy    Past Surgical History:  Procedure Laterality Date   BREAST BIOPSY Left    BREAST LUMPECTOMY     BREAST LUMPECTOMY WITH RADIOACTIVE SEED AND SENTINEL LYMPH NODE BIOPSY Right 06/03/2021   Procedure: RIGHT BREAST LUMPECTOMY WITH RADIOACTIVE SEED AND SENTINEL LYMPH NODE BIOPSY;  Surgeon: Griselda Miner, MD;  Location: [facility];  Service: General;  Laterality: Right;   BUNIONECTOMY Right  02/18/2018   Procedure: Ivory Broad;  Surgeon: Felecia Shelling, DPM;  Location: [facility];  Service: Podiatry;  Laterality: Right;   CAPSULOTOMY Bilateral 02/18/2018   Procedure: CAPSULOTOMY MPJ RELEASE JOINT 2N BILATERAL;  Surgeon: Felecia Shelling, DPM;  Location: [facility];  Service: Podiatry;  Laterality: Bilateral;   CARPOMETACARPEL SUSPENSION PLASTY Left 01/27/2018   Procedure: LEFT THUMB ligament reconstruction and tendon interposition;  Surgeon: Tarry Kos, MD;  Location: [facility];  Service: Orthopedics;  Laterality: Left;   CARPOMETACARPEL SUSPENSION PLASTY Left 03/07/2020   Procedure: REVISION LEFT THUMB CARPOMETACARPAL (CMC) ARTHROPLASTY;  Surgeon: Tarry Kos, MD;  Location: [facility];  Service: Orthopedics;  Laterality: Left;   CHOLECYSTECTOMY N/A 07/29/2022   Procedure: LAPAROSCOPIC CHOLECYSTECTOMY WITH  ICG DYE;  Surgeon: Emelia Loron, MD;  Location: [facility];  Service: General;  Laterality: N/A;  RNFA  ICG DYE  TAP BLOCK   CHONDROPLASTY Right 06/28/2014   Procedure: CHONDROPLASTY;  Surgeon: Cheral Almas, MD;  Location: [facility];  Service: Orthopedics;  Laterality: Right;   COLONOSCOPY  N/A 05/29/2014   Procedure: COLONOSCOPY;  Surgeon: Corbin Ade, MD;  [facility];  Service: Endoscopy;  Laterality: N/A;  215pm- Pt is working until 12:00 so she can't come any earlier   ESOPHAGOGASTRODUODENOSCOPY N/A 05/29/2014   Procedure: ESOPHAGOGASTRODUODENOSCOPY (EGD);  Surgeon: Corbin Ade, MD;  [facility];  Service: Endoscopy;  Laterality: N/A;   ESOPHAGOGASTRODUODENOSCOPY N/A 12/17/2020   Procedure: ESOPHAGOGASTRODUODENOSCOPY (EGD);  Surgeon: Corliss Skains, MD;  [facility];  Service: Thoracic;  Laterality: N/A;   ESOPHAGOGASTRODUODENOSCOPY N/A 06/12/2022   Procedure: ESOPHAGOGASTRODUODENOSCOPY (EGD);  Surgeon: Corliss Skains, MD;  Location[facility];  Service: Thoracic;  Laterality:  N/A;   GANGLION CYST EXCISION Left 01/13/2002   HAMMER TOE SURGERY Bilateral 02/18/2018   Procedure: HAMMER TOE CORRECTION2ND BILATERAL;  Surgeon: Felecia Shelling, DPM;  Location: [facility];  Service: Podiatry;  Laterality: Bilateral;   HERNIA REPAIR     IRRIGATION AND DEBRIDEMENT ABSCESS Right 09/08/2021   Procedure: IRRIGATION AND DEBRIDEMENT RIGHT BREAST ABSCESS;  Surgeon: Manus Rudd, MD;  Location: [facility];  Service: General;  Laterality: Right;   KNEE ARTHROSCOPY WITH MEDIAL MENISECTOMY Right 06/28/2014   Procedure: RIGHT KNEE ARTHROSCOPY WITH PARTIAL MEDIAL MENISCECTOMY AND CHONDROPLASTY;  Surgeon: Cheral Almas, MD;  Location: [facility];  Service: Orthopedics;  Laterality: Right;   MALONEY DILATION N/A 05/29/2014   Procedure: [name] DILATION;  Surgeon: Corbin Ade, MD;  [facility];  Service: Endoscopy;  Laterality: N/A;   PARTIAL KNEE ARTHROPLASTY Right 09/15/2014   Procedure: RIGHT UNICOMPARTMENTAL KNEE ARTHROPLASTY;  Surgeon: Tarry Kos, MD;  Location[facility];  Service: Orthopedics;  Laterality: Right;   SHOULDER ARTHROSCOPY Right    TOTAL KNEE ARTHROPLASTY Left 04/08/2004   XI ROBOTIC ASSISTED HIATAL HERNIA REPAIR N/A 06/12/2022   Procedure: XI ROBOTIC ASSISTED HIATAL HERNIA REPAIR;  Surgeon: Corliss Skains, MD;  Location: [facility];  Service: Thoracic;  Laterality: N/A;   XI ROBOTIC ASSISTED PARAESOPHAGEAL HERNIA REPAIR N/A 12/17/2020   Procedure: XI ROBOTIC ASSISTED Laparoscopy PARAESOPHAGEAL HERNIA REPAIR WITH FUNDOPLICATION;  Surgeon: Corliss Skains, MD;  Location: [facility];  Service: Thoracic;  Laterality: N/A;   Social History   Socioeconomic History   Marital status: Divorced    Spouse name: Not on file   Number of children: 5   Years of education: Not on file   Highest education level: Not on file  Occupational History   Occupation: Conservation officer, nature  Tobacco Use   Smoking status: Never   Smokeless tobacco: Never  Vaping Use   Vaping  Use: Never used  Substance and Sexual Activity   Alcohol use: No    Alcohol/week: 0.0 standard drinks of alcohol   Drug use: No   Sexual activity: Not Currently    Birth control/protection: None, Post-menopausal  Other Topics Concern   Not on file  Social History Narrative   Right Handed    Lives in a one story home    Social Determinants of Health   Financial Resource Strain: Not on file  Food Insecurity: No Food Insecurity (07/29/2022)   Hunger Vital Sign    Worried About Running Out of Food in the Last Year: Never true    Ran Out of Food in the Last Year: Never true  Transportation Needs: No Transportation Needs (07/29/2022)   PRAPARE - Administrator, Civil Service (Medical): No    Lack of Transportation (Non-Medical): No  Physical Activity: Sufficiently Active (07/16/2017)  Exercise Vital Sign    Days of Exercise per Week: 7 days    Minutes of Exercise per Session: 60 min  Stress: Stress Concern Present (07/16/2017)   [employer] of Occupational Health - Occupational Stress Questionnaire    Feeling of Stress : Rather much  Social Connections: Moderately Isolated (07/16/2017)   Social Connection and Isolation Panel [NHANES]    Frequency of Communication with Friends and Family: More than three times a week    Frequency of Social Gatherings with Friends and Family: Once a week    Attends Religious Services: Never    Database administrator or Organizations: No    Attends Engineer, structural: Never    Marital Status: Divorced   Family History  Problem Relation Age of Onset   Stroke Mother    Lung cancer Father 61   GI problems Father    Prostate cancer Father    Cancer Sister        breast   Breast cancer Sister 46   Lung cancer Maternal Grandfather    Lung cancer Paternal Grandfather    Diabetes Son    Colon cancer Neg Hx    Esophageal cancer Neg Hx    Pancreatic cancer Neg Hx    Allergies  Allergen Reactions   Tramadol Itching    "bugs  crawling all over"   Current Outpatient Medications  Medication Sig Dispense Refill   albuterol (VENTOLIN HFA) 108 (90 Base) MCG/ACT inhaler Inhale 2 puffs into the lungs every 6 (six) hours as needed for wheezing or shortness of breath. 8 g 2   amLODipine (NORVASC) 5 MG tablet Take 1 tablet (5 mg total) by mouth daily. 90 tablet 1   apixaban (ELIQUIS) 5 MG TABS tablet Take 1 tablet (5 mg total) by mouth 2 (two) times daily. Crush tablet before taking 60 tablet 0   cloNIDine (CATAPRES) 0.1 MG tablet Take 1 tablet by mouth twice daily 180 tablet 1   dextromethorphan-guaiFENesin (MUCINEX DM) 30-600 MG 12hr tablet Take 1 tablet by mouth 2 (two) times daily. Crush tablet before taking (Patient not taking: Reported on 07/23/2022) 30 tablet 0   FLUoxetine (PROZAC) 20 MG tablet Take 2 tablets (40 mg total) by mouth daily. Crush tablets before taking. 180 tablet 0   hydrochlorothiazide (HYDRODIURIL) 50 MG tablet Take 1 tablet (50 mg total) by mouth daily. Crush tablet before taking 90 tablet 0   metoprolol succinate (TOPROL-XL) 25 MG 24 hr tablet Take 1 tablet (25 mg total) by mouth daily. Split tablet in 1/2 prior to taking. (Patient taking differently: Take 25 mg by mouth daily.) 90 tablet 0   ondansetron (ZOFRAN-ODT) 4 MG disintegrating tablet Take 1 tablet (4 mg total) by mouth every 6 (six) hours as needed for nausea. 20 tablet 0   oxyCODONE (OXY IR/ROXICODONE) 5 MG immediate release tablet Take 1 tablet (5 mg total) by mouth every 4 (four) hours as needed for moderate pain. 12 tablet 0   No current facility-administered medications for this visit.   No results found.  Review of Systems:   A ROS was performed including pertinent positives and negatives as documented in the HPI.  Physical Exam :   Constitutional: NAD and appears stated age Neurological: Alert and oriented Psych: Appropriate affect and cooperative Last menstrual period 02/07/2013.   Comprehensive Musculoskeletal Exam:     Tenderness over fifth PIP and DIP joints into distal fifth metatarsal.  No obvious deformity of toes.  The fifth toe does  appear bruised and swollen with swelling extending into the foot.  Active range of motion of the ankle from 10 degrees dorsiflexion to 30 degrees plantarflexion.  DP pulse 2+.  Toes warm and well-perfused.  Neurosensory exam intact.  Imaging:   Xray (Left foot 3 views): Oblique fracture of 5th proximal phalanx with mild lateral displacement   I personally reviewed and interpreted the radiographs.   Assessment:   62 y.o. female with left fifth proximal phalanx fracture of the foot.  Overall there is good alignment with only mild displacement so I do believe that will heal well with conservative management.  Due to her difficulty with weightbearing I did offer use of a postop shoe or short walking boot and patient would like to proceed with the boot.  Patient is on Eliquis 10 mg daily so I recommend continuing with Tylenol for pain control.  Can also ice the area when out of the boot.  Progress weightbearing as tolerated and I will plan on seeing her back in 4 weeks for reassessment and to ensure proper healing.  Plan :    -Return to clinic in 4 weeks for reassessment     I personally saw and evaluated the patient, and participated in the management and treatment plan.  Hazle Nordmann, PA-C Orthopedics  This document was dictated using Conservation officer, historic buildings. A reasonable attempt at proof reading has been made to minimize errors.

## 2022-10-31 ENCOUNTER — Other Ambulatory Visit: Payer: Self-pay | Admitting: *Deleted

## 2022-10-31 ENCOUNTER — Ambulatory Visit (INDEPENDENT_AMBULATORY_CARE_PROVIDER_SITE_OTHER): Payer: Medicare Other | Admitting: Thoracic Surgery (Cardiothoracic Vascular Surgery)

## 2022-10-31 ENCOUNTER — Ambulatory Visit: Payer: Medicare Other | Admitting: Thoracic Surgery (Cardiothoracic Vascular Surgery)

## 2022-10-31 ENCOUNTER — Encounter: Payer: Self-pay | Admitting: *Deleted

## 2022-10-31 VITALS — BP 112/76 | HR 80 | Resp 18 | Ht 63.0 in | Wt 218.0 lb

## 2022-10-31 DIAGNOSIS — Z8719 Personal history of other diseases of the digestive system: Secondary | ICD-10-CM

## 2022-10-31 DIAGNOSIS — Z9889 Other specified postprocedural states: Secondary | ICD-10-CM

## 2022-10-31 DIAGNOSIS — R1310 Dysphagia, unspecified: Secondary | ICD-10-CM

## 2022-10-31 NOTE — Progress Notes (Signed)
     [address]       [name] [address]             [phone]       62 year old female presents today in follow-up for further discussion of her dysphagia.  She describes dysphagia mostly in the cervical phase which is associated mostly with rice and pasta.  She denies any reflux.  Personally reviewed her most recent swallow study.  Contrast as well as barium pill passed to the GE junction without much delay.  She does have a prominent cricopharyngeus muscle.  I gave her the option of performing the dilation, but I do not think that this will significantly improve her symptoms.  She is willing to give this a try.  She is scheduled for an EGD and savory dilation.

## 2022-10-31 NOTE — H&P (View-Only) (Signed)
     [address]       [place] [address]             [phone]       61-year-old female presents today in follow-up for further discussion of her dysphagia.  She describes dysphagia mostly in the cervical phase which is associated mostly with rice and pasta.  She denies any reflux.  Personally reviewed her most recent swallow study.  Contrast as well as barium pill passed to the GE junction without much delay.  She does have a prominent cricopharyngeus muscle.  I gave her the option of performing the dilation, but I do not think that this will significantly improve her symptoms.  She is willing to give this a try.  She is scheduled for an EGD and savory dilation.  

## 2022-11-04 ENCOUNTER — Other Ambulatory Visit: Payer: Self-pay | Admitting: Physician Assistant

## 2022-11-04 DIAGNOSIS — I10 Essential (primary) hypertension: Secondary | ICD-10-CM

## 2022-11-05 ENCOUNTER — Telehealth: Payer: Self-pay | Admitting: *Deleted

## 2022-11-05 ENCOUNTER — Other Ambulatory Visit: Payer: Self-pay

## 2022-11-05 DIAGNOSIS — I10 Essential (primary) hypertension: Secondary | ICD-10-CM

## 2022-11-05 NOTE — Telephone Encounter (Signed)
Pt calling to request refill for below, she said that it was denied and I told her that it looks like the request is going to the wrong group. Please send refill.               hydrochlorothiazide (HYDRODIURIL) 50 MG tablet        [address] [place], [place] - [address]      LOV 06/23/22 ROV 12/29/22

## 2022-11-06 ENCOUNTER — Other Ambulatory Visit (HOSPITAL_COMMUNITY): Payer: Medicare Other

## 2022-11-06 MED ORDER — HYDROCHLOROTHIAZIDE 50 MG PO TABS: 50.0000 mg | ORAL_TABLET | Freq: Every day | ORAL | 1 refills | Status: DC

## 2022-11-06 NOTE — Telephone Encounter (Signed)
Meds ordered this encounter  Medications   hydrochlorothiazide (HYDRODIURIL) 50 MG tablet    Sig: Take 1 tablet (50 mg total) by mouth daily. Crush tablet before taking    Dispense:  90 tablet    Refill:  1    Order Specific Question:   Supervising Provider    Answer:   Nani Gasser D [[id number]]

## 2022-11-11 ENCOUNTER — Telehealth: Payer: Self-pay | Admitting: *Deleted

## 2022-11-11 DIAGNOSIS — I10 Essential (primary) hypertension: Secondary | ICD-10-CM

## 2022-11-11 MED ORDER — ALBUTEROL SULFATE HFA 108 (90 BASE) MCG/ACT IN AERS: 2.0000 | INHALATION_SPRAY | Freq: Four times a day (QID) | RESPIRATORY_TRACT | 2 refills | Status: DC | PRN

## 2022-11-11 MED ORDER — METOPROLOL SUCCINATE ER 25 MG PO TB24
25.0000 mg | ORAL_TABLET | Freq: Every day | ORAL | 0 refills | Status: DC
Start: 2022-11-11 — End: 2022-12-29

## 2022-11-11 NOTE — Telephone Encounter (Signed)
Pt calling to request refill for below, she said that it was denied and I told her that it looks like the request is going to the wrong group. Please send refill.                albuterol (VENTOLIN HFA) 108 (90 Base) MCG/ACT inhaler      metoprolol succinate (TOPROL-XL) 25 MG 24 hr tablet      [facility] [address] [place], [place] - [address]      LOV 06/23/22 ROV 12/29/22

## 2022-11-11 NOTE — Telephone Encounter (Signed)
Refill sent.

## 2022-11-12 ENCOUNTER — Telehealth: Payer: Self-pay | Admitting: *Deleted

## 2022-11-12 NOTE — Telephone Encounter (Signed)
LVM to call office to see about changing appt to office visit and rescheduling AWV to the phone team.

## 2022-11-12 NOTE — Progress Notes (Signed)
Surgical Instructions    Your procedure is scheduled on Wednesday November 19, 2022.  Report to [facility] Main Entrance "A" at 12:30  PM  then check in with the Admitting office.  Call this number if you have problems the morning of surgery:  [phone]   If you have any questions prior to your surgery date call [phone]: Open Monday-Friday 8am-4pm If you experience any cold or flu symptoms such as cough, fever, chills, shortness of breath, etc. between now and your scheduled surgery, please notify us at the above number    Remember:  Do not eat or drink after midnight the night before your surgery    Take these medicines the morning of surgery with A SIP OF WATER:  amLODipine (NORVASC)  cloNIDine (CATAPRES)  FLUoxetine (PROZAC)  metoprolol succinate (TOPROL-XL)    As needed: albuterol (VENTOLIN HFA)  ondansetron (ZOFRAN-ODT)  oxyCODONE (OXY IR/ROXICODONE)    Please STOP taking your apixaban (ELIQUIS) three days prior to surgery, with the last dose being 11/15/2022.    As of today, STOP taking any Aspirin (unless otherwise instructed by your surgeon) Aleve, Naproxen, Ibuprofen, Motrin, Advil, Goody's, BC's, all herbal medications, fish oil, and all vitamins.    Do NOT Smoke (Tobacco/Vaping)  24 hours prior to your procedure  If you use a CPAP at night, you may bring your mask for your overnight stay.   Contacts, glasses, hearing aids, dentures or partials may not be worn into surgery, please bring cases for these belongings   For patients admitted to the hospital, discharge time will be determined by your treatment team.   Patients discharged the day of surgery will not be allowed to drive home, and someone needs to stay with them for 24 hours.   SURGICAL WAITING ROOM VISITATION Patients having surgery or a procedure may have no more than 2 support people in the waiting area - these visitors may rotate.   Children under the age of 20 must have an adult with them who is  not the patient. If the patient needs to stay at the hospital during part of their recovery, the visitor guidelines for inpatient rooms apply. Pre-op nurse will coordinate an appropriate time for 1 support person to accompany patient in pre-op.  This support person may not rotate.   Please refer to [email or web address] for the visitor guidelines for Inpatients (after your surgery is over and you are in a regular room).    Special instructions:    Oral Hygiene is also important to reduce your risk of infection.  Remember - BRUSH YOUR TEETH THE MORNING OF SURGERY WITH YOUR REGULAR TOOTHPASTE   [facility]- Preparing For Surgery  Before surgery, you can play an important role. Because skin is not sterile, your skin needs to be as free of germs as possible. You can reduce the number of germs on your skin by washing with CHG (chlorahexidine gluconate) Soap before surgery.  CHG is an antiseptic cleaner which kills germs and bonds with the skin to continue killing germs even after washing.     Please do not use if you have an allergy to CHG or antibacterial soaps. If your skin becomes reddened/irritated stop using the CHG.  Do not shave (including legs and underarms) for at least 48 hours prior to first CHG shower. It is OK to shave your face.  Please follow these instructions carefully.     Shower the NIGHT BEFORE SURGERY and the MORNING OF SURGERY with CHG Soap.   If  you chose to wash your hair, wash your hair first as usual with your normal shampoo. After you shampoo, rinse your hair and body thoroughly to remove the shampoo.  Then [employer] and genitals (private parts) with your normal soap and rinse thoroughly to remove soap.  After that Use CHG Soap as you would any other liquid soap. You can apply CHG directly to the skin and wash gently with a scrungie or a clean washcloth.   Apply the CHG Soap to your body ONLY FROM THE NECK DOWN.  Do not  use on open wounds or open sores. Avoid contact with your eyes, ears, mouth and genitals (private parts). Wash Face and genitals (private parts)  with your normal soap.   Wash thoroughly, paying special attention to the area where your surgery will be performed.  Thoroughly rinse your body with warm water from the neck down.  DO NOT shower/wash with your normal soap after using and rinsing off the CHG Soap.  Pat yourself dry with a CLEAN TOWEL.  Wear CLEAN PAJAMAS to bed the night before surgery  Place CLEAN SHEETS on your bed the night before your surgery  DO NOT SLEEP WITH PETS.   Day of Surgery:  Take a shower with CHG soap. Do not wear jewelry or makeup. Do not wear lotions, powders, perfumes or deodorant. Do not shave 48 hours prior to surgery.  Do not bring valuables to the hospital. Do not wear nail polish, gel polish, artificial nails, or any other type of covering on natural nails (fingers and toes) If you have artificial nails or gel coating that need to be removed by a nail salon, please have this removed prior to surgery. Artificial nails or gel coating may interfere with anesthesia's ability to adequately monitor your vital signs.  [facility] is not responsible for any belongings or valuables.   Wear Clean/Comfortable clothing the morning of surgery  Remember to brush your teeth WITH YOUR REGULAR TOOTHPASTE.    If you received a COVID test during your pre-op visit, it is requested that you wear a mask when out in public, stay away from anyone that may not be feeling well, and notify your surgeon if you develop symptoms. If you have been in contact with anyone that has tested positive in the last 10 days, please notify your surgeon.    Please read over the following fact sheets that you were given.

## 2022-11-14 ENCOUNTER — Ambulatory Visit (INDEPENDENT_AMBULATORY_CARE_PROVIDER_SITE_OTHER): Payer: Medicare Other

## 2022-11-14 ENCOUNTER — Ambulatory Visit (INDEPENDENT_AMBULATORY_CARE_PROVIDER_SITE_OTHER): Payer: Medicare Other | Admitting: Student

## 2022-11-14 DIAGNOSIS — S92512A Displaced fracture of proximal phalanx of left lesser toe(s), initial encounter for closed fracture: Secondary | ICD-10-CM

## 2022-11-14 DIAGNOSIS — S92512D Displaced fracture of proximal phalanx of left lesser toe(s), subsequent encounter for fracture with routine healing: Secondary | ICD-10-CM

## 2022-11-14 NOTE — Progress Notes (Signed)
Chief Complaint: Left foot pain     History of Present Illness:   11/14/22: Patient presents for 3 week follow for left fifth proximal phalanx fracture.  She reports that her pain has improved she does still have some soreness.  She has begun transitioning back into a normal shoe but is still utilizing the boot when out for long periods of time.  Pain levels are overall mild.  She is taking Tylenol as needed.  Has reported bumping her foot into the corner of a door twice more since the original injury.    10/24/22: DEAUNDREA TIENKEN is a pleasant 62 y.o. female presenting today for evaluation of left foot pain after an injury 2 days ago.  She states that she hit the outside of her foot on the corner of a door, and then had it stepped on multiple times afterwards by her dog.  She does report swelling and bruising of the fifth toe.  This is very painful with weightbearing so she has been pronating her foot to keep weight off of it.  Pain is moderate in intensity and feels often like a burning and throbbing sensation.  She has tried taking Tylenol for pain relief.  She is a patient of Dr. Roda Shutters and has seen him for numerous orthopedic problems.   Surgical History:   Left 2nd toe hammertoe correction 2019  PMH/PSH/Family History/Social History/Meds/Allergies:    Past Medical History:  Diagnosis Date   Acid reflux    takes Zantac and Omeprazole daily   Anemia    Anxiety    takes Citaopram daily   Arthritis    right knee   Arthrosis    left thumb CMC   Breast cancer (HCC)    Chronic back pain    DDD   Clotting disorder (HCC)    hx of blood clot following knee scope, pt reports hx blood clot in her lung   Depression    Dyspnea on exertion    Fibromyalgia    History of bronchitis 3+yrs ago   Hyperlipidemia    takes Atorvastatin daily   Hypertension    takes Lisinopril and HCTZ daily   Insomnia    takes Elavil nightly as needed   Paraesophageal hernia     Personal history of radiation therapy    Past Surgical History:  Procedure Laterality Date   BREAST BIOPSY Left    BREAST LUMPECTOMY     BREAST LUMPECTOMY WITH RADIOACTIVE SEED AND SENTINEL LYMPH NODE BIOPSY Right 06/03/2021   Procedure: RIGHT BREAST LUMPECTOMY WITH RADIOACTIVE SEED AND SENTINEL LYMPH NODE BIOPSY;  Surgeon: Griselda Miner, MD;  Location: [facility];  Service: General;  Laterality: Right;   BUNIONECTOMY Right 02/18/2018   Procedure: Ivory Broad;  Surgeon: Felecia Shelling, DPM;  Location: [facility];  Service: Podiatry;  Laterality: Right;   CAPSULOTOMY Bilateral 02/18/2018   Procedure: CAPSULOTOMY MPJ RELEASE JOINT 2N BILATERAL;  Surgeon: Felecia Shelling, DPM;  Location: [facility];  Service: Podiatry;  Laterality: Bilateral;   CARPOMETACARPEL SUSPENSION PLASTY Left 01/27/2018   Procedure: LEFT THUMB ligament reconstruction and tendon interposition;  Surgeon: Tarry Kos, MD;  Location: [facility];  Service: Orthopedics;  Laterality: Left;   CARPOMETACARPEL SUSPENSION PLASTY Left 03/07/2020   Procedure: REVISION LEFT THUMB CARPOMETACARPAL (CMC) ARTHROPLASTY;  Surgeon: Tarry Kos, MD;  Location: [facility];  Service: Orthopedics;  Laterality: Left;   CHOLECYSTECTOMY N/A 07/29/2022   Procedure: LAPAROSCOPIC CHOLECYSTECTOMY WITH  ICG DYE;  Surgeon: Emelia Loron, MD;  Location: [facility] OR;  Service: General;  Laterality: N/A;  RNFA  ICG DYE  TAP BLOCK   CHONDROPLASTY Right 06/28/2014   Procedure: CHONDROPLASTY;  Surgeon: Cheral Almas, MD;  Location: [facility];  Service: Orthopedics;  Laterality: Right;   COLONOSCOPY N/A 05/29/2014   Procedure: COLONOSCOPY;  Surgeon: Corbin Ade, MD;  [facility];  Service: Endoscopy;  Laterality: N/A;  215pm- Pt is working until 12:00 so she can't come any earlier   ESOPHAGOGASTRODUODENOSCOPY N/A 05/29/2014   Procedure: ESOPHAGOGASTRODUODENOSCOPY (EGD);   Surgeon: Corbin Ade, MD;  [facility];  Service: Endoscopy;  Laterality: N/A;   ESOPHAGOGASTRODUODENOSCOPY N/A 12/17/2020   Procedure: ESOPHAGOGASTRODUODENOSCOPY (EGD);  Surgeon: Corliss Skains, MD;  Location: [facility] OR;  Service: Thoracic;  Laterality: N/A;   ESOPHAGOGASTRODUODENOSCOPY N/A 06/12/2022   Procedure: ESOPHAGOGASTRODUODENOSCOPY (EGD);  Surgeon: Corliss Skains, MD;  Location: [facility];  Service: Thoracic;  Laterality: N/A;   GANGLION CYST EXCISION Left 01/13/2002   HAMMER TOE SURGERY Bilateral 02/18/2018   Procedure: HAMMER TOE CORRECTION2ND BILATERAL;  Surgeon: Felecia Shelling, DPM;  Location: [facility];  Service: Podiatry;  Laterality: Bilateral;   HERNIA REPAIR     IRRIGATION AND DEBRIDEMENT ABSCESS Right 09/08/2021   Procedure: IRRIGATION AND DEBRIDEMENT RIGHT BREAST ABSCESS;  Surgeon: Manus Rudd, MD;  Location: [facility];  Service: General;  Laterality: Right;   KNEE ARTHROSCOPY WITH MEDIAL MENISECTOMY Right 06/28/2014   Procedure: RIGHT KNEE ARTHROSCOPY WITH PARTIAL MEDIAL MENISCECTOMY AND CHONDROPLASTY;  Surgeon: Cheral Almas, MD;  Location:  SUR[facility]ERY [facility];  Service: Orthopedics;  Laterality: Right;   MALONEY DILATION N/A 05/29/2014   Procedure: [name] DILATION;  Surgeon: Corbin Ade, MD;  [facility];  Service: Endoscopy;  Laterality: N/A;   PARTIAL KNEE ARTHROPLASTY Right 09/15/2014   Procedure: RIGHT UNICOMPARTMENTAL KNEE ARTHROPLASTY;  Surgeon: Tarry Kos, MD;  [facility];  Service: Orthopedics;  Laterality: Right;   SHOULDER ARTHROSCOPY Right    TOTAL KNEE ARTHROPLASTY Left 04/08/2004   XI ROBOTIC ASSISTED HIATAL HERNIA REPAIR N/A 06/12/2022   Procedure: XI ROBOTIC ASSISTED HIATAL HERNIA REPAIR;  Surgeon: Corliss Skains, MD;  Location: [facility];  Service: Thoracic;  Laterality: N/A;   XI ROBOTIC ASSISTED PARAESOPHAGEAL HERNIA REPAIR N/A 12/17/2020   Procedure: XI ROBOTIC ASSISTED Laparoscopy PARAESOPHAGEAL HERNIA  REPAIR WITH FUNDOPLICATION;  Surgeon: Corliss Skains, MD;  Location: [facility];  Service: Thoracic;  Laterality: N/A;   Social History   Socioeconomic History   Marital status: Divorced    Spouse name: Not on file   Number of children: 5   Years of education: Not on file   Highest education level: Not on file  Occupational History   Occupation: Conservation officer, nature  Tobacco Use   Smoking status: Never   Smokeless tobacco: Never  Vaping Use   Vaping Use: Never used  Substance and Sexual Activity   Alcohol use: No    Alcohol/week: 0.0 standard drinks of alcohol   Drug use: No   Sexual activity: Not Currently    Birth control/protection: None, Post-menopausal  Other Topics Concern   Not on file  Social History Narrative   Right Handed    Lives in a one story home    Social Determinants of  Health   Financial Resource Strain: Not on file  Food Insecurity: No Food Insecurity (07/29/2022)   Hunger Vital Sign    Worried About Running Out of Food in the Last Year: Never true    Ran Out of Food in the Last Year: Never true  Transportation Needs: No Transportation Needs (07/29/2022)   PRAPARE - Administrator, Civil Service (Medical): No    Lack of Transportation (Non-Medical): No  Physical Activity: Sufficiently Active (07/16/2017)   Exercise Vital Sign    Days of Exercise per Week: 7 days    Minutes of Exercise per Session: 60 min  Stress: Stress Concern Present (07/16/2017)   [employer] of Occupational Health - Occupational Stress Questionnaire    Feeling of Stress : Rather much  Social Connections: Moderately Isolated (07/16/2017)   Social Connection and Isolation Panel [NHANES]    Frequency of Communication with Friends and Family: More than three times a week    Frequency of Social Gatherings with Friends and Family: Once a week    Attends Religious Services: Never    Database administrator or Organizations: No    Attends Engineer, structural: Never    Marital  Status: Divorced   Family History  Problem Relation Age of Onset   Stroke Mother    Lung cancer Father 50   GI problems Father    Prostate cancer Father    Cancer Sister        breast   Breast cancer Sister 30   Lung cancer Maternal Grandfather    Lung cancer Paternal Grandfather    Diabetes Son    Colon cancer Neg Hx    Esophageal cancer Neg Hx    Pancreatic cancer Neg Hx    Allergies  Allergen Reactions   Tramadol Itching    "bugs crawling all over"   Current Outpatient Medications  Medication Sig Dispense Refill   albuterol (VENTOLIN HFA) 108 (90 Base) MCG/ACT inhaler Inhale 2 puffs into the lungs every 6 (six) hours as needed for wheezing or shortness of breath. 8 g 2   amLODipine (NORVASC) 5 MG tablet Take 1 tablet (5 mg total) by mouth daily. 90 tablet 1   apixaban (ELIQUIS) 5 MG TABS tablet Take 1 tablet (5 mg total) by mouth 2 (two) times daily. Crush tablet before taking 60 tablet 0   cloNIDine (CATAPRES) 0.1 MG tablet Take 1 tablet by mouth twice daily 180 tablet 1   dextromethorphan-guaiFENesin (MUCINEX DM) 30-600 MG 12hr tablet Take 1 tablet by mouth 2 (two) times daily. Crush tablet before taking (Patient not taking: Reported on 11/12/2022) 30 tablet 0   FLUoxetine (PROZAC) 20 MG tablet Take 2 tablets (40 mg total) by mouth daily. Crush tablets before taking. 180 tablet 0   hydrochlorothiazide (HYDRODIURIL) 50 MG tablet Take 1 tablet (50 mg total) by mouth daily. Crush tablet before taking 90 tablet 1   metoprolol succinate (TOPROL-XL) 25 MG 24 hr tablet Take 1 tablet (25 mg total) by mouth daily. Split tablet in 1/2 prior to taking. 90 tablet 0   ondansetron (ZOFRAN-ODT) 4 MG disintegrating tablet Take 1 tablet (4 mg total) by mouth every 6 (six) hours as needed for nausea. (Patient not taking: Reported on 11/12/2022) 20 tablet 0   oxyCODONE (OXY IR/ROXICODONE) 5 MG immediate release tablet Take 1 tablet (5 mg total) by mouth every 4 (four) hours as needed for moderate  pain. (Patient not taking: Reported on 11/12/2022) 12 tablet 0  No current facility-administered medications for this visit.   No results found.  Review of Systems:   A ROS was performed including pertinent positives and negatives as documented in the HPI.  Physical Exam :   Constitutional: NAD and appears stated age Neurological: Alert and oriented Psych: Appropriate affect and cooperative Last menstrual period 02/07/2013.   Comprehensive Musculoskeletal Exam:    No obvious deformity of the left fifth toe.  Tenderness to palpation over the PIP joint and distal to the metatarsal.  No significant swelling or ecchymosis.  Flexor and extensor mechanisms of the fifth toe are intact.  Normal ankle range of motion without pain.  DP pulse 2+.  Neurosensory exam intact.   Imaging:   Xray (Left fifth toe 3 views): Mildly displaced oblique 5th proximal phalanx fracture with early callus formation   I personally reviewed and interpreted the radiographs.   Assessment:   62 y.o. female 3 weeks status post left fifth proximal phalanx fracture.  X-ray does show early signs of callus formation and I believe this will continue to heal well.  Discussed that she can continue weaning out of boot as tolerated, however may continue to need the boot for long periods of activity.  Continue icing and taking Tylenol as needed.  I will plan on seeing her back 1 more time in about 8 weeks for reevaluation and to assess healing with x-ray.  Can return to clinic sooner as needed.   Plan :    -Return to clinic in 8 weeks for reassessment     I personally saw and evaluated the patient, and participated in the management and treatment plan.  Hazle Nordmann, PA-C Orthopedics  This document was dictated using Conservation officer, historic buildings. A reasonable attempt at proof reading has been made to minimize errors.

## 2022-11-16 ENCOUNTER — Other Ambulatory Visit: Payer: Self-pay | Admitting: Family Medicine

## 2022-11-16 DIAGNOSIS — I2699 Other pulmonary embolism without acute cor pulmonale: Secondary | ICD-10-CM

## 2022-11-17 ENCOUNTER — Ambulatory Visit (HOSPITAL_COMMUNITY): Admission: RE | Admit: 2022-11-17 | Payer: Medicare Other | Source: Ambulatory Visit

## 2022-11-17 ENCOUNTER — Encounter (HOSPITAL_COMMUNITY): Payer: Self-pay

## 2022-11-17 ENCOUNTER — Other Ambulatory Visit: Payer: Self-pay

## 2022-11-17 ENCOUNTER — Encounter (HOSPITAL_COMMUNITY)
Admission: RE | Admit: 2022-11-17 | Discharge: 2022-11-17 | Disposition: A | Discharge status: Home / Self Care | Payer: Medicare Other | Source: Ambulatory Visit | Attending: Thoracic Surgery (Cardiothoracic Vascular Surgery) | Admitting: Thoracic Surgery (Cardiothoracic Vascular Surgery)

## 2022-11-17 VITALS — BP 119/50 | HR 71 | Temp 97.7°F | Resp 17 | Ht 63.5 in | Wt 214.2 lb

## 2022-11-17 DIAGNOSIS — Z86718 Personal history of other venous thrombosis and embolism: Secondary | ICD-10-CM | POA: Diagnosis not present

## 2022-11-17 DIAGNOSIS — Z01818 Encounter for other preprocedural examination: Secondary | ICD-10-CM

## 2022-11-17 DIAGNOSIS — Z01810 Encounter for preprocedural cardiovascular examination: Secondary | ICD-10-CM | POA: Insufficient documentation

## 2022-11-17 DIAGNOSIS — I10 Essential (primary) hypertension: Secondary | ICD-10-CM | POA: Diagnosis not present

## 2022-11-17 DIAGNOSIS — R0609 Other forms of dyspnea: Secondary | ICD-10-CM | POA: Diagnosis not present

## 2022-11-17 DIAGNOSIS — K449 Diaphragmatic hernia without obstruction or gangrene: Secondary | ICD-10-CM | POA: Diagnosis not present

## 2022-11-17 DIAGNOSIS — R1010 Upper abdominal pain, unspecified: Secondary | ICD-10-CM | POA: Insufficient documentation

## 2022-11-17 DIAGNOSIS — E785 Hyperlipidemia, unspecified: Secondary | ICD-10-CM | POA: Diagnosis not present

## 2022-11-17 DIAGNOSIS — R1310 Dysphagia, unspecified: Secondary | ICD-10-CM | POA: Insufficient documentation

## 2022-11-17 LAB — APTT: aPTT: 27 seconds (ref 24–36)

## 2022-11-17 LAB — CBC
HCT: 42.3 % (ref 36.0–46.0)
Hemoglobin: 14 g/dL (ref 12.0–15.0)
MCH: 29.6 pg (ref 26.0–34.0)
MCHC: 33.1 g/dL (ref 30.0–36.0)
MCV: 89.4 fL (ref 80.0–100.0)
Platelets: 393 10*3/uL (ref 150–400)
RBC: 4.73 MIL/uL (ref 3.87–5.11)
RDW: 15 % (ref 11.5–15.5)
WBC: 8.1 10*3/uL (ref 4.0–10.5)
nRBC: 0 % (ref 0.0–0.2)

## 2022-11-17 LAB — COMPREHENSIVE METABOLIC PANEL
ALT: 27 U/L (ref 0–44)
AST: 26 U/L (ref 15–41)
Albumin: 3.6 g/dL (ref 3.5–5.0)
Alkaline Phosphatase: 102 U/L (ref 38–126)
Anion gap: 12 (ref 5–15)
BUN: 15 mg/dL (ref 6–20)
CO2: 27 mmol/L (ref 22–32)
Calcium: 9.3 mg/dL (ref 8.9–10.3)
Chloride: 97 mmol/L — ABNORMAL LOW (ref 98–111)
Creatinine, Ser: 1.33 mg/dL — ABNORMAL HIGH (ref 0.44–1.00)
GFR, Estimated: 46 mL/min — ABNORMAL LOW (ref >60)
Glucose, Bld: 116 mg/dL — ABNORMAL HIGH (ref 70–99)
Potassium: 2.6 mmol/L — CL (ref 3.5–5.1)
Sodium: 136 mmol/L (ref 135–145)
Total Bilirubin: 0.5 mg/dL (ref 0.3–1.2)
Total Protein: 7.2 g/dL (ref 6.5–8.1)

## 2022-11-17 LAB — PROTIME-INR
INR: 1.1 (ref 0.8–1.2)
Prothrombin Time: 14.2 seconds (ref 11.4–15.2)

## 2022-11-17 LAB — SURGICAL PCR SCREEN
MRSA, PCR: NEGATIVE
Staphylococcus aureus: NEGATIVE

## 2022-11-17 NOTE — Progress Notes (Signed)
Lab called to report a critical potassium of 2.6.   Notified Darius Bump, RN at [facility], as well as Antionette Poles, PA-C and Shonna Chock PA-C.

## 2022-11-17 NOTE — Progress Notes (Signed)
PCP - Saralyn Pilar, PA Cardiologist - denies Pulmonology: [facility]  PPM/ICD - denies  Chest x-ray -  EKG - 06/10/2022 Stress Test -  ECHO - 03/01/2019 Cardiac Cath -   Sleep Study - denies CPAP - denies  Non-diabetic  Blood Thinner Instructions:Eliquis, last dose 11/16/2022 Aspirin Instructions:denies  ERAS Protcol -No, NPO PRE-SURGERY Ensure or G2-   COVID TEST- n/a  Anesthesia review: Yes, dyspnea on exertion, HTN, high cholesterol  Patient denies shortness of breath, fever, cough and chest pain at PAT appointment   All instructions explained to the patient, with a verbal understanding of the material. Patient agrees to go over the instructions while at home for a better understanding. Patient also instructed to self quarantine after being tested for COVID-19. The opportunity to ask questions was provided.

## 2022-11-18 NOTE — Progress Notes (Signed)
Anesthesia Chart Review:  62 year old female with history of DOE, HTN, HLD, DVT, R breast cancer sp lumpectomy/XRT 2023, paraesophageal hernia sp repair/gastropexy 06/12/22 with Dr. Cliffton Asters.   Recently evaluated by pulmonologist Dr. Thora Lance for RML nodule.  She is noted to have a history of cough and dyspnea with exertion for the past couple years.  Last seen 06/25/2022.  Per note, nodule has been stable over 4 years of surveillance and felt to be likely benign.  She was recommended continue albuterol as needed for cough and SOB.   Follows with Dr. Cliffton Asters for paraesophageal hernia s/p repair on 06/12/2022.  Last seen in follow-up on 6 07/11/2022 and discussed some persistent dysphagia.  Decided to proceed with EGD and dilation.   Recently underwent laparoscopic cholecystectomy on 07/29/2022.  Patient reports last dose Eliquis 11/16/2022.  Preop labs reviewed, severe hypokalemia potassium 2.6 (has history of mild chronic hypokalemia), creatinine elevated 1.33 (consistent with prior history), otherwise unremarkable.  Critical potassium was called to surgeon's office, Dr. Cliffton Asters aware.   EKG 06/10/2022: NSR.  Rate 74.   TTE 03/01/2019:  1. Left ventricular ejection fraction, by visual estimation, is 60 to  65%. The left ventricle has normal function. Normal left ventricular size.  There is no left ventricular hypertrophy.   2. Global right ventricle has normal systolic function.The right  ventricular size is normal. No increase in right ventricular wall  thickness.   3. Left atrial size was mildly dilated.   4. Right atrial size was normal.   5. The mitral valve is normal in structure. Trace mitral valve  regurgitation. No evidence of mitral stenosis.   6. The tricuspid valve is normal in structure. Tricuspid valve  regurgitation is mild.   7. The aortic valve is normal in structure. Aortic valve regurgitation  was not visualized by color flow Doppler. Structurally normal aortic  valve, with  no evidence of sclerosis or stenosis.   8. The pulmonic valve was normal in structure. Pulmonic valve  regurgitation is not visualized by color flow Doppler.   9. Normal pulmonary artery systolic pressure.  10. The inferior vena cava is normal in size with greater than 50%  respiratory variability, suggesting right atrial pressure of 3 mmHg.    Zannie Cove [facility] Short Stay Center/Anesthesiology Phone [phone] 11/18/2022 2:51 PM

## 2022-11-18 NOTE — Anesthesia Preprocedure Evaluation (Signed)
Anesthesia Evaluation  Patient identified by MRN, date of birth, ID band Patient awake    Reviewed: Allergy & Precautions, H&P , NPO status , Patient's Chart, lab work & pertinent test results  Airway Mallampati: II  TM Distance: >3 FB Neck ROM: Full    Dental no notable dental hx.    Pulmonary neg pulmonary ROS   Pulmonary exam normal breath sounds clear to auscultation       Cardiovascular hypertension, Pt. on medications Normal cardiovascular exam Rhythm:Regular Rate:Normal     Neuro/Psych negative neurological ROS  negative psych ROS   GI/Hepatic Neg liver ROS,GERD  Medicated,,  Endo/Other  negative endocrine ROS    Renal/GU Renal InsufficiencyRenal diseaseChronic hypokalemia secondary to diuretics  negative genitourinary   Musculoskeletal  (+)  Fibromyalgia -  Abdominal   Peds negative pediatric ROS (+)  Hematology negative hematology ROS (+)   Anesthesia Other Findings   Reproductive/Obstetrics negative OB ROS                             Anesthesia Physical Anesthesia Plan  ASA: 2  Anesthesia Plan: General   Post-op Pain Management: Minimal or no pain anticipated   Induction: Intravenous  PONV Risk Score and Plan: 3 and Ondansetron, Dexamethasone and Treatment may vary due to age or medical condition  Airway Management Planned: Oral ETT  Additional Equipment:   Intra-op Plan:   Post-operative Plan: Extubation in OR  Informed Consent: I have reviewed the patients History and Physical, chart, labs and discussed the procedure including the risks, benefits and alternatives for the proposed anesthesia with the patient or authorized representative who has indicated his/her understanding and acceptance.     Dental advisory given  Plan Discussed with: CRNA and Surgeon  Anesthesia Plan Comments: (PAT note by Antionette Poles, PA-C: 62 year old female with history of DOE, HTN,  HLD, DVT, R breast cancer sp lumpectomy/XRT 2023, paraesophageal hernia sp repair/gastropexy 06/12/22 with Dr. Cliffton Asters.  Recently evaluated by pulmonologist Dr. Thora Lance for RML nodule.  She is noted to have a history of cough and dyspnea with exertion for the past couple years.  Last seen 06/25/2022.  Per note, nodule has been stable over 4 years of surveillance and felt to be likely benign.  She was recommended continue albuterol as needed for cough and SOB.  Follows with Dr. Cliffton Asters for paraesophageal hernia s/p repair on 06/12/2022.  Last seen in follow-up on 6 07/11/2022 and discussed some persistent dysphagia.  Decided to proceed with EGD and dilation.  Recently underwent laparoscopic cholecystectomy on 07/29/2022.  Patient reports last dose Eliquis 11/16/2022.  Preop labs reviewed, severe hypokalemia potassium 2.6 (has history of mild chronic hypokalemia), creatinine elevated 1.33 (consistent with prior history), otherwise unremarkable.  Critical potassium was called to surgeon's office, Dr. Cliffton Asters aware.  EKG 06/10/2022: NSR.  Rate 74.  TTE 03/01/2019: 1. Left ventricular ejection fraction, by visual estimation, is 60 to  65%. The left ventricle has normal function. Normal left ventricular size.  There is no left ventricular hypertrophy.  2. Global right ventricle has normal systolic function.The right  ventricular size is normal. No increase in right ventricular wall  thickness.  3. Left atrial size was mildly dilated.  4. Right atrial size was normal.  5. The mitral valve is normal in structure. Trace mitral valve  regurgitation. No evidence of mitral stenosis.  6. The tricuspid valve is normal in structure. Tricuspid valve  regurgitation is mild.  7.  The aortic valve is normal in structure. Aortic valve regurgitation  was not visualized by color flow Doppler. Structurally normal aortic  valve, with no evidence of sclerosis or stenosis.  8. The pulmonic valve was normal  in structure. Pulmonic valve  regurgitation is not visualized by color flow Doppler.  9. Normal pulmonary artery systolic pressure.  10. The inferior vena cava is normal in size with greater than 50%  respiratory variability, suggesting right atrial pressure of 3 mmHg.   )        Anesthesia Quick Evaluation

## 2022-11-19 ENCOUNTER — Ambulatory Visit (HOSPITAL_BASED_OUTPATIENT_CLINIC_OR_DEPARTMENT_OTHER): Payer: Medicare Other | Admitting: Anesthesiology

## 2022-11-19 ENCOUNTER — Ambulatory Visit (HOSPITAL_COMMUNITY): Payer: Medicare Other

## 2022-11-19 ENCOUNTER — Ambulatory Visit (HOSPITAL_COMMUNITY)
Admission: RE | Admit: 2022-11-19 | Discharge: 2022-11-19 | Disposition: A | Discharge status: Home / Self Care | Payer: Medicare Other | Attending: Thoracic Surgery (Cardiothoracic Vascular Surgery) | Admitting: Thoracic Surgery (Cardiothoracic Vascular Surgery)

## 2022-11-19 ENCOUNTER — Ambulatory Visit (HOSPITAL_COMMUNITY): Payer: Self-pay | Admitting: Physician Assistant

## 2022-11-19 ENCOUNTER — Encounter (HOSPITAL_COMMUNITY)
Admission: RE | Disposition: A | Discharge status: Home / Self Care | Payer: Self-pay | Source: Home / Self Care | Attending: Thoracic Surgery (Cardiothoracic Vascular Surgery)

## 2022-11-19 ENCOUNTER — Other Ambulatory Visit: Payer: Self-pay

## 2022-11-19 ENCOUNTER — Encounter (HOSPITAL_COMMUNITY): Payer: Self-pay | Admitting: Thoracic Surgery (Cardiothoracic Vascular Surgery)

## 2022-11-19 DIAGNOSIS — I10 Essential (primary) hypertension: Secondary | ICD-10-CM | POA: Insufficient documentation

## 2022-11-19 DIAGNOSIS — R1319 Other dysphagia: Secondary | ICD-10-CM | POA: Insufficient documentation

## 2022-11-19 DIAGNOSIS — R1310 Dysphagia, unspecified: Secondary | ICD-10-CM

## 2022-11-19 DIAGNOSIS — Z9889 Other specified postprocedural states: Secondary | ICD-10-CM | POA: Diagnosis not present

## 2022-11-19 DIAGNOSIS — M797 Fibromyalgia: Secondary | ICD-10-CM | POA: Insufficient documentation

## 2022-11-19 HISTORY — PX: ESOPHAGOGASTRODUODENOSCOPY: SHX5428

## 2022-11-19 LAB — POCT I-STAT, CHEM 8
BUN: 13 mg/dL (ref 6–20)
Calcium, Ion: 1.02 mmol/L — ABNORMAL LOW (ref 1.15–1.40)
Chloride: 101 mmol/L (ref 98–111)
Creatinine, Ser: 1.2 mg/dL — ABNORMAL HIGH (ref 0.44–1.00)
Glucose, Bld: 127 mg/dL — ABNORMAL HIGH (ref 70–99)
HCT: 41 % (ref 36.0–46.0)
Hemoglobin: 13.9 g/dL (ref 12.0–15.0)
Potassium: 2.8 mmol/L — ABNORMAL LOW (ref 3.5–5.1)
Sodium: 135 mmol/L (ref 135–145)
TCO2: 24 mmol/L (ref 22–32)

## 2022-11-19 SURGERY — EGD (ESOPHAGOGASTRODUODENOSCOPY)
Anesthesia: General

## 2022-11-19 MED ORDER — OXYCODONE HCL 5 MG PO TABS: 5.0000 mg | ORAL_TABLET | Freq: Once | ORAL | Status: DC | PRN

## 2022-11-19 MED ORDER — AMISULPRIDE (ANTIEMETIC) 5 MG/2ML IV SOLN
INTRAVENOUS | Status: AC
  Filled 2022-11-19: qty 4

## 2022-11-19 MED ORDER — FENTANYL CITRATE (PF) 100 MCG/2ML IJ SOLN: 25.0000 ug | INTRAMUSCULAR | Status: DC | PRN

## 2022-11-19 MED ORDER — ONDANSETRON HCL 4 MG/2ML IJ SOLN
INTRAMUSCULAR | Status: AC
  Filled 2022-11-19: qty 2

## 2022-11-19 MED ORDER — DEXAMETHASONE SODIUM PHOSPHATE 10 MG/ML IJ SOLN
INTRAMUSCULAR | Status: DC | PRN
  Administered 2022-11-19: 5 mg via INTRAVENOUS

## 2022-11-19 MED ORDER — AMISULPRIDE (ANTIEMETIC) 5 MG/2ML IV SOLN
10.0000 mg | Freq: Once | INTRAVENOUS | Status: AC
  Administered 2022-11-19: 10 mg via INTRAVENOUS

## 2022-11-19 MED ORDER — ORAL CARE MOUTH RINSE: 15.0000 mL | Freq: Once | OROMUCOSAL | Status: AC

## 2022-11-19 MED ORDER — SUCCINYLCHOLINE CHLORIDE 200 MG/10ML IV SOSY
PREFILLED_SYRINGE | INTRAVENOUS | Status: DC | PRN
  Administered 2022-11-19: 100 mg via INTRAVENOUS

## 2022-11-19 MED ORDER — OXYCODONE HCL 5 MG/5ML PO SOLN: 5.0000 mg | Freq: Once | ORAL | Status: DC | PRN

## 2022-11-19 MED ORDER — CHLORHEXIDINE GLUCONATE 0.12 % MT SOLN
15.0000 mL | Freq: Once | OROMUCOSAL | Status: AC
  Administered 2022-11-19: 15 mL via OROMUCOSAL
  Filled 2022-11-19: qty 15

## 2022-11-19 MED ORDER — PROPOFOL 10 MG/ML IV BOLUS
INTRAVENOUS | Status: AC
  Filled 2022-11-19: qty 20

## 2022-11-19 MED ORDER — ROCURONIUM BROMIDE 100 MG/10ML IV SOLN
INTRAVENOUS | Status: DC | PRN
  Administered 2022-11-19: 25 mg via INTRAVENOUS

## 2022-11-19 MED ORDER — 0.9 % SODIUM CHLORIDE (POUR BTL) OPTIME
TOPICAL | Status: DC | PRN
  Administered 2022-11-19: 1000 mL

## 2022-11-19 MED ORDER — PROPOFOL 1000 MG/100ML IV EMUL
INTRAVENOUS | Status: AC
  Filled 2022-11-19: qty 100

## 2022-11-19 MED ORDER — LIDOCAINE 2% (20 MG/ML) 5 ML SYRINGE
INTRAMUSCULAR | Status: AC
  Filled 2022-11-19: qty 5

## 2022-11-19 MED ORDER — MIDAZOLAM HCL 2 MG/2ML IJ SOLN
INTRAMUSCULAR | Status: AC
  Filled 2022-11-19: qty 2

## 2022-11-19 MED ORDER — ONDANSETRON HCL 4 MG/2ML IJ SOLN: 4.0000 mg | Freq: Once | INTRAMUSCULAR | Status: DC | PRN

## 2022-11-19 MED ORDER — DEXAMETHASONE SODIUM PHOSPHATE 10 MG/ML IJ SOLN
INTRAMUSCULAR | Status: AC
  Filled 2022-11-19: qty 1

## 2022-11-19 MED ORDER — PROPOFOL 10 MG/ML IV BOLUS
INTRAVENOUS | Status: DC | PRN
  Administered 2022-11-19: 200 mg via INTRAVENOUS

## 2022-11-19 MED ORDER — ONDANSETRON HCL 4 MG/2ML IJ SOLN
INTRAMUSCULAR | Status: DC | PRN
  Administered 2022-11-19: 4 mg via INTRAVENOUS

## 2022-11-19 MED ORDER — LIDOCAINE 2% (20 MG/ML) 5 ML SYRINGE
INTRAMUSCULAR | Status: DC | PRN
  Administered 2022-11-19: 100 mg via INTRAVENOUS

## 2022-11-19 MED ORDER — SUGAMMADEX SODIUM 200 MG/2ML IV SOLN
INTRAVENOUS | Status: DC | PRN
  Administered 2022-11-19: 200 mg via INTRAVENOUS

## 2022-11-19 MED ORDER — POTASSIUM CHLORIDE 10 MEQ/100ML IV SOLN: 10.0000 meq | Freq: Once | INTRAVENOUS | Status: AC

## 2022-11-19 MED ORDER — SODIUM CHLORIDE 0.9 % IV SOLN: INTRAVENOUS | Status: DC

## 2022-11-19 MED ORDER — MIDAZOLAM HCL 2 MG/2ML IJ SOLN
INTRAMUSCULAR | Status: DC | PRN
  Administered 2022-11-19: 2 mg via INTRAVENOUS

## 2022-11-19 MED ORDER — FENTANYL CITRATE (PF) 250 MCG/5ML IJ SOLN
INTRAMUSCULAR | Status: AC
  Filled 2022-11-19: qty 5

## 2022-11-19 MED ORDER — LACTATED RINGERS IV SOLN: INTRAVENOUS | Status: DC

## 2022-11-19 MED ORDER — POTASSIUM CHLORIDE 10 MEQ/100ML IV SOLN
INTRAVENOUS | Status: AC
  Administered 2022-11-19: 10 meq via INTRAVENOUS
  Filled 2022-11-19: qty 100

## 2022-11-19 SURGICAL SUPPLY — 24 items
BUTTON OLYMPUS DEFENDO 5 PIECE (MISCELLANEOUS) ×2 IMPLANT
CANISTER SUCT 3000ML PPV (MISCELLANEOUS) ×2 IMPLANT
CNTNR URN SCR LID CUP LEK RST (MISCELLANEOUS) ×2 IMPLANT
CONT SPEC 4OZ STRL OR WHT (MISCELLANEOUS) ×1
GAUZE SPONGE 4X4 12PLY STRL (GAUZE/BANDAGES/DRESSINGS) ×2 IMPLANT
GLOVE BIO SURGEON STRL SZ7 (GLOVE) ×2 IMPLANT
GLOVE BIO SURGEON STRL SZ7.5 (GLOVE) ×2 IMPLANT
GOWN STRL REUS W/ TWL LRG LVL3 (GOWN DISPOSABLE) IMPLANT
GOWN STRL REUS W/ TWL XL LVL3 (GOWN DISPOSABLE) ×2 IMPLANT
GOWN STRL REUS W/TWL LRG LVL3 (GOWN DISPOSABLE) ×1
GOWN STRL REUS W/TWL XL LVL3 (GOWN DISPOSABLE) ×1
GUIDEWIRE JAGWIRE PULMNRY .035 (MISCELLANEOUS) IMPLANT
JAGWIRE PULMONARY .035 (MISCELLANEOUS) ×1
MARKER SKIN DUAL TIP RULER LAB (MISCELLANEOUS) ×2 IMPLANT
NS IRRIG 1000ML POUR BTL (IV SOLUTION) ×2 IMPLANT
OIL SILICONE PENTAX (PARTS (SERVICE/REPAIRS)) IMPLANT
PAD ARMBOARD 7.5X6 YLW CONV (MISCELLANEOUS) ×4 IMPLANT
SYR 20ML ECCENTRIC (SYRINGE) ×2 IMPLANT
TOWEL GREEN STERILE (TOWEL DISPOSABLE) ×2 IMPLANT
TOWEL GREEN STERILE FF (TOWEL DISPOSABLE) ×2 IMPLANT
TUBE CONNECTING 20X1/4 (TUBING) ×2 IMPLANT
TUBING ENDO SMARTCAP (MISCELLANEOUS) ×2 IMPLANT
UNDERPAD 30X36 HEAVY ABSORB (UNDERPADS AND DIAPERS) ×2 IMPLANT
WATER STERILE IRR 1000ML POUR (IV SOLUTION) ×2 IMPLANT

## 2022-11-19 NOTE — Op Note (Signed)
      [address]       [place] [id number]             [phone]        11/19/2022  Patient:  [name] Pre-Op Dx: s/p paraesophageal hernia repair with fundoplication Dysphagia   Post-op Dx:  same Procedure: - Esophagogastroscopy - Savory Dilation up to 66F   Surgeon and Role:      * Corliss Skains, MD - Primary Anesthesia  general EBL:  none Blood Administration: none Specimen:  none   Counts: correct   Indications: 62 year old female presents today in follow-up for further discussion of her dysphagia. She describes dysphagia mostly in the cervical phase which is associated mostly with rice and pasta. She denies any reflux. Personally reviewed her most recent swallow study. Contrast as well as barium pill passed to the GE junction without much delay. She does have a prominent cricopharyngeus muscle. I gave her the option of performing the dilation, but I do not think that this will significantly improve her symptoms. She is willing to give this a try. She is scheduled for an EGD and savory dilation.   Findings: Esophagus was widely patent.  We were able to cross the GE junction without difficulty.  We dilated up to a 66F without any significant resistance.  Operative Technique: After the risks, benefits and alternatives were thoroughly discussed, the patient was brought to the operative theatre.  Anesthesia was induced. The patient was prepped and draped in normal sterile fashion.  An appropriate surgical pause was performed, and pre-operative antibiotics were dosed accordingly.  The gastroscope was advanced through the oropharynx into the cervical esophagus under direct visualization.  The scope was passed into the stomach.  The scope was then pulled back, and the esophageal mucosa was visualized.   Next a Jag wire was passed through the gastroscope into the stomach with fluoroscopic guidance.  The esophageal dilation was performed up to a 66F  dilator.  This was left in place for 10 minutes.    Once removed, we visualized the stomach and esophagus again.  There was no evidence of mucosal injury.   The patient tolerated the procedure without any immediate complications, and was transferred to the PACU in stable condition.  Roxine Whittinghill Keane Scrape

## 2022-11-19 NOTE — Interval H&P Note (Signed)
History and Physical Interval Note:  11/19/2022 3:08 PM  [name]  has presented today for surgery, with the diagnosis of S/P HIATAL HERNIA REPAIR.  The various methods of treatment have been discussed with the patient and family. After consideration of risks, benefits and other options for treatment, the patient has consented to  Procedure(s): ESOPHAGOGASTRODUODENOSCOPY (EGD) WITH SAVARY DILATION (N/A) as a surgical intervention.  The patient's history has been reviewed, patient examined, no change in status, stable for surgery.  I have reviewed the patient's chart and labs.  Questions were answered to the patient's satisfaction.    No changes since last appt  Vitals:   11/19/22 1245  BP: 111/84  Pulse: 65  Resp: 18  Temp: 98.1 F (36.7 C)  SpO2: 97%   Alert NAD Sinus EWOB ND  OR today for EGD with dilation  Noell Lorensen Keane Scrape

## 2022-11-19 NOTE — Anesthesia Procedure Notes (Signed)
Procedure Name: Intubation Date/Time: 11/19/2022 3:50 PM  Performed by: Laruth Bouchard., CRNAPre-anesthesia Checklist: Patient identified, Emergency Drugs available, Suction available and Patient being monitored Patient Re-evaluated:Patient Re-evaluated prior to induction Oxygen Delivery Method: Circle system utilized Preoxygenation: Pre-oxygenation with 100% oxygen Induction Type: IV induction Ventilation: Mask ventilation without difficulty Grade View: Grade III Tube type: Oral Tube size: 7.0 mm Number of attempts: 1 Airway Equipment and Method: Stylet, Bougie stylet and Oral airway Placement Confirmation: ETT inserted through vocal cords under direct vision, positive ETCO2 and breath sounds checked- equal and bilateral Secured at: 22 cm Tube secured with: Tape Dental Injury: Teeth and Oropharynx as per pre-operative assessment  Difficulty Due To: Difficult Airway- due to anterior larynx Comments: By Morrie Sheldon, SRNA. Very limited grade III view with MAC 3. Used bougie without issue

## 2022-11-19 NOTE — Transfer of Care (Signed)
Immediate Anesthesia Transfer of Care Note  Patient: [name]  Procedure(s) Performed: ESOPHAGOGASTRODUODENOSCOPY (EGD) WITH SAVARY DILATION  Patient Location: PACU  Anesthesia Type:General  Level of Consciousness: awake, alert , and oriented  Airway & Oxygen Therapy: Patient Spontanous Breathing and Patient connected to nasal cannula oxygen  Post-op Assessment: Report given to RN and Post -op Vital signs reviewed and stable  Post vital signs: Reviewed and stable  Last Vitals:  Vitals Value Taken Time  BP 103/56 11/19/22 1645  Temp 36.6 C 11/19/22 1636  Pulse 63 11/19/22 1653  Resp 17 11/19/22 1653  SpO2 93 % 11/19/22 1653  Vitals shown include unvalidated device data.  Last Pain:  Vitals:   11/19/22 1636  TempSrc:   PainSc: 0-No pain         Complications: No notable events documented.

## 2022-11-19 NOTE — Discharge Summary (Signed)
Physician Discharge Summary   Patient ID: [name] [id number] 61 y.o. 12-Sep-1960  Admit date: 11/19/2022  Discharge date and time: No discharge date for patient encounter.   Admitting Physician: Corliss Skains, MD   Discharge Physician: Corliss Skains   Admission Diagnoses: S/P HIATAL HERNIA REPAIR  Discharge Diagnoses: dysphagia  Admission Condition: good  Discharged Condition: good  Indication for Admission: Outpatient procedure  Hospital Course: uncomplicated  Consults: None    Disposition: Discharge disposition: 01-Home or Self Care       Patient Instructions:  Allergies as of 11/19/2022       Reactions   Tramadol Itching   "bugs crawling all over"        Medication List     TAKE these medications    albuterol 108 (90 Base) MCG/ACT inhaler Commonly known as: VENTOLIN HFA Inhale 2 puffs into the lungs every 6 (six) hours as needed for wheezing or shortness of breath.   amLODipine 5 MG tablet Commonly known as: NORVASC Take 1 tablet (5 mg total) by mouth daily.   cloNIDine 0.1 MG tablet Commonly known as: CATAPRES Take 1 tablet by mouth twice daily   dextromethorphan-guaiFENesin 30-600 MG 12hr tablet Commonly known as: MUCINEX DM Take 1 tablet by mouth 2 (two) times daily. Crush tablet before taking   Eliquis 5 MG Tabs tablet Generic drug: apixaban TAKE 1 TABLET BY MOUTH TWICE DAILY *CRUSH  TABLET  BEFORE  TAKING*   FLUoxetine 20 MG tablet Commonly known as: PROZAC Take 2 tablets (40 mg total) by mouth daily. Crush tablets before taking.   hydrochlorothiazide 50 MG tablet Commonly known as: HYDRODIURIL Take 1 tablet (50 mg total) by mouth daily. Crush tablet before taking   metoprolol succinate 25 MG 24 hr tablet Commonly known as: TOPROL-XL Take 1 tablet (25 mg total) by mouth daily. Split tablet in 1/2 prior to taking.   ondansetron 4 MG disintegrating tablet Commonly known as: ZOFRAN-ODT Take 1 tablet (4 mg total)  by mouth every 6 (six) hours as needed for nausea.   oxyCODONE 5 MG immediate release tablet Commonly known as: Oxy IR/ROXICODONE Take 1 tablet (5 mg total) by mouth every 4 (four) hours as needed for moderate pain.       Activity: activity as tolerated Diet: regular diet Wound Care: none needed  Follow-up with Dr. Cliffton Asters in 1 week.  SignedCorliss Skains 11/19/2022 4:42 PM

## 2022-11-20 ENCOUNTER — Encounter (HOSPITAL_COMMUNITY): Payer: Self-pay | Admitting: Thoracic Surgery (Cardiothoracic Vascular Surgery)

## 2022-11-20 NOTE — Anesthesia Postprocedure Evaluation (Signed)
Anesthesia Post Note  Patient: [name]  Procedure(s) Performed: ESOPHAGOGASTRODUODENOSCOPY (EGD) WITH SAVARY DILATION     Patient location during evaluation: PACU Anesthesia Type: General Level of consciousness: awake and alert Pain management: pain level controlled Vital Signs Assessment: post-procedure vital signs reviewed and stable Respiratory status: spontaneous breathing, nonlabored ventilation, respiratory function stable and patient connected to nasal cannula oxygen Cardiovascular status: blood pressure returned to baseline and stable Postop Assessment: no apparent nausea or vomiting Anesthetic complications: no   No notable events documented.  Last Vitals:  Vitals:   11/19/22 1715 11/19/22 1730  BP: (!) 115/58 104/72  Pulse: 60 (!) 57  Resp: (!) 21 (!) 21  Temp:  36.6 C  SpO2: 95% 96%    Last Pain:  Vitals:   11/19/22 1730  TempSrc:   PainSc: 0-No pain                 Collene Schlichter

## 2022-11-28 ENCOUNTER — Ambulatory Visit (INDEPENDENT_AMBULATORY_CARE_PROVIDER_SITE_OTHER): Payer: Self-pay | Admitting: Thoracic Surgery (Cardiothoracic Vascular Surgery)

## 2022-11-28 DIAGNOSIS — R1310 Dysphagia, unspecified: Secondary | ICD-10-CM

## 2022-11-28 NOTE — Progress Notes (Signed)
     [address]       [place] [address]             [phone]       Patient: Home Provider: Office Consent for Telemedicine visit obtained.  Today's visit was completed via a real-time telehealth (see specific modality noted below). The patient/authorized person provided oral consent at the time of the visit to engage in a telemedicine encounter with the present provider at [facility]. The patient/authorized person was informed of the potential benefits, limitations, and risks of telemedicine. The patient/authorized person expressed understanding that the laws that protect confidentiality also apply to telemedicine. The patient/authorized person acknowledged understanding that telemedicine does not provide emergency services and that he or she would need to call 911 or proceed to the nearest hospital for help if such a need arose.   Total time spent in the clinical discussion 10 minutes.  Telehealth Modality: Phone visit (audio only)  I had a telephone visit with Mrs. Hughlett.  She did not notice much of a difference after the esophageal dilation.  She complains mostly of mucus in her cervical esophagus.  Her previous dysphagia was also during the cervical phase.  I have given her an appointment with Dr. Suszanne Conners for evaluation.  Of note, on her esophagram, there does appear to have a prominent cricopharyngeus muscle, which could contribute to her dysphagia.  Vane Yapp Keane Scrape

## 2022-12-11 ENCOUNTER — Other Ambulatory Visit: Payer: Self-pay | Admitting: Family Medicine

## 2022-12-11 DIAGNOSIS — I2699 Other pulmonary embolism without acute cor pulmonale: Secondary | ICD-10-CM

## 2022-12-12 ENCOUNTER — Other Ambulatory Visit: Payer: Self-pay

## 2022-12-12 ENCOUNTER — Other Ambulatory Visit: Payer: Self-pay | Admitting: Family Medicine

## 2022-12-12 DIAGNOSIS — E538 Deficiency of other specified B group vitamins: Secondary | ICD-10-CM

## 2022-12-12 DIAGNOSIS — I10 Essential (primary) hypertension: Secondary | ICD-10-CM

## 2022-12-12 DIAGNOSIS — Z0000 Encounter for general adult medical examination without abnormal findings: Secondary | ICD-10-CM

## 2022-12-22 ENCOUNTER — Other Ambulatory Visit: Payer: Medicare Other

## 2022-12-22 DIAGNOSIS — Z0000 Encounter for general adult medical examination without abnormal findings: Secondary | ICD-10-CM

## 2022-12-22 DIAGNOSIS — E538 Deficiency of other specified B group vitamins: Secondary | ICD-10-CM

## 2022-12-22 DIAGNOSIS — I10 Essential (primary) hypertension: Secondary | ICD-10-CM

## 2022-12-29 ENCOUNTER — Ambulatory Visit (INDEPENDENT_AMBULATORY_CARE_PROVIDER_SITE_OTHER): Payer: Medicare Other | Admitting: Family Medicine

## 2022-12-29 ENCOUNTER — Encounter: Payer: Self-pay | Admitting: Family Medicine

## 2022-12-29 VITALS — BP 114/80 | HR 90 | Resp 18 | Ht 63.5 in | Wt 206.0 lb

## 2022-12-29 DIAGNOSIS — N1831 Chronic kidney disease, stage 3a: Secondary | ICD-10-CM | POA: Diagnosis not present

## 2022-12-29 DIAGNOSIS — D229 Melanocytic nevi, unspecified: Secondary | ICD-10-CM | POA: Insufficient documentation

## 2022-12-29 DIAGNOSIS — I10 Essential (primary) hypertension: Secondary | ICD-10-CM

## 2022-12-29 DIAGNOSIS — F32A Depression, unspecified: Secondary | ICD-10-CM | POA: Diagnosis not present

## 2022-12-29 DIAGNOSIS — E7800 Pure hypercholesterolemia, unspecified: Secondary | ICD-10-CM

## 2022-12-29 DIAGNOSIS — E559 Vitamin D deficiency, unspecified: Secondary | ICD-10-CM | POA: Insufficient documentation

## 2022-12-29 MED ORDER — METOPROLOL SUCCINATE ER 25 MG PO TB24
25.0000 mg | ORAL_TABLET | Freq: Every day | ORAL | 0 refills | Status: DC
Start: 2022-12-29 — End: 2023-05-08

## 2022-12-29 MED ORDER — VITAMIN D (ERGOCALCIFEROL) 1.25 MG (50000 UNIT) PO CAPS
50000.0000 [IU] | ORAL_CAPSULE | ORAL | 0 refills | Status: DC
Start: 2022-12-29 — End: 2024-01-13

## 2022-12-29 MED ORDER — FLUOXETINE HCL 20 MG PO TABS
40.0000 mg | ORAL_TABLET | Freq: Every day | ORAL | 0 refills | Status: DC
Start: 2022-12-29 — End: 2023-03-02

## 2022-12-29 MED ORDER — AMLODIPINE BESYLATE 5 MG PO TABS
5.0000 mg | ORAL_TABLET | Freq: Every day | ORAL | 1 refills | Status: DC
Start: 2022-12-29 — End: 2023-09-09

## 2022-12-29 MED ORDER — HYDROCHLOROTHIAZIDE 50 MG PO TABS
50.0000 mg | ORAL_TABLET | Freq: Every day | ORAL | 1 refills | Status: DC
Start: 2022-12-29 — End: 2023-07-13

## 2022-12-29 MED ORDER — CLONIDINE HCL 0.1 MG PO TABS
0.1000 mg | ORAL_TABLET | Freq: Two times a day (BID) | ORAL | 1 refills | Status: DC
Start: 2022-12-29 — End: 2023-07-13

## 2022-12-29 MED ORDER — BUPROPION HCL ER (SR) 150 MG PO TB12: 150.0000 mg | ORAL_TABLET | Freq: Two times a day (BID) | ORAL | 1 refills | Status: DC

## 2022-12-29 NOTE — Assessment & Plan Note (Addendum)
BP goal <140/90.  Stable, at goal.  Continue amlodipine 5 mg daily, clonidine 0.1 mg twice daily, hydrochlorothiazide 50 mg daily, metoprolol succinate 25 mg daily. Previously, lisinopril was discontinued due to renal declining, patient was instead started on clonidine.  At next appointment, if blood pressure is still well-controlled may consider reducing clonidine. CKD fairly stable.

## 2022-12-29 NOTE — Assessment & Plan Note (Signed)
Last lipid panel: LDL 178, HDL 49, triglycerides 300. The 10-year ASCVD risk score (Arnett DK, et al., 2019) is: 5%.  Last lipid panel prior to most recent was collected in 2020.  She has not taken atorvastatin 10 mg for several years now.  She has had several surgeries and is less physically active than she is used to the past several months.  We will recheck lipid panel in about 6 months and adjust management as indicated.

## 2022-12-29 NOTE — Assessment & Plan Note (Signed)
PHQ-9 score increased to 21.  Continue Prozac 20 mg daily, add Wellbutrin 150 mg once daily for 3-4 weeks, then increase to twice daily as tolerated.  Follow-up in 6-8 weeks to assess efficacy and adjust as needed.

## 2022-12-29 NOTE — Assessment & Plan Note (Signed)
Stable, most recent creatinine 1.35, EGFR 45.  Will continue to monitor.  Consider discontinuing clonidine if blood pressure remains stable at next appointment.

## 2022-12-29 NOTE — Assessment & Plan Note (Signed)
Start prescription strength vitamin D 50,000 units once weekly for 3 months, then switch to over-the-counter vitamin D3 2000 units daily for maintenance.

## 2022-12-29 NOTE — Patient Instructions (Signed)
Start the Wellbutrin once each day in the morning for 3-4 weeks. If you are tolerating it well, you can increase to taking it twice each day at that time!

## 2022-12-29 NOTE — Progress Notes (Signed)
Established Patient Office Visit  Subjective   Patient ID: [name], female    DOB: 06/09/1960  Age: 62 y.o. MRN: [id number]  Chief Complaint  Patient presents with   Hypertension    HPI [name] is a 62 y.o. female presenting today for follow up of hypertension, mood. Hypertension: Patient here for follow-up of elevated blood pressure. She is exercising and is adherent to low salt diet.   Pt denies chest pain, SOB, dizziness, edema, syncope, fatigue or heart palpitations. Taking amlodipine, metoprolol succinate, HCTZ, and clonidine, reports excellent compliance with treatment. Denies side effects. Mood: Patient is here to follow up for depression and anxiety, currently managing with Prozac. Taking medication without side effects, reports excellent compliance with treatment. Denies mood changes or SI/HI. She feels mood is worse since last visit.  She would like to discuss options to improve her symptoms.     12/29/2022    1:10 PM 06/23/2022    1:16 PM 02/20/2022    3:37 PM  Depression screen PHQ 2/9  Decreased Interest 3 2 1   Down, Depressed, Hopeless 3 2 2   PHQ - 2 Score 6 4 3   Altered sleeping 3 2 2   Tired, decreased energy 3 2 2   Change in appetite 3 0 0  Feeling bad or failure about yourself  3 1 1   Trouble concentrating 3 1 0  Moving slowly or fidgety/restless 0 0 0  Suicidal thoughts 0 0 0  PHQ-9 Score 21 10 8   Difficult doing work/chores Somewhat difficult         12/29/2022    1:10 PM 06/23/2022    1:16 PM 02/20/2022    3:38 PM 11/20/2021    3:26 PM  GAD 7 : Generalized Anxiety Score  Nervous, Anxious, on Edge 3 2 2 2   Control/stop worrying 3 2 2 2   Worry too much - different things 3 2 2 2   Trouble relaxing 3 2 0 2  Restless 0 1 0 0  Easily annoyed or irritable 0 0 0 0  Afraid - awful might happen 3 2 1 1   Total GAD 7 Score 15 11 7 9   Anxiety Difficulty Somewhat difficult   Somewhat difficult   ROS Negative unless otherwise noted in HPI   Objective:      BP 114/80 (BP Location: Left Arm, Patient Position: Sitting, Cuff Size: Normal)   Pulse 90   Resp 18   Ht 5' 3.5" (1.613 m)   Wt 206 lb (93.4 kg)   LMP 02/07/2013   SpO2 97%   BMI 35.92 kg/m   Physical Exam Constitutional:      General: She is not in acute distress.    Appearance: Normal appearance.  HENT:     Head: Normocephalic and atraumatic.  Cardiovascular:     Rate and Rhythm: Normal rate and regular rhythm.     Heart sounds: No murmur heard.    No friction rub. No gallop.  Pulmonary:     Effort: Pulmonary effort is normal. No respiratory distress.     Breath sounds: No wheezing, rhonchi or rales.  Musculoskeletal:     Cervical back: Normal range of motion.  Skin:    General: Skin is warm and dry.  Neurological:     General: No focal deficit present.     Mental Status: She is alert and oriented to person, place, and time. Mental status is at baseline.  Psychiatric:        Mood and Affect:  Mood normal.        Thought Content: Thought content normal.        Judgment: Judgment normal.      Assessment & Plan:  Essential hypertension Assessment & Plan: BP goal <140/90.  Stable, at goal.  Continue amlodipine 5 mg daily, clonidine 0.1 mg twice daily, hydrochlorothiazide 50 mg daily, metoprolol succinate 25 mg daily. Previously, lisinopril was discontinued due to renal declining, patient was instead started on clonidine.  At next appointment, if blood pressure is still well-controlled may consider reducing clonidine. CKD fairly stable.   Orders: -     amLODIPine Besylate; Take 1 tablet (5 mg total) by mouth daily.  Dispense: 90 tablet; Refill: 1 -     cloNIDine HCl; Take 1 tablet (0.1 mg total) by mouth 2 (two) times daily.  Dispense: 180 tablet; Refill: 1 -     hydroCHLOROthiazide; Take 1 tablet (50 mg total) by mouth daily. Crush tablet before taking  Dispense: 90 tablet; Refill: 1 -     Metoprolol Succinate ER; Take 1 tablet (25 mg total) by mouth daily. Split  tablet in 1/2 prior to taking.  Dispense: 90 tablet; Refill: 0  Depression, unspecified depression type Assessment & Plan: PHQ-9 score increased to 21.  Continue Prozac 20 mg daily, add Wellbutrin 150 mg once daily for 3-4 weeks, then increase to twice daily as tolerated.  Follow-up in 6-8 weeks to assess efficacy and adjust as needed.  Orders: -     FLUoxetine HCl; Take 2 tablets (40 mg total) by mouth daily. Crush tablets before taking.  Dispense: 180 tablet; Refill: 0 -     buPROPion HCl ER (SR); Take 1 tablet (150 mg total) by mouth 2 (two) times daily.  Dispense: 180 tablet; Refill: 1  Hypercholesteremia Assessment & Plan: Last lipid panel: LDL 178, HDL 49, triglycerides 300. The 10-year ASCVD risk score (Arnett DK, et al., 2019) is: 5%.  Last lipid panel prior to most recent was collected in 2020.  She has not taken atorvastatin 10 mg for several years now.  She has had several surgeries and is less physically active than she is used to the past several months.  We will recheck lipid panel in about 6 months and adjust management as indicated.   Multiple nevi -     Ambulatory referral to Dermatology  Vitamin D deficiency Assessment & Plan: Start prescription strength vitamin D 50,000 units once weekly for 3 months, then switch to over-the-counter vitamin D3 2000 units daily for maintenance.  Orders: -     Vitamin D (Ergocalciferol); Take 1 capsule (50,000 Units total) by mouth every 7 (seven) days.  Dispense: 12 capsule; Refill: 0  Stage 3a chronic kidney disease (HCC) Assessment & Plan: Stable, most recent creatinine 1.35, EGFR 45.  Will continue to monitor.  Consider discontinuing clonidine if blood pressure remains stable at next appointment.     Return in about 2 months (around 02/28/2023) for follow-up for mood, added Wellbutrin.    Melida Quitter, PA

## 2023-01-01 ENCOUNTER — Ambulatory Visit (INDEPENDENT_AMBULATORY_CARE_PROVIDER_SITE_OTHER): Payer: Medicare Other

## 2023-01-01 VITALS — Ht 63.5 in | Wt 206.0 lb

## 2023-01-01 DIAGNOSIS — Z0000 Encounter for general adult medical examination without abnormal findings: Secondary | ICD-10-CM

## 2023-01-01 DIAGNOSIS — Z1159 Encounter for screening for other viral diseases: Secondary | ICD-10-CM

## 2023-01-01 NOTE — Progress Notes (Signed)
Subjective:   [name] is a 62 y.o. female who presents for Medicare Annual (Subsequent) preventive examination.  Visit Complete: Virtual  I connected with  [name] on 01/01/23 by a audio enabled telemedicine application and verified that I am speaking with the correct person using two identifiers.  Patient Location: Home  Provider Location: Home Office  I discussed the limitations of evaluation and management by telemedicine. The patient expressed understanding and agreed to proceed.   Review of Systems    Vital Signs: Unable to obtain new vitals due to this being a telehealth visit.  Cardiac Risk Factors include: advanced age (>90men, >52 women);hypertension     Objective:    Today's Vitals   01/01/23 0943 01/01/23 0945  Weight: 206 lb (93.4 kg)   Height: 5' 3.5" (1.613 m)   PainSc:  0-No pain   Body mass index is 35.92 kg/m.     01/01/2023    9:51 AM 11/17/2022   11:11 AM 07/29/2022   12:03 PM 07/24/2022   10:56 AM 06/12/2022   10:54 AM 06/10/2022    3:20 PM 04/22/2022   11:44 AM  Advanced Directives  Does Patient Have a Medical Advance Directive? No No No No Unable to assess, patient is non-responsive or altered mental status No No  Would patient like information on creating a medical advance directive? No - Patient declined  No - Patient declined No - Patient declined No - Patient declined No - Patient declined No - Patient declined    Current Medications (verified) Outpatient Encounter Medications as of 01/01/2023  Medication Sig   albuterol (VENTOLIN HFA) 108 (90 Base) MCG/ACT inhaler Inhale 2 puffs into the lungs every 6 (six) hours as needed for wheezing or shortness of breath.   amLODipine (NORVASC) 5 MG tablet Take 1 tablet (5 mg total) by mouth daily.   buPROPion (WELLBUTRIN SR) 150 MG 12 hr tablet Take 1 tablet (150 mg total) by mouth 2 (two) times daily.   cloNIDine (CATAPRES) 0.1 MG tablet Take 1 tablet (0.1 mg total) by mouth 2 (two) times daily.    ELIQUIS 5 MG TABS tablet TAKE 1 TABLET BY MOUTH TWICE DAILY *CRUSH  TABLET  BEFORE  TAKING*   FLUoxetine (PROZAC) 20 MG tablet Take 2 tablets (40 mg total) by mouth daily. Crush tablets before taking.   hydrochlorothiazide (HYDRODIURIL) 50 MG tablet Take 1 tablet (50 mg total) by mouth daily. Crush tablet before taking   metoprolol succinate (TOPROL-XL) 25 MG 24 hr tablet Take 1 tablet (25 mg total) by mouth daily. Split tablet in 1/2 prior to taking.   Vitamin D, Ergocalciferol, (DRISDOL) 1.25 MG (50000 UNIT) CAPS capsule Take 1 capsule (50,000 Units total) by mouth every 7 (seven) days.   No facility-administered encounter medications on file as of 01/01/2023.    Allergies (verified) Tramadol   History: Past Medical History:  Diagnosis Date   Acid reflux    takes Zantac and Omeprazole daily   Anemia    Anxiety    takes Citaopram daily   Arthritis    right knee   Arthrosis    left thumb [facility]   Breast cancer (HCC)    Chronic back pain    DDD   Clotting disorder (HCC)    hx of blood clot following knee scope, pt reports hx blood clot in her lung   Depression    Dyspnea on exertion    Fibromyalgia    History of bronchitis 3+yrs ago  Hyperlipidemia    takes Atorvastatin daily   Hypertension    takes Lisinopril and HCTZ daily   Insomnia    takes Elavil nightly as needed   Paraesophageal hernia    Personal history of radiation therapy    Past Surgical History:  Procedure Laterality Date   BREAST BIOPSY Left    BREAST LUMPECTOMY     BREAST LUMPECTOMY WITH RADIOACTIVE SEED AND SENTINEL LYMPH NODE BIOPSY Right 06/03/2021   Procedure: RIGHT BREAST LUMPECTOMY WITH RADIOACTIVE SEED AND SENTINEL LYMPH NODE BIOPSY;  Surgeon: Griselda Miner, MD;  Location: [facility];  Service: General;  Laterality: Right;   BUNIONECTOMY Right 02/18/2018   Procedure: Ivory Broad;  Surgeon: Felecia Shelling, DPM;  Location: [facility];  Service: Podiatry;  Laterality: Right;    CAPSULOTOMY Bilateral 02/18/2018   Procedure: CAPSULOTOMY MPJ RELEASE JOINT 2N BILATERAL;  Surgeon: Felecia Shelling, DPM;  Location: [facility];  Service: Podiatry;  Laterality: Bilateral;   CARPOMETACARPEL SUSPENSION PLASTY Left 01/27/2018   Procedure: LEFT THUMB ligament reconstruction and tendon interposition;  Surgeon: Tarry Kos, MD;  Location: [facility];  Service: Orthopedics;  Laterality: Left;   CARPOMETACARPEL SUSPENSION PLASTY Left 03/07/2020   Procedure: REVISION LEFT THUMB CARPOMETACARPAL (CMC) ARTHROPLASTY;  Surgeon: Tarry Kos, MD;  Location: [facility];  Service: Orthopedics;  Laterality: Left;   CHOLECYSTECTOMY N/A 07/29/2022   Procedure: LAPAROSCOPIC CHOLECYSTECTOMY WITH  ICG DYE;  Surgeon: Emelia Loron, MD;  Location: [facility] OR;  Service: General;  Laterality: N/A;  RNFA  ICG DYE  TAP BLOCK   CHONDROPLASTY Right 06/28/2014   Procedure: CHONDROPLASTY;  Surgeon: Cheral Almas, MD;  Location: [facility];  Service: Orthopedics;  Laterality: Right;   COLONOSCOPY N/A 05/29/2014   Procedure: COLONOSCOPY;  Surgeon: Corbin Ade, MD;  [facility]: [facility];  Service: Endoscopy;  Laterality: N/A;  215pm- Pt is working until 12:00 so she can't come any earlier   ESOPHAGOGASTRODUODENOSCOPY N/A 05/29/2014   Procedure: ESOPHAGOGASTRODUODENOSCOPY (EGD);  Surgeon: Corbin Ade, MD;  [facility];  Service: Endoscopy;  Laterality: N/A;   ESOPHAGOGASTRODUODENOSCOPY N/A 12/17/2020   Procedure: ESOPHAGOGASTRODUODENOSCOPY (EGD);  Surgeon: Corliss Skains, MD;  Location: [facility] OR;  Service: Thoracic;  Laterality: N/A;   ESOPHAGOGASTRODUODENOSCOPY N/A 06/12/2022   Procedure: ESOPHAGOGASTRODUODENOSCOPY (EGD);  Surgeon: Corliss Skains, MD;  Location: [facility] OR;  Service: Thoracic;  Laterality: N/A;   ESOPHAGOGASTRODUODENOSCOPY N/A 11/19/2022   Procedure: ESOPHAGOGASTRODUODENOSCOPY (EGD) WITH SAVARY DILATION;  Surgeon: Corliss Skains, MD;  Location: [facility];  Service: Thoracic;  Laterality: N/A;   GANGLION CYST EXCISION Left 01/13/2002   HAMMER TOE SURGERY Bilateral 02/18/2018   Procedure: HAMMER TOE CORRECTION2ND BILATERAL;  Surgeon: Felecia Shelling, DPM;  Location: [facility];  Service: Podiatry;  Laterality: Bilateral;   HERNIA REPAIR     IRRIGATION AND DEBRIDEMENT ABSCESS Right 09/08/2021   Procedure: IRRIGATION AND DEBRIDEMENT RIGHT BREAST ABSCESS;  Surgeon: Manus Rudd, MD;  Location: [facility];  Service: General;  Laterality: Right;   KNEE ARTHROSCOPY WITH MEDIAL MENISECTOMY Right 06/28/2014   Procedure: RIGHT KNEE ARTHROSCOPY WITH PARTIAL MEDIAL MENISCECTOMY AND CHONDROPLASTY;  Surgeon: Cheral Almas, MD;  Location: [facility];  Service: Orthopedics;  Laterality: Right;   MALONEY DILATION N/A 05/29/2014   Procedure: [name] DILATION;  Surgeon: Corbin Ade, MD;  [facility];  Service: Endoscopy;  Laterality: N/A;   PARTIAL KNEE ARTHROPLASTY Right 09/15/2014   Procedure: RIGHT  UNICOMPARTMENTAL KNEE ARTHROPLASTY;  Surgeon: Tarry Kos, MD;  Location: [facility];  Service: Orthopedics;  Laterality: Right;   SHOULDER ARTHROSCOPY Right    TOTAL KNEE ARTHROPLASTY Left 04/08/2004   XI ROBOTIC ASSISTED HIATAL HERNIA REPAIR N/A 06/12/2022   Procedure: XI ROBOTIC ASSISTED HIATAL HERNIA REPAIR;  Surgeon: Corliss Skains, MD;  Location: [facility];  Service: Thoracic;  Laterality: N/A;   XI ROBOTIC ASSISTED PARAESOPHAGEAL HERNIA REPAIR N/A 12/17/2020   Procedure: XI ROBOTIC ASSISTED Laparoscopy PARAESOPHAGEAL HERNIA REPAIR WITH FUNDOPLICATION;  Surgeon: Corliss Skains, MD;  Location: [facility];  Service: Thoracic;  Laterality: N/A;   Family History  Problem Relation Age of Onset   Stroke Mother    Lung cancer Father 27   GI problems Father    Prostate cancer Father    Cancer Sister        breast   Breast cancer Sister 24   Lung cancer Maternal Grandfather    Lung cancer Paternal  Grandfather    Diabetes Son    Colon cancer Neg Hx    Esophageal cancer Neg Hx    Pancreatic cancer Neg Hx    Social History   Socioeconomic History   Marital status: Divorced    Spouse name: Not on file   Number of children: 5   Years of education: Not on file   Highest education level: Not on file  Occupational History   Occupation: Conservation officer, nature  Tobacco Use   Smoking status: Never    Passive exposure: Never   Smokeless tobacco: Never  Vaping Use   Vaping status: Never Used  Substance and Sexual Activity   Alcohol use: No    Alcohol/week: 0.0 standard drinks of alcohol   Drug use: No   Sexual activity: Not Currently    Birth control/protection: None, Post-menopausal  Other Topics Concern   Not on file  Social History Narrative   Right Handed    Lives in a one story home    Social Determinants of Health   Financial Resource Strain: Low Risk  (01/01/2023)   Overall Financial Resource Strain (CARDIA)    Difficulty of Paying Living Expenses: Not hard at all  Food Insecurity: No Food Insecurity (01/01/2023)   Hunger Vital Sign    Worried About Running Out of Food in the Last Year: Never true    Ran Out of Food in the Last Year: Never true  Transportation Needs: No Transportation Needs (01/01/2023)   PRAPARE - Administrator, Civil Service (Medical): No    Lack of Transportation (Non-Medical): No  Physical Activity: Inactive (01/01/2023)   Exercise Vital Sign    Days of Exercise per Week: 0 days    Minutes of Exercise per Session: 0 min  Stress: No Stress Concern Present (01/01/2023)   [employer] of Occupational Health - Occupational Stress Questionnaire    Feeling of Stress : Not at all  Social Connections: Socially Isolated (01/01/2023)   Social Connection and Isolation Panel [NHANES]    Frequency of Communication with Friends and Family: More than three times a week    Frequency of Social Gatherings with Friends and Family: More than three times a week     Attends Religious Services: Never    Database administrator or Organizations: No    Attends Banker Meetings: Never    Marital Status: Divorced    Tobacco Counseling Counseling given: Not Answered   Clinical Intake:  Pre-visit preparation completed: Yes  Pain :  No/denies pain Pain Score: 0-No pain     BMI - recorded: 35.92 Nutritional Status: BMI > 30  Obese Nutritional Risks: None Diabetes: No  How often do you need to have someone help you when you read instructions, pamphlets, or other written materials from your doctor or pharmacy?: 1 - Never  Interpreter Needed?: No  Information entered by :: Theresa Mulligan LPN   Activities of Daily Living    01/01/2023    9:50 AM 11/17/2022   11:14 AM  In your present state of health, do you have any difficulty performing the following activities:  Hearing? 0   Vision? 0   Difficulty concentrating or making decisions? 0   Walking or climbing stairs? 0   Dressing or bathing? 0   Doing errands, shopping? 0 0  Preparing Food and eating ? N   Using the Toilet? N   In the past six months, have you accidently leaked urine? N   Do you have problems with loss of bowel control? N   Managing your Medications? N   Managing your Finances? N   Housekeeping or managing your Housekeeping? N     Patient Care Team: Melida Quitter, PA as PCP - General (Family Medicine) Jena Gauss Gerrit Friends, MD as Consulting Physician (Gastroenterology) Tarry Kos, MD as Attending Physician (Orthopedic Surgery) Stacey Drain, MD as Consulting Physician (Rheumatology) Serena Croissant, MD as Consulting Physician (Hematology and Oncology) Dorothy Puffer, MD as Consulting Physician (Radiation Oncology) Glendale Chard, DO as Consulting Physician (Neurology) Emelia Loron, MD as Consulting Physician (General Surgery)  Indicate any recent Medical Services you may have received from other than [facility] providers in the past year (date may be  approximate).     Assessment:   This is a routine wellness examination for [name].  Hearing/Vision screen Hearing Screening - Comments:: Denies hearing difficulties   Vision Screening - Comments:: Wears rx glasses - up to date with routine eye exams with  Deferred  Dietary issues and exercise activities discussed:     Goals Addressed               This Visit's Progress     Stay Healthy (pt-stated)         Depression Screen    01/01/2023    9:49 AM 12/29/2022    1:10 PM 06/23/2022    1:16 PM 02/20/2022    3:37 PM 11/20/2021    3:26 PM 10/02/2021   10:00 AM 02/12/2021    2:01 PM  PHQ 2/9 Scores  PHQ - 2 Score 0 6 4 3 3 4 4   PHQ- 9 Score 0 21 10 8 11 11 9     Fall Risk    01/01/2023    9:50 AM 11/20/2021    3:26 PM 10/02/2021   10:00 AM 03/28/2021   11:07 AM 02/12/2021    2:02 PM  Fall Risk   Falls in the past year? 0 0 0 0 0  Number falls in past yr: 0 0 0 0 0  Injury with Fall? 0 0 0 0 0  Risk for fall due to : No Fall Risks No Fall Risks No Fall Risks    Follow up Falls prevention discussed Falls evaluation completed Falls evaluation completed  Falls evaluation completed    MEDICARE RISK AT HOME: Medicare Risk at Home Any stairs in or around the home?: Yes If so, are there any without handrails?: No Home free of loose throw rugs in walkways, pet beds, electrical cords,  etc?: Yes Adequate lighting in your home to reduce risk of falls?: Yes Life alert?: No Use of a cane, walker or w/c?: No Grab bars in the bathroom?: No Shower chair or bench in shower?: No Elevated toilet seat or a handicapped toilet?: No  TIMED UP AND GO:  Was the test performed?  No    Cognitive Function:        01/01/2023    9:51 AM 02/12/2021    2:04 PM  6CIT Screen  What Year? 0 points 0 points  What month? 0 points 0 points  What time? 0 points 0 points  Count back from 20 0 points 0 points  Months in reverse 0 points 0 points  Repeat phrase 0 points 0 points  Total Score 0  points 0 points    Immunizations Immunization History  Administered Date(s) Administered   Tdap 04/29/2013    TDAP status: Up to date  Flu Vaccine status: Due, Education has been provided regarding the importance of this vaccine. Advised may receive this vaccine at local pharmacy or Health Dept. Aware to provide a copy of the vaccination record if obtained from local pharmacy or Health Dept. Verbalized acceptance and understanding.    Covid-19 vaccine status: Declined, Education has been provided regarding the importance of this vaccine but patient still declined. Advised may receive this vaccine at local pharmacy or Health Dept.or vaccine clinic. Aware to provide a copy of the vaccination record if obtained from local pharmacy or Health Dept. Verbalized acceptance and understanding.  Qualifies for Shingles Vaccine? Yes   Zostavax completed No   Shingrix Completed?: No.    Education has been provided regarding the importance of this vaccine. Patient has been advised to call insurance company to determine out of pocket expense if they have not yet received this vaccine. Advised may also receive vaccine at local pharmacy or Health Dept. Verbalized acceptance and understanding.  Screening Tests Health Maintenance  Topic Date Due   COVID-19 Vaccine (1) Never done   Hepatitis C Screening  Never done   PAP SMEAR-Modifier  07/17/2022   Zoster Vaccines- Shingrix (1 of 2) 03/31/2023 (Originally 04/15/1980)   INFLUENZA VACCINE  08/10/2023 (Originally 12/11/2022)   MAMMOGRAM  03/12/2023   DTaP/Tdap/Td (2 - Td or Tdap) 04/30/2023   Colonoscopy  10/17/2023   Medicare Annual Wellness (AWV)  01/01/2024   HIV Screening  Completed   HPV VACCINES  Aged Out    Health Maintenance  Health Maintenance Due  Topic Date Due   COVID-19 Vaccine (1) Never done   Hepatitis C Screening  Never done   PAP SMEAR-Modifier  07/17/2022    Colorectal cancer screening: Type of screening: Colonoscopy. Completed  10/16/20. Repeat every 3 years  Mammogram status: Completed 03/11/30. Repeat every year    Lung Cancer Screening: (Low Dose CT Chest recommended if Age 25-80 years, 20 pack-year currently smoking OR have quit w/in 15years.) does not qualify.     Additional Screening:  Hepatitis C Screening: does qualify; Deferred  Vision Screening: Recommended annual ophthalmology exams for early detection of glaucoma and other disorders of the eye. Is the patient up to date with their annual eye exam?  Yes  Who is the provider or what is the name of the office in which the patient attends annual eye exams? Deferred If pt is not established with a provider, would they like to be referred to a provider to establish care? No .   Dental Screening: Recommended annual dental exams for proper  oral hygiene   Community Resource Referral / Chronic Care Management:  CRR required this visit?  No   CCM required this visit?  No     Plan:     I have personally reviewed and noted the following in the patient's chart:   Medical and social history Use of alcohol, tobacco or illicit drugs  Current medications and supplements including opioid prescriptions. Patient is not currently taking opioid prescriptions. Functional ability and status Nutritional status Physical activity Advanced directives List of other physicians Hospitalizations, surgeries, and ER visits in previous 12 months Vitals Screenings to include cognitive, depression, and falls Referrals and appointments  In addition, I have reviewed and discussed with patient certain preventive protocols, quality metrics, and best practice recommendations. A written personalized care plan for preventive services as well as general preventive health recommendations were provided to patient.     Tillie Rung, LPN   01/08/5620   After Visit Summary: (MyChart) Due to this being a telephonic visit, the after visit summary with patients personalized  plan was offered to patient via MyChart   Nurse Notes: Patient due Hep-C Screening

## 2023-01-01 NOTE — Patient Instructions (Addendum)
[name] , Thank you for taking time to come for your Medicare Wellness Visit. I appreciate your ongoing commitment to your health goals. Please review the following plan we discussed and let me know if I can assist you in the future.   Referrals/Orders/Follow-Ups/Clinician Recommendations:   This is a list of the screening recommended for you and due dates:  Health Maintenance  Topic Date Due   COVID-19 Vaccine (1) Never done   Hepatitis C Screening  Never done   Pap Smear  07/17/2022   Zoster (Shingles) Vaccine (1 of 2) 03/31/2023*   Flu Shot  08/10/2023*   Mammogram  03/12/2023   DTaP/Tdap/Td vaccine (2 - Td or Tdap) 04/30/2023   Colon Cancer Screening  10/17/2023   Medicare Annual Wellness Visit  01/01/2024   HIV Screening  Completed   HPV Vaccine  Aged Out  *Topic was postponed. The date shown is not the original due date.    Advanced directives: (Declined) Advance directive discussed with you today. Even though you declined this today, please call our office should you change your mind, and we can give you the proper paperwork for you to fill out.  Next Medicare Annual Wellness Visit scheduled for next year: Yes

## 2023-01-01 NOTE — Addendum Note (Signed)
Addended by: Saralyn Pilar on: 01/01/2023 10:20 AM   Modules accepted: Orders

## 2023-01-05 ENCOUNTER — Encounter (INDEPENDENT_AMBULATORY_CARE_PROVIDER_SITE_OTHER): Payer: Self-pay | Admitting: Otolaryngology

## 2023-01-05 ENCOUNTER — Ambulatory Visit (INDEPENDENT_AMBULATORY_CARE_PROVIDER_SITE_OTHER): Payer: Medicare Other | Admitting: Otolaryngology

## 2023-01-05 VITALS — BP 118/76 | HR 86 | Ht 60.0 in | Wt 203.0 lb

## 2023-01-05 DIAGNOSIS — R0981 Nasal congestion: Secondary | ICD-10-CM | POA: Diagnosis not present

## 2023-01-05 DIAGNOSIS — J392 Other diseases of pharynx: Secondary | ICD-10-CM

## 2023-01-05 DIAGNOSIS — K219 Gastro-esophageal reflux disease without esophagitis: Secondary | ICD-10-CM

## 2023-01-05 DIAGNOSIS — J342 Deviated nasal septum: Secondary | ICD-10-CM

## 2023-01-05 DIAGNOSIS — R0982 Postnasal drip: Secondary | ICD-10-CM | POA: Diagnosis not present

## 2023-01-05 DIAGNOSIS — J3089 Other allergic rhinitis: Secondary | ICD-10-CM

## 2023-01-05 DIAGNOSIS — J343 Hypertrophy of nasal turbinates: Secondary | ICD-10-CM

## 2023-01-05 DIAGNOSIS — R1310 Dysphagia, unspecified: Secondary | ICD-10-CM | POA: Diagnosis not present

## 2023-01-05 DIAGNOSIS — Z8719 Personal history of other diseases of the digestive system: Secondary | ICD-10-CM

## 2023-01-05 MED ORDER — FAMOTIDINE 20 MG PO TABS: 20.0000 mg | ORAL_TABLET | Freq: Two times a day (BID) | ORAL | 1 refills | Status: DC

## 2023-01-05 MED ORDER — FLUTICASONE PROPIONATE 50 MCG/ACT NA SUSP: 2.0000 | Freq: Every day | NASAL | 6 refills | Status: DC

## 2023-01-05 MED ORDER — DESLORATADINE 5 MG PO TABS: 5.0000 mg | ORAL_TABLET | Freq: Every day | ORAL | 3 refills | Status: DC

## 2023-01-05 NOTE — Progress Notes (Unsigned)
ENT CONSULT:  Reason for Consult: dysphagia for years   HPI: [name] is an 61 y.o. female with hx of right sided breast cancer, s/p lumpectomy and sentinel lymph node dissection f/b XRT, in remission, hx of clotting d/o on Eliquis, hx of elevated BMI > 39, hx of a large hiatal hernia repair x 2 (2022 and 2024), most recent repair by Dr Cliffton Asters 06/2022,  here for evaluation of long-standing dysphagia sx.  She reports that initially prior to her first hiatal hernia repair with Dr. Cliffton Asters she was not able to eat at all and had to vomit or regurgitate food to clear it out.  Records review revealed evidence of a large hiatal hernia with herniation of third of the stomach above the diaphragm in 2022 prior to her initial hiatal hernia surgery.  Since then she has had symptom improvement and then symptom worsening with return of dysphagia symptoms, which prompted revision of the hiatal hernia repair by Dr. Cliffton Asters performed in February 2024.  She subsequently developed trouble swallowing and feels that her issue is in the upper throat level, had esophagram done in May 2024 which showed narrowed appearance of the distal esophagus GE junction similar to prior esophagram done in February 2024 and consistent with history of fundoplication.  Report also mentions prominent CP bar and no evidence of recurrence of hiatal hernia.  She underwent Maloney dilation with Dr. Cliffton Asters in July 2024, and operative report states that she was dilated to 18 [place].  She feels that she did not have any significant improvement in her symptoms.  History of GERD currently not on any medications, feels that her initial hiatal hernia repair improved her heartburn symptoms.  Denies history of food impaction.    Records Reviewed:  Op note by Dr. Cliffton Asters 11/19/2018  11/19/2022 [name] dilator used to dilate esophagus see below   Pre-Op Dx: s/p paraesophageal hernia repair with fundoplication Dysphagia   Post-op Dx:   same Procedure: - Esophagogastroscopy - Savory Dilation up to 23F     Surgeon and Role:      * Brandi Skains, MD - Primary Anesthesia  general EBL:  none Blood Administration: none Specimen:  none     Counts: correct     Indications: 62 year old female presents today in follow-up for further discussion of her dysphagia. She describes dysphagia mostly in the cervical phase which is associated mostly with rice and pasta. She denies any reflux. Personally reviewed her most recent swallow study. Contrast as well as barium pill passed to the GE junction without much delay. She does have a prominent cricopharyngeus muscle. I gave her the option of performing the dilation, but I do not think that this will significantly improve her symptoms. She is willing to give this a try. She is scheduled for an EGD and savory dilation.    Findings: Esophagus was widely patent.  We were able to cross the GE junction without difficulty.  We dilated up to a 23F without any significant resistance.   Operative Technique: After the risks, benefits and alternatives were thoroughly discussed, the patient was brought to the operative theatre.  Anesthesia was induced. The patient was prepped and draped in normal sterile fashion.  An appropriate surgical pause was performed, and pre-operative antibiotics were dosed accordingly.   The gastroscope was advanced through the oropharynx into the cervical esophagus under direct visualization.  The scope was passed into the stomach.  The scope was then pulled back, and the esophageal mucosa was  visualized.   Next a Jag wire was passed through the gastroscope into the stomach with fluoroscopic guidance.  The esophageal dilation was performed up to a 64F dilator.  This was left in place for 10 minutes.     Once removed, we visualized the stomach and esophagus again.  There was no evidence of mucosal injury.    The patient tolerated the procedure without any immediate  complications, and was transferred to the PACU in stable condition.     Had lap chole with Dr [name] 07/29/2022  06/12/2022 - had robotic assisted EGD and paraesophageal hernia repair and gastropexy - revision of the hernia repair  Findings: Recurrence in her hiatal hernia.  It appears as though the crural stitches pulled through.  There is only stomach hiatus.  We were able to mobilize the esophagus, and perform the hiatal repair with felt pledgets.  There was no obvious mucosal injury on endoscopy at completion of the case.   Op Note by Dr [name] 09/09/22: This is a 62 year old female diagnosed with right breast invasive ductal carcinoma.  She is s/p right breast lumpectomy and sentinel lymph node biopsy by Dr. Carolynne Baker on 06/03/21.  She recently completed radiation to the right breast.  She has developed progressively worsening pain, swelling, and induration since finishing the radiation.  She was started on antibiotics earlier in the week by Dr. Carolynne Baker, but the symptoms have worsened.  She presented to the ED for evaluation.  Normal WBC, but CT scan shows a large seroma with fat stranding and skin thickening.  Based on the clinical exam, this is felt to be an infected seroma.     12/17/2020 62 year old female with a large hiatal hernia.  She also has significant reflux, and dysphagia.  Of concern is the fact that she must induce vomiting to treat her symptoms.  Cross-sectional imaging was from over 2 years ago, thus I will repeat his CT scan.  We will also take a look at this 6 mm pulmonary nodule that was noted.  We discussed the risks and benefits of upper endoscopy, robotic assisted laparoscopy with hiatal hernia repair, and fundoplication.  Her BMI is 39 thus I will use an absorbable mesh.  She is agree to proceed and is tentatively scheduled for August 8.   Findings: Large hiatal defect.  Stomach in the hernia sac.  4 stitches required to re-approximate the hernia    Past Medical History:   Diagnosis Date   Acid reflux    takes Zantac and Omeprazole daily   Anemia    Anxiety    takes Citaopram daily   Arthritis    right knee   Arthrosis    left thumb CMC   Breast cancer (HCC)    Chronic back pain    DDD   Clotting disorder (HCC)    hx of blood clot following knee scope, pt reports hx blood clot in her lung   Depression    Dyspnea on exertion    Fibromyalgia    History of bronchitis 3+yrs ago   Hyperlipidemia    takes Atorvastatin daily   Hypertension    takes Lisinopril and HCTZ daily   Insomnia    takes Elavil nightly as needed   Paraesophageal hernia    Personal history of radiation therapy     Past Surgical History:  Procedure Laterality Date   BREAST BIOPSY Left    BREAST LUMPECTOMY     BREAST LUMPECTOMY WITH RADIOACTIVE SEED AND SENTINEL LYMPH  NODE BIOPSY Right 06/03/2021   Procedure: RIGHT BREAST LUMPECTOMY WITH RADIOACTIVE SEED AND SENTINEL LYMPH NODE BIOPSY;  Surgeon: Griselda Miner, MD;  Location: [facility];  Service: General;  Laterality: Right;   BUNIONECTOMY Right 02/18/2018   Procedure: Ivory Broad;  Surgeon: Felecia Shelling, DPM;  Location: [facility];  Service: Podiatry;  Laterality: Right;   CAPSULOTOMY Bilateral 02/18/2018   Procedure: CAPSULOTOMY MPJ RELEASE JOINT 2N BILATERAL;  Surgeon: Felecia Shelling, DPM;  Location: [facility];  Service: Podiatry;  Laterality: Bilateral;   CARPOMETACARPEL SUSPENSION PLASTY Left 01/27/2018   Procedure: LEFT THUMB ligament reconstruction and tendon interposition;  Surgeon: Tarry Kos, MD;  Location: [facility];  Service: Orthopedics;  Laterality: Left;   CARPOMETACARPEL SUSPENSION PLASTY Left 03/07/2020   Procedure: REVISION LEFT THUMB CARPOMETACARPAL (CMC) ARTHROPLASTY;  Surgeon: Tarry Kos, MD;  Location: [facility];  Service: Orthopedics;  Laterality: Left;   CHOLECYSTECTOMY N/A 07/29/2022   Procedure: LAPAROSCOPIC CHOLECYSTECTOMY WITH  ICG DYE;   Surgeon: Emelia Loron, MD;  Location: [facility] OR;  Service: General;  Laterality: N/A;  RNFA  ICG DYE  TAP BLOCK   CHONDROPLASTY Right 06/28/2014   Procedure: CHONDROPLASTY;  Surgeon: Cheral Almas, MD;  Location: [facility];  Service: Orthopedics;  Laterality: Right;   COLONOSCOPY N/A 05/29/2014   Procedure: COLONOSCOPY;  Surgeon: Corbin Ade, MD;  [facility];  Service: Endoscopy;  Laterality: N/A;  215pm- Pt is working until 12:00 so she can't come any earlier   ESOPHAGOGASTRODUODENOSCOPY N/A 05/29/2014   Procedure: ESOPHAGOGASTRODUODENOSCOPY (EGD);  Surgeon: Corbin Ade, MD;  [facility];  Service: Endoscopy;  Laterality: N/A;   ESOPHAGOGASTRODUODENOSCOPY N/A 12/17/2020   Procedure: ESOPHAGOGASTRODUODENOSCOPY (EGD);  Surgeon: Brandi Skains, MD;  [facility] OR;  Service: Thoracic;  Laterality: N/A;   ESOPHAGOGASTRODUODENOSCOPY N/A 06/12/2022   Procedure: ESOPHAGOGASTRODUODENOSCOPY (EGD);  Surgeon: Brandi Skains, MD;  Location: [facility] OR;  Service: Thoracic;  Laterality: N/A;   ESOPHAGOGASTRODUODENOSCOPY N/A 11/19/2022   Procedure: ESOPHAGOGASTRODUODENOSCOPY (EGD) WITH SAVARY DILATION;  Surgeon: Brandi Skains, MD;  [facility];  Service: Thoracic;  Laterality: N/A;   GANGLION CYST EXCISION Left 01/13/2002   HAMMER TOE SURGERY Bilateral 02/18/2018   Procedure: HAMMER TOE CORRECTION2ND BILATERAL;  Surgeon: Felecia Shelling, DPM;  Location: [facility];  Service: Podiatry;  Laterality: Bilateral;   HERNIA REPAIR     IRRIGATION AND DEBRIDEMENT ABSCESS Right 09/08/2021   Procedure: IRRIGATION AND DEBRIDEMENT RIGHT BREAST ABSCESS;  Surgeon: Manus Rudd, MD;  Location: [facility];  Service: General;  Laterality: Right;   KNEE ARTHROSCOPY WITH MEDIAL MENISECTOMY Right 06/28/2014   Procedure: RIGHT KNEE ARTHROSCOPY WITH PARTIAL MEDIAL MENISCECTOMY AND CHONDROPLASTY;  Surgeon: Cheral Almas, MD;  Location: [facility];   Service: Orthopedics;  Laterality: Right;   MALONEY DILATION N/A 05/29/2014   Procedure: [name] DILATION;  Surgeon: Corbin Ade, MD;  [facility];  Service: Endoscopy;  Laterality: N/A;   PARTIAL KNEE ARTHROPLASTY Right 09/15/2014   Procedure: RIGHT UNICOMPARTMENTAL KNEE ARTHROPLASTY;  Surgeon: Tarry Kos, MD;  Location[facility];  Service: Orthopedics;  Laterality: Right;   SHOULDER ARTHROSCOPY Right    TOTAL KNEE ARTHROPLASTY Left 04/08/2004   XI ROBOTIC ASSISTED HIATAL HERNIA REPAIR N/A 06/12/2022   Procedure: XI ROBOTIC ASSISTED HIATAL HERNIA REPAIR;  Surgeon: Brandi Skains, MD;  Location: [facility];  Service: Thoracic;  Laterality: N/A;   XI ROBOTIC ASSISTED PARAESOPHAGEAL  HERNIA REPAIR N/A 12/17/2020   Procedure: XI ROBOTIC ASSISTED Laparoscopy PARAESOPHAGEAL HERNIA REPAIR WITH FUNDOPLICATION;  Surgeon: Brandi Skains, MD;  Location: [facility];  Service: Thoracic;  Laterality: N/A;    Family History  Problem Relation Age of Onset   Stroke Mother    Lung cancer Father 56   GI problems Father    Prostate cancer Father    Cancer Sister        breast   Breast cancer Sister 40   Lung cancer Maternal Grandfather    Lung cancer Paternal Grandfather    Diabetes Son    Colon cancer Neg Hx    Esophageal cancer Neg Hx    Pancreatic cancer Neg Hx     Social History:  reports that she has never smoked. She has never been exposed to tobacco smoke. She has never used smokeless tobacco. She reports that she does not drink alcohol and does not use drugs.  Allergies:  Allergies  Allergen Reactions   Tramadol Itching    "bugs crawling all over"    Medications: I have reviewed the patient's current medications.  The PMH, PSH, Medications, Allergies, and SH were reviewed and updated.  ROS: Constitutional: Negative for fever, weight loss and weight gain. Cardiovascular: Negative for chest pain and dyspnea on exertion. Respiratory: Is not experiencing shortness of  breath at rest. Gastrointestinal: Negative for nausea and vomiting. Neurological: Negative for headaches. Psychiatric: The patient is not nervous/anxious  Blood pressure 118/76, pulse 86, height 5' (1.524 m), weight 203 lb (92.1 kg), last menstrual period 02/07/2013, SpO2 99%.  PHYSICAL EXAM:  Exam: General: Well-developed, well-nourished Communication and Voice: Clear pitch and clarity Respiratory Respiratory effort: Equal inspiration and expiration without stridor Cardiovascular Peripheral Vascular: Warm extremities with equal color/perfusion Eyes: No nystagmus with equal extraocular motion bilaterally Neuro/Psych/Balance: Patient oriented to person, place, and time; Appropriate mood and affect; Gait is intact with no imbalance; Cranial nerves I-XII are intact Head and Face Inspection: Normocephalic and atraumatic without mass or lesion Palpation: Facial skeleton intact without bony stepoffs Salivary Glands: No mass or tenderness Facial Strength: Facial motility symmetric and full bilaterally ENT Pinna: External ear intact and fully developed External canal: Canal is patent with intact skin Tympanic Membrane: Clear and mobile External Nose: No scar or anatomic deformity Internal Nose: Septum is deviated with S-shaped septum. No polyp, or purulence. Mucosal edema and erythema present.  Bilateral inferior turbinate hypertrophy.  Lips, Teeth, and gums: Mucosa and teeth intact and viable TMJ: No pain to palpation with full mobility Oral cavity/oropharynx: No erythema or exudate, no lesions present Nasopharynx: No mass or lesion with intact mucosa Hypopharynx: Intact mucosa without pooling of secretions Larynx Glottic: Full true vocal cord mobility without lesion or mass Supraglottic: Normal appearing epiglottis and AE folds Interarytenoid Space: moderate pachydermia edema Subglottic Space: Patent without lesion or edema Neck Neck and Trachea: Midline trachea without mass or  lesion Thyroid: No mass or nodularity Lymphatics: No lymphadenopathy  Procedure:  Preoperative diagnosis: dysphagia   Postoperative diagnosis:   Same + GERD/LPR and nasal congestion   Procedure: Flexible fiberoptic laryngoscopy  Surgeon: Ashok Croon, MD  Anesthesia: Topical lidocaine and Afrin Complications: None Condition is stable throughout exam  Indications and consent:  The patient presents to the clinic with Indirect laryngoscopy view was incomplete. Thus it was recommended that they undergo a flexible fiberoptic laryngoscopy. All of the risks, benefits, and potential complications were reviewed with the patient preoperatively and verbal informed consent was obtained.  Procedure:  The patient was seated upright in the clinic. Topical lidocaine and Afrin were applied to the nasal cavity. After adequate anesthesia had occurred, I then proceeded to pass the flexible telescope into the nasal cavity. The nasal cavity was patent without rhinorrhea or polyp. The nasopharynx was also patent without mass or lesion. The base of tongue was visualized and was normal. There were no signs of pooling of secretions in the piriform sinuses. The true vocal folds were mobile bilaterally. There were no signs of glottic or supraglottic mucosal lesion or mass. There was moderate interarytenoid pachydermia and post cricoid edema. The telescope was then slowly withdrawn and the patient tolerated the procedure throughout.     Studies Reviewed:esophagram 10/02/22 FINDINGS: Somewhat prominent cricopharyngeus muscle impression upon the posterior aspect of the lower hypopharynx/upper cervical esophagus.   Narrowed appearance of the distal esophagus/GE junction with contour deformity of the proximal stomach. These findings are similar to the prior esophagram of 06/13/2022 and compatible with the history of prior Dor fundoplication.   Esophagus normal in caliber and smooth in contour elsewhere.    Normal esophageal motility was observed.   No evidence of recurrent hiatal hernia.   No gastroesophageal reflux observed.   The patient swallowed a 13 mm barium tablet, which freely passed into the stomach.   IMPRESSION: 1. Narrowed appearance of the distal esophagus/GE junction with contour deformity of the proximal stomach. These findings are similar to the prior esophagram of 06/13/2022 and consistent with the patient's history of prior Dor fundoplication. 2. Somewhat prominent cricopharyngeus muscle impression upon the posterior aspect of the lower hypopharynx/upper cervical esophagus. 3. Otherwise unremarkable examination. No evidence of recurrent hiatal hernia. A swallowed 13 mm barium tablet passed freely into the esophagus   Esophagram 12/18/20 FINDINGS: No definite mass or stricture is noted in the esophagus. No definite leakage or extravasation is noted. Filling of the stomach is noted. Tertiary contractions are noted in the distal esophagus suggesting presbyesophagus. No definite hernia is noted.   IMPRESSION: No definite evidence of contrast extravasation or leakage is seen involving the esophagus. 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 Heart unremarkable. No pericardial effusion.   Mediastinum/Nodes: RIGHT breast lesion 17 x 15 mm, low-attenuation question cyst. No thoracic adenopathy. Base of cervical region normal appearance. Esophagus unremarkable. Large hiatal hernia with 3/4 of stomach estimated in the inferior mediastinum; GE junction is located above the diaphragm. Overall appearance is unchanged from prior exam.   Lungs/Pleura: 7 x 5 mm anterior RIGHT upper lobe nodule image 47 unchanged. 4 mm RIGHT upper lobe nodule image 57 unchanged. Subsegmental atelectasis LEFT lower lobe. No infiltrate,  pleural effusion, or pneumothorax.   Upper Abdomen: RIGHT adrenal adenoma 3.2 x 2.5 cm image 130. Calcified granuloma lateral segment LEFT lobe liver. Remaining visualized upper abdomen unremarkable.   Musculoskeletal: No acute osseous findings.   IMPRESSION: Large hiatal hernia with estimated 3/4 of stomach within inferior mediastinum, stable.  Assessment/Plan: Encounter Diagnoses  Name Primary?   Dysphagia, unspecified type Yes   Gastroesophageal reflux disease without esophagitis    Nasal congestion    Post-nasal drainage    Environmental and seasonal allergies    Hypertrophy of both inferior nasal turbinates    Deviated nasal septum    H/O hiatal hernia    Cricopharyngeal hypertrophy    Cricopharyngeal spasm    61 yoF hx of right sided breast cancer, s/p lumpectomy and sentinel  lymph node dissection f/b XRT, in remission, hx of clotting d/o on Eliquis, hx of elevated BMI > 39, hx of a large hiatal hernia repair x 2 (2022 and 2024), most recent repair by Dr Cliffton Asters 06/2022,  here for evaluation of long-standing dysphagia sx.   Extra time (30 min) was spent reviewing and summarizing multiple records of prior surgeries with thoracic and general surgery as well as multiple imaging studies that were obtained in the process of dysphagia symptom evaluation and management.   She reports symptoms of food getting stuck at the level of her throat, and feels that it is still difficult to swallow, no food impaction no choking on liquids.  Already had esophagram which demonstrated narrowing at the site of Nissen fundoplication no hernia recurrence and prominent CP bar.  No prior modified barium swallow studies.   I personally reviewed esophagram imaging and there is a visible CP bar present on the study although barium passes through without issues.  Her exam today including flexible laryngoscopy did not reveal pooling of secretions along the piriform or postcricoid area, bilateral vocal  folds are mobile, there were no masses or lesions noted on exam.  I discussed exam findings with the patient and advised her to have modified barium swallow test to better determine the primary reason for her symptoms.  She also had significant postcricoid edema and pachydermia and we agreed to initiate management of GERD LPR.  Will start famotidine and Reflux Gourmet.  Of note she also had evidence of nasal congestion mucosal edema along nasal passages and clear secretions throughout the nasal passages and along the pharyngeal wall, consistent with environmental allergies and postnasal drainage.  Will initiate systemic antihistamine and Flonase.   I suspect her symptoms are multifactorial and related to an element of presbyesophagus prior tightening of lower esophageal sphincter during 2 Nissen fundoplication surgeries, and possible CP dysfunction versus CP spasm in the setting of untreated GERD LPR.  Of note no history of recent weight loss despite of symptoms, and no signs of severe dysphagia at CP level since her flexible laryngoscopy did not demonstrate pooling of secretions around the area of piriform sinuses and postcricoid region.   - schedule modified barium swallow  - start Famotidine and Reflux Gourmet for reflux  - start Clarinex and Flonase for suspected allergies and nasal congestion/PND - return after testing - will consider swallow therapy if warranted, will consider CP dilation if warranted in the future  - will consider swallow therapy is warranted in the future  Thank you for allowing me to participate in the care of this patient. Please do not hesitate to contact me with any questions or concerns.   Ashok Croon, MD Otolaryngology [facility] Phone: [phone] Fax: [phone]    01/06/2023, 6:08 AM

## 2023-01-05 NOTE — Patient Instructions (Addendum)
-   schedule modified barium swallow  - start Famotidine and Reflux Gourmet for reflux  - start allergy pill and do Flonase   - Take Reflux Gourmet (natural supplement available on [employer]) to help with symptoms of chronic throat irritation

## 2023-01-06 ENCOUNTER — Telehealth (HOSPITAL_COMMUNITY): Payer: Self-pay | Admitting: *Deleted

## 2023-01-06 NOTE — Telephone Encounter (Signed)
Attempted to contact patient to schedule OP MBS. Left VM. RKEEL 

## 2023-01-10 ENCOUNTER — Other Ambulatory Visit: Payer: Self-pay | Admitting: Family Medicine

## 2023-01-10 DIAGNOSIS — I2699 Other pulmonary embolism without acute cor pulmonale: Secondary | ICD-10-CM

## 2023-01-18 ENCOUNTER — Other Ambulatory Visit: Payer: Self-pay | Admitting: Family Medicine

## 2023-01-20 ENCOUNTER — Other Ambulatory Visit (HOSPITAL_COMMUNITY): Payer: Self-pay | Admitting: *Deleted

## 2023-01-20 DIAGNOSIS — R1310 Dysphagia, unspecified: Secondary | ICD-10-CM

## 2023-01-23 ENCOUNTER — Ambulatory Visit (HOSPITAL_BASED_OUTPATIENT_CLINIC_OR_DEPARTMENT_OTHER): Payer: Medicare Other | Admitting: Orthopaedic Surgery

## 2023-02-05 ENCOUNTER — Ambulatory Visit (HOSPITAL_COMMUNITY)
Admission: RE | Admit: 2023-02-05 | Discharge: 2023-02-05 | Disposition: A | Discharge status: Home / Self Care | Payer: Medicare Other | Source: Ambulatory Visit | Attending: Otolaryngology | Admitting: Otolaryngology

## 2023-02-05 DIAGNOSIS — R1310 Dysphagia, unspecified: Secondary | ICD-10-CM | POA: Diagnosis present

## 2023-02-05 DIAGNOSIS — R09A2 Foreign body sensation, throat: Secondary | ICD-10-CM | POA: Insufficient documentation

## 2023-02-05 NOTE — Progress Notes (Signed)
Modified Barium Swallow Study  Patient Details  Name: [name] MRN: [id number] Date of Birth: 1960/08/03  Today's Date: 02/05/2023  Modified Barium Swallow completed.  Full report located under Chart Review in the Imaging Section.  History of Present Illness Pt seen for ongoing c/o dysphagia, referred from Dr. Irene Pap. PMHx signficant for  large hiatal hernia repair x 2 (2022 and 2024). ENT flexible laryngoscopy did not reveal pooling of secretions along the piriform or postcricoid area, bilateral vocal folds are mobile, there were no masses or lesions noted on exam. Significant postcricoid edema and pachydermia with initiation of GERD/LPR management, observation of nasal congestion mucosal edema along nasal passages and clear secretions throughout the nasal passages and along the pharyngeal wall, consistent with environmental allergies and postnasal drainage, with new medication added to address per Dr Irene Pap.  Esophagram done in May 2024 which showed narrowed appearance of the distal esophagus GE junction similar to prior esophagram done in February 2024 and consistent with history of fundoplication.  Report also mentions prominent CP bar and no evidence of recurrence of hiatal hernia.  She underwent Maloney dilation with Dr. Cliffton Asters in July 2024, and operative report states that she was dilated to 74 [place]. She feels that she did not have any significant improvement in her symptoms.  Today, pt c/o globus sensation, sensation of phlegm and mucus in throat. Sensation reportedly increased at night when laying down. She deneis odynophagia, denies coughing or choking with liquids or solids. Endorses occasional "squeaky voice," coughing and throat clearing habitually throughout the day. OME exam unremarkable.   Clinical Impression Pt presents with functional oropharyngeal swallow, with no instances of aspiration or penetration observed across consistencies. Mild deficits noted for base of tongue  retraction and disorganized tongue motion during A-P transit of hard solid boluses. Mild oral residue seen to line tongue s/p pharyngeal swallow. Pharyngeal swallow initiates in pyriform sinuses for thin liquid boluses, valleculae for other consistencies assessed. Pill administration completed with thin liquid. Pill passes through the pharynx with no obstruction, though pt c/o globus sensation. Esophageal sweep reveals pill to briefly lodge in the distal esophagus, just above level of stomach. Statis of pill ultimately resolves without need for additional bolus trial, though pt still sensing pill in area of larynx. Pt denies globus sensation across any other bolus trial. 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 SLP provided pt with education re: deferred sensation and functional oropharyngeal swallow, with pt verbalizing understanding. Encouraged pt to follow Dr. Leighton Roach recommendations for management of GERD/LPR/post nasal drip to reduce sensation of phlegm/mucus in throat and hopefully resolve chronic throat clearing and coughing. Pt denies questions at conclusion of evaluation. Reflux recommendations provided via handout. Factors that may increase risk of adverse event in presence of aspiration Rubye Oaks & Clearance Coots 2021):  n/a  Swallow Evaluation Recommendations Recommendations: PO diet PO Diet Recommendation: Regular;Thin liquids (Level 0) Liquid Administration via: Cup;Straw Medication Administration: Whole meds with liquid Supervision: Patient able to self-feed Postural changes: Stay upright 30-60 min after meals Oral care recommendations: Oral care BID (2x/day)      Maia Breslow 02/05/2023,3:01 PM

## 2023-02-09 ENCOUNTER — Other Ambulatory Visit: Payer: Self-pay | Admitting: Family Medicine

## 2023-02-09 DIAGNOSIS — I2699 Other pulmonary embolism without acute cor pulmonale: Secondary | ICD-10-CM

## 2023-02-11 ENCOUNTER — Other Ambulatory Visit: Payer: Self-pay | Admitting: Family Medicine

## 2023-02-16 ENCOUNTER — Other Ambulatory Visit: Payer: Self-pay | Admitting: Adult Health

## 2023-02-16 DIAGNOSIS — Z9889 Other specified postprocedural states: Secondary | ICD-10-CM

## 2023-03-02 ENCOUNTER — Ambulatory Visit: Payer: Medicare Other | Attending: Family Medicine

## 2023-03-02 ENCOUNTER — Encounter: Payer: Self-pay | Admitting: Family Medicine

## 2023-03-02 ENCOUNTER — Telehealth (INDEPENDENT_AMBULATORY_CARE_PROVIDER_SITE_OTHER): Payer: Medicare Other | Admitting: Family Medicine

## 2023-03-02 VITALS — Wt 190.0 lb

## 2023-03-02 DIAGNOSIS — F32A Depression, unspecified: Secondary | ICD-10-CM

## 2023-03-02 DIAGNOSIS — R002 Palpitations: Secondary | ICD-10-CM | POA: Diagnosis not present

## 2023-03-02 DIAGNOSIS — K219 Gastro-esophageal reflux disease without esophagitis: Secondary | ICD-10-CM | POA: Diagnosis not present

## 2023-03-02 DIAGNOSIS — R110 Nausea: Secondary | ICD-10-CM | POA: Insufficient documentation

## 2023-03-02 DIAGNOSIS — R0609 Other forms of dyspnea: Secondary | ICD-10-CM

## 2023-03-02 DIAGNOSIS — Z1159 Encounter for screening for other viral diseases: Secondary | ICD-10-CM

## 2023-03-02 DIAGNOSIS — R6881 Early satiety: Secondary | ICD-10-CM

## 2023-03-02 MED ORDER — BUPROPION HCL ER (SR) 150 MG PO TB12
150.0000 mg | ORAL_TABLET | Freq: Every morning | ORAL | 1 refills | Status: DC
Start: 2023-03-02 — End: 2023-09-18

## 2023-03-02 MED ORDER — MECLIZINE HCL 25 MG PO TABS
25.0000 mg | ORAL_TABLET | Freq: Three times a day (TID) | ORAL | 1 refills | Status: DC | PRN
Start: 2023-03-02 — End: 2023-06-11

## 2023-03-02 MED ORDER — FAMOTIDINE 20 MG PO TABS: 20.0000 mg | ORAL_TABLET | Freq: Every day | ORAL | 1 refills | Status: DC

## 2023-03-02 MED ORDER — FLUOXETINE HCL 20 MG PO TABS: 40.0000 mg | ORAL_TABLET | Freq: Every day | ORAL | 0 refills | Status: DC

## 2023-03-02 NOTE — Assessment & Plan Note (Signed)
Restart famotidine 20 mg daily.

## 2023-03-02 NOTE — Progress Notes (Signed)
Virtual Visit via Video Note  I connected with [name] on 03/02/23 at  2:50 PM EDT by a video enabled telemedicine application and verified that I am speaking with the correct person using two identifiers.  Patient Location: Home Provider Location: Office/clinic  I discussed the limitations, risks, security, and privacy concerns of performing an evaluation and management service by video and the availability of in person appointments. I also discussed with the patient that there may be a patient responsible charge related to this service. The patient expressed understanding and agreed to proceed.    Subjective   Patient ID: [name], female    DOB: 22-Mar-1961  Age: 62 y.o. MRN: [id number]  Chief Complaint  Patient presents with   Anxiety   Depression   Nausea    When riding in cars.     HPI [name] is a 63 y.o. female presenting today for follow up of mood.  She also endorses a problem with nausea and vomiting every time that she rides in a car.  She states that this has been going on for many months now.  It happened occasionally before her hiatal hernia surgery to 124, but over the past few months has become more frequent and more intense.  Evaluation by GI and ENT with scopes did not reveal any esophageal abnormality.  Associated symptoms include occasional dizziness and hand tremors.  She notes a decreased appetite, early satiety, and weight loss of almost 30 pounds over the past 4 months.  She denies abdominal pain, fever, chills.  She endorses occasional palpitations and dyspnea on exertion, denies chest pain. Mood: Patient is here to follow up for depression and anxiety, currently managing with Prozac 40 mg daily and bupropion 150 mg as added at last appointment. Taking medication without side effects, reports excellent compliance with treatment. Denies mood changes or SI/HI. She feels mood is slightly improved since last visit.  She has been struggling to fall asleep for  quite some time which remains unchanged since adding Wellbutrin.  She does note that she has been taking Wellbutrin in the evening.  It has been helpful for giving her more motivation, and she has started cross stitching again.    03/02/2023    2:46 PM 01/01/2023    9:49 AM 12/29/2022    1:10 PM  Depression screen PHQ 2/9  Decreased Interest 3 0 3  Down, Depressed, Hopeless 3 0 3  PHQ - 2 Score 6 0 6  Altered sleeping 3 0 3  Tired, decreased energy 3 0 3  Change in appetite 3 0 3  Feeling bad or failure about yourself  1 0 3  Trouble concentrating 0 0 3  Moving slowly or fidgety/restless 0 0 0  Suicidal thoughts 0 0 0  PHQ-9 Score 16 0 21  Difficult doing work/chores Somewhat difficult Not difficult at all Somewhat difficult       03/02/2023    2:49 PM 12/29/2022    1:10 PM 06/23/2022    1:16 PM 02/20/2022    3:38 PM  GAD 7 : Generalized Anxiety Score  Nervous, Anxious, on Edge 0 3 2 2   Control/stop worrying 3 3 2 2   Worry too much - different things 3 3 2 2   Trouble relaxing 0 3 2 0  Restless 0 0 1 0  Easily annoyed or irritable 0 0 0 0  Afraid - awful might happen 3 3 2 1   Total GAD 7 Score 9 15  11 7  Anxiety Difficulty Somewhat difficult Somewhat difficult     Review of Systems  Constitutional:  Positive for weight loss.  Respiratory:  Positive for shortness of breath (DOE).   Cardiovascular:  Positive for palpitations.  Gastrointestinal:  Positive for nausea and vomiting.       Early satiety  Neurological:  Positive for dizziness.  Otherwise negative unless noted (see HPI)  Outpatient Medications Prior to Visit  Medication Sig   amLODipine (NORVASC) 5 MG tablet Take 1 tablet (5 mg total) by mouth daily.   cloNIDine (CATAPRES) 0.1 MG tablet Take 1 tablet (0.1 mg total) by mouth 2 (two) times daily.   desloratadine (CLARINEX) 5 MG tablet Take 1 tablet (5 mg total) by mouth daily.   ELIQUIS 5 MG TABS tablet TAKE 1 TABLET BY MOUTH TWICE DAILY.  CRUSH TABLET BEFORE  TAKING   fluticasone (FLONASE) 50 MCG/ACT nasal spray Place 2 sprays into both nostrils daily.   hydrochlorothiazide (HYDRODIURIL) 50 MG tablet Take 1 tablet (50 mg total) by mouth daily. Crush tablet before taking   metoprolol succinate (TOPROL-XL) 25 MG 24 hr tablet Take 1 tablet (25 mg total) by mouth daily. Split tablet in 1/2 prior to taking.   VENTOLIN HFA 108 (90 Base) MCG/ACT inhaler INHALE 2 PUFFS BY MOUTH EVERY 6 HOURS AS NEEDED FOR WHEEZING OR SHORTNESS OF BREATH   Vitamin D, Ergocalciferol, (DRISDOL) 1.25 MG (50000 UNIT) CAPS capsule Take 1 capsule (50,000 Units total) by mouth every 7 (seven) days.   [DISCONTINUED] buPROPion (WELLBUTRIN SR) 150 MG 12 hr tablet Take 1 tablet (150 mg total) by mouth 2 (two) times daily.   [DISCONTINUED] famotidine (PEPCID) 20 MG tablet Take 1 tablet (20 mg total) by mouth 2 (two) times daily.   [DISCONTINUED] FLUoxetine (PROZAC) 20 MG tablet Take 2 tablets (40 mg total) by mouth daily. Crush tablets before taking.   No facility-administered medications prior to visit.     Objective:     Physical Exam General: Speaking clearly in complete sentences without any shortness of breath.  Alert and oriented x3.  Normal judgment. No apparent acute distress.   Assessment & Plan:  Depression, unspecified depression type Assessment & Plan: PHQ-9 score 16, GAD-7 score 9.  Small improvement from 12/29/2022.  Continue Prozac 40 mg daily, first try moving Wellbutrin 150 mg dose to morning instead of the evening.  Follow-up in 4 weeks for nausea as well as to assess if increasing Prozac to 60 mg daily is necessary.  Orders: -     FLUoxetine HCl; Take 2 tablets (40 mg total) by mouth daily. Crush tablets before taking.  Dispense: 180 tablet; Refill: 0 -     buPROPion HCl ER (SR); Take 1 tablet (150 mg total) by mouth in the morning.  Dispense: 90 tablet; Refill: 1  Nausea Assessment & Plan: Starting workup with fasting labs including lipase and CA125.  Patient  has a history of breast cancer and reports early satiety.  Also initiating trial of meclizine 25 mg before riding in a car.    Orders: -     Meclizine HCl; Take 1 tablet (25 mg total) by mouth 3 (three) times daily as needed for nausea (take 30 minutes before riding in car).  Dispense: 30 tablet; Refill: 1 -     CBC with Differential/Platelet; Future -     Comprehensive metabolic panel; Future -     Hemoglobin A1c; Future -     Lipid panel; Future -  Lipase; Future -     TSH Rfx on Abnormal to Free T4; Future  Gastroesophageal reflux disease without esophagitis Assessment & Plan: Restart famotidine 20 mg daily.  Orders: -     Famotidine; Take 1 tablet (20 mg total) by mouth daily.  Dispense: 30 tablet; Refill: 1  Palpitations Assessment & Plan: ZIO monitor ordered for home, also conducting echocardiogram due to history of palpitations and new dyspnea on exertion.  Follow-up in person in 1 month, if worsening symptoms go to the emergency department.  Orders: -     LONG TERM MONITOR (3-14 DAYS); Future -     ECHOCARDIOGRAM COMPLETE; Future -     CBC with Differential/Platelet; Future -     Comprehensive metabolic panel; Future -     TSH Rfx on Abnormal to Free T4; Future  DOE (dyspnea on exertion) -     LONG TERM MONITOR (3-14 DAYS); Future -     ECHOCARDIOGRAM COMPLETE; Future  Early satiety -     CA 125; Future  Screening for viral disease -     Hepatitis C antibody; Future    Return in about 1 week (around 03/09/2023) for fasting blood work; follow up for nausea in 1 month.   I discussed the assessment and treatment plan with the patient. The patient was provided an opportunity to ask questions, and all were answered. The patient agreed with the plan and demonstrated an understanding of the instructions.   The patient was advised to call back or seek an in-person evaluation if the symptoms worsen or if the condition fails to improve as anticipated.  The above  assessment and management plan was discussed with the patient. The patient verbalized understanding of and has agreed to the management plan.   Melida Quitter, PA

## 2023-03-02 NOTE — Progress Notes (Unsigned)
EP to read

## 2023-03-02 NOTE — Assessment & Plan Note (Signed)
ZIO monitor ordered for home, also conducting echocardiogram due to history of palpitations and new dyspnea on exertion.  Follow-up in person in 1 month, if worsening symptoms go to the emergency department.

## 2023-03-02 NOTE — Assessment & Plan Note (Addendum)
Starting workup with fasting labs including lipase and CA125.  Patient has a history of breast cancer and reports early satiety.  Also initiating trial of meclizine 25 mg before riding in a car.

## 2023-03-02 NOTE — Assessment & Plan Note (Signed)
PHQ-9 score 16, GAD-7 score 9.  Small improvement from 12/29/2022.  Continue Prozac 40 mg daily, first try moving Wellbutrin 150 mg dose to morning instead of the evening.  Follow-up in 4 weeks for nausea as well as to assess if increasing Prozac to 60 mg daily is necessary.

## 2023-03-04 ENCOUNTER — Ambulatory Visit (INDEPENDENT_AMBULATORY_CARE_PROVIDER_SITE_OTHER): Payer: Medicare Other | Admitting: Otolaryngology

## 2023-03-06 ENCOUNTER — Other Ambulatory Visit (HOSPITAL_COMMUNITY): Payer: Self-pay

## 2023-03-06 ENCOUNTER — Ambulatory Visit (INDEPENDENT_AMBULATORY_CARE_PROVIDER_SITE_OTHER): Payer: Medicare Other | Admitting: Otolaryngology

## 2023-03-08 ENCOUNTER — Other Ambulatory Visit: Payer: Self-pay | Admitting: Family Medicine

## 2023-03-08 DIAGNOSIS — I2699 Other pulmonary embolism without acute cor pulmonale: Secondary | ICD-10-CM

## 2023-03-09 DIAGNOSIS — R0609 Other forms of dyspnea: Secondary | ICD-10-CM | POA: Diagnosis not present

## 2023-03-09 DIAGNOSIS — R002 Palpitations: Secondary | ICD-10-CM | POA: Diagnosis not present

## 2023-03-19 ENCOUNTER — Other Ambulatory Visit: Payer: Medicare Other

## 2023-03-19 ENCOUNTER — Ambulatory Visit (INDEPENDENT_AMBULATORY_CARE_PROVIDER_SITE_OTHER): Payer: Medicare Other | Admitting: Otolaryngology

## 2023-03-19 ENCOUNTER — Encounter (INDEPENDENT_AMBULATORY_CARE_PROVIDER_SITE_OTHER): Payer: Self-pay | Admitting: Otolaryngology

## 2023-03-19 VITALS — BP 102/69 | HR 89

## 2023-03-19 DIAGNOSIS — J342 Deviated nasal septum: Secondary | ICD-10-CM

## 2023-03-19 DIAGNOSIS — R0982 Postnasal drip: Secondary | ICD-10-CM | POA: Diagnosis not present

## 2023-03-19 DIAGNOSIS — R1319 Other dysphagia: Secondary | ICD-10-CM

## 2023-03-19 DIAGNOSIS — Z8719 Personal history of other diseases of the digestive system: Secondary | ICD-10-CM

## 2023-03-19 DIAGNOSIS — R0981 Nasal congestion: Secondary | ICD-10-CM | POA: Diagnosis not present

## 2023-03-19 DIAGNOSIS — R002 Palpitations: Secondary | ICD-10-CM

## 2023-03-19 DIAGNOSIS — J3089 Other allergic rhinitis: Secondary | ICD-10-CM

## 2023-03-19 DIAGNOSIS — K219 Gastro-esophageal reflux disease without esophagitis: Secondary | ICD-10-CM | POA: Diagnosis not present

## 2023-03-19 DIAGNOSIS — R6881 Early satiety: Secondary | ICD-10-CM

## 2023-03-19 DIAGNOSIS — R110 Nausea: Secondary | ICD-10-CM

## 2023-03-19 DIAGNOSIS — J343 Hypertrophy of nasal turbinates: Secondary | ICD-10-CM

## 2023-03-19 DIAGNOSIS — J392 Other diseases of pharynx: Secondary | ICD-10-CM

## 2023-03-19 DIAGNOSIS — Z1159 Encounter for screening for other viral diseases: Secondary | ICD-10-CM

## 2023-03-19 MED ORDER — FAMOTIDINE 20 MG PO TABS: 20.0000 mg | ORAL_TABLET | Freq: Two times a day (BID) | ORAL | 1 refills | Status: DC

## 2023-03-19 NOTE — Progress Notes (Signed)
ENT Progress Note  Update 03/19/23: She returns after MBS. She is on Famotidine but was not able to try reflux gourmet. Started to have motion sickness and sometimes gets dry heaves when not in the car. Her PCP started her on Meclizine. Here to discuss results  Initial Evaluation 01/05/23  Reason for Consult: dysphagia for years   HPI: [name] is an 62 y.o. female with hx of right sided breast cancer, s/p lumpectomy and sentinel lymph node dissection f/b XRT, in remission, hx of clotting d/o on Eliquis, hx of elevated BMI > 39, hx of a large hiatal hernia repair x 2 (2022 and 2024), most recent repair by Dr [name] 06/2022,  here for evaluation of long-standing dysphagia sx.  She reports that initially prior to her first hiatal hernia repair with Dr. Cliffton Baker she was not able to eat at all and had to vomit or regurgitate food to clear it out.  Records review revealed evidence of a large hiatal hernia with herniation of third of the stomach above the diaphragm in 2022 prior to her initial hiatal hernia surgery.  Since then she has had symptom improvement and then symptom worsening with return of dysphagia symptoms, which prompted revision of the hiatal hernia repair by Dr. Cliffton Baker performed in February 2024.  She subsequently developed trouble swallowing and feels that her issue is in the upper throat level, had esophagram done in May 2024 which showed narrowed appearance of the distal esophagus GE junction similar to prior esophagram done in February 2024 and consistent with history of fundoplication.  Report also mentions prominent CP bar and no evidence of recurrence of hiatal hernia.  She underwent Maloney dilation with Dr. Cliffton Baker in July 2024, and operative report states that she was dilated to 29 [place].  She feels that she did not have any significant improvement in her symptoms.  History of GERD currently not on any medications, feels that her initial hiatal hernia repair improved her  heartburn symptoms.  Denies history of food impaction.    Records Reviewed:  Op note by Dr. Cliffton Baker 11/19/2018  11/19/2022 [name] dilator used to dilate esophagus see below   Pre-Op Dx: s/p paraesophageal hernia repair with fundoplication Dysphagia   Post-op Dx:  same Procedure: - Esophagogastroscopy - Savory Dilation up to 25F     Surgeon and Role:      * Corliss Skains, MD - Primary Anesthesia  general EBL:  none Blood Administration: none Specimen:  none     Counts: correct     Indications: 62 year old female presents today in follow-up for further discussion of her dysphagia. She describes dysphagia mostly in the cervical phase which is associated mostly with rice and pasta. She denies any reflux. Personally reviewed her most recent swallow study. Contrast as well as barium pill passed to the GE junction without much delay. She does have a prominent cricopharyngeus muscle. I gave her the option of performing the dilation, but I do not think that this will significantly improve her symptoms. She is willing to give this a try. She is scheduled for an EGD and savory dilation.    Findings: Esophagus was widely patent.  We were able to cross the GE junction without difficulty.  We dilated up to a 25F without any significant resistance.   Operative Technique: After the risks, benefits and alternatives were thoroughly discussed, the patient was brought to the operative theatre.  Anesthesia was induced. The patient was prepped and draped in normal sterile fashion.  An appropriate surgical pause was performed, and pre-operative antibiotics were dosed accordingly.   The gastroscope was advanced through the oropharynx into the cervical esophagus under direct visualization.  The scope was passed into the stomach.  The scope was then pulled back, and the esophageal mucosa was visualized.   Next a Jag wire was passed through the gastroscope into the stomach with fluoroscopic  guidance.  The esophageal dilation was performed up to a 56F dilator.  This was left in place for 10 minutes.     Once removed, we visualized the stomach and esophagus again.  There was no evidence of mucosal injury.    The patient tolerated the procedure without any immediate complications, and was transferred to the PACU in stable condition.     Had lap chole with Dr Dwain Sarna 07/29/2022  06/12/2022 - had robotic assisted EGD and paraesophageal hernia repair and gastropexy - revision of the hernia repair  Findings: Recurrence in her hiatal hernia.  It appears as though the crural stitches pulled through.  There is only stomach hiatus.  We were able to mobilize the esophagus, and perform the hiatal repair with felt pledgets.  There was no obvious mucosal injury on endoscopy at completion of the case.   Op Note by Dr Corliss Skains 09/09/22: This is a 62 year old female diagnosed with right breast invasive ductal carcinoma.  She is s/p right breast lumpectomy and sentinel lymph node biopsy by Dr. Carolynne Baker on 06/03/21.  She recently completed radiation to the right breast.  She has developed progressively worsening pain, swelling, and induration since finishing the radiation.  She was started on antibiotics earlier in the week by Dr. Carolynne Baker, but the symptoms have worsened.  She presented to the ED for evaluation.  Normal WBC, but CT scan shows a large seroma with fat stranding and skin thickening.  Based on the clinical exam, this is felt to be an infected seroma.     12/17/2020 62 year old female with a large hiatal hernia.  She also has significant reflux, and dysphagia.  Of concern is the fact that she must induce vomiting to treat her symptoms.  Cross-sectional imaging was from over 2 years ago, thus I will repeat his CT scan.  We will also take a look at this 6 mm pulmonary nodule that was noted.  We discussed the risks and benefits of upper endoscopy, robotic assisted laparoscopy with hiatal hernia repair, and  fundoplication.  Her BMI is 39 thus I will use an absorbable mesh.  She is agree to proceed and is tentatively scheduled for August 8.   Findings: Large hiatal defect.  Stomach in the hernia sac.  4 stitches required to re-approximate the hernia    Past Medical History:  Diagnosis Date   Acid reflux    takes Zantac and Omeprazole daily   Anemia    Anxiety    takes Citaopram daily   Arthritis    right knee   Arthrosis    left thumb CMC   Breast cancer (HCC)    Chronic back pain    DDD   Clotting disorder (HCC)    hx of blood clot following knee scope, pt reports hx blood clot in her lung   Depression    Dyspnea on exertion    Fibromyalgia    History of bronchitis 3+yrs ago   Hyperlipidemia    takes Atorvastatin daily   Hypertension    takes Lisinopril and HCTZ daily   Insomnia    takes Elavil  nightly as needed   Paraesophageal hernia    Personal history of radiation therapy     Past Surgical History:  Procedure Laterality Date   BREAST BIOPSY Left    BREAST LUMPECTOMY     BREAST LUMPECTOMY WITH RADIOACTIVE SEED AND SENTINEL LYMPH NODE BIOPSY Right 06/03/2021   Procedure: RIGHT BREAST LUMPECTOMY WITH RADIOACTIVE SEED AND SENTINEL LYMPH NODE BIOPSY;  Surgeon: Griselda Miner, MD;  Location: [facility];  Service: General;  Laterality: Right;   BUNIONECTOMY Right 02/18/2018   Procedure: Ivory Broad;  Surgeon: Felecia Shelling, DPM;  Location: [facility];  Service: Podiatry;  Laterality: Right;   CAPSULOTOMY Bilateral 02/18/2018   Procedure: CAPSULOTOMY MPJ RELEASE JOINT 2N BILATERAL;  Surgeon: Felecia Shelling, DPM;  Location: [facility];  Service: Podiatry;  Laterality: Bilateral;   CARPOMETACARPEL SUSPENSION PLASTY Left 01/27/2018   Procedure: LEFT THUMB ligament reconstruction and tendon interposition;  Surgeon: Tarry Kos, MD;  Location: [facility];  Service: Orthopedics;  Laterality: Left;   CARPOMETACARPEL SUSPENSION PLASTY Left 03/07/2020    Procedure: REVISION LEFT THUMB CARPOMETACARPAL (CMC) ARTHROPLASTY;  Surgeon: Tarry Kos, MD;  Location: [facility];  Service: Orthopedics;  Laterality: Left;   CHOLECYSTECTOMY N/A 07/29/2022   Procedure: LAPAROSCOPIC CHOLECYSTECTOMY WITH  ICG DYE;  Surgeon: Emelia Loron, MD;  Location: [facility] OR;  Service: General;  Laterality: N/A;  RNFA  ICG DYE  TAP BLOCK   CHONDROPLASTY Right 06/28/2014   Procedure: CHONDROPLASTY;  Surgeon: Cheral Almas, MD;  Location: [facility];  Service: Orthopedics;  Laterality: Right;   COLONOSCOPY N/A 05/29/2014   Procedure: COLONOSCOPY;  Surgeon: Corbin Ade, MD;  Location: [facility];  Service: Endoscopy;  Laterality: N/A;  215pm- Pt is working until 12:00 so she can't come any earlier   ESOPHAGOGASTRODUODENOSCOPY N/A 05/29/2014   Procedure: ESOPHAGOGASTRODUODENOSCOPY (EGD);  Surgeon: Corbin Ade, MD;  [facility]: [facility];  Service: Endoscopy;  Laterality: N/A;   ESOPHAGOGASTRODUODENOSCOPY N/A 12/17/2020   Procedure: ESOPHAGOGASTRODUODENOSCOPY (EGD);  Surgeon: Corliss Skains, MD;  [facility];  Service: Thoracic;  Laterality: N/A;   ESOPHAGOGASTRODUODENOSCOPY N/A 06/12/2022   Procedure: ESOPHAGOGASTRODUODENOSCOPY (EGD);  Surgeon: Corliss Skains, MD;  Location: [facility] OR;  Service: Thoracic;  Laterality: N/A;   ESOPHAGOGASTRODUODENOSCOPY N/A 11/19/2022   Procedure: ESOPHAGOGASTRODUODENOSCOPY (EGD) WITH SAVARY DILATION;  Surgeon: Corliss Skains, MD;  Location: [facility];  Service: Thoracic;  Laterality: N/A;   GANGLION CYST EXCISION Left 01/13/2002   HAMMER TOE SURGERY Bilateral 02/18/2018   Procedure: HAMMER TOE CORRECTION2ND BILATERAL;  Surgeon: Felecia Shelling, DPM;  Location: [facility];  Service: Podiatry;  Laterality: Bilateral;   HERNIA REPAIR     IRRIGATION AND DEBRIDEMENT ABSCESS Right 09/08/2021   Procedure: IRRIGATION AND DEBRIDEMENT RIGHT BREAST ABSCESS;  Surgeon: Manus Rudd, MD;  Location:  [facility];  Service: General;  Laterality: Right;   KNEE ARTHROSCOPY WITH MEDIAL MENISECTOMY Right 06/28/2014   Procedure: RIGHT KNEE ARTHROSCOPY WITH PARTIAL MEDIAL MENISCECTOMY AND CHONDROPLASTY;  Surgeon: Cheral Almas, MD;  Location: [facility];  Service: Orthopedics;  Laterality: Right;   MALONEY DILATION N/A 05/29/2014   Procedure: [name] DILATION;  Surgeon: Corbin Ade, MD;  [facility];  Service: Endoscopy;  Laterality: N/A;   PARTIAL KNEE ARTHROPLASTY Right 09/15/2014   Procedure: RIGHT UNICOMPARTMENTAL KNEE ARTHROPLASTY;  Surgeon: Tarry Kos, MD;  Location: [facility];  Service: Orthopedics;  Laterality: Right;   SHOULDER ARTHROSCOPY Right  TOTAL KNEE ARTHROPLASTY Left 04/08/2004   XI ROBOTIC ASSISTED HIATAL HERNIA REPAIR N/A 06/12/2022   Procedure: XI ROBOTIC ASSISTED HIATAL HERNIA REPAIR;  Surgeon: Corliss Skains, MD;  Location: [facility];  Service: Thoracic;  Laterality: N/A;   XI ROBOTIC ASSISTED PARAESOPHAGEAL HERNIA REPAIR N/A 12/17/2020   Procedure: XI ROBOTIC ASSISTED Laparoscopy PARAESOPHAGEAL HERNIA REPAIR WITH FUNDOPLICATION;  Surgeon: Corliss Skains, MD;  Location: [facility];  Service: Thoracic;  Laterality: N/A;    Family History  Problem Relation Age of Onset   Stroke Mother    Lung cancer Father 42   GI problems Father    Prostate cancer Father    Cancer Sister        breast   Breast cancer Sister 26   Lung cancer Maternal Grandfather    Lung cancer Paternal Grandfather    Diabetes Son    Colon cancer Neg Hx    Esophageal cancer Neg Hx    Pancreatic cancer Neg Hx     Social History:  reports that she has never smoked. She has never been exposed to tobacco smoke. She has never used smokeless tobacco. She reports that she does not drink alcohol and does not use drugs.  Allergies:  Allergies  Allergen Reactions   Tramadol Itching    "bugs crawling all over"    Medications: I have reviewed the patient's current  medications.  The PMH, PSH, Medications, Allergies, and SH were reviewed and updated.  ROS: Constitutional: Negative for fever, weight loss and weight gain. Cardiovascular: Negative for chest pain and dyspnea on exertion. Respiratory: Is not experiencing shortness of breath at rest. Gastrointestinal: Negative for nausea and vomiting. Neurological: Negative for headaches. Psychiatric: The patient is not nervous/anxious  Blood pressure 102/69, pulse 89, last menstrual period 02/07/2013, SpO2 97%.  PHYSICAL EXAM:  Exam: General: Well-developed, well-nourished Respiratory Respiratory effort: Equal inspiration and expiration without stridor Cardiovascular Peripheral Vascular: Warm extremities with equal color/perfusion Eyes: No nystagmus with equal extraocular motion bilaterally Neuro/Psych/Balance: Patient oriented to person, place, and time; Appropriate mood and affect; Gait is intact with no imbalance; Cranial nerves I-XII are intact Head and Face Inspection: Normocephalic and atraumatic without mass or lesion Palpation: Facial skeleton intact without bony stepoffs Salivary Glands: No mass or tenderness Facial Strength: Facial motility symmetric and full bilaterally ENT Pinna: External ear intact and fully developed External canal: Canal is patent with intact skin Tympanic Membrane: Clear and mobile External Nose: No scar or anatomic deformity Lips, Teeth, and gums: Mucosa and teeth intact and viable Oral cavity/oropharynx: No erythema or exudate, no lesions present  Procedure: none  Studies Reviewed:esophagram 10/02/22 FINDINGS: Somewhat prominent cricopharyngeus muscle impression upon the posterior aspect of the lower hypopharynx/upper cervical esophagus.   Narrowed appearance of the distal esophagus/GE junction with contour deformity of the proximal stomach. These findings are similar to the prior esophagram of 06/13/2022 and compatible with the history of prior   fundoplication.   Esophagus normal in caliber and smooth in contour elsewhere.   Normal esophageal motility was observed.   No evidence of recurrent hiatal hernia.   No gastroesophageal reflux observed.   The patient swallowed a 13 mm barium tablet, which freely passed into the stomach.   IMPRESSION: 1. Narrowed appearance of the distal esophagus/GE junction with contour deformity of the proximal stomach. These findings are similar to the prior esophagram of 06/13/2022 and consistent with the patient's history of prior Dor fundoplication. 2. Somewhat prominent cricopharyngeus muscle impression upon the posterior aspect of the  lower hypopharynx/upper cervical esophagus. 3. Otherwise unremarkable examination. No evidence of recurrent hiatal hernia. A swallowed 13 mm barium tablet passed freely into the esophagus   Esophagram 12/18/20 FINDINGS: No definite mass or stricture is noted in the esophagus. No definite leakage or extravasation is noted. Filling of the stomach is noted. Tertiary contractions are noted in the distal esophagus suggesting presbyesophagus. No definite hernia is noted.   IMPRESSION: No definite evidence of contrast extravasation or leakage is seen involving the esophagus. 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 Heart unremarkable. No pericardial effusion.   Mediastinum/Nodes: RIGHT breast lesion 17 x 15 mm, low-attenuation question cyst. No thoracic adenopathy. Base of cervical region normal appearance. Esophagus unremarkable. Large hiatal hernia with 3/4 of stomach estimated in the inferior mediastinum; GE junction is located above the diaphragm. Overall appearance is unchanged from prior exam.   Lungs/Pleura: 7 x 5 mm anterior RIGHT upper lobe nodule image 47 unchanged. 4 mm RIGHT upper lobe nodule  image 57 unchanged. Subsegmental atelectasis LEFT lower lobe. No infiltrate, pleural effusion, or pneumothorax.   Upper Abdomen: RIGHT adrenal adenoma 3.2 x 2.5 cm image 130. Calcified granuloma lateral segment LEFT lobe liver. Remaining visualized upper abdomen unremarkable.   Musculoskeletal: No acute osseous findings.   IMPRESSION: Large hiatal hernia with estimated 3/4 of stomach within inferior mediastinum, stable.  MBS 02/05/23 HPI: Pt seen for ongoing c/o dysphagia, referred from Dr. Irene Pap. PMHx signficant for large hiatal hernia repair x 2 (2022 and 2024). ENT flexible laryngoscopy did not reveal pooling of secretions along the piriform or postcricoid area, bilateral vocal folds are mobile, there were no masses or lesions noted on exam. Significant postcricoid edema and pachydermia with initiation of GERD/LPR management, observation of nasal congestion mucosal edema along nasal passages and clear secretions throughout the nasal passages and along the pharyngeal wall, consistent with environmental allergies and postnasal drainage, with new medication added to address per Dr Irene Pap. Esophagram done in May 2024 which showed narrowed appearance of the distal esophagus GE junction similar to prior esophagram done in February 2024 and consistent with history of fundoplication. Report also mentions prominent CP bar and no evidence of recurrence of hiatal hernia. She underwent Maloney dilation with Dr. Cliffton Baker in July 2024, and operative report states that she was dilated to 50 [place]. She feels that she did not have any significant improvement in her symptoms. Today, pt c/o globus sensation, sensation of phlegm and mucus in throat. Sensation reportedly increased at night when laying down. She deneis odynophagia, denies coughing or choking with liquids or solids. Endorses occasional "squeaky voice," coughing and throat clearing habitually throughout the day. OME exam unremarkable.   Clinical  Impression: Pt presents with functional oropharyngeal swallow, with no instances of aspiration or penetration observed across consistencies. Mild deficits noted for base of tongue retraction and disorganized tongue motion during A-P transit of hard solid boluses. Mild oral residue seen to line tongue s/p pharyngeal swallow. Pharyngeal swallow initiates in pyriform sinuses for thin liquid boluses, valleculae for other consistencies assessed. Pill administration completed with thin liquid. Pill passes through the pharynx with no obstruction, though pt c/o globus sensation. Esophageal sweep reveals pill to briefly lodge in the distal esophagus, just above level of stomach. Statis of pill ultimately resolves without need for additional bolus trial, though pt still sensing pill in area of larynx. Pt denies globus sensation across any other bolus trial. Suspected  cricopharyngeal bar observed during MBSS, does not appear to impede bolus flow, with entirety of swallowed bolus moving into esophagus. SLP provided pt with education re: deferred sensation and functional oropharyngeal swallow, with pt verbalizing understanding. Encouraged pt to follow Dr. Leighton Roach recommendations for management of GERD/LPR/post nasal drip to reduce sensation of phlegm/mucus in throat and hopefully resolve chronic throat clearing and coughing. Pt denies questions at conclusion of evaluation. Reflux recommendations provided via handout.   Assessment/Plan: Encounter Diagnoses  Name Primary?   Gastroesophageal reflux disease without esophagitis    Esophageal dysphagia Yes   Chronic GERD    Deviated nasal septum    Hypertrophy of both inferior nasal turbinates    Environmental and seasonal allergies    Post-nasal drainage    H/O hiatal hernia    Cricopharyngeal spasm     61 yoF hx of right sided breast cancer, s/p lumpectomy and sentinel lymph node dissection f/b XRT, in remission, hx of clotting d/o on Eliquis, hx of elevated BMI  > 39, hx of a large hiatal hernia repair x 2 (2022 and 2024), most recent repair by Dr [name] 06/2022,  here for evaluation of long-standing dysphagia sx.   Extra time (30 min) was spent reviewing and summarizing multiple records of prior surgeries with thoracic and general surgery as well as multiple imaging studies that were obtained in the process of dysphagia symptom evaluation and management.   She reports symptoms of food getting stuck at the level of her throat, and feels that it is still difficult to swallow, no food impaction no choking on liquids.  Already had esophagram which demonstrated narrowing at the site of Nissen fundoplication no hernia recurrence and prominent CP bar.  No prior modified barium swallow studies.   I personally reviewed esophagram imaging and there is a visible CP bar present on the study although barium passes through without issues.  Her exam today including flexible laryngoscopy did not reveal pooling of secretions along the piriform or postcricoid area, bilateral vocal folds are mobile, there were no masses or lesions noted on exam.  I discussed exam findings with the patient and advised her to have modified barium swallow test to better determine the primary reason for her symptoms.  She also had significant postcricoid edema and pachydermia and we agreed to initiate management of GERD LPR.  Will start famotidine and Reflux Gourmet.  Of note she also had evidence of nasal congestion mucosal edema along nasal passages and clear secretions throughout the nasal passages and along the pharyngeal wall, consistent with environmental allergies and postnasal drainage.  Will initiate systemic antihistamine and Flonase.   I suspect her symptoms are multifactorial and related to an element of presbyesophagus prior tightening of lower esophageal sphincter during 2 Nissen fundoplication surgeries, and possible CP dysfunction versus CP spasm in the setting of untreated GERD LPR.   Of note no history of recent weight loss despite of symptoms, and no signs of severe dysphagia at CP level since her flexible laryngoscopy did not demonstrate pooling of secretions around the area of piriform sinuses and postcricoid region.   - schedule modified barium swallow  - start Famotidine and Reflux Gourmet for reflux  - start Clarinex and Flonase for suspected allergies and nasal congestion/PND - return after testing - will consider swallow therapy if warranted, will consider CP dilation if warranted in the future  - will consider swallow therapy is warranted in the future   Update 03/19/23 She had MBS which revealed intact oropharyngeal swallowing,  with an instance of a pill getting caught at the distal esophagus at the junction with the stomach. She felt globus sensation in her throat when it occurred. Had mild narrowing at the site on esophagram before. Hx of Nissen fundoplication x 2 for large hiatal hernia.   Dysphagia - appears to be either CP spasm 2/2 GERD vs esophageal 2/2 narrowing at the distal esophagus, was previously dilated  - continue Famotidine 20 mg BID - diet and lifestyle changes to minimize reflux - reflux gourmet   2. Chronic globus/mucus in the throat, likely related to GERD LPR - medical management as above  3. Chronic nasal congestion and post-nasal drainage - continue Clarinex 5 mg daily and Flonase 2 puffs b/l nares BID  RTC 6 mo   Thank you for allowing me to participate in the care of this patient. Please do not hesitate to contact me with any questions or concerns.   Ashok Croon, MD [facility]ol[facility]ology [facility] Phone: [phone] Fax: [phone]    03/19/2023, 1:07 PM

## 2023-03-20 LAB — CBC WITH DIFFERENTIAL/PLATELET
Basophils Absolute: 0.1 10*3/uL (ref 0.0–0.2)
Basos: 1 %
EOS (ABSOLUTE): 0.2 10*3/uL (ref 0.0–0.4)
Eos: 2 %
Hematocrit: 42.6 % (ref 34.0–46.6)
Hemoglobin: 14.3 g/dL (ref 11.1–15.9)
Immature Grans (Abs): 0 10*3/uL (ref 0.0–0.1)
Immature Granulocytes: 1 %
Lymphocytes Absolute: 2.1 10*3/uL (ref 0.7–3.1)
Lymphs: 26 %
MCH: 31.1 pg (ref 26.6–33.0)
MCHC: 33.6 g/dL (ref 31.5–35.7)
MCV: 93 fL (ref 79–97)
Monocytes Absolute: 0.7 10*3/uL (ref 0.1–0.9)
Monocytes: 8 %
Neutrophils Absolute: 5 10*3/uL (ref 1.4–7.0)
Neutrophils: 62 %
Platelets: 396 10*3/uL (ref 150–450)
RBC: 4.6 x10E6/uL (ref 3.77–5.28)
RDW: 13.8 % (ref 11.7–15.4)
WBC: 8.1 10*3/uL (ref 3.4–10.8)

## 2023-03-20 LAB — COMPREHENSIVE METABOLIC PANEL
ALT: 24 [IU]/L (ref 0–32)
AST: 23 [IU]/L (ref 0–40)
Albumin: 4.3 g/dL (ref 3.8–4.9)
Alkaline Phosphatase: 127 [IU]/L — ABNORMAL HIGH (ref 44–121)
BUN/Creatinine Ratio: 11 — ABNORMAL LOW (ref 12–28)
BUN: 15 mg/dL (ref 8–27)
Bilirubin Total: 0.4 mg/dL (ref 0.0–1.2)
CO2: 27 mmol/L (ref 20–29)
Calcium: 10.1 mg/dL (ref 8.7–10.3)
Chloride: 96 mmol/L (ref 96–106)
Creatinine, Ser: 1.33 mg/dL — ABNORMAL HIGH (ref 0.57–1.00)
Globulin, Total: 2.9 g/dL (ref 1.5–4.5)
Glucose: 110 mg/dL — ABNORMAL HIGH (ref 70–99)
Potassium: 3.2 mmol/L — ABNORMAL LOW (ref 3.5–5.2)
Sodium: 137 mmol/L (ref 134–144)
Total Protein: 7.2 g/dL (ref 6.0–8.5)
eGFR: 46 mL/min/{1.73_m2} — ABNORMAL LOW (ref >59)

## 2023-03-20 LAB — LIPID PANEL
Chol/HDL Ratio: 5.4 ratio — ABNORMAL HIGH (ref 0.0–4.4)
Cholesterol, Total: 306 mg/dL — ABNORMAL HIGH (ref 100–199)
HDL: 57 mg/dL (ref >39)
LDL Chol Calc (NIH): 191 mg/dL — ABNORMAL HIGH (ref 0–99)
Triglycerides: 298 mg/dL — ABNORMAL HIGH (ref 0–149)
VLDL Cholesterol Cal: 58 mg/dL — ABNORMAL HIGH (ref 5–40)

## 2023-03-20 LAB — HEMOGLOBIN A1C
Est. average glucose Bld gHb Est-mCnc: 131 mg/dL
Hgb A1c MFr Bld: 6.2 % — ABNORMAL HIGH (ref 4.8–5.6)

## 2023-03-20 LAB — TSH RFX ON ABNORMAL TO FREE T4: TSH: 2.87 u[IU]/mL (ref 0.450–4.500)

## 2023-03-20 LAB — HEPATITIS C ANTIBODY: Hep C Virus Ab: NONREACTIVE

## 2023-03-20 LAB — CA 125: Cancer Antigen (CA) 125: 26.6 U/mL (ref 0.0–38.1)

## 2023-03-20 LAB — LIPASE: Lipase: 25 U/L (ref 14–72)

## 2023-03-26 ENCOUNTER — Ambulatory Visit
Admission: RE | Admit: 2023-03-26 | Discharge: 2023-03-26 | Disposition: A | Discharge status: Home / Self Care | Payer: Medicare Other | Source: Ambulatory Visit | Attending: Adult Health | Admitting: Adult Health

## 2023-03-26 DIAGNOSIS — Z9889 Other specified postprocedural states: Secondary | ICD-10-CM

## 2023-03-31 ENCOUNTER — Telehealth: Payer: Self-pay | Admitting: *Deleted

## 2023-03-31 NOTE — Telephone Encounter (Signed)
She can send me her question about Zio on MyChart.   I am not sure why she was sent a Cologuard, it is possible that it was sent by her insurance company.  I cannot find anything in our system indicating that the order was made by our office.  If she has not tried so thus far, I would recommend taking MiraLAX 3 times a day for 2 days then once a day for at least 1 week.  If she does develop abdominal pain, I recommend going to the emergency room for further evaluation.

## 2023-03-31 NOTE — Telephone Encounter (Signed)
Pt calling to say that she is experiencing constipation and it has been going on for about a month.  She said it is so bad she can't even pass gas.  She said she has tried stool softener and it has not helped.  She said she hopes that she doesn't have some kind of blockage.   She said she had her gallbladder removed and read that this could happen. She also has questions about the Zio monitor, I told her that if it was pertaining to the machine itself she should contact the customer service number on the information that came with it and she said it is not and would not tell me what she wanted to speak to the provider about. She also stated that she was sent a cologuard in the mail she wasn't sure why she received that since she is not due and she goes to the office to have a colonoscopy. Please advise.

## 2023-04-01 NOTE — Telephone Encounter (Signed)
Pt was informed of below.

## 2023-04-13 ENCOUNTER — Ambulatory Visit: Payer: Medicare Other | Admitting: Pulmonary Disease

## 2023-04-13 DIAGNOSIS — R0609 Other forms of dyspnea: Secondary | ICD-10-CM

## 2023-04-13 NOTE — Progress Notes (Signed)
Full PFT performed today. °

## 2023-04-13 NOTE — Patient Instructions (Signed)
Full PFT performed today. °

## 2023-04-21 ENCOUNTER — Ambulatory Visit: Payer: Medicare Other | Admitting: Family Medicine

## 2023-04-23 ENCOUNTER — Inpatient Hospital Stay: Payer: Medicare Other | Admitting: Hematology and Oncology

## 2023-04-28 ENCOUNTER — Ambulatory Visit: Payer: Medicare Other | Admitting: Family Medicine

## 2023-04-30 LAB — PULMONARY FUNCTION TEST
DL/VA % pred: 75 %
DL/VA: 3.28 ml/min/mmHg/L
DLCO cor % pred: 79 %
DLCO cor: 14.01 ml/min/mmHg
DLCO unc % pred: 81 %
DLCO unc: 14.38 ml/min/mmHg
FEF 25-75 Post: 2.46 L/s
FEF 25-75 Pre: 2.68 L/s
FEF2575-%Change-Post: -8 %
FEF2575-%Pred-Post: 115 %
FEF2575-%Pred-Pre: 125 %
FEV1-%Change-Post: 1 %
FEV1-%Pred-Post: 110 %
FEV1-%Pred-Pre: 108 %
FEV1-Post: 2.41 L
FEV1-Pre: 2.37 L
FEV1FVC-%Change-Post: 0 %
FEV1FVC-%Pred-Pre: 104 %
FEV6-%Change-Post: 2 %
FEV6-%Pred-Post: 106 %
FEV6-%Pred-Pre: 104 %
FEV6-Post: 2.9 L
FEV6-Pre: 2.84 L
FEV6FVC-%Pred-Post: 103 %
FEV6FVC-%Pred-Pre: 103 %
FVC-%Change-Post: 2 %
FVC-%Pred-Post: 105 %
FVC-%Pred-Pre: 102 %
FVC-Post: 2.98 L
FVC-Pre: 2.91 L
Post FEV1/FVC ratio: 81 %
Post FEV6/FVC ratio: 100 %
Pre FEV1/FVC ratio: 82 %
Pre FEV6/FVC Ratio: 100 %
RV % pred: 102 %
RV: 1.82 L
TLC % pred: 104 %
TLC: 4.65 L

## 2023-05-08 ENCOUNTER — Other Ambulatory Visit: Payer: Self-pay | Admitting: Family Medicine

## 2023-05-08 DIAGNOSIS — I10 Essential (primary) hypertension: Secondary | ICD-10-CM

## 2023-05-12 ENCOUNTER — Other Ambulatory Visit: Payer: Self-pay | Admitting: Family Medicine

## 2023-05-12 DIAGNOSIS — K219 Gastro-esophageal reflux disease without esophagitis: Secondary | ICD-10-CM

## 2023-05-21 ENCOUNTER — Inpatient Hospital Stay: Payer: Medicare Other | Attending: Hematology and Oncology | Admitting: Hematology and Oncology

## 2023-05-21 VITALS — BP 128/80 | HR 87 | Temp 97.6°F | Resp 17 | Ht 60.0 in | Wt 187.4 lb

## 2023-05-21 DIAGNOSIS — K449 Diaphragmatic hernia without obstruction or gangrene: Secondary | ICD-10-CM | POA: Diagnosis not present

## 2023-05-21 DIAGNOSIS — E785 Hyperlipidemia, unspecified: Secondary | ICD-10-CM | POA: Insufficient documentation

## 2023-05-21 DIAGNOSIS — M1990 Unspecified osteoarthritis, unspecified site: Secondary | ICD-10-CM | POA: Diagnosis not present

## 2023-05-21 DIAGNOSIS — R630 Anorexia: Secondary | ICD-10-CM | POA: Diagnosis not present

## 2023-05-21 DIAGNOSIS — Z79811 Long term (current) use of aromatase inhibitors: Secondary | ICD-10-CM | POA: Insufficient documentation

## 2023-05-21 DIAGNOSIS — Z170 Estrogen receptor positive status [ER+]: Secondary | ICD-10-CM | POA: Diagnosis not present

## 2023-05-21 DIAGNOSIS — R110 Nausea: Secondary | ICD-10-CM | POA: Diagnosis not present

## 2023-05-21 DIAGNOSIS — F4310 Post-traumatic stress disorder, unspecified: Secondary | ICD-10-CM | POA: Insufficient documentation

## 2023-05-21 DIAGNOSIS — Z79899 Other long term (current) drug therapy: Secondary | ICD-10-CM | POA: Insufficient documentation

## 2023-05-21 DIAGNOSIS — Z923 Personal history of irradiation: Secondary | ICD-10-CM | POA: Diagnosis not present

## 2023-05-21 DIAGNOSIS — C50411 Malignant neoplasm of upper-outer quadrant of right female breast: Secondary | ICD-10-CM | POA: Diagnosis present

## 2023-05-21 DIAGNOSIS — R634 Abnormal weight loss: Secondary | ICD-10-CM | POA: Insufficient documentation

## 2023-05-21 DIAGNOSIS — R232 Flushing: Secondary | ICD-10-CM | POA: Insufficient documentation

## 2023-05-21 DIAGNOSIS — Z7901 Long term (current) use of anticoagulants: Secondary | ICD-10-CM | POA: Insufficient documentation

## 2023-05-21 DIAGNOSIS — E7800 Pure hypercholesterolemia, unspecified: Secondary | ICD-10-CM | POA: Insufficient documentation

## 2023-05-21 DIAGNOSIS — Z86711 Personal history of pulmonary embolism: Secondary | ICD-10-CM | POA: Insufficient documentation

## 2023-05-21 MED ORDER — ANASTROZOLE 1 MG PO TABS: 1.0000 mg | ORAL_TABLET | Freq: Every day | ORAL | 3 refills | Status: DC
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
## 2023-05-22 ENCOUNTER — Telehealth: Payer: Self-pay | Admitting: Hematology and Oncology

## 2023-05-22 NOTE — Telephone Encounter (Signed)
 Called patient and left message of scheduled appt for f/u telephone visit.

## 2023-06-02 ENCOUNTER — Encounter: Payer: Self-pay | Admitting: Nurse Practitioner

## 2023-06-02 ENCOUNTER — Ambulatory Visit: Payer: Medicare Other | Admitting: Nurse Practitioner

## 2023-06-02 VITALS — BP 100/80 | HR 92 | Temp 98.4°F | Ht 63.5 in | Wt 189.4 lb

## 2023-06-02 DIAGNOSIS — R911 Solitary pulmonary nodule: Secondary | ICD-10-CM

## 2023-06-02 DIAGNOSIS — R0609 Other forms of dyspnea: Secondary | ICD-10-CM

## 2023-06-02 DIAGNOSIS — J309 Allergic rhinitis, unspecified: Secondary | ICD-10-CM | POA: Diagnosis not present

## 2023-06-02 DIAGNOSIS — C50911 Malignant neoplasm of unspecified site of right female breast: Secondary | ICD-10-CM

## 2023-06-02 DIAGNOSIS — G4719 Other hypersomnia: Secondary | ICD-10-CM | POA: Diagnosis not present

## 2023-06-02 DIAGNOSIS — Z23 Encounter for immunization: Secondary | ICD-10-CM

## 2023-06-02 LAB — CBC WITH DIFFERENTIAL/PLATELET
Basophils Absolute: 0.1 10*3/uL (ref 0.0–0.1)
Basophils Relative: 1 % (ref 0.0–3.0)
Eosinophils Absolute: 0.1 10*3/uL (ref 0.0–0.7)
Eosinophils Relative: 1.3 % (ref 0.0–5.0)
HCT: 40.6 % (ref 36.0–46.0)
Hemoglobin: 13.3 g/dL (ref 12.0–15.0)
Lymphocytes Relative: 18.9 % (ref 12.0–46.0)
Lymphs Abs: 2.1 10*3/uL (ref 0.7–4.0)
MCHC: 32.8 g/dL (ref 30.0–36.0)
MCV: 93.2 fL (ref 78.0–100.0)
Monocytes Absolute: 1.1 10*3/uL — ABNORMAL HIGH (ref 0.1–1.0)
Monocytes Relative: 9.9 % (ref 3.0–12.0)
Neutro Abs: 7.7 10*3/uL (ref 1.4–7.7)
Neutrophils Relative %: 68.9 % (ref 43.0–77.0)
Platelets: 379 10*3/uL (ref 150.0–400.0)
RBC: 4.35 Mil/uL (ref 3.87–5.11)
RDW: 14.4 % (ref 11.5–15.5)
WBC: 11.1 10*3/uL — ABNORMAL HIGH (ref 4.0–10.5)

## 2023-06-02 LAB — BRAIN NATRIURETIC PEPTIDE: Pro B Natriuretic peptide (BNP): 23 pg/mL (ref 0.0–100.0)

## 2023-06-02 NOTE — Progress Notes (Unsigned)
@Patient  ID: [name], female    DOB: 04/17/1961, 63 y.o.   MRN: [id number]  Chief Complaint  Patient presents with   Follow-up    F/u on PFT.    Referring provider: Melida Quitter, PA  HPI: 63 year old female, never smoker followed for DOE and lung nodule.  She is a former patient of Dr. Lind Guest and last seen in office 06/25/2022 for initial pulmonary consult.  Past medical history significant for right breast cancer status postlumpectomy/XRT, paraesophageal hernia s/p repair/gastropexy 06/12/2022, hypertension, GERD, adrenal adenoma, RA, CKD, anxiety, depression, obesity, RLS.  TEST/EVENTS:  06/02/2022 CT chest: Mild atherosclerosis.  Status post right axillary node dissection.  Stable right upper lobe 7 mm nodule; no change since 2020.  Gallstones.  Right adrenal adenoma.  Hiatal hernia.  Postoperative changes in right breast. 04/13/2023 PFT: FVC 102, FEV1 108, ratio 81, TLC 104, DLCOunc 81, DLCOcor 79.  No BD. Hgb 14.3 (03/19/2023)  06/25/2022: OV with Dr. Thora Lance.  Referred for cough and dyspnea with exertion.  Has some orthopnea.  Did have COVID-19 infection 2 years ago but was not hospitalized.  Also has a right middle lobe pulmonary nodule.  Father and grandfathers all died from lung cancer.  She never smoked.  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  Right upper lobe nodule is stable after 4 years of surveillance, likely benign.  Technically further surveillance not needed.  Does worry about family's history and personal history of breast cancer.  Did not recommend further surveillance.  Could consider navigational bronc if she places high value on diagnostic certainty.  Plan to discuss with family.  Trial albuterol.  PFT at follow-up  06/02/2023: Today-follow-up Discussed the use of AI scribe software for clinical note transcription with the patient, who gave verbal consent to proceed.  History of Present Illness   The patient presents with  persistent shortness of breath, particularly with exertion and when lying flat. The patient reports waking up feeling short of breath and needing to sit up quickly to catch her breath. The patient also reports a chronic cough and constant throat clearing, but denies bringing up any sputum. She also reports feeling tired throughout the day. The patient reports sleeping approximately four hours a night, with some difficulty falling asleep and frequent awakenings.   The patient has been using an albuterol inhaler when feeling out of breath, which provides slight relief. She reports becoming short of breath even with activities such as showering, sometimes needing to sit down to catch her breath. The patient denies any chest tightness or wheezing. No history of asthma.   The patient has a family history of cancer, with a father and two sisters diagnosed with various types of cancer, including lung cancer. The patient had stage 1 breast cancer, which was surgically removed. She reports a nodule in the right upper lobe of the lung, which has not changed in size over the past four years and considered benign. She does have a repeat CT chest ordered by her oncologist coming up.   The patient denies any leg swelling, sleep walking, or episodes of sleep paralysis. Unsure if she snores as she lives alone. She also denies any alcohol intake or excessive caffeine intake. The patient is not currently working. No issues with drowsy driving. No sleep aids.       Allergies  Allergen Reactions   Tramadol Itching    "bugs crawling all over"    Immunization History  Administered Date(s)  Administered   Tdap 04/29/2013    Past Medical History:  Diagnosis Date   Acid reflux    takes Zantac and Omeprazole daily   Anemia    Anxiety    takes Citaopram daily   Arthritis    right knee   Arthrosis    left thumb CMC   Breast cancer (HCC)    Chronic back pain    DDD   Clotting disorder (HCC)    hx of blood  clot following knee scope, pt reports hx blood clot in her lung   Depression    Dyspnea on exertion    Fibromyalgia    History of bronchitis 3+yrs ago   Hyperlipidemia    takes Atorvastatin daily   Hypertension    takes Lisinopril and HCTZ daily   Insomnia    takes Elavil nightly as needed   Paraesophageal hernia    Personal history of radiation therapy     Tobacco History: Social History   Tobacco Use  Smoking Status Never   Passive exposure: Never  Smokeless Tobacco Never   Counseling given: Not Answered   Outpatient Medications Prior to Visit  Medication Sig Dispense Refill   amLODipine (NORVASC) 5 MG tablet Take 1 tablet (5 mg total) by mouth daily. 90 tablet 1   anastrozole (ARIMIDEX) 1 MG tablet Take 1 tablet (1 mg total) by mouth daily. 90 tablet 3   apixaban (ELIQUIS) 5 MG TABS tablet TAKE 1 TABLET BY MOUTH TWICE DAILY CRUSH  TABLET  BEFORE  TAKING 60 tablet 2   buPROPion (WELLBUTRIN SR) 150 MG 12 hr tablet Take 1 tablet (150 mg total) by mouth in the morning. 90 tablet 1   cloNIDine (CATAPRES) 0.1 MG tablet Take 1 tablet (0.1 mg total) by mouth 2 (two) times daily. 180 tablet 1   desloratadine (CLARINEX) 5 MG tablet Take 1 tablet (5 mg total) by mouth daily. 90 tablet 3   famotidine (PEPCID) 20 MG tablet Take 1 tablet (20 mg total) by mouth 2 (two) times daily. 30 tablet 1   fluticasone (FLONASE) 50 MCG/ACT nasal spray Place 2 sprays into both nostrils daily. 16 g 6   hydrochlorothiazide (HYDRODIURIL) 50 MG tablet Take 1 tablet (50 mg total) by mouth daily. Crush tablet before taking 90 tablet 1   meclizine (ANTIVERT) 25 MG tablet Take 1 tablet (25 mg total) by mouth 3 (three) times daily as needed for nausea (take 30 minutes before riding in car). 30 tablet 1   metoprolol succinate (TOPROL-XL) 25 MG 24 hr tablet TAKE 1 TABLET BY MOUTH ONCE DAILY *SPLIT TABLET IN HALF PRIOR TO TAKING* 90 tablet 0   VENTOLIN HFA 108 (90 Base) MCG/ACT inhaler INHALE 2 PUFFS BY MOUTH  EVERY 6 HOURS AS NEEDED FOR WHEEZING OR SHORTNESS OF BREATH 18 g 0   Vitamin D, Ergocalciferol, (DRISDOL) 1.25 MG (50000 UNIT) CAPS capsule Take 1 capsule (50,000 Units total) by mouth every 7 (seven) days. 12 capsule 0   FLUoxetine (PROZAC) 20 MG tablet Take 2 tablets (40 mg total) by mouth daily. Crush tablets before taking. 180 tablet 0   No facility-administered medications prior to visit.     Review of Systems:   Constitutional: No weight loss or gain, night sweats, fevers, chills,or lassitude. +fatigue  HEENT: No difficulty swallowing, tooth/dental problems, or sore throat. No sneezing, itching, ear ache. +morning headaches, nasal congestion, postnasal drainage CV:  +orthopnea. No chest pain, PND, swelling in lower extremities, anasarca, dizziness, palpitations, syncope Resp: +shortness of  breath with exertion; dry cough. No excess mucus or change in color of mucus. No hemoptysis. No wheezing.  No chest wall deformity GI:  No heartburn, indigestion, abdominal pain, nausea, vomiting, diarrhea, change in bowel habits, loss of appetite, bloody stools.  GU: No dysuria, change in color of urine, urgency or frequency.  No flank pain, no hematuria  Skin: No rash, lesions, ulcerations MSK:  No joint pain or swelling. Neuro: No dizziness or lightheadedness.  Psych: No depression or anxiety. Mood stable.     Physical Exam:  BP 100/80 (BP Location: Left Arm, Patient Position: Sitting, Cuff Size: Large)   Pulse 92   Temp 98.4 F (36.9 C) (Oral)   Ht 5' 3.5" (1.613 m)   Wt 189 lb 6.4 oz (85.9 kg)   LMP 02/07/2013   SpO2 98%   BMI 33.02 kg/m   GEN: Pleasant, interactive, well-appearing; obese; in no acute distress HEENT:  Normocephalic and atraumatic. PERRLA. Sclera white. Nasal turbinates pink, moist and patent bilaterally. No rhinorrhea present. Oropharynx pink and moist, without exudate or edema. No lesions, ulcerations, or postnasal drip.  NECK:  Supple w/ fair ROM. No JVD present.  Normal carotid impulses w/o bruits. Thyroid symmetrical with no goiter or nodules palpated. No lymphadenopathy.   CV: RRR, no m/r/g, no peripheral edema. Pulses intact, +2 bilaterally. No cyanosis, pallor or clubbing. PULMONARY:  Unlabored, regular breathing. Clear bilaterally A&P w/o wheezes/rales/rhonchi. No accessory muscle use.  GI: BS present and normoactive. Soft, non-tender to palpation. No organomegaly or masses detected.  MSK: No erythema, warmth or tenderness. Cap refil <2 sec all extrem. No deformities or joint swelling noted.  Neuro: A/Ox3. No focal deficits noted.   Skin: Warm, no lesions or rashe Psych: Normal affect and behavior. Judgement and thought content appropriate.     Lab Results:  CBC    Component Value Date/Time   WBC 8.1 03/19/2023 1015   WBC 8.1 11/17/2022 1058   RBC 4.60 03/19/2023 1015   RBC 4.73 11/17/2022 1058   HGB 14.3 03/19/2023 1015   HCT 42.6 03/19/2023 1015   PLT 396 03/19/2023 1015   MCV 93 03/19/2023 1015   MCH 31.1 03/19/2023 1015   MCH 29.6 11/17/2022 1058   MCHC 33.6 03/19/2023 1015   MCHC 33.1 11/17/2022 1058   RDW 13.8 03/19/2023 1015   LYMPHSABS 2.1 03/19/2023 1015   MONOABS 1.0 10/31/2021 1630   EOSABS 0.2 03/19/2023 1015   BASOSABS 0.1 03/19/2023 1015    BMET    Component Value Date/Time   NA 137 03/19/2023 1015   K 3.2 (L) 03/19/2023 1015   CL 96 03/19/2023 1015   CO2 27 03/19/2023 1015   GLUCOSE 110 (H) 03/19/2023 1015   GLUCOSE 127 (H) 11/19/2022 1316   BUN 15 03/19/2023 1015   CREATININE 1.33 (H) 03/19/2023 1015   CREATININE 1.05 (H) 03/27/2021 1208   CREATININE 0.91 04/16/2016 1632   CALCIUM 10.1 03/19/2023 1015   GFRNONAA 46 (L) 11/17/2022 1058   GFRNONAA >60 03/27/2021 1208   GFRNONAA 68 07/05/2014 1138   GFRAA 59 (L) 06/04/2020 1119   GFRAA 79 07/05/2014 1138    BNP    Component Value Date/Time   BNP 145.5 (H) 02/27/2019 1941     Imaging:  No results found.  Administration History     None           Latest Ref Rng & Units 04/13/2023   10:40 AM  PFT Results  FVC-Pre L 2.91   FVC-Predicted Pre %  102   FVC-Post L 2.98   FVC-Predicted Post % 105   Pre FEV1/FVC % % 82   Post FEV1/FCV % % 81   FEV1-Pre L 2.37   FEV1-Predicted Pre % 108   FEV1-Post L 2.41   DLCO uncorrected ml/min/mmHg 14.38   DLCO UNC% % 81   DLCO corrected ml/min/mmHg 14.01   DLCO COR %Predicted % 79   DLVA Predicted % 75   TLC L 4.65   TLC % Predicted % 104   RV % Predicted % 102     No results found for: "NITRICOXIDE"      Assessment & Plan:     Dyspnea on Exertion  Intermittent dyspnea with exertion and orthopnea. Normal PFTs and lung volumes. Mildly moderate diffusion defect. Differential includes cardiac causes (e.g., CHF, diastolic dysfunction), anemia. No evidence of ILD on imaging. Will recheck CBC today. Obtain echo and BNP. Walk test today without hypoxia on room air. Likely component of obesity and deconditioning. Possible component of underlying asthma/reactive airway disease exacerbated by COVID illness. She does receive benefit from SABA use. May consider controller inhaler therapy if no other underlying etiology identified and symptoms persist. Concern for untreated OSA, which could be contributing.  - Order echocardiogram - Order BNP level - Order CBC - Perform walk test to assess exertional oxygen desaturation - Attend upcoming CT chest as scheduled by oncology  - Consider daily inhaler based on results  Excessive daytime sleepiness Symptoms suggestive of sleep apnea including daytime fatigue, insomnia, morning headaches, orthopnea. BMI 33. Discussed potential impact of untreated sleep apnea on cardiovascular health, stroke risk, diabetes, daytime accidents. Explained the importance of a sleep study for diagnosis. Reviewed potential treatments based on severity/symptoms. Discussed benefits of weight loss and healthy weight loss encouraged. Safe driving practices reviewed. - Order  home sleep study - Review sleep study results in follow-up - Discuss treatment options   Breast Cancer Stage 1 breast cancer, treated with surgical resection. No current evidence of recurrence. Monitoring for new symptoms or changes.  - Continue routine oncology follow-up   Lung nodule 7 mm stable nodule, considered benign based on stability. Family history of lung cancer. She is a never smoker. No symptoms of weight loss, anorexia, hemoptysis. - Attend CT chest as ordered by oncology   Allergic rhinitis  - Continue Flonase and antihistamine for postnasal drainage  Follow-up - Schedule follow-up in six weeks to review echocardiogram, sleep study, and lab results        Advised if symptoms do not improve or worsen, to please contact office for sooner follow up or seek emergency care.   I spent 45 minutes of dedicated to the care of this patient on the date of this encounter to include pre-visit review of records, face-to-face time with the patient discussing conditions above, post visit ordering of testing, clinical documentation with the electronic health record, making appropriate referrals as documented, and communicating necessary findings to members of the patients care team.  Noemi Chapel, NP 06/02/2023  Pt aware and understands NP's role.

## 2023-06-02 NOTE — Patient Instructions (Addendum)
Continue Albuterol inhaler 2 puffs every 6 hours as needed for shortness of breath or wheezing. Notify if symptoms persist despite rescue inhaler/neb use.  Continue flonase nasal spray Continue clarinex 1 tab daily for allergies  Your lung function testing showed normal lung function. You did have a slight reduction in gas exchange, which could be related to multiple things including heart disease or anemia. We will have you complete some lab work today and an echocardiogram of the heart, which someone will contact you to schedule  Walk test normal  Attend CT chest as scheduled - review with oncology   Given your symptoms, I am concerned that you may have sleep disordered breathing with sleep apnea. You will need a sleep study for further evaluation. Someone will contact you to schedule this.   We discussed how untreated sleep apnea puts an individual at risk for cardiac arrhthymias, pulm HTN, DM, stroke and increases their risk for daytime accidents. We also briefly reviewed treatment options including weight loss, side sleeping position, oral appliance, CPAP therapy or referral to ENT for possible surgical options  Use caution when driving and pull over if you become sleepy.  Follow up in 6 weeks with Katie Kaytie Ratcliffe,NP. Will get you set up with a pulmonary doctor at this appointment, pending above testing. If symptoms do not improve or worsen, please contact office for sooner follow up or seek emergency care.

## 2023-06-04 ENCOUNTER — Encounter: Payer: Self-pay | Admitting: Nurse Practitioner

## 2023-06-05 ENCOUNTER — Ambulatory Visit (HOSPITAL_COMMUNITY)
Admission: RE | Admit: 2023-06-05 | Discharge: 2023-06-05 | Disposition: A | Discharge status: Home / Self Care | Payer: Medicare Other | Source: Ambulatory Visit | Attending: Hematology and Oncology | Admitting: Hematology and Oncology

## 2023-06-05 ENCOUNTER — Encounter (HOSPITAL_COMMUNITY): Payer: Self-pay

## 2023-06-05 DIAGNOSIS — Z170 Estrogen receptor positive status [ER+]: Secondary | ICD-10-CM | POA: Diagnosis present

## 2023-06-05 DIAGNOSIS — C50411 Malignant neoplasm of upper-outer quadrant of right female breast: Secondary | ICD-10-CM | POA: Insufficient documentation

## 2023-06-05 DIAGNOSIS — N63 Unspecified lump in unspecified breast: Secondary | ICD-10-CM | POA: Diagnosis present

## 2023-06-05 LAB — POCT I-STAT CREATININE: Creatinine, Ser: 1.5 mg/dL — ABNORMAL HIGH (ref 0.44–1.00)

## 2023-06-05 MED ORDER — HEPARIN SOD (PORK) LOCK FLUSH 100 UNIT/ML IV SOLN: 500.0000 [IU] | Freq: Once | INTRAVENOUS | Status: DC

## 2023-06-05 MED ORDER — IOHEXOL 300 MG/ML  SOLN
80.0000 mL | Freq: Once | INTRAMUSCULAR | Status: AC | PRN
  Administered 2023-06-05: 80 mL via INTRAVENOUS

## 2023-06-11 ENCOUNTER — Ambulatory Visit (INDEPENDENT_AMBULATORY_CARE_PROVIDER_SITE_OTHER): Payer: Medicare Other | Admitting: Family Medicine

## 2023-06-11 ENCOUNTER — Encounter: Payer: Self-pay | Admitting: Family Medicine

## 2023-06-11 ENCOUNTER — Inpatient Hospital Stay: Payer: Medicare Other | Admitting: Hematology and Oncology

## 2023-06-11 VITALS — BP 104/72 | HR 78 | Ht 63.5 in | Wt 188.5 lb

## 2023-06-11 DIAGNOSIS — R1084 Generalized abdominal pain: Secondary | ICD-10-CM

## 2023-06-11 DIAGNOSIS — Z170 Estrogen receptor positive status [ER+]: Secondary | ICD-10-CM

## 2023-06-11 DIAGNOSIS — E876 Hypokalemia: Secondary | ICD-10-CM | POA: Diagnosis not present

## 2023-06-11 DIAGNOSIS — R110 Nausea: Secondary | ICD-10-CM | POA: Diagnosis not present

## 2023-06-11 DIAGNOSIS — K5900 Constipation, unspecified: Secondary | ICD-10-CM

## 2023-06-11 DIAGNOSIS — C50411 Malignant neoplasm of upper-outer quadrant of right female breast: Secondary | ICD-10-CM
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
## 2023-06-11 NOTE — Patient Instructions (Signed)
Start taking MiraLax once daily to soften your stools and decrease your constipation.

## 2023-06-11 NOTE — Progress Notes (Signed)
Established Patient Office Visit  Subjective   Patient ID: [name], female    DOB: [birth year]  Age: 63 y.o. MRN: [id number]  No chief complaint on file.   HPI [name] is a 63 y.o. female presenting today for follow up of nausea.  At her most recent appointment, she endorsed nausea and vomiting every time that she rides in a car which had been ongoing for several months at that point. It happened occasionally before her hiatal hernia surgery In February 2024, but it has become more frequent and more intense. Evaluation by ENT with scopes did not reveal any esophageal abnormality. Associated symptoms include occasional dizziness and hand tremors. She noted a decreased appetite, early satiety, and weight loss of almost 30 pounds. She denied abdominal pain, fever, chills. She endorsed occasional palpitations and dyspnea on exertion, denied chest pain.  Starting workup was CBC, CMP, A1c, lipid panel, lipase, TSH, CA125.  Given the presence of palpitations, also ordered long-term heart monitor and echocardiogram.  Since that time, she had a follow-up with ENT and they recommended trial of famotidine and reflux Gourmet, Claritin and Flonase.  They are still considering swallow therapy if needed in the future.  She also followed up with pulmonology recently.  Of note, it has now been more than 2 months since labs were collected by primary care.  Today, she revealed that she has not actually seen a gastroenterologist for evaluation of her ongoing nausea.  She did see surgeons for her hiatal hernia but did not have any gastroenterology appointments past that point.  She is still experiencing daily nausea as well as occasional abdominal pain.  She states that she is very constipated having a bowel movement approximately every 2 weeks.  The stool is very hard in texture.  Outpatient Medications Prior to Visit  Medication Sig   amLODipine (NORVASC) 5 MG tablet Take 1 tablet (5 mg total) by mouth daily.    anastrozole (ARIMIDEX) 1 MG tablet Take 1 tablet (1 mg total) by mouth daily.   apixaban (ELIQUIS) 5 MG TABS tablet TAKE 1 TABLET BY MOUTH TWICE DAILY CRUSH  TABLET  BEFORE  TAKING   buPROPion (WELLBUTRIN SR) 150 MG 12 hr tablet Take 1 tablet (150 mg total) by mouth in the morning.   cloNIDine (CATAPRES) 0.1 MG tablet Take 1 tablet (0.1 mg total) by mouth 2 (two) times daily.   desloratadine (CLARINEX) 5 MG tablet Take 1 tablet (5 mg total) by mouth daily.   famotidine (PEPCID) 20 MG tablet Take 1 tablet (20 mg total) by mouth 2 (two) times daily.   fluticasone (FLONASE) 50 MCG/ACT nasal spray Place 2 sprays into both nostrils daily.   hydrochlorothiazide (HYDRODIURIL) 50 MG tablet Take 1 tablet (50 mg total) by mouth daily. Crush tablet before taking   metoprolol succinate (TOPROL-XL) 25 MG 24 hr tablet TAKE 1 TABLET BY MOUTH ONCE DAILY *SPLIT TABLET IN HALF PRIOR TO TAKING*   VENTOLIN HFA 108 (90 Base) MCG/ACT inhaler INHALE 2 PUFFS BY MOUTH EVERY 6 HOURS AS NEEDED FOR WHEEZING OR SHORTNESS OF BREATH   Vitamin D, Ergocalciferol, (DRISDOL) 1.25 MG (50000 UNIT) CAPS capsule Take 1 capsule (50,000 Units total) by mouth every 7 (seven) days.   [DISCONTINUED] meclizine (ANTIVERT) 25 MG tablet Take 1 tablet (25 mg total) by mouth 3 (three) times daily as needed for nausea (take 30 minutes before riding in car).   FLUoxetine (PROZAC) 20 MG tablet Take 2 tablets (40 mg total) by  mouth daily. Crush tablets before taking.   No facility-administered medications prior to visit.    ROS Negative unless otherwise noted in HPI   Objective:     BP 104/72   Pulse 78   Ht 5' 3.5" (1.613 m)   Wt 188 lb 8 oz (85.5 kg)   LMP 02/07/2013   SpO2 96%   BMI 32.87 kg/m   Physical Exam Constitutional:      General: She is not in acute distress.    Appearance: Normal appearance.  HENT:     Head: Normocephalic and atraumatic.  Pulmonary:     Effort: Pulmonary effort is normal. No respiratory distress.   Musculoskeletal:     Cervical back: Normal range of motion.  Neurological:     General: No focal deficit present.     Mental Status: She is alert and oriented to person, place, and time. Mental status is at baseline.  Psychiatric:        Mood and Affect: Mood normal.        Thought Content: Thought content normal.        Judgment: Judgment normal.      Assessment & Plan:  Nausea -     Comprehensive metabolic panel; Future -     US Abdomen Complete; Future  Hypokalemia -     Comprehensive metabolic panel; Future  Generalized abdominal pain -     US Abdomen Complete; Future  Constipation, unspecified constipation type -     US Abdomen Complete; Future   Labs within normal limits other than LDL further increased to 191, triglycerides 298, VLDL elevated 58, A1c 6.2, creatinine and GFR fairly stable, low potassium, elevated alkaline phosphatase.  As this was quite sometime ago, repeating CMP today.  Likely will decrease or discontinue hydrochlorothiazide given previous hypokalemia and soft blood pressure in the office today and at home.  Of note, her oncologist did recently switch letrozole to anastrozole due to complaints of nausea.  If imaging and labs do not show any significant abnormalities, it is very possible that nausea is due to medication.  Return in about 1 month (around 07/10/2023) for follow-up for nausea, review imaging and labs.    Melida Quitter, PA

## 2023-06-12 ENCOUNTER — Encounter: Payer: Self-pay | Admitting: Family Medicine

## 2023-06-12 LAB — COMPREHENSIVE METABOLIC PANEL
ALT: 22 [IU]/L (ref 0–32)
AST: 20 [IU]/L (ref 0–40)
Albumin: 4.2 g/dL (ref 3.9–4.9)
Alkaline Phosphatase: 114 [IU]/L (ref 44–121)
BUN/Creatinine Ratio: 11 — ABNORMAL LOW (ref 12–28)
BUN: 16 mg/dL (ref 8–27)
Bilirubin Total: 0.3 mg/dL (ref 0.0–1.2)
CO2: 23 mmol/L (ref 20–29)
Calcium: 10 mg/dL (ref 8.7–10.3)
Chloride: 95 mmol/L — ABNORMAL LOW (ref 96–106)
Creatinine, Ser: 1.41 mg/dL — ABNORMAL HIGH (ref 0.57–1.00)
Globulin, Total: 2.7 g/dL (ref 1.5–4.5)
Glucose: 121 mg/dL — ABNORMAL HIGH (ref 70–99)
Potassium: 3 mmol/L — ABNORMAL LOW (ref 3.5–5.2)
Sodium: 141 mmol/L (ref 134–144)
Total Protein: 6.9 g/dL (ref 6.0–8.5)
eGFR: 42 mL/min/{1.73_m2} — ABNORMAL LOW (ref >59)

## 2023-06-13 ENCOUNTER — Other Ambulatory Visit: Payer: Self-pay | Admitting: Family Medicine

## 2023-06-13 DIAGNOSIS — I2699 Other pulmonary embolism without acute cor pulmonale: Secondary | ICD-10-CM

## 2023-06-16 ENCOUNTER — Telehealth: Payer: Self-pay | Admitting: Nurse Practitioner

## 2023-06-16 NOTE — Telephone Encounter (Signed)
Patient would like to discuss CPAP machine. Patient phone number is [phone].

## 2023-06-17 NOTE — Telephone Encounter (Signed)
 Called and spoke to patient. She stated that she received a call stating that HST was to be picked up from our office, however she does not have transportation. She was under the impression that [facility] would delivery it.   PCC's, please advise. Thanks

## 2023-06-17 NOTE — Telephone Encounter (Signed)
Copied from CRM [id number]. Topic: Clinical - Medical Advice >> Jun 17, 2023 10:11 AM Gildardo Pounds wrote: Reason for CRM: Patient cannot do the test ordered on the date the facility tried to schedule. Callback number is [phone]

## 2023-06-18 ENCOUNTER — Encounter: Payer: Self-pay | Admitting: Family Medicine

## 2023-06-18 NOTE — Telephone Encounter (Signed)
 Pt's insurance is not in network with [facility].   I left a VM & will try again tomorrow.

## 2023-06-19 IMAGING — US US  BREAST BX W/ LOC DEV 1ST LESION IMG BX SPEC US GUIDE*R*
1 series · 9 of 9 positions shown · non-contrast
Comparison: Previous exam(s).
COMPARISON: Previous exam(s).

Addendum:
CLINICAL DATA: 59-year-old female with a suspicious right breast
mass.

EXAM:
ULTRASOUND GUIDED RIGHT BREAST CORE NEEDLE BIOPSY

[Series 1: us breast bx w/ loc dev 1st lesion img bx spec us  · 0.06mm/px · 9 of 9 slices shown]
[im 1/9]
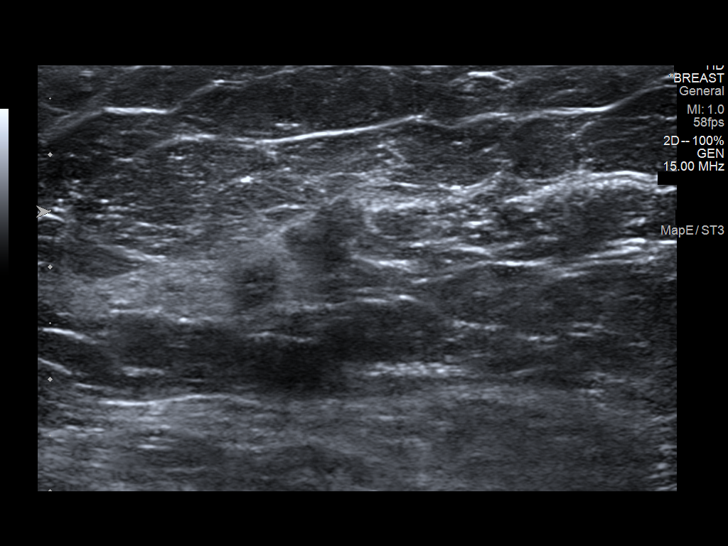
[im 2/9]
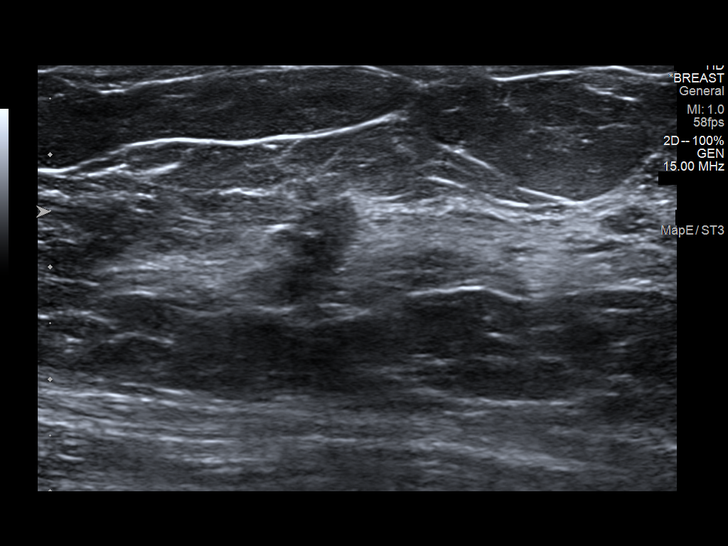
[im 3/9]
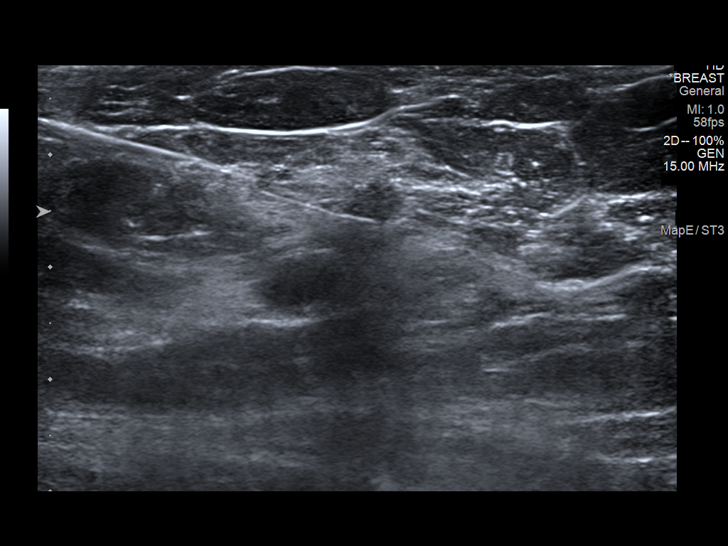
[im 4/9]
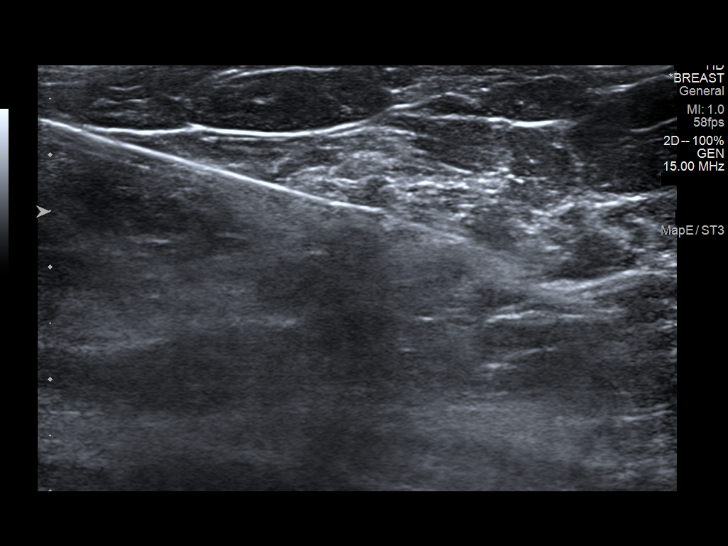
[im 5/9]
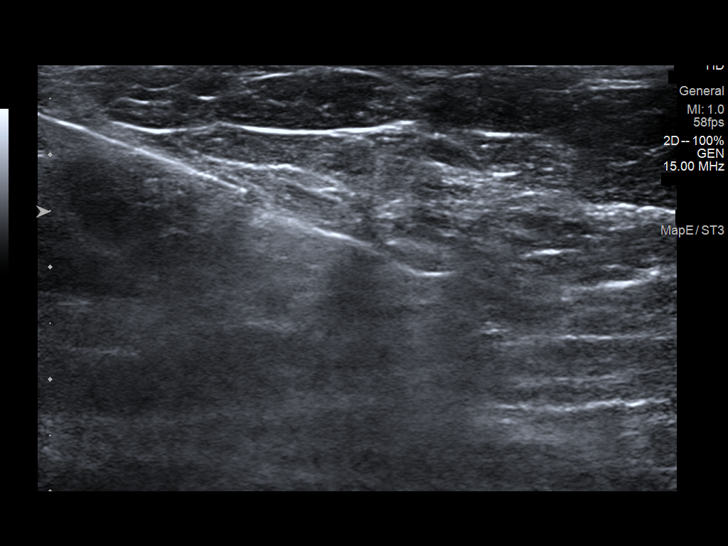
[im 6/9]
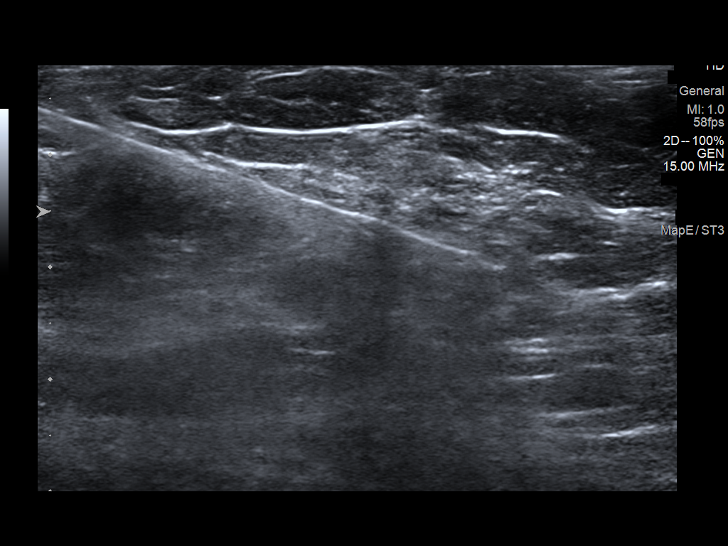
[im 7/9]
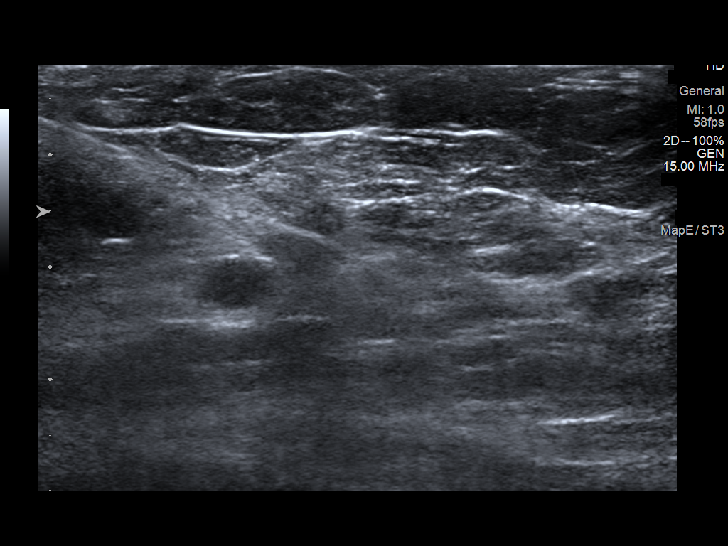
[im 8/9]
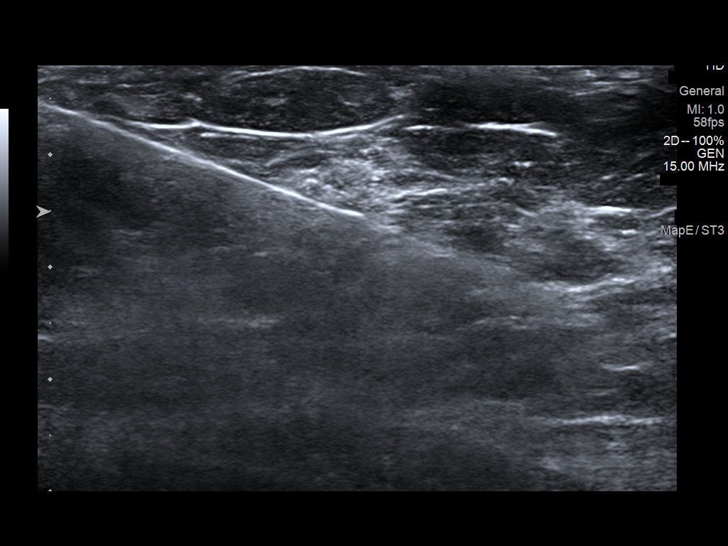
[im 9/9]
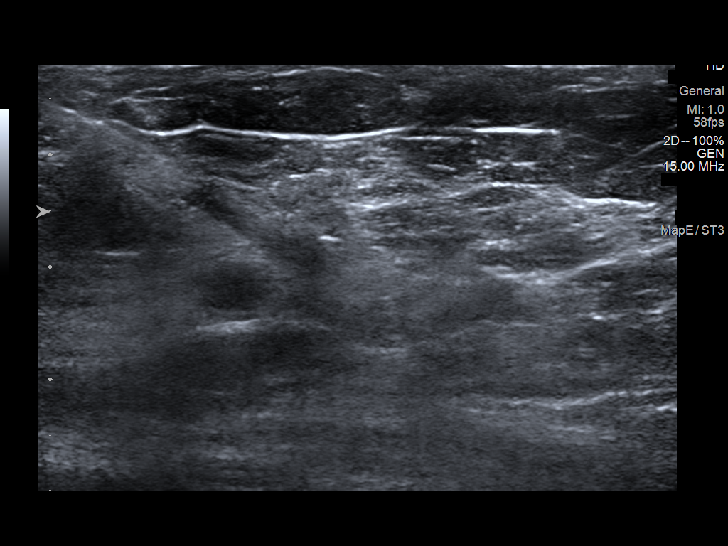

[9 of 9 positions shown; findings below may reference images not displayed]



Lesion quadrant: Upper outer quadrant

Using sterile technique and 1% Lidocaine as local anesthetic, under
direct ultrasound visualization, a 14 gauge Franko device was
used to perform biopsy of a right breast mass at the 10 o'clock
position using a lateral approach. At the conclusion of the
procedure a ribbon shaped tissue marker clip was deployed into the
biopsy cavity. Follow up 2 view mammogram was performed and dictated
separately.
IMPRESSION: Ultrasound guided biopsy of the right breast. No apparent
complications.

ADDENDUM:
Pathology revealed GRADE I INVASIVE DUCTAL CARCINOMA, DUCTAL
CARCINOMA IN SITU of the RIGHT breast, 10:00 o'clock 3cmfn, (ribbon
clip). This was found to be concordant by Dr. Matilde Hou.

Pathology results were discussed with the patient by telephone by
Stevo Montemayor, RN Nurse Navigator. The patient reported doing well
after the biopsy with tenderness at the site. Post biopsy
instructions and care were reviewed and questions were answered. The
patient was encouraged to call [REDACTED] for any additional concerns.

The patient was referred to [REDACTED]
[REDACTED] at [REDACTED] on
March 27, 2021.

Pathology results reported by Joao Lima Fuhr, RN on 03/19/2021.



Lesion quadrant: Upper outer quadrant

Using sterile technique and 1% Lidocaine as local anesthetic, under
direct ultrasound visualization, a 14 gauge Franko device was
used to perform biopsy of a right breast mass at the 10 o'clock
position using a lateral approach. At the conclusion of the
procedure a ribbon shaped tissue marker clip was deployed into the
biopsy cavity. Follow up 2 view mammogram was performed and dictated
separately.
IMPRESSION: Ultrasound guided biopsy of the right breast. No apparent
complications.

## 2023-06-19 IMAGING — MG MM BREAST LOCALIZATION CLIP
4 series · 4 of 12 positions shown · non-contrast
Comparison: Previous exam(s).

CLINICAL DATA: Status post right breast ultrasound-guided biopsy.

EXAM:
3D DIAGNOSTIC RIGHT MAMMOGRAM POST ULTRASOUND BIOPSY

[R CC synth-2D]
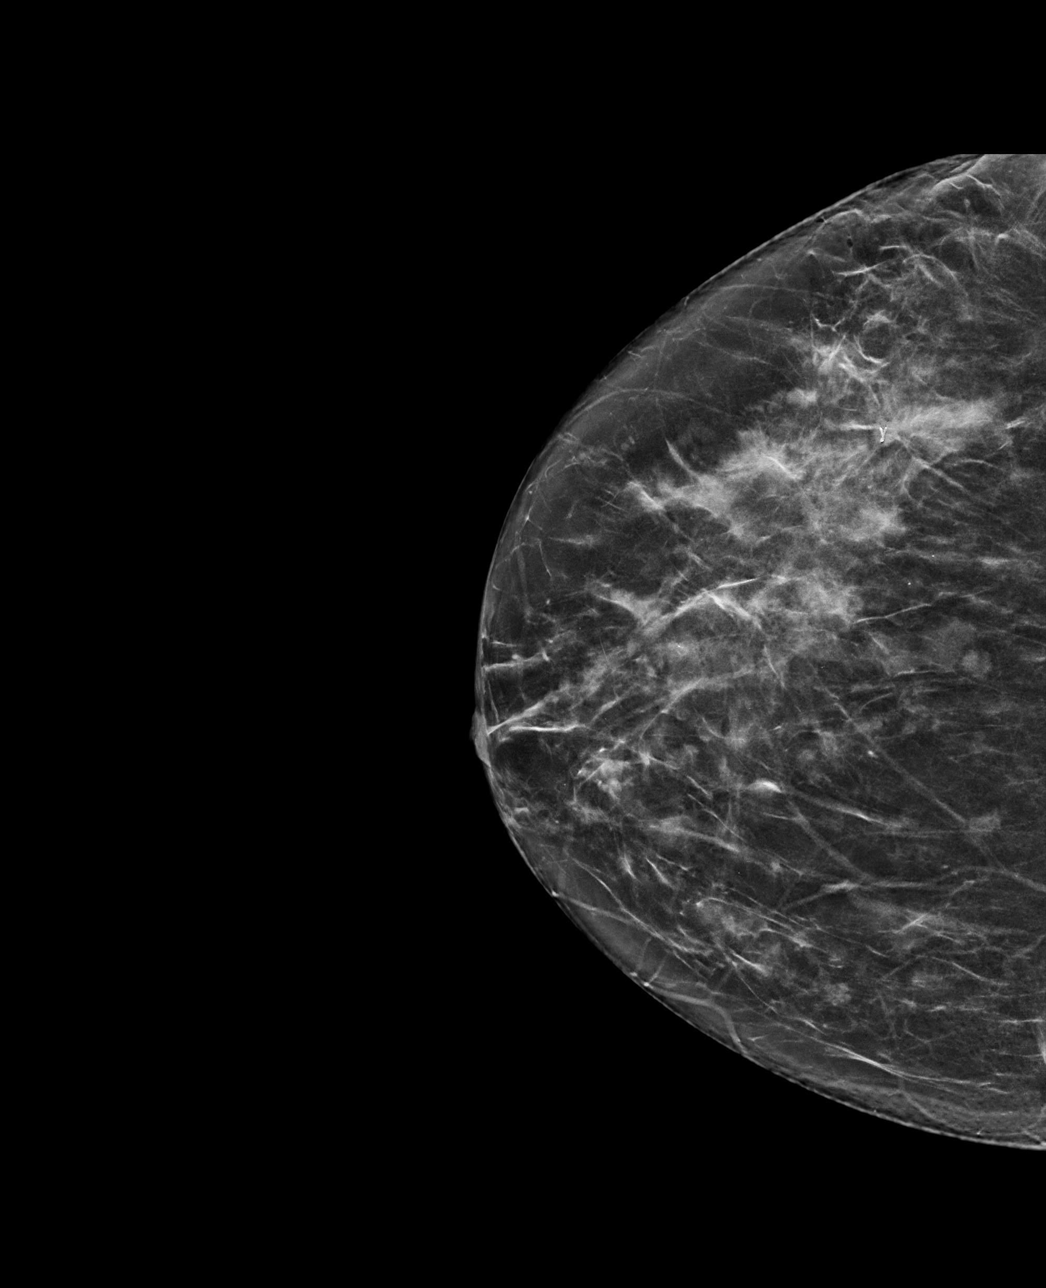

[R ML synth-2D]
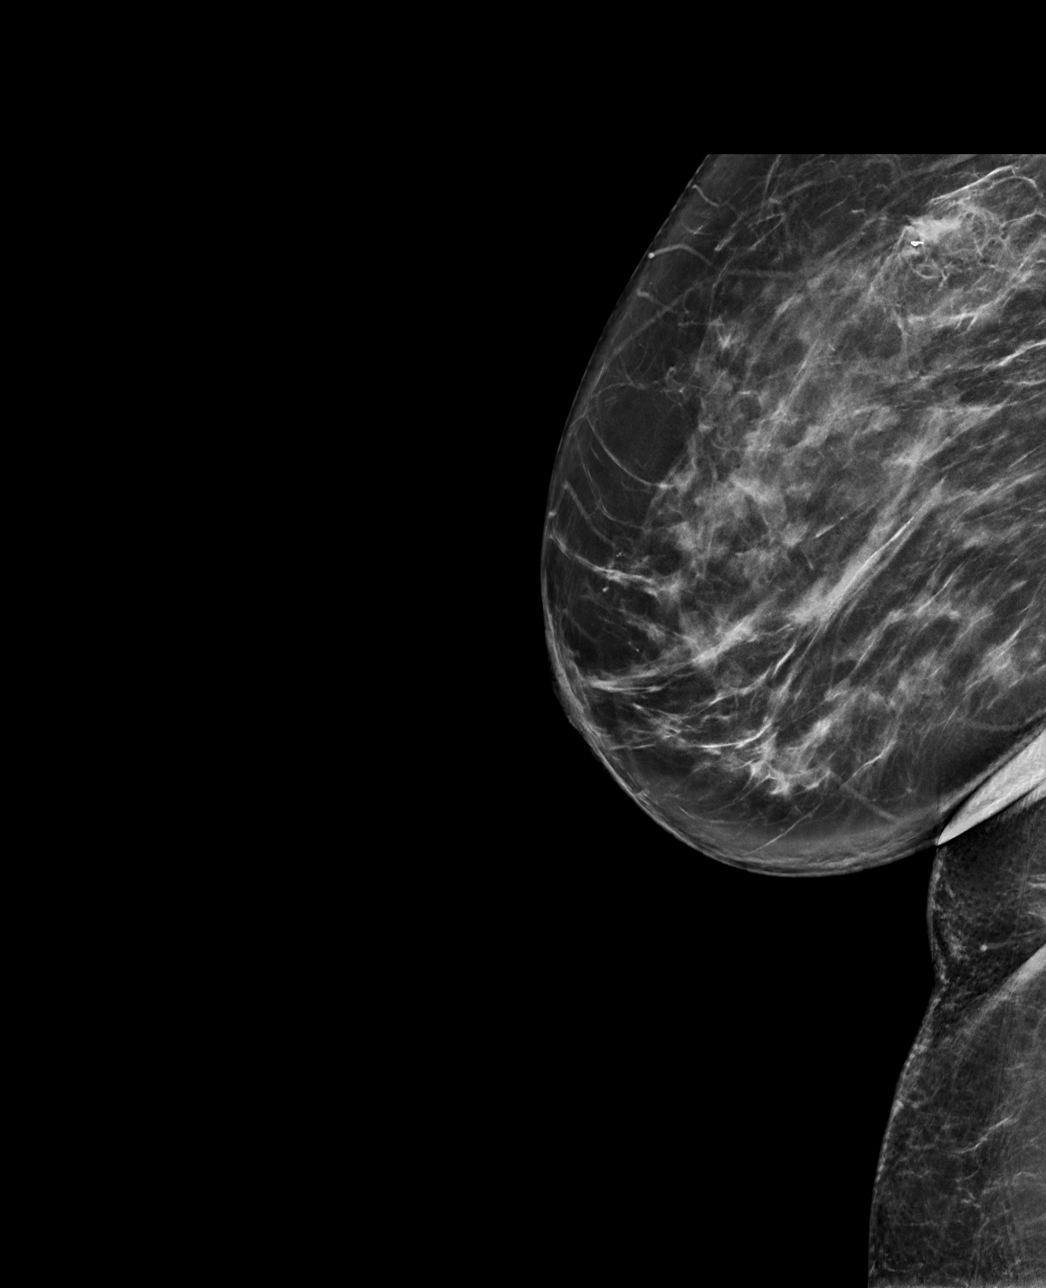

[R CC tomo · tomo slice 35/68.0]
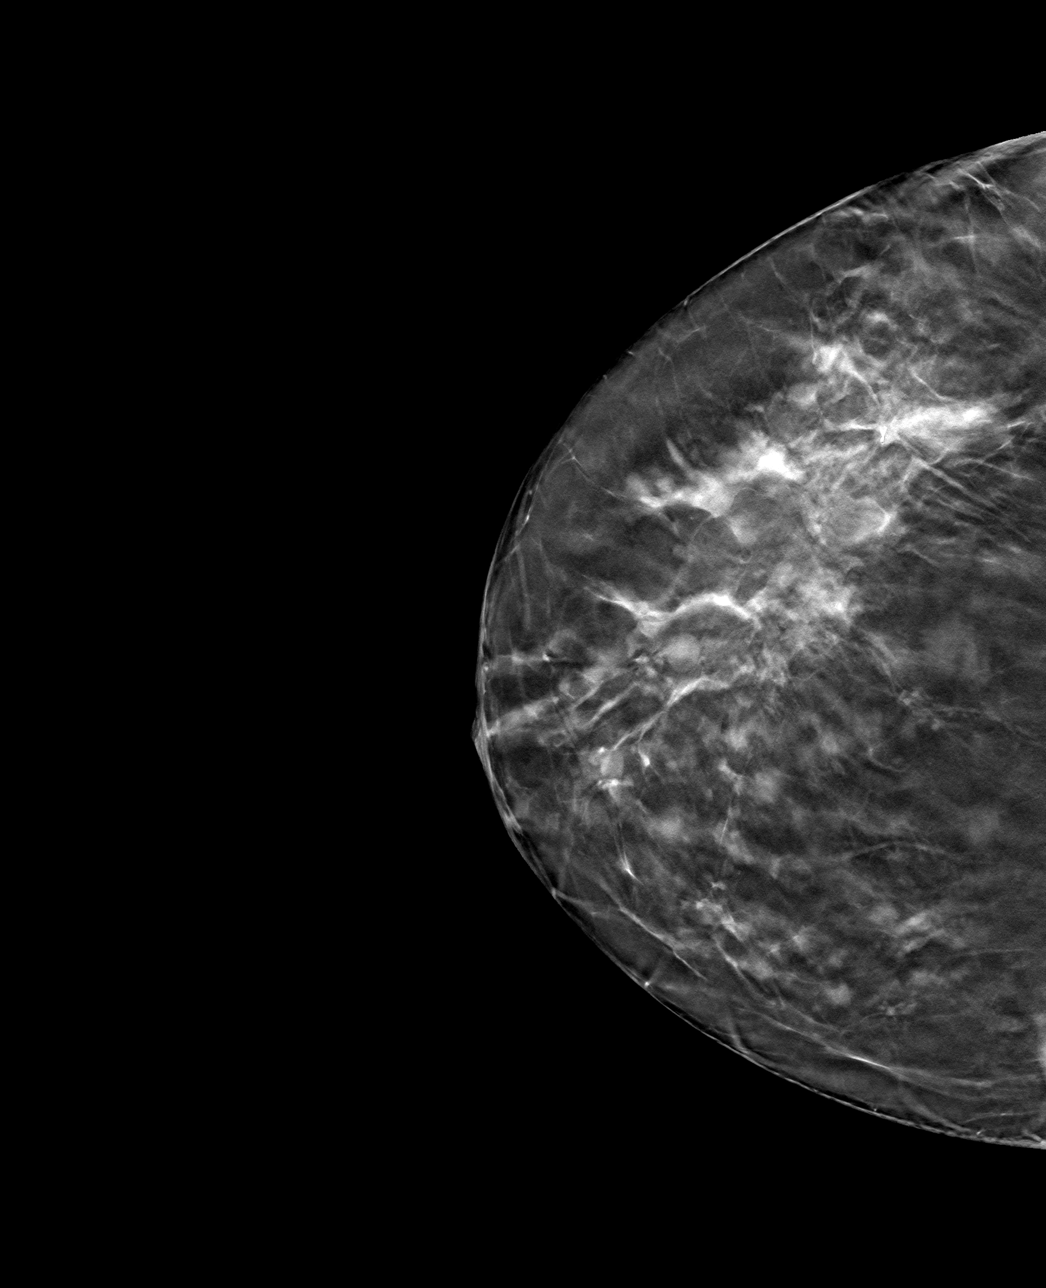

[R ML tomo · tomo slice 42/83.0]
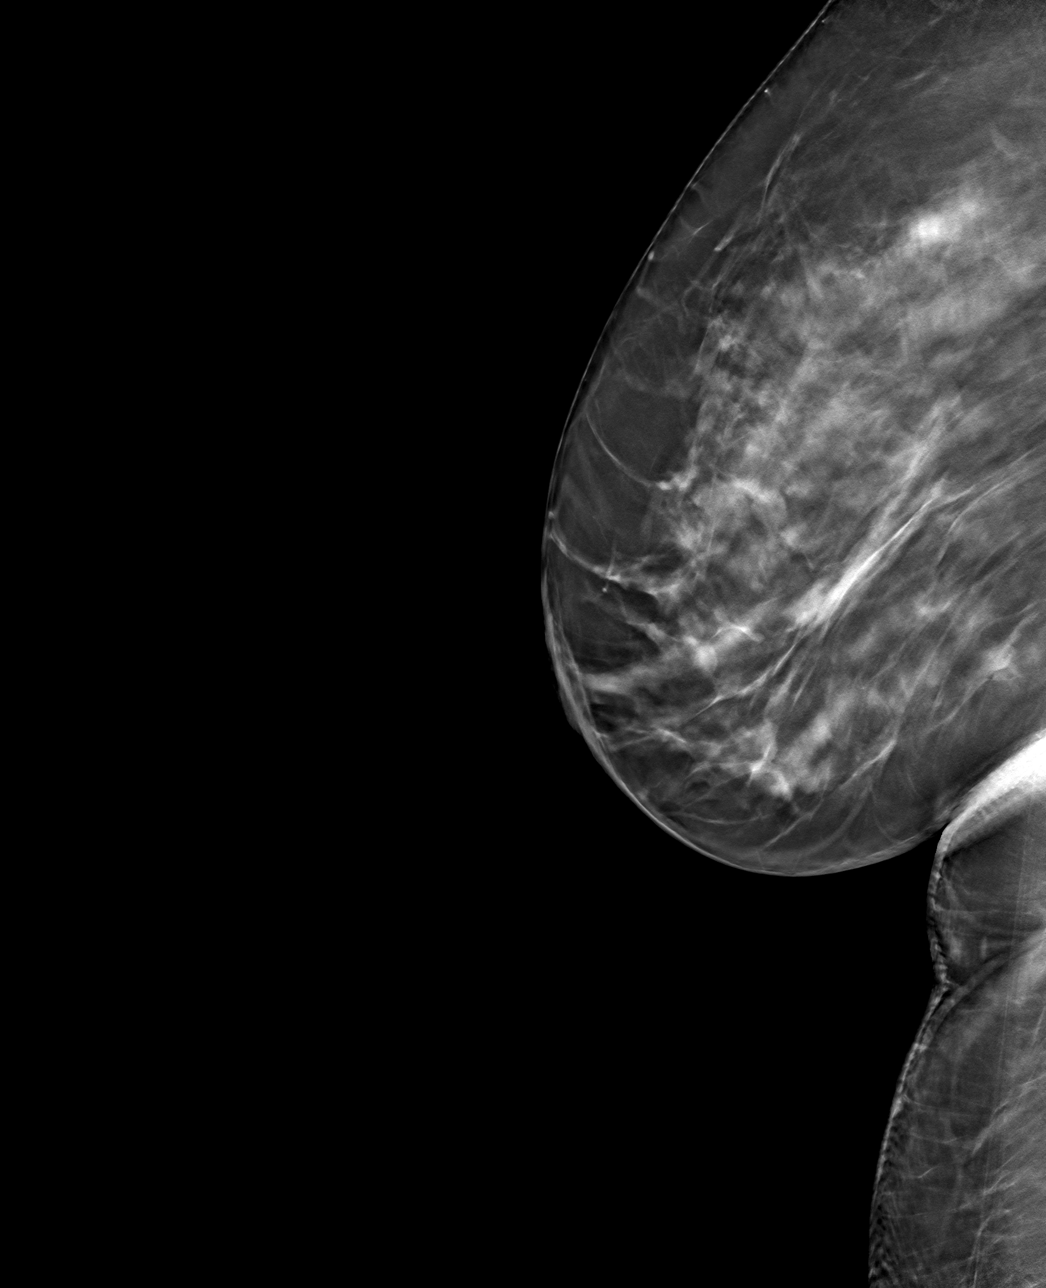

[4 of 12 positions shown; findings below may reference images not displayed]

FINDINGS: 3D Mammographic images were obtained following ultrasound guided
biopsy of the right breast. The biopsy marking clip is in expected
position at the site of biopsy.
IMPRESSION: Appropriate positioning of the ribbon shaped biopsy marking clip at
the site of biopsy in the upper-outer right breast.

Final Assessment: Post Procedure Mammograms for Marker Placement

## 2023-06-25 ENCOUNTER — Ambulatory Visit (HOSPITAL_COMMUNITY): Payer: Medicare Other | Attending: Cardiology

## 2023-06-25 DIAGNOSIS — R0609 Other forms of dyspnea: Secondary | ICD-10-CM | POA: Diagnosis present

## 2023-06-25 LAB — ECHOCARDIOGRAM COMPLETE
Area-P 1/2: 3.77 cm2
MV M vel: 4.58 m/s
MV Peak grad: 83.9 mm[Hg]
Radius: 0.41 cm
S' Lateral: 3.07 cm

## 2023-07-01 ENCOUNTER — Telehealth: Payer: Self-pay

## 2023-07-01 NOTE — Telephone Encounter (Signed)
Patient scheduled 2/21.  Nothing further needed at this time,

## 2023-07-01 NOTE — Telephone Encounter (Signed)
Copied from CRM [id number]. Topic: Appointments - Scheduling Inquiry for Clinic >> Jul 01, 2023 10:46 AM [name] wrote: Reason for CRM: Patient calling because supposed to have Abdominal Ultrasound done but facility has no scheduling options that work for her. Wants to know what to do next? [phone]

## 2023-07-01 NOTE — Telephone Encounter (Signed)
If there is absolutely no time that she can have the abdominal ultrasound with them in the next few weeks, then can we change the location of the order, or will I need to submit a completely new order?  Is also difficult to know what the scheduling options will be at another location if we were to send it somewhere new.

## 2023-07-03 ENCOUNTER — Encounter: Payer: Self-pay | Admitting: Family Medicine

## 2023-07-03 ENCOUNTER — Ambulatory Visit: Payer: Medicare Other

## 2023-07-03 DIAGNOSIS — G4719 Other hypersomnia: Secondary | ICD-10-CM

## 2023-07-03 NOTE — Telephone Encounter (Signed)
Contacted [facility] spoke with radiologist and they stated that if there are no new symptoms currently that the CT in January showed all of the organs that would have been visualized in January. 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

## 2023-07-03 NOTE — Progress Notes (Signed)
Unremarkable echo. Mild valve leakage but nothing that would cause significant symptoms. Cardiology or her PCP should monitor this periodically. Thanks.

## 2023-07-07 ENCOUNTER — Telehealth: Payer: Self-pay | Admitting: Nurse Practitioner

## 2023-07-07 ENCOUNTER — Encounter: Payer: Self-pay | Admitting: *Deleted

## 2023-07-07 NOTE — Telephone Encounter (Signed)
 Patient is returning phone call about sleep test. Patient phone number is [phone].

## 2023-07-07 NOTE — Telephone Encounter (Signed)
 Looks like it was the nurse that called the patient

## 2023-07-08 NOTE — Telephone Encounter (Signed)
 MyChart message sent to patient about results. Nothing further needed at this time.

## 2023-07-12 ENCOUNTER — Other Ambulatory Visit: Payer: Self-pay | Admitting: Family Medicine

## 2023-07-12 DIAGNOSIS — I10 Essential (primary) hypertension: Secondary | ICD-10-CM

## 2023-07-13 ENCOUNTER — Encounter: Payer: Self-pay | Admitting: Family Medicine

## 2023-07-13 ENCOUNTER — Other Ambulatory Visit: Payer: Self-pay | Admitting: Family Medicine

## 2023-07-13 ENCOUNTER — Ambulatory Visit (INDEPENDENT_AMBULATORY_CARE_PROVIDER_SITE_OTHER): Payer: Medicare Other | Admitting: Family Medicine

## 2023-07-13 VITALS — BP 130/84 | HR 83 | Ht 63.5 in | Wt 200.2 lb

## 2023-07-13 DIAGNOSIS — I2699 Other pulmonary embolism without acute cor pulmonale: Secondary | ICD-10-CM

## 2023-07-13 DIAGNOSIS — I10 Essential (primary) hypertension: Secondary | ICD-10-CM | POA: Diagnosis not present

## 2023-07-13 DIAGNOSIS — E876 Hypokalemia: Secondary | ICD-10-CM

## 2023-07-13 DIAGNOSIS — R110 Nausea: Secondary | ICD-10-CM

## 2023-07-13 MED ORDER — ONDANSETRON HCL 4 MG PO TABS: 4.0000 mg | ORAL_TABLET | Freq: Three times a day (TID) | ORAL | 3 refills | Status: AC | PRN

## 2023-07-13 NOTE — Patient Instructions (Addendum)
 I have sent a nausea medicine called Zofran (ondansetron) to the pharmacy for you to try while I am waiting to hear back from your oncologist.  Continue to monitor your blood pressure.  Keep a log of what your blood pressure is at home.  It should be consistently less than 130/80.  Please let us know right away if it is above these numbers more often than not.

## 2023-07-13 NOTE — Progress Notes (Signed)
 Established Patient Office Visit  Subjective   Patient ID: [name], female    DOB: Aug 31, 1960  Age: 63 y.o. MRN: [id number]  Chief Complaint  Patient presents with   Nausea    HPI [name] is a 63 y.o. female presenting today for follow up of nausea.  CMP revealed hypokalemia hypochloremia.  This result is not surprising giving frequency of nausea and vomiting.  Recommended to discontinue hydrochlorothiazide after receiving those results.  She had an abdominal CT scan shortly prior to that appointment, results unremarkable the exception of adrenal adenoma, scattered colonic stool, coronary artery calcifications.  Oncology changed letrozole to anastrozole due to complaints of nausea while taking letrozole.  She states that nausea is about what it was at her last primary care appointment, she has not noticed a huge difference after switching from letrozole to anastrozole.  Outpatient Medications Prior to Visit  Medication Sig Note   amLODipine (NORVASC) 5 MG tablet Take 1 tablet (5 mg total) by mouth daily.    anastrozole (ARIMIDEX) 1 MG tablet Take 1 tablet (1 mg total) by mouth daily.    buPROPion (WELLBUTRIN SR) 150 MG 12 hr tablet Take 1 tablet (150 mg total) by mouth in the morning.    cloNIDine (CATAPRES) 0.1 MG tablet Take 1 tablet by mouth twice daily    desloratadine (CLARINEX) 5 MG tablet Take 1 tablet (5 mg total) by mouth daily.    famotidine (PEPCID) 20 MG tablet Take 1 tablet (20 mg total) by mouth 2 (two) times daily.    fluticasone (FLONASE) 50 MCG/ACT nasal spray Place 2 sprays into both nostrils daily.    metoprolol succinate (TOPROL-XL) 25 MG 24 hr tablet TAKE 1 TABLET BY MOUTH ONCE DAILY *SPLIT TABLET IN HALF PRIOR TO TAKING*    VENTOLIN HFA 108 (90 Base) MCG/ACT inhaler INHALE 2 PUFFS BY MOUTH EVERY 6 HOURS AS NEEDED FOR WHEEZING OR SHORTNESS OF BREATH    Vitamin D, Ergocalciferol, (DRISDOL) 1.25 MG (50000 UNIT) CAPS capsule Take 1 capsule (50,000 Units total) by  mouth every 7 (seven) days.    [DISCONTINUED] ELIQUIS 5 MG TABS tablet TAKE 1 TABLET BY MOUTH TWICE DAILY CRUSH  TABLET  BEFORE  TAKING    FLUoxetine (PROZAC) 20 MG tablet Take 2 tablets (40 mg total) by mouth daily. Crush tablets before taking.    [DISCONTINUED] hydrochlorothiazide (HYDRODIURIL) 50 MG tablet Take 1 tablet (50 mg total) by mouth daily. Crush tablet before taking 07/13/2023: hypokalemia   No facility-administered medications prior to visit.    ROS Negative unless otherwise noted in HPI   Objective:     BP 130/84   Pulse 83   Ht 5' 3.5" (1.613 m)   Wt 200 lb 4 oz (90.8 kg)   LMP 02/07/2013   SpO2 97%   BMI 34.92 kg/m   Physical Exam Constitutional:      General: She is not in acute distress.    Appearance: Normal appearance.  HENT:     Head: Normocephalic and atraumatic.  Pulmonary:     Effort: Pulmonary effort is normal. No respiratory distress.  Musculoskeletal:     Cervical back: Normal range of motion.  Neurological:     General: No focal deficit present.     Mental Status: She is alert and oriented to person, place, and time. Mental status is at baseline.  Psychiatric:        Mood and Affect: Mood normal.        Thought  Content: Thought content normal.        Judgment: Judgment normal.      Assessment & Plan:  Nausea -     Ondansetron HCl; Take 1 tablet (4 mg total) by mouth every 8 (eight) hours as needed for nausea or vomiting.  Dispense: 20 tablet; Refill: 3  Hypokalemia -     Basic metabolic panel; Future  Essential hypertension Assessment & Plan: BP goal <130/80 given history of chronic kidney disease.  Continue amlodipine 5 mg daily, clonidine 0.1 mg twice daily.  Continue to monitor closely.  If blood pressure remains above goal, recommend to increase amlodipine dose.  Lisinopril was previously discontinued due to renal decline, and hydrochlorothiazide was discontinued due to hypokalemia.   Repeat BMP to check if hypokalemia has resolved  after discontinuing hydrochlorothiazide.  Start trial of ondansetron while awaiting response from oncologist suggesting antinausea medication.  Return in about 1 day (around 07/14/2023) for nonfasting blood work.    Melida Quitter, PA

## 2023-07-13 NOTE — Assessment & Plan Note (Signed)
 BP goal <130/80 given history of chronic kidney disease.  Continue amlodipine 5 mg daily, clonidine 0.1 mg twice daily.  Continue to monitor closely.  If blood pressure remains above goal, recommend to increase amlodipine dose.  Lisinopril was previously discontinued due to renal decline, and hydrochlorothiazide was discontinued due to hypokalemia.

## 2023-07-14 ENCOUNTER — Telehealth: Payer: Self-pay | Admitting: *Deleted

## 2023-07-14 NOTE — Telephone Encounter (Signed)
 Received in basket from [place], [place] regarding pt experiencing nausea and vomiting with unremarkable workup.  MD requesting pt to hold anastrozole x2 weeks and f/u in office to further discuss.  RN attempt x1 to contact pt.  No answer, LVM for pt to return call to the office.

## 2023-07-14 NOTE — Telephone Encounter (Signed)
 Per MD request, RN contacted pt and discussed stopping Anastrozole x 2 weeks and f/u in office for nausea and vomiting with unremarkable workup with PCP.  Pt educated, f/u appt scheduled, and verbalized understanding.

## 2023-07-15 ENCOUNTER — Ambulatory Visit: Payer: Medicare Other | Admitting: Nurse Practitioner

## 2023-07-16 ENCOUNTER — Telehealth: Payer: Self-pay | Admitting: Pulmonary Disease

## 2023-07-16 DIAGNOSIS — Z0389 Encounter for observation for other suspected diseases and conditions ruled out: Secondary | ICD-10-CM | POA: Diagnosis not present

## 2023-07-16 NOTE — Telephone Encounter (Signed)
 Call patient  Sleep study result  Date of study: 07/04/2023  Impression: Negative study for significant sleep disordered breathing with an AHI of 1.6  Recommendation:  Consider an in lab study if there remains significant concern for sleep disordered breathing, a negative home sleep study may be falsely negative if the pretest probability for sleep disordered breathing is high  Current study suggests absence of moderate to severe sleep disordered breathing.  Clinical follow-up of symptoms

## 2023-07-22 NOTE — Telephone Encounter (Signed)
 Patient is returning phone call. Patient phone number is [phone].

## 2023-07-22 NOTE — Telephone Encounter (Signed)
 Spoke with Lawson Fiscal. Pt is aware of Dr. Trena Platt note and verbalized understanding.  Pt is concerned about in lab sleep study recommendation. She states she will not be able to do that since she wont be able to sleep anywhere but her own household. Routing to Dr. Wynona Neat to be aware of pt concerns. Pt has upcoming appt with [facility].

## 2023-07-22 NOTE — Telephone Encounter (Signed)
 Lm for pt

## 2023-07-22 NOTE — Telephone Encounter (Signed)
 Your sleep study is negative for significant sleep apnea  The only options we have to be certain you do not have sleep apnea will be the in-lab sleep study.  Encourage weight loss measures, sleeping with the head of the bed at least 30 degrees, trying as best as possible to avoid supine sleep, optimize your sleep hygiene, measures that may continue to help your sleep and afford you good quality sleep.  If we are concerned about sleep apnea, we have to have a study that shows that you have significant sleep apnea for [place] to be able to treat it and for you to benefit from the treatment

## 2023-07-23 NOTE — Telephone Encounter (Signed)
 Pt is aware of below results/recommendations and voiced her understanding.  She would like to hold off on in lab study.  Nothing further needed.

## 2023-07-23 NOTE — Telephone Encounter (Signed)
 Lm for pt

## 2023-07-23 NOTE — Telephone Encounter (Signed)
 Patient is returning phone call. Patient phone number is [phone].

## 2023-08-05 ENCOUNTER — Inpatient Hospital Stay: Attending: Hematology and Oncology | Admitting: Hematology and Oncology
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
## 2023-08-07 NOTE — Telephone Encounter (Signed)
 Error

## 2023-08-11 ENCOUNTER — Other Ambulatory Visit: Payer: Self-pay | Admitting: Family Medicine

## 2023-08-11 DIAGNOSIS — I2699 Other pulmonary embolism without acute cor pulmonale: Secondary | ICD-10-CM

## 2023-08-12 ENCOUNTER — Telehealth: Payer: Self-pay | Admitting: Hematology and Oncology

## 2023-08-14 ENCOUNTER — Telehealth: Payer: Self-pay | Admitting: *Deleted

## 2023-08-14 NOTE — Telephone Encounter (Signed)
 Received call from pt stating she stopped hydrochlorothiazide and Anastrozole at the same time and nausea has dissipated.  Pt unsure what medication the nausea was related to.  Per MD pt needing to restart Anastrozole and f/u in 2-3 weeks. Appt scheduled, pt notified and verbalized understanding.

## 2023-08-15 ENCOUNTER — Other Ambulatory Visit: Payer: Self-pay | Admitting: Family Medicine

## 2023-08-15 DIAGNOSIS — I10 Essential (primary) hypertension: Secondary | ICD-10-CM

## 2023-08-17 ENCOUNTER — Ambulatory Visit (INDEPENDENT_AMBULATORY_CARE_PROVIDER_SITE_OTHER): Payer: Medicare Other | Admitting: Otolaryngology

## 2023-08-31 ENCOUNTER — Encounter: Payer: Self-pay | Admitting: Nurse Practitioner

## 2023-08-31 ENCOUNTER — Telehealth: Admitting: Nurse Practitioner

## 2023-08-31 VITALS — Ht 63.0 in | Wt 200.0 lb

## 2023-08-31 DIAGNOSIS — J309 Allergic rhinitis, unspecified: Secondary | ICD-10-CM | POA: Diagnosis not present

## 2023-08-31 DIAGNOSIS — Z6835 Body mass index (BMI) 35.0-35.9, adult: Secondary | ICD-10-CM

## 2023-08-31 DIAGNOSIS — R0609 Other forms of dyspnea: Secondary | ICD-10-CM

## 2023-08-31 DIAGNOSIS — E669 Obesity, unspecified: Secondary | ICD-10-CM

## 2023-08-31 DIAGNOSIS — R911 Solitary pulmonary nodule: Secondary | ICD-10-CM

## 2023-08-31 NOTE — Progress Notes (Unsigned)
 Patient ID: [name], female     DOB: [birth year], 62 y.o.      MRN: [id number]  Chief Complaint  Patient presents with   Follow-up    Pt is doing well    Virtual Visit via Video Note  I connected with [name] on 09/04/23 at  2:30 PM EDT by a video enabled telemedicine application and verified that I am speaking with the correct person using two identifiers.  Location: Patient: Home Provider: Office    I discussed the limitations of evaluation and management by telemedicine and the availability of in person appointments. The patient expressed understanding and agreed to proceed.  History of Present Illness: 63 year old female, never smoker followed for DOE and lung nodule.  She is a former patient of Dr. Zannie Baker and last seen in office 06/02/2023 by [facility] NP.  Past medical history significant for right breast cancer status postlumpectomy/XRT, paraesophageal hernia s/p repair/gastropexy 06/12/2022, hypertension, GERD, adrenal adenoma, RA, CKD, anxiety, depression, obesity, RLS.   TEST/EVENTS:  06/02/2022 CT chest: Mild atherosclerosis.  Status post right axillary node dissection.  Stable right upper lobe 7 mm nodule; no change since 2020.  Gallstones.  Right adrenal adenoma.  Hiatal hernia.  Postoperative changes in right breast. 04/13/2023 PFT: FVC 102, FEV1 108, ratio 81, TLC 104, DLCOunc 81, DLCOcor 79.  No BD. Hgb 14.3 (03/19/2023) 06/05/2023 CT chest: trace pericardial fluid. Atherosclerosis. Slightly patulous esophagus. Punctate reticulonodular calcifications in the RLL, stable. Consistent with old granulomatous disease. Dependent pleural thickening along the lower lobes b/l. Other calcified nodule. 1 nodule in middle lobe anteromedial measuring 7 mm, unchanged. Stable dating back to 2020.  07/04/2023 HST: negative for OSA, AHI 1.6/h; spO2 low 85% with average 95%   06/25/2022: OV with Dr. Jenny Baker.  Referred for cough and dyspnea with exertion.  Has some orthopnea.  Did have COVID-19  infection 2 years ago but was not hospitalized.  Also has a right middle lobe pulmonary nodule.  Father and grandfathers all died from lung cancer.  She never smoked.  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  Right upper lobe nodule is stable after 4 years of surveillance, likely benign.  Technically further surveillance not needed.  Does worry about family's history and personal history of breast cancer.  Did not recommend further surveillance.  Could consider navigational bronc if she places high value on diagnostic certainty.  Plan to discuss with family.  Trial albuterol .  PFT at follow-up   06/02/2023: OV with Brandi Eagleson NP Discussed the use of AI scribe software for clinical note transcription with the patient, who gave verbal consent to proceed. History of Present Illness   The patient presents with persistent shortness of breath, particularly with exertion and when lying flat. The patient reports waking up feeling short of breath and needing to sit up quickly to catch her breath. The patient also reports a chronic cough and constant throat clearing, but denies bringing up any sputum. She also reports feeling tired throughout the day. The patient reports sleeping approximately four hours a night, with some difficulty falling asleep and frequent awakenings.  The patient has been using an albuterol  inhaler when feeling out of breath, which provides slight relief. She reports becoming short of breath even with activities such as showering, sometimes needing to sit down to catch her breath. The patient denies any chest tightness or wheezing. No history of asthma.  The patient has a family history of cancer, with a father  and two sisters diagnosed with various types of cancer, including lung cancer. The patient had stage 1 breast cancer, which was surgically removed. She reports a nodule in the right upper lobe of the lung, which has not changed in size over the past four  years and considered benign. She does have a repeat CT chest ordered by her oncologist coming up.  The patient denies any leg swelling, sleep walking, or episodes of sleep paralysis. Unsure if she snores as she lives alone. She also denies any alcohol intake or excessive caffeine intake. The patient is not currently working. No issues with drowsy driving. No sleep aids.  08/31/2023: Today - follow up Patient presents today for follow up. Sleep study was negative for sleep apnea. Her echocardiogram was relatively unremarkable. Some mild MR but otherwise, pumping function was normal and no evidence of PH. Thus far, her workup for DOE has been unrevealing.  She's had a normal CT chest and normal PFTs aside from a mild diffusion defect. No evidence of anemia, cardiac disease or anemia. No significant change with shortness of breath. She does have a chronic cough with throat clearing. Non-purulent. No wheezing, fevers, night sweats, weight loss, hemoptysis. Overall, symptoms are stable.   Allergies  Allergen Reactions   Tramadol  Itching    "bugs crawling all over"   Immunization History  Administered Date(s) Administered   Tdap 04/29/2013, 05/21/2023   Past Medical History:  Diagnosis Date   Acid reflux    takes Zantac  and Omeprazole  daily   Anemia    Anxiety    takes Citaopram daily   Arthritis    right knee   Arthrosis    left thumb [facility]   Breast cancer (HCC)    Chronic back pain    DDD   Clotting disorder (HCC)    hx of blood clot following knee scope, pt reports hx blood clot in her lung   Depression    Dyspnea on exertion    Fibromyalgia    History of bronchitis 3+yrs ago   Hyperlipidemia    takes Atorvastatin  daily   Hypertension    takes Lisinopril  and HCTZ daily   Insomnia    takes Elavil  nightly as needed   Paraesophageal hernia    Personal history of radiation therapy     Tobacco History: Social History   Tobacco Use  Smoking Status Never   Passive exposure: Never   Smokeless Tobacco Never   Counseling given: Not Answered   Outpatient Medications Prior to Visit  Medication Sig Dispense Refill   amLODipine  (NORVASC ) 5 MG tablet Take 1 tablet (5 mg total) by mouth daily. 90 tablet 1   anastrozole  (ARIMIDEX ) 1 MG tablet Take 1 tablet (1 mg total) by mouth daily. 90 tablet 3   buPROPion  (WELLBUTRIN  SR) 150 MG 12 hr tablet Take 1 tablet (150 mg total) by mouth in the morning. 90 tablet 1   cloNIDine  (CATAPRES ) 0.1 MG tablet Take 1 tablet by mouth twice daily 180 tablet 0   desloratadine  (CLARINEX ) 5 MG tablet Take 1 tablet (5 mg total) by mouth daily. 90 tablet 3   ELIQUIS  5 MG TABS tablet TAKE 1 TABLET BY MOUTH TWICE DAILY CRUSH  TABLET  BEFORE  TAKING 60 tablet 0   famotidine  (PEPCID ) 20 MG tablet Take 1 tablet (20 mg total) by mouth 2 (two) times daily. 30 tablet 1   fluticasone  (FLONASE ) 50 MCG/ACT nasal spray Place 2 sprays into both nostrils daily. 16 g 6   metoprolol   succinate (TOPROL -XL) 25 MG 24 hr tablet TAKE 1 TABLET BY MOUTH ONCE DAILY SPLIT  TABLET  IN  HALF  PRIOR  TO  TAKING 90 tablet 0   ondansetron  (ZOFRAN ) 4 MG tablet Take 1 tablet (4 mg total) by mouth every 8 (eight) hours as needed for nausea or vomiting. 20 tablet 3   VENTOLIN  HFA 108 (90 Base) MCG/ACT inhaler INHALE 2 PUFFS BY MOUTH EVERY 6 HOURS AS NEEDED FOR WHEEZING OR SHORTNESS OF BREATH 18 g 0   Vitamin D , Ergocalciferol , (DRISDOL ) 1.25 MG (50000 UNIT) CAPS capsule Take 1 capsule (50,000 Units total) by mouth every 7 (seven) days. 12 capsule 0   FLUoxetine  (PROZAC ) 20 MG tablet Take 2 tablets (40 mg total) by mouth daily. Crush tablets before taking. 180 tablet 0   No facility-administered medications prior to visit.     Review of Systems:   Constitutional: No weight loss or gain, night sweats, fevers, chills,or lassitude. +fatigue  HEENT: No difficulty swallowing, tooth/dental problems, or sore throat. No sneezing, itching, ear ache. +morning headaches, nasal congestion,  postnasal drainage CV:  +orthopnea. No chest pain, PND, swelling in lower extremities, anasarca, dizziness, palpitations, syncope Resp: +shortness of breath with exertion; dry cough. No excess mucus or change in color of mucus. No hemoptysis. No wheezing.  No chest wall deformity GI:  No heartburn, indigestion, abdominal pain, nausea, vomiting, diarrhea, change in bowel habits, loss of appetite, bloody stools.  GU: No dysuria, change in color of urine, urgency or frequency.  No flank pain, no hematuria  Skin: No rash, lesions, ulcerations MSK:  No joint pain or swelling. Neuro: No dizziness or lightheadedness.  Psych: No depression or anxiety. Mood stable.  Observations/Objective: Patient is well-developed, well-nourished in no acute distress.  Resting comfortably at home.  No labored breathing.  Speech is clear and coherent with logical content.  Patient is alert and oriented at baseline.   Assessment and Plan: DOE (dyspnea on exertion) Intermittent dyspnea with exertion and orthopnea. Normal PFTs and lung volumes. Mild diffusion defect for which workup has been unremarkable. No evidence of ILD on imaging, no PH or cardiac disease aside from mild MR, nl hgb. Prior walk test without hypoxia on room air. No OSA on HST. She does not have a significant response to SABA use per her report. At this point, she does not appear to have an underlying pulmonary etiology following extensive testing. Likely component of obesity and deconditioning. Encouraged her to work on graded exercises and healthy weight loss measures. If symptoms persist, could consider cardiopulmonary exercise testing. Advised her to monitor and follow up with us  as needed, if symptoms fail to improve or worsen.  Patient Instructions  All of your testing we have done so far has been reassuring. Your lung function is normal. You do not have any evidence of significant cardiac disease or pulmonary hypertension on your echocardiogram.  You did have some mild valve leakage, which should not cause any symptoms but can be monitored by your primary care provider or heart doctor Your home sleep study was negative  Recent CT chest looked good with stable lung nodule since 2020 - no dedicated follow up needed   Encourage you to work on graded exercises and healthy weight loss measures. Monitor breathing with this   Follow up with us  as needed. If symptoms do not improve or worsen, please contact office for sooner follow up or seek emergency care.    Obesity (BMI 35.0-39.9 without comorbidity) BMI 35. Healthy  weight loss encouraged   Allergic rhinitis Continue allergy regimen. Follow up with PCP as scheduled   Pulmonary nodule, right 7 mm stable nodule, considered benign based on stability. Family history of lung cancer. She is a never smoker. No symptoms of weight loss, anorexia, hemoptysis. No dedicated follow up necessary. Follow up with oncology as scheduled      I discussed the assessment and treatment plan with the patient. The patient was provided an opportunity to ask questions and all were answered. The patient agreed with the plan and demonstrated an understanding of the instructions.   The patient was advised to call back or seek an in-person evaluation if the symptoms worsen or if the condition fails to improve as anticipated.  I provided 25 minutes of non-face-to-face time during this encounter.   Roetta Clarke, NP

## 2023-09-04 ENCOUNTER — Encounter: Payer: Self-pay | Admitting: Nurse Practitioner

## 2023-09-04 DIAGNOSIS — J309 Allergic rhinitis, unspecified: Secondary | ICD-10-CM | POA: Insufficient documentation

## 2023-09-04 NOTE — Assessment & Plan Note (Signed)
 Intermittent dyspnea with exertion and orthopnea. Normal PFTs and lung volumes. Mild diffusion defect for which workup has been unremarkable. No evidence of ILD on imaging, no PH or cardiac disease aside from mild MR, nl hgb. Prior walk test without hypoxia on room air. No OSA on HST. She does not have a significant response to SABA use per her report. At this point, she does not appear to have an underlying pulmonary etiology following extensive testing. Likely component of obesity and deconditioning. Encouraged her to work on graded exercises and healthy weight loss measures. If symptoms persist, could consider cardiopulmonary exercise testing. Advised her to monitor and follow up with us  as needed, if symptoms fail to improve or worsen.  Patient Instructions  All of your testing we have done so far has been reassuring. Your lung function is normal. You do not have any evidence of significant cardiac disease or pulmonary hypertension on your echocardiogram. You did have some mild valve leakage, which should not cause any symptoms but can be monitored by your primary care provider or heart doctor Your home sleep study was negative  Recent CT chest looked good with stable lung nodule since 2020 - no dedicated follow up needed   Encourage you to work on graded exercises and healthy weight loss measures. Monitor breathing with this   Follow up with us  as needed. If symptoms do not improve or worsen, please contact office for sooner follow up or seek emergency care.

## 2023-09-04 NOTE — Assessment & Plan Note (Signed)
 BMI 35. Healthy weight loss encouraged.

## 2023-09-04 NOTE — Patient Instructions (Signed)
 All of your testing we have done so far has been reassuring. Your lung function is normal. You do not have any evidence of significant cardiac disease or pulmonary hypertension on your echocardiogram. You did have some mild valve leakage, which should not cause any symptoms but can be monitored by your primary care provider or heart doctor Your home sleep study was negative  Recent CT chest looked good with stable lung nodule since 2020 - no dedicated follow up needed   Encourage you to work on graded exercises and healthy weight loss measures. Monitor breathing with this   Follow up with us  as needed. If symptoms do not improve or worsen, please contact office for sooner follow up or seek emergency care.

## 2023-09-04 NOTE — Assessment & Plan Note (Signed)
 7 mm stable nodule, considered benign based on stability. Family history of lung cancer. She is a never smoker. No symptoms of weight loss, anorexia, hemoptysis. No dedicated follow up necessary. Follow up with oncology as scheduled

## 2023-09-04 NOTE — Assessment & Plan Note (Signed)
 Continue allergy regimen. Follow up with PCP as scheduled

## 2023-09-07 ENCOUNTER — Encounter: Payer: Self-pay | Admitting: Dermatology

## 2023-09-07 ENCOUNTER — Ambulatory Visit: Admitting: Dermatology

## 2023-09-07 VITALS — BP 124/99 | HR 76

## 2023-09-07 DIAGNOSIS — D492 Neoplasm of unspecified behavior of bone, soft tissue, and skin: Secondary | ICD-10-CM

## 2023-09-07 DIAGNOSIS — L820 Inflamed seborrheic keratosis: Secondary | ICD-10-CM

## 2023-09-07 DIAGNOSIS — D229 Melanocytic nevi, unspecified: Secondary | ICD-10-CM

## 2023-09-07 DIAGNOSIS — W908XXA Exposure to other nonionizing radiation, initial encounter: Secondary | ICD-10-CM

## 2023-09-07 DIAGNOSIS — L570 Actinic keratosis: Secondary | ICD-10-CM | POA: Diagnosis not present

## 2023-09-07 DIAGNOSIS — L578 Other skin changes due to chronic exposure to nonionizing radiation: Secondary | ICD-10-CM

## 2023-09-07 DIAGNOSIS — L814 Other melanin hyperpigmentation: Secondary | ICD-10-CM

## 2023-09-07 DIAGNOSIS — Z1283 Encounter for screening for malignant neoplasm of skin: Secondary | ICD-10-CM

## 2023-09-07 DIAGNOSIS — D1801 Hemangioma of skin and subcutaneous tissue: Secondary | ICD-10-CM

## 2023-09-07 DIAGNOSIS — L821 Other seborrheic keratosis: Secondary | ICD-10-CM

## 2023-09-07 DIAGNOSIS — Z808 Family history of malignant neoplasm of other organs or systems: Secondary | ICD-10-CM

## 2023-09-07 DIAGNOSIS — D485 Neoplasm of uncertain behavior of skin: Secondary | ICD-10-CM

## 2023-09-07 NOTE — Patient Instructions (Addendum)
 Important Information  Due to recent changes in healthcare laws, you may see results of your pathology and/or laboratory studies on MyChart before the doctors have had a chance to review them. We understand that in some cases there may be results that are confusing or concerning to you. Please understand that not all results are received at the same time and often the doctors may need to interpret multiple results in order to provide you with the best plan of care or course of treatment. Therefore, we ask that you please give us  2 business days to thoroughly review all your results before contacting the office for clarification. Should we see a critical lab result, you will be contacted sooner.   If You Need Anything After Your Visit  If you have any questions or concerns for your doctor, please call our main line at [phone] If no one answers, please leave a voicemail as directed and we will return your call as soon as possible. Messages left after 4 pm will be answered the following business day.   You may also send us  a message via MyChart. We typically respond to MyChart messages within 1-2 business days.  For prescription refills, please ask your pharmacy to contact our office. Our fax number is [phone].  If you have an urgent issue when the clinic is closed that cannot wait until the next business day, you can page your doctor at the number below.    Please note that while we do our best to be available for urgent issues outside of office hours, we are not available 24/7.   If you have an urgent issue and are unable to reach us , you may choose to seek medical care at your doctor's office, retail clinic, urgent care center, or emergency room.  If you have a medical emergency, please immediately call 911 or go to the emergency department. In the event of inclement weather, please call our main line at [phone] for an update on the status of any delays or  closures.  Dermatology Medication Tips: Please keep the boxes that topical medications come in in order to help keep track of the instructions about where and how to use these. Pharmacies typically print the medication instructions only on the boxes and not directly on the medication tubes.   If your medication is too expensive, please contact our office at [phone] or send us  a message through MyChart.   We are unable to tell what your co-pay for medications will be in advance as this is different depending on your insurance coverage. However, we may be able to find a substitute medication at lower cost or fill out paperwork to get insurance to cover a needed medication.   If a prior authorization is required to get your medication covered by your insurance company, please allow us  1-2 business days to complete this process.  Drug prices often vary depending on where the prescription is filled and some pharmacies may offer cheaper prices.  The website www.goodrx.com contains coupons for medications through different pharmacies. The prices here do not account for what the cost may be with help from insurance (it may be cheaper with your insurance), but the website can give you the price if you did not use any insurance.  - You can print the associated coupon and take it with your prescription to the pharmacy.  - You may also stop by our office during regular business hours and pick up a GoodRx coupon card.  - If  you need your prescription sent electronically to a different pharmacy, notify our office through [facility] or by phone at [phone]    Skin Education :   I counseled the patient regarding the following: Sun screen (SPF 30 or greater) should be applied during peak UV exposure (between 10am and 2pm) and reapplied after exercise or swimming.  The ABCDEs of melanoma were reviewed with the patient, and the importance of monthly self-examination of moles was emphasized.  Should any moles change in shape or color, or itch, bleed or burn, pt will contact our office for evaluation sooner then their interval appointment.  Plan: Sunscreen Recommendations I recommended a broad spectrum sunscreen with a SPF of 30 or higher. I explained that SPF 30 sunscreens block approximately 97 percent of the sun's harmful rays. Sunscreens should be applied at least 15 minutes prior to expected sun exposure and then every 2 hours after that as long as sun exposure continues. If swimming or exercising sunscreen should be reapplied every 45 minutes to an hour after getting wet or sweating. One ounce, or the equivalent of a shot glass full of sunscreen, is adequate to protect the skin not covered by a bathing suit. I also recommended a lip balm with a sunscreen as well. Sun protective clothing can be used in lieu of sunscreen but must be worn the entire time you are exposed to the sun's rays. Liquid nitrogen was applied for 10-12 seconds to the skin lesion and the expected blistering or scabbing reaction explained. Do not pick at the area. Patient reminded to expect hypopigmented scars from the procedure. Return if lesion fails to fully resolve. For areas treated with Liquid Nitrogen:  Keep clean with soap and water .  Apply Vaseline or Aquaphor twice daily.  Patient Handout: Wound Care for Skin Biopsy Site  Taking Care of Your Skin Biopsy Site  Proper care of the biopsy site is essential for promoting healing and minimizing scarring. This handout provides instructions on how to care for your biopsy site to ensure optimal recovery.  1. Cleaning the Wound:  Clean the biopsy site daily with gentle soap and water . Gently pat the area dry with a clean, soft towel. Avoid harsh scrubbing or rubbing the area, as this can irritate the skin and delay healing.  2. Applying Aquaphor and Bandage:  After cleaning the wound, apply a thin layer of Aquaphor ointment to the biopsy site. Cover the area  with a sterile bandage to protect it from dirt, bacteria, and friction. Change the bandage daily or as needed if it becomes soiled or wet.  3. Continued Care for One Week:  Repeat the cleaning, Aquaphor application, and bandaging process daily for one week following the biopsy procedure. Keeping the wound clean and moist during this initial healing period will help prevent infection and promote optimal healing.  4. Massaging Aquaphor into the Area:  ---After one week, discontinue the use of bandages but continue to apply Aquaphor to the biopsy site. ----Gently massage the Aquaphor into the area using circular motions. ---Massaging the skin helps to promote circulation and prevent the formation of scar tissue.   Additional Tips:  Avoid exposing the biopsy site to direct sunlight during the healing process, as this can cause hyperpigmentation or worsen scarring. If you experience any signs of infection, such as increased redness, swelling, warmth, or drainage from the wound, contact your healthcare provider immediately. Follow any additional instructions provided by your healthcare provider for caring for the biopsy site and  managing any discomfort. Conclusion:  Taking proper care of your skin biopsy site is crucial for ensuring optimal healing and minimizing scarring. By following these instructions for cleaning, applying Aquaphor, and massaging the area, you can promote a smooth and successful recovery. If you have any questions or concerns about caring for your biopsy site, don't hesitate to contact your healthcare provider for guidance.

## 2023-09-07 NOTE — Progress Notes (Signed)
 New Patient Visit   Subjective  [name] is a 63 y.o. female who presents for the following: Skin Cancer Screening and Full Body Skin Exam. No hx of skin cancer. Family of MM- siblings  The patient presents for Total-Body Skin Exam (TBSE) for skin cancer screening and mole check. The patient has spots, moles and lesions to be evaluated, some may be new or changing.   The following portions of the chart were reviewed this encounter and updated as appropriate: medications, allergies, medical history  Review of Systems:  No other skin or systemic complaints except as noted in HPI or Assessment and Plan.  Objective  Well appearing patient in no apparent distress; mood and affect are within normal limits.  A full examination was performed including scalp, head, eyes, ears, nose, lips, neck, chest, axillae, abdomen, back, buttocks, bilateral upper extremities, bilateral lower extremities, hands, feet, fingers, toes, fingernails, and toenails. All findings within normal limits unless otherwise noted below.   Relevant physical exam findings are noted in the Assessment and Plan.  Left Lower Leg - Anterior 1.2 cm firm skin colored to brown plaque  Head - Anterior (Face), Right Breast Erythematous thin papules/macules with gritty scale.   Assessment & Plan   SKIN CANCER SCREENING PERFORMED TODAY.  ACTINIC DAMAGE - Chronic condition, secondary to cumulative UV/sun exposure - diffuse scaly erythematous macules with underlying dyspigmentation - Recommend daily broad spectrum sunscreen SPF 30+ to sun-exposed areas, reapply every 2 hours as needed.  - Staying in the shade or wearing long sleeves, sun glasses (UVA+UVB protection) and wide brim hats (4-inch brim around the entire circumference of the hat) are also recommended for sun protection.  - Call for new or changing lesions.  LENTIGINES, SEBORRHEIC KERATOSES, HEMANGIOMAS - Benign normal skin lesions - Benign-appearing - Call for any  changes  MELANOCYTIC NEVI - Tan-brown and/or pink-flesh-colored symmetric macules and papules - Benign appearing on exam today - Observation - Call clinic for new or changing moles - Recommend daily use of broad spectrum spf 30+ sunscreen to sun-exposed areas.   Family history of melanoma - Recommend yearly TBSEs NEOPLASM OF UNCERTAIN BEHAVIOR OF SKIN Left Lower Leg - Anterior Skin / nail biopsy Type of biopsy: tangential   Informed consent: discussed and consent obtained   Timeout: patient name, date of birth, surgical site, and procedure verified   Procedure prep:  Patient was prepped and draped in usual sterile fashion Prep type:  Isopropyl alcohol Anesthesia: the lesion was anesthetized in a standard fashion   Anesthetic:  1% lidocaine  w/ epinephrine  1-100,000 buffered w/ 8.4% NaHCO3 Instrument used: DermaBlade   Hemostasis achieved with: aluminum chloride   Outcome: patient tolerated procedure well   Post-procedure details: sterile dressing applied and wound care instructions given   Dressing type: bandage and petrolatum    Specimen 1 - Surgical pathology Differential Diagnosis: r/o DF vs sk vs scar vs other  Check Margins: No AK (ACTINIC KERATOSIS) (2) Head - Anterior (Face), Right Breast Destruction of lesion - Right Breast Complexity: simple   Destruction method: cryotherapy   Informed consent: discussed and consent obtained   Timeout:  patient name, date of birth, surgical site, and procedure verified Lesion destroyed using liquid nitrogen: Yes   Region frozen until ice ball extended beyond lesion: Yes   Cryotherapy cycles:  2 Outcome: patient tolerated procedure well with no complications   Post-procedure details: wound care instructions given   ACTINIC SKIN DAMAGE   MULTIPLE BENIGN NEVI   CHERRY  ANGIOMA   SEBORRHEIC KERATOSES   LENTIGINES    Return in about 6 months (around 03/08/2024) for 6-50months TBSC.  I, Haig Levan, Surg Tech III, am  acting as scribe for Deneise Finlay, MD.   Documentation: I have reviewed the above documentation for accuracy and completeness, and I agree with the above.  Deneise Finlay, MD

## 2023-09-08 ENCOUNTER — Other Ambulatory Visit: Payer: Self-pay | Admitting: Family Medicine

## 2023-09-08 DIAGNOSIS — I10 Essential (primary) hypertension: Secondary | ICD-10-CM

## 2023-09-09 ENCOUNTER — Other Ambulatory Visit: Payer: Self-pay | Admitting: Physician Assistant

## 2023-09-09 DIAGNOSIS — F32A Depression, unspecified: Secondary | ICD-10-CM

## 2023-09-10 ENCOUNTER — Other Ambulatory Visit: Payer: Self-pay | Admitting: Family Medicine

## 2023-09-10 DIAGNOSIS — F32A Depression, unspecified: Secondary | ICD-10-CM

## 2023-09-10 LAB — SURGICAL PATHOLOGY

## 2023-09-10 NOTE — Telephone Encounter (Signed)
 Copied from CRM [id number]. Topic: Clinical - Prescription Issue >> Sep 10, 2023  1:34 PM Everlene Hobby D wrote: Patient wants to know if she get a refill for this -FLUoxetine  (PROZAC ) 20 MG tablet Says she has tried to get it refilled but hasn't and thinks the hospital changed her prescriptions   [facility] - [place], [place] - [address] [place] [address] Phone: [phone] Fax: [phone] Hours: Not open 24 hours

## 2023-09-11 MED ORDER — FLUOXETINE HCL 20 MG PO TABS: 40.0000 mg | ORAL_TABLET | Freq: Every day | ORAL | 0 refills | Status: DC

## 2023-09-15 ENCOUNTER — Telehealth: Payer: Self-pay

## 2023-09-15 NOTE — Telephone Encounter (Signed)
 Copied from CRM [id number]. Topic: Clinical - Prescription Issue >> Sep 15, 2023 11:14 AM Star East wrote: Reason for CRM: FLUoxetine  (PROZAC ) 20 MG tablet- insurance will not cover tablets, only capsules per pharmacy- [phone]

## 2023-09-16 ENCOUNTER — Other Ambulatory Visit: Payer: Self-pay | Admitting: Family Medicine

## 2023-09-16 DIAGNOSIS — I2699 Other pulmonary embolism without acute cor pulmonale: Secondary | ICD-10-CM

## 2023-09-16 MED ORDER — FLUOXETINE HCL 20 MG PO CAPS: 40.0000 mg | ORAL_CAPSULE | Freq: Every day | ORAL | 1 refills | Status: DC

## 2023-09-18 ENCOUNTER — Other Ambulatory Visit: Payer: Self-pay | Admitting: Family Medicine

## 2023-09-18 DIAGNOSIS — F32A Depression, unspecified: Secondary | ICD-10-CM

## 2023-09-22 ENCOUNTER — Encounter: Payer: Self-pay | Admitting: Orthopaedic Surgery

## 2023-09-22 ENCOUNTER — Inpatient Hospital Stay: Attending: Hematology and Oncology | Admitting: Hematology and Oncology

## 2023-09-22 ENCOUNTER — Ambulatory Visit (INDEPENDENT_AMBULATORY_CARE_PROVIDER_SITE_OTHER): Admitting: Orthopaedic Surgery

## 2023-09-22 DIAGNOSIS — M65312 Trigger thumb, left thumb: Secondary | ICD-10-CM

## 2023-09-22 DIAGNOSIS — C50411 Malignant neoplasm of upper-outer quadrant of right female breast: Secondary | ICD-10-CM | POA: Diagnosis not present

## 2023-09-22 DIAGNOSIS — Z170 Estrogen receptor positive status [ER+]: Secondary | ICD-10-CM

## 2023-09-22 MED ORDER — LIDOCAINE HCL 1 % IJ SOLN
0.3000 mL | INTRAMUSCULAR | Status: AC | PRN
  Administered 2023-09-22: .3 mL

## 2023-09-22 MED ORDER — METHYLPREDNISOLONE ACETATE 40 MG/ML IJ SUSP
13.3300 mg | INTRAMUSCULAR | Status: AC | PRN
  Administered 2023-09-22: 13.33 mg

## 2023-09-22 MED ORDER — BUPIVACAINE HCL 0.5 % IJ SOLN
0.3300 mL | INTRAMUSCULAR | Status: AC | PRN
Start: 2023-09-22 — End: 2023-09-22
  Administered 2023-09-22: .33 mL
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
## 2023-09-22 NOTE — Progress Notes (Signed)
 Office Visit Note   Patient: [name]           Date of Birth: [birth year]           MRN: [id number] Visit Date: 09/22/2023              Requested by: Noreene Bearded, PA [address] [place],  [place] [address] PCP: Noreene Bearded, PA   Assessment & Plan: Visit Diagnoses:  1. Trigger thumb, left thumb     Plan: History of Present Illness [name] is a 63 year old female who presents with a catching and painful thumb.  She experiences significant pain and catching in her thumb, which worsens by the end of the day and is not alleviated by Tylenol  or other over-the-counter medications. 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 She has not received any injections for this condition.  Physical Exam MUSCULOSKELETAL: Tenderness at the thumb A1 pulley. Thumb catching and locking.  Assessment and Plan Trigger thumb left Trigger thumb with pain and locking due to tendon sheath inflammation. Advised that injections can be curative, unlike arthritis.  - trigger thumb injection administered.  Follow-Up Instructions: No follow-ups on file.   Orders:  No orders of the defined types were placed in this encounter.  No orders of the defined types were placed in this encounter.     Procedures: Hand/UE Inj: L thumb A1 for trigger finger on 09/22/2023 1:39 PM Indications: pain Details: 25 G needle Medications: 0.3 mL lidocaine  1 %; 0.33 mL bupivacaine  0.5 %; 13.33 mg methylPREDNISolone  acetate 40 MG/ML Outcome: tolerated well, no immediate complications Consent was given by the patient. Patient was prepped and draped in the usual sterile fashion.     Subjective: Chief Complaint  Patient presents with   Left Hand - Follow-up    Thumb pain    HPI  Review of Systems  Constitutional: Negative.   HENT: Negative.    Eyes: Negative.   Respiratory:  Negative.    Cardiovascular: Negative.   Endocrine: Negative.   Musculoskeletal: Negative.   Neurological: Negative.   Hematological: Negative.   Psychiatric/Behavioral: Negative.    All other systems reviewed and are negative.    Objective: Vital Signs: LMP 02/07/2013   Physical Exam Vitals and nursing note reviewed.  Constitutional:      Appearance: She is well-developed.  HENT:     Head: Atraumatic.     Nose: Nose normal.  Eyes:     Extraocular Movements: Extraocular movements intact.  Cardiovascular:     Pulses: Normal pulses.  Pulmonary:     Effort: Pulmonary effort is normal.  Abdominal:     Palpations: Abdomen is soft.  Musculoskeletal:     Cervical back: Neck supple.  Skin:    General: Skin is warm.     Capillary Refill: Capillary refill takes less than 2 seconds.  Neurological:     Mental Status: She is alert. Mental status is at baseline.  Psychiatric:        Behavior: Behavior normal.        Thought Content: Thought content normal.        Judgment: Judgment normal.     PMFS History: Patient Active Problem List   Diagnosis Date Noted   Trigger thumb, left thumb 09/22/2023   Allergic rhinitis 09/04/2023   Nausea 03/02/2023   Palpitations 03/02/2023   Vitamin  D deficiency 12/29/2022   Multiple nevi 12/29/2022   S/P laparoscopic cholecystectomy 07/29/2022   Depression 06/23/2022   Gallstones 06/23/2022   Pulmonary nodule, right 06/23/2022   H/O hiatal hernia 06/12/2022   Easy bruising 03/27/2021   Bleeds easily (HCC) 03/27/2021   Malignant neoplasm of upper-outer quadrant of right breast in female, estrogen receptor positive (HCC) 03/26/2021   S/P repair of paraesophageal hernia 12/17/2020   B12 deficiency 11/02/2019   Iron deficiency anemia 08/22/2019   Chronic joint pain 08/22/2019   Stage 3 chronic kidney disease (HCC) 08/22/2019   Anxiety 08/22/2019   Obesity (BMI 35.0-39.9 without comorbidity) 02/28/2019   Normocytic anemia, not due to  blood loss 02/28/2019   Restless legs syndrome (RLS) 06/30/2018   Arthrosis of first carpometacarpal joint 02/11/2018   Primary osteoarthritis of first carpometacarpal joint of left hand    Tinea versicolor 11/16/2017   Chronic right shoulder pain 09/02/2017   Fibromyalgia 08/11/2017   Other fatigue 06/23/2016   Insomnia 06/23/2016   Rheumatoid arthritis (HCC) 06/23/2016   Status post right partial knee replacement 09/15/2014   Status post total knee replacement using cement 06/08/2014   Schatzki's ring    Dysphagia 05/26/2014   Hiatal hernia 05/01/2014   Adrenal adenoma 05/01/2014   Metatarsalgia of both feet 01/12/2014   Bilateral leg pain 01/11/2014   Bilateral edema of lower extremity 01/11/2014   Other osteoarthritis of spine, thoracolumbar region 12/26/2013   Hypercholesteremia 05/19/2013   GERD (gastroesophageal reflux disease) 04/13/2013   Essential hypertension 04/13/2013   DOE (dyspnea on exertion) 04/13/2013   Past Medical History:  Diagnosis Date   Acid reflux    takes Zantac  and Omeprazole  daily   Anemia    Anxiety    takes Citaopram daily   Arthritis    right knee   Arthrosis    left thumb CMC   Breast cancer (HCC)    Chronic back pain    DDD   Clotting disorder (HCC)    hx of blood clot following knee scope, pt reports hx blood clot in her lung   Depression    Dyspnea on exertion    Fibromyalgia    History of bronchitis 3+yrs ago   Hyperlipidemia    takes Atorvastatin  daily   Hypertension    takes Lisinopril  and HCTZ daily   Insomnia    takes Elavil  nightly as needed   Paraesophageal hernia    Personal history of radiation therapy     Family History  Problem Relation Age of Onset   Stroke Mother    Lung cancer Father 45   GI problems Father    Prostate cancer Father    Cancer Sister        breast   Breast cancer Sister 55   Lung cancer Maternal Grandfather    Lung cancer Paternal Grandfather    Diabetes Son    Colon cancer Neg Hx     Esophageal cancer Neg Hx    Pancreatic cancer Neg Hx     Past Surgical History:  Procedure Laterality Date   BREAST BIOPSY Left    BREAST LUMPECTOMY     BREAST LUMPECTOMY WITH RADIOACTIVE SEED AND SENTINEL LYMPH NODE BIOPSY Right 06/03/2021   Procedure: RIGHT BREAST LUMPECTOMY WITH RADIOACTIVE SEED AND SENTINEL LYMPH NODE BIOPSY;  Surgeon: Caralyn Chandler, MD;  Location: [facility];  Service: General;  Laterality: Right;   BUNIONECTOMY Right 02/18/2018   Procedure: Julianne Octave;  Surgeon: Dot Gazella, DPM;  Location:  [facility];  Service: Podiatry;  Laterality: Right;   CAPSULOTOMY Bilateral 02/18/2018   Procedure: CAPSULOTOMY MPJ RELEASE JOINT 2N BILATERAL;  Surgeon: Dot Gazella, DPM;  Location: [facility];  Service: Podiatry;  Laterality: Bilateral;   CARPOMETACARPEL SUSPENSION PLASTY Left 01/27/2018   Procedure: LEFT THUMB ligament reconstruction and tendon interposition;  Surgeon: Wes Hamman, MD;  Location: [facility];  Service: Orthopedics;  Laterality: Left;   CARPOMETACARPEL SUSPENSION PLASTY Left 03/07/2020   Procedure: REVISION LEFT THUMB CARPOMETACARPAL (CMC) ARTHROPLASTY;  Surgeon: Wes Hamman, MD;  Location: [facility];  Service: Orthopedics;  Laterality: Left;   CHOLECYSTECTOMY N/A 07/29/2022   Procedure: LAPAROSCOPIC CHOLECYSTECTOMY WITH  ICG DYE;  Surgeon: Enid Harry, MD;  Location: [facility] OR;  Service: General;  Laterality: N/A;  RNFA  ICG DYE  TAP BLOCK   CHONDROPLASTY Right 06/28/2014   Procedure: CHONDROPLASTY;  Surgeon: Edison Gore, MD;  Location: [facility];  Service: Orthopedics;  Laterality: Right;   COLONOSCOPY N/A 05/29/2014   Procedure: COLONOSCOPY;  Surgeon: Suzette Espy, MD;  Location: [facility];  Service: Endoscopy;  Laterality: N/A;  215pm- Pt is working until 12:00 so she can't come any earlier   ESOPHAGOGASTRODUODENOSCOPY N/A 05/29/2014   Procedure:  ESOPHAGOGASTRODUODENOSCOPY (EGD);  Surgeon: Suzette Espy, MD;  [facility]: [facility];  Service: Endoscopy;  Laterality: N/A;   ESOPHAGOGASTRODUODENOSCOPY N/A 12/17/2020   Procedure: ESOPHAGOGASTRODUODENOSCOPY (EGD);  Surgeon: Hilarie Lovely, MD;  Location: [facility] OR;  Service: Thoracic;  Laterality: N/A;   ESOPHAGOGASTRODUODENOSCOPY N/A 06/12/2022   Procedure: ESOPHAGOGASTRODUODENOSCOPY (EGD);  Surgeon: Hilarie Lovely, MD;  Location: [facility] OR;  Service: Thoracic;  Laterality: N/A;   ESOPHAGOGASTRODUODENOSCOPY N/A 11/19/2022   Procedure: ESOPHAGOGASTRODUODENOSCOPY (EGD) WITH SAVARY DILATION;  Surgeon: Hilarie Lovely, MD;  Location: [facility];  Service: Thoracic;  Laterality: N/A;   GANGLION CYST EXCISION Left 01/13/2002   HAMMER TOE SURGERY Bilateral 02/18/2018   Procedure: HAMMER TOE CORRECTION2ND BILATERAL;  Surgeon: Dot Gazella, DPM;  Location: [facility];  Service: Podiatry;  Laterality: Bilateral;   HERNIA REPAIR     IRRIGATION AND DEBRIDEMENT ABSCESS Right 09/08/2021   Procedure: IRRIGATION AND DEBRIDEMENT RIGHT BREAST ABSCESS;  Surgeon: Dareen Ebbing, MD;  Location: [facility];  Service: General;  Laterality: Right;   KNEE ARTHROSCOPY WITH MEDIAL MENISECTOMY Right 06/28/2014   Procedure: RIGHT KNEE ARTHROSCOPY WITH PARTIAL MEDIAL MENISCECTOMY AND CHONDROPLASTY;  Surgeon: Edison Gore, MD;  Location: [facility];  Service: Orthopedics;  Laterality: Right;   MALONEY DILATION N/A 05/29/2014   Procedure: Londa Rival DILATION;  Surgeon: Suzette Espy, MD;  [facility];  Service: Endoscopy;  Laterality: N/A;   PARTIAL KNEE ARTHROPLASTY Right 09/15/2014   Procedure: RIGHT UNICOMPARTMENTAL KNEE ARTHROPLASTY;  Surgeon: Wes Hamman, MD;  Location: [facility];  Service: Orthopedics;  Laterality: Right;   SHOULDER ARTHROSCOPY Right    TOTAL KNEE ARTHROPLASTY Left 04/08/2004   XI ROBOTIC ASSISTED HIATAL HERNIA REPAIR N/A 06/12/2022   Procedure: XI ROBOTIC ASSISTED HIATAL  HERNIA REPAIR;  Surgeon: Hilarie Lovely, MD;  Location: [facility];  Service: Thoracic;  Laterality: N/A;   XI ROBOTIC ASSISTED PARAESOPHAGEAL HERNIA REPAIR N/A 12/17/2020   Procedure: XI ROBOTIC ASSISTED Laparoscopy PARAESOPHAGEAL HERNIA REPAIR WITH FUNDOPLICATION;  Surgeon: Hilarie Lovely, MD;  Location: [facility];  Service: Thoracic;  Laterality: N/A;   Social History   Occupational History   Occupation: Conservation officer, nature  Tobacco Use   Smoking status: Never  Passive exposure: Never   Smokeless tobacco: Never  Vaping Use   Vaping status: Never Used  Substance and Sexual Activity   Alcohol use: No    Alcohol/week: 0.0 standard drinks of alcohol   Drug use: No   Sexual activity: Not Currently    Birth control/protection: None, Post-menopausal
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
## 2023-10-11 ENCOUNTER — Other Ambulatory Visit: Payer: Self-pay | Admitting: Family Medicine

## 2023-10-11 DIAGNOSIS — I10 Essential (primary) hypertension: Secondary | ICD-10-CM

## 2023-10-18 ENCOUNTER — Other Ambulatory Visit: Payer: Self-pay | Admitting: Family Medicine

## 2023-10-18 DIAGNOSIS — I2699 Other pulmonary embolism without acute cor pulmonale: Secondary | ICD-10-CM

## 2023-11-10 ENCOUNTER — Encounter: Payer: Self-pay | Admitting: Physician Assistant

## 2023-11-12 ENCOUNTER — Other Ambulatory Visit: Payer: Self-pay | Admitting: Family Medicine

## 2023-11-12 DIAGNOSIS — I2699 Other pulmonary embolism without acute cor pulmonale: Secondary | ICD-10-CM

## 2023-11-15 ENCOUNTER — Other Ambulatory Visit: Payer: Self-pay | Admitting: Family Medicine

## 2023-11-15 DIAGNOSIS — I10 Essential (primary) hypertension: Secondary | ICD-10-CM

## 2023-11-16 ENCOUNTER — Telehealth: Payer: Self-pay | Admitting: *Deleted

## 2023-11-16 NOTE — Telephone Encounter (Unsigned)
 Copied from CRM [id number]. Topic: Clinical - Prescription Issue >> Nov 16, 2023  4:02 PM Powell HERO wrote: Reason for CRM: metoprolol  succinate (TOPROL -XL) 25 MG 24 hr tablet, [facility] needs clarification of dosage instructions and also advises they do not recommend cutting this medication in half as it is XR, please call back to clarify.

## 2023-11-17 ENCOUNTER — Ambulatory Visit (INDEPENDENT_AMBULATORY_CARE_PROVIDER_SITE_OTHER): Admitting: Family Medicine

## 2023-11-17 ENCOUNTER — Encounter: Payer: Self-pay | Admitting: Family Medicine

## 2023-11-17 VITALS — BP 142/87 | HR 67 | Ht 63.0 in | Wt 216.1 lb

## 2023-11-17 DIAGNOSIS — T161XXA Foreign body in right ear, initial encounter: Secondary | ICD-10-CM | POA: Insufficient documentation

## 2023-11-17 HISTORY — DX: Foreign body in right ear, initial encounter: T16.1XXA

## 2023-11-17 NOTE — Assessment & Plan Note (Signed)
 Pt put paper towel in right ear earlier today and felt like some of it became lodged in her ear canal.  No pain. No decreased hearing, just feeling of something in her ear.  50/50 mix of warm water /hydrogen peroxide was used along with curette to succsesfully remove remaining paper from ear canal.  Pt endorsed relief of symptoms.  Ear canal and TM appeared normal on recheck.

## 2023-11-17 NOTE — Patient Instructions (Addendum)
 It was nice to see you today,  We addressed the following topics today: -The paper has been removed.  You should not have any more issues. - If you do have any issues with your ear or hearing please let us  know.  Follow-up as needed  Have a great day,  Rolan Slain, MD

## 2023-11-17 NOTE — Progress Notes (Signed)
   Acute Office Visit  Subjective:     Patient ID: [name], female    DOB: 10-09-1960, 63 y.o.   MRN: [id number]  Chief Complaint  Patient presents with   Ear Fullness    HPI Patient is in today for   Subjective - Reports sensation of clogged ear with foreign body sensation after attempting to dry ear with tissue paper. Onset a few hours prior to visit. - Denies hearing changes.  Medications: None mentioned.  PMH, PSH, FH, Social Hx: None mentioned.  ROS Ears: Reports clogged sensation, foreign body sensation. Denies hearing loss.   ROS      Objective:    BP (!) 142/87   Pulse 67   Ht 5' 3 (1.6 m)   Wt 216 lb 1.9 oz (98 kg)   LMP 02/07/2013   SpO2 100%   BMI 38.28 kg/m    Physical Exam Gen: alert, oriented Heent: left TM normal.  Right ear canal with foreign object in the superior aspect.  Removed with combination of irrigation and currette.  After removal TM appeared normal.   No results found for any visits on 11/17/23.      Assessment & Plan:   Foreign body of right ear, initial encounter Assessment & Plan: Pt put paper towel in right ear earlier today and felt like some of it became lodged in her ear canal.  No pain. No decreased hearing, just feeling of something in her ear.  50/50 mix of warm water /hydrogen peroxide was used along with curette to succsesfully remove remaining paper from ear canal.  Pt endorsed relief of symptoms.  Ear canal and TM appeared normal on recheck.        Return if symptoms worsen or fail to improve.  Toribio MARLA Slain, MD

## 2023-11-17 NOTE — Telephone Encounter (Signed)
 Contacted pharmacy and informed them of below.    [name], [place] to Me (Selected Message)     11/17/23  8:02 AM It looks like back in Feb 2024, her GI provider recommended all of her pills be crushed due to patient being admitted to the hospital for esophageal issues/dysphagia. See Rocky Shad, PA discharge summary on 06/15/22.    A nurse followed up on 06/16/22 with the following message: [facility] called with medication clarification. Per Pharmacist, Fluoxetine  capsules can not be crushed. It is recommended that 20mg  tablets be ordered to replace capsules. Also, Metoprolol  can not be crushed, recommendations are made to split pill instead. Per CHARM Donald, PA, Fluoxetine  tablets ordered to replace capsules. Metoprolol  RX changed to reflect pharmacist recommendations.    So it looks like the recommendation to split the pill in half was per the [facility] last year. I have never seen the patient so I am not sure if she is taking it this way or not.

## 2023-11-26 ENCOUNTER — Telehealth: Payer: Self-pay

## 2023-11-26 NOTE — Telephone Encounter (Signed)
 Copied from CRM [id number]. Topic: Referral - Request for Referral >> Nov 25, 2023  3:47 PM Silvana PARAS wrote: Did the patient discuss referral with their provider in the last year? Yes (If No - schedule appointment) (If Yes - send message)  Appointment offered? Yes  Type of order/referral and detailed reason for visit: Heart Clinic   Preference of office, provider, location: [facility]  If referral order, have you been seen by this specialty before? No (If Yes, this issue or another issue? When? Where?  Can we respond through MyChart? Yes

## 2023-12-09 ENCOUNTER — Other Ambulatory Visit: Payer: Self-pay

## 2023-12-09 DIAGNOSIS — I2699 Other pulmonary embolism without acute cor pulmonale: Secondary | ICD-10-CM

## 2023-12-16 ENCOUNTER — Other Ambulatory Visit: Payer: Self-pay | Admitting: Family Medicine

## 2023-12-16 DIAGNOSIS — I10 Essential (primary) hypertension: Secondary | ICD-10-CM

## 2023-12-16 NOTE — Telephone Encounter (Signed)
 See other phone note

## 2024-01-07 ENCOUNTER — Ambulatory Visit: Payer: Medicare Other

## 2024-01-07 DIAGNOSIS — Z0000 Encounter for general adult medical examination without abnormal findings: Secondary | ICD-10-CM

## 2024-01-07 NOTE — Patient Instructions (Signed)
 Ms. Deshpande , Thank you for taking time out of your busy schedule to complete your Annual Wellness Visit with me. I enjoyed our conversation and look forward to speaking with you again next year. I, as well as your care team,  appreciate your ongoing commitment to your health goals. Please review the following plan we discussed and let me know if I can assist you in the future. Your Game plan/ To Do List    Referrals: If you haven't heard from the office you've been referred to, please reach out to them at the phone provided.   Follow up Visits: We will see or speak with you next year for your Next Medicare AWV with our clinical staff Have you seen your provider in the last 6 months (3 months if uncontrolled diabetes)? Yes  Clinician Recommendations:  Aim for 30 minutes of exercise or brisk walking, 6-8 glasses of water , and 5 servings of fruits and vegetables each day.       This is a list of the screenings recommended for you:  Health Maintenance  Topic Date Due   Pneumococcal Vaccine for age over 25 (1 of 2 - PCV) Never done   Pap with HPV screening  07/17/2022   Colon Cancer Screening  10/17/2023   Flu Shot  12/11/2023   Mammogram  03/25/2024   Medicare Annual Wellness Visit  01/06/2025   DTaP/Tdap/Td vaccine (3 - Td or Tdap) 05/20/2033   Hepatitis C Screening  Completed   HIV Screening  Completed   Hepatitis B Vaccine  Aged Out   HPV Vaccine  Aged Out   Meningitis B Vaccine  Aged Out   COVID-19 Vaccine  Discontinued   Zoster (Shingles) Vaccine  Discontinued    Advanced directives: (ACP Link)Information on Advanced Care Planning can be found at [place]  Secretary of [facility] Advance Health Care Directives Advance Health Care Directives. [email or web address]  Advance Care Planning is important because it:  [x]  Makes sure you receive the medical care that is consistent with your values, goals, and preferences  [x]  It provides guidance to your family and loved ones and reduces their  decisional burden about whether or not they are making the right decisions based on your wishes.  Follow the link provided in your after visit summary or read over the paperwork we have mailed to you to help you started getting your Advance Directives in place. If you need assistance in completing these, please reach out to us  so that we can help you!  See attachments for Preventive Care and Fall Prevention Tips.

## 2024-01-07 NOTE — Progress Notes (Signed)
 Subjective:   [name] is a 63 y.o. who presents for a Medicare Wellness preventive visit.  As a reminder, Annual Wellness Visits don't include a physical exam, and some assessments may be limited, especially if this visit is performed virtually. We may recommend an in-person follow-up visit with your provider if needed.  Visit Complete: Virtual I connected with  [name] on 01/07/24 by a audio enabled telemedicine application and verified that I am speaking with the correct person using two identifiers.  Patient Location: Home  Provider Location: Home Office  I discussed the limitations of evaluation and management by telemedicine. The patient expressed understanding and agreed to proceed.  Vital Signs: Because this visit was a virtual/telehealth visit, some criteria may be missing or patient reported. Any vitals not documented were not able to be obtained and vitals that have been documented are patient reported.  VideoError- Librarian, academic were attempted between this provider and patient, however failed, due to patient having technical difficulties OR patient did not have access to video capability.  We continued and completed visit with audio only.   Persons Participating in Visit: Patient.  AWV Questionnaire: No: Patient Medicare AWV questionnaire was not completed prior to this visit.  Cardiac Risk Factors include: advanced age (>53men, >58 women);hypertension     Objective:    Today's Vitals   There is no height or weight on file to calculate BMI.     01/07/2024   10:55 AM 01/01/2023    9:51 AM 11/17/2022   11:11 AM 07/29/2022   12:03 PM 07/24/2022   10:56 AM 06/12/2022   10:54 AM 06/10/2022    3:20 PM  Advanced Directives  Does Patient Have a Medical Advance Directive? No No No No No Unable to assess, patient is non-responsive or altered mental status No  Would patient like information on creating a medical advance directive? No -  Patient declined No - Patient declined  No - Patient declined No - Patient declined No - Patient declined No - Patient declined    Current Medications (verified) Outpatient Encounter Medications as of 01/07/2024  Medication Sig   amLODipine  (NORVASC ) 5 MG tablet Take 1 tablet by mouth once daily   buPROPion  (WELLBUTRIN  SR) 150 MG 12 hr tablet TAKE 1 TABLET BY MOUTH IN THE MORNING   cloNIDine  (CATAPRES ) 0.1 MG tablet Take 1 tablet by mouth twice daily   ELIQUIS  5 MG TABS tablet TAKE 1 TABLET BY MOUTH TWICE DAILY *  CRUSH  TABLET  BEFORE  TAKING  *   FLUoxetine  (PROZAC ) 20 MG capsule Take 2 capsules (40 mg total) by mouth daily.   metoprolol  succinate (TOPROL -XL) 25 MG 24 hr tablet TAKE 1 TABLET BY MOUTH ONCE DAILY *  SPLIT  TABLET  IN  HALF  PRIOR  TO  TAKING  *   ondansetron  (ZOFRAN ) 4 MG tablet Take 1 tablet (4 mg total) by mouth every 8 (eight) hours as needed for nausea or vomiting.   anastrozole  (ARIMIDEX ) 1 MG tablet Take 1 tablet (1 mg total) by mouth daily. (Patient not taking: Reported on 01/07/2024)   desloratadine  (CLARINEX ) 5 MG tablet Take 1 tablet (5 mg total) by mouth daily. (Patient not taking: Reported on 01/07/2024)   famotidine  (PEPCID ) 20 MG tablet Take 1 tablet (20 mg total) by mouth 2 (two) times daily. (Patient not taking: Reported on 01/07/2024)   fluticasone  (FLONASE ) 50 MCG/ACT nasal spray Place 2 sprays into both nostrils daily. (Patient not  taking: Reported on 01/07/2024)   VENTOLIN  HFA 108 (90 Base) MCG/ACT inhaler INHALE 2 PUFFS BY MOUTH EVERY 6 HOURS AS NEEDED FOR WHEEZING OR SHORTNESS OF BREATH (Patient not taking: Reported on 01/07/2024)   Vitamin D , Ergocalciferol , (DRISDOL ) 1.25 MG (50000 UNIT) CAPS capsule Take 1 capsule (50,000 Units total) by mouth every 7 (seven) days. (Patient not taking: Reported on 01/07/2024)   No facility-administered encounter medications on file as of 01/07/2024.    Allergies (verified) Tramadol    History: Past Medical History:   Diagnosis Date   Acid reflux    takes Zantac  and Omeprazole  daily   Anemia    Anxiety    takes Citaopram daily   Arthritis    right knee   Arthrosis    left thumb CMC   Breast cancer (HCC)    Chronic back pain    DDD   Clotting disorder (HCC)    hx of blood clot following knee scope, pt reports hx blood clot in her lung   Depression    Dyspnea on exertion    Fibromyalgia    History of bronchitis 3+yrs ago   Hyperlipidemia    takes Atorvastatin  daily   Hypertension    takes Lisinopril  and HCTZ daily   Insomnia    takes Elavil  nightly as needed   Paraesophageal hernia    Personal history of radiation therapy    Past Surgical History:  Procedure Laterality Date   BREAST BIOPSY Left    BREAST LUMPECTOMY     BREAST LUMPECTOMY WITH RADIOACTIVE SEED AND SENTINEL LYMPH NODE BIOPSY Right 06/03/2021   Procedure: RIGHT BREAST LUMPECTOMY WITH RADIOACTIVE SEED AND SENTINEL LYMPH NODE BIOPSY;  Surgeon: Curvin Deward MOULD, MD;  Location: [facility];  Service: General;  Laterality: Right;   BUNIONECTOMY Right 02/18/2018   Procedure: DOY EDWARDS;  Surgeon: Janit Thresa HERO, DPM;  Location: [facility];  Service: Podiatry;  Laterality: Right;   CAPSULOTOMY Bilateral 02/18/2018   Procedure: CAPSULOTOMY MPJ RELEASE JOINT 2N BILATERAL;  Surgeon: Janit Thresa HERO, DPM;  Location: [facility];  Service: Podiatry;  Laterality: Bilateral;   CARPOMETACARPEL SUSPENSION PLASTY Left 01/27/2018   Procedure: LEFT THUMB ligament reconstruction and tendon interposition;  Surgeon: Jerri Kay HERO, MD;  Location: [facility];  Service: Orthopedics;  Laterality: Left;   CARPOMETACARPEL SUSPENSION PLASTY Left 03/07/2020   Procedure: REVISION LEFT THUMB CARPOMETACARPAL (CMC) ARTHROPLASTY;  Surgeon: Jerri Kay HERO, MD;  Location: [facility];  Service: Orthopedics;  Laterality: Left;   CHOLECYSTECTOMY N/A 07/29/2022   Procedure: LAPAROSCOPIC CHOLECYSTECTOMY WITH  ICG DYE;  Surgeon:  Ebbie Cough, MD;  Location: [facility] OR;  Service: General;  Laterality: N/A;  RNFA  ICG DYE  TAP BLOCK   CHONDROPLASTY Right 06/28/2014   Procedure: CHONDROPLASTY;  Surgeon: Kay Ozell Jerri, MD;  Location: [facility];  Service: Orthopedics;  Laterality: Right;   COLONOSCOPY N/A 05/29/2014   Procedure: COLONOSCOPY;  Surgeon: Lamar HERO Hollingshead, MD;  [facility];  Service: Endoscopy;  Laterality: N/A;  215pm- Pt is working until 12:00 so she can't come any earlier   ESOPHAGOGASTRODUODENOSCOPY N/A 05/29/2014   Procedure: ESOPHAGOGASTRODUODENOSCOPY (EGD);  Surgeon: Lamar HERO Hollingshead, MD;  [facility];  Service: Endoscopy;  Laterality: N/A;   ESOPHAGOGASTRODUODENOSCOPY N/A 12/17/2020   Procedure: ESOPHAGOGASTRODUODENOSCOPY (EGD);  Surgeon: Shyrl Linnie KIDD, MD;  [facility];  Service: Thoracic;  Laterality: N/A;   ESOPHAGOGASTRODUODENOSCOPY N/A 06/12/2022   Procedure: ESOPHAGOGASTRODUODENOSCOPY (EGD);  Surgeon: Shyrl Linnie  O, MD;  [facility]: [facility];  Service: Thoracic;  Laterality: N/A;   ESOPHAGOGASTRODUODENOSCOPY N/A 11/19/2022   Procedure: ESOPHAGOGASTRODUODENOSCOPY (EGD) WITH SAVARY DILATION;  Surgeon: Shyrl Linnie KIDD, MD;  Location: [facility];  Service: Thoracic;  Laterality: N/A;   GANGLION CYST EXCISION Left 01/13/2002   HAMMER TOE SURGERY Bilateral 02/18/2018   Procedure: HAMMER TOE CORRECTION2ND BILATERAL;  Surgeon: Janit Thresa HERO, DPM;  Location: [facility];  Service: Podiatry;  Laterality: Bilateral;   HERNIA REPAIR     IRRIGATION AND DEBRIDEMENT ABSCESS Right 09/08/2021   Procedure: IRRIGATION AND DEBRIDEMENT RIGHT BREAST ABSCESS;  Surgeon: Belinda Cough, MD;  Location: [facility];  Service: General;  Laterality: Right;   KNEE ARTHROSCOPY WITH MEDIAL MENISECTOMY Right 06/28/2014   Procedure: RIGHT KNEE ARTHROSCOPY WITH PARTIAL MEDIAL MENISCECTOMY AND CHONDROPLASTY;  Surgeon: Kay Ozell Cummins, MD;  Location:  SURGERY CENTER;  Service:  Orthopedics;  Laterality: Right;   MALONEY DILATION N/A 05/29/2014   Procedure: AGAPITO DILATION;  Surgeon: Lamar HERO Hollingshead, MD;  [facility];  Service: Endoscopy;  Laterality: N/A;   PARTIAL KNEE ARTHROPLASTY Right 09/15/2014   Procedure: RIGHT UNICOMPARTMENTAL KNEE ARTHROPLASTY;  Surgeon: Kay HERO Cummins, MD;  Location: [facility];  Service: Orthopedics;  Laterality: Right;   SHOULDER ARTHROSCOPY Right    TOTAL KNEE ARTHROPLASTY Left 04/08/2004   XI ROBOTIC ASSISTED HIATAL HERNIA REPAIR N/A 06/12/2022   Procedure: XI ROBOTIC ASSISTED HIATAL HERNIA REPAIR;  Surgeon: Shyrl Linnie KIDD, MD;  Location: [facility];  Service: Thoracic;  Laterality: N/A;   XI ROBOTIC ASSISTED PARAESOPHAGEAL HERNIA REPAIR N/A 12/17/2020   Procedure: XI ROBOTIC ASSISTED Laparoscopy PARAESOPHAGEAL HERNIA REPAIR WITH FUNDOPLICATION;  Surgeon: Shyrl Linnie KIDD, MD;  Location: [facility];  Service: Thoracic;  Laterality: N/A;   Family History  Problem Relation Age of Onset   Stroke Mother    Lung cancer Father 10   GI problems Father    Prostate cancer Father    Cancer Sister        breast   Breast cancer Sister 57   Lung cancer Maternal Grandfather    Lung cancer Paternal Grandfather    Diabetes Son    Colon cancer Neg Hx    Esophageal cancer Neg Hx    Pancreatic cancer Neg Hx    Social History   Socioeconomic History   Marital status: Divorced    Spouse name: Not on file   Number of children: 5   Years of education: Not on file   Highest education level: Not on file  Occupational History   Occupation: Conservation officer, nature  Tobacco Use   Smoking status: Never    Passive exposure: Never   Smokeless tobacco: Never  Vaping Use   Vaping status: Never Used  Substance and Sexual Activity   Alcohol use: No    Alcohol/week: 0.0 standard drinks of alcohol   Drug use: No   Sexual activity: Not Currently    Birth control/protection: None, Post-menopausal  Other Topics Concern   Not on file  Social History Narrative    Right Handed    Lives in a one story home    Social Drivers of Health   Financial Resource Strain: Low Risk  (01/07/2024)   Overall Financial Resource Strain (CARDIA)    Difficulty of Paying Living Expenses: Not hard at all  Food Insecurity: No Food Insecurity (01/07/2024)   Hunger Vital Sign    Worried About Running Out of Food in the Last Year: Never true    Ran Out  of Food in the Last Year: Never true  Transportation Needs: No Transportation Needs (01/07/2024)   PRAPARE - Administrator, Civil Service (Medical): No    Lack of Transportation (Non-Medical): No  Physical Activity: Inactive (01/07/2024)   Exercise Vital Sign    Days of Exercise per Week: 0 days    Minutes of Exercise per Session: 0 min  Stress: Stress Concern Present (01/07/2024)   [employer] of Occupational Health - Occupational Stress Questionnaire    Feeling of Stress: To some extent  Social Connections: Socially Isolated (01/07/2024)   Social Connection and Isolation Panel    Frequency of Communication with Friends and Family: More than three times a week    Frequency of Social Gatherings with Friends and Family: Once a week    Attends Religious Services: Never    Database administrator or Organizations: No    Attends Engineer, structural: Never    Marital Status: Divorced    Tobacco Counseling Counseling given: Not Answered    Clinical Intake:  Pre-visit preparation completed: Yes  Pain : No/denies pain     Nutritional Risks: Nausea/ vomitting/ diarrhea (chronic nausea and some diarrhea) Diabetes: No  Lab Results  Component Value Date   HGBA1C 6.2 (H) 03/19/2023   HGBA1C 6.1 (H) 12/22/2022   HGBA1C 5.8 (H) 08/22/2019     How often do you need to have someone help you when you read instructions, pamphlets, or other written materials from your doctor or pharmacy?: 1 - Never  Interpreter Needed?: No  Information entered by :: NAllen LPN   Activities of Daily  Living     01/07/2024   10:47 AM  In your present state of health, do you have any difficulty performing the following activities:  Hearing? 0  Vision? 1  Comment a little bit  Difficulty concentrating or making decisions? 0  Walking or climbing stairs? 1  Dressing or bathing? 0  Doing errands, shopping? 0  Preparing Food and eating ? N  Using the Toilet? N  In the past six months, have you accidently leaked urine? N  Do you have problems with loss of bowel control? N  Managing your Medications? N  Managing your Finances? N  Housekeeping or managing your Housekeeping? N    Patient Care Team: Gayle Saddie JULIANNA DEVONNA as PCP - General (Physician Assistant) Shaaron Lamar HERO, MD as Consulting Physician (Gastroenterology) Jerri Kay HERO, MD as Attending Physician (Orthopedic Surgery) Everlean Fallow, MD as Consulting Physician (Rheumatology) Odean Potts, MD as Consulting Physician (Hematology and Oncology) Dewey Rush, MD as Consulting Physician (Radiation Oncology) Patel, Donika K, DO as Consulting Physician (Neurology) Ebbie Cough, MD as Consulting Physician (General Surgery)  I have updated your Care Teams any recent Medical Services you may have received from other providers in the past year.     Assessment:   This is a routine wellness examination for Aniza.  Hearing/Vision screen Hearing Screening - Comments:: Denies hearing issues Vision Screening - Comments:: Regular eye exams, [facility]   Goals Addressed             This Visit's Progress    Patient Stated       01/07/2024, denies goals       Depression Screen     01/07/2024   10:58 AM 07/13/2023    1:54 PM 06/11/2023    1:12 PM 03/02/2023    2:46 PM 01/01/2023    9:49 AM 12/29/2022    1:10  PM 06/23/2022    1:16 PM  PHQ 2/9 Scores  PHQ - 2 Score 3 6 6 6  0 6 4  PHQ- 9 Score 10 19 15 16  0 21 10    Fall Risk     01/07/2024   10:55 AM 07/13/2023    1:53 PM 06/11/2023    1:12 PM 06/02/2023   10:58 AM  01/01/2023    9:50 AM  Fall Risk   Falls in the past year? 1 0 1 0 0  Comment does not know      Number falls in past yr: 1 0 1  0  Injury with Fall? 0 0 0  0  Risk for fall due to : History of fall(s);Impaired balance/gait;Medication side effect No Fall Risks History of fall(s)  No Fall Risks  Follow up Falls prevention discussed;Falls evaluation completed Falls evaluation completed Falls evaluation completed  Falls prevention discussed    MEDICARE RISK AT HOME:  Medicare Risk at Home Any stairs in or around the home?: Yes If so, are there any without handrails?: No Home free of loose throw rugs in walkways, pet beds, electrical cords, etc?: Yes Adequate lighting in your home to reduce risk of falls?: Yes Life alert?: No Use of a cane, walker or w/c?: No Grab bars in the bathroom?: No Shower chair or bench in shower?: No Elevated toilet seat or a handicapped toilet?: No  TIMED UP AND GO:  Was the test performed?  No  Cognitive Function: 6CIT completed        01/07/2024   11:01 AM 01/01/2023    9:51 AM 02/12/2021    2:04 PM  6CIT Screen  What Year? 0 points 0 points 0 points  What month? 0 points 0 points 0 points  What time? 0 points 0 points 0 points  Count back from 20 0 points 0 points 0 points  Months in reverse 0 points 0 points 0 points  Repeat phrase 0 points 0 points 0 points  Total Score 0 points 0 points 0 points    Immunizations Immunization History  Administered Date(s) Administered   Tdap 04/29/2013, 05/21/2023    Screening Tests Health Maintenance  Topic Date Due   Pneumococcal Vaccine: 50+ Years (1 of 2 - PCV) Never done   Cervical Cancer Screening (HPV/Pap Cotest)  07/17/2022   Colonoscopy  10/17/2023   INFLUENZA VACCINE  12/11/2023   MAMMOGRAM  03/25/2024   Medicare Annual Wellness (AWV)  01/06/2025   DTaP/Tdap/Td (3 - Td or Tdap) 05/20/2033   Hepatitis C Screening  Completed   HIV Screening  Completed   Hepatitis B Vaccines 19-59 Average  Risk  Aged Out   HPV VACCINES  Aged Out   Meningococcal B Vaccine  Aged Out   COVID-19 Vaccine  Discontinued   Zoster Vaccines- Shingrix  Discontinued    Health Maintenance  Health Maintenance Due  Topic Date Due   Pneumococcal Vaccine: 50+ Years (1 of 2 - PCV) Never done   Cervical Cancer Screening (HPV/Pap Cotest)  07/17/2022   Colonoscopy  10/17/2023   INFLUENZA VACCINE  12/11/2023   Health Maintenance Items Addressed: Declines flu and pneumonia vaccines.  Additional Screening:  Vision Screening: Recommended annual ophthalmology exams for early detection of glaucoma and other disorders of the eye. Would you like a referral to an eye doctor? No    Dental Screening: Recommended annual dental exams for proper oral hygiene  Community Resource Referral / Chronic Care Management: CRR required this visit?  No   CCM required this visit?  No   Plan:    I have personally reviewed and noted the following in the patient's chart:   Medical and social history Use of alcohol, tobacco or illicit drugs  Current medications and supplements including opioid prescriptions. Patient is not currently taking opioid prescriptions. Functional ability and status Nutritional status Physical activity Advanced directives List of other physicians Hospitalizations, surgeries, and ER visits in previous 12 months Vitals Screenings to include cognitive, depression, and falls Referrals and appointments  In addition, I have reviewed and discussed with patient certain preventive protocols, quality metrics, and best practice recommendations. A written personalized care plan for preventive services as well as general preventive health recommendations were provided to patient.   Ardella FORBES Dawn, LPN   [id number]/7974   After Visit Summary: (MyChart) Due to this being a telephonic visit, the after visit summary with patients personalized plan was offered to patient via MyChart   Notes: Nothing  significant to report at this time.

## 2024-01-10 ENCOUNTER — Other Ambulatory Visit: Payer: Self-pay

## 2024-01-10 DIAGNOSIS — I2699 Other pulmonary embolism without acute cor pulmonale: Secondary | ICD-10-CM

## 2024-01-12 ENCOUNTER — Other Ambulatory Visit: Payer: Self-pay

## 2024-01-12 DIAGNOSIS — I10 Essential (primary) hypertension: Secondary | ICD-10-CM

## 2024-01-13 ENCOUNTER — Ambulatory Visit: Payer: Self-pay | Admitting: Physician Assistant

## 2024-01-13 ENCOUNTER — Ambulatory Visit: Admitting: Physician Assistant

## 2024-01-13 ENCOUNTER — Other Ambulatory Visit (INDEPENDENT_AMBULATORY_CARE_PROVIDER_SITE_OTHER)

## 2024-01-13 ENCOUNTER — Encounter: Payer: Self-pay | Admitting: Physician Assistant

## 2024-01-13 VITALS — BP 106/80 | HR 75 | Ht 63.0 in | Wt 217.0 lb

## 2024-01-13 DIAGNOSIS — Z7901 Long term (current) use of anticoagulants: Secondary | ICD-10-CM

## 2024-01-13 DIAGNOSIS — K5900 Constipation, unspecified: Secondary | ICD-10-CM

## 2024-01-13 DIAGNOSIS — E876 Hypokalemia: Secondary | ICD-10-CM | POA: Diagnosis not present

## 2024-01-13 DIAGNOSIS — Z860101 Personal history of adenomatous and serrated colon polyps: Secondary | ICD-10-CM

## 2024-01-13 LAB — CBC WITH DIFFERENTIAL/PLATELET
Basophils Absolute: 0.1 K/uL (ref 0.0–0.1)
Basophils Relative: 1 % (ref 0.0–3.0)
Eosinophils Absolute: 0.1 K/uL (ref 0.0–0.7)
Eosinophils Relative: 1.3 % (ref 0.0–5.0)
HCT: 41.5 % (ref 36.0–46.0)
Hemoglobin: 13.7 g/dL (ref 12.0–15.0)
Lymphocytes Relative: 21.1 % (ref 12.0–46.0)
Lymphs Abs: 1.6 K/uL (ref 0.7–4.0)
MCHC: 33.1 g/dL (ref 30.0–36.0)
MCV: 91 fl (ref 78.0–100.0)
Monocytes Absolute: 0.7 K/uL (ref 0.1–1.0)
Monocytes Relative: 9.3 % (ref 3.0–12.0)
Neutro Abs: 5.2 K/uL (ref 1.4–7.7)
Neutrophils Relative %: 67.3 % (ref 43.0–77.0)
Platelets: 337 K/uL (ref 150.0–400.0)
RBC: 4.57 Mil/uL (ref 3.87–5.11)
RDW: 14.7 % (ref 11.5–15.5)
WBC: 7.8 K/uL (ref 4.0–10.5)

## 2024-01-13 LAB — COMPREHENSIVE METABOLIC PANEL WITH GFR
ALT: 28 U/L (ref 0–35)
AST: 15 U/L (ref 0–37)
Albumin: 4.1 g/dL (ref 3.5–5.2)
Alkaline Phosphatase: 130 U/L — ABNORMAL HIGH (ref 39–117)
BUN: 19 mg/dL (ref 6–23)
CO2: 27 meq/L (ref 19–32)
Calcium: 9.5 mg/dL (ref 8.4–10.5)
Chloride: 101 meq/L (ref 96–112)
Creatinine, Ser: 1.1 mg/dL (ref 0.40–1.20)
GFR: 54.14 mL/min — ABNORMAL LOW (ref >60.00)
Glucose, Bld: 118 mg/dL — ABNORMAL HIGH (ref 70–99)
Potassium: 4 meq/L (ref 3.5–5.1)
Sodium: 136 meq/L (ref 135–145)
Total Bilirubin: 0.4 mg/dL (ref 0.2–1.2)
Total Protein: 7.2 g/dL (ref 6.0–8.3)

## 2024-01-13 NOTE — Progress Notes (Signed)
 Labs look good.  Okay to proceed with colonoscopy.  No further hypokalemia.  JLL, PA-C

## 2024-01-13 NOTE — Progress Notes (Signed)
 Chief Complaint: Discuss colonoscopy for patient on chronic anticoagulation  HPI:    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 Shila, who was referred to me by Wallace Joesph LABOR, PA for sideration of a repeat colonoscopy.    10/16/2020 EGD with large hiatal hernia, Z-line regular 36 cm from incisors and otherwise normal.  Esophageal manometry recommended.  Refer to Dr. Shyrl for evaluation of hernia repair/fundoplication.   6 /7/22 colonoscopy for screening with four 3-7 mm polyps in the rectum, sigmoid colon and ascending colon as well as nonbleeding internal hemorrhoids.  Pathology showed mixture of tubular adenoma and sessile serrated polyp.  Repeat recommended in 3 years.    02/05/2023 patient had a swallow study with speech pathology.  Discussed that she had esophagram done in May which showed narrowed appearance of the distal esophagus at the GE junction similar to prior esophagram in February 2024 and consistent with history of fundoplication.  Also prominent CP bar.  Underwent Maloney dilation with Dr. Shyrl in July 2024 dilated to 16 [place].  No significant improvement in symptoms.  At that time recommended management of GERD/LPR and postnasal drip to reduce sensation of phlegm/mucus in throat and resolved chronic throat clearing coughing.    06/01/2023 CTAP with contrast showed stable surgical changes along the right breast and axillary region, adrenal adenoma, scattered colonic stool, coronary artery calcifications, surgical changes from presumed hiatal hernia repair and prior cholecystectomy.    06/11/2023 CMP with a glucose of 121, creatinine 1.41, potassium 3.0.  CBC with a WBC 11.1 otherwise normal.    Today, the  patient presents to clinic and tells me that she is due for her colonoscopy.  Explains that she has not had any labs drawn since January but they did stop one of her blood pressure medications which was thought to give her low potassium.  Denies any acute GI complaints or concerns other than constipation which has been chronic for her over the past 6 months or so.  She occasionally will take 3 over-the-counter laxatives at a time in order to have a bowel movement which can sometimes go 2 weeks in between.  Before bowel movement she may have a lot of left lower quadrant discomfort and then actually have diarrhea and explosive stool and then feels empty for a little while until it builds up again.  Denies any blood in her stool or weight loss.    Continues on Eliquis  for history of blood clots during chemotherapy.    Denies fever, chills, weight loss, nausea or vomiting.  Past Medical History:  Diagnosis Date   Acid reflux    takes Zantac  and Omeprazole  daily   Anemia    Anxiety    takes Citaopram daily   Arthritis    right knee   Arthrosis    left thumb [facility]   Breast cancer (HCC)    Chronic back pain    DDD   Clotting disorder (HCC)    hx of blood clot following knee scope, pt reports hx blood clot in her lung   Depression    Dyspnea on exertion    Fibromyalgia    History of bronchitis 3+yrs ago   Hyperlipidemia    takes Atorvastatin  daily   Hypertension  takes Lisinopril  and HCTZ daily   Insomnia    takes Elavil  nightly as needed   Paraesophageal hernia    Personal history of radiation therapy     Past Surgical History:  Procedure Laterality Date   BREAST BIOPSY Left    BREAST LUMPECTOMY     BREAST LUMPECTOMY WITH RADIOACTIVE SEED AND SENTINEL LYMPH NODE BIOPSY Right 06/03/2021   Procedure: RIGHT BREAST LUMPECTOMY WITH RADIOACTIVE SEED AND SENTINEL LYMPH NODE BIOPSY;  Surgeon: Curvin Deward MOULD, MD;  Location: [facility];  Service: General;  Laterality: Right;    BUNIONECTOMY Right 02/18/2018   Procedure: DOY EDWARDS;  Surgeon: Janit Thresa HERO, DPM;  Location: [facility];  Service: Podiatry;  Laterality: Right;   CAPSULOTOMY Bilateral 02/18/2018   Procedure: CAPSULOTOMY MPJ RELEASE JOINT 2N BILATERAL;  Surgeon: Janit Thresa HERO, DPM;  Location: [facility];  Service: Podiatry;  Laterality: Bilateral;   CARPOMETACARPEL SUSPENSION PLASTY Left 01/27/2018   Procedure: LEFT THUMB ligament reconstruction and tendon interposition;  Surgeon: Jerri Kay HERO, MD;  Location: [facility];  Service: Orthopedics;  Laterality: Left;   CARPOMETACARPEL SUSPENSION PLASTY Left 03/07/2020   Procedure: REVISION LEFT THUMB CARPOMETACARPAL (CMC) ARTHROPLASTY;  Surgeon: Jerri Kay HERO, MD;  Location: [facility];  Service: Orthopedics;  Laterality: Left;   CHOLECYSTECTOMY N/A 07/29/2022   Procedure: LAPAROSCOPIC CHOLECYSTECTOMY WITH  ICG DYE;  Surgeon: Ebbie Cough, MD;  Location: [facility] OR;  Service: General;  Laterality: N/A;  RNFA  ICG DYE  TAP BLOCK   CHONDROPLASTY Right 06/28/2014   Procedure: CHONDROPLASTY;  Surgeon: Kay Ozell Jerri, MD;  Location:  SURGERY CENTER;  Service: Orthopedics;  Laterality: Right;   COLONOSCOPY N/A 05/29/2014   Procedure: COLONOSCOPY;  Surgeon: Lamar HERO Hollingshead, MD;  [facility];  Service: Endoscopy;  Laterality: N/A;  215pm- Pt is working until 12:00 so she can't come any earlier   ESOPHAGOGASTRODUODENOSCOPY N/A 05/29/2014   Procedure: ESOPHAGOGASTRODUODENOSCOPY (EGD);  Surgeon: Lamar HERO Hollingshead, MD;  [facility];  Service: Endoscopy;  Laterality: N/A;   ESOPHAGOGASTRODUODENOSCOPY N/A 12/17/2020   Procedure: ESOPHAGOGASTRODUODENOSCOPY (EGD);  Surgeon: Shyrl Linnie KIDD, MD;  Location[facility] OR;  Service: Thoracic;  Laterality: N/A;   ESOPHAGOGASTRODUODENOSCOPY N/A 06/12/2022   Procedure: ESOPHAGOGASTRODUODENOSCOPY (EGD);  Surgeon: Shyrl Linnie KIDD, MD;  Location: [facility] OR;  Service:  Thoracic;  Laterality: N/A;   ESOPHAGOGASTRODUODENOSCOPY N/A 11/19/2022   Procedure: ESOPHAGOGASTRODUODENOSCOPY (EGD) WITH SAVARY DILATION;  Surgeon: Shyrl Linnie KIDD, MD;  Location: [facility];  Service: Thoracic;  Laterality: N/A;   GANGLION CYST EXCISION Left 01/13/2002   HAMMER TOE SURGERY Bilateral 02/18/2018   Procedure: HAMMER TOE CORRECTION2ND BILATERAL;  Surgeon: Janit Thresa HERO, DPM;  Location: [facility];  Service: Podiatry;  Laterality: Bilateral;   HERNIA REPAIR     IRRIGATION AND DEBRIDEMENT ABSCESS Right 09/08/2021   Procedure: IRRIGATION AND DEBRIDEMENT RIGHT BREAST ABSCESS;  Surgeon: Belinda Cough, MD;  Location: [facility];  Service: General;  Laterality: Right;   KNEE ARTHROSCOPY WITH MEDIAL MENISECTOMY Right 06/28/2014   Procedure: RIGHT KNEE ARTHROSCOPY WITH PARTIAL MEDIAL MENISCECTOMY AND CHONDROPLASTY;  Surgeon: Kay Ozell Jerri, MD;  Location:  SURGERY CENTER;  Service: Orthopedics;  Laterality: Right;   MALONEY DILATION N/A 05/29/2014   Procedure: AGAPITO DILATION;  Surgeon: Lamar HERO Hollingshead, MD;  [facility];  Service: Endoscopy;  Laterality: N/A;   PARTIAL KNEE ARTHROPLASTY Right 09/15/2014   Procedure: RIGHT UNICOMPARTMENTAL KNEE ARTHROPLASTY;  Surgeon: Kay HERO Jerri, MD;  Location: [facility]  OR;  Service: Orthopedics;  Laterality: Right;   SHOULDER ARTHROSCOPY Right    TOTAL KNEE ARTHROPLASTY Left 04/08/2004   XI ROBOTIC ASSISTED HIATAL HERNIA REPAIR N/A 06/12/2022   Procedure: XI ROBOTIC ASSISTED HIATAL HERNIA REPAIR;  Surgeon: Shyrl Linnie KIDD, MD;  Location: [facility];  Service: Thoracic;  Laterality: N/A;   XI ROBOTIC ASSISTED PARAESOPHAGEAL HERNIA REPAIR N/A 12/17/2020   Procedure: XI ROBOTIC ASSISTED Laparoscopy PARAESOPHAGEAL HERNIA REPAIR WITH FUNDOPLICATION;  Surgeon: Shyrl Linnie KIDD, MD;  Location: [facility];  Service: Thoracic;  Laterality: N/A;    Current Outpatient Medications  Medication Sig Dispense Refill   amLODipine  (NORVASC ) 5 MG tablet Take  1 tablet by mouth once daily 90 tablet 0   anastrozole  (ARIMIDEX ) 1 MG tablet Take 1 tablet (1 mg total) by mouth daily. (Patient not taking: Reported on 01/07/2024) 90 tablet 3   buPROPion  (WELLBUTRIN  SR) 150 MG 12 hr tablet TAKE 1 TABLET BY MOUTH IN THE MORNING 90 tablet 1   cloNIDine  (CATAPRES ) 0.1 MG tablet Take 1 tablet by mouth twice daily 180 tablet 0   desloratadine  (CLARINEX ) 5 MG tablet Take 1 tablet (5 mg total) by mouth daily. (Patient not taking: Reported on 01/07/2024) 90 tablet 3   ELIQUIS  5 MG TABS tablet TAKE 1 TABLET BY MOUTH TWICE DAILY *  CRUSH  TABLET  BEFORE  TAKING  * 60 tablet 0   famotidine  (PEPCID ) 20 MG tablet Take 1 tablet (20 mg total) by mouth 2 (two) times daily. (Patient not taking: Reported on 01/07/2024) 30 tablet 1   FLUoxetine  (PROZAC ) 20 MG capsule Take 2 capsules (40 mg total) by mouth daily. 180 capsule 1   fluticasone  (FLONASE ) 50 MCG/ACT nasal spray Place 2 sprays into both nostrils daily. (Patient not taking: Reported on 01/07/2024) 16 g 6   metoprolol  succinate (TOPROL -XL) 25 MG 24 hr tablet TAKE 1 TABLET BY MOUTH ONCE DAILY *  SPLIT  TABLET  IN  HALF  PRIOR  TO  TAKING  * 90 tablet 0   ondansetron  (ZOFRAN ) 4 MG tablet Take 1 tablet (4 mg total) by mouth every 8 (eight) hours as needed for nausea or vomiting. 20 tablet 3   VENTOLIN  HFA 108 (90 Base) MCG/ACT inhaler INHALE 2 PUFFS BY MOUTH EVERY 6 HOURS AS NEEDED FOR WHEEZING OR SHORTNESS OF BREATH (Patient not taking: Reported on 01/07/2024) 18 g 0   Vitamin D , Ergocalciferol , (DRISDOL ) 1.25 MG (50000 UNIT) CAPS capsule Take 1 capsule (50,000 Units total) by mouth every 7 (seven) days. (Patient not taking: Reported on 01/07/2024) 12 capsule 0   No current facility-administered medications for this visit.    Allergies as of 01/13/2024 - Review Complete 01/07/2024  Allergen Reaction Noted   Tramadol  Itching 07/02/2016    Family History  Problem Relation Age of Onset   Stroke Mother    Lung cancer Father 35    GI problems Father    Prostate cancer Father    Cancer Sister        breast   Breast cancer Sister 57   Lung cancer Maternal Grandfather    Lung cancer Paternal Grandfather    Diabetes Son    Colon cancer Neg Hx    Esophageal cancer Neg Hx    Pancreatic cancer Neg Hx     Social History   Socioeconomic History   Marital status: Divorced    Spouse name: Not on file   Number of children: 5   Years of education: Not on file  Highest education level: Not on file  Occupational History   Occupation: Conservation officer, nature  Tobacco Use   Smoking status: Never    Passive exposure: Never   Smokeless tobacco: Never  Vaping Use   Vaping status: Never Used  Substance and Sexual Activity   Alcohol use: No    Alcohol/week: 0.0 standard drinks of alcohol   Drug use: No   Sexual activity: Not Currently    Birth control/protection: None, Post-menopausal  Other Topics Concern   Not on file  Social History Narrative   Right Handed    Lives in a one story home    Social Drivers of Health   Financial Resource Strain: Low Risk  (01/07/2024)   Overall Financial Resource Strain (CARDIA)    Difficulty of Paying Living Expenses: Not hard at all  Food Insecurity: No Food Insecurity (01/07/2024)   Hunger Vital Sign    Worried About Running Out of Food in the Last Year: Never true    Ran Out of Food in the Last Year: Never true  Transportation Needs: No Transportation Needs (01/07/2024)   PRAPARE - Administrator, Civil Service (Medical): No    Lack of Transportation (Non-Medical): No  Physical Activity: Inactive (01/07/2024)   Exercise Vital Sign    Days of Exercise per Week: 0 days    Minutes of Exercise per Session: 0 min  Stress: Stress Concern Present (01/07/2024)   [employer] of Occupational Health - Occupational Stress Questionnaire    Feeling of Stress: To some extent  Social Connections: Socially Isolated (01/07/2024)   Social Connection and Isolation Panel    Frequency of  Communication with Friends and Family: More than three times a week    Frequency of Social Gatherings with Friends and Family: Once a week    Attends Religious Services: Never    Database administrator or Organizations: No    Attends Banker Meetings: Never    Marital Status: Divorced  Catering manager Violence: Not At Risk (01/07/2024)   Humiliation, Afraid, Rape, and Kick questionnaire    Fear of Current or Ex-Partner: No    Emotionally Abused: No    Physically Abused: No    Sexually Abused: No    Review of Systems:    Constitutional: No weight loss, fever or chills Skin: No rash Cardiovascular: No chest pain   Respiratory: No SOB Gastrointestinal: See HPI and otherwise negative Genitourinary: No dysuria Neurological: No headache, dizziness or syncope Musculoskeletal: No new muscle or joint pain Hematologic: No bleeding  Psychiatric: No history of depression or anxiety   Physical Exam:  Vital signs: BP 106/80   Pulse 75   Ht 5' 3 (1.6 m)   Wt 217 lb (98.4 kg)   LMP 02/07/2013   BMI 38.44 kg/m    Constitutional:   Pleasant overweight Caucasian female appears to be in NAD, Well developed, Well nourished, alert and cooperative Head:  Normocephalic and atraumatic. Eyes:   PEERL, EOMI. No icterus. Conjunctiva pink. Ears:  Normal auditory acuity. Neck:  Supple Throat: Oral cavity and pharynx without inflammation, swelling or lesion.  Respiratory: Respirations even and unlabored. Lungs clear to auscultation bilaterally.   No wheezes, crackles, or rhonchi.  Cardiovascular: Normal S1, S2. No MRG. Regular rate and rhythm. No peripheral edema, cyanosis or pallor.  Gastrointestinal:  Soft, nondistended, nontender. No rebound or guarding. Normal bowel sounds. No appreciable masses or hepatomegaly. Rectal:  Not performed.  Msk:  Symmetrical without gross deformities. Without edema, no  deformity or joint abnormality.  Neurologic:  Alert and  oriented x4;  grossly normal  neurologically.  Skin:   Dry and intact without significant lesions or rashes. Psychiatric: Demonstrates good judgement and reason without abnormal affect or behaviors.  Most recent labs: CBC    Component Value Date/Time   WBC 11.1 (H) 06/02/2023 1146   RBC 4.35 06/02/2023 1146   HGB 13.3 06/02/2023 1146   HGB 14.3 03/19/2023 1015   HCT 40.6 06/02/2023 1146   HCT 42.6 03/19/2023 1015   PLT 379.0 06/02/2023 1146   PLT 396 03/19/2023 1015   MCV 93.2 06/02/2023 1146   MCV 93 03/19/2023 1015   MCH 31.1 03/19/2023 1015   MCH 29.6 11/17/2022 1058   MCHC 32.8 06/02/2023 1146   RDW 14.4 06/02/2023 1146   RDW 13.8 03/19/2023 1015   LYMPHSABS 2.1 06/02/2023 1146   LYMPHSABS 2.1 03/19/2023 1015   MONOABS 1.1 (H) 06/02/2023 1146   EOSABS 0.1 06/02/2023 1146   EOSABS 0.2 03/19/2023 1015   BASOSABS 0.1 06/02/2023 1146   BASOSABS 0.1 03/19/2023 1015    CMP     Component Value Date/Time   NA 141 06/11/2023 1339   K 3.0 (L) 06/11/2023 1339   CL 95 (L) 06/11/2023 1339   CO2 23 06/11/2023 1339   GLUCOSE 121 (H) 06/11/2023 1339   GLUCOSE 127 (H) 11/19/2022 1316   BUN 16 06/11/2023 1339   CREATININE 1.41 (H) 06/11/2023 1339   CREATININE 1.05 (H) 03/27/2021 1208   CREATININE 0.91 04/16/2016 1632   CALCIUM  10.0 06/11/2023 1339   PROT 6.9 06/11/2023 1339   ALBUMIN 4.2 06/11/2023 1339   AST 20 06/11/2023 1339   AST 20 03/27/2021 1208   ALT 22 06/11/2023 1339   ALT 17 03/27/2021 1208   ALKPHOS 114 06/11/2023 1339   BILITOT 0.3 06/11/2023 1339   BILITOT 0.4 03/27/2021 1208   GFRNONAA 46 (L) 11/17/2022 1058   GFRNONAA >60 03/27/2021 1208   GFRNONAA 68 07/05/2014 1138   GFRAA 59 (L) 06/04/2020 1119   GFRAA 79 07/05/2014 1138    Assessment: 1.  History of adenomatous polyps: Last colonoscopy in 2022 with 4 polyps mixed sessile serrated and tubular adenoma, repeat recommended in 3 years, patient is a few months overdue 2.  Chronic constipation: For at least the past 6 months, can  sometimes go 2 weeks in between a bowel movement with some left lower quadrant pain before loose stool; likely overflow constipation from medication side effect +/- diet +/- slow transit +/- IBS-C 3.  Hypokalemia: On last labs patient had a low potassium, apparently PCP thought this was related to Hydrochlorothiazide  which has since been stopped, will recheck labs today  Plan: 1.  Scheduled patient for a surveillance colonoscopy due to history of adenomatous polyp in the [facility] with Dr. Nandigam.  Did provide the patient with a detailed list of risks for the procedure and she agrees to proceed. Patient is appropriate for endoscopic procedure(s) in the ambulatory ([facility]) setting.  2.  Patient advised to hold her Eliquis  for 2 days prior to time of procedure.  Will communicate with her prescribing physician to ensure this is acceptable for her. 3.  Patient will have a 2-day bowel prep given history of constipation. 4.  Recommend the patient start MiraLAX  daily, discussed titration of this up to 4 times a day as needed 5.  Repeat CBC and CMP today given history of hypokalemia 6.  Patient to follow in clinic per recommendations after time of  procedure.  Delon Failing, PA-C [facility] 01/13/2024, 10:56 AM  Cc: Wallace Joesph LABOR, PA

## 2024-01-13 NOTE — Patient Instructions (Signed)
 Your provider has requested that you go to the basement level for lab work before leaving today. Press B on the elevator. The lab is located at the first door on the left as you exit the elevator.  You have been scheduled for a colonoscopy. Please follow written instructions given to you at your visit today.   If you use inhalers (even only as needed), please bring them with you on the day of your procedure.  DO NOT TAKE 7 DAYS PRIOR TO TEST- Trulicity (dulaglutide) Ozempic, Wegovy (semaglutide) Mounjaro (tirzepatide) Bydureon Bcise (exanatide extended release)  DO NOT TAKE 1 DAY PRIOR TO YOUR TEST Rybelsus (semaglutide) Adlyxin (lixisenatide) Victoza (liraglutide) Byetta (exanatide)  _______________________________________________________  If your blood pressure at your visit was 140/90 or greater, please contact your primary care physician to follow up on this.  _______________________________________________________  If you are age 15 or older, your body mass index should be between 23-30. Your Body mass index is 38.44 kg/m. If this is out of the aforementioned range listed, please consider follow up with your Primary Care Provider.  If you are age 63 or younger, your body mass index should be between 19-25. Your Body mass index is 38.44 kg/m. If this is out of the aformentioned range listed, please consider follow up with your Primary Care Provider.   ________________________________________________________  The [facility] providers would like to encourage you to use MYCHART to communicate with providers for non-urgent requests or questions.  Due to long hold times on the telephone, sending your provider a message by [facility] may be a faster and more efficient way to get a response.  Please allow 48 business hours for a response.  Please remember that this is for non-urgent requests.  _______________________________________________________  [facility] is using a  team-based approach to care.  Your team is made up of your doctor and two to three APPS. Our APPS (Nurse Practitioners and Physician Assistants) work with your physician to ensure care continuity for you. They are fully qualified to address your health concerns and develop a treatment plan. They communicate directly with your gastroenterologist to care for you. Seeing the Advanced Practice Practitioners on your physician's team can help you by facilitating care more promptly, often allowing for earlier appointments, access to diagnostic testing, procedures, and other specialty referrals.

## 2024-01-20 ENCOUNTER — Telehealth (INDEPENDENT_AMBULATORY_CARE_PROVIDER_SITE_OTHER)

## 2024-01-20 ENCOUNTER — Other Ambulatory Visit: Payer: Self-pay

## 2024-01-20 ENCOUNTER — Telehealth: Payer: Self-pay

## 2024-01-20 DIAGNOSIS — R251 Tremor, unspecified: Secondary | ICD-10-CM | POA: Insufficient documentation

## 2024-01-20 DIAGNOSIS — F419 Anxiety disorder, unspecified: Secondary | ICD-10-CM

## 2024-01-20 DIAGNOSIS — I340 Nonrheumatic mitral (valve) insufficiency: Secondary | ICD-10-CM | POA: Diagnosis not present

## 2024-01-20 NOTE — Progress Notes (Signed)
 Virtual Visit via Video Note  I connected with [name] on 01/20/24 at  9:30 AM EDT by a video enabled telemedicine application and verified that I am speaking with the correct person using two identifiers.  Patient Location: Home Provider Location: Office/Clinic  I discussed the limitations, risks, security, and privacy concerns of performing an evaluation and management service by video and the availability of in person appointments. I also discussed with the patient that there may be a patient responsible charge related to this service. The patient expressed understanding and agreed to proceed.  Subjective: PCP: Gayle Saddie FALCON, PA-C  Chief Complaint  Patient presents with   Acute Visit    Patient states she wanted an referral to the heart doctor due to a leakage her lung doctor told her about.   HPI  [name] is a 63 y.o. female who presents via video visit to discuss the desire to be referred to a cardiology office.   Patient reports that she was seen by a pulmonologist earlier this year for workup on dyspnea on exertion. An echocardiogram was performed on 06/25/23, which revealed mild mitral valve regurgitation and patient was advised by pulmonology that while this mild leakage is not likely to be the cause of her symptoms, she should establish with cardiology to have this checked on once per year. She denies current chest pain, swelling, shortness of breath at rest, etc.   Patient also reports that she has been experiencing a mild tremor in both hands that do not interfere with her daily activities, but her family noticed it and wanted her to mention it at this appointment. She denies changes to medications or any other accompanying symptoms. Does report I still do get depressed sometimes but is happy at her current dosages of medication. Denies SI/HI.   ROS: Per HPI  Current Outpatient Medications:    amLODipine  (NORVASC ) 5 MG tablet, Take 1 tablet by mouth once daily,  Disp: 90 tablet, Rfl: 0   buPROPion  (WELLBUTRIN  SR) 150 MG 12 hr tablet, TAKE 1 TABLET BY MOUTH IN THE MORNING, Disp: 90 tablet, Rfl: 1   cloNIDine  (CATAPRES ) 0.1 MG tablet, Take 1 tablet by mouth twice daily, Disp: 180 tablet, Rfl: 0   ELIQUIS  5 MG TABS tablet, TAKE 1 TABLET BY MOUTH TWICE DAILY *  CRUSH  TABLET  BEFORE  TAKING  *, Disp: 60 tablet, Rfl: 0   FLUoxetine  (PROZAC ) 20 MG capsule, Take 2 capsules (40 mg total) by mouth daily., Disp: 180 capsule, Rfl: 1   metoprolol  succinate (TOPROL -XL) 25 MG 24 hr tablet, TAKE 1 TABLET BY MOUTH ONCE DAILY *  SPLIT  TABLET  IN  HALF  PRIOR  TO  TAKING  *, Disp: 90 tablet, Rfl: 0   ondansetron  (ZOFRAN ) 4 MG tablet, Take 1 tablet (4 mg total) by mouth every 8 (eight) hours as needed for nausea or vomiting., Disp: 20 tablet, Rfl: 3  Observations/Objective: There were no vitals filed for this visit. Physical Exam Constitutional:      Appearance: Normal appearance.  Neurological:     General: No focal deficit present.     Mental Status: She is alert.  Psychiatric:        Mood and Affect: Mood normal.        Behavior: Behavior normal.        Thought Content: Thought content normal.    PE limited due to media of this visit being via video    Assessment and  Plan: Mitral valve insufficiency, unspecified etiology Assessment & Plan: Discovered incidentally on ECHO in Feb 2025. Referral placed to cardiology for further workup and regular monitoring.   Tremor of both hands Assessment & Plan: Likely secondary to high dose of Fluoxetine  (40 mg daily). Patient prefers to stay on this medication due to the mood benefits. Will follow up if tremor becomes worse or begins to affect her daily activities. If symptoms worsen, consider referral to neurology vs taper off of Fluoxetine .   Anxiety Assessment & Plan: Stable. Continue Prozac  40 mg daily and Wellbutrin  150 mg BID.  Provided resources for virtual counseling via MyChart given that patient prefers  video visits due to lack of transportation to in-person visits.     Follow Up Instructions: Return in about 4 months (around 05/21/2024) for Physical.   I discussed the assessment and treatment plan with the patient. The patient was provided an opportunity to ask questions, and all were answered. The patient agreed with the plan and demonstrated an understanding of the instructions.   The patient was advised to call back or seek an in-person evaluation if the symptoms worsen or if the condition fails to improve as anticipated.  The above assessment and management plan was discussed with the patient. The patient verbalized understanding of and has agreed to the management plan.   Saddie JULIANNA Sacks, PA-C

## 2024-01-20 NOTE — Assessment & Plan Note (Signed)
 Discovered incidentally on ECHO in Feb 2025. Referral placed to cardiology for further workup and regular monitoring.

## 2024-01-20 NOTE — Assessment & Plan Note (Signed)
 Stable. Continue Prozac  40 mg daily and Wellbutrin  150 mg BID.  Provided resources for virtual counseling via MyChart given that patient prefers video visits due to lack of transportation to in-person visits.

## 2024-01-20 NOTE — Assessment & Plan Note (Signed)
 Likely secondary to high dose of Fluoxetine  (40 mg daily). Patient prefers to stay on this medication due to the mood benefits. Will follow up if tremor becomes worse or begins to affect her daily activities. If symptoms worsen, consider referral to neurology vs taper off of Fluoxetine .

## 2024-01-20 NOTE — Telephone Encounter (Signed)
 LVM for return call to schedule physical scheduled for Jan with labs the week before

## 2024-02-08 ENCOUNTER — Other Ambulatory Visit: Payer: Self-pay

## 2024-02-08 DIAGNOSIS — I2699 Other pulmonary embolism without acute cor pulmonale: Secondary | ICD-10-CM

## 2024-02-12 ENCOUNTER — Telehealth: Payer: Self-pay

## 2024-02-12 NOTE — Telephone Encounter (Signed)
  [name] 1961-01-03 [id number]  02/12/2024   Dear Saddie Sacks, PA-C:  We have scheduled the above named patient for a(n) colonoscopy procedure. Our records show that (s)he is on anticoagulation therapy.  Please advise as to whether the patient may come off their therapy of Eliquis  5 mg  2 days prior to their procedure which is scheduled for 03/11/2024.  Please route your response to Daphne Moats, RN or fax response to [phone].  Sincerely,    [facility]

## 2024-02-12 NOTE — Telephone Encounter (Signed)
 Clearance received to hold Eliquis  x2 days before colonoscopy. Pt has pre-visit appt scheduled for 10/17.

## 2024-02-12 NOTE — Telephone Encounter (Signed)
 Patient returning phone call. Please advise, thank you

## 2024-02-12 NOTE — Telephone Encounter (Signed)
 Attempted to reach pt to discuss Eliquis . No answer, left vm for pt to return call. Per EPIC, pt's PCP prescribes medication. Will send a pharmacy clearance for approval to hold x2 days per protocol.

## 2024-02-12 NOTE — Telephone Encounter (Signed)
 My patient has a history of pulmonary embolism and is maintained on apixaban  (Eliquis ) 5 mg twice daily. Per current guidelines, apixaban  can be safely discontinued 48 hours prior to a colonoscopy if biopsy or polypectomy is anticipated. Bridging anticoagulation is not recommended. Medication may be resumed 24-48 hours post-procedure once adequate hemostasis is confirmed.  Patient may stop the Eliquis  2 days prior to procedure. Please let me know if there are any further questions.   Saddie JULIANNA Sacks, PA-C

## 2024-02-12 NOTE — Telephone Encounter (Signed)
 It appears this patient is currently taking ELIQUIS  for a hx of PE.  Please obtain a hold for this medication as the patient has a PV scheduled in rm [facility] on 02/26/2024. Please/thank you Bre, PV RN

## 2024-02-12 NOTE — Telephone Encounter (Signed)
 Spoke with pt. Discussed that we have pt's hold Eliquis  for 2 days prior to colonoscopy. Pt states, Oh I figured that, I hold it all the time for surgeries. Advised pt that we are required to get clearance from the prescribing provider prior to holding medication for colonoscopy. Pt reports that her PCP prescribes this medication. Request sent to PCP for clearance to hold x2 days.

## 2024-02-12 NOTE — Telephone Encounter (Signed)
 Eliquis  clearance request sent to PCP.

## 2024-02-15 ENCOUNTER — Encounter: Admitting: Gastroenterology

## 2024-02-15 NOTE — Telephone Encounter (Signed)
 Noted information on PV chart

## 2024-02-17 ENCOUNTER — Telehealth: Payer: Self-pay | Admitting: *Deleted

## 2024-02-17 NOTE — Telephone Encounter (Signed)
  [name] May 07, 1961 [id number]  02/17/24   Dear Saddie Sacks, PA:  We have scheduled the above named patient for a(n) colonoscopy procedure. Our records show that (s)he is on anticoagulation therapy.  Please advise as to whether the patient may come off their therapy of Eliquis  2 days prior to their procedure which is scheduled for 03/11/24.  Please route your response to Powell Misty, CMA or fax response to [phone].  Sincerely,   Powell Misty, [facility]

## 2024-02-18 NOTE — Telephone Encounter (Signed)
 Left message for patient to call office.

## 2024-02-19 NOTE — Telephone Encounter (Signed)
Patient informed she may hold Eliquis.

## 2024-02-23 ENCOUNTER — Other Ambulatory Visit: Payer: Self-pay

## 2024-02-23 DIAGNOSIS — I10 Essential (primary) hypertension: Secondary | ICD-10-CM

## 2024-02-26 ENCOUNTER — Other Ambulatory Visit: Payer: Self-pay | Admitting: Adult Health

## 2024-02-26 ENCOUNTER — Ambulatory Visit

## 2024-02-26 VITALS — Ht 63.0 in | Wt 210.0 lb

## 2024-02-26 DIAGNOSIS — K5900 Constipation, unspecified: Secondary | ICD-10-CM

## 2024-02-26 DIAGNOSIS — Z853 Personal history of malignant neoplasm of breast: Secondary | ICD-10-CM

## 2024-02-26 DIAGNOSIS — Z860100 Personal history of colon polyps, unspecified: Secondary | ICD-10-CM

## 2024-02-26 MED ORDER — NA SULFATE-K SULFATE-MG SULF 17.5-3.13-1.6 GM/177ML PO SOLN: 1.0000 | Freq: Once | ORAL | 0 refills | Status: AC

## 2024-02-26 NOTE — Progress Notes (Signed)
 No egg or soy allergy known to patient  No issues known to pt with past sedation with any surgeries or procedures Patient denies ever being told they had issues or difficulty with intubation  No FH of Malignant Hyperthermia Pt is not on diet pills Pt is not on  home 02   Pt is not on blood thinners-Yes, Eliquis   Pt denies issues with constipation- Yes   No A fib or A flutter Have any cardiac testing pending--NO Pt can ambulate-independently Pt denies use of chewing tobacco Discussed diabetic I weight loss medication holds Discussed NSAID holds Checked BMI Pt instructed to use [employer] or GoodRx for a price reduction on prep  Patient's chart reviewed by Norleen Schillings CNRA prior to previsit and patient appropriate for the [facility].  Pre visit completed and red dot placed by patient's name on their procedure day (on provider's schedule).

## 2024-03-08 ENCOUNTER — Encounter: Payer: Self-pay | Admitting: Dermatology

## 2024-03-08 ENCOUNTER — Ambulatory Visit (INDEPENDENT_AMBULATORY_CARE_PROVIDER_SITE_OTHER): Admitting: Dermatology

## 2024-03-08 DIAGNOSIS — L821 Other seborrheic keratosis: Secondary | ICD-10-CM

## 2024-03-08 DIAGNOSIS — D225 Melanocytic nevi of trunk: Secondary | ICD-10-CM | POA: Diagnosis not present

## 2024-03-08 DIAGNOSIS — D229 Melanocytic nevi, unspecified: Secondary | ICD-10-CM

## 2024-03-08 DIAGNOSIS — D485 Neoplasm of uncertain behavior of skin: Secondary | ICD-10-CM

## 2024-03-08 DIAGNOSIS — Z1283 Encounter for screening for malignant neoplasm of skin: Secondary | ICD-10-CM

## 2024-03-08 DIAGNOSIS — W908XXA Exposure to other nonionizing radiation, initial encounter: Secondary | ICD-10-CM | POA: Diagnosis not present

## 2024-03-08 DIAGNOSIS — L814 Other melanin hyperpigmentation: Secondary | ICD-10-CM

## 2024-03-08 DIAGNOSIS — L578 Other skin changes due to chronic exposure to nonionizing radiation: Secondary | ICD-10-CM

## 2024-03-08 DIAGNOSIS — D1801 Hemangioma of skin and subcutaneous tissue: Secondary | ICD-10-CM

## 2024-03-08 NOTE — Patient Instructions (Signed)

## 2024-03-08 NOTE — Progress Notes (Unsigned)
 Follow-Up Visit   Subjective  [name] is a 63 y.o. female who presents for the following: Skin Cancer Screening and Full Body Skin Exam  The patient presents for Total-Body Skin Exam (TBSE) for skin cancer screening and mole check. The patient has spots, moles and lesions to be evaluated, some may be new or changing.  Pt has no hx of skin cancer. Possible family hx of mm Sister recently passed away from metastatic melanoma this summer.   The following portions of the chart were reviewed this encounter and updated as appropriate: medications, allergies, medical history  Review of Systems:  No other skin or systemic complaints except as noted in HPI or Assessment and Plan.  Objective  Well appearing patient in no apparent distress; mood and affect are within normal limits.  A full examination was performed including scalp, head, eyes, ears, nose, lips, neck, chest, axillae, abdomen, back, buttocks, bilateral upper extremities, bilateral lower extremities, hands, feet, fingers, toes, fingernails, and toenails. All findings within normal limits unless otherwise noted below.   Relevant physical exam findings are noted in the Assessment and Plan.  Right Abdomen 1.2 cm irregular brown macule   Assessment & Plan   SKIN CANCER SCREENING PERFORMED TODAY.  ACTINIC DAMAGE - Chronic condition, secondary to cumulative UV/sun exposure - diffuse scaly erythematous macules with underlying dyspigmentation - Recommend daily broad spectrum sunscreen SPF 30+ to sun-exposed areas, reapply every 2 hours as needed.  - Staying in the shade or wearing long sleeves, sun glasses (UVA+UVB protection) and wide brim hats (4-inch brim around the entire circumference of the hat) are also recommended for sun protection.  - Call for new or changing lesions.  MELANOCYTIC NEVI - Tan-brown and/or pink-flesh-colored symmetric macules and papules - Benign appearing on exam today - Observation - Call clinic  for new or changing moles - Recommend daily use of broad spectrum spf 30+ sunscreen to sun-exposed areas.   LENTIGINES Exam: scattered tan macules Due to sun exposure Treatment Plan: Benign-appearing, observe. Recommend daily broad spectrum sunscreen SPF 30+ to sun-exposed areas, reapply every 2 hours as needed.  Call for any changes   HEMANGIOMA Exam: red papule(s) Discussed benign nature. Recommend observation. Call for changes.   SEBORRHEIC KERATOSIS - Stuck-on, waxy, tan-brown papules and/or plaques  - Benign-appearing - Discussed benign etiology and prognosis. - Observe - Call for any changes  NEOPLASM OF UNCERTAIN BEHAVIOR OF SKIN Right Abdomen Skin / nail biopsy Type of biopsy: tangential   Informed consent: discussed and consent obtained   Timeout: patient name, date of birth, surgical site, and procedure verified   Procedure prep:  Patient was prepped and draped in usual sterile fashion Prep type:  Isopropyl alcohol Anesthesia: the lesion was anesthetized in a standard fashion   Anesthetic:  1% lidocaine  w/ epinephrine  1-100,000 buffered w/ 8.4% NaHCO3 Instrument used: DermaBlade   Hemostasis achieved with: aluminum chloride   Outcome: patient tolerated procedure well   Post-procedure details: sterile dressing applied and wound care instructions given   Dressing type: bandage and pressure dressing    Specimen 1 - Surgical pathology Differential Diagnosis: R/O MM vs SK  Check Margins: No ACTINIC SKIN DAMAGE   MULTIPLE BENIGN NEVI   CHERRY ANGIOMA   SEBORRHEIC KERATOSES   LENTIGINES   Return in about 6 months (around 09/06/2024) for TBSE.  I, Darice Smock, CMA, am acting as scribe for RUFUS CHRISTELLA HOLY, MD.   Documentation: I have reviewed the above documentation for accuracy and completeness, and  I agree with the above.  RUFUS CHRISTELLA HOLY, MD

## 2024-03-10 LAB — SURGICAL PATHOLOGY

## 2024-03-11 ENCOUNTER — Ambulatory Visit: Admitting: Gastroenterology

## 2024-03-11 ENCOUNTER — Encounter: Payer: Self-pay | Admitting: Gastroenterology

## 2024-03-11 ENCOUNTER — Other Ambulatory Visit: Payer: Self-pay

## 2024-03-11 ENCOUNTER — Ambulatory Visit: Payer: Self-pay | Admitting: Dermatology

## 2024-03-11 VITALS — BP 193/86 | HR 68 | Temp 97.9°F | Resp 21 | Ht 63.0 in | Wt 210.0 lb

## 2024-03-11 DIAGNOSIS — Z860101 Personal history of adenomatous and serrated colon polyps: Secondary | ICD-10-CM | POA: Diagnosis not present

## 2024-03-11 DIAGNOSIS — D122 Benign neoplasm of ascending colon: Secondary | ICD-10-CM

## 2024-03-11 DIAGNOSIS — K644 Residual hemorrhoidal skin tags: Secondary | ICD-10-CM

## 2024-03-11 DIAGNOSIS — D124 Benign neoplasm of descending colon: Secondary | ICD-10-CM

## 2024-03-11 DIAGNOSIS — Z860100 Personal history of colon polyps, unspecified: Secondary | ICD-10-CM

## 2024-03-11 DIAGNOSIS — K648 Other hemorrhoids: Secondary | ICD-10-CM | POA: Diagnosis not present

## 2024-03-11 DIAGNOSIS — Z1211 Encounter for screening for malignant neoplasm of colon: Secondary | ICD-10-CM | POA: Diagnosis present

## 2024-03-11 DIAGNOSIS — I10 Essential (primary) hypertension: Secondary | ICD-10-CM

## 2024-03-11 MED ORDER — SODIUM CHLORIDE 0.9 % IV SOLN: 500.0000 mL | Freq: Once | INTRAVENOUS | Status: DC

## 2024-03-11 NOTE — Progress Notes (Signed)
 Called to room to assist during endoscopic procedure.  Patient ID and intended procedure confirmed with present staff. Received instructions for my participation in the procedure from the performing physician.

## 2024-03-11 NOTE — Op Note (Addendum)
  [facility] Patient Name: [name] Procedure Date: 03/11/2024 1:45 PM MRN: [id number] Endoscopist: Gustav ALONSO Mcgee , MD, [id number] Age: 63 Referring MD:  Date of Birth: 1960-08-02 Gender: Female Account #: [id number] Procedure:                Colonoscopy Indications:              High risk colon cancer surveillance: Personal                            history of multiple (3 or more) adenomas, High risk                            colon cancer surveillance: Personal history of                            adenoma less than 10 mm in size, High risk colon                            cancer surveillance: Personal history of sessile                            serrated colon polyp (less than 10 mm in size) with                            no dysplasia Medicines:                Monitored Anesthesia Care Procedure:                Pre-Anesthesia Assessment:                           - Prior to the procedure, a History and Physical                            was performed, and patient medications and                            allergies were reviewed. The patient's tolerance of                            previous anesthesia was also reviewed. The risks                            and benefits of the procedure and the sedation                            options and risks were discussed with the patient.                            All questions were answered, and informed consent                            was obtained. Prior Anticoagulants: The patient  last took Eliquis  (apixaban ) 2 days prior to the                            procedure. ASA Grade Assessment: II - A patient                            with mild systemic disease. After reviewing the                            risks and benefits, the patient was deemed in                            satisfactory condition to undergo the procedure.                           After obtaining informed consent, the  colonoscope                            was passed under direct vision. Throughout the                            procedure, the patient's blood pressure, pulse, and                            oxygen saturations were monitored continuously. The                            Olympus Scope [id number] was introduced through the                            anus and advanced to the the cecum, identified by                            appendiceal orifice and ileocecal valve. The                            colonoscopy was performed without difficulty. The                            patient tolerated the procedure well. The quality                            of the bowel preparation was adequate to identify                            polyps greater than 5 mm in size. The ileocecal                            valve, appendiceal orifice, and rectum were                            photographed. Scope In: 2:09:06 PM Scope Out: 2:27:27 PM Scope Withdrawal Time: 0 hours 14 minutes 16 seconds  Total Procedure Duration: 0 hours 18 minutes 21 seconds  Findings:                 The perianal and digital rectal examinations were                            normal.                           Two sessile polyps were found in the descending                            colon and ascending colon. The polyps were 4 to 10                            mm in size. These polyps were removed with a cold                            snare. Resection and retrieval were complete.                           Non-bleeding external and internal hemorrhoids were                            found during retroflexion. The hemorrhoids were                            small. Complications:            No immediate complications. Estimated Blood Loss:     Estimated blood loss was minimal. Impression:               - Two 4 to 10 mm polyps in the descending colon and                            in the ascending colon, removed with a cold snare.                             Resected and retrieved.                           - Non-bleeding external and internal hemorrhoids. Recommendation:           - Patient has a contact number available for                            emergencies. The signs and symptoms of potential                            delayed complications were discussed with the                            patient. Return to normal activities tomorrow.                            Written discharge instructions were provided  to the                            patient.                           - Resume previous diet.                           - Continue present medications.                           - Await pathology results.                           - Repeat colonoscopy in 3 years for surveillance.                           - For future colonoscopy the patient will require                            an extended preparation. If there are any                            questions, please contact the gastroenterologist.                           - Resume Eliquis  (apixaban ) at prior dose tomorrow.                            Refer to managing physician for further adjustment                            of therapy. Brandi Mcnatt V. Zahria Ding, MD 03/11/2024 2:36:23 PM This report has been signed electronically.

## 2024-03-11 NOTE — Progress Notes (Signed)
 [facility] History and Physical   Primary Care Physician:  Gayle Saddie FALCON, PA-C   Reason for Procedure:  History of adenomatous colon polyps  Plan:    Surveillance colonoscopy with possible interventions as needed     HPI: [name] is a very pleasant 63 y.o. female here for surveillance colonoscopy. Denies any nausea, vomiting, abdominal pain, melena or bright red blood per rectum  The risks and benefits as well as alternatives of endoscopic procedure(s) have been discussed and reviewed.  The patient was provided an opportunity to ask questions and all were answered. The patient agreed with the plan and demonstrated an understanding of the instructions.   Past Medical History:  Diagnosis Date   Acid reflux    takes Zantac  and Omeprazole  daily   Anemia    Anxiety    takes Citaopram daily   Arthritis    right knee   Arthrosis    left thumb [facility]   Breast cancer (HCC)    Chronic back pain    DDD   Clotting disorder    hx of blood clot following knee scope, pt reports hx blood clot in her lung   Depression    Dyspnea on exertion    Fibromyalgia    History of bronchitis 3+yrs ago   Hyperlipidemia    takes Atorvastatin  daily   Hypertension    takes Lisinopril  and HCTZ daily   Insomnia    takes Elavil  nightly as needed   Paraesophageal hernia    Personal history of radiation therapy     Past Surgical History:  Procedure Laterality Date   BREAST BIOPSY Left    BREAST LUMPECTOMY     BREAST LUMPECTOMY WITH RADIOACTIVE SEED AND SENTINEL LYMPH NODE BIOPSY Right 06/03/2021   Procedure: RIGHT BREAST LUMPECTOMY WITH RADIOACTIVE SEED AND SENTINEL LYMPH NODE BIOPSY;  Surgeon: Curvin Deward MOULD, MD;  Location: [facility];  Service: General;  Laterality: Right;   BUNIONECTOMY Right 02/18/2018   Procedure: DOY EDWARDS;  Surgeon: Janit Thresa CHRISTELLA, DPM;  Location: [facility];  Service: Podiatry;  Laterality: Right;   CAPSULOTOMY Bilateral 02/18/2018    Procedure: CAPSULOTOMY MPJ RELEASE JOINT 2N BILATERAL;  Surgeon: Janit Thresa CHRISTELLA, DPM;  Location: [facility];  Service: Podiatry;  Laterality: Bilateral;   CARPOMETACARPEL SUSPENSION PLASTY Left 01/27/2018   Procedure: LEFT THUMB ligament reconstruction and tendon interposition;  Surgeon: Jerri Kay CHRISTELLA, MD;  Location: [facility];  Service: Orthopedics;  Laterality: Left;   CARPOMETACARPEL SUSPENSION PLASTY Left 03/07/2020   Procedure: REVISION LEFT THUMB CARPOMETACARPAL (CMC) ARTHROPLASTY;  Surgeon: Jerri Kay CHRISTELLA, MD;  Location: [facility];  Service: Orthopedics;  Laterality: Left;   CHOLECYSTECTOMY N/A 07/29/2022   Procedure: LAPAROSCOPIC CHOLECYSTECTOMY WITH  ICG DYE;  Surgeon: Ebbie Cough, MD;  Location: [facility] OR;  Service: General;  Laterality: N/A;  RNFA  ICG DYE  TAP BLOCK   CHONDROPLASTY Right 06/28/2014   Procedure: CHONDROPLASTY;  Surgeon: Kay Ozell Jerri, MD;  Location: [facility];  Service: Orthopedics;  Laterality: Right;   COLONOSCOPY N/A 05/29/2014   Procedure: COLONOSCOPY;  Surgeon: Lamar CHRISTELLA Hollingshead, MD;  Location: [facility];  Service: Endoscopy;  Laterality: N/A;  215pm- Pt is working until 12:00 so she can't come any earlier   ESOPHAGOGASTRODUODENOSCOPY N/A 05/29/2014   Procedure: ESOPHAGOGASTRODUODENOSCOPY (EGD);  Surgeon: Lamar CHRISTELLA Hollingshead, MD;  [facility]: [facility];  Service: Endoscopy;  Laterality: N/A;   ESOPHAGOGASTRODUODENOSCOPY N/A 12/17/2020   Procedure: ESOPHAGOGASTRODUODENOSCOPY (EGD);  Surgeon: Shyrl Linnie KIDD, MD;  Location: [facility] OR;  Service: Thoracic;  Laterality: N/A;   ESOPHAGOGASTRODUODENOSCOPY N/A 06/12/2022   Procedure: ESOPHAGOGASTRODUODENOSCOPY (EGD);  Surgeon: Shyrl Linnie KIDD, MD;  Location: [facility];  Service: Thoracic;  Laterality: N/A;   ESOPHAGOGASTRODUODENOSCOPY N/A 11/19/2022   Procedure: ESOPHAGOGASTRODUODENOSCOPY (EGD) WITH SAVARY DILATION;  Surgeon: Shyrl Linnie KIDD, MD;  Location: [facility];   Service: Thoracic;  Laterality: N/A;   GANGLION CYST EXCISION Left 01/13/2002   HAMMER TOE SURGERY Bilateral 02/18/2018   Procedure: HAMMER TOE CORRECTION2ND BILATERAL;  Surgeon: Janit Thresa HERO, DPM;  Location: [facility];  Service: Podiatry;  Laterality: Bilateral;   HERNIA REPAIR Bilateral    IRRIGATION AND DEBRIDEMENT ABSCESS Right 09/08/2021   Procedure: IRRIGATION AND DEBRIDEMENT RIGHT BREAST ABSCESS;  Surgeon: Belinda Cough, MD;  Location: [facility];  Service: General;  Laterality: Right;   KNEE ARTHROSCOPY WITH MEDIAL MENISECTOMY Right 06/28/2014   Procedure: RIGHT KNEE ARTHROSCOPY WITH PARTIAL MEDIAL MENISCECTOMY AND CHONDROPLASTY;  Surgeon: Kay Ozell Cummins, MD;  Location: [facility];  Service: Orthopedics;  Laterality: Right;   MALONEY DILATION N/A 05/29/2014   Procedure: AGAPITO DILATION;  Surgeon: Lamar HERO Hollingshead, MD;  [facility];  Service: Endoscopy;  Laterality: N/A;   PARTIAL KNEE ARTHROPLASTY Right 09/15/2014   Procedure: RIGHT UNICOMPARTMENTAL KNEE ARTHROPLASTY;  Surgeon: Kay HERO Cummins, MD;  Location: [facility];  Service: Orthopedics;  Laterality: Right;   SHOULDER ARTHROSCOPY Right    TOTAL KNEE ARTHROPLASTY Left 04/08/2004   XI ROBOTIC ASSISTED HIATAL HERNIA REPAIR N/A 06/12/2022   Procedure: XI ROBOTIC ASSISTED HIATAL HERNIA REPAIR;  Surgeon: Shyrl Linnie KIDD, MD;  Location: [facility];  Service: Thoracic;  Laterality: N/A;   XI ROBOTIC ASSISTED PARAESOPHAGEAL HERNIA REPAIR N/A 12/17/2020   Procedure: XI ROBOTIC ASSISTED Laparoscopy PARAESOPHAGEAL HERNIA REPAIR WITH FUNDOPLICATION;  Surgeon: Shyrl Linnie KIDD, MD;  Location: [facility];  Service: Thoracic;  Laterality: N/A;    Prior to Admission medications   Medication Sig Start Date End Date Taking? Authorizing Provider  amLODipine  (NORVASC ) 5 MG tablet Take 1 tablet by mouth once daily 03/11/24  Yes Clapp, Kara F, PA-C  buPROPion  (WELLBUTRIN  SR) 150 MG 12 hr tablet TAKE 1 TABLET BY MOUTH IN THE MORNING  09/18/23  Yes Chandra Toribio POUR, MD  cloNIDine  (CATAPRES ) 0.1 MG tablet Take 1 tablet by mouth twice daily 01/12/24  Yes Clapp, Kara F, PA-C  FLUoxetine  (PROZAC ) 20 MG capsule Take 2 capsules (40 mg total) by mouth daily. 09/16/23  Yes Chandra Toribio POUR, MD  metoprolol  succinate (TOPROL -XL) 25 MG 24 hr tablet TAKE 1 TABLET BY MOUTH ONCE DAILY *  SPLIT  TABLET  IN  HALF  PRIOR  TO  TAKING  * 02/23/24  Yes Clapp, Kara F, PA-C  ELIQUIS  5 MG TABS tablet TAKE 1 TABLET BY MOUTH TWICE DAILY. CRUSH TABLET BEFORE TAKING. 02/08/24   Gayle Numbers F, PA-C  ondansetron  (ZOFRAN ) 4 MG tablet Take 1 tablet (4 mg total) by mouth every 8 (eight) hours as needed for nausea or vomiting. Patient not taking: Reported on 03/11/2024 07/13/23   Wallace Joesph LABOR, PA    Current Outpatient Medications  Medication Sig Dispense Refill   amLODipine  (NORVASC ) 5 MG tablet Take 1 tablet by mouth once daily 90 tablet 0   buPROPion  (WELLBUTRIN  SR) 150 MG 12 hr tablet TAKE 1 TABLET BY MOUTH IN THE MORNING 90 tablet 1   cloNIDine  (CATAPRES ) 0.1 MG tablet Take 1 tablet by mouth twice daily 180 tablet  0   FLUoxetine  (PROZAC ) 20 MG capsule Take 2 capsules (40 mg total) by mouth daily. 180 capsule 1   metoprolol  succinate (TOPROL -XL) 25 MG 24 hr tablet TAKE 1 TABLET BY MOUTH ONCE DAILY *  SPLIT  TABLET  IN  HALF  PRIOR  TO  TAKING  * 90 tablet 0   ELIQUIS  5 MG TABS tablet TAKE 1 TABLET BY MOUTH TWICE DAILY. CRUSH TABLET BEFORE TAKING. 60 tablet 0   ondansetron  (ZOFRAN ) 4 MG tablet Take 1 tablet (4 mg total) by mouth every 8 (eight) hours as needed for nausea or vomiting. (Patient not taking: Reported on 03/11/2024) 20 tablet 3   Current Facility-Administered Medications  Medication Dose Route Frequency Provider Last Rate Last Admin   0.9 %  sodium chloride  infusion  500 mL Intravenous Once Cylie Dor V, MD        Allergies as of 03/11/2024 - Review Complete 03/11/2024  Allergen Reaction Noted   Tramadol  Itching 07/02/2016    Family  History  Problem Relation Age of Onset   Stroke Mother    Lung cancer Father 32   GI problems Father    Prostate cancer Father    Cancer Sister        breast   Breast cancer Sister 65   Lung cancer Maternal Grandfather    Lung cancer Paternal Grandfather    Diabetes Son    Colon cancer Neg Hx    Esophageal cancer Neg Hx    Pancreatic cancer Neg Hx    Stomach cancer Neg Hx    Rectal cancer Neg Hx     Social History   Socioeconomic History   Marital status: Divorced    Spouse name: Not on file   Number of children: 5   Years of education: Not on file   Highest education level: Not on file  Occupational History   Occupation: conservation officer, nature  Tobacco Use   Smoking status: Never    Passive exposure: Never   Smokeless tobacco: Never  Vaping Use   Vaping status: Never Used  Substance and Sexual Activity   Alcohol use: No    Alcohol/week: 0.0 standard drinks of alcohol   Drug use: No   Sexual activity: Not Currently    Birth control/protection: None, Post-menopausal  Other Topics Concern   Not on file  Social History Narrative   Right Handed    Lives in a one story home    Social Drivers of Health   Financial Resource Strain: Low Risk  (01/07/2024)   Overall Financial Resource Strain (CARDIA)    Difficulty of Paying Living Expenses: Not hard at all  Food Insecurity: No Food Insecurity (01/07/2024)   Hunger Vital Sign    Worried About Running Out of Food in the Last Year: Never true    Ran Out of Food in the Last Year: Never true  Transportation Needs: No Transportation Needs (01/07/2024)   PRAPARE - Administrator, Civil Service (Medical): No    Lack of Transportation (Non-Medical): No  Physical Activity: Inactive (01/07/2024)   Exercise Vital Sign    Days of Exercise per Week: 0 days    Minutes of Exercise per Session: 0 min  Stress: Stress Concern Present (01/07/2024)   [employer] of Occupational Health - Occupational Stress Questionnaire    Feeling of  Stress: To some extent  Social Connections: Socially Isolated (01/07/2024)   Social Connection and Isolation Panel    Frequency of Communication with Friends and Family:  More than three times a week    Frequency of Social Gatherings with Friends and Family: Once a week    Attends Religious Services: Never    Database Administrator or Organizations: No    Attends Banker Meetings: Never    Marital Status: Divorced  Catering Manager Violence: Not At Risk (01/07/2024)   Humiliation, Afraid, Rape, and Kick questionnaire    Fear of Current or Ex-Partner: No    Emotionally Abused: No    Physically Abused: No    Sexually Abused: No    Review of Systems:  All other review of systems negative except as mentioned in the HPI.  Physical Exam: Vital signs in last 24 hours: BP (!) 163/106   Pulse 78   Temp 97.9 F (36.6 C)   Ht 5' 3 (1.6 m)   Wt 210 lb (95.3 kg)   LMP 02/07/2013   SpO2 97%   BMI 37.20 kg/m  General:   Alert, NAD Lungs:  Clear .   Heart:  Regular rate and rhythm Abdomen:  Soft, nontender and nondistended. Neuro/Psych:  Alert and cooperative. Normal mood and affect. A and O x 3  Reviewed labs, radiology imaging, old records and pertinent past GI work up  Patient is appropriate for planned procedure(s) and anesthesia in an ambulatory setting   K. Veena Mattew Chriswell , MD [phone]

## 2024-03-11 NOTE — Progress Notes (Signed)
To pacu VSS. Report to RN.tb ?

## 2024-03-11 NOTE — Progress Notes (Signed)
 Pt's states no medical or surgical changes since previsit or office visit.

## 2024-03-11 NOTE — Patient Instructions (Signed)
 YOU HAD AN ENDOSCOPIC PROCEDURE TODAY AT THE [facility]:   Refer to the procedure report that was given to you for any specific questions about what was found during the examination.  If the procedure report does not answer your questions, please call your gastroenterologist to clarify.  If you requested that your care partner not be given the details of your procedure findings, then the procedure report has been included in a sealed envelope for you to review at your convenience later.  YOU SHOULD EXPECT: Some feelings of bloating in the abdomen. Passage of more gas than usual.  Walking can help get rid of the air that was put into your GI tract during the procedure and reduce the bloating. If you had a lower endoscopy (such as a colonoscopy or flexible sigmoidoscopy) you may notice spotting of blood in your stool or on the toilet paper. If you underwent a bowel prep for your procedure, you may not have a normal bowel movement for a few days.  Please Note:  You might notice some irritation and congestion in your nose or some drainage.  This is from the oxygen used during your procedure.  There is no need for concern and it should clear up in a day or so.  SYMPTOMS TO REPORT IMMEDIATELY:  Following lower endoscopy (colonoscopy or flexible sigmoidoscopy):  Excessive amounts of blood in the stool  Significant tenderness or worsening of abdominal pains  Swelling of the abdomen that is new, acute  Fever of 100F or higher  Resume previous diet Continue present medications Await pathology results Repeat colonoscopy in 3 years for surveillance Handouts on polyps and hemorrhoids given   For urgent or emergent issues, a gastroenterologist can be reached at any hour by calling [phone]. Do not use MyChart messaging for urgent concerns.    DIET:  We do recommend a small meal at first, but then you may proceed to your regular diet.  Drink plenty of fluids but you should avoid  alcoholic beverages for 24 hours.  ACTIVITY:  You should plan to take it easy for the rest of today and you should NOT DRIVE or use heavy machinery until tomorrow (because of the sedation medicines used during the test).    FOLLOW UP: Our staff will call the number listed on your records the next business day following your procedure.  We will call around 7:15- 8:00 am to check on you and address any questions or concerns that you may have regarding the information given to you following your procedure. If we do not reach you, we will leave a message.     If any biopsies were taken you will be contacted by phone or by letter within the next 1-3 weeks.  Please call us  at [phone] if you have not heard about the biopsies in 3 weeks.    SIGNATURES/CONFIDENTIALITY: You and/or your care partner have signed paperwork which will be entered into your electronic medical record.  These signatures attest to the fact that that the information above on your After Visit Summary has been reviewed and is understood.  Full responsibility of the confidentiality of this discharge information lies with you and/or your care-partner.

## 2024-03-14 ENCOUNTER — Encounter: Payer: Self-pay | Admitting: Radiology

## 2024-03-14 ENCOUNTER — Telehealth: Payer: Self-pay

## 2024-03-14 NOTE — Telephone Encounter (Signed)
 Attempted f/u call. No answer, left VM.

## 2024-03-16 LAB — SURGICAL PATHOLOGY

## 2024-03-20 ENCOUNTER — Other Ambulatory Visit: Payer: Self-pay | Admitting: Family Medicine

## 2024-03-20 DIAGNOSIS — F32A Depression, unspecified: Secondary | ICD-10-CM

## 2024-03-23 ENCOUNTER — Ambulatory Visit: Payer: Self-pay | Admitting: Gastroenterology

## 2024-03-28 ENCOUNTER — Ambulatory Visit
Admission: RE | Admit: 2024-03-28 | Discharge: 2024-03-28 | Disposition: A | Discharge status: Home / Self Care | Source: Ambulatory Visit | Attending: Adult Health | Admitting: Adult Health

## 2024-03-28 DIAGNOSIS — Z853 Personal history of malignant neoplasm of breast: Secondary | ICD-10-CM

## 2024-04-09 ENCOUNTER — Other Ambulatory Visit: Payer: Self-pay

## 2024-04-09 DIAGNOSIS — I10 Essential (primary) hypertension: Secondary | ICD-10-CM

## 2024-04-12 ENCOUNTER — Other Ambulatory Visit: Payer: Self-pay

## 2024-04-12 DIAGNOSIS — I2699 Other pulmonary embolism without acute cor pulmonale: Secondary | ICD-10-CM

## 2024-05-08 ENCOUNTER — Other Ambulatory Visit: Payer: Self-pay

## 2024-05-08 DIAGNOSIS — I2699 Other pulmonary embolism without acute cor pulmonale: Secondary | ICD-10-CM

## 2024-05-16 ENCOUNTER — Ambulatory Visit (INDEPENDENT_AMBULATORY_CARE_PROVIDER_SITE_OTHER)

## 2024-05-16 VITALS — BP 120/82 | HR 75 | Temp 98.3°F | Ht 63.0 in | Wt 222.0 lb

## 2024-05-16 DIAGNOSIS — Z86711 Personal history of pulmonary embolism: Secondary | ICD-10-CM | POA: Diagnosis not present

## 2024-05-16 DIAGNOSIS — I340 Nonrheumatic mitral (valve) insufficiency: Secondary | ICD-10-CM | POA: Diagnosis not present

## 2024-05-16 DIAGNOSIS — M8580 Other specified disorders of bone density and structure, unspecified site: Secondary | ICD-10-CM

## 2024-05-16 DIAGNOSIS — I10 Essential (primary) hypertension: Secondary | ICD-10-CM | POA: Diagnosis not present

## 2024-05-16 DIAGNOSIS — M069 Rheumatoid arthritis, unspecified: Secondary | ICD-10-CM

## 2024-05-16 DIAGNOSIS — Z8739 Personal history of other diseases of the musculoskeletal system and connective tissue: Secondary | ICD-10-CM | POA: Diagnosis not present

## 2024-05-16 DIAGNOSIS — Z170 Estrogen receptor positive status [ER+]: Secondary | ICD-10-CM

## 2024-05-16 DIAGNOSIS — F322 Major depressive disorder, single episode, severe without psychotic features: Secondary | ICD-10-CM

## 2024-05-16 DIAGNOSIS — C50411 Malignant neoplasm of upper-outer quadrant of right female breast: Secondary | ICD-10-CM | POA: Diagnosis not present

## 2024-05-16 DIAGNOSIS — Z0000 Encounter for general adult medical examination without abnormal findings: Secondary | ICD-10-CM | POA: Diagnosis not present

## 2024-05-16 MED ORDER — GABAPENTIN 100 MG PO CAPS: 100.0000 mg | ORAL_CAPSULE | Freq: Every day | ORAL | 2 refills | Status: AC

## 2024-05-16 MED ORDER — BUPROPION HCL ER (XL) 300 MG PO TB24: 300.0000 mg | ORAL_TABLET | Freq: Every day | ORAL | 1 refills | Status: AC

## 2024-05-16 NOTE — Patient Instructions (Addendum)
 VISIT SUMMARY: During your visit, we discussed your worsening joint pain, difficulty sleeping, and other health concerns. We have made several adjustments to your medications and referred you to specialists for further evaluation.  YOUR PLAN: CHRONIC JOINT PAIN WITH POSSIBLE INFLAMMATORY ARTHRITIS: You have significant joint pain, especially at night, which disrupts your sleep. This may be due to an autoimmune disorder. -You are referred to Dr. Jeannetta at [facility] for further evaluation and management. -Start taking gabapentin  100 mg at bedtime. Increase to 200 mg after one week and 300 mg after another week if tolerated. -A nerve conduction study has been ordered to rule out other causes of pain.  MITRAL VALVE INSUFFICIENCY: You have a history of mild valve leakage in your heart. -A referral to [facility] [address] has been sent for follow-up and to schedule an echocardiogram.  DEPRESSION: You are experiencing low mood, low energy, and lack of interest in activities. Your current dose of Wellbutrin  may be insufficient. -Increase Wellbutrin  to 300 mg extended-release once daily in the morning.  OBESITY: You are having difficulty losing weight despite dietary efforts. -Increase Wellbutrin  to 300 mg extended-release once daily in the morning. -We checked your A1c to assess for potential weight loss treatment options. -We checked your thyroid function to rule out hypothyroidism as a contributing factor.  CHRONIC NAUSEA: You have chronic nausea since your cancer treatment, which is exacerbated by car rides. -We will continue to monitor and adjust your treatment as needed.  OSTEOPENIA: You have early signs of bone loss. -Take calcium  and vitamin D  supplements. Around 400 units vitamin d  and 1000 units calcium   GENERAL HEALTH MAINTENANCE: You are due for routine health screenings. -Schedule a Pap smear in the next couple of months. -Take calcium  and vitamin D   supplements.  If you have any problems before your next visit feel free to message me via MyChart (minor issues or questions) or call the office, otherwise you may reach out to schedule an office visit.  Thank you! Saddie Sacks, PA-C

## 2024-05-17 DIAGNOSIS — Z86711 Personal history of pulmonary embolism: Secondary | ICD-10-CM | POA: Insufficient documentation

## 2024-05-17 DIAGNOSIS — M8580 Other specified disorders of bone density and structure, unspecified site: Secondary | ICD-10-CM | POA: Insufficient documentation

## 2024-05-17 DIAGNOSIS — F322 Major depressive disorder, single episode, severe without psychotic features: Secondary | ICD-10-CM | POA: Insufficient documentation

## 2024-05-17 LAB — LIPID PANEL
Chol/HDL Ratio: 4.6 ratio — ABNORMAL HIGH (ref 0.0–4.4)
Cholesterol, Total: 282 mg/dL — ABNORMAL HIGH (ref 100–199)
HDL: 61 mg/dL
LDL Chol Calc (NIH): 159 mg/dL — ABNORMAL HIGH (ref 0–99)
Triglycerides: 332 mg/dL — ABNORMAL HIGH (ref 0–149)
VLDL Cholesterol Cal: 62 mg/dL — ABNORMAL HIGH (ref 5–40)

## 2024-05-17 LAB — TSH: TSH: 2.08 u[IU]/mL (ref 0.450–4.500)

## 2024-05-17 LAB — COMPREHENSIVE METABOLIC PANEL WITH GFR
ALT: 23 IU/L (ref 0–32)
AST: 23 IU/L (ref 0–40)
Albumin: 4.4 g/dL (ref 3.9–4.9)
Alkaline Phosphatase: 131 IU/L (ref 49–135)
BUN/Creatinine Ratio: 10 — ABNORMAL LOW (ref 12–28)
BUN: 10 mg/dL (ref 8–27)
Bilirubin Total: 0.2 mg/dL (ref 0.0–1.2)
CO2: 22 mmol/L (ref 20–29)
Calcium: 10 mg/dL (ref 8.7–10.3)
Chloride: 100 mmol/L (ref 96–106)
Creatinine, Ser: 0.98 mg/dL (ref 0.57–1.00)
Globulin, Total: 2.5 g/dL (ref 1.5–4.5)
Glucose: 102 mg/dL — ABNORMAL HIGH (ref 70–99)
Potassium: 4.3 mmol/L (ref 3.5–5.2)
Sodium: 138 mmol/L (ref 134–144)
Total Protein: 6.9 g/dL (ref 6.0–8.5)
eGFR: 65 mL/min/1.73

## 2024-05-17 LAB — HEMOGLOBIN A1C
Est. average glucose Bld gHb Est-mCnc: 120 mg/dL
Hgb A1c MFr Bld: 5.8 % — ABNORMAL HIGH (ref 4.8–5.6)

## 2024-05-17 LAB — CBC WITH DIFFERENTIAL/PLATELET
Basophils Absolute: 0.1 x10E3/uL (ref 0.0–0.2)
Basos: 1 %
EOS (ABSOLUTE): 0.1 x10E3/uL (ref 0.0–0.4)
Eos: 1 %
Hematocrit: 44 % (ref 34.0–46.6)
Hemoglobin: 14.7 g/dL (ref 11.1–15.9)
Immature Grans (Abs): 0 x10E3/uL (ref 0.0–0.1)
Immature Granulocytes: 0 %
Lymphocytes Absolute: 1.9 x10E3/uL (ref 0.7–3.1)
Lymphs: 21 %
MCH: 31.1 pg (ref 26.6–33.0)
MCHC: 33.4 g/dL (ref 31.5–35.7)
MCV: 93 fL (ref 79–97)
Monocytes Absolute: 0.7 x10E3/uL (ref 0.1–0.9)
Monocytes: 8 %
Neutrophils Absolute: 5.9 x10E3/uL (ref 1.4–7.0)
Neutrophils: 68 %
Platelets: 375 x10E3/uL (ref 150–450)
RBC: 4.73 x10E6/uL (ref 3.77–5.28)
RDW: 13.6 % (ref 11.7–15.4)
WBC: 8.7 x10E3/uL (ref 3.4–10.8)

## 2024-05-17 LAB — VITAMIN D 25 HYDROXY (VIT D DEFICIENCY, FRACTURES): Vit D, 25-Hydroxy: 15.7 ng/mL — ABNORMAL LOW (ref 30.0–100.0)

## 2024-05-17 NOTE — Assessment & Plan Note (Signed)
 BP goal <130/80 given history of chronic kidney disease.  Continue amlodipine 5 mg daily, clonidine 0.1 mg twice daily.  Continue to monitor closely.  If blood pressure remains above goal, recommend to increase amlodipine dose.  Lisinopril was previously discontinued due to renal decline, and hydrochlorothiazide was discontinued due to hypokalemia.

## 2024-05-17 NOTE — Assessment & Plan Note (Signed)
 Difficulty losing weight despite dietary efforts. Previous weight loss with Wellbutrin  noted. Phentermine not recommended due to cardiac history. - Increased Wellbutrin  to 300 mg extended-release once daily in the morning. - Checked A1c to assess for potential weight loss treatment options. - Checked thyroid function to rule out hypothyroidism as a contributing factor. - May be a good candidate for Topamax for better appetite control pending lab workup

## 2024-05-17 NOTE — Assessment & Plan Note (Signed)
 Hx of PE during chemotherapy. Stable and anticoagulated on Eliquis  5 mg BID.

## 2024-05-17 NOTE — Assessment & Plan Note (Signed)
 Current treatment with Prozac  40 mg and Wellbutrin  SR 150 mg once daily (instead of twice daily). Reports low mood, low energy, and lack of interest in activities. Current Wellbutrin  dose may be insufficient. - Increased Wellbutrin  to 300 mg extended-release once daily in the morning. - If mood does not improve, may consider trial of Abilify/Rexulti or referral to psychiatry.

## 2024-05-17 NOTE — Assessment & Plan Note (Signed)
 Discovered incidentally on ECHO in Feb 2025. Referral placed to cardiology for further workup and regular monitoring.

## 2024-05-17 NOTE — Assessment & Plan Note (Signed)
 Noted on last bone density scan >2 years ago. Recommend starting calcium  1000 and vit d 400 units daily for primary prevention of osteoporosis along with weight bearing exercise as tolerated. Declined repeat bone density today due to not optimizing supplements.

## 2024-05-17 NOTE — Assessment & Plan Note (Signed)
 Diffuse musculoskeletal pain worsening over the last few weeks and interfering with daily life and sleep.  She has not followed up with Rheumatologist in years, new referral placed today to [facility].  - Must avoid NSAIDs due to pt being on Eliquis   - Advised to optimize Tylenol   - Restart Gabapentin  100-300 mg at bedtime for pain control
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
## 2024-05-18 ENCOUNTER — Ambulatory Visit: Payer: Self-pay

## 2024-05-18 DIAGNOSIS — E785 Hyperlipidemia, unspecified: Secondary | ICD-10-CM

## 2024-05-18 MED ORDER — CHOLECALCIFEROL 1.25 MG (50000 UT) PO TABS: 50000.0000 [IU] | ORAL_TABLET | ORAL | 0 refills | Status: AC

## 2024-05-18 MED ORDER — ATORVASTATIN CALCIUM 20 MG PO TABS: 20.0000 mg | ORAL_TABLET | Freq: Every day | ORAL | 1 refills | Status: AC

## 2024-05-28 ENCOUNTER — Other Ambulatory Visit: Payer: Self-pay

## 2024-05-28 DIAGNOSIS — I10 Essential (primary) hypertension: Secondary | ICD-10-CM

## 2024-06-09 ENCOUNTER — Other Ambulatory Visit: Payer: Self-pay

## 2024-06-09 DIAGNOSIS — I2699 Other pulmonary embolism without acute cor pulmonale: Secondary | ICD-10-CM

## 2024-06-13 ENCOUNTER — Inpatient Hospital Stay: Payer: Medicare Other | Admitting: Hematology and Oncology

## 2024-06-22 ENCOUNTER — Ambulatory Visit: Admitting: Cardiology

## 2024-07-13 ENCOUNTER — Inpatient Hospital Stay: Admitting: Hematology and Oncology

## 2024-08-16 ENCOUNTER — Ambulatory Visit

## 2024-09-07 ENCOUNTER — Ambulatory Visit: Admitting: Dermatology

## 2025-02-09 ENCOUNTER — Ambulatory Visit
